# Patient Record
Sex: Male | Born: 1985 | Race: Black or African American | Hispanic: No | Marital: Married | Smoking: Never smoker
Health system: Southern US, Community
[De-identification: ages and names within clinical notes are randomized; demographics above are authoritative.]

## PROBLEM LIST (undated history)

## (undated) DIAGNOSIS — C801 Malignant (primary) neoplasm, unspecified: Secondary | ICD-10-CM

## (undated) DIAGNOSIS — M549 Dorsalgia, unspecified: Secondary | ICD-10-CM

## (undated) DIAGNOSIS — I10 Essential (primary) hypertension: Secondary | ICD-10-CM

## (undated) HISTORY — PX: KNEE SURGERY: SHX244

---
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
## 2005-01-14 ENCOUNTER — Emergency Department (HOSPITAL_COMMUNITY): Admission: EM | Admit: 2005-01-14 | Discharge: 2005-01-14 | Payer: Self-pay | Admitting: Emergency Medicine

## 2005-01-14 IMAGING — CR DG CHEST 2V
2 series · 2 of 2 positions shown · non-contrast
Comparison: none

CLINICAL DATA: Left flank pain on deep inspiration.  Low back pain.
 CHEST ? 2 VIEW:

[view not recorded (1 of 2)]
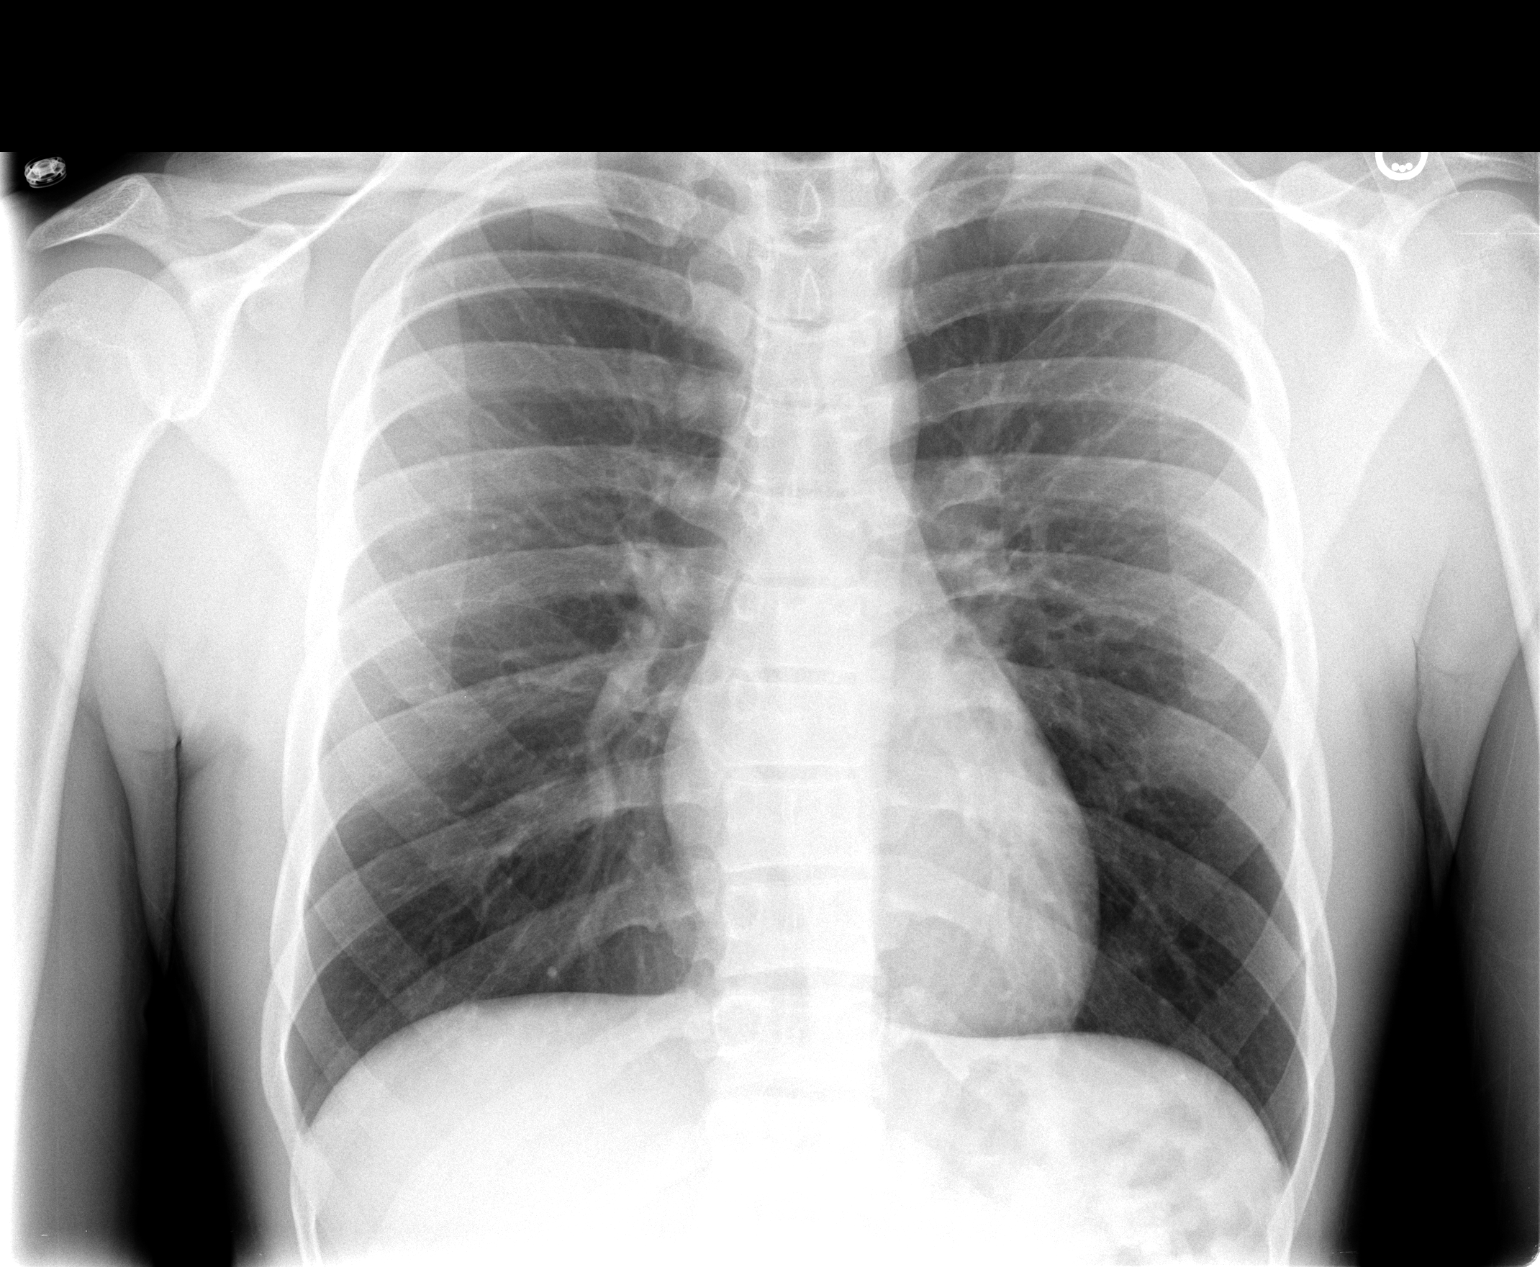

[view not recorded (2 of 2)]
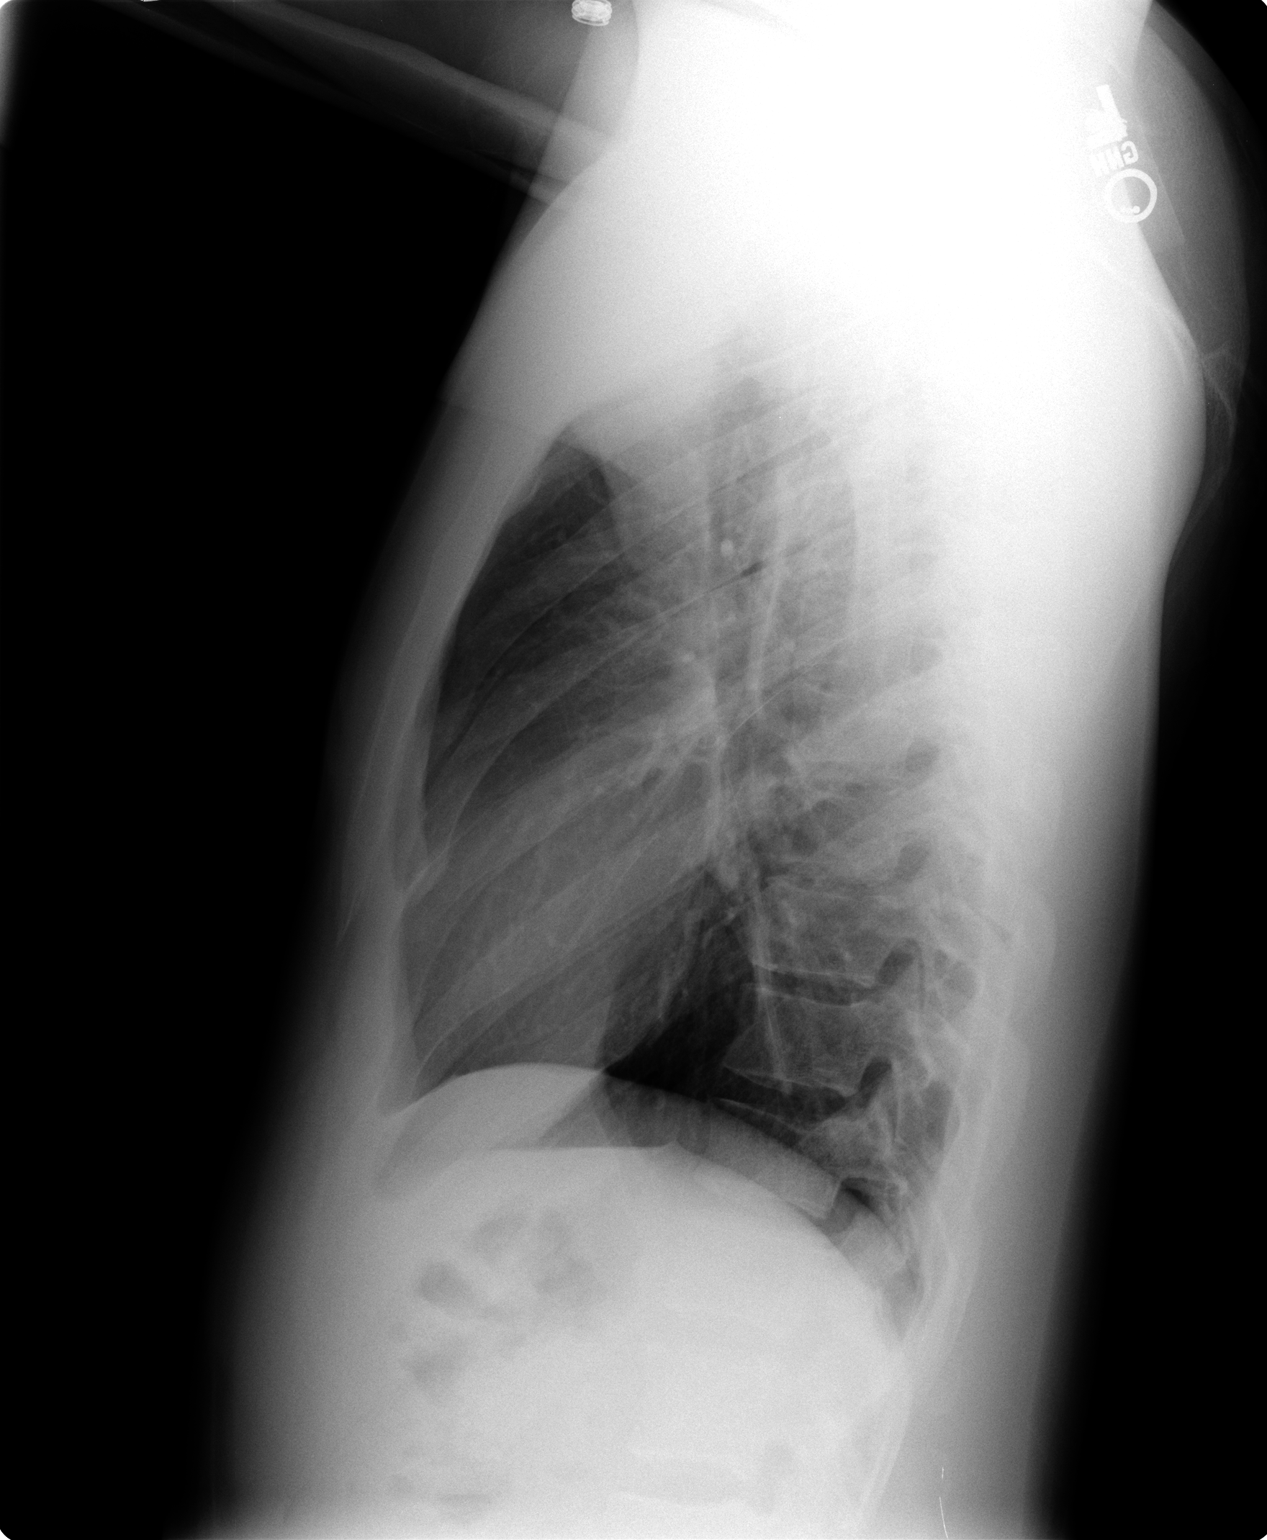

[2 of 2 positions shown; findings below may reference images not displayed]

FINDINGS: PA and lateral views of the chest are made without previous films for comparison and show no evidence of acute disease.  The heart, lungs, bony thorax, and soft tissues appear normal.
IMPRESSION: No evidence of active cardiopulmonary disease.

## 2009-03-14 ENCOUNTER — Emergency Department (HOSPITAL_COMMUNITY): Admission: EM | Admit: 2009-03-14 | Discharge: 2009-03-14 | Payer: Self-pay | Admitting: Emergency Medicine

## 2009-03-14 IMAGING — CR DG HAND COMPLETE 3+V*R*
3 series · 3 of 3 positions shown · non-contrast
Comparison: Right hand x-rays [DATE].

CLINICAL DATA: "Punched someone 1 day ago." Pain involving the
right index finger.

RIGHT HAND - COMPLETE 3+ VIEW [DATE]:

[x hand ap right]
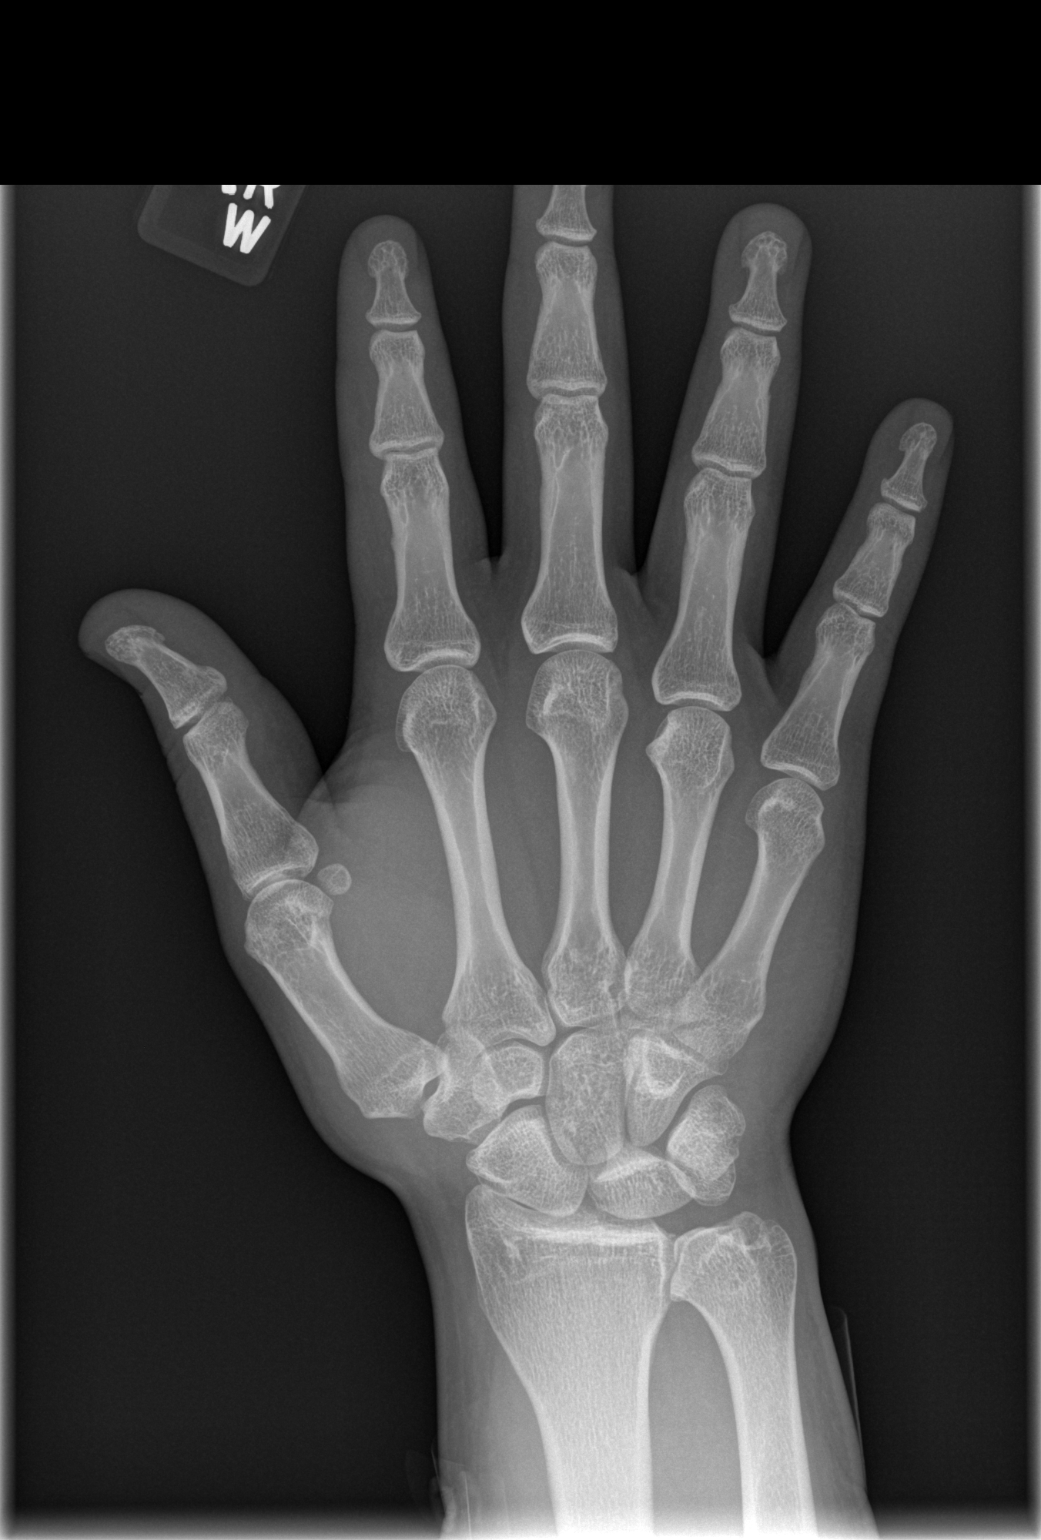

[x hand oblique right]
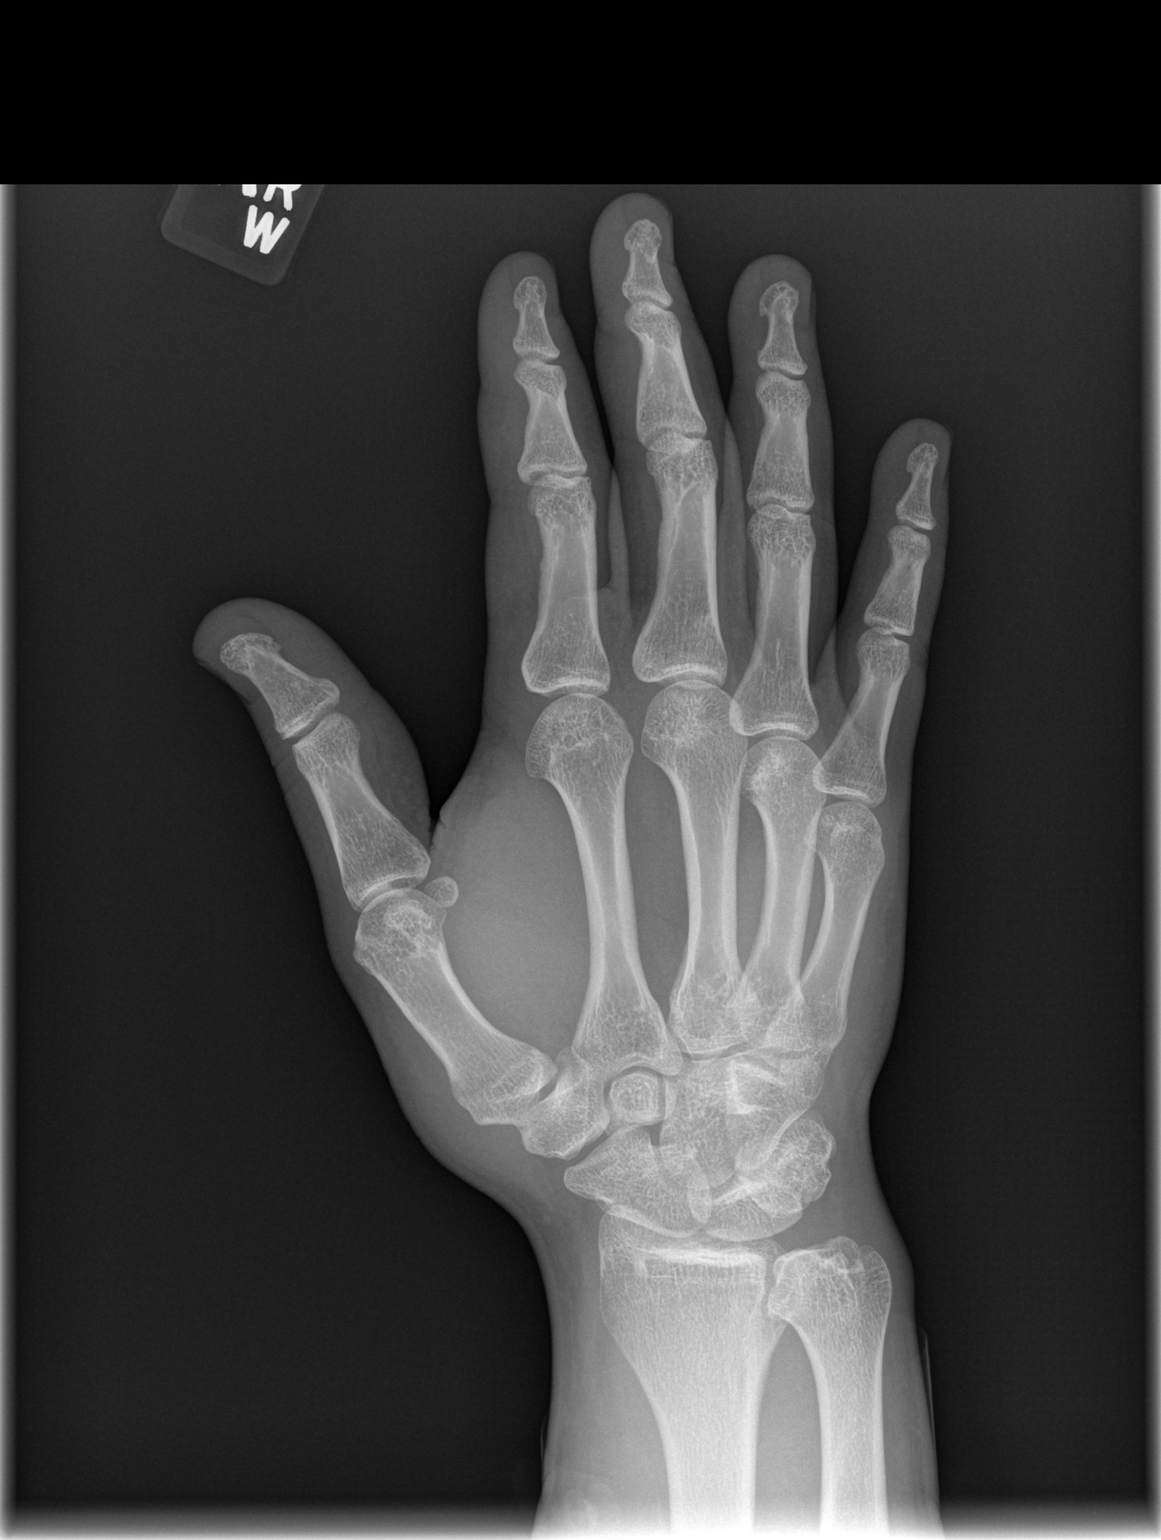

[x hand lat right]
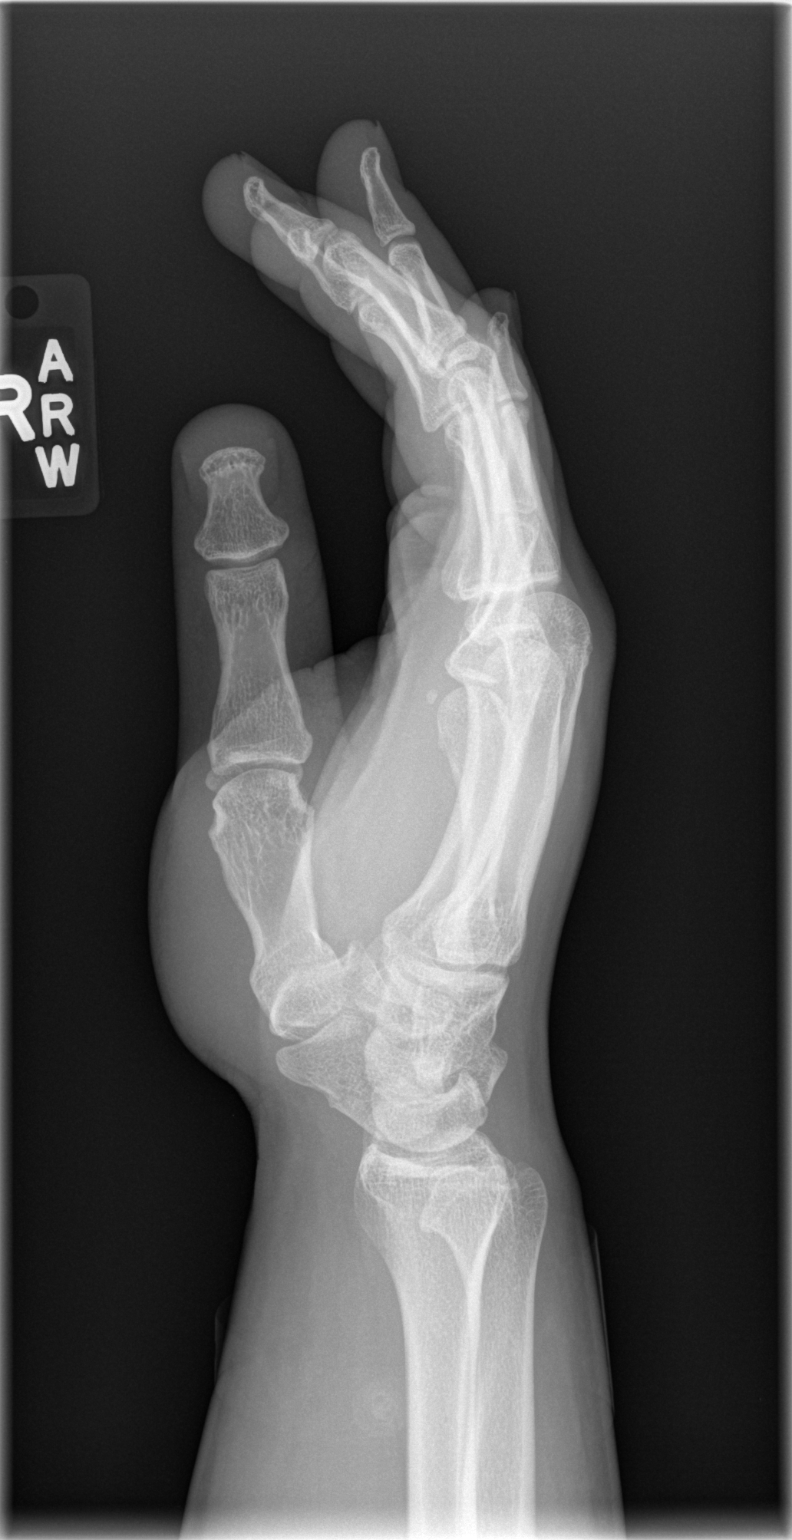

[3 of 3 positions shown; findings below may reference images not displayed]

FINDINGS: No evidence of acute or subacute fracture or dislocation.
Well-preserved joint spaces.  Well-preserved bone mineral density.
No intrinsic osseous abnormalities.
IMPRESSION: Normal examination.

## 2009-08-18 ENCOUNTER — Emergency Department (HOSPITAL_COMMUNITY): Admission: EM | Admit: 2009-08-18 | Discharge: 2009-08-19 | Payer: Self-pay | Admitting: Emergency Medicine

## 2009-08-19 IMAGING — CR DG WRIST COMPLETE 3+V*L*
4 series · 4 of 4 positions shown · non-contrast
Comparison: None

CLINICAL DATA: Pain and swelling.  Injured 4 months ago.

LEFT WRIST - COMPLETE 3+ VIEW

[x wrist pa left *]
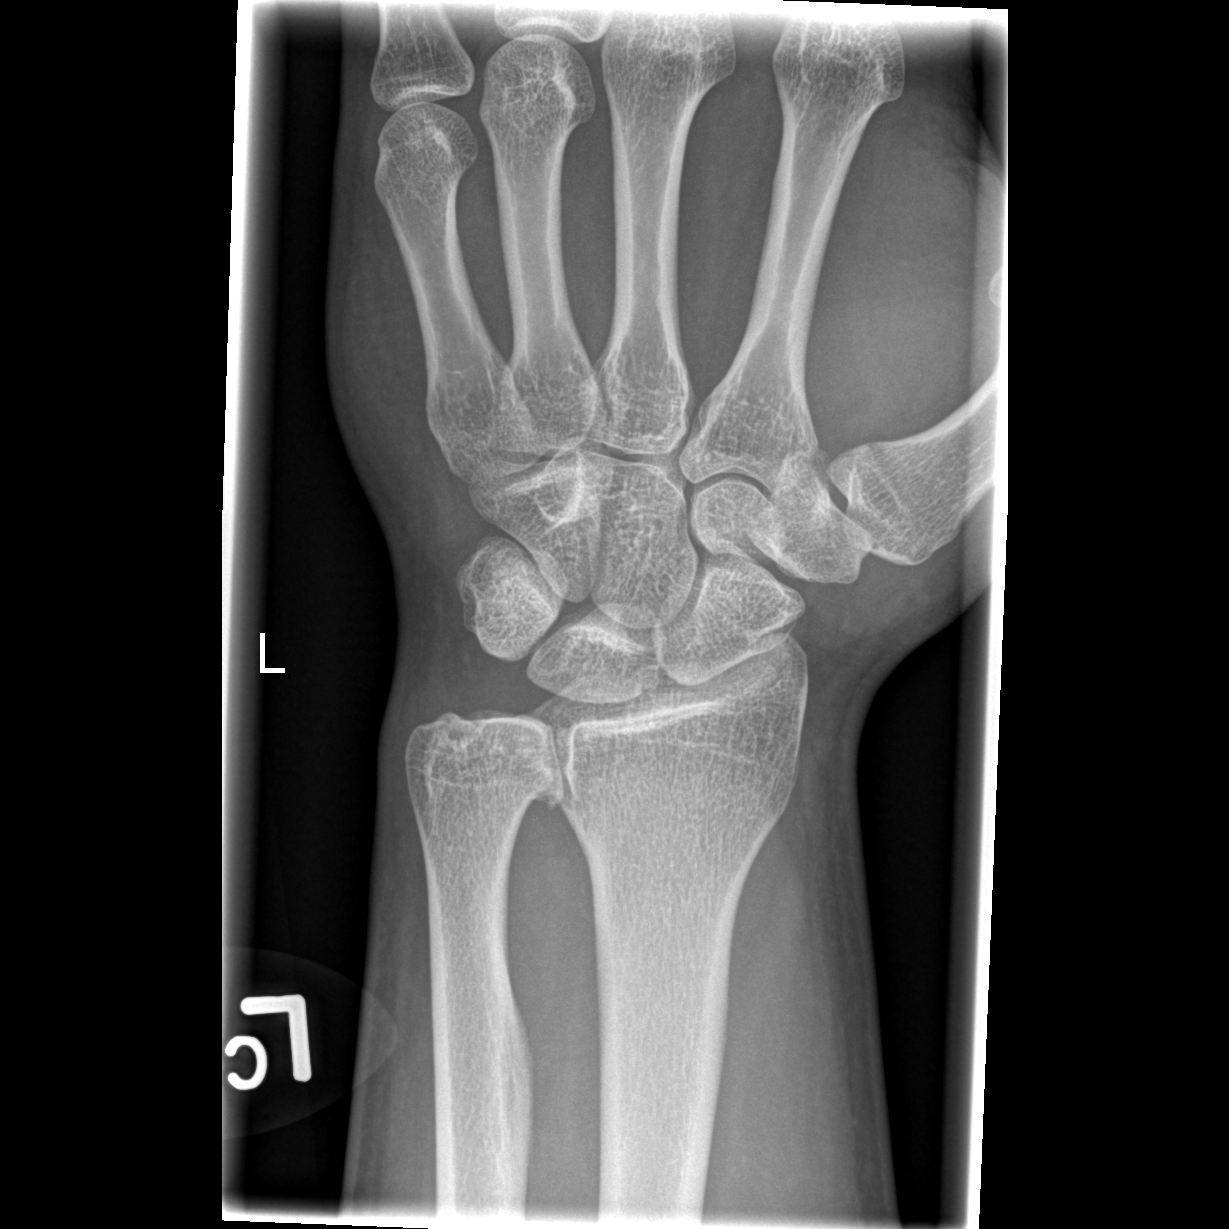

[x wrist obl left *]
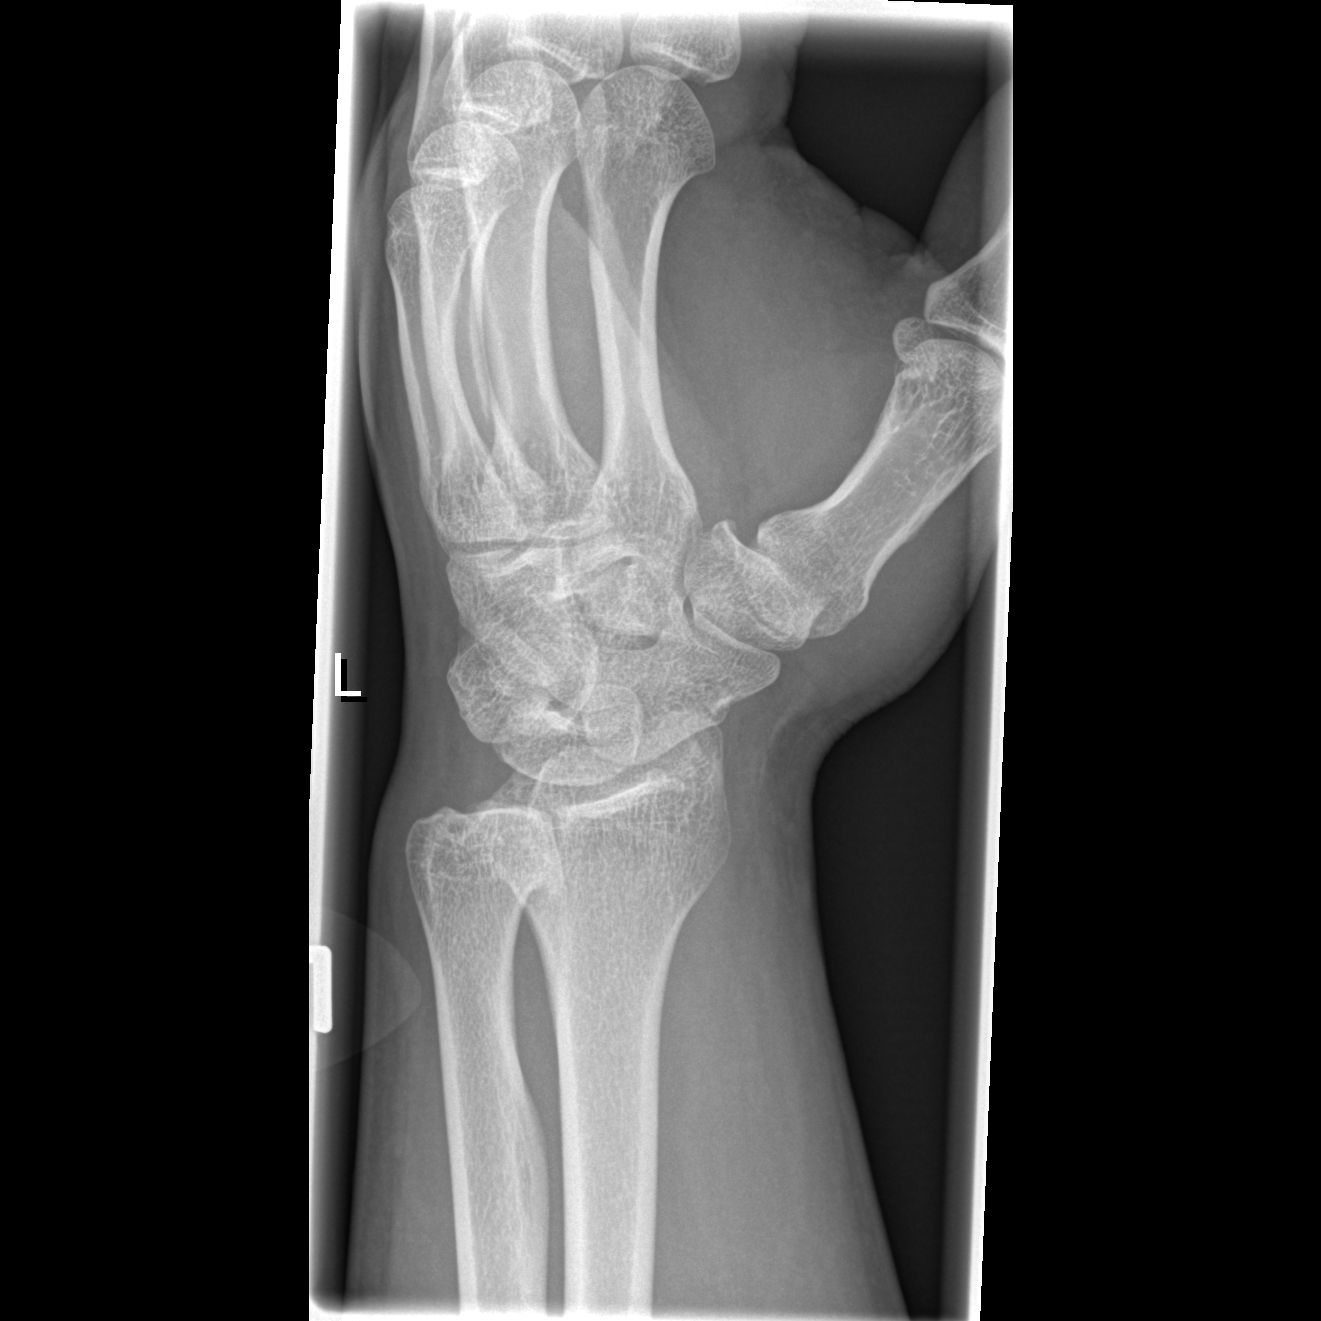

[x wrist lat left *]
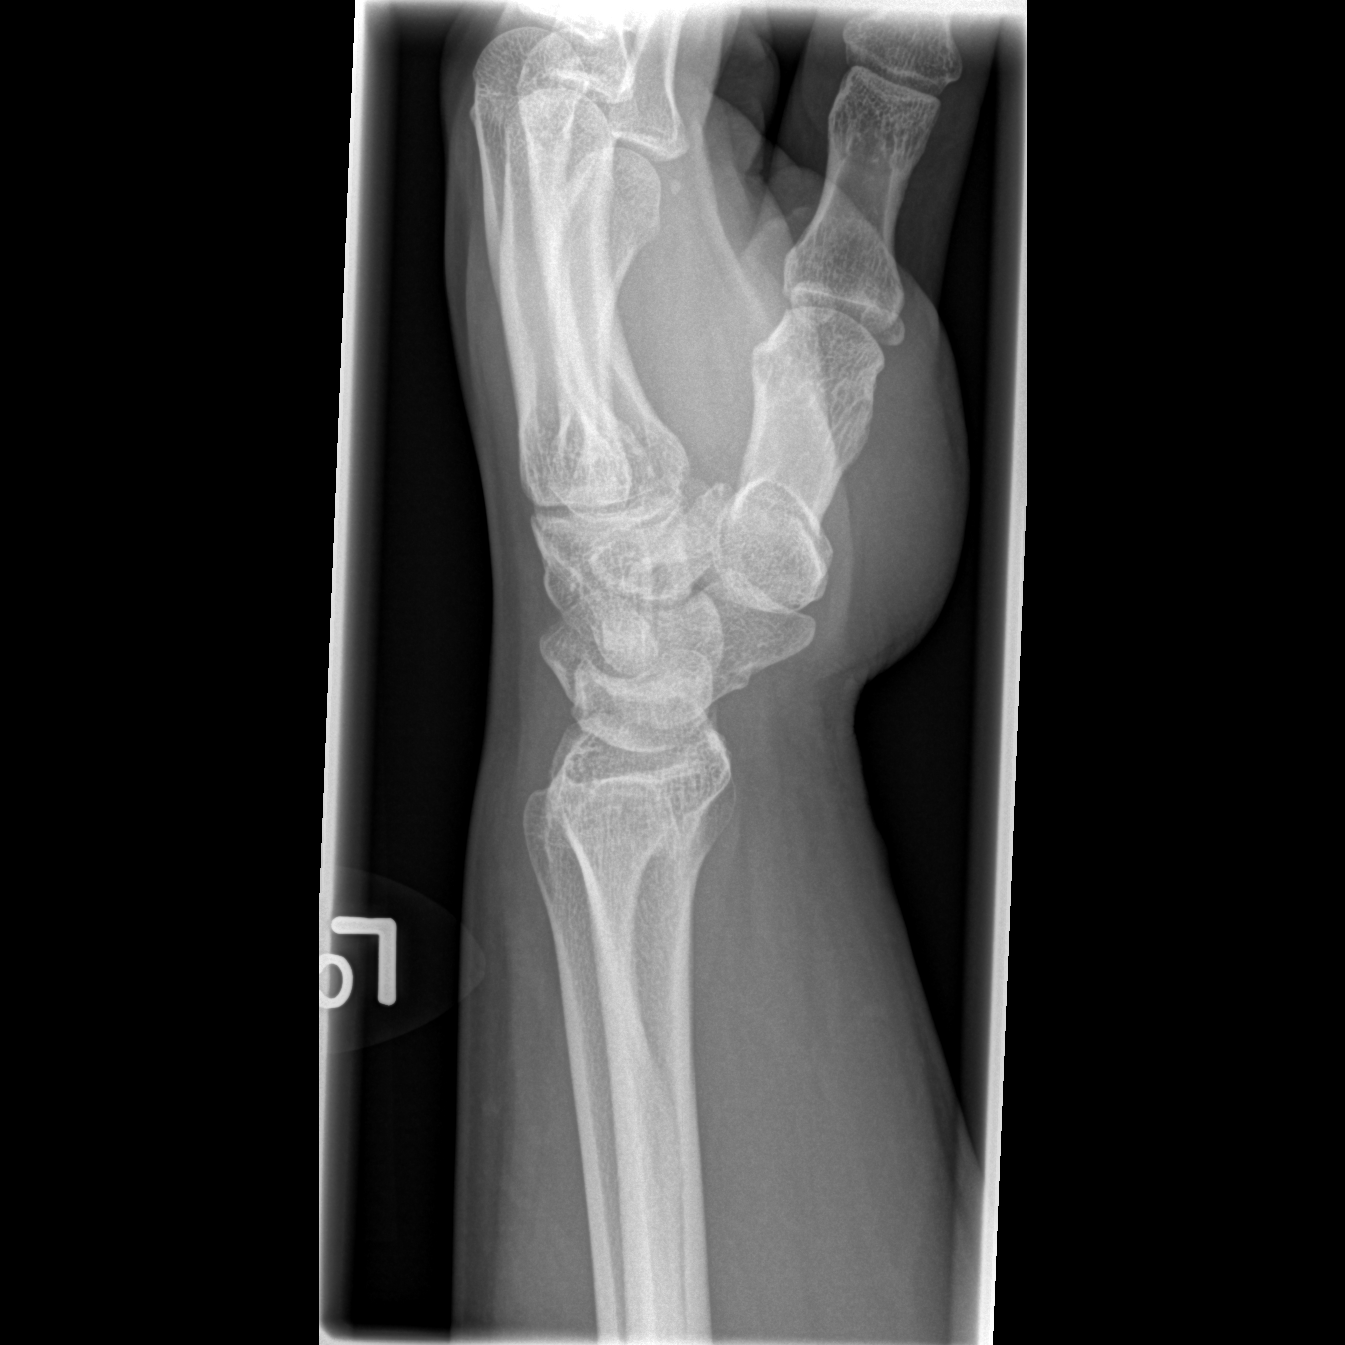

[x navicular *]
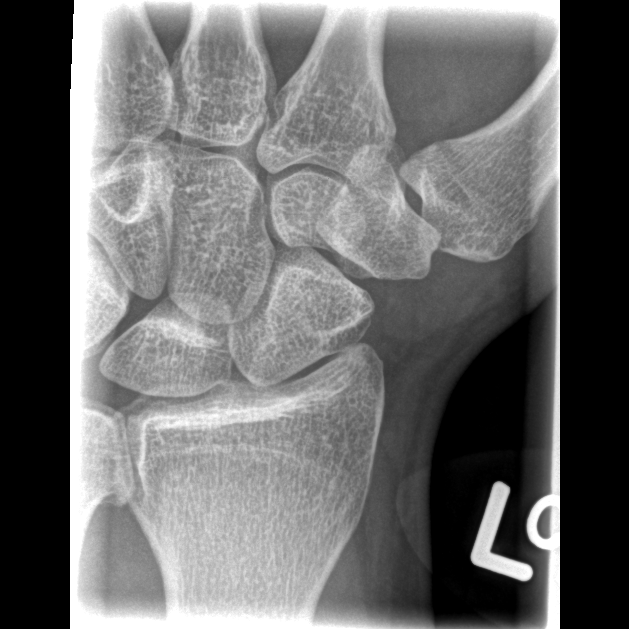

[4 of 4 positions shown; findings below may reference images not displayed]

FINDINGS: There is degenerative change at the distal radial ulnar
joint.  The ulnar styloid is hypoplastic.  There is no evidence of
radiocarpal degenerative change or carpal pathology.
IMPRESSION: Degenerative disease of the distal radial ulnar joint.  No acute
finding.

## 2010-03-28 ENCOUNTER — Ambulatory Visit (INDEPENDENT_AMBULATORY_CARE_PROVIDER_SITE_OTHER): Payer: Self-pay

## 2010-03-28 ENCOUNTER — Inpatient Hospital Stay (INDEPENDENT_AMBULATORY_CARE_PROVIDER_SITE_OTHER)
Admission: RE | Admit: 2010-03-28 | Discharge: 2010-03-28 | Disposition: A | Discharge status: Home / Self Care | Payer: Self-pay | Source: Ambulatory Visit | Attending: Family Medicine | Admitting: Family Medicine

## 2010-03-28 DIAGNOSIS — M549 Dorsalgia, unspecified: Secondary | ICD-10-CM

## 2010-03-28 DIAGNOSIS — R319 Hematuria, unspecified: Secondary | ICD-10-CM

## 2010-03-28 LAB — POCT URINALYSIS DIPSTICK
Bilirubin Urine: NEGATIVE
Glucose, UA: NEGATIVE mg/dL
Ketones, ur: NEGATIVE mg/dL
Protein, ur: NEGATIVE mg/dL
Specific Gravity, Urine: 1.02 (ref 1.005–1.030)

## 2010-03-28 IMAGING — CR DG ABDOMEN 1V
2 series · 2 of 2 positions shown · non-contrast
Comparison: None.

CLINICAL DATA: pain

ABDOMEN - 1 VIEW

[view not recorded (1 of 2)]
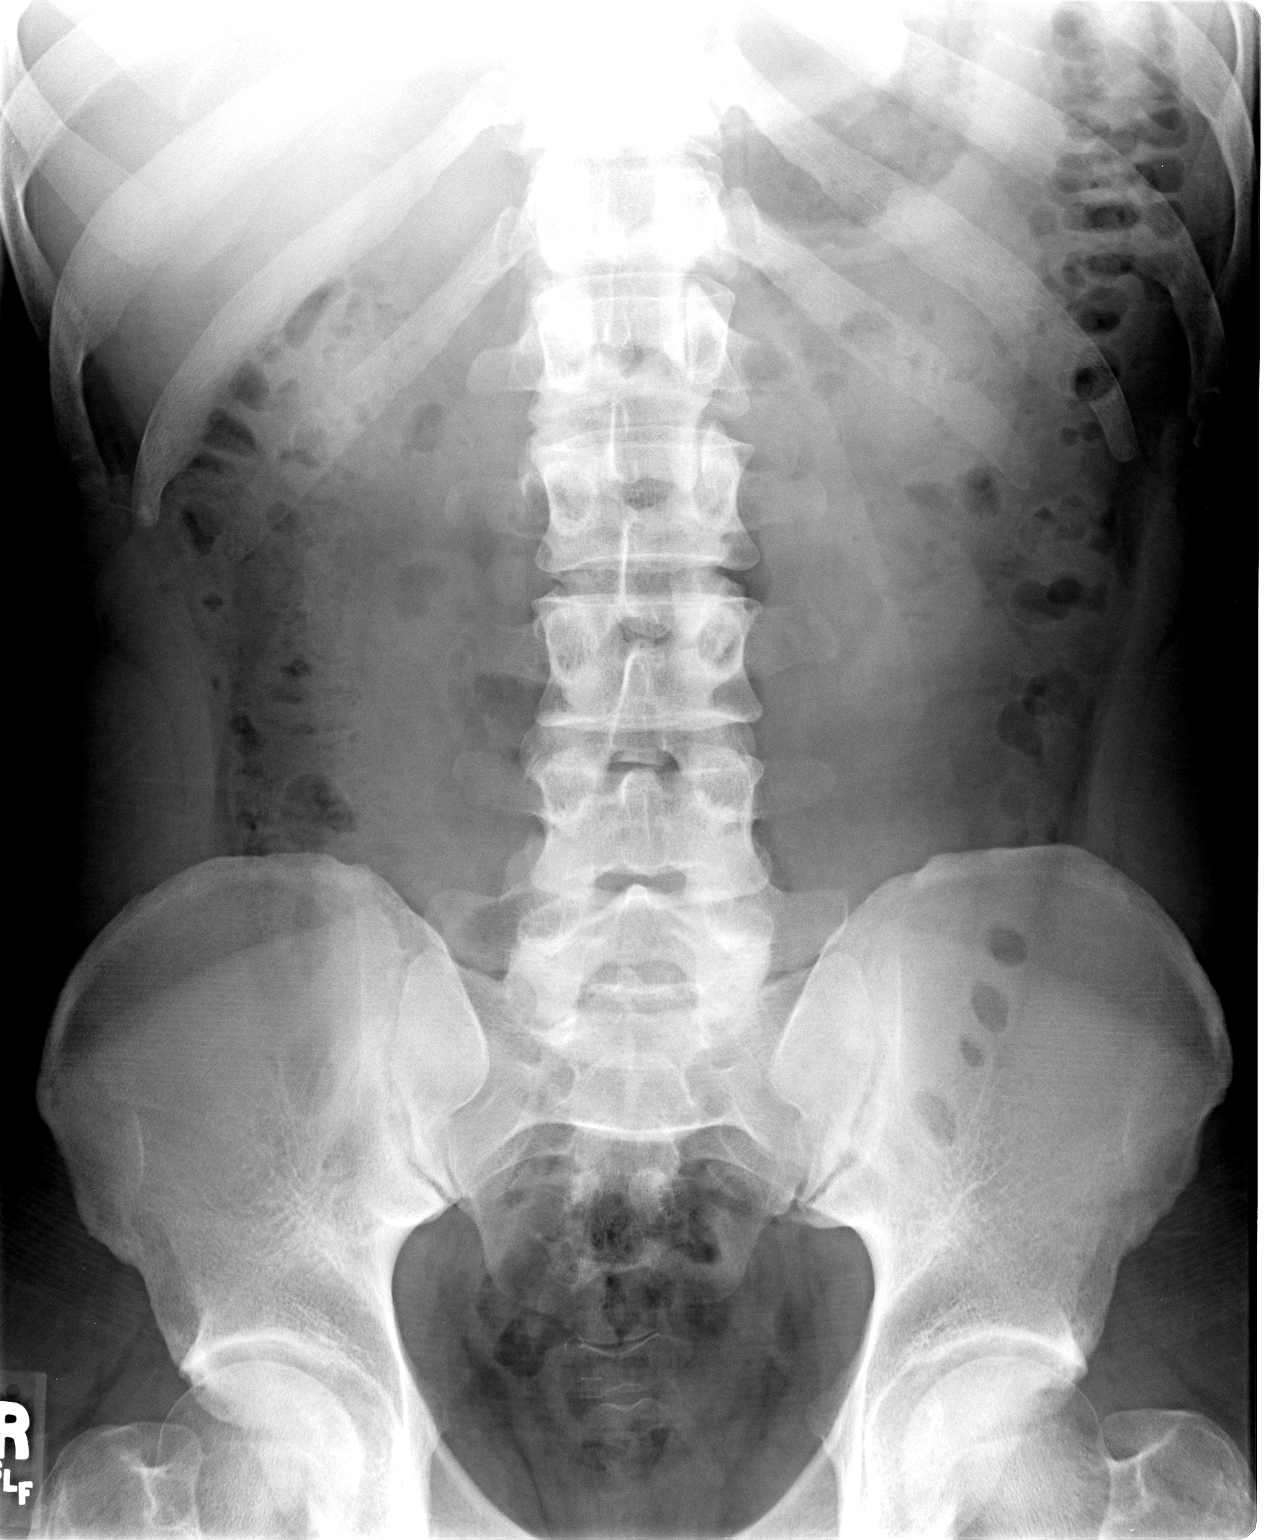

[view not recorded (2 of 2)]
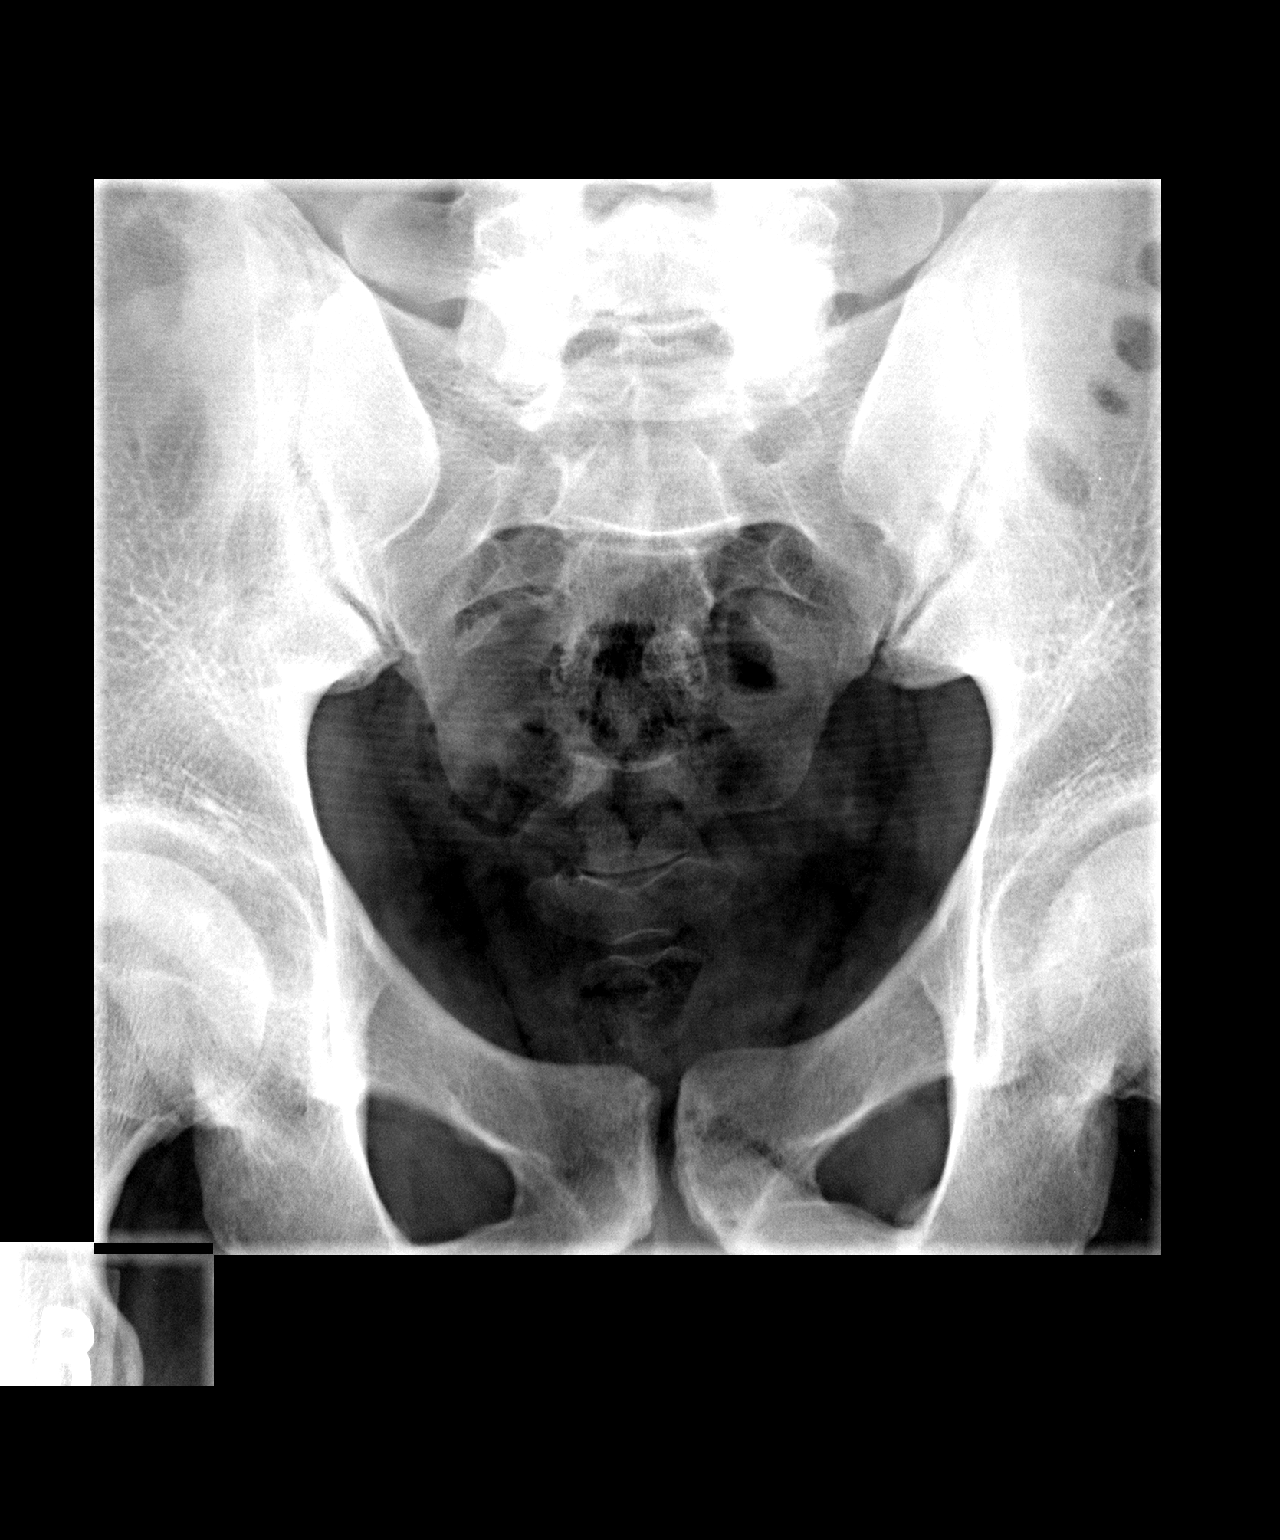

[2 of 2 positions shown; findings below may reference images not displayed]

FINDINGS: There is no evidence for gaseous bowel dilation to
suggest obstruction.  No unexpected abdominopelvic calcification.
Visualized bony structures are unremarkable.
IMPRESSION: Normal exam.

## 2010-06-13 NOTE — Op Note (Signed)
[facility]  Patient:    William Ali, William Ali Visit Number: [id number] MRN: [id number]          Service Type: Attending:  Vania Rea. Supple, M.D. Dictated by:   Vania Rea. Supple, M.D. Proc. Date: 03/17/01                             Operative Report  PREOPERATIVE DIAGNOSES: 1. 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 2. Symptomatic medial plica.  POSTOPERATIVE DIAGNOSES: 1. 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 2. symptomatic medial plica. 3. 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  PROCEDURE: 1. Right knee diagnostic arthroscopy. 2. Multiple small cartilaginous loose bodies which apparently originated from    the chondral defect on the medial aspect of the trochlear groove. 3. Partial synovectomy including division and removal of a thickened, enlarged    medial plica. 4. Debridement of osteochondritis dissecans lesion, medial femoral condyle    with erosion arthroplasty of the base of the defect. 5. Chondroplasty and abrasion arthroplasty of significant cartilaginous    defect, medial aspect of the femoral trochlear groove.  SURGEON:  Vania Rea. Supple, M.D.  ANESTHESIA:  LMA general.  TOURNIQUET TIME:  None was used.  ESTIMATED BLOOD LOSS:  Minimal.  DRAINS:  None.  HISTORY:  William Ali is a 25 year old male, who has had persistent difficulties with right knee pain, swelling, and mechanical symptoms with clinical examination showing an exquisitely tender medial plica as well as some tenderness over the medial joint line.  He has had recurring activity-related effusions.  Plain films show a lucent zone over the lateral aspect of the medial femoral condyle consistent with an osteochondritis dissecans lesion, and MRI scan confirms edema in the underlying bone.  Due to his persistent pain and functional limitations, he is brought to the  operating room at this time for planned right knee arthroscopy.  Preoperatively D.J. and his mother were was counseled on treatment options as well as risks versus benefits thereof.  Possible complications of bleeding, infection, neurovascular injury, DVT, PE, as well as persistence of pain and potential for anesthetic complications were all reviewed.  They understand and accept and agree with our planned procedure.  DESCRIPTION OF PROCEDURE:  After undergoing routine preoperative evaluation, the patient was placed supine on the operating room and underwent smooth induction of LMA general anesthesia.  Tourniquet was applied to the right thigh but no inflated.  The right leg was placed in the leg holder and sterilely prepped and draped in the standard fashion.  He did receive prophylactic antibiotics.  Standard portals were established, and diagnostic arthroscopy was performed.  On entrance into the suprapatellar pouch, there wee several small cartilaginous loose bodies floating through within the joint, and these were all evacuated through the scope cannula.  The lateral gutter was clear.  Medial gutters showed several chondral loose bodies which were removed.  He also had a very large medial plica which had been abrading across the osteoarticular junction of the medial femoral condyle and medial trochlear groove.  The cartilage was markedly friable and degenerative in this area, and there was also a longitudinal fissure over the central aspect of the trochlear groove medially with small area of full-thickness cartilage loss in the center of this.  We introduced a probe, and there were several large flaps and necrotic cartilage, and we proceeded with an aggressive  chondroplasty in the area which exposed subchondral bone in a region approximately .5 cm in width and 2 cm in length.  We introduced a 3.5 spherical bur and created several punctate debridements through the thickened subchondral  bone into bleeding bone beneath.  Final debridement with the shaver removed any residual unstable tissue in the region.  We then completed excision of the medial plica back to its capsular margins.  The intercondylar notch had an intact ligamentum mucosum which was divided and excised to improve visualization. The ACL was stable to probing with a negative intraoperative Lachman.  In the medial compartment, the meniscus was stable to probing.  There was evidence for some fissuring of the articular cartilage over the most posterior aspect of the medial femoral condyle, and this is the area that had been noted to have the OCD on both plain radiographs and the MRI scan.  A probe was introduced in this region, and we did find that there was actually a complete defect in the cartilage with synovial fluid dissecting beneath the cartilage and making this somewhat of a ballotable section of cartilage and underlying necrotic bone.  We went ahead and unroofed the OC defect which measured approximately 1.5 x 1.5 cm.  The cap of bone and cartilage was removed with a pituitary rongeur and shaver.  The underlying bone was then gently debrided with a bur to bleeding base.  Shiv was then used for final contouring and removal of residual necrotic tissue.  Our attention was then directed laterally where the meniscus was probed and noted to be stable.  The articular surfaces were in excellent condition laterally as well.  At this point, final inspection and irrigation of the joint was then performed.  Normal patellar tracking was found.  Fluid and instruments were then removed.  Then 20 cc of Marcaine with epinephrine and 4 mg of morphine was instilled into the joint, and an additional 10 cc of Marcaine with epinephrine was instilled about the portals.  The portals were closed with Steri-Strips.  A bulky dry dressing was then wrapped about the knee.  The leg was wrapped from foot to thigh with an ACE bandage.   The patient was then extubated and taken to the recovery room in stable condition. ictated by:   Vania Rea. Supple, M.D.  Attending:  Vania Rea. Supple, M.D. DD:  03/17/01 TD:  03/17/01 Job: [id number]

## 2010-07-14 IMAGING — CR DG LUMBAR SPINE COMPLETE 4+V
5 series · 5 of 5 positions shown · non-contrast
Comparison: None.

CLINICAL DATA: 2 weeks of low back pain.

LUMBAR SPINE - COMPLETE 4+ VIEW

[view not recorded (1 of 5)]
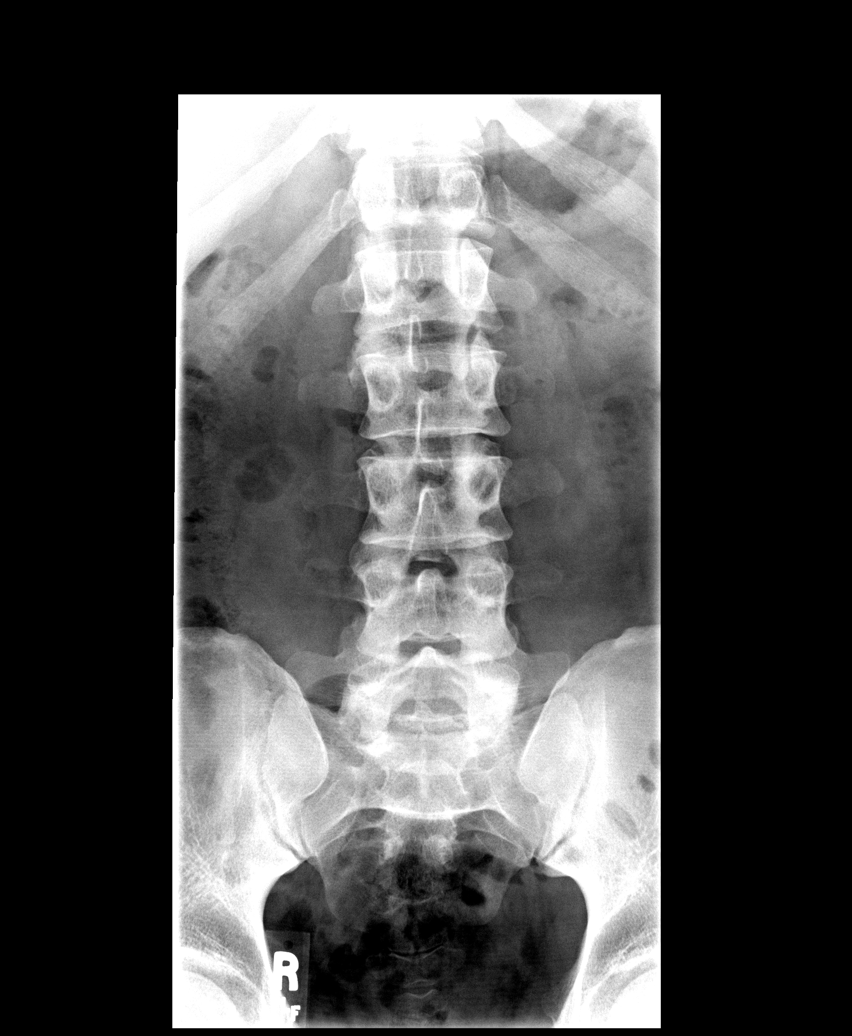

[view not recorded (2 of 5)]
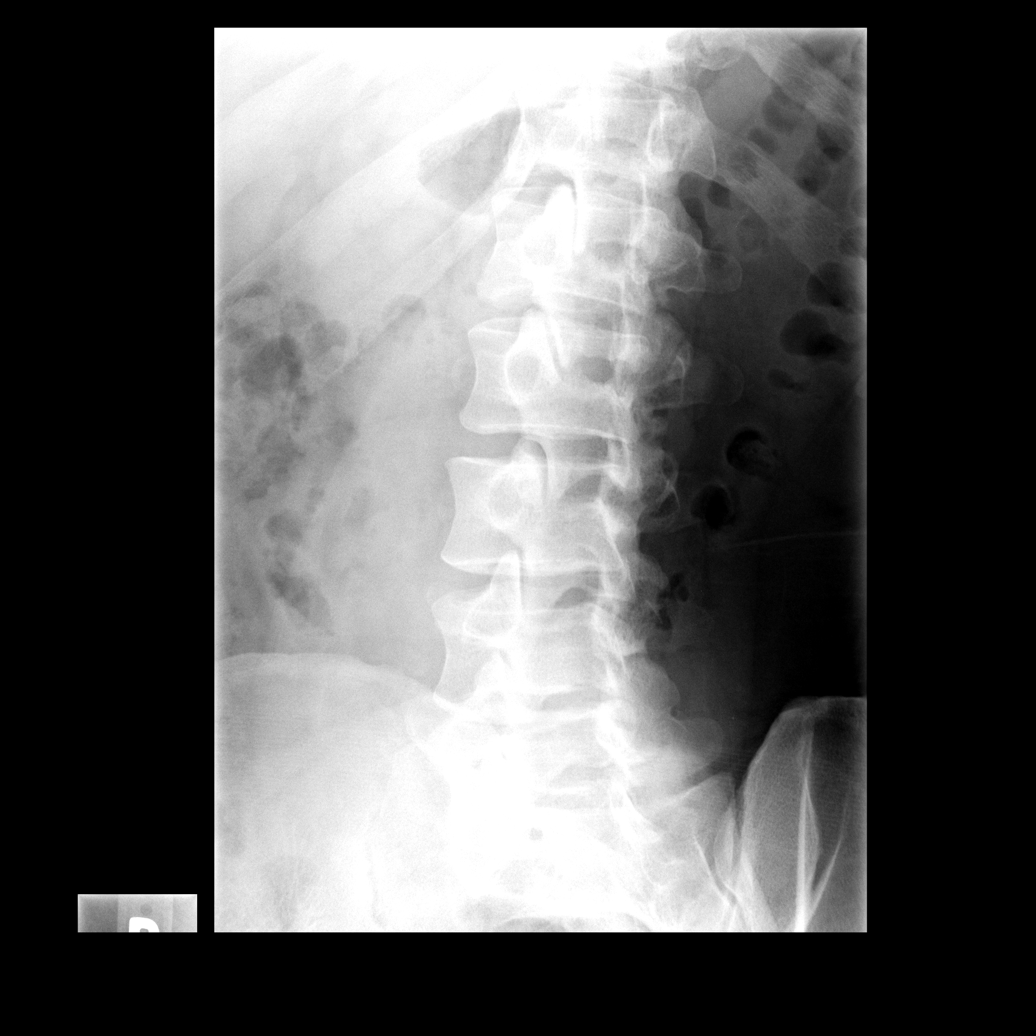

[view not recorded (3 of 5)]
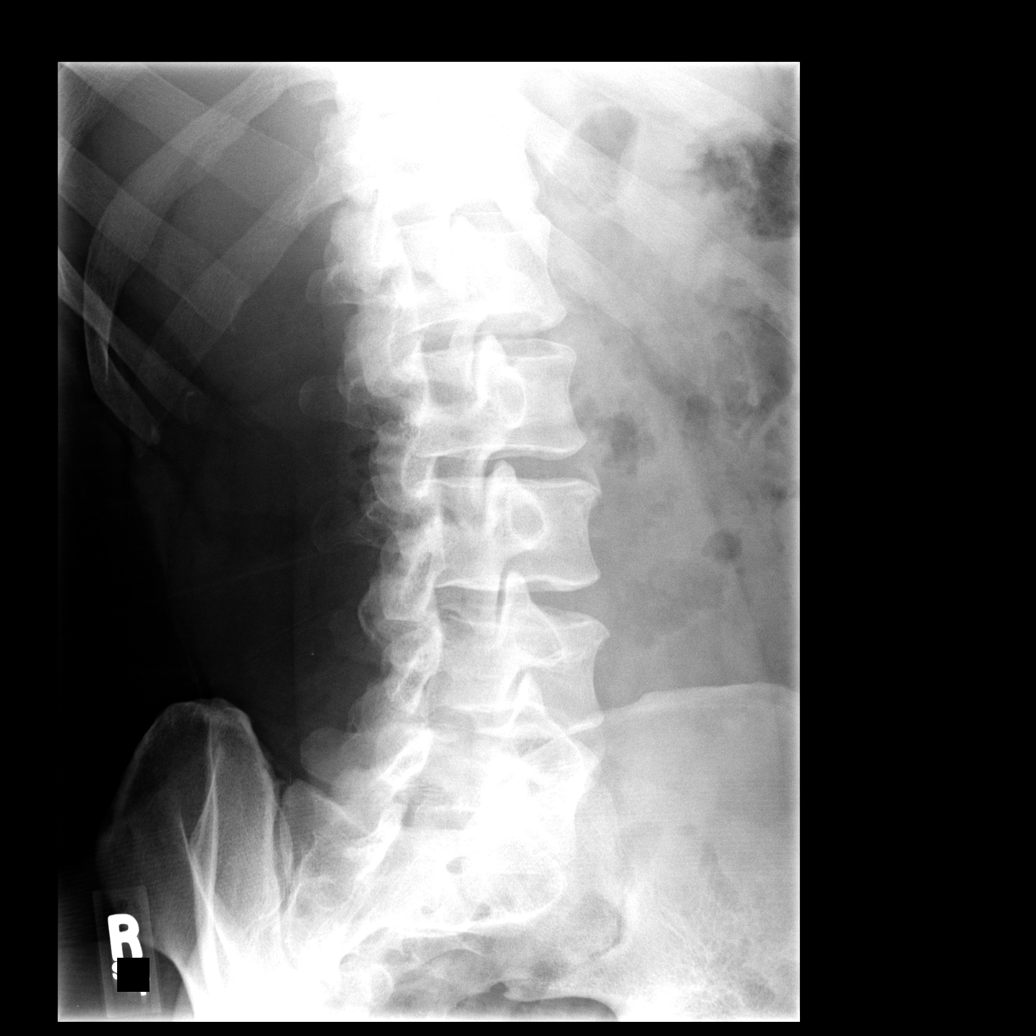

[view not recorded (4 of 5)]
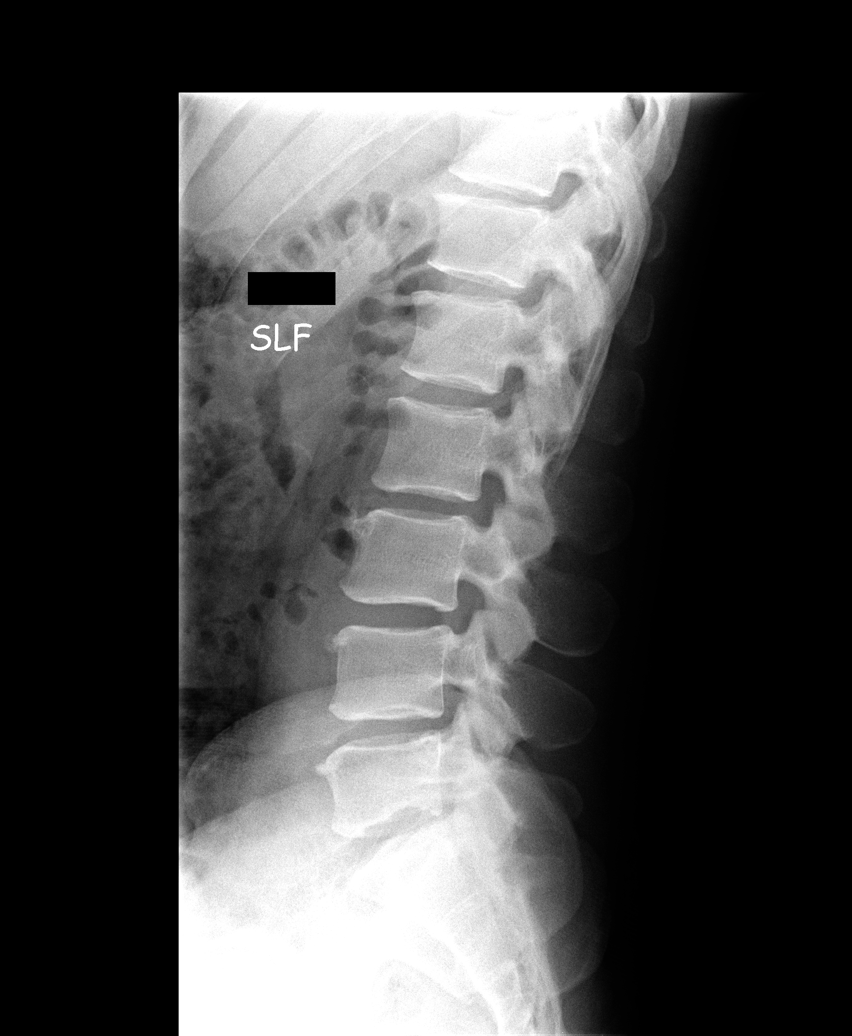

[view not recorded (5 of 5)]
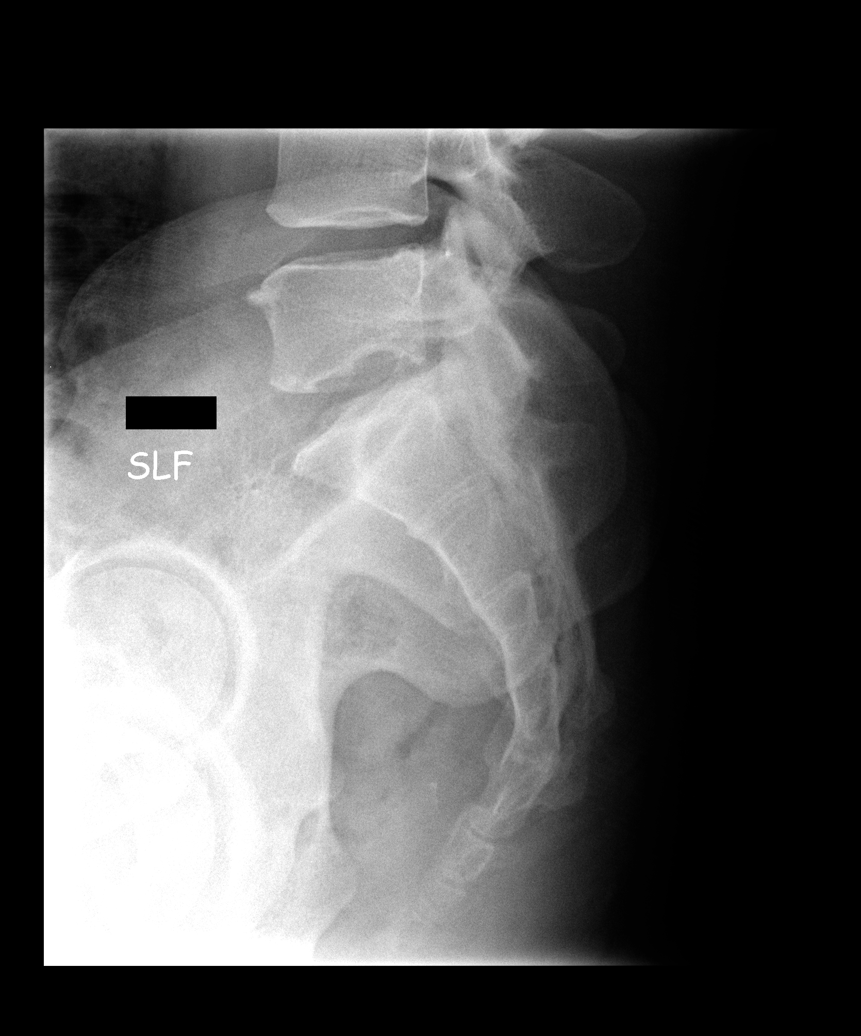

[5 of 5 positions shown; findings below may reference images not displayed]

FINDINGS: There is mild levoconvex curvature which is probably
positional.  Vertebral body height is normal.  Intervertebral disc
spaces are preserved.  Lumbosacral junction appears within normal
limits.  Small calcifications are present at L3, L4 and L5,
compatible with developing syndesmophytes.
IMPRESSION: No acute osseous abnormality.

## 2010-12-05 DIAGNOSIS — X500XXA Overexertion from strenuous movement or load, initial encounter: Secondary | ICD-10-CM | POA: Insufficient documentation

## 2010-12-05 DIAGNOSIS — M25579 Pain in unspecified ankle and joints of unspecified foot: Secondary | ICD-10-CM | POA: Insufficient documentation

## 2010-12-05 DIAGNOSIS — I10 Essential (primary) hypertension: Secondary | ICD-10-CM | POA: Insufficient documentation

## 2010-12-05 DIAGNOSIS — S93409A Sprain of unspecified ligament of unspecified ankle, initial encounter: Secondary | ICD-10-CM | POA: Insufficient documentation

## 2010-12-05 DIAGNOSIS — M25519 Pain in unspecified shoulder: Secondary | ICD-10-CM | POA: Insufficient documentation

## 2010-12-06 ENCOUNTER — Emergency Department (HOSPITAL_COMMUNITY): Payer: Self-pay

## 2010-12-06 ENCOUNTER — Encounter: Payer: Self-pay | Admitting: *Deleted

## 2010-12-06 ENCOUNTER — Emergency Department (HOSPITAL_COMMUNITY)
Admission: EM | Admit: 2010-12-06 | Discharge: 2010-12-06 | Disposition: A | Discharge status: Home / Self Care | Payer: Self-pay | Attending: Emergency Medicine | Admitting: Emergency Medicine

## 2010-12-06 DIAGNOSIS — S93402A Sprain of unspecified ligament of left ankle, initial encounter: Secondary | ICD-10-CM

## 2010-12-06 DIAGNOSIS — M75101 Unspecified rotator cuff tear or rupture of right shoulder, not specified as traumatic: Secondary | ICD-10-CM

## 2010-12-06 HISTORY — DX: Essential (primary) hypertension: I10

## 2010-12-06 IMAGING — CR DG SHOULDER 2+V*R*
3 series · 3 of 3 positions shown · non-contrast
Comparison: None.

CLINICAL DATA: Right shoulder pain, status post football injury.

RIGHT SHOULDER - 2+ VIEW

[w shoulder external right]
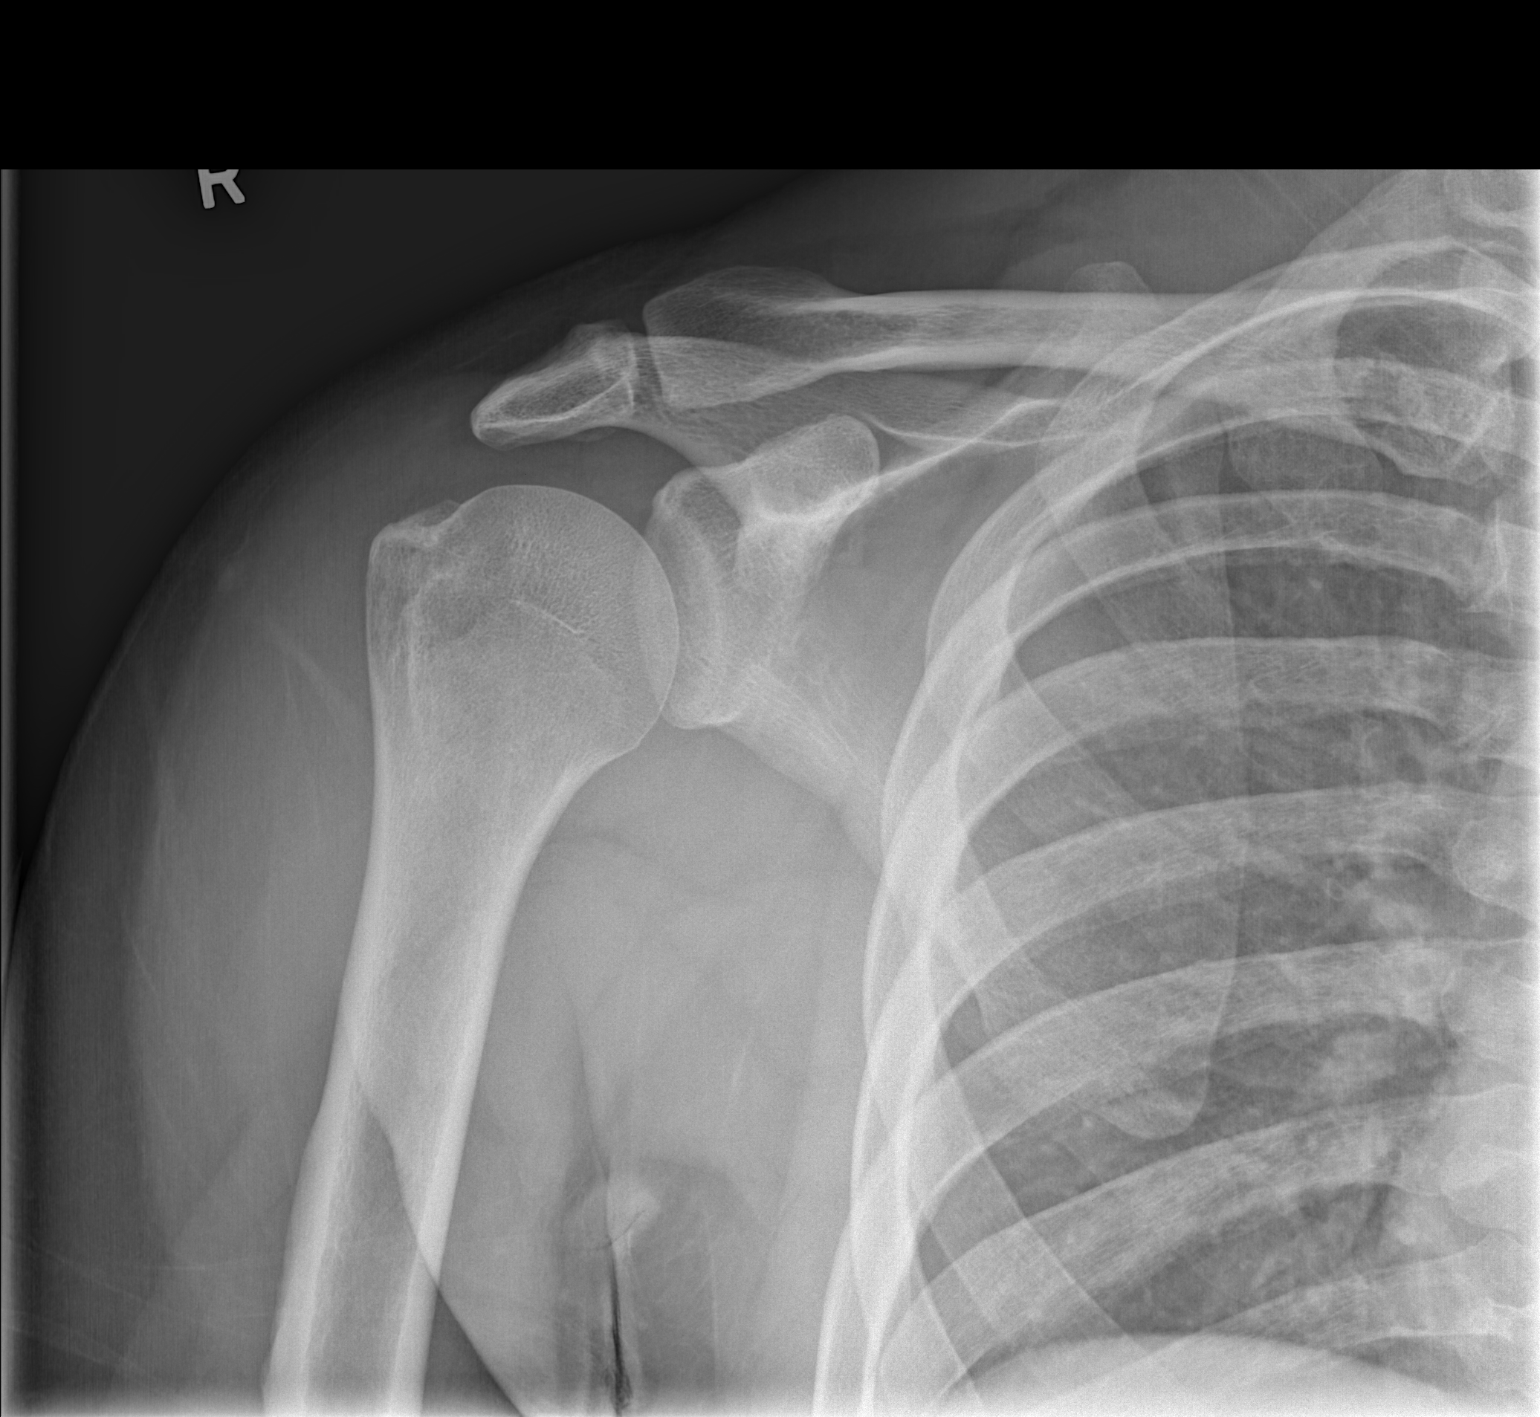

[w shoulder internal right]
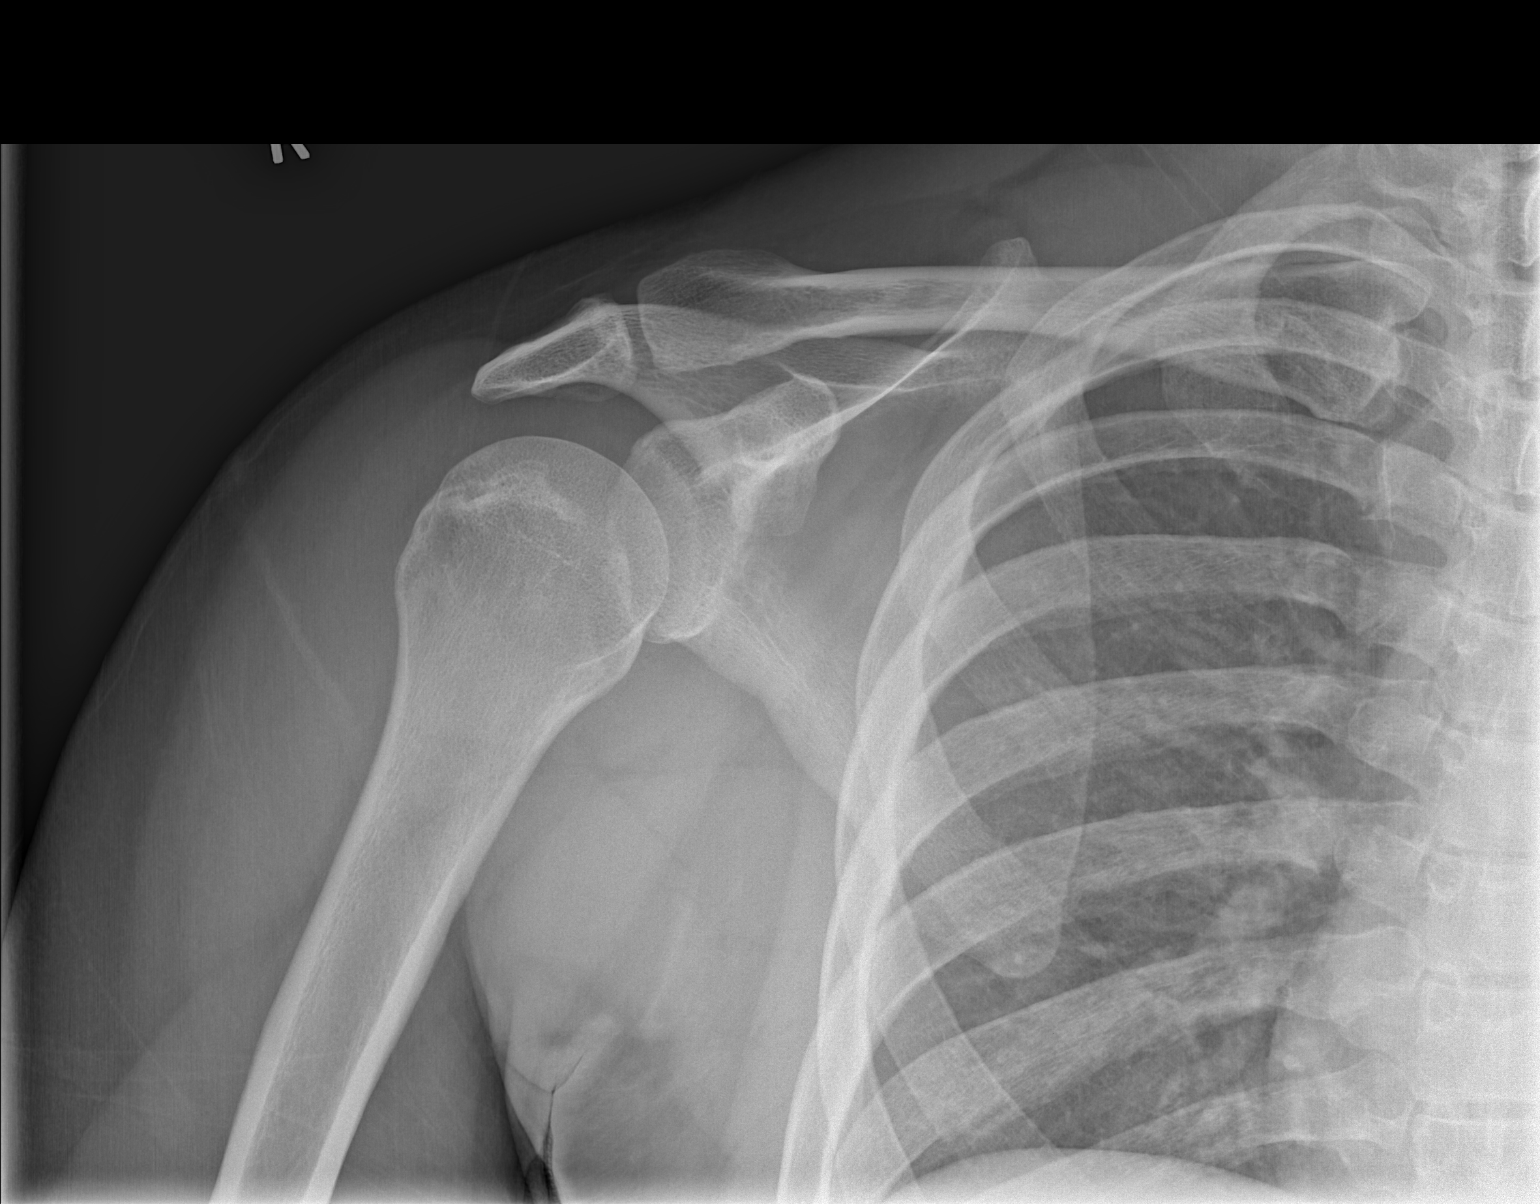

[w shoulder y-view right]
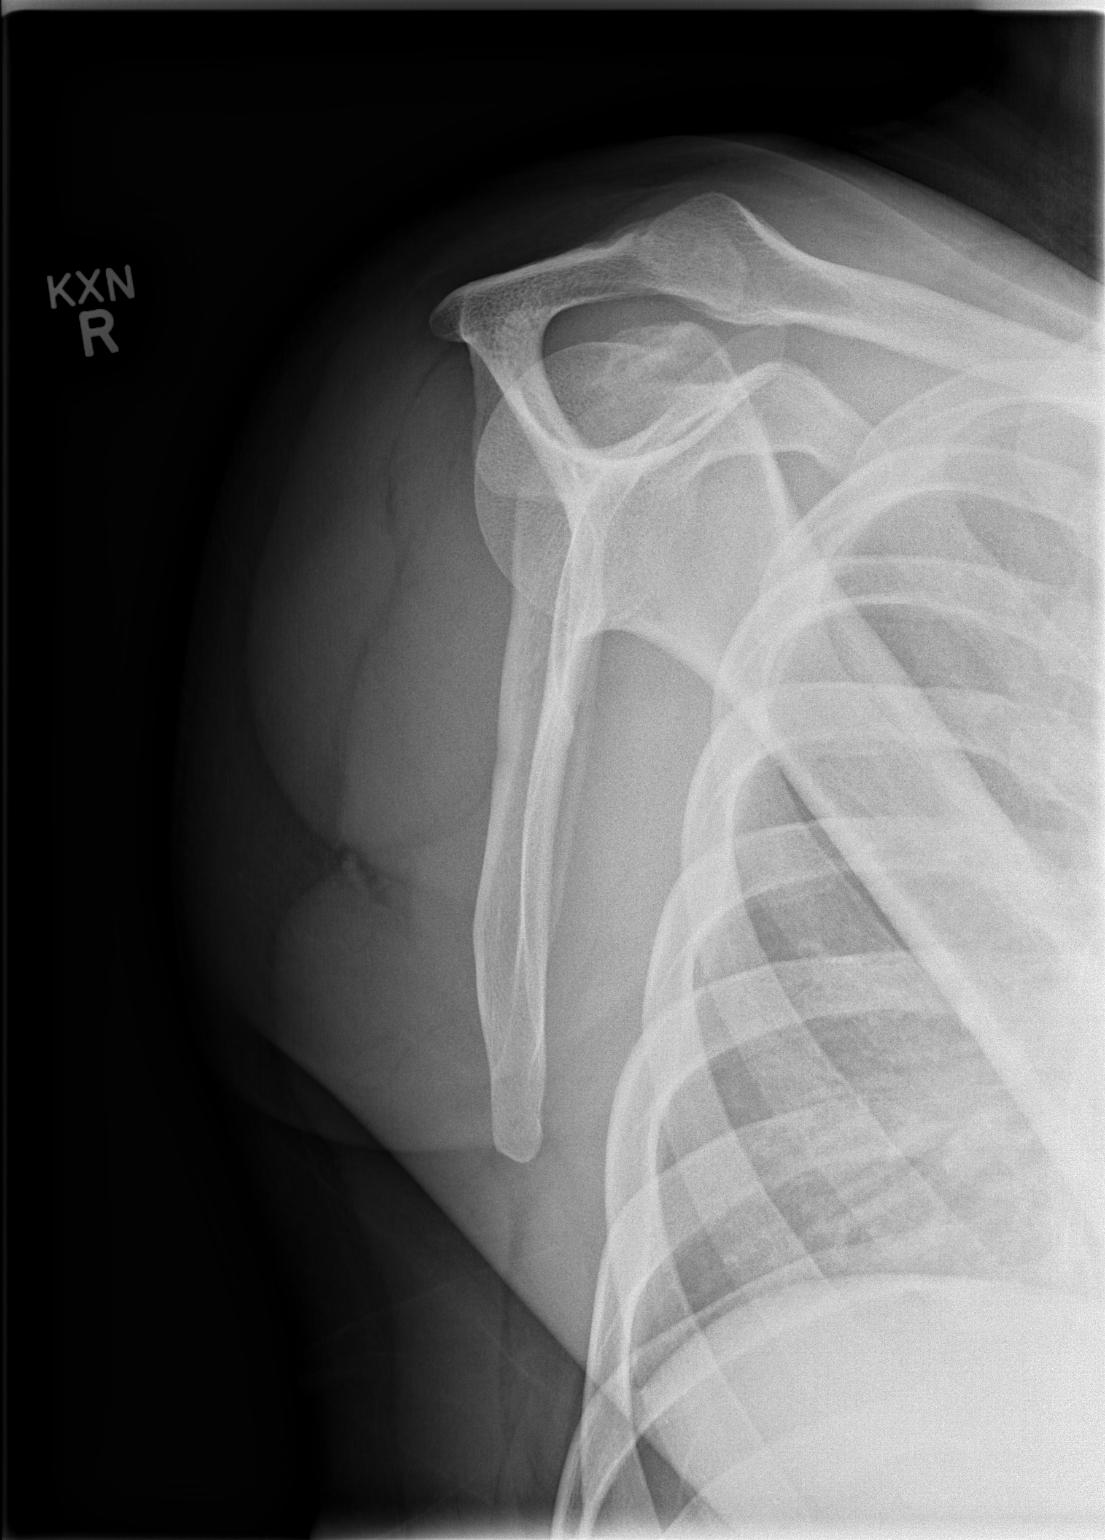

[3 of 3 positions shown; findings below may reference images not displayed]

FINDINGS: There is no evidence of fracture or dislocation.  The
right humeral head is seated within the glenoid fossa.  The
acromioclavicular joint is unremarkable in appearance.  No
significant soft tissue abnormalities are seen.  The visualized
portions of the right lung are clear.
IMPRESSION: No evidence of fracture or dislocation.  If shoulder pain persists,
MRI could be considered for further evaluation, to exclude
underlying internal derangement.

## 2010-12-06 IMAGING — CR DG ANKLE COMPLETE 3+V*L*
3 series · 3 of 3 positions shown · non-contrast
Comparison: None.

CLINICAL DATA: Twisted left ankle, with left ankle pain and
swelling.

LEFT ANKLE COMPLETE - 3+ VIEW

[x ankle ap left]
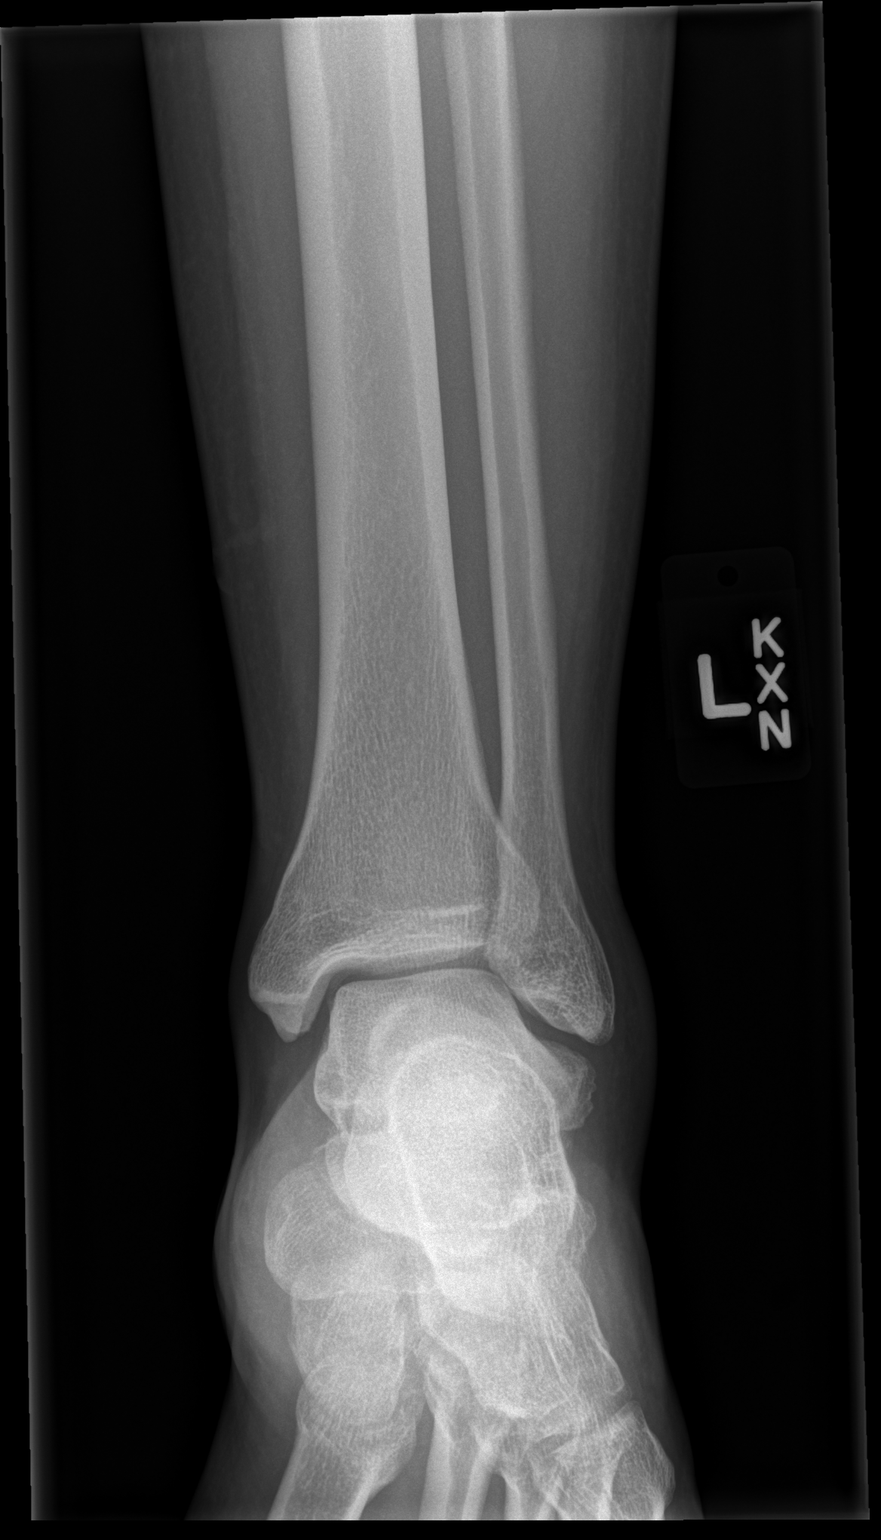

[x ankle obl left]
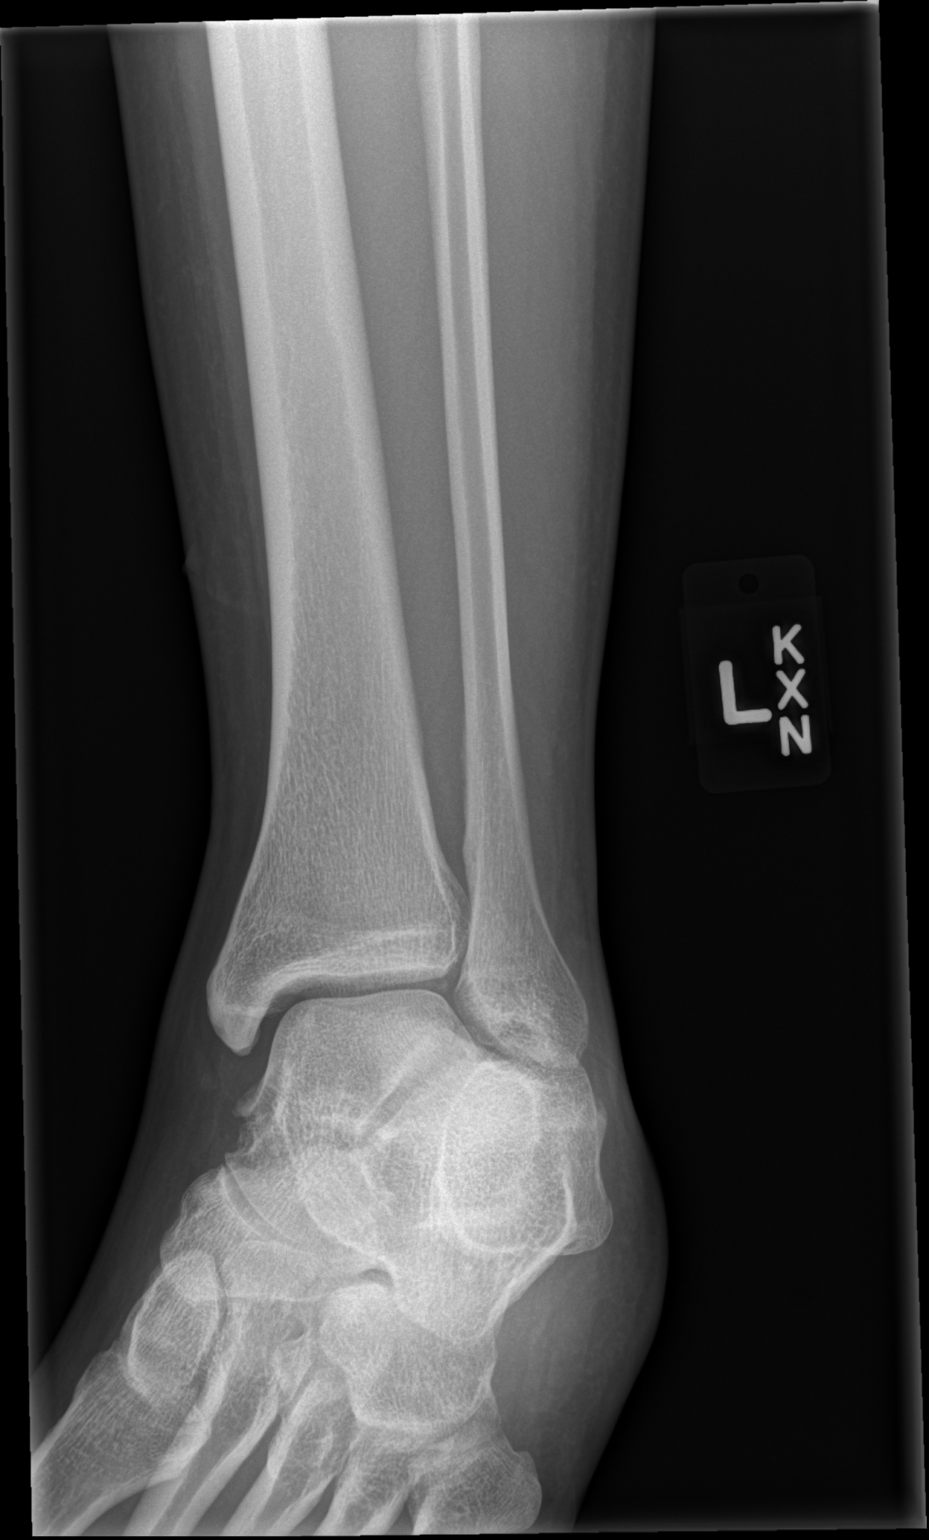

[x ankle lat left]
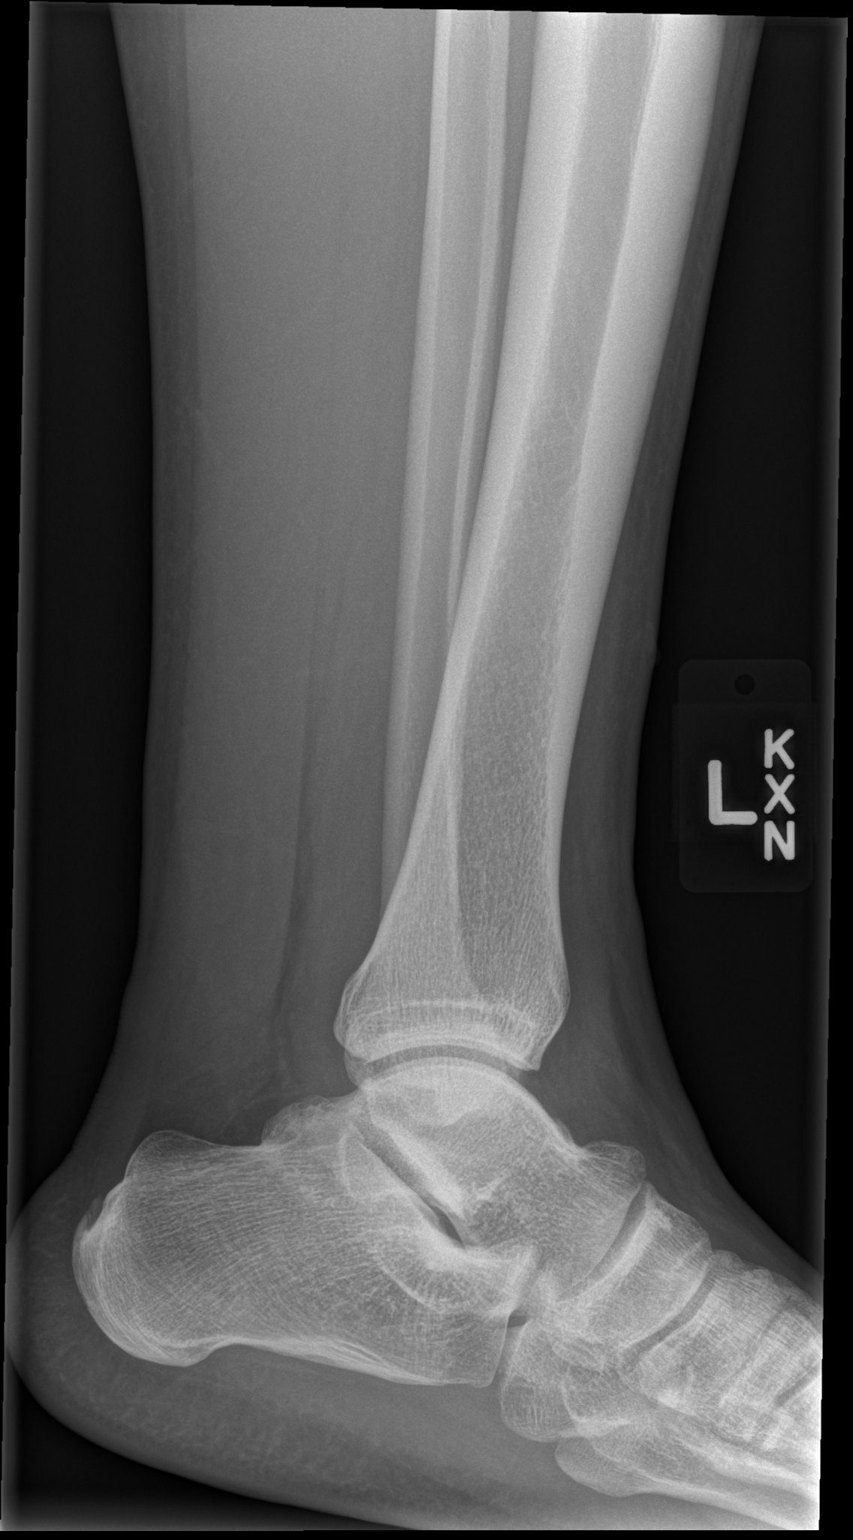

[3 of 3 positions shown; findings below may reference images not displayed]

FINDINGS: There is no evidence of fracture or dislocation.  The
ankle mortise is intact; the interosseous space is within normal
limits.  No talar tilt or subluxation is seen.

The joint spaces are preserved.  No significant soft tissue
abnormalities are seen.
IMPRESSION: No evidence of fracture or dislocation.

## 2010-12-06 MED ORDER — NAPROXEN SODIUM 550 MG PO TABS: 550.0000 mg | ORAL_TABLET | Freq: Two times a day (BID) | ORAL | Status: AC

## 2010-12-06 MED ORDER — IBUPROFEN 800 MG PO TABS
800.0000 mg | ORAL_TABLET | Freq: Once | ORAL | Status: AC
  Administered 2010-12-06: 800 mg via ORAL
  Filled 2010-12-06: qty 1

## 2010-12-06 NOTE — ED Notes (Signed)
Ortho at bedside for splinting.

## 2010-12-06 NOTE — ED Provider Notes (Signed)
History     CSN: [id number] Arrival date & time: 12/06/2010  1:19 AM   First MD Initiated Contact with Patient 12/06/10 [phone]      Chief Complaint  Patient presents with  . Ankle Pain    (Consider location/radiation/quality/duration/timing/severity/associated sxs/prior treatment) HPI He twisted his left ankle 6 days ago playing football. He placed a brace on it that he had from previous injury but states it continues to hurt. There is moderate pain in the left ankle worse with certain movements of the joint and to palpation. He has been ambulating on it. He has not sought medical care until this morning. He also complains of pain in his right shoulder, moderate to severe and worse with certain movements of the right shoulder. He is not aware of any injury to that shoulder and the pain came on gradually. He has been taking ibuprofen with some relief. He does have some blistering of the left lower leg where his leg was rubbed by the ankle brace.  Past Medical History  Diagnosis Date  . Hypertension     Past Surgical History  Procedure Date  . Knee surgery     Family History  Problem Relation Age of Onset  . Hypertension Mother   . Diabetes Father     History  Substance Use Topics  . Smoking status: Never Smoker   . Smokeless tobacco: Not on file  . Alcohol Use: No      Review of Systems  All other systems reviewed and are negative.    Allergies  Onion; Other; and Peanut-containing drug products  Home Medications   Current Outpatient Rx  Name Route Sig Dispense Refill  . IBUPROFEN 200 MG PO TABS Oral Take 400 mg by mouth every 6 (six) hours as needed. Pain       BP 138/72  Pulse 71  Temp(Src) 97.9 F (36.6 C) (Oral)  Resp 16  SpO2 98%  Physical Exam General: Well-developed, well-nourished male in no acute distress; appearance consistent with age of record HENT: normocephalic, atraumatic Eyes: normal para Neck: supple Heart: regular rate and  rhythm Lungs: normal respiratory effort and excursion Abdomen: soft; nondistended Extremities: No deformity; full range of motion but with pain on certain motions of the left ankle and right shoulder; mild edema of left lateral malleolus Neurologic: Awake, alert and oriented;motor function intact in all extremities and symmetric; no facial droop Skin: Warm and dry Psychiatric: Normal mood and affect    ED Course  Procedures (including critical care time)  Labs Reviewed - No data to display Dg Shoulder Right  12/06/2010  *RADIOLOGY REPORT*  Clinical Data: Right shoulder pain, status post football injury.  RIGHT SHOULDER - 2+ VIEW  Comparison: None.  Findings: There is no evidence of fracture or dislocation.  The right humeral head is seated within the glenoid fossa.  The acromioclavicular joint is unremarkable in appearance.  No significant soft tissue abnormalities are seen.  The visualized portions of the right lung are clear.  IMPRESSION: No evidence of fracture or dislocation.  If shoulder pain persists, MRI could be considered for further evaluation, to exclude underlying internal derangement.  Original Report Authenticated By: Tonia Ghent, M.D.   Dg Ankle Complete Left  12/06/2010  *RADIOLOGY REPORT*  Clinical Data: Twisted left ankle, with left ankle pain and swelling.  LEFT ANKLE COMPLETE - 3+ VIEW  Comparison: None.  Findings: There is no evidence of fracture or dislocation.  The ankle mortise is intact; the interosseous space is  within normal limits.  No talar tilt or subluxation is seen.  The joint spaces are preserved.  No significant soft tissue abnormalities are seen.  IMPRESSION: No evidence of fracture or dislocation.  Original Report Authenticated By: Tonia Ghent, M.D.     MDM   Symptoms and exam consistent with left ankle sprain for which we will apply an ASO. Shoulder pain is consistent with rotator cuff syndrome. Will refer to orthopedics for further evaluation and  treatment.      Hanley Seamen, MD 12/06/10 [phone]

## 2010-12-06 NOTE — Progress Notes (Signed)
Orthopedic Tech Progress Note Patient Details:  William Ali Jan 12, 1986 [id number] ASO      [name] 12/06/2010, 4:02 AM

## 2010-12-06 NOTE — ED Notes (Signed)
C/o L ankle pain, injured playing football, onset Sunday, also c/o R shoulder pain, onset monday morning. Some tingling & numbness in R arm/shoulder. Denies other sx. Had an ankle brace on, describes as twisted when tackled by the ankle, abrasion and blisters noted to high ankle.

## 2010-12-06 NOTE — ED Notes (Signed)
Pt denies any questions upon discharge. 

## 2011-01-27 DEATH — deceased

## 2011-04-28 ENCOUNTER — Emergency Department (INDEPENDENT_AMBULATORY_CARE_PROVIDER_SITE_OTHER)
Admission: EM | Admit: 2011-04-28 | Discharge: 2011-04-28 | Disposition: A | Discharge status: Home / Self Care | Payer: Self-pay | Source: Home / Self Care | Attending: Family Medicine | Admitting: Family Medicine

## 2011-04-28 ENCOUNTER — Encounter (HOSPITAL_COMMUNITY): Payer: Self-pay | Admitting: Emergency Medicine

## 2011-04-28 DIAGNOSIS — R319 Hematuria, unspecified: Secondary | ICD-10-CM

## 2011-04-28 HISTORY — DX: Dorsalgia, unspecified: M54.9

## 2011-04-28 LAB — POCT URINALYSIS DIP (DEVICE)
Bilirubin Urine: NEGATIVE
Glucose, UA: NEGATIVE mg/dL
Specific Gravity, Urine: 1.02 (ref 1.005–1.030)
pH: 6 (ref 5.0–8.0)

## 2011-04-28 NOTE — ED Provider Notes (Signed)
History     CSN: [id number]  Arrival date & time 04/28/11  1554   First MD Initiated Contact with Patient 04/28/11 1605      Chief Complaint  Patient presents with  . Back Pain    (Consider location/radiation/quality/duration/timing/severity/associated sxs/prior treatment) Patient is a 26 y.o. male presenting with back pain. The history is provided by the patient.  Back Pain  This is a new problem. The current episode started more than 2 days ago (onset sat getting out of bed, across lower back, also with orange colored urine yest.). The problem has not changed since onset.The pain is associated with no known injury. The pain is present in the lumbar spine. The quality of the pain is described as shooting. The pain does not radiate. The pain is mild. Pertinent negatives include no numbness, no abdominal pain, no bladder incontinence, no dysuria, no leg pain, no paresis and no tingling.    Past Medical History  Diagnosis Date  . Hypertension   . Back pain     Past Surgical History  Procedure Date  . Knee surgery     Family History  Problem Relation Age of Onset  . Hypertension Mother   . Diabetes Father     History  Substance Use Topics  . Smoking status: Never Smoker   . Smokeless tobacco: Not on file  . Alcohol Use: No      Review of Systems  Constitutional: Negative.   Gastrointestinal: Negative for nausea, vomiting, abdominal pain and diarrhea.  Genitourinary: Positive for hematuria. Negative for bladder incontinence, dysuria, frequency and discharge.  Musculoskeletal: Positive for back pain.  Neurological: Negative for tingling and numbness.    Allergies  Onion; Other; and Peanut-containing drug products  Home Medications   Current Outpatient Rx  Name Route Sig Dispense Refill  . IBUPROFEN 200 MG PO TABS Oral Take 400 mg by mouth every 6 (six) hours as needed. Pain     . NAPROXEN SODIUM 550 MG PO TABS Oral Take 1 tablet (550 mg total) by mouth 2 (two)  times daily with a meal. 30 tablet 0    BP 135/80  Pulse 62  Temp(Src) 96.9 F (36.1 C) (Oral)  Resp 16  SpO2 100%  Physical Exam  Nursing note and vitals reviewed. Constitutional: He is oriented to person, place, and time. He appears well-developed and well-nourished.  Neck: Normal range of motion. Neck supple.  Abdominal: Soft. Bowel sounds are normal. There is no tenderness.  Musculoskeletal: He exhibits tenderness.       Back:  Neurological: He is alert and oriented to person, place, and time.  Skin: Skin is warm and dry.    ED Course  Procedures (including critical care time)  Labs Reviewed  POCT URINALYSIS DIP (DEVICE) - Abnormal; Notable for the following:    Hgb urine dipstick MODERATE (*)    All other components within normal limits  LAB REPORT - SCANNED   No results found.   1. Hematuria, undiagnosed cause       MDM  U/a pos for hgb--mod.      Linna Hoff, MD 04/29/11 2056

## 2011-04-28 NOTE — Discharge Instructions (Signed)
Drink plenty of fluids, call to see urologist for further eval.

## 2011-04-28 NOTE — ED Notes (Signed)
Chronic back pain.  Saturday morning had sudden onset of worsening pain in low back.  Patient also reports changes in urinary routine.  No abdominal pain. Reports that Monday his urine looked dark with a orange tone to urine color

## 2011-12-31 ENCOUNTER — Emergency Department (HOSPITAL_COMMUNITY): Payer: Self-pay

## 2011-12-31 ENCOUNTER — Encounter (HOSPITAL_COMMUNITY): Payer: Self-pay | Admitting: *Deleted

## 2011-12-31 ENCOUNTER — Emergency Department (HOSPITAL_COMMUNITY)
Admission: EM | Admit: 2011-12-31 | Discharge: 2011-12-31 | Disposition: A | Discharge status: Home / Self Care | Payer: Self-pay | Attending: Emergency Medicine | Admitting: Emergency Medicine

## 2011-12-31 DIAGNOSIS — R071 Chest pain on breathing: Secondary | ICD-10-CM | POA: Insufficient documentation

## 2011-12-31 DIAGNOSIS — R0789 Other chest pain: Secondary | ICD-10-CM

## 2011-12-31 DIAGNOSIS — I10 Essential (primary) hypertension: Secondary | ICD-10-CM | POA: Insufficient documentation

## 2011-12-31 LAB — CBC
HCT: 41.3 % (ref 39.0–52.0)
Hemoglobin: 13.6 g/dL (ref 13.0–17.0)
MCH: 27.5 pg (ref 26.0–34.0)
MCHC: 32.9 g/dL (ref 30.0–36.0)
MCV: 83.4 fL (ref 78.0–100.0)
Platelets: 235 10*3/uL (ref 150–400)
RBC: 4.95 MIL/uL (ref 4.22–5.81)
RDW: 13.8 % (ref 11.5–15.5)
WBC: 6.2 10*3/uL (ref 4.0–10.5)

## 2011-12-31 LAB — POCT I-STAT TROPONIN I
Troponin i, poc: 0.01 ng/mL (ref 0.00–0.08)
Troponin i, poc: 0 ng/mL (ref 0.00–0.08)

## 2011-12-31 LAB — BASIC METABOLIC PANEL WITH GFR
BUN: 11 mg/dL (ref 6–23)
CO2: 20 meq/L (ref 19–32)
Calcium: 9.3 mg/dL (ref 8.4–10.5)
Chloride: 102 meq/L (ref 96–112)
Creatinine, Ser: 1.18 mg/dL (ref 0.50–1.35)
GFR calc Af Amer: >90 mL/min
GFR calc non Af Amer: 83 mL/min — ABNORMAL LOW
Glucose, Bld: 85 mg/dL (ref 70–99)
Potassium: 4 meq/L (ref 3.5–5.1)
Sodium: 136 meq/L (ref 135–145)

## 2011-12-31 LAB — RAPID URINE DRUG SCREEN, HOSP PERFORMED
Amphetamines: NOT DETECTED
Barbiturates: NOT DETECTED
Benzodiazepines: NOT DETECTED
Cocaine: NOT DETECTED
Opiates: NOT DETECTED
Tetrahydrocannabinol: NOT DETECTED

## 2011-12-31 IMAGING — CR DG CHEST 2V
2 series · 2 of 2 positions shown · non-contrast
Comparison: [DATE]

CLINICAL DATA: Chest pain

CHEST - 2 VIEW

[w chest pa]
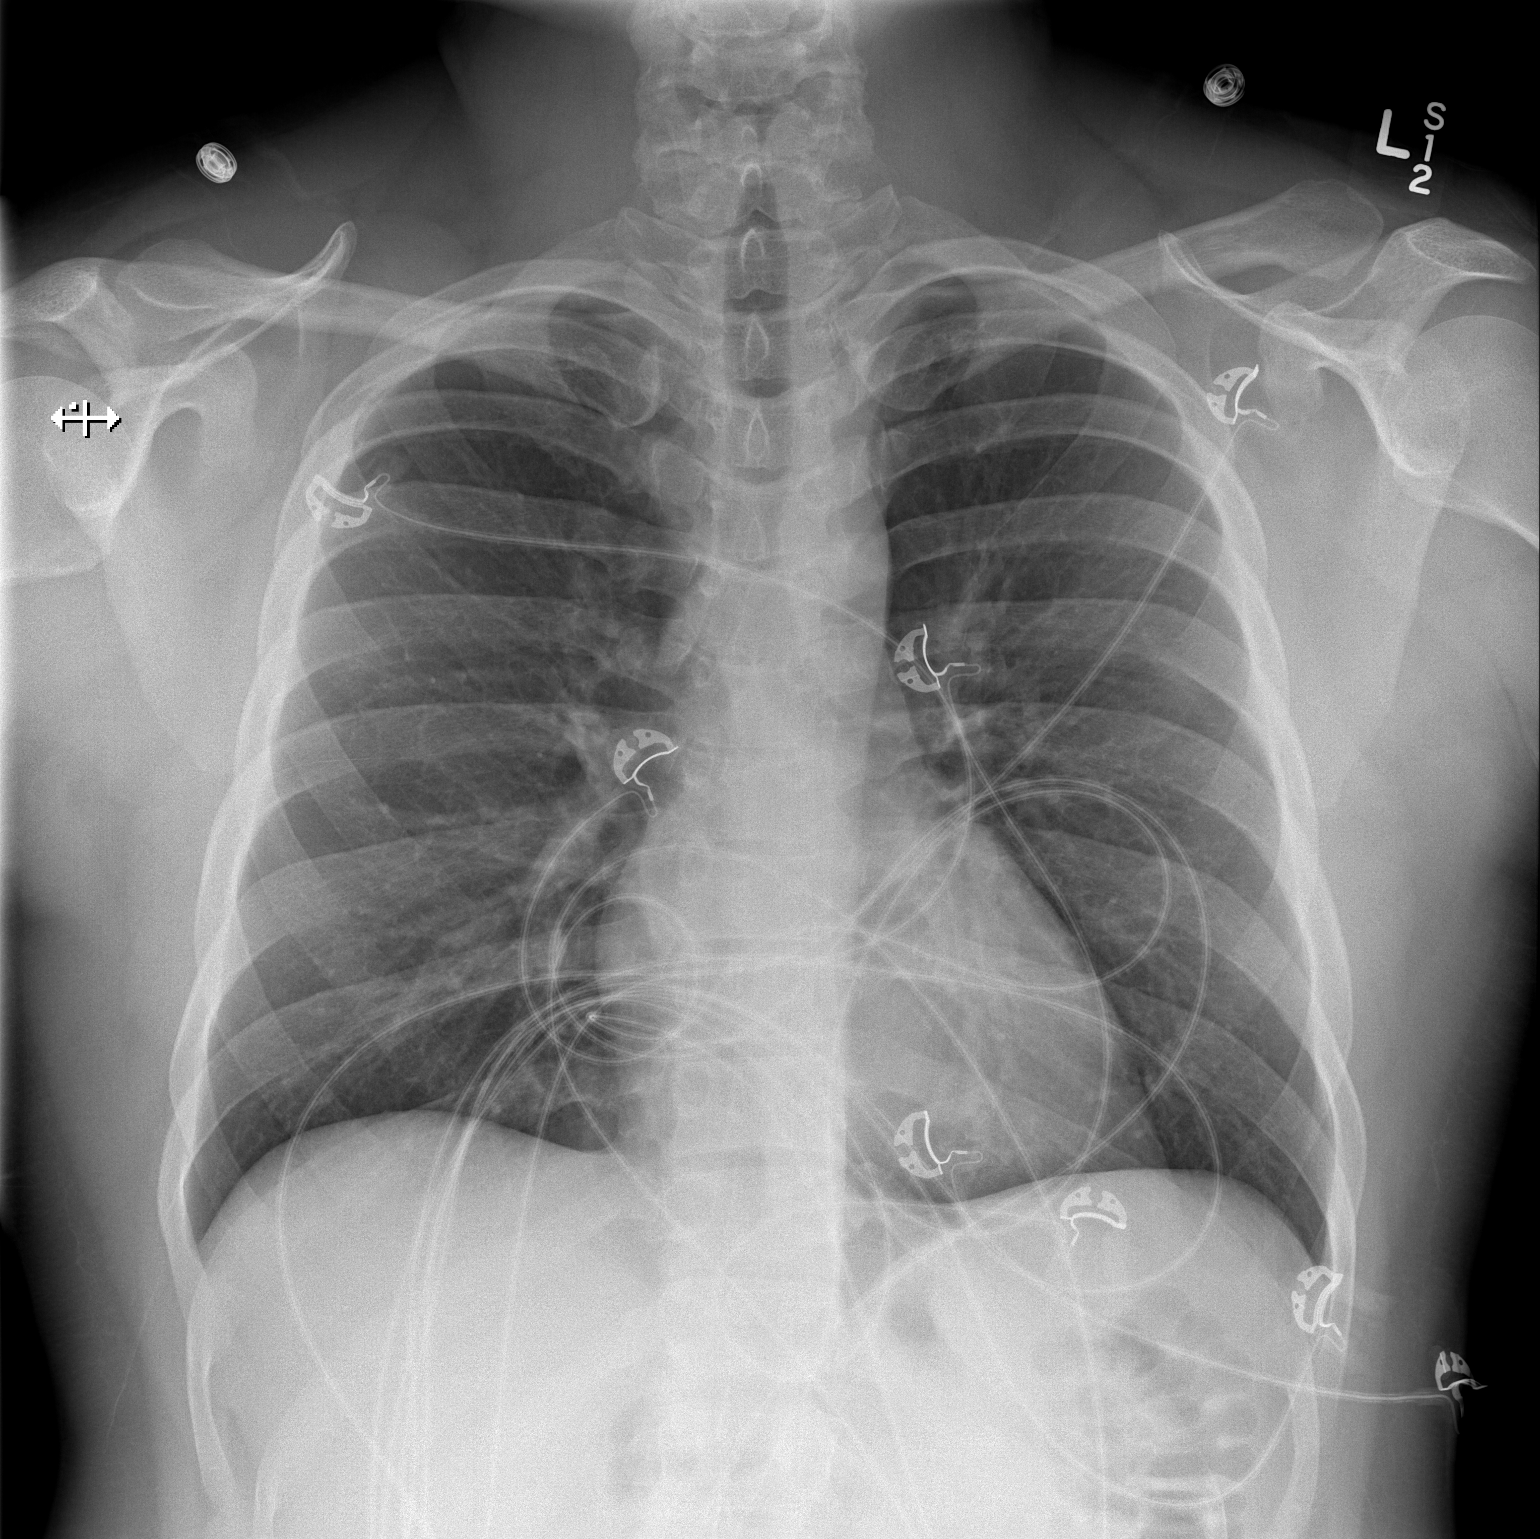

[w chest lat]
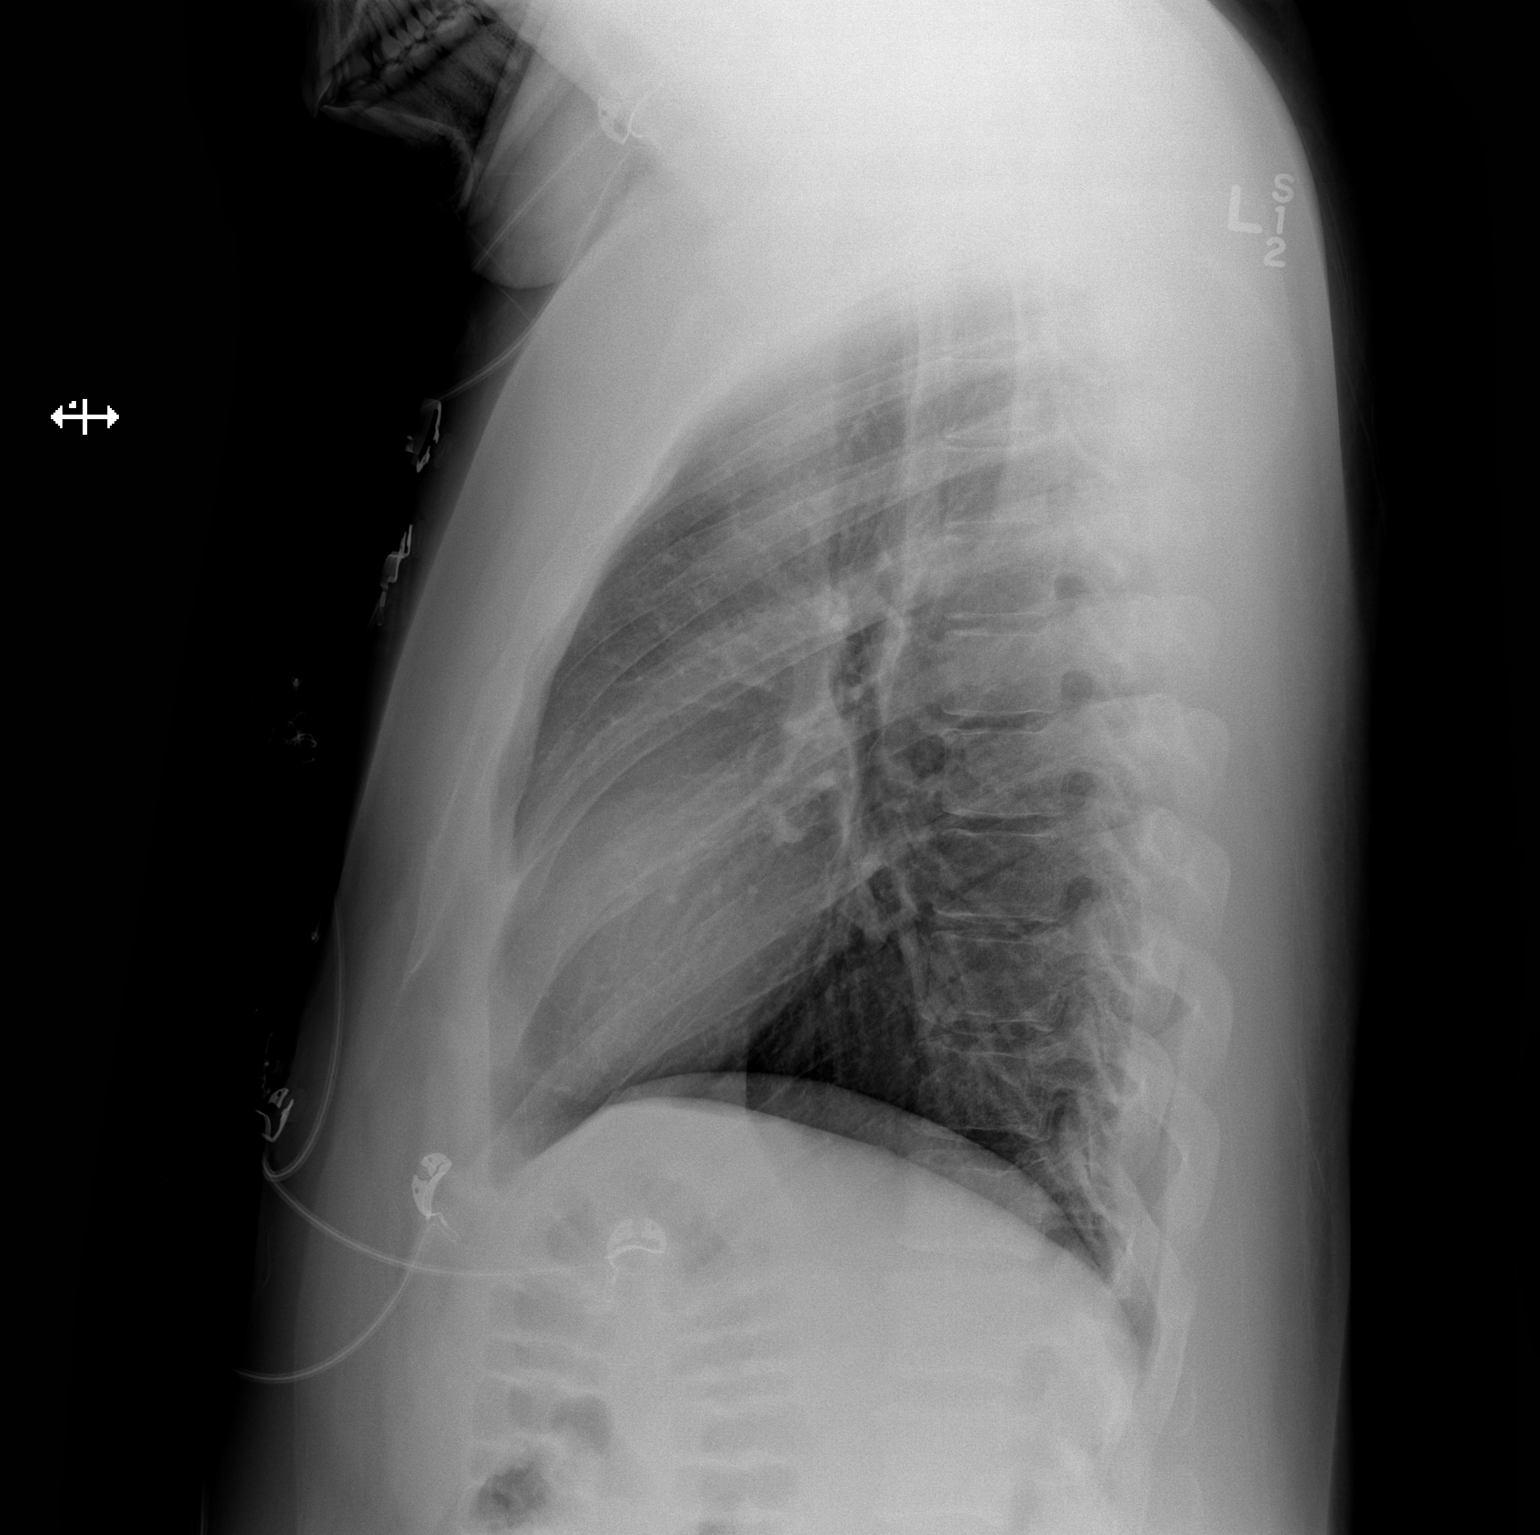

[2 of 2 positions shown; findings below may reference images not displayed]

FINDINGS: Lungs clear.  Heart size and pulmonary vascularity are
normal.  No adenopathy.  No pneumothorax.  No bone lesions.
IMPRESSION: No edema or consolidation.

## 2011-12-31 MED ORDER — KETOROLAC TROMETHAMINE 30 MG/ML IJ SOLN
30.0000 mg | Freq: Once | INTRAMUSCULAR | Status: AC
  Administered 2011-12-31: 30 mg via INTRAVENOUS
  Filled 2011-12-31: qty 1

## 2011-12-31 MED ORDER — IBUPROFEN 800 MG PO TABS: 800.0000 mg | ORAL_TABLET | Freq: Three times a day (TID) | ORAL | Status: DC | PRN

## 2011-12-31 NOTE — ED Notes (Signed)
Pt c/o pressure type left chest pain that started this am. States that pain is worse in certain positions. Pt denies n/v. Reports dizziness at times and left arm pain.

## 2012-01-04 NOTE — ED Provider Notes (Signed)
History     CSN: [id number]  Arrival date & time 12/31/11  1247   First MD Initiated Contact with Patient 12/31/11 1309      Chief Complaint  Patient presents with  . Chest Pain    (Consider location/radiation/quality/duration/timing/severity/associated sxs/prior treatment) HPI The patient presents to the ER with chest pain. The patient states that this started this morning after is daughter jumped on his chest. The patient states that he has had episodes of pain in the past. The patient denies shortness of breath, nausea, vomiting. Weakness, fever, cough, back pain, syncope, dizziness, hemoptysis, calf pain, abdominal pain, back pain, diarrhea, or visual changes. The patient denies taking anything prior to arrival for his discomfort. The pain has been constant. The patient states that certain positions and palpation make the pain worse. Past Medical History  Diagnosis Date  . Hypertension   . Back pain     Past Surgical History  Procedure Date  . Knee surgery     Family History  Problem Relation Age of Onset  . Hypertension Mother   . Diabetes Father     History  Substance Use Topics  . Smoking status: Never Smoker   . Smokeless tobacco: Not on file  . Alcohol Use: No      Review of Systems All other systems negative except as documented in the HPI. All pertinent positives and negatives as reviewed in the HPI.  Allergies  Onion; Other; and Peanut-containing drug products  Home Medications   Current Outpatient Rx  Name  Route  Sig  Dispense  Refill  . IBUPROFEN 200 MG PO TABS   Oral   Take 400 mg by mouth every 6 (six) hours as needed. Pain          . IBUPROFEN 800 MG PO TABS   Oral   Take 1 tablet (800 mg total) by mouth every 8 (eight) hours as needed for pain.   21 tablet   0     BP 120/73  Pulse 66  Temp 97.8 F (36.6 C) (Oral)  Resp 19  Ht 5\' 7"  (1.702 m)  Wt 207 lb (93.895 kg)  BMI 32.42 kg/m2  SpO2 100%  Physical Exam  Nursing note  and vitals reviewed. Constitutional: He is oriented to person, place, and time. He appears well-developed and well-nourished. No distress.  HENT:  Head: Normocephalic and atraumatic.  Mouth/Throat: Oropharynx is clear and moist.  Eyes: Pupils are equal, round, and reactive to light.  Neck: Normal range of motion. Neck supple.  Cardiovascular: Normal rate, regular rhythm and normal heart sounds.  Exam reveals no gallop and no friction rub.   No murmur heard. Pulmonary/Chest: Effort normal and breath sounds normal. No respiratory distress. He has no wheezes. He has no rales.  Neurological: He is alert and oriented to person, place, and time.  Skin: Skin is warm and dry. No rash noted. No erythema.    ED Course  Procedures (including critical care time)  Labs Reviewed  BASIC METABOLIC PANEL - Abnormal; Notable for the following:    GFR calc non Af Amer 83 (*)     All other components within normal limits  CBC  URINE RAPID DRUG SCREEN (HOSP PERFORMED)  POCT I-STAT TROPONIN I  POCT I-STAT TROPONIN I  LAB REPORT - SCANNED     1. Chest wall pain    The patient most likely has chest wall pain based on his HPI and PE findings. THe patient has no significant risk  factors noted. The patient is advised to return here as needed. I advised him that his testing was normal but does not exclude totally that this could be his heart.   MDM  MDM Reviewed: vitals and nursing note Interpretation: labs, ECG and x-ray    Date: 01/06/2012  Rate: 69  Rhythm: normal sinus rhythm  QRS Axis: normal  Intervals: normal  ST/T Wave abnormalities: ST elevations diffusely  Conduction Disutrbances:none  Narrative Interpretation:   Old EKG Reviewed: unchanged           Carlyle Dolly, PA-C 01/06/12 [phone]

## 2012-01-06 NOTE — ED Provider Notes (Signed)
Medical screening examination/treatment/procedure(s) were performed by non-physician practitioner and as supervising physician I was immediately available for consultation/collaboration.  Tobin Chad, MD 01/06/12 1504

## 2012-08-22 ENCOUNTER — Encounter (HOSPITAL_COMMUNITY): Payer: Self-pay | Admitting: Emergency Medicine

## 2012-08-22 DIAGNOSIS — K2970 Gastritis, unspecified, without bleeding: Secondary | ICD-10-CM | POA: Insufficient documentation

## 2012-08-22 DIAGNOSIS — I10 Essential (primary) hypertension: Secondary | ICD-10-CM | POA: Insufficient documentation

## 2012-08-22 DIAGNOSIS — R209 Unspecified disturbances of skin sensation: Secondary | ICD-10-CM | POA: Insufficient documentation

## 2012-08-22 DIAGNOSIS — R1013 Epigastric pain: Secondary | ICD-10-CM | POA: Insufficient documentation

## 2012-08-22 NOTE — ED Notes (Signed)
Pt also states, "I feel abd pressure when i take a deep breath, I feel like I have to cut my yawns and deep breaths short".

## 2012-08-22 NOTE — ED Notes (Signed)
PT. REPORTS MID CHEST PAIN AFTER VOMITTING YESTERDAY , ALSO REPORTS RIGHT LEG TINGLING ,AMBULATORY , NO SOB OR DIAPHORESIS.

## 2012-08-23 ENCOUNTER — Emergency Department (HOSPITAL_COMMUNITY)
Admission: EM | Admit: 2012-08-23 | Discharge: 2012-08-23 | Disposition: A | Discharge status: Home / Self Care | Payer: Self-pay | Attending: Emergency Medicine | Admitting: Emergency Medicine

## 2012-08-23 ENCOUNTER — Emergency Department (HOSPITAL_COMMUNITY)
Admit: 2012-08-23 | Discharge: 2012-08-23 | Disposition: A | Discharge status: Home / Self Care | Payer: Self-pay | Attending: Emergency Medicine | Admitting: Emergency Medicine

## 2012-08-23 DIAGNOSIS — K2970 Gastritis, unspecified, without bleeding: Secondary | ICD-10-CM

## 2012-08-23 LAB — COMPREHENSIVE METABOLIC PANEL
Albumin: 3.8 g/dL (ref 3.5–5.2)
BUN: 17 mg/dL (ref 6–23)
CO2: 25 mEq/L (ref 19–32)
Chloride: 102 mEq/L (ref 96–112)
Creatinine, Ser: 1.16 mg/dL (ref 0.50–1.35)
GFR calc Af Amer: >90 mL/min (ref >90)
GFR calc non Af Amer: 84 mL/min — ABNORMAL LOW (ref >90)
Glucose, Bld: 97 mg/dL (ref 70–99)
Total Bilirubin: 0.3 mg/dL (ref 0.3–1.2)

## 2012-08-23 LAB — CBC WITH DIFFERENTIAL/PLATELET
Basophils Relative: 1 % (ref 0–1)
HCT: 38.9 % — ABNORMAL LOW (ref 39.0–52.0)
Hemoglobin: 13.2 g/dL (ref 13.0–17.0)
Lymphs Abs: 2.4 10*3/uL (ref 0.7–4.0)
MCHC: 33.9 g/dL (ref 30.0–36.0)
Monocytes Absolute: 0.6 10*3/uL (ref 0.1–1.0)
Monocytes Relative: 7 % (ref 3–12)
Neutro Abs: 5.1 10*3/uL (ref 1.7–7.7)

## 2012-08-23 LAB — LIPASE, BLOOD: Lipase: 39 U/L (ref 11–59)

## 2012-08-23 IMAGING — CR DG CHEST 2V
2 series · 2 of 2 positions shown · non-contrast
Comparison: [DATE]

CLINICAL DATA: Chest pain and shortness of breath.

CHEST - 2 VIEW

[w chest pa]
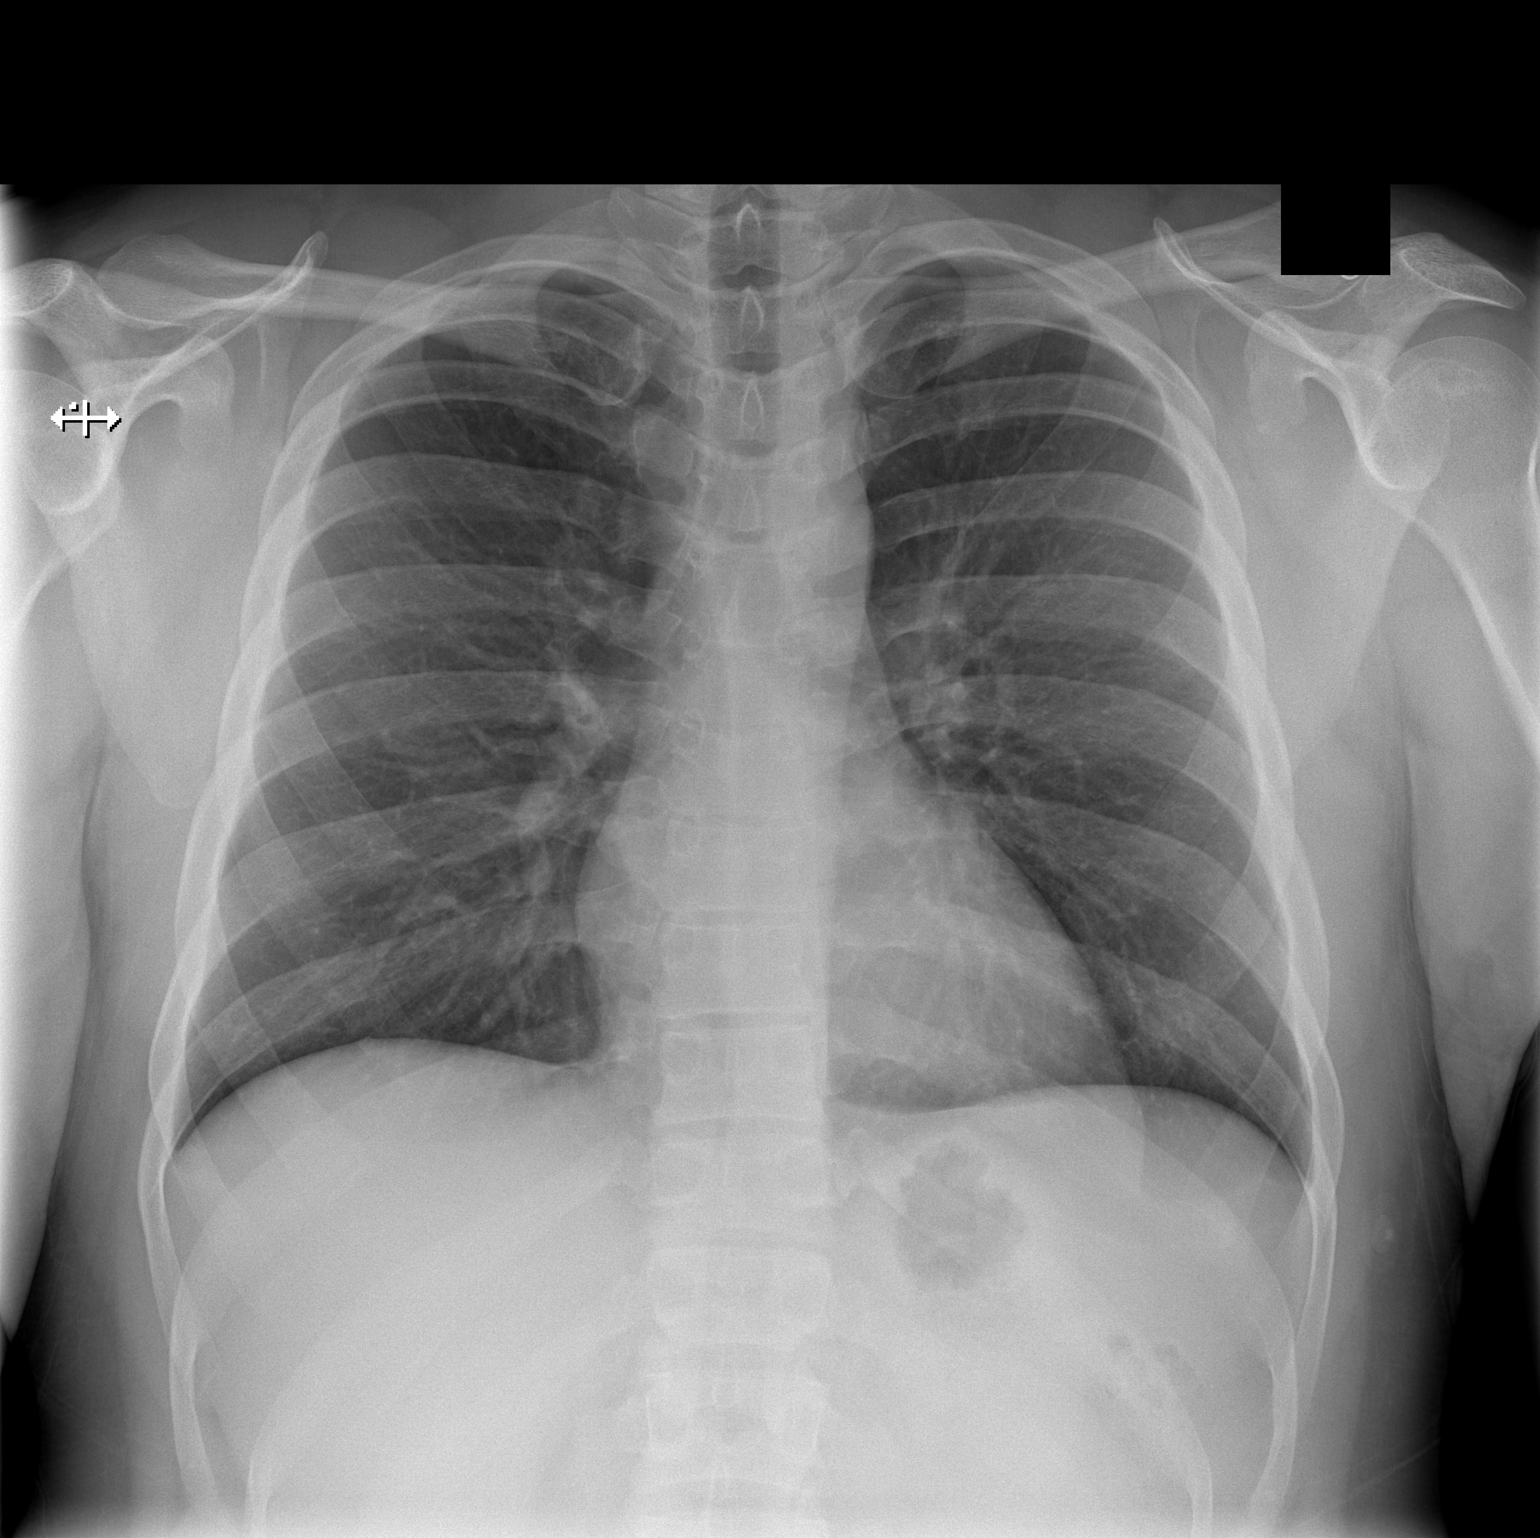

[w chest lat]
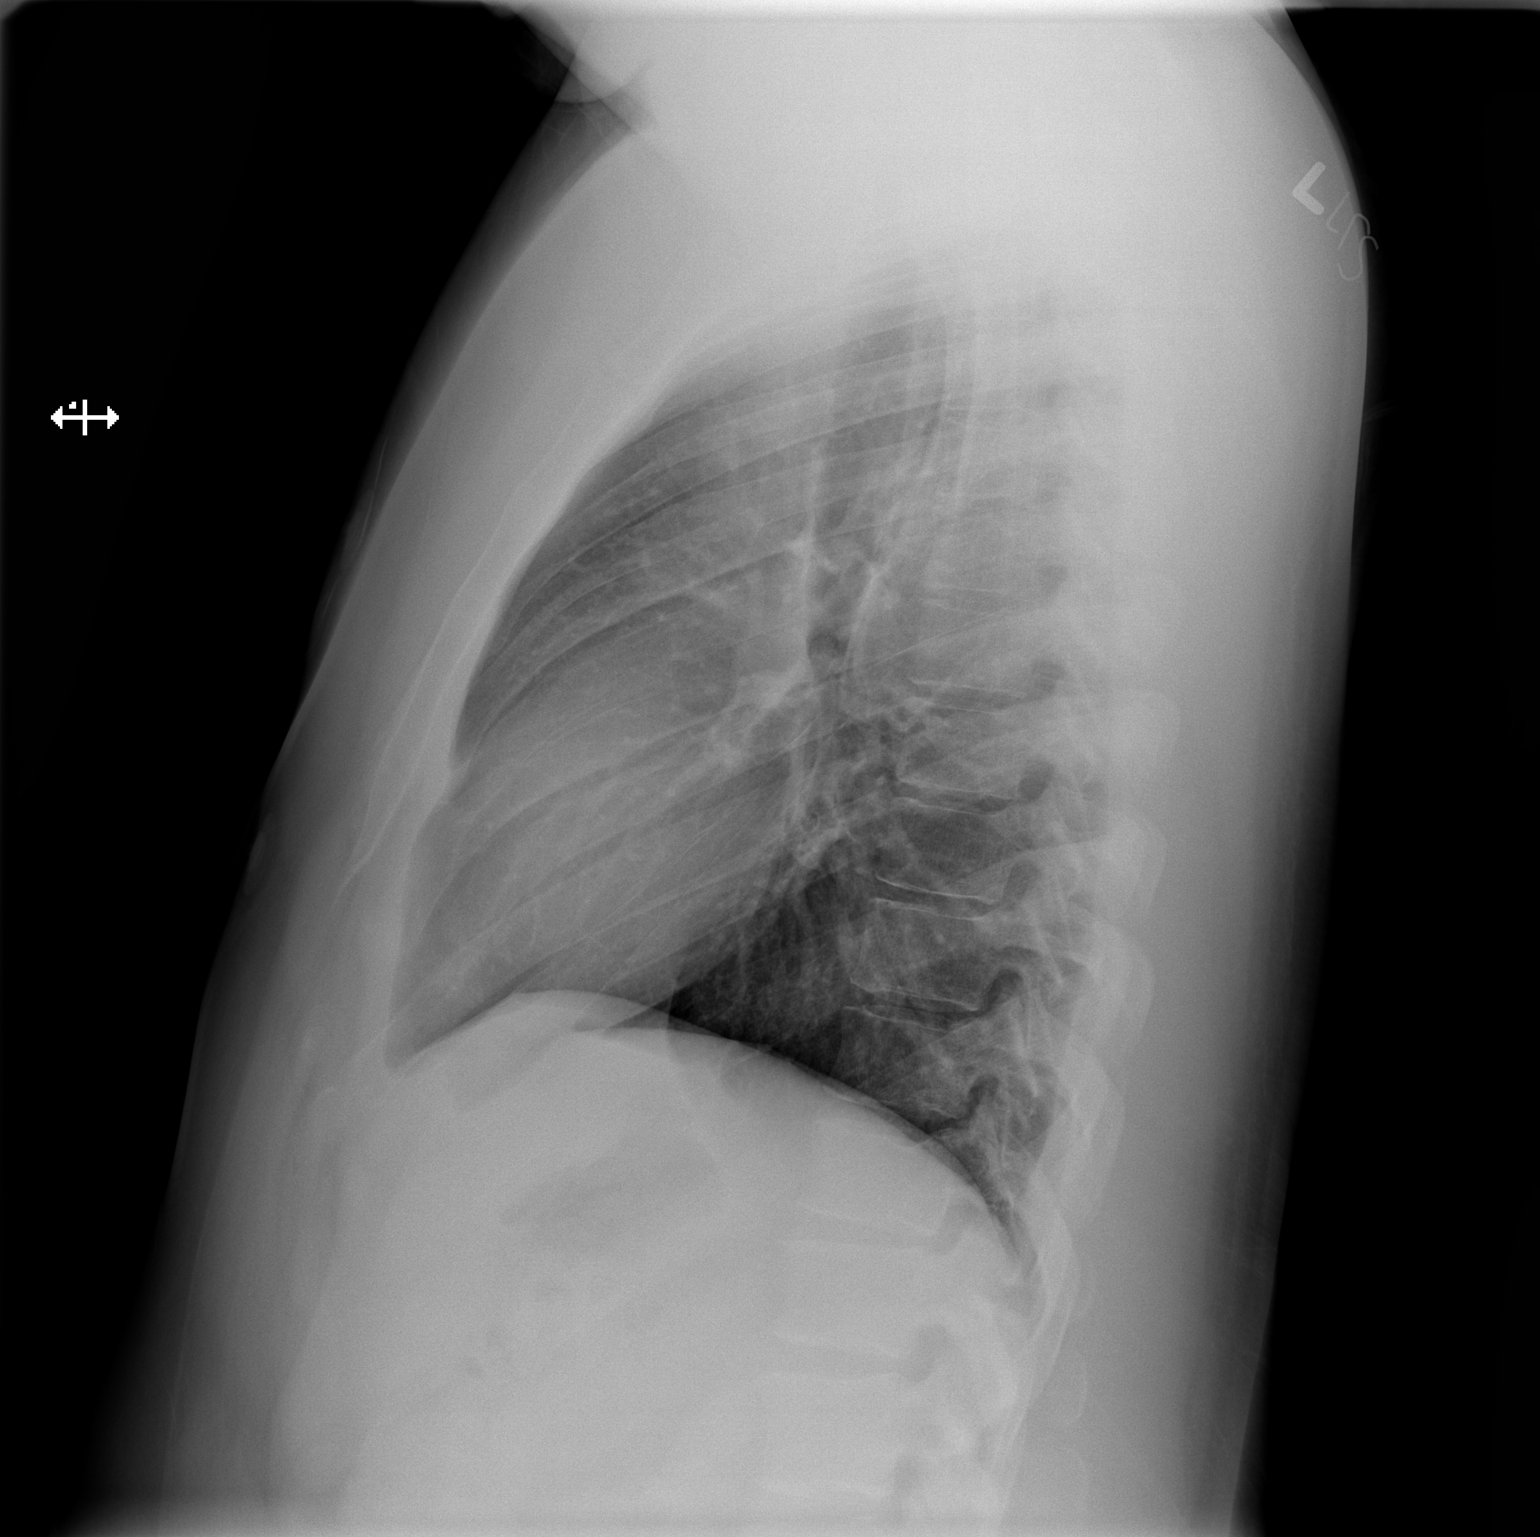

[2 of 2 positions shown; findings below may reference images not displayed]

FINDINGS: The heart size and mediastinal contours are within normal
limits.  Both lungs are clear.  The visualized skeletal structures
are unremarkable.
IMPRESSION: No active disease.

## 2012-08-23 MED ORDER — GI COCKTAIL ~~LOC~~
30.0000 mL | Freq: Once | ORAL | Status: AC
  Administered 2012-08-23: 30 mL via ORAL
  Filled 2012-08-23: qty 30

## 2012-08-23 NOTE — ED Provider Notes (Signed)
CSN: [id number]     Arrival date & time 08/22/12  2328 History     First MD Initiated Contact with Patient 08/23/12 0401     Chief Complaint  Patient presents with  . Chest Pain   (Consider location/radiation/quality/duration/timing/severity/associated sxs/prior Treatment) Patient is a 27 y.o. male presenting with chest pain. The history is provided by the patient.  Chest Pain He had an episode of vomiting and diarrhea 2 days ago of. There were slight streaks of blood in his emesis. He has had no nausea or vomiting and diarrhea since then. Following this, he noted waxing and waning numbness of his right leg. He did not notice anything that seemed to make his leg numb and anything that seemed to improve it. He states that when the numbness is there, he is uncomfortable with putting his full weight on it but he is able to put his full weight. He denies any weakness. Since the episode of vomiting, he has had a vague sense that he has unable to take in a full, deep breath. He feels like there is some pressure in his abdomen which keeps him from getting a full breath. He has not actually dyspneic. Nothing makes symptoms better nothing makes it worse. He denies any abdominal pain or back pain. He denies any chest pain.  Past Medical History  Diagnosis Date  . Hypertension   . Back pain    Past Surgical History  Procedure Laterality Date  . Knee surgery     Family History  Problem Relation Age of Onset  . Hypertension Mother   . Diabetes Father    History  Substance Use Topics  . Smoking status: Never Smoker   . Smokeless tobacco: Not on file  . Alcohol Use: No    Review of Systems  Cardiovascular: Positive for chest pain.  All other systems reviewed and are negative.    Allergies  Onion; Other; and Peanut-containing drug products  Home Medications  No current outpatient prescriptions on file. BP 128/67  Pulse 64  Temp(Src) 97.9 F (36.6 C) (Oral)  Resp 17  Ht 5\' 7"   (1.702 m)  Wt 221 lb (100.245 kg)  BMI 34.61 kg/m2  SpO2 100% Physical Exam  Nursing note and vitals reviewed.  27 year old male, resting comfortably and in no acute distress. Vital signs are normal. Oxygen saturation is 100%, which is normal. Head is normocephalic and atraumatic. PERRLA, EOMI. Oropharynx is clear. Neck is nontender and supple without adenopathy or JVD. Back is nontender and there is no CVA tenderness. Straight leg raise is normal. Lungs are clear without rales, wheezes, or rhonchi. Chest is nontender. Heart has regular rate and rhythm without murmur. Abdomen is soft, flat, with mild tenderness in the epigastric area. There is no rebound or guarding. There are no masses or hepatosplenomegaly and peristalsis is normoactive. Extremities have no cyanosis or edema, full range of motion is present. Skin is warm and dry without rash. Neurologic: Mental status is normal, cranial nerves are intact, there are no motor or sensory deficits. Careful motor and sensory exam of the legs is completely normal.  ED Course   Procedures (including critical care time)  Results for orders placed during the hospital encounter of 08/23/12  CBC WITH DIFFERENTIAL      Result Value Range   WBC 8.4  4.0 - 10.5 K/uL   RBC 4.66  4.22 - 5.81 MIL/uL   Hemoglobin 13.2  13.0 - 17.0 g/dL   HCT 40.9 (*)  39.0 - 52.0 %   MCV 83.5  78.0 - 100.0 fL   MCH 28.3  26.0 - 34.0 pg   MCHC 33.9  30.0 - 36.0 g/dL   RDW 16.1  09.6 - 04.5 %   Platelets 310  150 - 400 K/uL   Neutrophils Relative % 61  43 - 77 %   Neutro Abs 5.1  1.7 - 7.7 K/uL   Lymphocytes Relative 29  12 - 46 %   Lymphs Abs 2.4  0.7 - 4.0 K/uL   Monocytes Relative 7  3 - 12 %   Monocytes Absolute 0.6  0.1 - 1.0 K/uL   Eosinophils Relative 3  0 - 5 %   Eosinophils Absolute 0.2  0.0 - 0.7 K/uL   Basophils Relative 1  0 - 1 %   Basophils Absolute 0.1  0.0 - 0.1 K/uL  COMPREHENSIVE METABOLIC PANEL      Result Value Range   Sodium 138  135  - 145 mEq/L   Potassium 3.9  3.5 - 5.1 mEq/L   Chloride 102  96 - 112 mEq/L   CO2 25  19 - 32 mEq/L   Glucose, Bld 97  70 - 99 mg/dL   BUN 17  6 - 23 mg/dL   Creatinine, Ser 4.09  0.50 - 1.35 mg/dL   Calcium 9.4  8.4 - 81.1 mg/dL   Total Protein 7.7  6.0 - 8.3 g/dL   Albumin 3.8  3.5 - 5.2 g/dL   AST 24  0 - 37 U/L   ALT 17  0 - 53 U/L   Alkaline Phosphatase 60  39 - 117 U/L   Total Bilirubin 0.3  0.3 - 1.2 mg/dL   GFR calc non Af Amer 84 (*) >90 mL/min   GFR calc Af Amer >90  >90 mL/min  LIPASE, BLOOD      Result Value Range   Lipase 39  11 - 59 U/L   Dg Chest 2 View  08/23/2012   *RADIOLOGY REPORT*  Clinical Data:  Chest pain and shortness of breath.  CHEST - 2 VIEW  Comparison: 12/31/2011  Findings: The heart size and mediastinal contours are within normal limits.  Both lungs are clear.  The visualized skeletal structures are unremarkable.  IMPRESSION: No active disease.   Original Report Authenticated By: Irish Lack, M.D.     Date: 08/23/2012  Rate: 66  Rhythm: normal sinus rhythm  QRS Axis: normal  Intervals: normal  ST/T Wave abnormalities: early repolarization  Conduction Disutrbances:none  Narrative Interpretation:   Old EKG Reviewed: none available   1. Gastritis     MDM  Episode of vomiting and diarrhea which has resolved. Ongoing discomfort seems to be related to his epigastric tenderness and I suspect that he has a mild gastritis. I do not see any evidence of more serious pathology. Causes leg numbness is unclear but his neurologic exam is normal. He has not had any other symptoms to suggest a lumbar radiculopathy. Initial laboratory workup is unremarkable. He'll be given a therapeutic trial of a GI cocktail and reassessed.  He feels much better after GI cocktail. He is advised to use over-the-counter omeprazole or ranitidine. Return should symptoms worsen.  Dione Booze, MD 08/23/12 [phone]

## 2012-09-13 ENCOUNTER — Encounter (HOSPITAL_COMMUNITY): Payer: Self-pay | Admitting: Emergency Medicine

## 2012-09-13 ENCOUNTER — Emergency Department (HOSPITAL_COMMUNITY): Payer: Self-pay

## 2012-09-13 ENCOUNTER — Emergency Department (HOSPITAL_COMMUNITY)
Admission: EM | Admit: 2012-09-13 | Discharge: 2012-09-13 | Disposition: A | Discharge status: Home / Self Care | Payer: Self-pay | Attending: Emergency Medicine | Admitting: Emergency Medicine

## 2012-09-13 DIAGNOSIS — R51 Headache: Secondary | ICD-10-CM | POA: Insufficient documentation

## 2012-09-13 DIAGNOSIS — K089 Disorder of teeth and supporting structures, unspecified: Secondary | ICD-10-CM | POA: Insufficient documentation

## 2012-09-13 DIAGNOSIS — I10 Essential (primary) hypertension: Secondary | ICD-10-CM | POA: Insufficient documentation

## 2012-09-13 DIAGNOSIS — R519 Headache, unspecified: Secondary | ICD-10-CM

## 2012-09-13 DIAGNOSIS — R0789 Other chest pain: Secondary | ICD-10-CM | POA: Insufficient documentation

## 2012-09-13 LAB — URINALYSIS, ROUTINE W REFLEX MICROSCOPIC
Bilirubin Urine: NEGATIVE
Glucose, UA: NEGATIVE mg/dL
Ketones, ur: NEGATIVE mg/dL
Leukocytes, UA: NEGATIVE
Nitrite: NEGATIVE
Protein, ur: NEGATIVE mg/dL
Specific Gravity, Urine: 1.026 (ref 1.005–1.030)
Urobilinogen, UA: 1 mg/dL (ref 0.0–1.0)
pH: 5.5 (ref 5.0–8.0)

## 2012-09-13 LAB — COMPREHENSIVE METABOLIC PANEL
AST: 18 U/L (ref 0–37)
Albumin: 3.9 g/dL (ref 3.5–5.2)
Alkaline Phosphatase: 60 U/L (ref 39–117)
Chloride: 101 mEq/L (ref 96–112)
Potassium: 3.6 mEq/L (ref 3.5–5.1)
Total Bilirubin: 0.4 mg/dL (ref 0.3–1.2)
Total Protein: 7.8 g/dL (ref 6.0–8.3)

## 2012-09-13 LAB — CBC
Platelets: 309 10*3/uL (ref 150–400)
RDW: 14.2 % (ref 11.5–15.5)
WBC: 8.7 10*3/uL (ref 4.0–10.5)

## 2012-09-13 LAB — POCT I-STAT TROPONIN I: Troponin i, poc: 0 ng/mL (ref 0.00–0.08)

## 2012-09-13 LAB — URINE MICROSCOPIC-ADD ON

## 2012-09-13 IMAGING — CT CT HEAD W/O CM
2 series · 16 of 30 positions shown, 20 images · non-contrast
Comparison: None.

CLINICAL DATA: Recent headaches

CT HEAD WITHOUT CONTRAST
TECHNIQUE: Contiguous axial images were obtained from the base of
the skull through the vertex without contrast.

[Series 2: head w/o · axial · non-contrast · 0.48mm/px · z∈[+1409,+1529]mm · 13 of 30 slices shown, 17 images]
[im 3/30  brain]
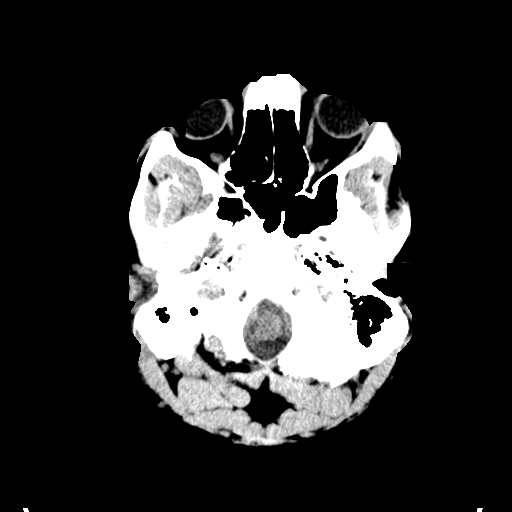
[im 3/30  bone]
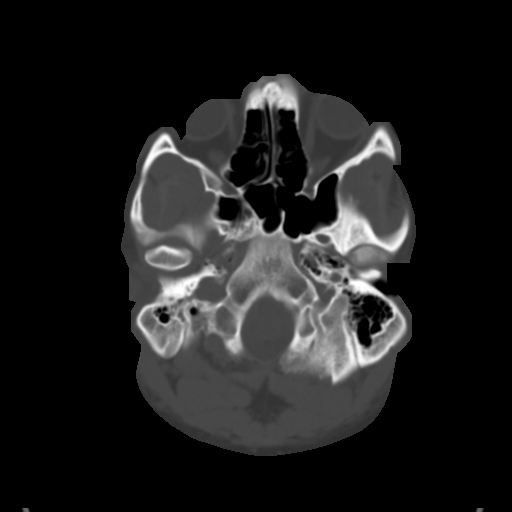
[im 5/30  brain]
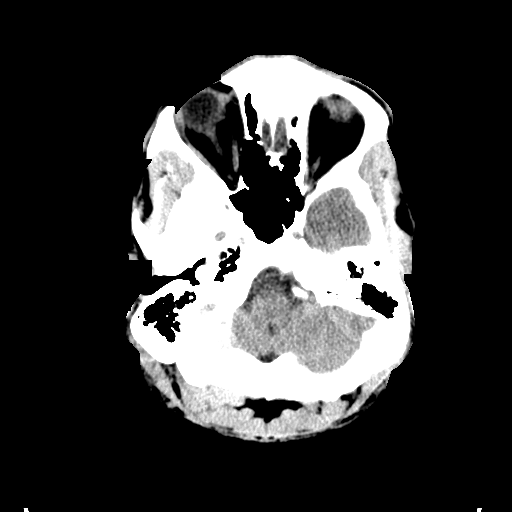
[im 7/30  brain]
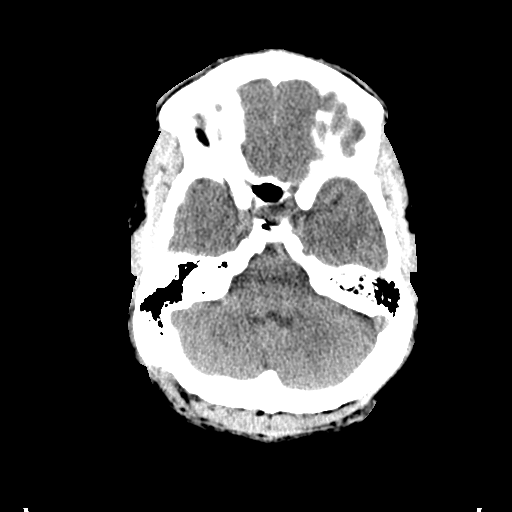
[im 9/30  brain]
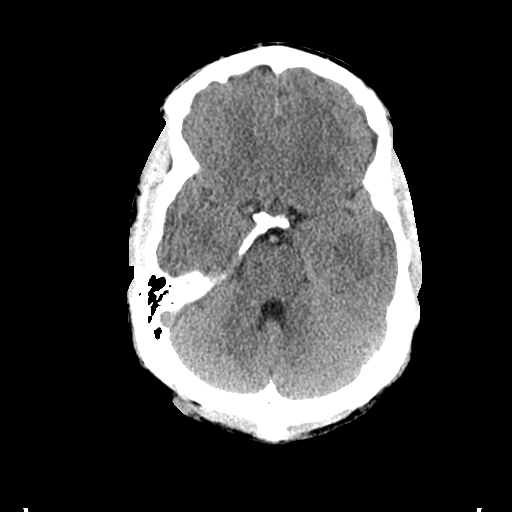
[im 11/30  brain]
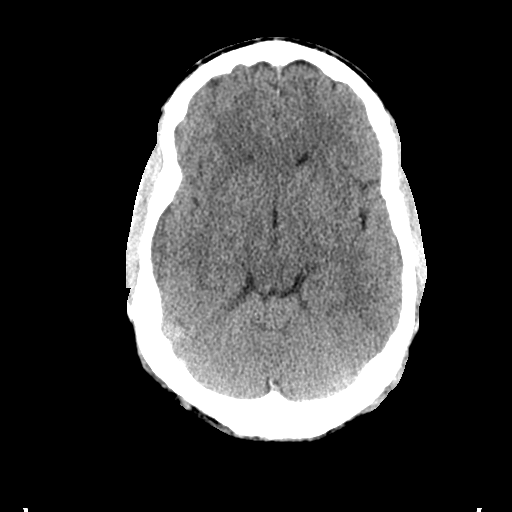
[im 11/30  bone]
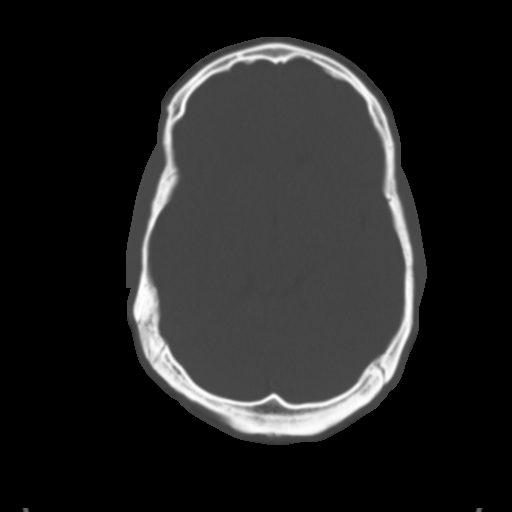
[im 13/30  brain]
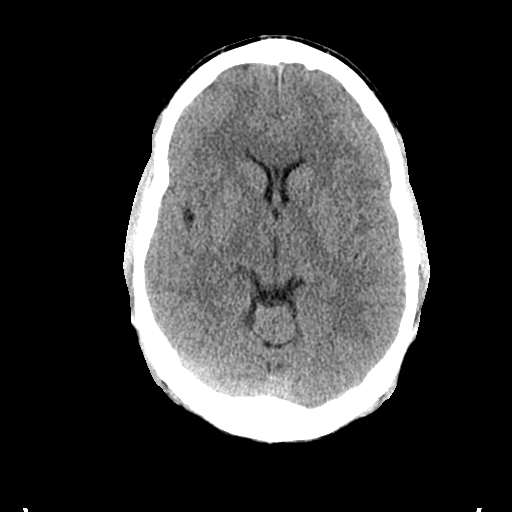
[im 15/30  brain]
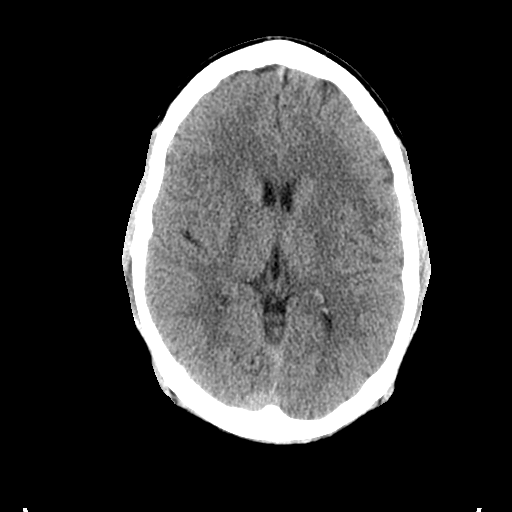
[im 17/30  brain]
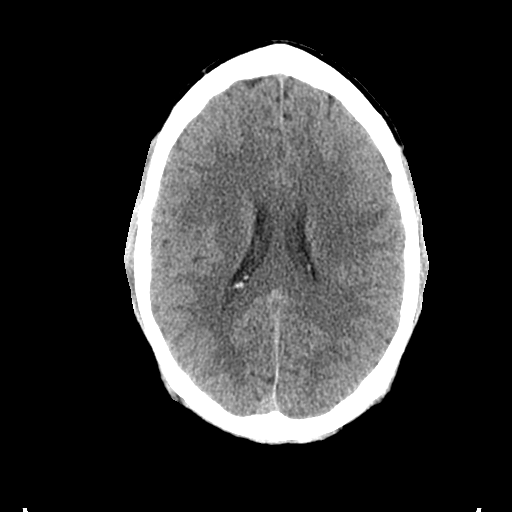
[im 19/30  brain]
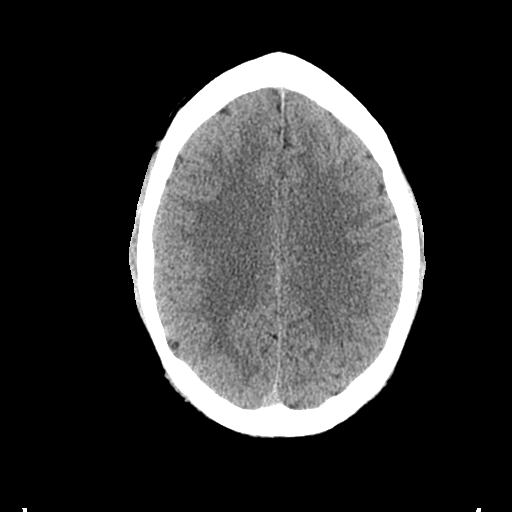
[im 19/30  bone]
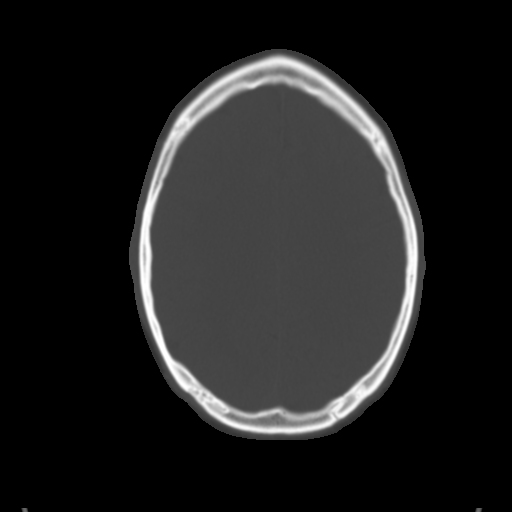
[im 21/30  brain]
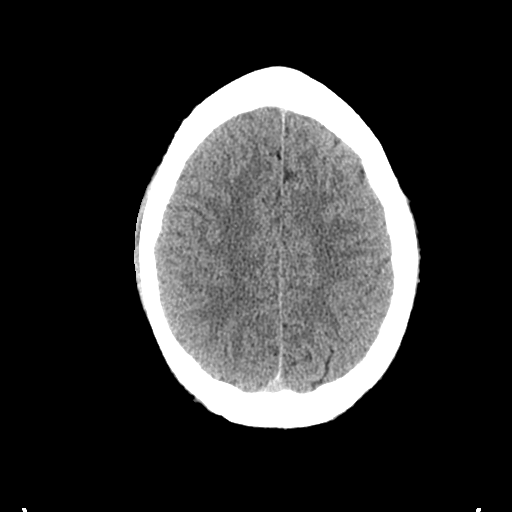
[im 23/30  brain]
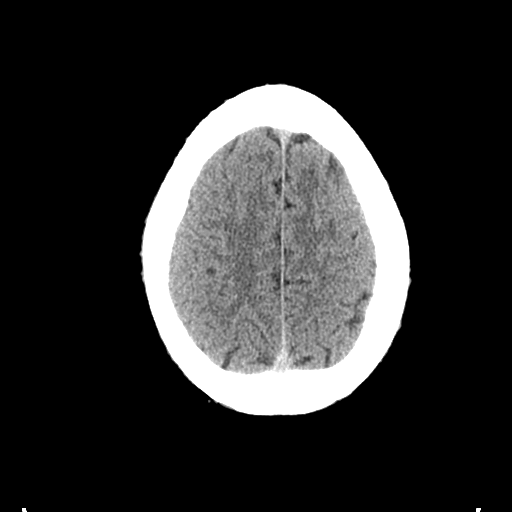
[im 25/30  brain]
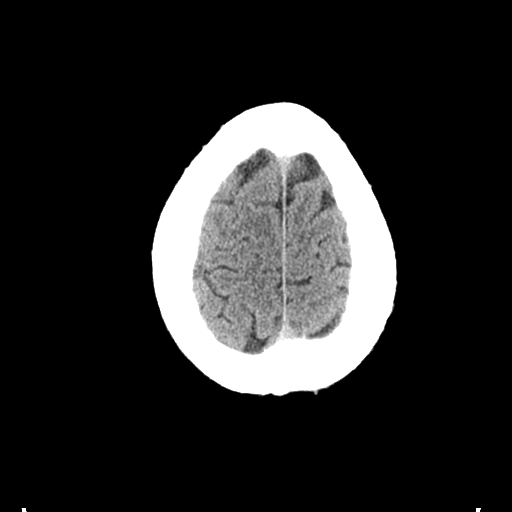
[im 27/30  brain]
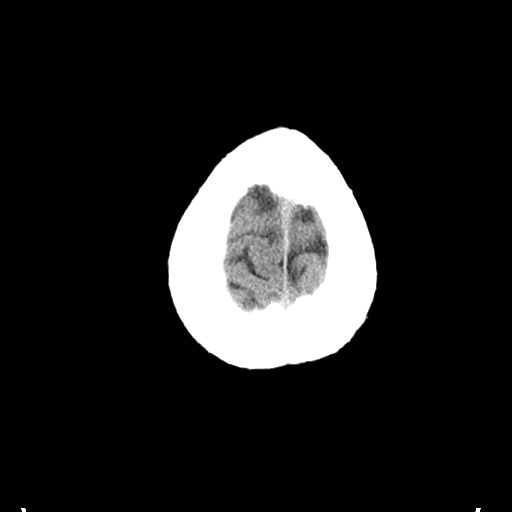
[im 27/30  bone]
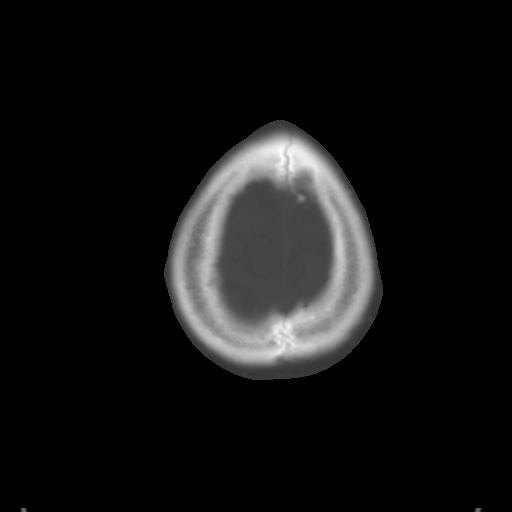

[Series 3: bone windows · axial · 0.48mm/px · z∈[+1409,+1449]mm · 3 of 30 slices shown]
[im 3/30  bone]
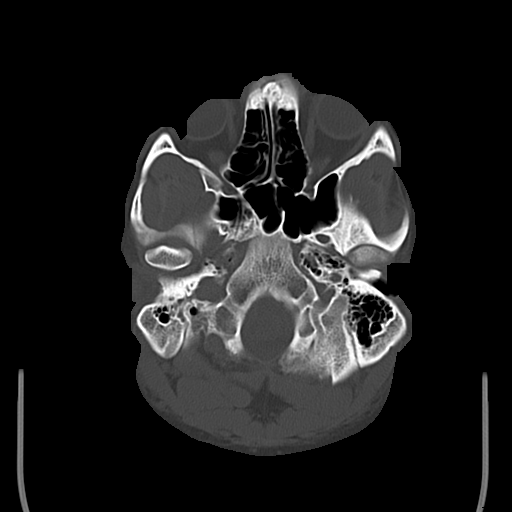
[im 7/30  bone]
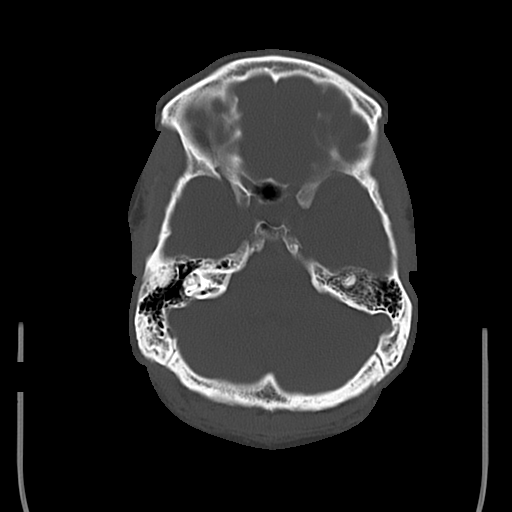
[im 11/30  bone]
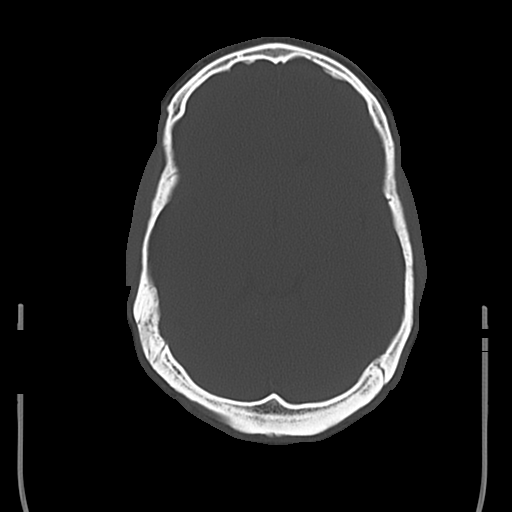

[16 of 30 positions shown; findings below may reference images not displayed]

FINDINGS: The bony calvarium is intact.  No gross soft tissue
abnormality is noted.  The ventricles are normal size
configuration.  No significant extra-axial fluid collection is
noted.  No findings to suggest acute hemorrhage, acute infarction
or space-occupying mass lesion are noted.
IMPRESSION: No acute abnormalities seen

## 2012-09-13 MED ORDER — IBUPROFEN 800 MG PO TABS: 800.0000 mg | ORAL_TABLET | Freq: Three times a day (TID) | ORAL | Status: DC | PRN

## 2012-09-13 MED ORDER — SODIUM CHLORIDE 0.9 % IV BOLUS (SEPSIS)
1000.0000 mL | Freq: Once | INTRAVENOUS | Status: AC
  Administered 2012-09-13: 1000 mL via INTRAVENOUS

## 2012-09-13 MED ORDER — ONDANSETRON HCL 4 MG/2ML IJ SOLN
4.0000 mg | Freq: Once | INTRAMUSCULAR | Status: DC
  Filled 2012-09-13: qty 2

## 2012-09-13 MED ORDER — KETOROLAC TROMETHAMINE 30 MG/ML IJ SOLN
30.0000 mg | Freq: Once | INTRAMUSCULAR | Status: AC
  Administered 2012-09-13: 30 mg via INTRAVENOUS
  Filled 2012-09-13: qty 1

## 2012-09-13 MED ORDER — PROCHLORPERAZINE EDISYLATE 5 MG/ML IJ SOLN
10.0000 mg | Freq: Once | INTRAMUSCULAR | Status: AC
  Administered 2012-09-13: 10 mg via INTRAVENOUS
  Filled 2012-09-13: qty 2

## 2012-09-13 MED ORDER — KETOROLAC TROMETHAMINE 60 MG/2ML IM SOLN: 60.0000 mg | Freq: Once | INTRAMUSCULAR | Status: DC

## 2012-09-13 NOTE — ED Provider Notes (Signed)
CSN: [id number]     Arrival date & time 09/13/12  1706 History     First MD Initiated Contact with Patient 09/13/12 1808     Chief Complaint  Patient presents with  . Chest Pain  . Headache  . Dental Pain   (Consider location/radiation/quality/duration/timing/severity/associated sxs/prior Treatment) HPI Patient presents to the emergency department with a two-week history of chest pain, and shortness of breath, with, headache and photophobia.  Patient, states, that he was recently seen in the emergency department for similar symptoms.  Patient, states, that over the last week.  His symptoms have progressed and gotten worse.  Patient, states he did not take anything prior to arrival for her symptoms.  Patient, states, that the light seems to make his headaches, worse patient denies nausea, vomiting, fever, cough, diarrhea blurred vision, weakness, numbness, dizziness, syncope, back pain, or neck pain.  Patient, states, that the testing, previously did not show any abnormalities Past Medical History  Diagnosis Date  . Hypertension   . Back pain    Past Surgical History  Procedure Laterality Date  . Knee surgery     Family History  Problem Relation Age of Onset  . Hypertension Mother   . Diabetes Father    History  Substance Use Topics  . Smoking status: Never Smoker   . Smokeless tobacco: Not on file  . Alcohol Use: No    Review of Systems All other systems negative except as documented in the HPI. All pertinent positives and negatives as reviewed in the HPI. Allergies  Onion; Other; and Peanut-containing drug products  Home Medications  No current outpatient prescriptions on file. BP 148/77  Pulse 90  Temp(Src) 98.6 F (37 C) (Oral)  SpO2 100% Physical Exam  Nursing note and vitals reviewed. Constitutional: He is oriented to person, place, and time. He appears well-developed and well-nourished. No distress.  HENT:  Head: Normocephalic and atraumatic.  Mouth/Throat:  Oropharynx is clear and moist.  Eyes: Pupils are equal, round, and reactive to light.  Neck: Normal range of motion. Neck supple. No thyromegaly present.  Cardiovascular: Normal rate, regular rhythm and normal heart sounds.  Exam reveals no gallop and no friction rub.   No murmur heard. Pulmonary/Chest: Effort normal and breath sounds normal. No respiratory distress.  Abdominal: Soft. Bowel sounds are normal. He exhibits no distension. There is no tenderness. There is no rebound.  Musculoskeletal: Normal range of motion.  Lymphadenopathy:    He has no cervical adenopathy.  Neurological: He is alert and oriented to person, place, and time. He exhibits normal muscle tone. Coordination normal.  Skin: Skin is warm and dry. No rash noted. No erythema.    ED Course   Procedures (including critical care time)  Labs Reviewed  COMPREHENSIVE METABOLIC PANEL - Abnormal; Notable for the following:    Glucose, Bld 100 (*)    GFR calc non Af Amer 75 (*)    GFR calc Af Amer 87 (*)    All other components within normal limits  URINALYSIS, ROUTINE W REFLEX MICROSCOPIC - Abnormal; Notable for the following:    Hgb urine dipstick MODERATE (*)    All other components within normal limits  CBC  URINE MICROSCOPIC-ADD ON  POCT I-STAT TROPONIN I  POCT I-STAT TROPONIN I   Ct Head Wo Contrast  09/13/2012   *RADIOLOGY REPORT*  Clinical Data: Recent headaches  CT HEAD WITHOUT CONTRAST  Technique:  Contiguous axial images were obtained from the base of the skull through the  vertex without contrast.  Comparison: None.  Findings: The bony calvarium is intact.  No gross soft tissue abnormality is noted.  The ventricles are normal size configuration.  No significant extra-axial fluid collection is noted.  No findings to suggest acute hemorrhage, acute infarction or space-occupying mass lesion are noted.  IMPRESSION: No acute abnormalities seen   Original Report Authenticated By: Alcide Clever, M.D.   Patient is  feeling improved after IV fluids and headache mdication which consisted of Toradol, Compazine, Benadryl.  The patient has atypical chest pain, symptoms that would be consistent with cardiac chest pain.  Patient is PERC negative.  Patient is advised to return here as needed.  Also asked him to increase his fluid intake. MDM  MDM Reviewed: vitals and nursing note Interpretation: labs, ECG and x-ray   Date: 09/13/2012  Rate: 90  Rhythm: normal sinus rhythm  QRS Axis: normal  Intervals: normal  ST/T Wave abnormalities: normal  Conduction Disutrbances:none  Narrative Interpretation:   Old EKG Reviewed: unchanged      Carlyle Dolly, PA-C 09/13/12 2129

## 2012-09-13 NOTE — ED Notes (Signed)
Pt states he has had dental pain, headache and chest pain x 1 week.

## 2012-09-14 NOTE — ED Provider Notes (Signed)
Medical screening examination/treatment/procedure(s) were performed by non-physician practitioner and as supervising physician I was immediately available for consultation/collaboration.  Larie Mathes N Santiago Stenzel, DO 09/14/12 0007 

## 2012-09-26 ENCOUNTER — Encounter (HOSPITAL_COMMUNITY): Payer: Self-pay | Admitting: Emergency Medicine

## 2012-09-26 ENCOUNTER — Emergency Department (INDEPENDENT_AMBULATORY_CARE_PROVIDER_SITE_OTHER)
Admission: EM | Admit: 2012-09-26 | Discharge: 2012-09-26 | Disposition: A | Discharge status: Home / Self Care | Payer: Self-pay | Source: Home / Self Care | Attending: Emergency Medicine | Admitting: Emergency Medicine

## 2012-09-26 DIAGNOSIS — H109 Unspecified conjunctivitis: Secondary | ICD-10-CM

## 2012-09-26 MED ORDER — TETRACAINE HCL 0.5 % OP SOLN
OPHTHALMIC | Status: AC
  Filled 2012-09-26: qty 2

## 2012-09-26 MED ORDER — TOBRAMYCIN 0.3 % OP SOLN: 1.0000 [drp] | OPHTHALMIC | Status: DC

## 2012-09-26 NOTE — ED Provider Notes (Signed)
Chief Complaint:   Chief Complaint  Patient presents with  . Conjunctivitis    bilateral eye redness. irritation. and drainage.     History of Present Illness:   William Ali is a 27 year old male who has had a two-day history of bilateral redness of both of his eyes. His eyelids are a little bit puffy. The eyes have been painful, burning, itching, and irritated feeling. He's had some whitish discharge, crusting of the lids, and sometimes the lids get stuck together. He's had some light sensitivity. No changes in his vision. He notes his left eye is always worse in his right. He's also had some upper respiratory symptoms with dry cough, headache, nasal congestion, and nausea. He denies any fever, chills, sore throat, adenopathy, vomiting, or diarrhea. He's had no known exposures.  Review of Systems:  Other than noted above, the patient denies any of the following symptoms: Systemic:  No fever, chills, sweats, fatigue, or weight loss. Eye:  No redness, eye pain, photophobia, discharge, blurred vision, or diplopia. ENT:  No nasal congestion, rhinorrhea, or sore throat. Lymphatic:  No adenopathy. Skin:  No rash or pruritis.  PMFSH:  Past medical history, family history, social history, meds, and allergies were reviewed.   Physical Exam:   Vital signs:  BP 132/81  Pulse 77  Temp(Src) 98.1 F (36.7 C) (Oral)  Resp 18  SpO2 100% General:  Alert and in no distress. Eye:  His lids were normal. Conjunctiva is were mildly injected. There was no discharge. Corneas were intact to fluorescein staining, anterior chamber was normal, fundi are benign, PERRLA, full EOMs. ENT:  TMs and canals clear.  Nasal mucosa normal.  No intra-oral lesions, mucous membranes moist, pharynx clear. Neck:  No adenopathy tenderness or mass. Skin:  Clear, warm and dry.  Assessment:  The encounter diagnosis was Conjunctivitis.  Viral versus bacterial.  Plan:   1.  The following meds were prescribed:   Discharge  Medication List as of 09/26/2012  7:14 PM    START taking these medications   Details  tobramycin (TOBREX) 0.3 % ophthalmic solution Place 1 drop into both eyes every 4 (four) hours., Starting 09/26/2012, Until Discontinued, Normal       2.  The patient was instructed in symptomatic care and handouts were given. 3.  The patient was told to return if becoming worse in any way, if no better in 3 or 4 days, and given some red flag symptoms such as worsening pain or changes in his vision that would indicate earlier return. 4.  Follow up here if necessary.     Reuben Likes, MD 09/26/12 2125

## 2012-09-26 NOTE — ED Notes (Signed)
C/o bilateral eye redness and irritation. Drainage. Eyes crusted in the a.m. Denies fever v/d. Some mild nausea.  Onset yesterday.  Pt has not tried any otc meds for symptoms.

## 2014-05-01 ENCOUNTER — Emergency Department (HOSPITAL_COMMUNITY)
Admission: EM | Admit: 2014-05-01 | Discharge: 2014-05-01 | Discharge status: Against Medical Advice | Payer: Self-pay | Source: Home / Self Care

## 2014-05-02 ENCOUNTER — Emergency Department (HOSPITAL_COMMUNITY)
Admission: EM | Admit: 2014-05-02 | Discharge: 2014-05-02 | Disposition: A | Discharge status: Home / Self Care | Payer: Self-pay | Attending: Emergency Medicine | Admitting: Emergency Medicine

## 2014-05-02 ENCOUNTER — Emergency Department (HOSPITAL_COMMUNITY): Payer: Self-pay

## 2014-05-02 ENCOUNTER — Encounter (HOSPITAL_COMMUNITY): Payer: Self-pay | Admitting: Emergency Medicine

## 2014-05-02 DIAGNOSIS — I10 Essential (primary) hypertension: Secondary | ICD-10-CM | POA: Insufficient documentation

## 2014-05-02 DIAGNOSIS — R319 Hematuria, unspecified: Secondary | ICD-10-CM | POA: Insufficient documentation

## 2014-05-02 DIAGNOSIS — M5489 Other dorsalgia: Secondary | ICD-10-CM

## 2014-05-02 DIAGNOSIS — M545 Low back pain: Secondary | ICD-10-CM | POA: Insufficient documentation

## 2014-05-02 LAB — URINALYSIS, ROUTINE W REFLEX MICROSCOPIC
Bilirubin Urine: NEGATIVE
Glucose, UA: NEGATIVE mg/dL
Ketones, ur: NEGATIVE mg/dL
Leukocytes, UA: NEGATIVE
NITRITE: NEGATIVE
Protein, ur: NEGATIVE mg/dL
Specific Gravity, Urine: 1.023 (ref 1.005–1.030)
UROBILINOGEN UA: 1 mg/dL (ref 0.0–1.0)
pH: 5.5 (ref 5.0–8.0)

## 2014-05-02 LAB — BASIC METABOLIC PANEL
Anion gap: 8 (ref 5–15)
BUN: 18 mg/dL (ref 6–23)
CO2: 25 mmol/L (ref 19–32)
CREATININE: 1.15 mg/dL (ref 0.50–1.35)
Calcium: 9.1 mg/dL (ref 8.4–10.5)
Chloride: 107 mmol/L (ref 96–112)
GFR, EST NON AFRICAN AMERICAN: 85 mL/min — AB (ref >90)
Glucose, Bld: 97 mg/dL (ref 70–99)
Potassium: 4.2 mmol/L (ref 3.5–5.1)
Sodium: 140 mmol/L (ref 135–145)

## 2014-05-02 LAB — URINE MICROSCOPIC-ADD ON

## 2014-05-02 IMAGING — CT CT ABD-PELV W/O CM
1 of 2 series · 15 of 32 positions shown, 19 images · non-contrast
Comparison: None.

CLINICAL DATA: Back pain, right-sided back pain.  No known injury.

EXAM:
CT ABDOMEN AND PELVIS WITHOUT CONTRAST
TECHNIQUE: Multidetector CT imaging of the abdomen and pelvis was performed
following the standard protocol without IV contrast.

[Series 2: abd/pel w/o · axial · non-contrast · 0.74mm/px · z∈[+1135,+1540]mm · 15 of 89 slices shown, 19 images]
[im 4/89  soft-tissue]
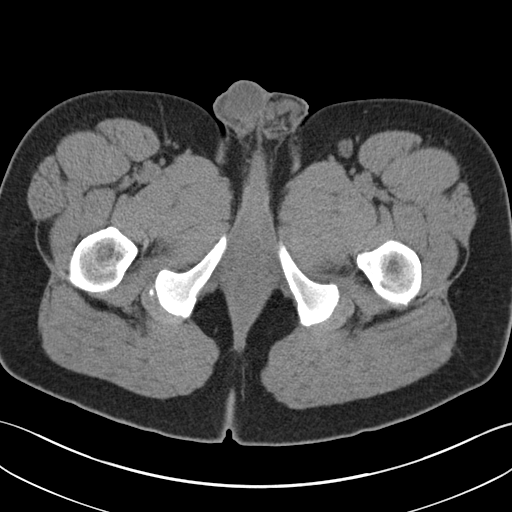
[im 4/89  bone]
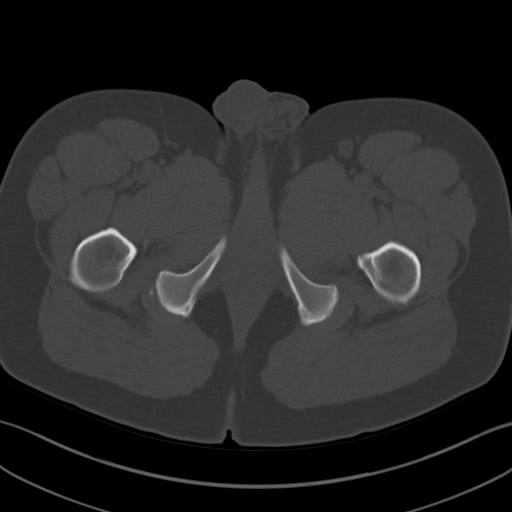
[im 11/89  soft-tissue]
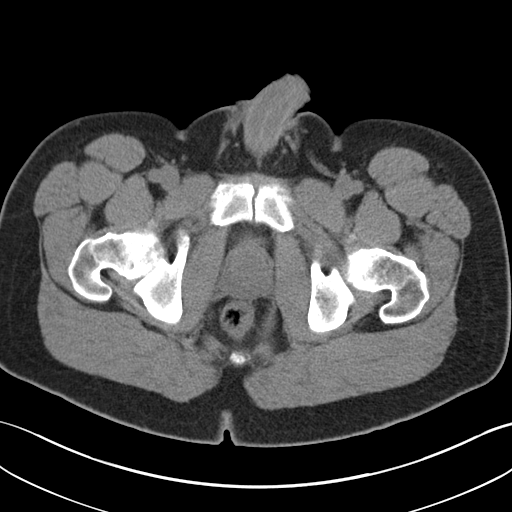
[im 18/89  soft-tissue]
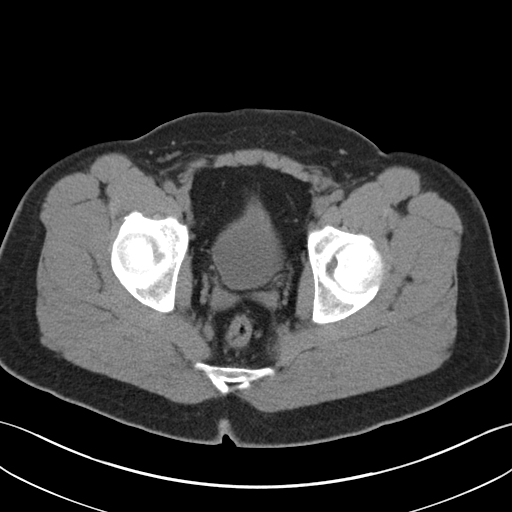
[im 25/89  soft-tissue]
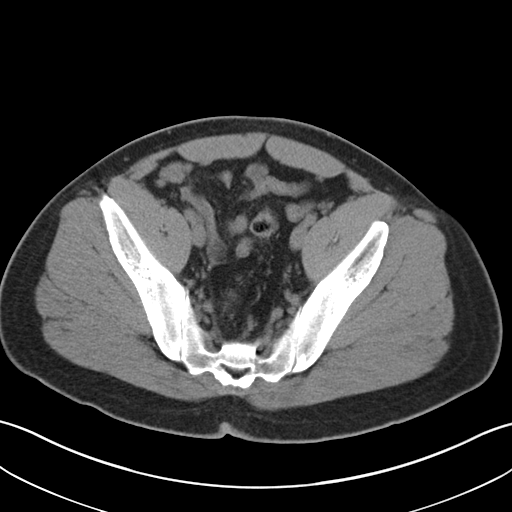
[im 32/89  soft-tissue]
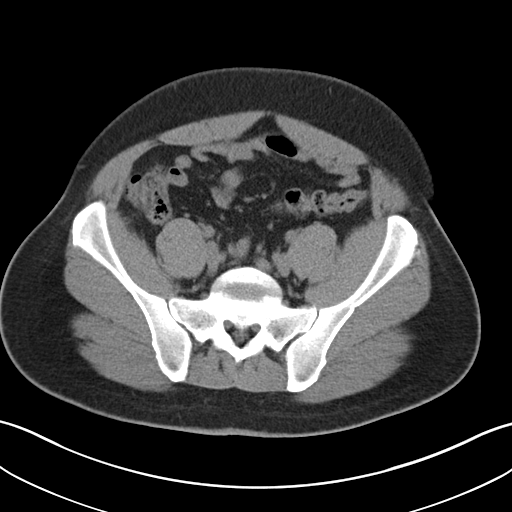
[im 39/89  soft-tissue]
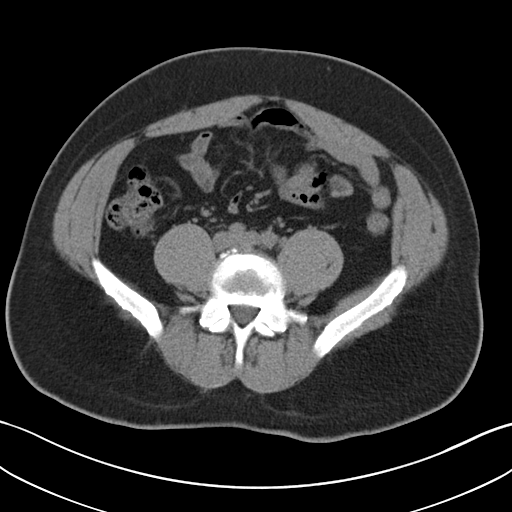
[im 46/89  soft-tissue]
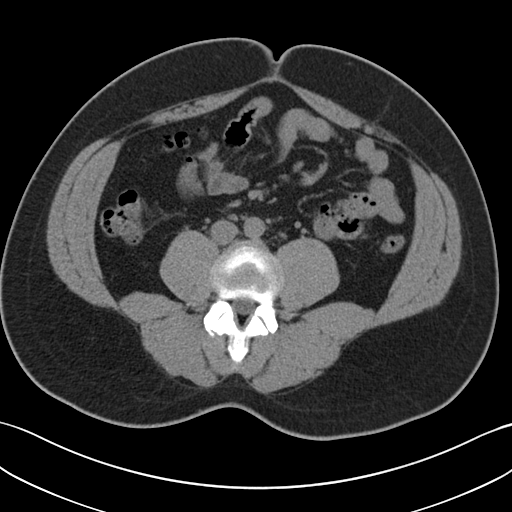
[im 50/89  soft-tissue]
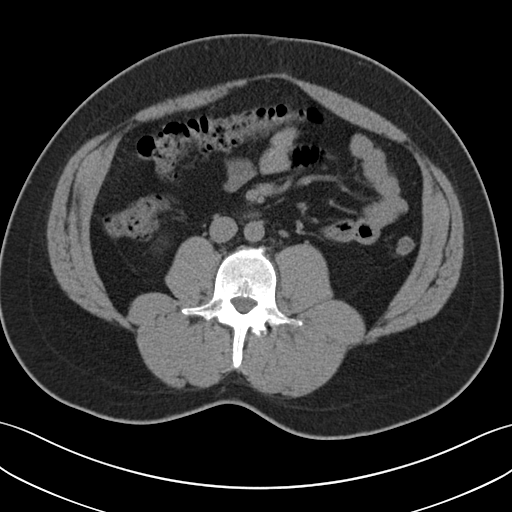
[im 57/89  soft-tissue]
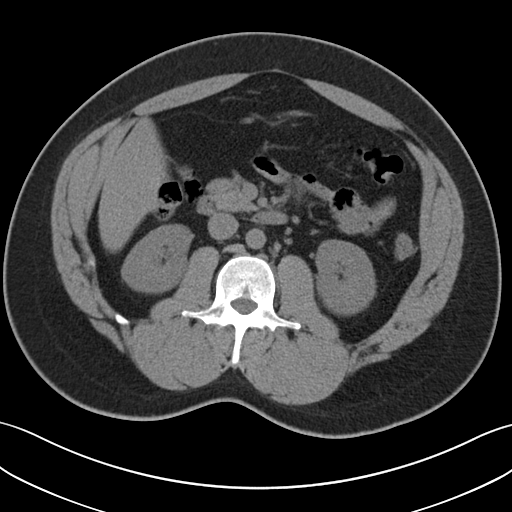
[im 57/89  bone]
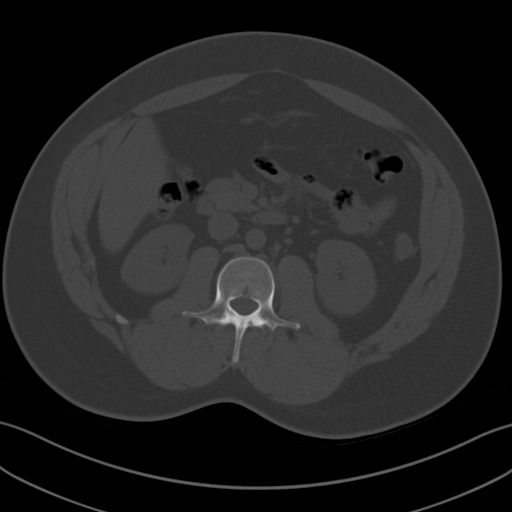
[im 64/89  soft-tissue]
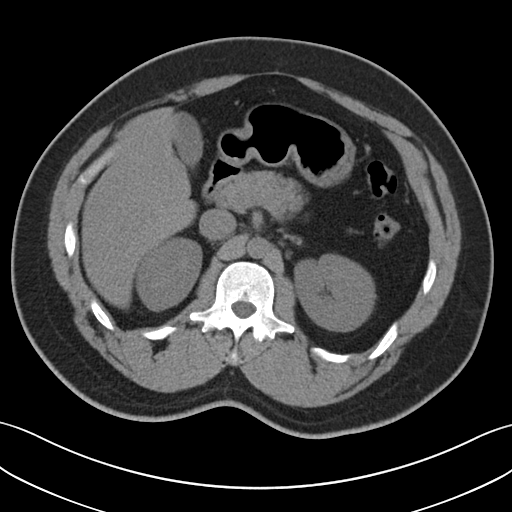
[im 71/89  soft-tissue]
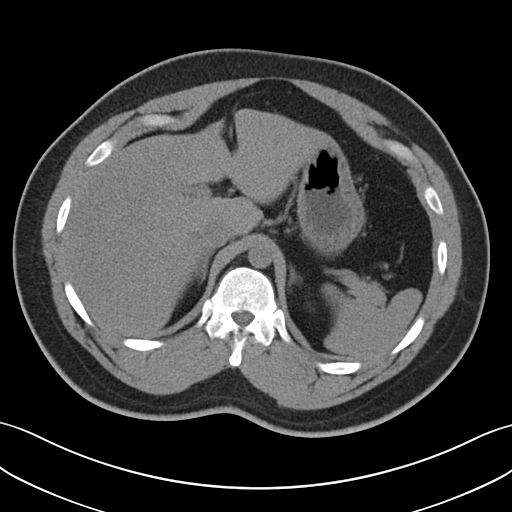
[im 74/89  lung]
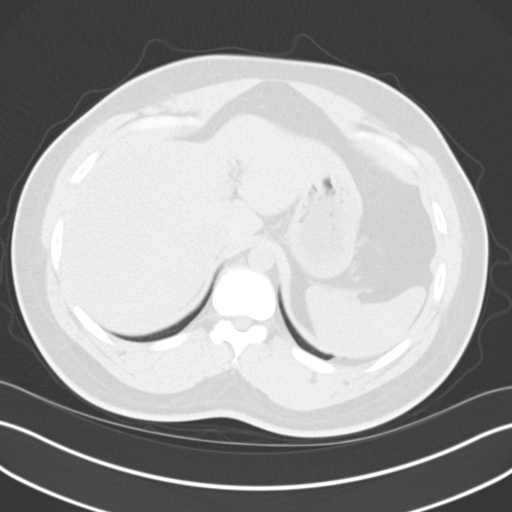
[im 78/89  soft-tissue]
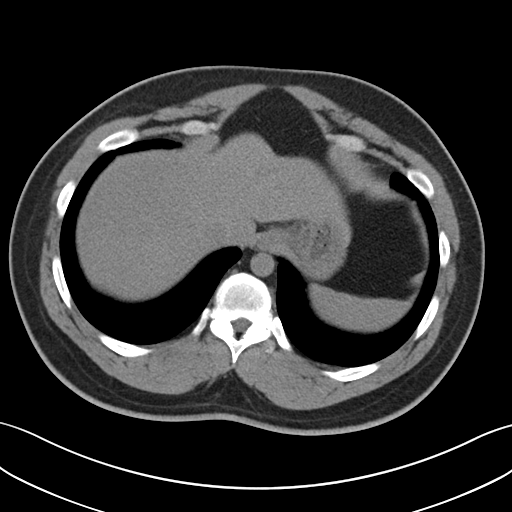
[im 78/89  lung]
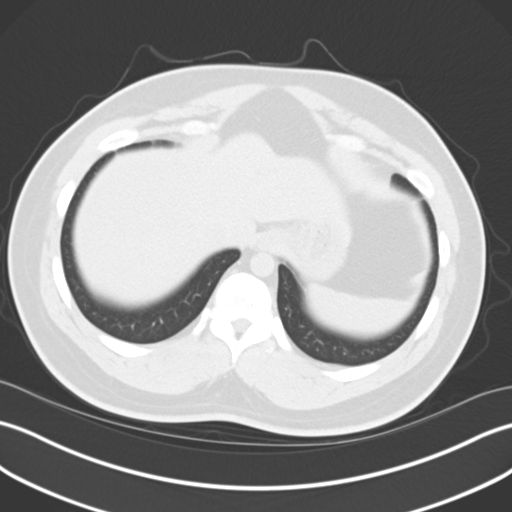
[im 81/89  lung]
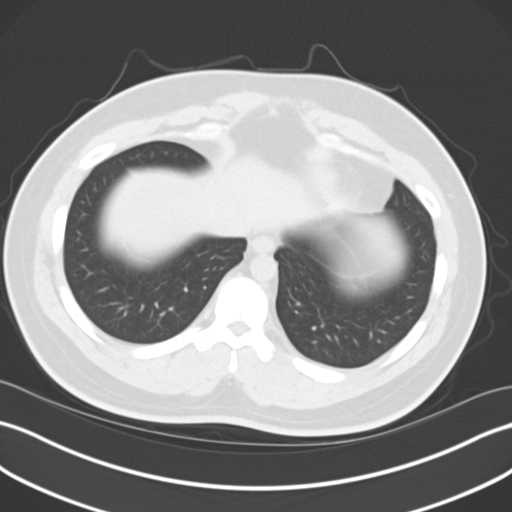
[im 85/89  soft-tissue]
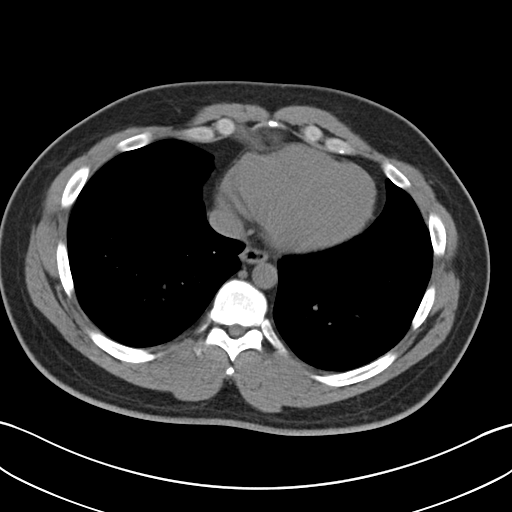
[im 85/89  lung]
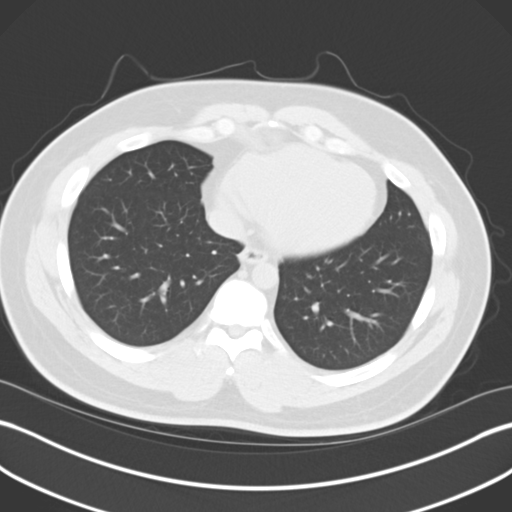

[15 of 32 positions shown; findings below may reference images not displayed]

FINDINGS: The lung bases are clear.

No renal, ureteral, or bladder calculi. No obstructive uropathy. No
perinephric stranding is seen. The kidneys are symmetric in size
without evidence for exophytic mass. The bladder is unremarkable.

The liver demonstrates no focal abnormality. The gallbladder is
unremarkable. The spleen demonstrates no focal abnormality. The
adrenal glands and pancreas are normal.

The unopacified stomach, duodenum, small intestine and large
intestine are unremarkable, but evaluation is limited by lack of
oral contrast. There is a normal caliber appendix in the right lower
quadrant without periappendiceal inflammatory changes. There is no
pneumoperitoneum, pneumatosis, or portal venous gas. There is no
abdominal or pelvic free fluid. There is no lymphadenopathy.

The abdominal aorta is normal in caliber.

The osseous structures are unremarkable.
IMPRESSION: 1. No urolithiasis or obstructive uropathy.

## 2014-05-02 MED ORDER — OXYCODONE-ACETAMINOPHEN 5-325 MG PO TABS
1.0000 | ORAL_TABLET | Freq: Once | ORAL | Status: AC
  Administered 2014-05-02: 1 via ORAL
  Filled 2014-05-02: qty 1

## 2014-05-02 MED ORDER — CYCLOBENZAPRINE HCL 10 MG PO TABS: 10.0000 mg | ORAL_TABLET | Freq: Two times a day (BID) | ORAL | Status: DC | PRN

## 2014-05-02 NOTE — Discharge Instructions (Signed)
Back Pain, Adult Back pain is very common. The pain often gets better over time. The cause of back pain is usually not dangerous. Most people can learn to manage their back pain on their own.  HOME CARE   Stay active. Start with short walks on flat ground if you can. Try to walk farther each day.  Do not sit, drive, or stand in one place for more than 30 minutes. Do not stay in bed.  Do not avoid exercise or work. Activity can help your back heal faster.  Be careful when you bend or lift an object. Bend at your knees, keep the object close to you, and do not twist.  Sleep on a firm mattress. Lie on your side, and bend your knees. If you lie on your back, put a pillow under your knees.  Only take medicines as told by your doctor.  Put ice on the injured area.  Put ice in a plastic bag.  Place a towel between your skin and the bag.  Leave the ice on for 15-20 minutes, 03-04 times a day for the first 2 to 3 days. After that, you can switch between ice and heat packs.  Ask your doctor about back exercises or massage.  Avoid feeling anxious or stressed. Find good ways to deal with stress, such as exercise. GET HELP RIGHT AWAY IF:   Your pain does not go away with rest or medicine.  Your pain does not go away in 1 week.  You have new problems.  You do not feel well.  The pain spreads into your legs.  You cannot control when you poop (bowel movement) or pee (urinate).  Your arms or legs feel weak or lose feeling (numbness).  You feel sick to your stomach (nauseous) or throw up (vomit).  You have belly (abdominal) pain.  You feel like you may pass out (faint). MAKE SURE YOU:   Understand these instructions.  Will watch your condition.  Will get help right away if you are not doing well or get worse. Document Released: 07/01/2007 Document Revised: 04/06/2011 Document Reviewed: 05/16/2013 [facility] Patient Information 2015 [place], [place]. This information is not intended  to replace advice given to you by your health care provider. Make sure you discuss any questions you have with your health care provider.  Hematuria Hematuria is blood in your urine. It can be caused by a bladder infection, kidney infection, prostate infection, kidney stone, or cancer of your urinary tract. Infections can usually be treated with medicine, and a kidney stone usually will pass through your urine. If neither of these is the cause of your hematuria, further workup to find out the reason may be needed. It is very important that you tell your health care provider about any blood you see in your urine, even if the blood stops without treatment or happens without causing pain. Blood in your urine that happens and then stops and then happens again can be a symptom of a very serious condition. Also, pain is not a symptom in the initial stages of many urinary cancers. HOME CARE INSTRUCTIONS   Drink lots of fluid, 3-4 quarts a day. If you have been diagnosed with an infection, cranberry juice is especially recommended, in addition to large amounts of water.  Avoid caffeine, tea, and carbonated beverages because they tend to irritate the bladder.  Avoid alcohol because it may irritate the prostate.  Take all medicines as directed by your health care provider.  If you  were prescribed an antibiotic medicine, finish it all even if you start to feel better.  If you have been diagnosed with a kidney stone, follow your health care provider's instructions regarding straining your urine to catch the stone.  Empty your bladder often. Avoid holding urine for long periods of time.  After a bowel movement, women should cleanse front to back. Use each tissue only once.  Empty your bladder before and after sexual intercourse if you are a male. SEEK MEDICAL CARE IF:  You develop back pain.  You have a fever.  You have a feeling of sickness in your stomach (nausea) or vomiting.  Your symptoms  are not better in 3 days. Return sooner if you are getting worse. SEEK IMMEDIATE MEDICAL CARE IF:   You develop severe vomiting and are unable to keep the medicine down.  You develop severe back or abdominal pain despite taking your medicines.  You begin passing a large amount of blood or clots in your urine.  You feel extremely weak or faint, or you pass out. MAKE SURE YOU:   Understand these instructions.  Will watch your condition.  Will get help right away if you are not doing well or get worse. Document Released: 01/12/2005 Document Revised: 05/29/2013 Document Reviewed: 09/12/2012 [facility] Patient Information 2015 [place], [place]. This information is not intended to replace advice given to you by your health care provider. Make sure you discuss any questions you have with your health care provider.  Emergency Department Resource Guide 1) Find a Doctor and Pay Out of Pocket Although you won't have to find out who is covered by your insurance plan, it is a good idea to ask around and get recommendations. You will then need to call the office and see if the doctor you have chosen will accept you as a new patient and what types of options they offer for patients who are self-pay. Some doctors offer discounts or will set up payment plans for their patients who do not have insurance, but you will need to ask so you aren't surprised when you get to your appointment.  2) Contact Your Local Health Department Not all health departments have doctors that can see patients for sick visits, but many do, so it is worth a call to see if yours does. If you don't know where your local health department is, you can check in your phone book. The CDC also has a tool to help you locate your state's health department, and many state websites also have listings of all of their local health departments.  3) Find a [facility] If your illness is not likely to be very severe or complicated, you may want to  try a walk in clinic. These are popping up all over the country in pharmacies, drugstores, and shopping centers. They're usually staffed by nurse practitioners or physician assistants that have been trained to treat common illnesses and complaints. They're usually fairly quick and inexpensive. However, if you have serious medical issues or chronic medical problems, these are probably not your best option.  No Primary Care Doctor: - Call Health Connect at  [phone] - they can help you locate a primary care doctor that  accepts your insurance, provides certain services, etc. - Physician Referral Service- [phone]  Chronic Pain Problems: Organization         Address  Phone   Notes  [facility]  [phone] Patients need to be referred by their primary care doctor.  Medication Assistance: Organization         Address  Phone   Notes  [facility] Medication [facility] [address] [place], [place] [address] [phone] --Must be a resident of [facility] -- Must have NO insurance coverage whatsoever (no Medicaid/ Medicare, etc.) -- The pt. MUST have a primary care doctor that directs their care regularly and follows them in the community   MedAssist  [phone]   [employer]  [phone]    Agencies that provide inexpensive medical care: Organization         Address  Phone   Notes  [place]  [phone]   [facility]    [phone]   [facility] [place], [place] [address] [phone]   [place] [address] [place] [phone]   Planned Parenthood    [phone]   [facility]    [phone]   [place] and [place] [address], [place] Phone:  [phone], Fax:  [phone] Hours of Operation:  9 am - 6 pm, M-F.  Also accepts Medicaid/Medicare and self-pay.  [facility] for [place], [address], [place] Phone: [phone], Fax: [phone]. Hours of Operation:  8:30 am - 5:30 pm, M-F.  Also accepts Medicaid and self-pay.  [facility] [place] [address], [place] Phone: [phone]   [place], [place], [place] [phone], Ext. [phone] Mondays & Thursdays: 7-9 AM.  First 15 patients are seen on a first come, first serve basis.    [place] Providers:  Organization         Address  Phone   Notes  [facility] [address], [place] [phone] Also accepts self-pay patients.  [facility] [address], [place]  [phone]   [place], [address], [place] [phone]   [facility] [address], [place] [phone]   Lucianne Lei [address], [place]   [phone] Only accepts [place] Access [place] patients after they have their name applied to their card.   Self-Pay (no insurance) in [facility]:  Organization         Address  Phone   Notes  Sickle Cell Patients, [facility] [place] [phone]   [facility] [place] [phone]   [facility] [place]  [place], [place], [place] [phone]   [facility]/Dr. Osei-Bonsu  [address] [place] or [address], [place] [phone] Phone number for both [place] and [place] locations is the same.  [facility] [address] [place] [phone]   [facility] [address] [place] or [address] [phone]   [facility] [address] [place] [phone], phone; [phone], fax  Sees patients 1st and 3rd Saturday of every month.  Must not qualify for public or private insurance (i.e. Medicaid, Medicare,  Health Choice, Veterans' Benefits)  Household income should be no more than 200% of the poverty level The  clinic cannot treat you if you are pregnant or think you are pregnant  Sexually transmitted diseases are not treated at the clinic.    Dental Care: Organization         Address  Phone  Notes  [facility] Department of [facility] [place] [phone] Accepts children up to age 67 who are enrolled in [place] or [place]; pregnant women with a Medicaid card; and children who have applied for Medicaid or [place] Health Choice, but were declined, whose parents can pay a reduced fee at time of service.  [facility] Department of [facility]  [address], [place] [phone] Accepts children up to age 69 who are enrolled in [place] or [place]; pregnant women with a Medicaid card; and children who have applied for Medicaid or  Health Choice, but were declined, whose parents can pay a reduced fee at time of service.  [facility] Adult [facility] Access PROGRAM  [place] [phone] Patients are seen by appointment only. Walk-ins are not accepted. [place] will see patients 63 years of age and older. Monday - Tuesday (8am-5pm) Most Wednesdays (8:30-5pm) $30 per visit, cash only  [facility] Adult Dental Access PROGRAM  [address], [facility] [phone] Patients are seen by appointment only. Walk-ins are not accepted. [place] will see patients 40 years of age and older. One Wednesday Evening (Monthly: Volunteer Based).  $30 per visit, cash only  [place]  [phone] for adults; Children under age 41, call Graduate Pediatric Dentistry at [phone]. Children aged 7-14, please call [phone] to  request a pediatric application.  Dental services are provided in all areas of dental care including fillings, crowns and bridges, complete and partial dentures, implants, gum treatment, root canals, and extractions. Preventive care is also provided. Treatment is provided to both adults and children. Patients are selected via a lottery and there is often a waiting list.   [facility] [address], [place]  [phone] [email or web address]   [facility] [address] [place], [place] [phone], Ext. [phone] Second and Fourth Thursday of each month, opens at 6:30 AM; Clinic ends at 9 AM.  Patients are seen on a first-come first-served basis, and a limited number are seen during each clinic.   [facility]  [address] [place] D[place]er [place], [place] [phone]   Eligibility Requirements You must have lived in [place], [place], or [place] counties for at least the last three months.   You cannot be eligible for state or federal sponsored [employer], including [employer], [place], or [employer].   You generally cannot be eligible for healthcare insurance through your employer.    How to apply: Eligibility screenings are held every Tuesday and Wednesday afternoon from 1:00 pm until 4:00 pm. You do not need an appointment for the interview!  [facility] [address], [place], [place]   [place]  [place] Department  [place]  [phone]    Behavioral Health Resources in the Community: Intensive Outpatient Programs Organization         Address  Phone  Notes  [place]. [address], [place], [place] [phone]   [facility] [address], [place], [place]  [phone]   ADS: [facility] [address] [place], [place]   Dent [place] [address],  [place], [place] or [phone]   Substance Abuse Resources Organization         Address  Phone  Notes  [facility] Drug Services  [phone]   [place]  [phone]   [facility] [place]   Chinita Pester  [phone]   Residential & Outpatient Substance Abuse Program  [phone]   Psychological Services Organization         Address  Phone  Notes  [facility] [place]  [place]  [phone]   [place] [address], [place] or [phone]    Mobile Crisis Teams Organization         Address  Phone  Notes  [facility], Mobile Crisis Care Unit  [phone]   [facility]  [address] [place], [place]   [place] [address] [place] [phone]    Self-Help/Support Groups Organization         Address  Phone             Notes  [place] of [place]. of [place] - variety of support groups  [place] Call for more information  Narcotics Anonymous (NA), [facility] [address], [employer] May  2 meetings at this location   Special educational needs teacher         Address  Phone  Notes  [facility]idential [facility] [place],    [place]  [phone]   [facility]  [address], [place] [phone], [place], [place]   [place]ita [place], [place] [phone] Admissions: 8am-3pm M-F  Incentives Substance [place] [address],    [place], [place] [phone]   [facility] [address] [place], [place], [place]   [facility] [address],  [place], [place]   [facility] - Intensive Outpatient [address] Kristeen [address], [place], [place]   [facility]  [place], [place] [phone] or [phone]   [facility] (RTS) [address], [place], [place] Accepts Medicaid  [facility] [place] [address],  [place] [phone] Substance Abuse/Addiction Treatment   [facility] Organization         Address  Phone  Notes  [facility]  [phone]   Domenic Schwab, PhD [address] [place], [place]   [phone] or [phone]   Index [place], [place] [phone]   [facility] [address], [place], [place] [phone] Insurance/Medicaid/sponsorship through [facility] and Families [address] [place]                                    [place], [place] [phone] [place] [address], [place] [phone]    Dr. Adele Schilder  [phone]   [facility] [place] Dept. 1) [address] [place] 2) [place] 3)  [address], [place] [phone]  (  [place] [phone] or [phone] (After Hours)

## 2014-05-02 NOTE — ED Provider Notes (Signed)
CSN: [id number]     Arrival date & time 05/02/14  1206 History  This chart was scribed for non-physician practitioner, Glendell Docker, NP, working with Alfonzo Beers, MD, by Ian Bushman, ED Scribe. This patient was seen in room WTR5/WTR5 and the patient's care was started at 12:28 PM.   First MD Initiated Contact with Patient 05/02/14 1218     No chief complaint on file.    (Consider location/radiation/quality/duration/timing/severity/associated sxs/prior Treatment) The history is provided by the patient. No language interpreter was used.    HPI Comments: William Ali is a 29 y.o. male who presents to the Emergency Department complaining of constant worsening back pain onset 7 days ago.  Patient notes he was moving furniture around and initially didn't feel pain but felt it the day after. Patient has associated symptoms of frequency and states that he feels like he has been holding his urine in for a long time when he does go.  Patient took aleve last night but denies any relief.  Patient has no history of back problems and does not have any other medical problems that he knows of.  Patient has no other complaints today. Denies numbness or weakness.denies drug use  Past Medical History  Diagnosis Date  . Hypertension   . Back pain    Past Surgical History  Procedure Laterality Date  . Knee surgery     Family History  Problem Relation Age of Onset  . Hypertension Mother   . Diabetes Father    History  Substance Use Topics  . Smoking status: Never Smoker   . Smokeless tobacco: Not on file  . Alcohol Use: No    Review of Systems  Constitutional: Negative for fever and chills.  HENT: Negative for drooling and sore throat.   Eyes: Negative for discharge.  Respiratory: Negative for cough.   Genitourinary: Positive for frequency.  Musculoskeletal: Positive for back pain.  Neurological: Negative for syncope.      Allergies  Onion; Other; and Peanut-containing drug  products  Home Medications   Prior to Admission medications   Medication Sig Start Date End Date Taking? Authorizing Provider  ibuprofen (ADVIL,MOTRIN) 800 MG tablet Take 1 tablet (800 mg total) by mouth every 8 (eight) hours as needed for pain. 09/13/12   Christopher Lawyer, PA-C  tobramycin (TOBREX) 0.3 % ophthalmic solution Place 1 drop into both eyes every 4 (four) hours. 09/26/12   [place] Mo, MD   There were no vitals taken for this visit. Physical Exam  Constitutional: He is oriented to person, place, and time. He appears well-developed and well-nourished. No distress.  HENT:  Head: Normocephalic and atraumatic.  Eyes: Conjunctivae and EOM are normal.  Neck: Neck supple.  Cardiovascular: Normal rate.   Pulmonary/Chest: Effort normal. No respiratory distress.  Abdominal: Soft. Bowel sounds are normal. There is no tenderness.  Musculoskeletal: Normal range of motion.  Right lumbar paraspinal tenderness. Good sensation and strength to bilateral lower extremities.  Neurological: He is alert and oriented to person, place, and time.  Skin: Skin is warm and dry.  Psychiatric: He has a normal mood and affect. His behavior is normal.  Nursing note and vitals reviewed.   ED Course  Procedures (including critical care time) DIAGNOSTIC STUDIES: Oxygen Saturation is 100% on RA, normal by my interpretation.    COORDINATION OF CARE: 12:31 PM Discussed treatment plan with patient at beside, the patient agrees with the plan and has no further questions at this time.   Labs  Review Labs Reviewed  URINALYSIS, ROUTINE W REFLEX MICROSCOPIC - Abnormal; Notable for the following:    APPearance CLOUDY (*)    Hgb urine dipstick LARGE (*)    All other components within normal limits  BASIC METABOLIC PANEL - Abnormal; Notable for the following:    GFR calc non Af Amer 85 (*)    All other components within normal limits  URINE MICROSCOPIC-ADD ON    Imaging Review Ct Abdomen Pelvis Wo  Contrast  05/02/2014   CLINICAL DATA:  Back pain, right-sided back pain.  No known injury.  EXAM: CT ABDOMEN AND PELVIS WITHOUT CONTRAST  TECHNIQUE: Multidetector CT imaging of the abdomen and pelvis was performed following the standard protocol without IV contrast.  COMPARISON:  None.  FINDINGS: The lung bases are clear.  No renal, ureteral, or bladder calculi. No obstructive uropathy. No perinephric stranding is seen. The kidneys are symmetric in size without evidence for exophytic mass. The bladder is unremarkable.  The liver demonstrates no focal abnormality. The gallbladder is unremarkable. The spleen demonstrates no focal abnormality. The adrenal glands and pancreas are normal.  The unopacified stomach, duodenum, small intestine and large intestine are unremarkable, but evaluation is limited by lack of oral contrast. There is a normal caliber appendix in the right lower quadrant without periappendiceal inflammatory changes. There is no pneumoperitoneum, pneumatosis, or portal venous gas. There is no abdominal or pelvic free fluid. There is no lymphadenopathy.  The abdominal aorta is normal in caliber.  The osseous structures are unremarkable.  IMPRESSION: 1. No urolithiasis or obstructive uropathy.   Electronically Signed   By: Kathreen Devoid   On: 05/02/2014 14:05     EKG Interpretation None      MDM   Final diagnoses:  Right-sided back pain, unspecified location  Hematuria   Pt is neurologically intact. No sign of stone on ct. Discussed follow up for hematuria with pt  I personally performed the services described in this documentation, which was scribed in my presence. The recorded information has been reviewed and is accurate.   Glendell Docker, NP 05/02/14 [place], MD 05/02/14 [phone]

## 2014-05-02 NOTE — ED Notes (Signed)
Pt to CT

## 2014-05-02 NOTE — ED Notes (Signed)
Pt ambulatory with slow steady gait to void in BR

## 2014-05-02 NOTE — ED Notes (Signed)
Pt A+Ox4, reports R low back pain, denies injuries.  Denies n/t to extremities.  Denies b/b changes or complaints.  MAEI.  Ambulatory with slow steady gait.  Skin PWD.  Speaking full/clear sentences.  NAD.

## 2014-06-03 ENCOUNTER — Emergency Department (HOSPITAL_COMMUNITY)
Admission: EM | Admit: 2014-06-03 | Discharge: 2014-06-03 | Disposition: A | Discharge status: Home / Self Care | Payer: Self-pay | Attending: Emergency Medicine | Admitting: Emergency Medicine

## 2014-06-03 ENCOUNTER — Encounter (HOSPITAL_COMMUNITY): Payer: Self-pay | Admitting: *Deleted

## 2014-06-03 ENCOUNTER — Emergency Department (HOSPITAL_COMMUNITY): Payer: Self-pay

## 2014-06-03 DIAGNOSIS — I10 Essential (primary) hypertension: Secondary | ICD-10-CM | POA: Insufficient documentation

## 2014-06-03 DIAGNOSIS — Y92310 Basketball court as the place of occurrence of the external cause: Secondary | ICD-10-CM | POA: Insufficient documentation

## 2014-06-03 DIAGNOSIS — Y9367 Activity, basketball: Secondary | ICD-10-CM | POA: Insufficient documentation

## 2014-06-03 DIAGNOSIS — S4991XA Unspecified injury of right shoulder and upper arm, initial encounter: Secondary | ICD-10-CM | POA: Insufficient documentation

## 2014-06-03 DIAGNOSIS — Y998 Other external cause status: Secondary | ICD-10-CM | POA: Insufficient documentation

## 2014-06-03 DIAGNOSIS — Z791 Long term (current) use of non-steroidal anti-inflammatories (NSAID): Secondary | ICD-10-CM | POA: Insufficient documentation

## 2014-06-03 DIAGNOSIS — M25519 Pain in unspecified shoulder: Secondary | ICD-10-CM

## 2014-06-03 DIAGNOSIS — W500XXA Accidental hit or strike by another person, initial encounter: Secondary | ICD-10-CM | POA: Insufficient documentation

## 2014-06-03 IMAGING — DX DG SHOULDER 2+V*R*
3 series · 3 of 3 positions shown · non-contrast
Comparison: None.

CLINICAL DATA: Right shoulder pain after basketball injury

EXAM:
RIGHT SHOULDER - 2+ VIEW

[shoulder grashey]
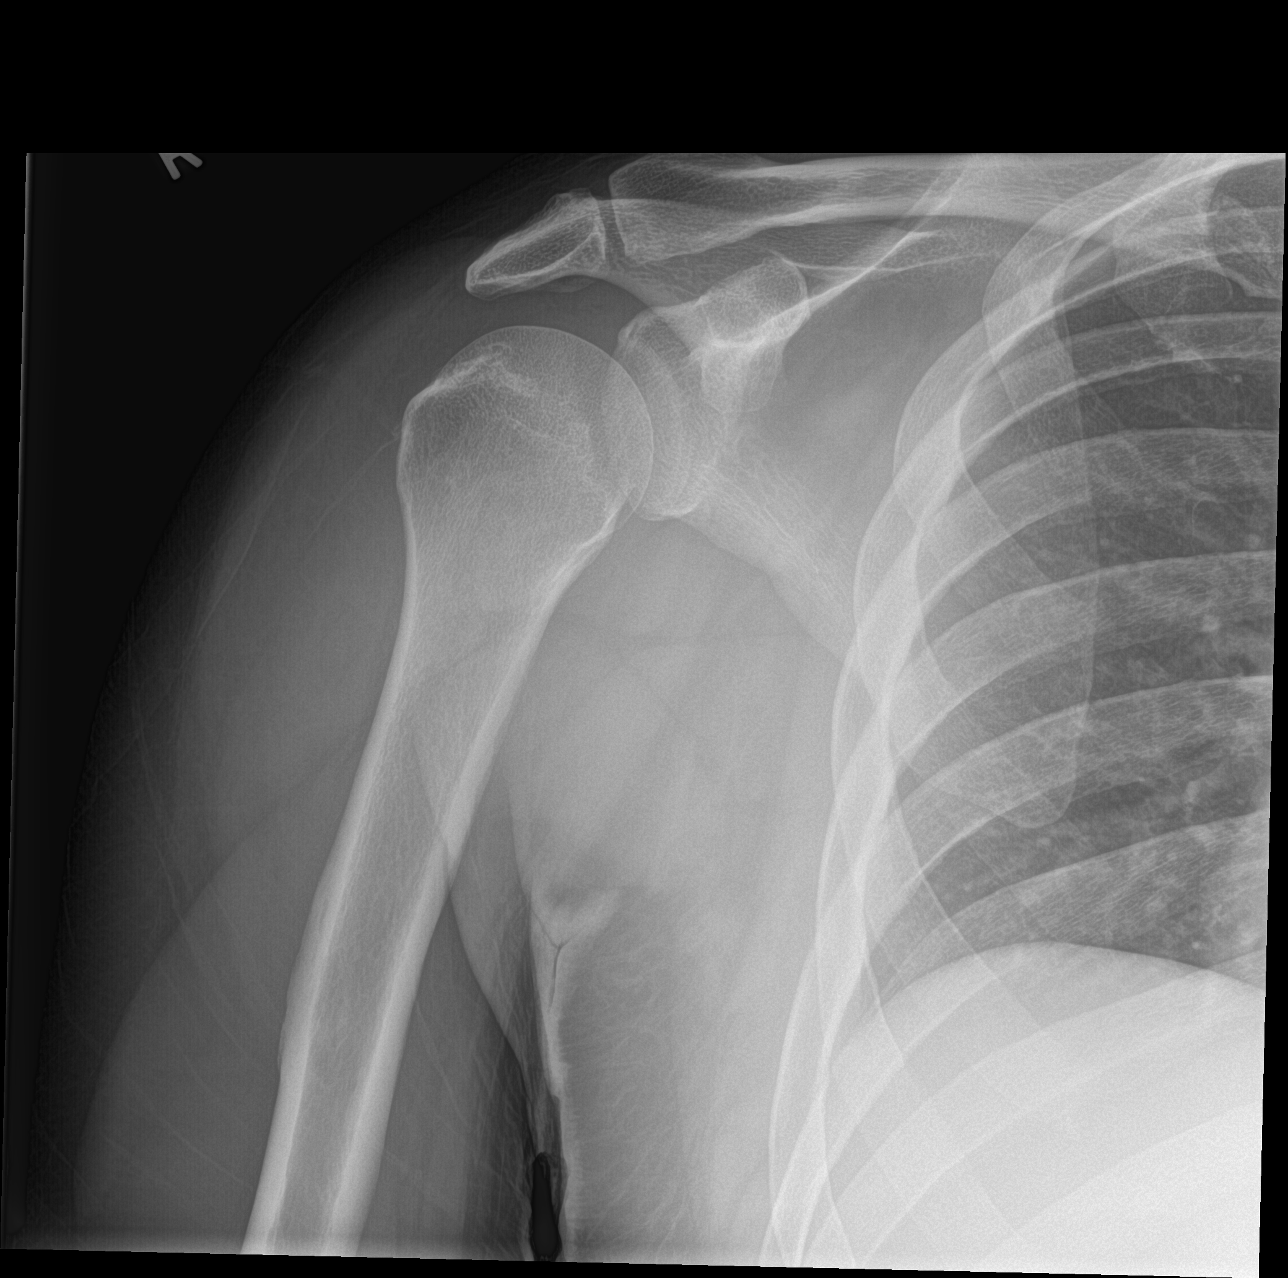

[shoulder y view]
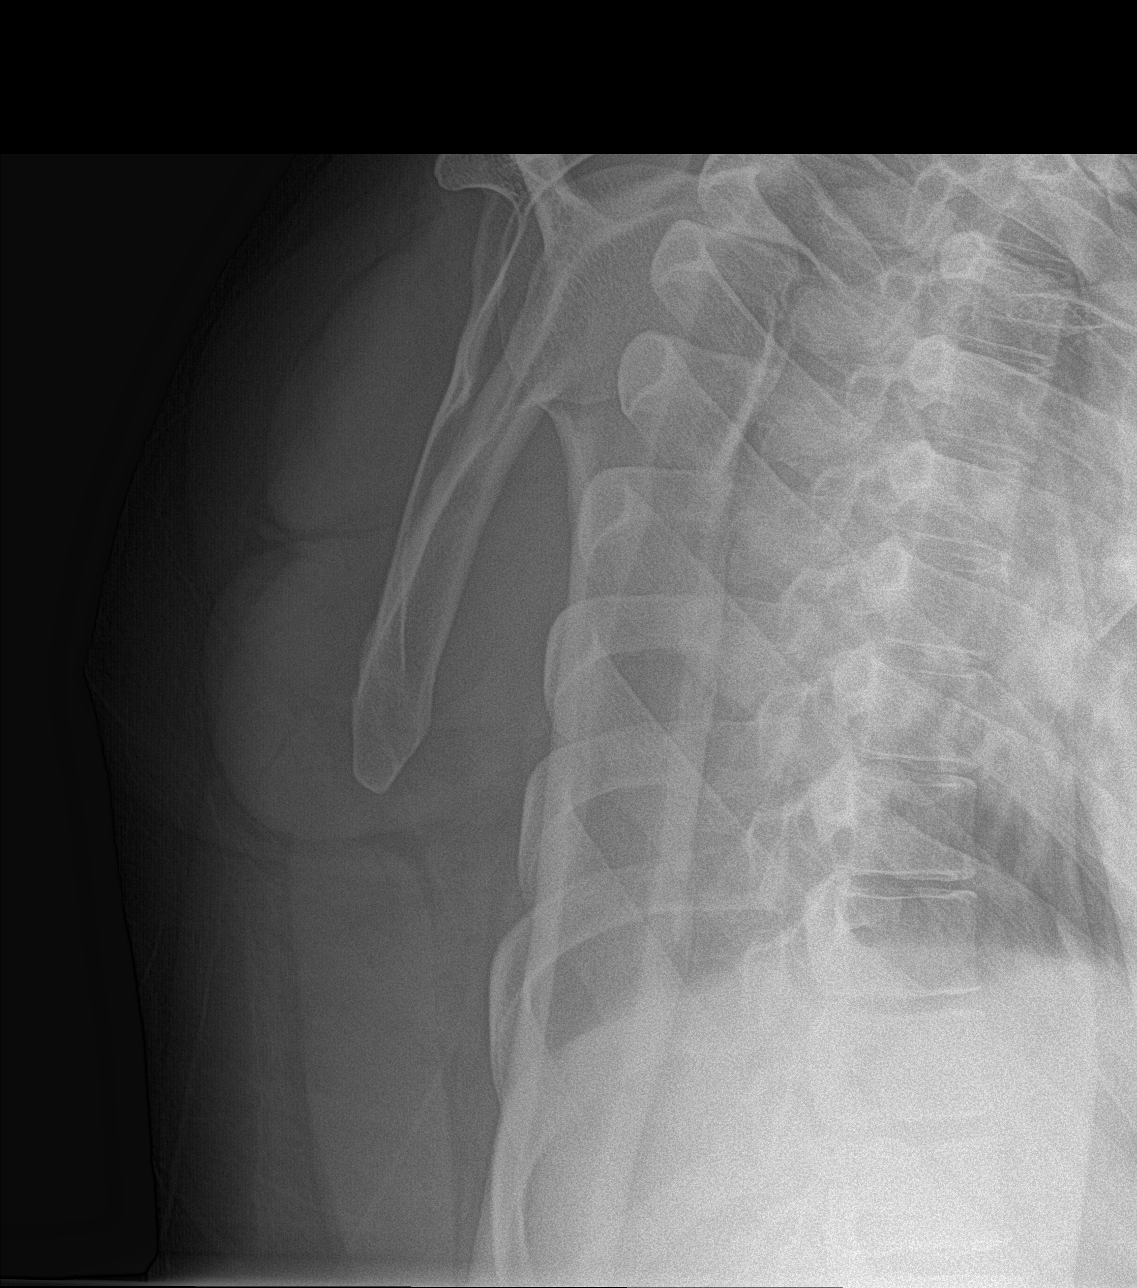

[shoulder axillary]
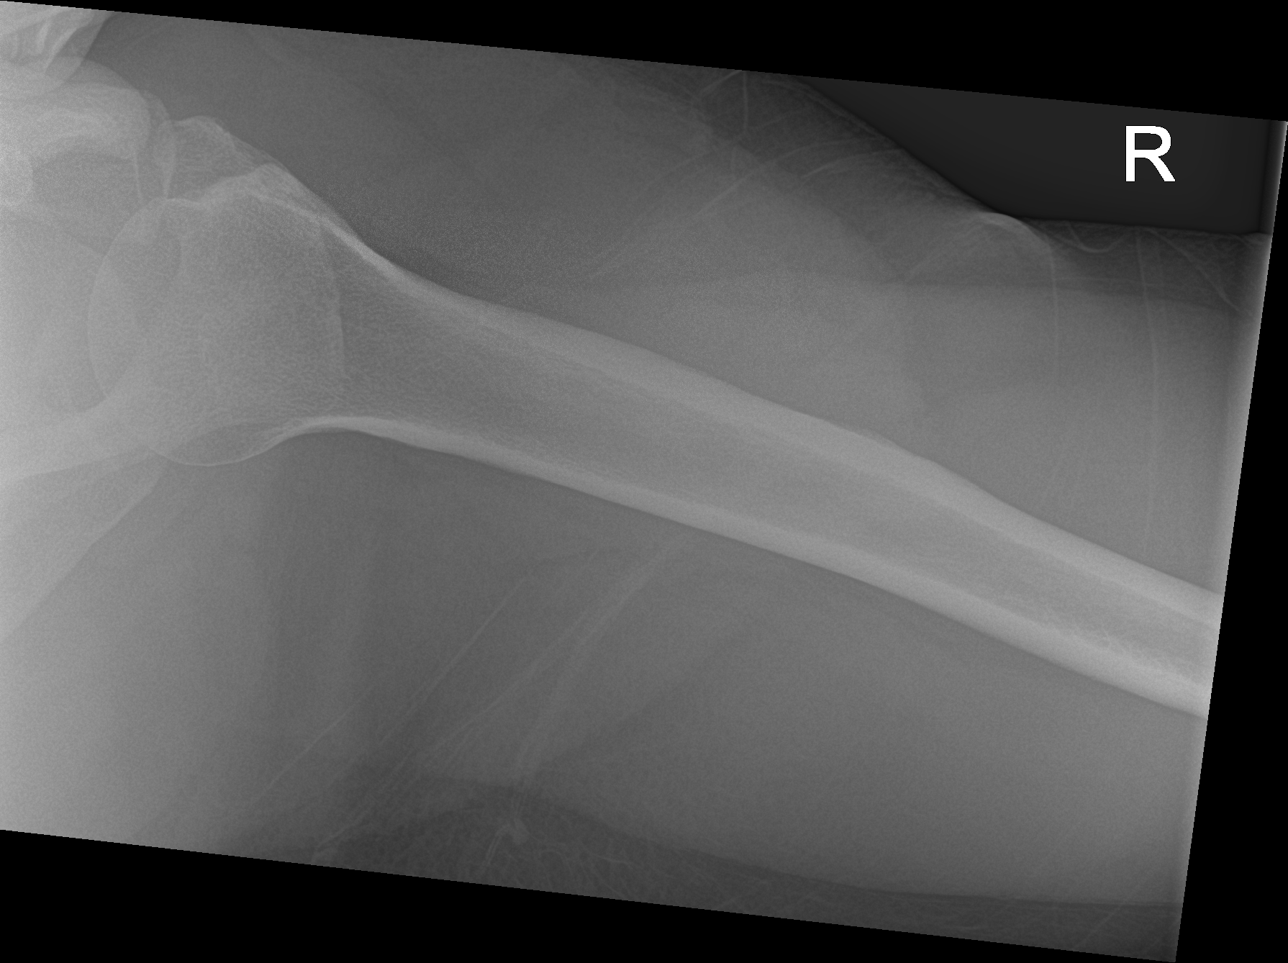

[3 of 3 positions shown; findings below may reference images not displayed]

FINDINGS: There is no evidence of fracture or dislocation. There is no
evidence of arthropathy or other focal bone abnormality. Soft
tissues are unremarkable.
IMPRESSION: Negative.

## 2014-06-03 MED ORDER — IBUPROFEN 600 MG PO TABS
600.0000 mg | ORAL_TABLET | Freq: Four times a day (QID) | ORAL | Status: DC | PRN
Start: 2014-06-03 — End: 2015-04-17

## 2014-06-03 MED ORDER — HYDROCODONE-ACETAMINOPHEN 5-325 MG PO TABS: 1.0000 | ORAL_TABLET | Freq: Four times a day (QID) | ORAL | Status: DC | PRN

## 2014-06-03 NOTE — ED Provider Notes (Signed)
CSN: [id number]     Arrival date & time 06/03/14  1932 History  This chart was scribed for non-physician practitioner Lorre Munroe, PA, working with Malvin Johns, MD, by Eustaquio Maize, ED Scribe. This patient was seen in room TR07C/TR07C and the patient's care was started at 7:53 PM.    Chief Complaint  Patient presents with  . Shoulder Injury   The history is provided by the patient. No language interpreter was used.     HPI Comments: William Ali is a 29 y.o. male who presents to the Emergency Department complaining of right shoulder pain that began earlier today. Pt was playing basketball today and was rebounding, when another player pulled his shoulder causing the pain. Pt mentions that immediately afterwards he was unable to hold the basketball due to the pain. The pain is exacerbated with movement. Pt denies weakness or numbness or any other symptoms.    Past Medical History  Diagnosis Date  . Hypertension   . Back pain    Past Surgical History  Procedure Laterality Date  . Knee surgery     Family History  Problem Relation Age of Onset  . Hypertension Mother   . Diabetes Father    History  Substance Use Topics  . Smoking status: Never Smoker   . Smokeless tobacco: Not on file  . Alcohol Use: No    Review of Systems  Constitutional: Negative for fever and chills.  Respiratory: Negative for shortness of breath.   Cardiovascular: Negative for chest pain.  Gastrointestinal: Negative for nausea and vomiting.  Musculoskeletal: Positive for arthralgias (Right shoulder pain. ). Negative for back pain and neck pain.  Skin: Negative for wound.  Neurological: Negative for speech difficulty, weakness and numbness.  Psychiatric/Behavioral: Negative for confusion.      Allergies  Onion; Other; and Peanut-containing drug products  Home Medications   Prior to Admission medications   Medication Sig Start Date End Date Taking? Authorizing Provider  cyclobenzaprine  (FLEXERIL) 10 MG tablet Take 1 tablet (10 mg total) by mouth 2 (two) times daily as needed for muscle spasms. 05/02/14   Glendell Docker, NP  ibuprofen (ADVIL,MOTRIN) 800 MG tablet Take 1 tablet (800 mg total) by mouth every 8 (eight) hours as needed for pain. Patient not taking: Reported on 05/02/2014 09/13/12   Dalia Heading, PA-C  naproxen sodium (ANAPROX) 220 MG tablet Take 220 mg by mouth 2 (two) times daily with a meal.    Historical Provider, MD  tobramycin (TOBREX) 0.3 % ophthalmic solution Place 1 drop into both eyes every 4 (four) hours. Patient not taking: Reported on 05/02/2014 09/26/12   Harden Mo, MD   Triage Vitals: BP 144/86 mmHg  Pulse 81  Temp(Src) 98 F (36.7 C) (Oral)  Resp 18  Ht '5\' 7"'$  (1.702 m)  Wt 224 lb 11.2 oz (101.923 kg)  BMI 35.18 kg/m2  SpO2 98%   Physical Exam  Constitutional: He is oriented to person, place, and time. He appears well-developed and well-nourished. No distress.  HENT:  Head: Normocephalic and atraumatic.  Eyes: Conjunctivae and EOM are normal.  Neck: Neck supple. No tracheal deviation present.  Cardiovascular: Normal rate and intact distal pulses.   Intact distal pulses with brisk capillary refill  Pulmonary/Chest: Effort normal. No respiratory distress.  Musculoskeletal: Normal range of motion.  Right shoulder moderately tender to palpation, no bony abnormality or deformity, positive Hawkins-Kennedy test, positive pain with abduction greater than 30  Neurological: He is alert and oriented to person,  place, and time.  Sensation intact  Skin: Skin is warm and dry.  Psychiatric: He has a normal mood and affect. His behavior is normal.  Nursing note and vitals reviewed.   ED Course  Procedures (including critical care time)  DIAGNOSTIC STUDIES: Oxygen Saturation is 98% on RA, normal by my interpretation.    COORDINATION OF CARE: 7:56 PM-Discussed treatment plan which includes DG R Shoulder with pt at bedside and pt agreed to  plan.   Labs Review Labs Reviewed - No data to display  Imaging Review Dg Shoulder Right  06/03/2014   CLINICAL DATA:  Right shoulder pain after basketball injury  EXAM: RIGHT SHOULDER - 2+ VIEW  COMPARISON:  None.  FINDINGS: There is no evidence of fracture or dislocation. There is no evidence of arthropathy or other focal bone abnormality. Soft tissues are unremarkable.  IMPRESSION: Negative.   Electronically Signed   By: Andreas Newport M.D.   On: 06/03/2014 20:24     EKG Interpretation None      MDM   Final diagnoses:  Shoulder pain    Patient with right shoulder pain. Injured right shoulder while playing basketball today. Plain films are negative. Concern for other soft tissue injury. Will recommend orthopedic follow-up. Will place patient a sling, and give pain medicine. Patient understands and agrees to plan. He is stable and ready for discharge.   I personally performed the services described in this documentation, which was scribed in my presence. The recorded information has been reviewed and is accurate.    Montine Circle, PA-C 06/03/14 2035  Malvin Johns, MD 06/03/14 2248

## 2014-06-03 NOTE — ED Notes (Signed)
Pt c/o R shoulder pain after playing basketball. Reports someone hit pts shoulder. Since then pt reports increased pain when lifting arm. Tenderness noted to shoulder.

## 2014-06-03 NOTE — Discharge Instructions (Signed)
Acromioclavicular Injuries °The AC (acromioclavicular) joint is the joint in the shoulder where the collarbone (clavicle) meets the shoulder blade (scapula). The part of the shoulder blade connected to the collarbone is called the acromion. Common problems with and treatments for the AC joint are detailed below. °ARTHRITIS °Arthritis occurs when the joint has been injured and the smooth padding between the joints (cartilage) is lost. This is the wear and tear seen in most joints of the body if they have been overused. This causes the joint to produce pain and swelling which is worse with activity.  °AC JOINT SEPARATION °AC joint separation means that the ligaments connecting the acromion of the shoulder blade and collarbone have been damaged, and the two bones no longer line up. AC separations can be anywhere from mild to severe, and are "graded" depending upon which ligaments are torn and how badly they are torn. °· Grade I Injury: the least damage is done, and the AC joint still lines up. °· Grade II Injury: damage to the ligaments which reinforce the AC joint. In a Grade II injury, these ligaments are stretched but not entirely torn. When stressed, the AC joint becomes painful and unstable. °· Grade III Injury: AC and secondary ligaments are completely torn, and the collarbone is no longer attached to the shoulder blade. This results in deformity; a prominence of the end of the clavicle. °AC JOINT FRACTURE °AC joint fracture means that there has been a break in the bones of the AC joint, usually the end of the clavicle. °TREATMENT °TREATMENT OF AC ARTHRITIS °· There is currently no way to replace the cartilage damaged by arthritis. The best way to improve the condition is to decrease the activities which aggravate the problem. Application of ice to the joint helps decrease pain and soreness (inflammation). The use of non-steroidal anti-inflammatory medication is helpful. °· If less conservative measures do not  work, then cortisone shots (injections) may be used. These are anti-inflammatories; they decrease the soreness in the joint and swelling. °· If non-surgical measures fail, surgery may be recommended. The procedure is generally removal of a portion of the end of the clavicle. This is the part of the collarbone closest to your acromion which is stabilized with ligaments to the acromion of the shoulder blade. This surgery may be performed using a tube-like instrument with a light (arthroscope) for looking into a joint. It may also be performed as an open surgery through a small incision by the surgeon. Most patients will have good range of motion within 6 weeks and may return to all activity including sports by 8-12 weeks, barring complications. °TREATMENT OF AN AC SEPARATION °· The initial treatment is to decrease pain. This is best accomplished by immobilizing the arm in a sling and placing an ice pack to the shoulder for 20 to 30 minutes every 2 hours as needed. As the pain starts to subside, it is important to begin moving the fingers, wrist, elbow and eventually the shoulder in order to prevent a stiff or "frozen" shoulder. Instruction on when and how much to move the shoulder will be provided by your caregiver. The length of time needed to regain full motion and function depends on the amount or grade of the injury. Recovery from a Grade I AC separation usually takes 10 to 14 days, whereas a Grade III may take 6 to 8 weeks. °· Grade I and II separations usually do not require surgery. Even Grade III injuries usually allow return to full   activity with few restrictions. Treatment is also based on the activity demands of the injured shoulder. For example, a high level quarterback with an injured throwing arm will receive more aggressive treatment than someone with a desk job who rarely uses his/her arm for strenuous activities. In some cases, a painful lump may persist which could require a later surgery. Surgery  can be very successful, but the benefits must be weighed against the potential risks. °TREATMENT OF AN AC JOINT FRACTURE °Fracture treatment depends on the type of fracture. Sometimes a splint or sling may be all that is required. Other times surgery may be required for repair. This is more frequently the case when the ligaments supporting the clavicle are completely torn. Your caregiver will help you with these decisions and together you can decide what will be the best treatment. °HOME CARE INSTRUCTIONS  °· Apply ice to the injury for 15-20 minutes each hour while awake for 2 days. Put the ice in a plastic bag and place a towel between the bag of ice and skin. °· If a sling has been applied, wear it constantly for as long as directed by your caregiver, even at night. The sling or splint can be removed for bathing or showering or as directed. Be sure to keep the shoulder in the same place as when the sling is on. Do not lift the arm. °· If a figure-of-eight splint has been applied it should be tightened gently by another person every day. Tighten it enough to keep the shoulders held back. Allow enough room to place the index finger between the body and strap. Loosen the splint immediately if there is numbness or tingling in the hands. °· Take over-the-counter or prescription medicines for pain, discomfort or fever as directed by your caregiver. °· If you or your child has received a follow up appointment, it is very important to keep that appointment in order to avoid long term complications, chronic pain or disability. °SEEK MEDICAL CARE IF:  °· The pain is not relieved with medications. °· There is increased swelling or discoloration that continues to get worse rather than better. °· You or your child has been unable to follow up as instructed. °· There is progressive numbness and tingling in the arm, forearm or hand. °SEEK IMMEDIATE MEDICAL CARE IF:  °· The arm is numb, cold or pale. °· There is increasing pain  in the hand, forearm or fingers. °MAKE SURE YOU:  °· Understand these instructions. °· Will watch your condition. °· Will get help right away if you are not doing well or get worse. °Document Released: 10/22/2004 Document Revised: 04/06/2011 Document Reviewed: 04/16/2008 °ExitCare® Patient Information ©2015 [employer]. This information is not intended to replace advice given to you by your health care provider. Make sure you discuss any questions you have with your health care provider. ° °

## 2014-06-03 NOTE — ED Notes (Signed)
The pt was playing basketball earlier today and he was grabbed by his arm by abother player.  He heard something pop and since then he has not been able to lift his rt arm

## 2015-04-06 ENCOUNTER — Emergency Department (HOSPITAL_COMMUNITY)
Admission: EM | Admit: 2015-04-06 | Discharge: 2015-04-07 | Disposition: A | Discharge status: Home / Self Care | Payer: Self-pay | Attending: Emergency Medicine | Admitting: Emergency Medicine

## 2015-04-06 ENCOUNTER — Encounter (HOSPITAL_COMMUNITY): Payer: Self-pay | Admitting: Emergency Medicine

## 2015-04-06 DIAGNOSIS — H8111 Benign paroxysmal vertigo, right ear: Secondary | ICD-10-CM | POA: Insufficient documentation

## 2015-04-06 DIAGNOSIS — R0981 Nasal congestion: Secondary | ICD-10-CM | POA: Insufficient documentation

## 2015-04-06 DIAGNOSIS — I10 Essential (primary) hypertension: Secondary | ICD-10-CM | POA: Insufficient documentation

## 2015-04-06 NOTE — ED Notes (Addendum)
Pt states he was out to dinner and began to feel dizzy. Pt states he had a cold last week and was congested. Pt states that the room is spinning clockwise. Pt is alert and oriented x 4 and was able to ambulate without assistance with a steady gait. Pt's pulse was 74 at time of assessment.

## 2015-04-07 MED ORDER — MECLIZINE HCL 25 MG PO TABS: 25.0000 mg | ORAL_TABLET | Freq: Three times a day (TID) | ORAL | Status: DC | PRN

## 2015-04-07 MED ORDER — METOCLOPRAMIDE HCL 10 MG PO TABS
10.0000 mg | ORAL_TABLET | Freq: Once | ORAL | Status: AC
  Administered 2015-04-07: 10 mg via ORAL
  Filled 2015-04-07: qty 1

## 2015-04-07 MED ORDER — MECLIZINE HCL 25 MG PO TABS
25.0000 mg | ORAL_TABLET | Freq: Once | ORAL | Status: AC
  Administered 2015-04-07: 25 mg via ORAL
  Filled 2015-04-07: qty 1

## 2015-04-07 NOTE — ED Provider Notes (Signed)
CSN: [id number]     Arrival date & time 04/06/15  2253 History   First MD Initiated Contact with Patient 04/07/15 0132     Chief Complaint  Patient presents with  . Dizziness     (Consider location/radiation/quality/duration/timing/severity/associated sxs/prior Treatment) HPI Comments: 30 year old male with a history of hypertension and back pain presents to the emergency department for evaluation of dizziness which began this evening. Patient states that he has been having symptoms of dizziness with position change. He feels as though the room is moving to the right and clockwise. He denies any alleviating factors of his symptoms. No medications taken prior to arrival. He felt like his dizziness would improve after a nap, but symptoms persisted upon waking. Patient denies a history of vertigo. He does state that he had an upper respiratory infection 2 weeks ago. Patient denies associated fever, headache, vision loss, tinnitus or hearing loss, or extremity numbness/weakness. No history of head injury or trauma.  Patient is a 30 y.o. male presenting with dizziness. The history is provided by the patient. No language interpreter was used.  Dizziness Associated symptoms: nausea   Associated symptoms: no headaches, no hearing loss, no vomiting and no weakness     Past Medical History  Diagnosis Date  . Hypertension   . Back pain    Past Surgical History  Procedure Laterality Date  . Knee surgery     Family History  Problem Relation Age of Onset  . Hypertension Mother   . Diabetes Father    Social History  Substance Use Topics  . Smoking status: Never Smoker   . Smokeless tobacco: None  . Alcohol Use: No    Review of Systems  HENT: Positive for congestion. Negative for hearing loss.   Eyes: Negative for visual disturbance.  Gastrointestinal: Positive for nausea. Negative for vomiting.  Neurological: Positive for dizziness. Negative for syncope, weakness, numbness and headaches.   All other systems reviewed and are negative.   Allergies  Onion; Other; and Peanut-containing drug products  Home Medications   Prior to Admission medications   Medication Sig Start Date End Date Taking? Authorizing Provider  Phenyleph-Doxylamine-DM-APAP (NYQUIL SEVERE COLD/FLU) 5-6.25-10-325 MG/15ML LIQD Take 30 mLs by mouth at bedtime as needed (cough).   Yes Historical Provider, MD  cyclobenzaprine (FLEXERIL) 10 MG tablet Take 1 tablet (10 mg total) by mouth 2 (two) times daily as needed for muscle spasms. Patient not taking: Reported on 04/07/2015 05/02/14   Glendell Docker, NP  HYDROcodone-acetaminophen (NORCO/VICODIN) 5-325 MG per tablet Take 1 tablet by mouth every 6 (six) hours as needed. Patient not taking: Reported on 04/07/2015 06/03/14   Montine Circle, PA-C  ibuprofen (ADVIL,MOTRIN) 600 MG tablet Take 1 tablet (600 mg total) by mouth every 6 (six) hours as needed. Patient not taking: Reported on 04/07/2015 06/03/14   Montine Circle, PA-C  meclizine (ANTIVERT) 25 MG tablet Take 1 tablet (25 mg total) by mouth 3 (three) times daily as needed for dizziness. 04/07/15   Antonietta Breach, PA-C  tobramycin (TOBREX) 0.3 % ophthalmic solution Place 1 drop into both eyes every 4 (four) hours. Patient not taking: Reported on 05/02/2014 09/26/12   Harden Mo, MD   BP 131/78 mmHg  Pulse 65  Temp(Src) 97.7 F (36.5 C) (Oral)  Resp 18  Ht '5\' 7"'$  (1.702 m)  Wt 101.606 kg  BMI 35.08 kg/m2  SpO2 100%   Physical Exam  Constitutional: He is oriented to person, place, and time. He appears well-developed and well-nourished. No  distress.  Nontoxic/nonseptic appearing  HENT:  Head: Normocephalic and atraumatic.  Right Ear: Tympanic membrane, external ear and ear canal normal.  Left Ear: Tympanic membrane, external ear and ear canal normal.  Mouth/Throat: Oropharynx is clear and moist. No oropharyngeal exudate.  Audible nasal congestion  Eyes: Conjunctivae and EOM are normal. Pupils are equal,  round, and reactive to light. No scleral icterus.  Neck: Normal range of motion.  No nuchal rigidity or meningismus  Cardiovascular: Normal rate, regular rhythm and intact distal pulses.   Pulmonary/Chest: Effort normal and breath sounds normal. No respiratory distress. He has no wheezes. He has no rales.  Respirations even and unlabored  Musculoskeletal: Normal range of motion.  Neurological: He is alert and oriented to person, place, and time. No cranial nerve deficit. He exhibits normal muscle tone. Coordination normal.  GCS 15. Speech is goal oriented. No cranial nerve deficits appreciated; speech eyebrow raise, no facial drooping, tongue midline. Patient has equal grip strength bilaterally with 5/5 strength against resistance in all major muscle groups bilaterally. Sensation to light touch intact and patient was extremity is without ataxia; normal finger-nose-finger. Patient ambulatory steady gait.  Skin: Skin is warm and dry. No rash noted. He is not diaphoretic. No erythema. No pallor.  Psychiatric: He has a normal mood and affect. His behavior is normal.  Nursing note and vitals reviewed.   ED Course  Procedures (including critical care time) Labs Review Labs Reviewed - No data to display  Imaging Review No results found.   I have personally reviewed and evaluated these images and lab results as part of my medical decision-making.   EKG Interpretation None      MDM   Final diagnoses:  BPPV (benign paroxysmal positional vertigo), right    29 year old male with no significant past medical history presents to the emergency department for evaluation of vertigo which began at 1800 this evening. Patient reports that symptoms were preceded by an upper respiratory infection and congestion. No history of head injury or trauma. Neurologic exam today is nonfocal. Symptoms improved after patient given Reglan and Antivert. Symptoms consistent with benign vertigo. No current concern for  central cause. Will continue management on an outpatient basis with Antivert. Return precautions discussed and provided. Patient discharged in good condition with no unaddressed concerns.   Filed Vitals:   04/06/15 2338 04/07/15 0115 04/07/15 0348  BP: 149/92 139/77 131/78  Pulse: 74 69 65  Temp: 97.5 F (36.4 C)  97.7 F (36.5 C)  TempSrc: Oral  Oral  Resp: '16 17 18  '$ Height: '5\' 7"'$  (1.702 m)    Weight: 101.606 kg    SpO2: 100% 100% 100%     Antonietta Breach, PA-C 04/07/15 [place], MD 04/07/15 [phone]

## 2015-04-07 NOTE — Discharge Instructions (Signed)
Benign Positional Vertigo Vertigo is the feeling that you or your surroundings are moving when they are not. Benign positional vertigo is the most common form of vertigo. The cause of this condition is not serious (is benign). This condition is triggered by certain movements and positions (is positional). This condition can be dangerous if it occurs while you are doing something that could endanger you or others, such as driving.  CAUSES In many cases, the cause of this condition is not known. It may be caused by a disturbance in an area of the inner ear that helps your brain to sense movement and balance. This disturbance can be caused by a viral infection (labyrinthitis), head injury, or repetitive motion. RISK FACTORS This condition is more likely to develop in:  Women.  People who are 31 years of age or older. SYMPTOMS Symptoms of this condition usually happen when you move your head or your eyes in different directions. Symptoms may start suddenly, and they usually last for less than a minute. Symptoms may include:  Loss of balance and falling.  Feeling like you are spinning or moving.  Feeling like your surroundings are spinning or moving.  Nausea and vomiting.  Blurred vision.  Dizziness.  Involuntary eye movement (nystagmus). Symptoms can be mild and cause only slight annoyance, or they can be severe and interfere with daily life. Episodes of benign positional vertigo may return (recur) over time, and they may be triggered by certain movements. Symptoms may improve over time. DIAGNOSIS This condition is usually diagnosed by medical history and a physical exam of the head, neck, and ears. You may be referred to a health care provider who specializes in ear, nose, and throat (ENT) problems (otolaryngologist) or a provider who specializes in disorders of the nervous system (neurologist). You may have additional testing, including:  MRI.  A CT scan.  Eye movement tests. Your  health care provider may ask you to change positions quickly while he or she watches you for symptoms of benign positional vertigo, such as nystagmus. Eye movement may be tested with an electronystagmogram (ENG), caloric stimulation, the Dix-Hallpike test, or the roll test.  An electroencephalogram (EEG). This records electrical activity in your brain.  Hearing tests. TREATMENT Usually, your health care provider will treat this by moving your head in specific positions to adjust your inner ear back to normal. Surgery may be needed in severe cases, but this is rare. In some cases, benign positional vertigo may resolve on its own in 2-4 weeks. HOME CARE INSTRUCTIONS Safety  Move slowly.Avoid sudden body or head movements.  Avoid driving.  Avoid operating heavy machinery.  Avoid doing any tasks that would be dangerous to you or others if a vertigo episode would occur.  If you have trouble walking or keeping your balance, try using a cane for stability. If you feel dizzy or unstable, sit down right away.  Return to your normal activities as told by your health care provider. Ask your health care provider what activities are safe for you. General Instructions  Take over-the-counter and prescription medicines only as told by your health care provider.  Avoid certain positions or movements as told by your health care provider.  Drink enough fluid to keep your urine clear or pale yellow.  Keep all follow-up visits as told by your health care provider. This is important. SEEK MEDICAL CARE IF:  You have a fever.  Your condition gets worse or you develop new symptoms.  Your family or friends   notice any behavioral changes.  Your nausea or vomiting gets worse.  You have numbness or a "pins and needles" sensation. SEEK IMMEDIATE MEDICAL CARE IF:  You have difficulty speaking or moving.  You are always dizzy.  You faint.  You develop severe headaches.  You have weakness in your  legs or arms.  You have changes in your hearing or vision.  You develop a stiff neck.  You develop sensitivity to light.   This information is not intended to replace advice given to you by your health care provider. Make sure you discuss any questions you have with your health care provider.   Document Released: 10/20/2005 Document Revised: 10/03/2014 Document Reviewed: 05/07/2014 Elsevier Interactive Patient Education 2016 [employer]  

## 2015-04-16 ENCOUNTER — Emergency Department (HOSPITAL_COMMUNITY): Admission: EM | Admit: 2015-04-16 | Discharge: 2015-04-16 | Discharge status: Absent without leave | Payer: Self-pay

## 2015-04-16 NOTE — ED Notes (Signed)
Pt called x 3 w/o response for triage.

## 2015-04-16 NOTE — ED Notes (Signed)
Pt called for triage and vs with no response.

## 2015-04-17 ENCOUNTER — Emergency Department (HOSPITAL_COMMUNITY)
Admission: EM | Admit: 2015-04-17 | Discharge: 2015-04-17 | Disposition: A | Discharge status: Home / Self Care | Payer: Self-pay | Attending: Emergency Medicine | Admitting: Emergency Medicine

## 2015-04-17 ENCOUNTER — Emergency Department (HOSPITAL_COMMUNITY): Payer: Self-pay

## 2015-04-17 ENCOUNTER — Encounter (HOSPITAL_COMMUNITY): Payer: Self-pay | Admitting: Emergency Medicine

## 2015-04-17 DIAGNOSIS — S4992XA Unspecified injury of left shoulder and upper arm, initial encounter: Secondary | ICD-10-CM | POA: Insufficient documentation

## 2015-04-17 DIAGNOSIS — I10 Essential (primary) hypertension: Secondary | ICD-10-CM | POA: Insufficient documentation

## 2015-04-17 DIAGNOSIS — Y9289 Other specified places as the place of occurrence of the external cause: Secondary | ICD-10-CM | POA: Insufficient documentation

## 2015-04-17 DIAGNOSIS — W500XXA Accidental hit or strike by another person, initial encounter: Secondary | ICD-10-CM | POA: Insufficient documentation

## 2015-04-17 DIAGNOSIS — Y998 Other external cause status: Secondary | ICD-10-CM | POA: Insufficient documentation

## 2015-04-17 DIAGNOSIS — Y9367 Activity, basketball: Secondary | ICD-10-CM | POA: Insufficient documentation

## 2015-04-17 IMAGING — CR DG SHOULDER 2+V*L*
3 series · 3 of 3 positions shown · non-contrast
Comparison: None.

CLINICAL DATA: Left shoulder injury playing basketball yesterday.
Left shoulder pain. Initial encounter.

EXAM:
LEFT SHOULDER - 2+ VIEW

[w shoulder external left]
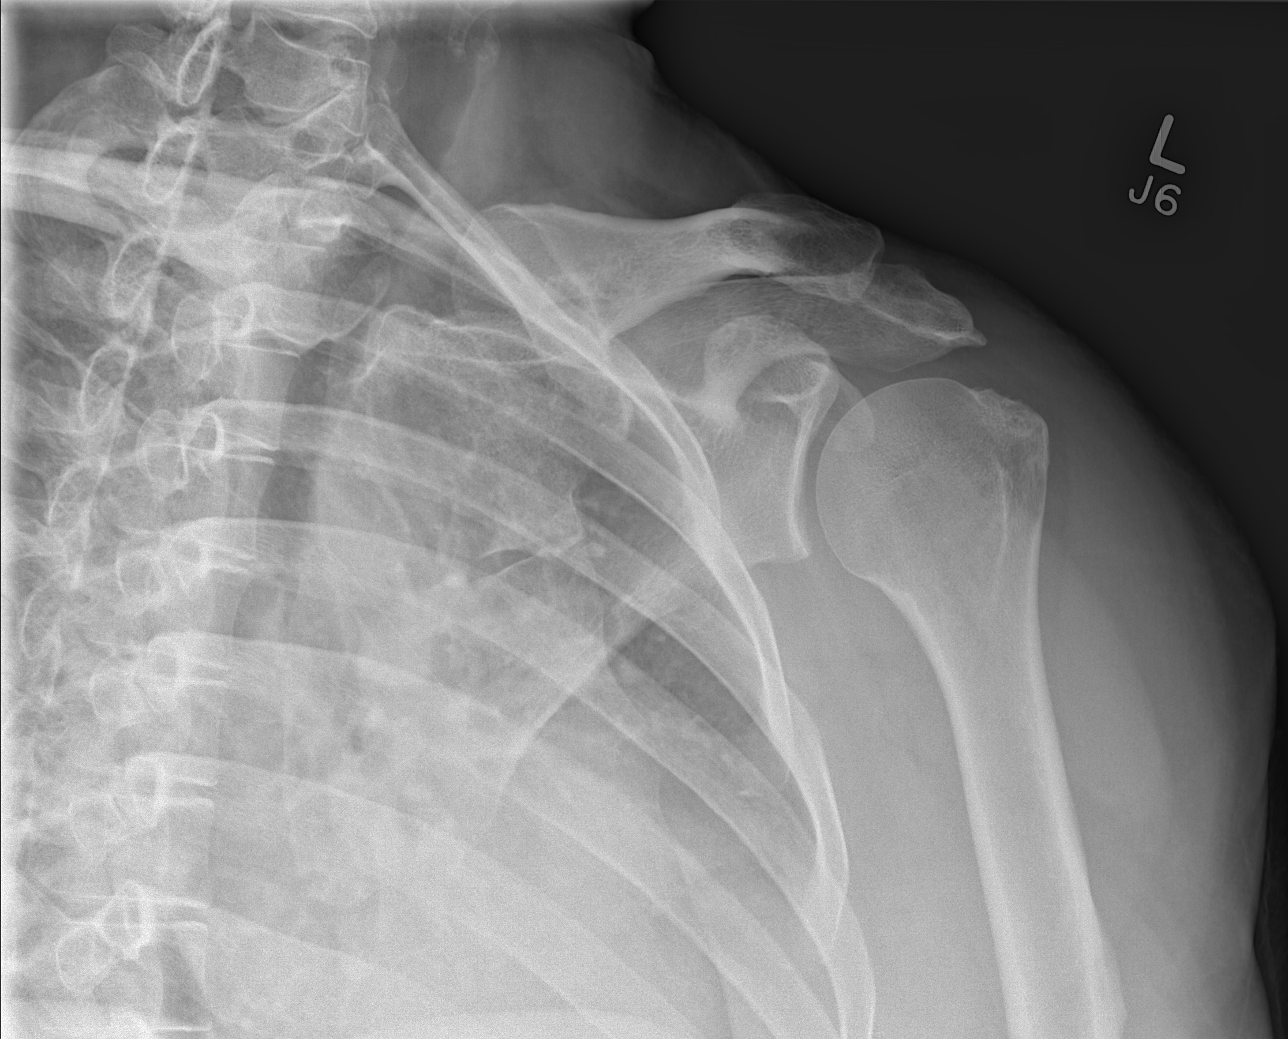

[w shoulder y-view left]
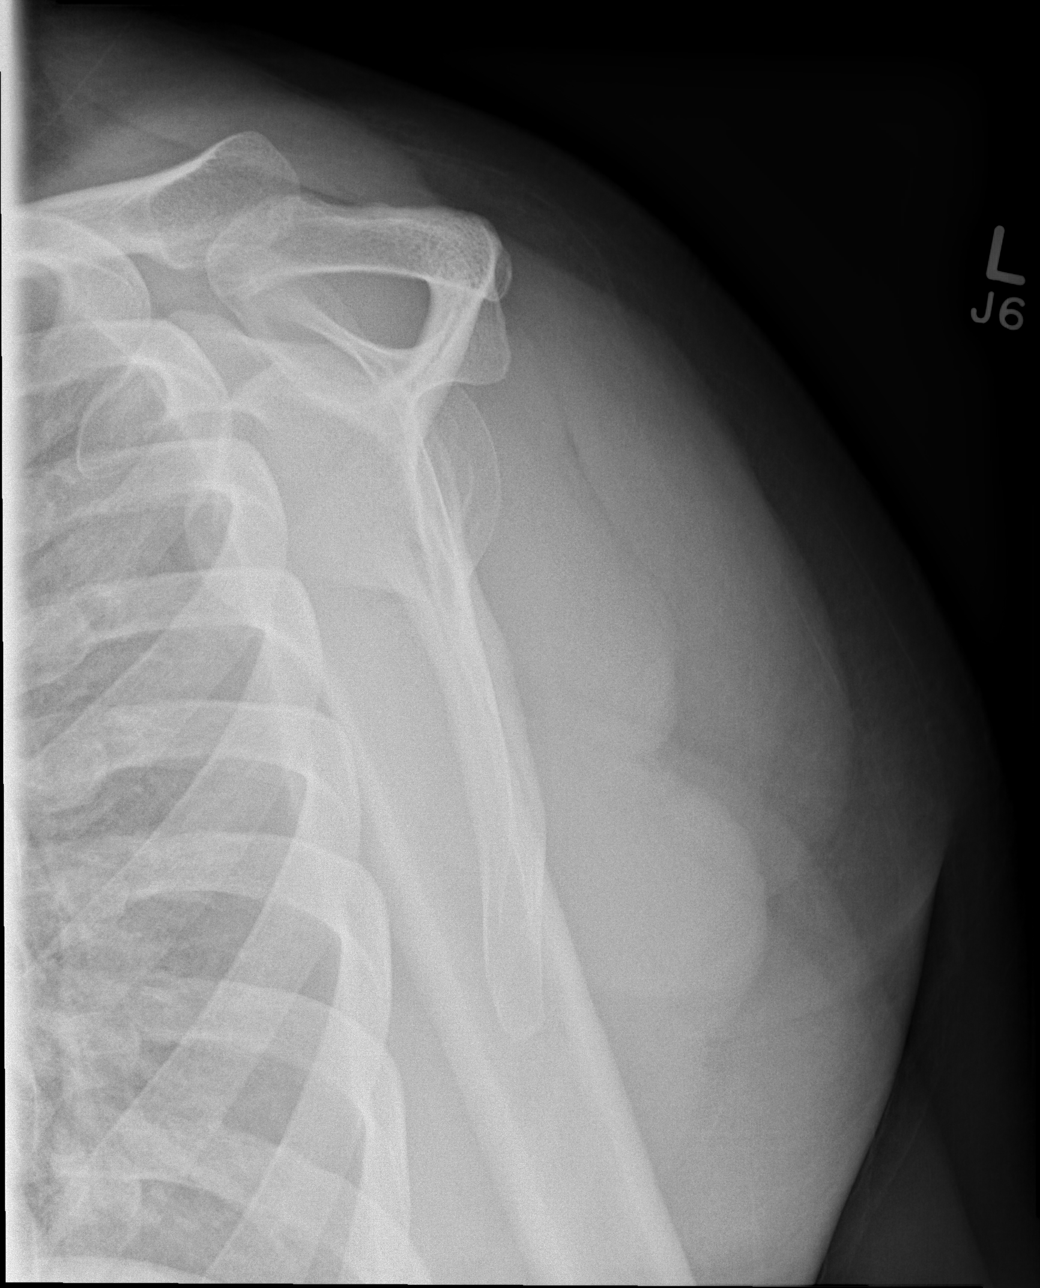

[x shoulder ap left]
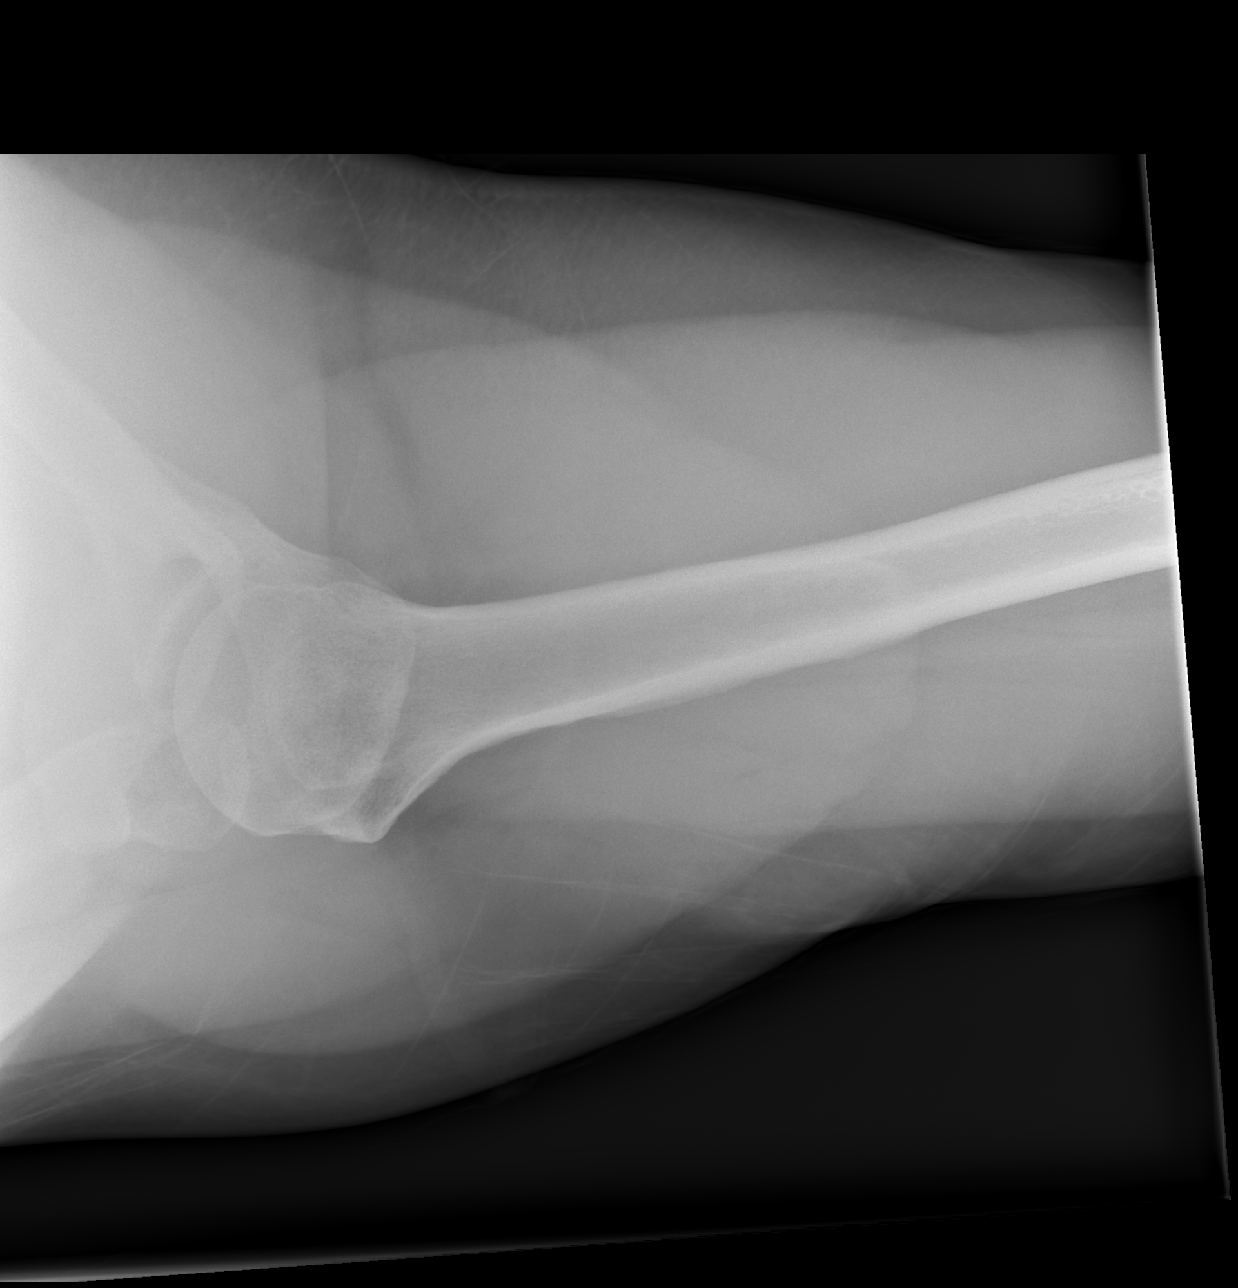

[3 of 3 positions shown; findings below may reference images not displayed]

FINDINGS: There is no evidence of fracture or dislocation. Mild degenerative
spurring is seen involving the distal acromion process.
IMPRESSION: No acute findings. Mild degenerative spurring of the distal
acromion.

## 2015-04-17 MED ORDER — CYCLOBENZAPRINE HCL 10 MG PO TABS: 10.0000 mg | ORAL_TABLET | Freq: Two times a day (BID) | ORAL | Status: DC | PRN

## 2015-04-17 MED ORDER — IBUPROFEN 600 MG PO TABS: 600.0000 mg | ORAL_TABLET | Freq: Four times a day (QID) | ORAL | Status: DC | PRN

## 2015-04-17 NOTE — ED Notes (Signed)
Per pt, states he was playing basket ball when he injured left shoulder pain-thinks he tore rotator cuff

## 2015-04-17 NOTE — Discharge Instructions (Signed)
RICE for Routine Care of Injuries Theroutine careofmanyinjuriesincludes rest, ice, compression, and elevation (RICE therapy). RICE therapy is often recommended for injuries to soft tissues, such as a muscle strain, ligament injuries, bruises, and overuse injuries. It can also be used for some bony injuries. Using RICE therapy can help to relieve pain, lessen swelling, and enable your body to heal. Rest Rest is required to allow your body to heal. This usually involves reducing your normal activities and avoiding use of the injured part of your body. Generally, you can return to your normal activities when you are comfortable and have been given permission by your health care provider. Ice Icing your injury helps to keep the swelling down, and it lessens pain. Do not apply ice directly to your skin.  Put ice in a plastic bag.  Place a towel between your skin and the bag.  Leave the ice on for 20 minutes, 2-3 times a day. Do this for as long as you are directed by your health care provider. Compression Compression means putting pressure on the injured area. Compression helps to keep swelling down, gives support, and helps with discomfort. Compression may be done with an elastic bandage. If an elastic bandage has been applied, follow these general tips:  Remove and reapply the bandage every 3-4 hours or as directed by your health care provider.  Make sure the bandage is not wrapped too tightly, because this can cut off circulation. If part of your body beyond the bandage becomes blue, numb, cold, swollen, or more painful, your bandage is most likely too tight. If this occurs, remove your bandage and reapply it more loosely.  See your health care provider if the bandage seems to be making your problems worse rather than better. Elevation Elevation means keeping the injured area raised. This helps to lessen swelling and decrease pain. If possible, your injured area should be elevated at or  above the level of your heart or the center of your chest. [place]? You should seek medical care if:  Your pain and swelling continue.  Your symptoms are getting worse rather than improving. These symptoms may indicate that further evaluation or further X-rays are needed. Sometimes, X-rays may not show a small broken bone (fracture) until a number of days later. Make a follow-up appointment with your health care provider. WHEN SHOULD I SEEK IMMEDIATE MEDICAL CARE? You should seek immediate medical care if:  You have sudden severe pain at or below the area of your injury.  You have redness or increased swelling around your injury.  You have tingling or numbness at or below the area of your injury that does not improve after you remove the elastic bandage.   This information is not intended to replace advice given to you by your health care provider. Make sure you discuss any questions you have with your health care provider.   Document Released: 04/26/2000 Document Revised: 10/03/2014 Document Reviewed: 12/20/2013 Elsevier Interactive Patient Education [employer].

## 2015-04-17 NOTE — ED Provider Notes (Signed)
CSN: [id number]     Arrival date & time 04/17/15  1116 History  By signing my name below, I, Rayna Sexton, attest that this documentation has been prepared under the direction and in the presence of Domenic Moras, PA-C. Electronically Signed: Rayna Sexton, ED Scribe. 04/17/2015. 2:22 PM.   Chief Complaint  Patient presents with  . Shoulder Pain   The history is provided by the patient. No language interpreter was used.    HPI Comments: William Ali is a 30 y.o. male who presents to the Emergency Department complaining of constant, moderate, left shoulder pain that worsens with movement onset 1 day ago. Pt was playing basketball and accidentally hyperextended his LUE on another player, heard a "crack" and felt a sudden onset of his pain which has not subsided. His pain is 10/10 with movement and 4/10 at rest. He has taken naproxen and muscle relaxers without significant relief of his pain. He denies numbness, tingling, chills, fevers or any other associated symptoms at this time.   Past Medical History  Diagnosis Date  . Hypertension   . Back pain    Past Surgical History  Procedure Laterality Date  . Knee surgery     Family History  Problem Relation Age of Onset  . Hypertension Mother   . Diabetes Father    Social History  Substance Use Topics  . Smoking status: Never Smoker   . Smokeless tobacco: None  . Alcohol Use: No    Review of Systems  Constitutional: Negative for fever and chills.  Musculoskeletal: Positive for arthralgias.  Skin: Negative for wound.  Neurological: Negative for numbness.   Allergies  Onion; Other; and Peanut-containing drug products  Home Medications   Prior to Admission medications   Medication Sig Start Date End Date Taking? Authorizing Provider  cyclobenzaprine (FLEXERIL) 10 MG tablet Take 1 tablet (10 mg total) by mouth 2 (two) times daily as needed for muscle spasms. Patient not taking: Reported on 04/07/2015 05/02/14   Glendell Docker,  NP  HYDROcodone-acetaminophen (NORCO/VICODIN) 5-325 MG per tablet Take 1 tablet by mouth every 6 (six) hours as needed. Patient not taking: Reported on 04/07/2015 06/03/14   Montine Circle, PA-C  ibuprofen (ADVIL,MOTRIN) 600 MG tablet Take 1 tablet (600 mg total) by mouth every 6 (six) hours as needed. Patient not taking: Reported on 04/07/2015 06/03/14   Montine Circle, PA-C  meclizine (ANTIVERT) 25 MG tablet Take 1 tablet (25 mg total) by mouth 3 (three) times daily as needed for dizziness. 04/07/15   Antonietta Breach, PA-C  Phenyleph-Doxylamine-DM-APAP (NYQUIL SEVERE COLD/FLU) 5-6.25-10-325 MG/15ML LIQD Take 30 mLs by mouth at bedtime as needed (cough).    Historical Provider, MD  tobramycin (TOBREX) 0.3 % ophthalmic solution Place 1 drop into both eyes every 4 (four) hours. Patient not taking: Reported on 05/02/2014 09/26/12   Harden Mo, MD   BP 159/93 mmHg  Pulse 81  Temp(Src) 97.3 F (36.3 C) (Oral)  Resp 16  SpO2 100% Physical Exam  Constitutional: He is oriented to person, place, and time. He appears well-developed and well-nourished.  HENT:  Head: Normocephalic and atraumatic.  Eyes: EOM are normal.  Neck: Normal range of motion. Neck supple.  Cardiovascular: Normal rate.   Pulses:      Radial pulses are 2+ on the right side, and 2+ on the left side.  Pulmonary/Chest: Effort normal. No respiratory distress.  Abdominal: Soft.  Musculoskeletal: He exhibits tenderness. He exhibits no edema.  Left extremity: FROM of left hand, wrist  and elbow; DROM of left shoulder secondary to pain; tenderness noted to the left glenohumeral region without swelling, bruising or deformities FROM of RUE  Neurological: He is alert and oriented to person, place, and time.  Skin: Skin is warm and dry.  Psychiatric: He has a normal mood and affect.  Nursing note and vitals reviewed.  ED Course  Procedures  DIAGNOSTIC STUDIES: Oxygen Saturation is 100% on RA, normal by my interpretation.    COORDINATION  OF CARE: 2:17 PM Pt presents today due to left shoulder pain. Discussed imaging results and treatment plan with pt at bedside including a left arm sling, rx for flexeril and ibuprofen and an orthopaedic referral. Return precautions noted. Pt agreed to plan.  Labs Review Labs Reviewed - No data to display  Imaging Review Dg Shoulder Left  04/17/2015  CLINICAL DATA:  Left shoulder injury playing basketball yesterday. Left shoulder pain. Initial encounter. EXAM: LEFT SHOULDER - 2+ VIEW COMPARISON:  None. FINDINGS: There is no evidence of fracture or dislocation. Mild degenerative spurring is seen involving the distal acromion process. IMPRESSION: No acute findings. Mild degenerative spurring of the distal acromion. Electronically Signed   By: Earle Gell M.D.   On: 04/17/2015 11:47   I have personally reviewed and evaluated these images as part of my medical decision-making.   EKG Interpretation None      MDM   Final diagnoses:  Shoulder injury, left, initial encounter    BP 159/93 mmHg  Pulse 81  Temp(Src) 97.3 F (36.3 C) (Oral)  Resp 16  SpO2 100%  I personally performed the services described in this documentation, which was scribed in my presence. The recorded information has been reviewed and is accurate.      Domenic Moras, PA-C 04/17/15 [place], MD 04/17/15 [phone]

## 2018-01-12 ENCOUNTER — Emergency Department (HOSPITAL_COMMUNITY): Payer: Self-pay

## 2018-01-12 ENCOUNTER — Emergency Department (HOSPITAL_COMMUNITY)
Admission: EM | Admit: 2018-01-12 | Discharge: 2018-01-12 | Disposition: A | Discharge status: Home / Self Care | Payer: Self-pay | Attending: Emergency Medicine | Admitting: Emergency Medicine

## 2018-01-12 ENCOUNTER — Encounter (HOSPITAL_COMMUNITY): Payer: Self-pay | Admitting: Emergency Medicine

## 2018-01-12 DIAGNOSIS — S8251XA Displaced fracture of medial malleolus of right tibia, initial encounter for closed fracture: Secondary | ICD-10-CM | POA: Insufficient documentation

## 2018-01-12 DIAGNOSIS — I10 Essential (primary) hypertension: Secondary | ICD-10-CM | POA: Insufficient documentation

## 2018-01-12 DIAGNOSIS — Y929 Unspecified place or not applicable: Secondary | ICD-10-CM | POA: Insufficient documentation

## 2018-01-12 DIAGNOSIS — Y999 Unspecified external cause status: Secondary | ICD-10-CM | POA: Insufficient documentation

## 2018-01-12 DIAGNOSIS — Y939 Activity, unspecified: Secondary | ICD-10-CM | POA: Insufficient documentation

## 2018-01-12 DIAGNOSIS — Y33XXXA Other specified events, undetermined intent, initial encounter: Secondary | ICD-10-CM | POA: Insufficient documentation

## 2018-01-12 IMAGING — CR DG ANKLE COMPLETE 3+V*R*
3 series · 3 of 3 positions shown · non-contrast
Comparison: None in PACs.

CLINICAL DATA: Awakened week ago with medial right ankle pain and
swelling. No known injury.

EXAM:
RIGHT ANKLE - COMPLETE 3+ VIEW

[x ankle ap right]
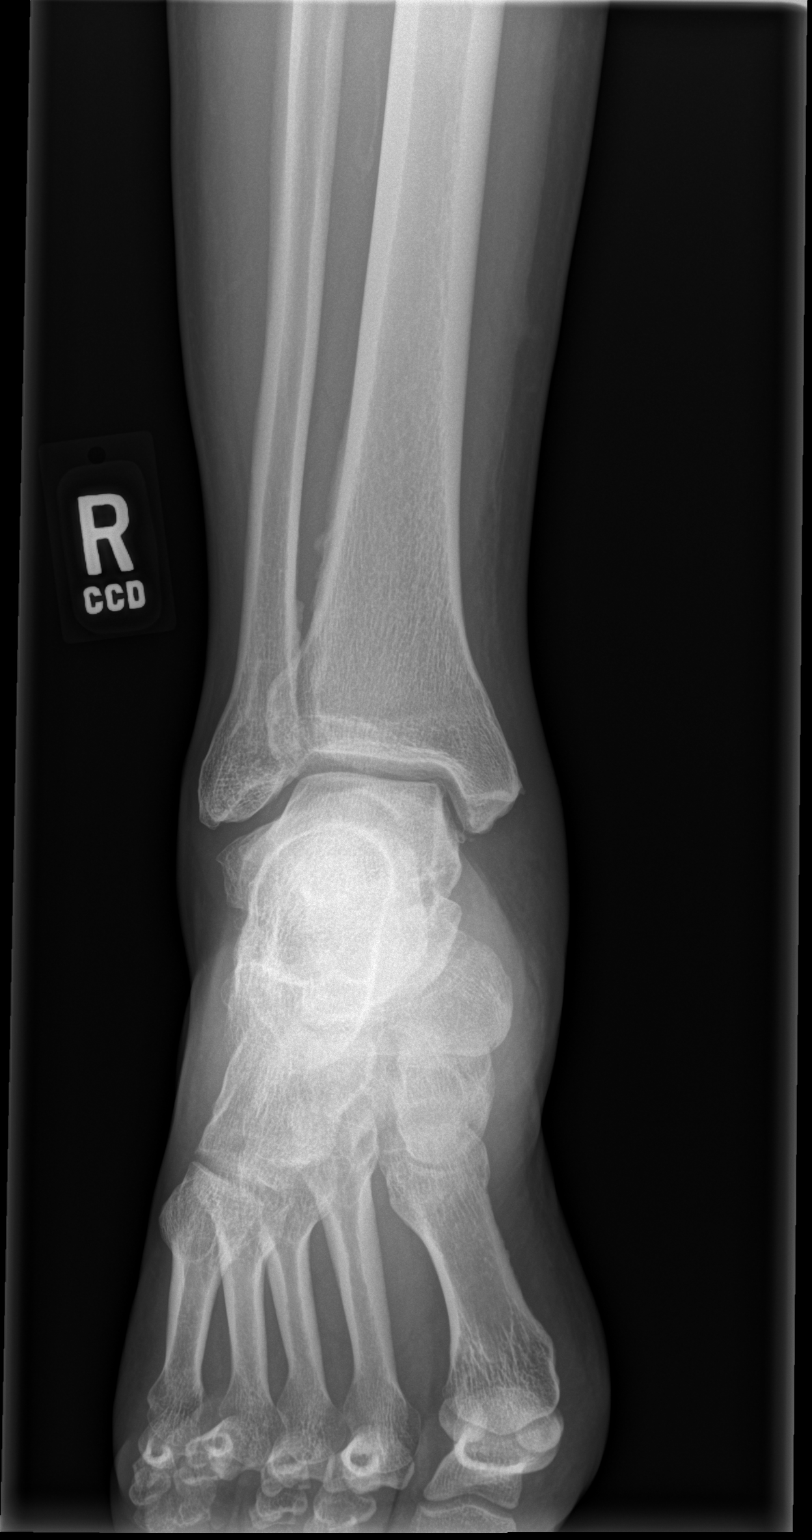

[x ankle obl right]
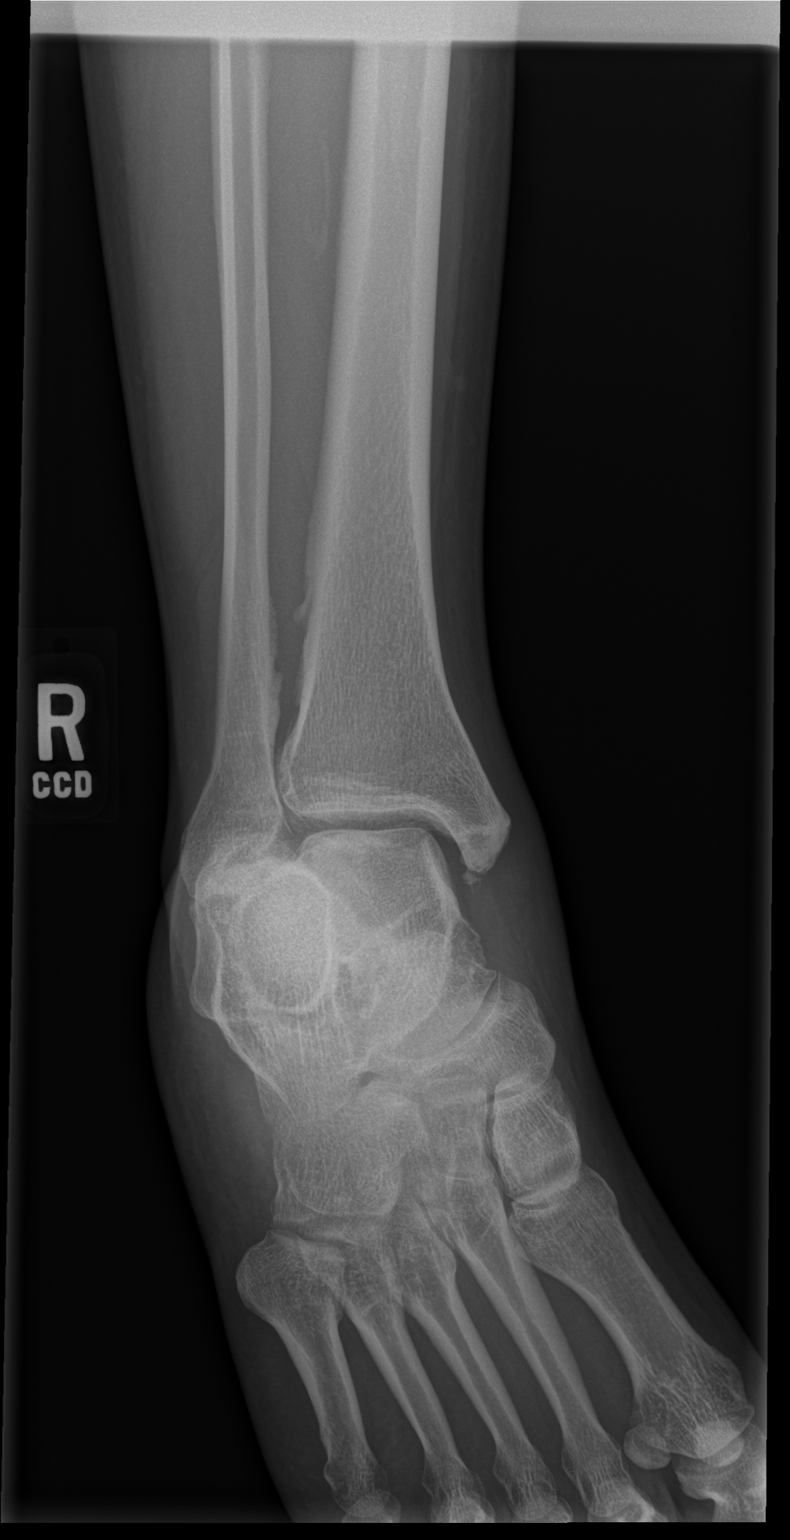

[x ankle lat right]
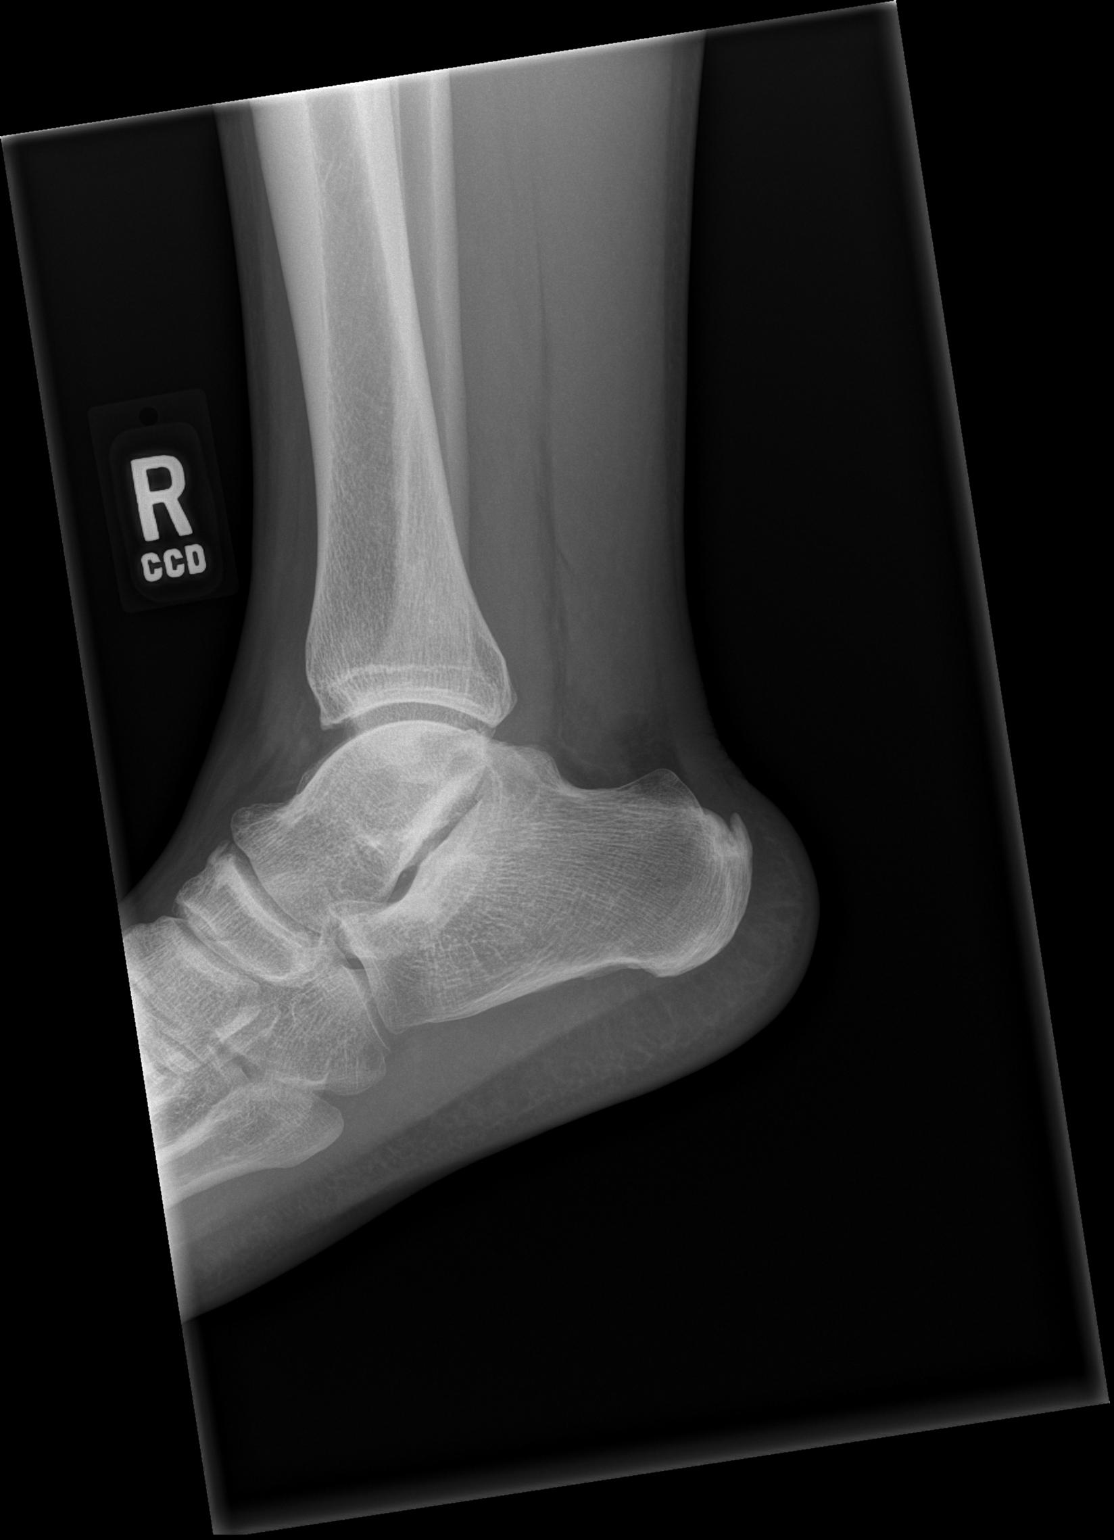

[3 of 3 positions shown; findings below may reference images not displayed]

FINDINGS: The bones are subjectively adequately mineralized. There is a tiny
bony density adjacent to the tip of the medial malleolus that may
reflect an avulsion fracture or less likely an accessory ossicle.
The ankle joint mortise is preserved. The talar dome is intact.
There is an Achilles region calcaneal spur. There is soft tissue
swelling anteriorly and medially.
IMPRESSION: Probable avulsion fracture from the inferior tip of the medial
malleolus. There is overlying soft tissue swelling. No disruption of
the ankle joint mortise.

## 2018-01-12 MED ORDER — OXYCODONE-ACETAMINOPHEN 5-325 MG PO TABS
1.0000 | ORAL_TABLET | Freq: Once | ORAL | Status: AC
  Administered 2018-01-12: 1 via ORAL
  Filled 2018-01-12: qty 1

## 2018-01-12 MED ORDER — OXYCODONE-ACETAMINOPHEN 5-325 MG PO TABS: 1.0000 | ORAL_TABLET | Freq: Three times a day (TID) | ORAL | 0 refills | Status: DC | PRN

## 2018-01-12 NOTE — ED Provider Notes (Signed)
[facility] DEPT Provider Note   CSN: [id number] Arrival date & time: 01/12/18  1037     History   Chief Complaint Chief Complaint  Patient presents with  . Foot Pain    HPI William Ali is a 32 y.o. male with a history of back pain and hypertension who presents to the emergency department who presents to the emergency department with a chief complaint of right ankle pain.  The patient endorses constant right ankle pain that began 8 days ago(12/10).  States he went to bed pain-free and had significant pain with the first step of the day.  He reports that the non-radiating pain was severe and characterized as stabbing.  States he was barely able to walk and was limping.  He reports some improvement in the pain on 11/12 after taking Motrin and elevating the pain at night and was able to start walking with his usual gait again.  He reports that the pain returned on 12/14 and significantly worsened on 12/16, which is when he developed swelling.  He is continue to elevate his leg and take Advil with no improvement.  States he can barely walk or put weight on the foot due to the pain.  He states that his foot is so painful that it hurts when even his sock touches the area.  He states that the pain in the joint feels "like it's on fire".  He reports associated numbness and paresthesias in the right heel and right toes.  He denies paresthesias or numbness in the left lower extremity. He denies fevers, chills, falls, pain or warmth to the right ankle, new exercises, injuries, pain in other joints, or left knee pain.  No history of previous monocular joint pain.  He denies alcohol use.  States that he eats quite a bit of red meat, pork, and chicken, but minimal cured meats.  No family history of gout.  No history of right ankle surgery or injury.  The history is provided by the patient. No language interpreter was used.    Past Medical History:  Diagnosis Date  .  Back pain   . Hypertension     There are no active problems to display for this patient.   Past Surgical History:  Procedure Laterality Date  . KNEE SURGERY          Home Medications    Prior to Admission medications   Medication Sig Start Date End Date Taking? Authorizing Provider  cyclobenzaprine (FLEXERIL) 10 MG tablet Take 1 tablet (10 mg total) by mouth 2 (two) times daily as needed for muscle spasms. 04/17/15   Domenic Moras, PA-C  ibuprofen (ADVIL,MOTRIN) 600 MG tablet Take 1 tablet (600 mg total) by mouth every 6 (six) hours as needed. 04/17/15   Domenic Moras, PA-C  meclizine (ANTIVERT) 25 MG tablet Take 1 tablet (25 mg total) by mouth 3 (three) times daily as needed for dizziness. 04/07/15   Antonietta Breach, PA-C  oxyCODONE-acetaminophen (PERCOCET/ROXICET) 5-325 MG tablet Take 1 tablet by mouth every 8 (eight) hours as needed for severe pain. 01/12/18   Natarsha Hurwitz A, PA-C  Phenyleph-Doxylamine-DM-APAP (NYQUIL SEVERE COLD/FLU) 5-6.25-10-325 MG/15ML LIQD Take 30 mLs by mouth at bedtime as needed (cough).    [provider]    Family History Family History  Problem Relation Age of Onset  . Hypertension Mother   . Diabetes Father     Social History Social History   Tobacco Use  . Smoking status: Never Smoker  .  Smokeless tobacco: Never Used  Substance Use Topics  . Alcohol use: No  . Drug use: No     Allergies   Onion; Other; and Peanut-containing drug products   Review of Systems Review of Systems  Constitutional: Negative for activity change, chills and fever.  Respiratory: Negative for shortness of breath.   Cardiovascular: Negative for chest pain.  Gastrointestinal: Negative for abdominal pain, nausea and vomiting.  Musculoskeletal: Positive for arthralgias, gait problem, joint swelling and myalgias. Negative for back pain.  Skin: Negative for color change, rash and wound.  Neurological: Positive for numbness. Negative for weakness.      Physical Exam Updated Vital Signs BP (!) 157/79 (BP Location: Right Arm)   Pulse 93   Temp 97.9 F (36.6 C) (Oral)   Resp 16   SpO2 100%   Physical Exam Vitals signs and nursing note reviewed.  Constitutional:      Appearance: He is well-developed.  HENT:     Head: Normocephalic.  Eyes:     Conjunctiva/sclera: Conjunctivae normal.  Neck:     Musculoskeletal: Neck supple.  Cardiovascular:     Rate and Rhythm: Normal rate and regular rhythm.     Heart sounds: No murmur.  Pulmonary:     Effort: Pulmonary effort is normal.  Abdominal:     General: There is no distension.     Palpations: Abdomen is soft.  Musculoskeletal:        General: Swelling and tenderness present. No deformity or signs of injury.     Comments: Exquisitely tender to palpation to the lateral malleolus of the right ankle with minimal palpation.  Range of motion of the right ankle is limited secondary to pain.  Lateral malleolus is edematous.  No swelling to the medial malleolus of the right ankle.  Good capillary refill of all digits of the right foot.  Sensation is intact and equal throughout.  5-5 strength against resistance with dorsiflexion plantarflexion of the toes.  Normal exam of the right knee.  Normal exam of the left lower extremity.  DP and PT pulses are 2+ and symmetric.  Skin:    General: Skin is warm and dry.  Neurological:     Mental Status: He is alert.  Psychiatric:        Behavior: Behavior normal.      ED Treatments / Results  Labs (all labs ordered are listed, but only abnormal results are displayed) Labs Reviewed - No data to display  EKG None  Radiology Dg Ankle Complete Right  Result Date: 01/12/2018 CLINICAL DATA:  Awakened week ago with medial right ankle pain and swelling. No known injury. EXAM: RIGHT ANKLE - COMPLETE 3+ VIEW COMPARISON:  None in PACs. FINDINGS: The bones are subjectively adequately mineralized. There is a tiny bony density adjacent to the tip of  the medial malleolus that may reflect an avulsion fracture or less likely an accessory ossicle. The ankle joint mortise is preserved. The talar dome is intact. There is an Achilles region calcaneal spur. There is soft tissue swelling anteriorly and medially. IMPRESSION: Probable avulsion fracture from the inferior tip of the medial malleolus. There is overlying soft tissue swelling. No disruption of the ankle joint mortise. Electronically Signed   By: David  Martinique M.D.   On: 01/12/2018 13:15    Procedures Procedures (including critical care time)  Medications Ordered in ED Medications  oxyCODONE-acetaminophen (PERCOCET/ROXICET) 5-325 MG per tablet 1 tablet (1 tablet Oral Given 01/12/18 1227)     Initial Impression /  Assessment and Plan / ED Course  I have reviewed the triage vital signs and the nursing notes.  Pertinent labs & imaging results that were available during my care of the patient were reviewed by me and considered in my medical decision making (see chart for details).     32 year old male with a history of back pain and hypertension presenting with atraumatic left ankle pain for 8 days.  No constitutional symptoms.  Low suspicion for gout or septic joint.  X-ray of the right ankle with probable avulsion fracture from the inferior tip of the medial malleolus with soft tissue swelling and without disruption of the ankle joint mortise.  This is consistent with the patient's pain.  Will place the patient in a cam walker with crutches.  Recommended RICE therapy and f/u with Orthopaedics. Pain controlled in the ED with Percocet. Will send home with similar. A 56-month prescription history query was performed using the [place] CSRS prior to discharge.  Strict return precautions given.  The patient is hemodynamically stable and in no acute distress.  He is safe for discharge to home with outpatient follow-up at this time.  Final Clinical Impressions(s) / ED Diagnoses   Final diagnoses:   Closed avulsion fracture of medial malleolus of right tibia, initial encounter    ED Discharge Orders         Ordered    oxyCODONE-acetaminophen (PERCOCET/ROXICET) 5-325 MG tablet  Every 8 hours PRN     01/12/18 1421           Landin Tallon A, PA-C 01/12/18 1427    Duffy Bruce, MD 01/13/18 [phone]

## 2018-01-12 NOTE — Discharge Instructions (Addendum)
Thank you for allowing me to care for you today in the Emergency Department.   Please call and schedule follow-up appointment with orthopedics in the next week.  You can put weight on your right foot as your pain allows.  Until then, use your crutches to keep weight off the foot.  When you are at home, elevate your right foot so that your toes are at or above the level of your nose.  Apply an ice pack for 15 to 20 minutes as frequently as needed to help with pain and swelling.  For mild to moderate pain, take 600 mg of ibuprofen with food or 650 mg of Tylenol.  For uncontrollable pain, take 1 tablet of Percocet every 8 hours as needed.  This prescription has been sent to the [facility] on [address].  Return to the emergency department if you have any fall or injury, if your toes turn blue, if you develop significantly worsening numbness or weakness that starts to go up the leg, if the right lower leg gets very swollen and painful, or if you develop other new, concerning symptoms.

## 2018-01-12 NOTE — ED Notes (Signed)
Bed: WTR5 Expected date:  Expected time:  Means of arrival:  Comments: 

## 2018-01-12 NOTE — ED Triage Notes (Signed)
Pt reports last Tuesday woke up and stepped and foot on right side was very painful.  Pt reports that was still sore couple days. Reports Sunday when woke up was swollen and very painful. Pt had several opinions from people and thinks might be gout.  Pain is worse with movement and weight bearing.

## 2018-01-12 NOTE — ED Notes (Signed)
Ortho tech called for crutches and cam walker

## 2018-01-12 NOTE — ED Notes (Signed)
Patient transported to X-ray 

## 2018-01-27 ENCOUNTER — Emergency Department (HOSPITAL_COMMUNITY): Payer: Self-pay

## 2018-01-27 ENCOUNTER — Emergency Department (HOSPITAL_COMMUNITY)
Admission: EM | Admit: 2018-01-27 | Discharge: 2018-01-27 | Disposition: A | Discharge status: Home / Self Care | Payer: Self-pay | Attending: Emergency Medicine | Admitting: Emergency Medicine

## 2018-01-27 DIAGNOSIS — S8251XK Displaced fracture of medial malleolus of right tibia, subsequent encounter for closed fracture with nonunion: Secondary | ICD-10-CM

## 2018-01-27 DIAGNOSIS — Y939 Activity, unspecified: Secondary | ICD-10-CM | POA: Insufficient documentation

## 2018-01-27 DIAGNOSIS — I10 Essential (primary) hypertension: Secondary | ICD-10-CM | POA: Insufficient documentation

## 2018-01-27 DIAGNOSIS — Y999 Unspecified external cause status: Secondary | ICD-10-CM | POA: Insufficient documentation

## 2018-01-27 DIAGNOSIS — Y929 Unspecified place or not applicable: Secondary | ICD-10-CM | POA: Insufficient documentation

## 2018-01-27 DIAGNOSIS — Z91010 Allergy to peanuts: Secondary | ICD-10-CM | POA: Insufficient documentation

## 2018-01-27 DIAGNOSIS — X509XXA Other and unspecified overexertion or strenuous movements or postures, initial encounter: Secondary | ICD-10-CM | POA: Insufficient documentation

## 2018-01-27 IMAGING — DX DG ANKLE COMPLETE 3+V*R*
3 series · 3 of 3 positions shown · non-contrast
Comparison: [DATE]

CLINICAL DATA: Pt states he was told about 2 weeks ago that he
broke his right ankle, but before that a different hospital told him
he had gout. Symptoms were getting better but they have gotten much
worse since yesterday. Medial aspect right ankle pain.

EXAM:
RIGHT ANKLE - COMPLETE 3+ VIEW

[ankle ap]
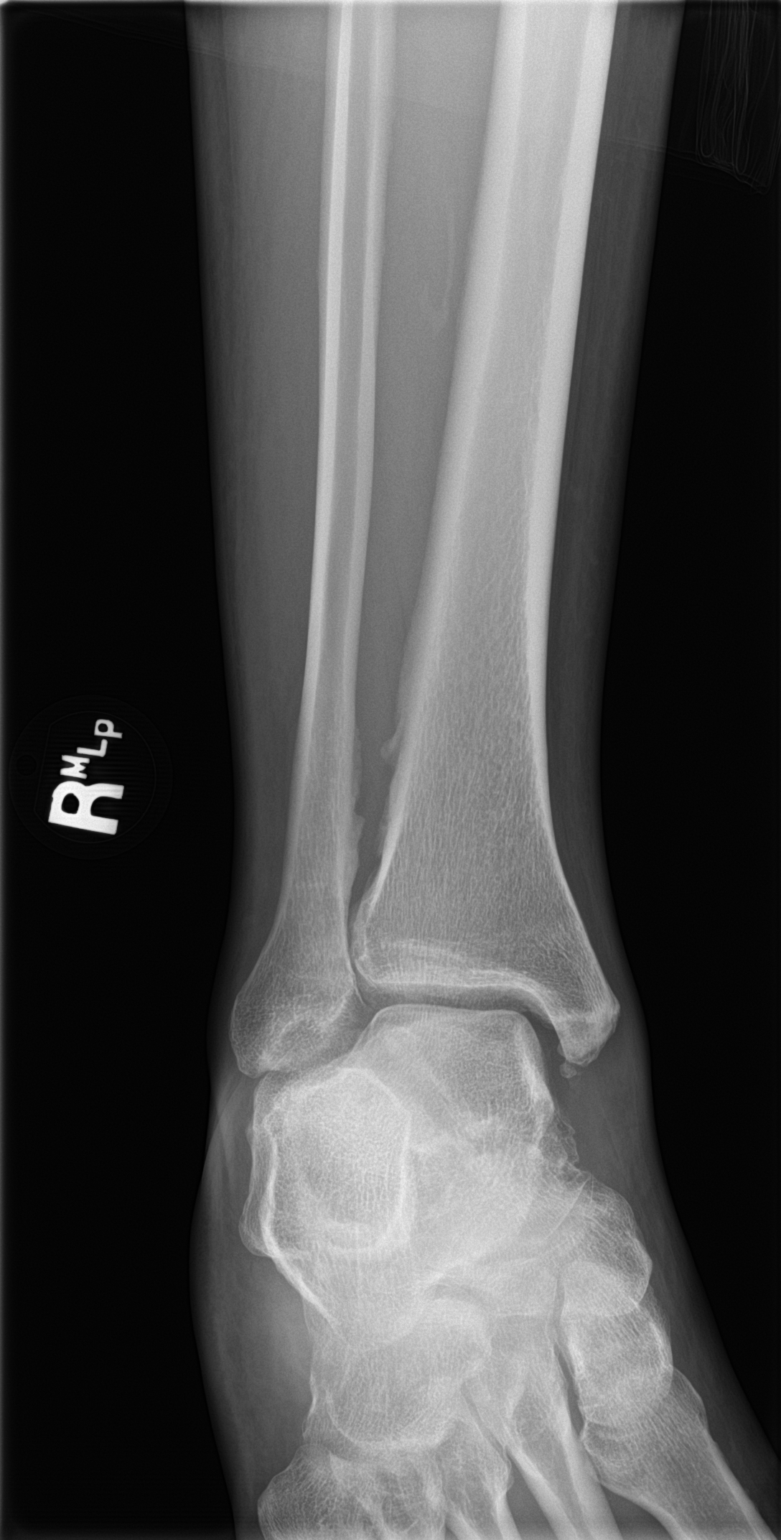

[ankle obl]
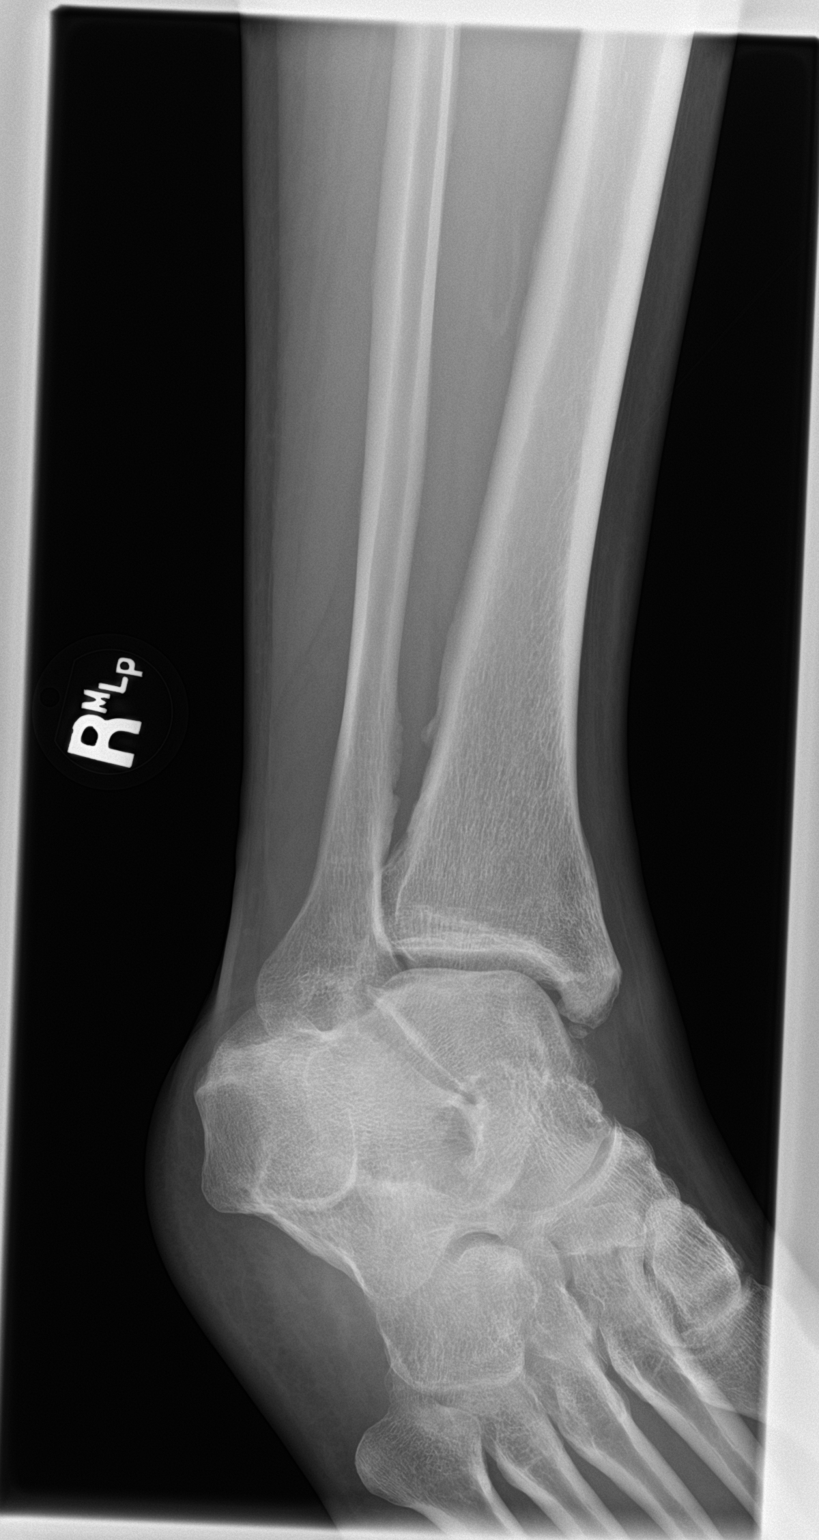

[ankle lat]
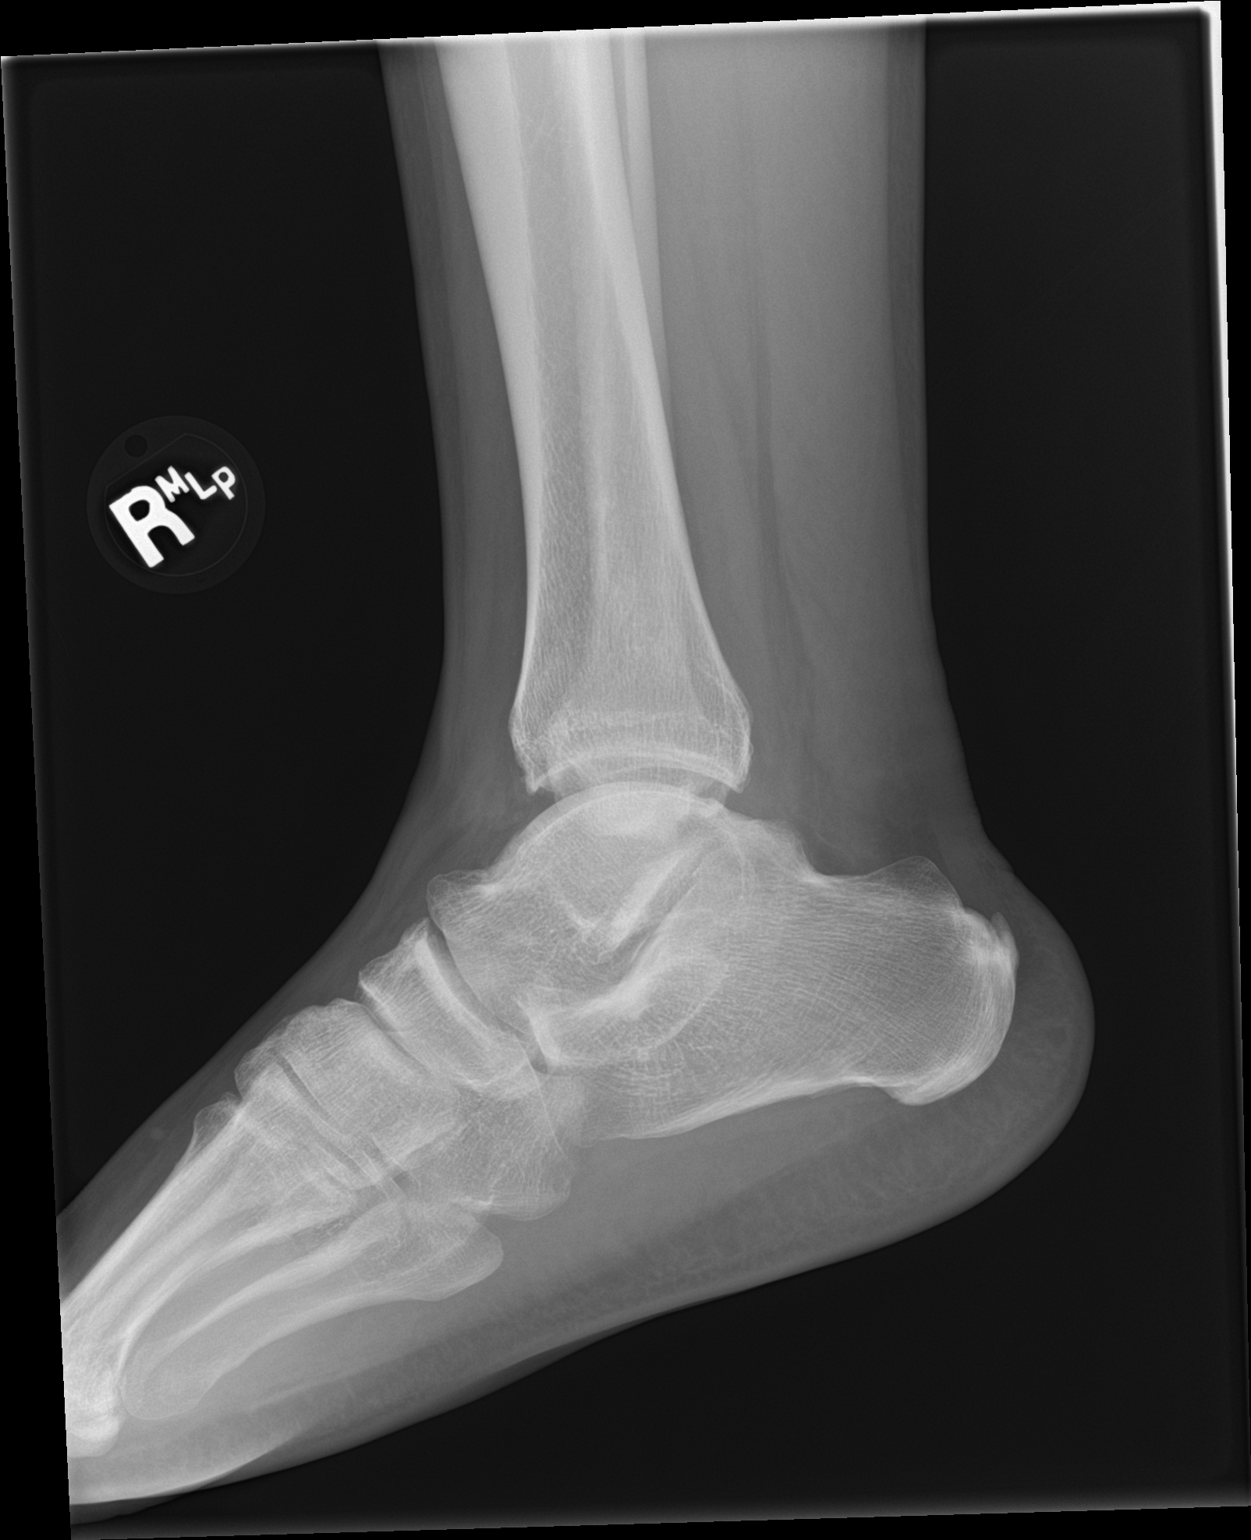

[3 of 3 positions shown; findings below may reference images not displayed]

FINDINGS: No acute fracture.  No bone lesion.

Well corticated ossified density lies along the inferior margin of
the medial malleolus consistent with an old avulsion fracture or an
accessory ossification center, unchanged from the prior study.

The ankle mortise is normally spaced and aligned.

Small dorsal calcaneal spur.

Soft tissues are unremarkable.
IMPRESSION: 1. No fracture or acute finding.
2. Previously described avulsion fracture from the medial malleolus
is most likely either a chronic fracture or an an accessory
ossification center.

## 2018-01-27 MED ORDER — OXYCODONE-ACETAMINOPHEN 5-325 MG PO TABS: 1.0000 | ORAL_TABLET | Freq: Three times a day (TID) | ORAL | 0 refills | Status: DC | PRN

## 2018-01-27 MED ORDER — OXYCODONE-ACETAMINOPHEN 5-325 MG PO TABS
1.0000 | ORAL_TABLET | Freq: Once | ORAL | Status: AC
  Administered 2018-01-27: 1 via ORAL
  Filled 2018-01-27: qty 1

## 2018-01-27 NOTE — ED Provider Notes (Signed)
[facility] DEPT Provider Note   CSN: [id number] Arrival date & time: 01/27/18  1251     History   Chief Complaint Chief Complaint  Patient presents with  . Ankle Pain    HPI William Ali is a 33 y.o. male.  HPI  Patient is a 33 year old male with a history of back pain hypertension who presents the emergency department today for evaluation of right medial ankle pain.  Patient recently seen 01/12/2018 for similar pain.  At that time he was diagnosed with an avulsion fracture of the medial malleolus of the right tibia.  He states that this was an atraumatic injury.  He had been having improvement of his symptoms however last night pain recurred.   He states that he has been walking with a cam walker.  He has been using crutches for most of the time however did not use crutches on New Year's Eve night. He denies any known injuries.  He states that the pressure from the cam walker or exacerbates his pain.  His pain is constant in nature and is severe.  He tried taking Percocet prior to arrival.  Denies fevers or chills.  He is able to range his ankle without difficulty.  No numbness or weakness. Past Medical History:  Diagnosis Date  . Back pain   . Hypertension     There are no active problems to display for this patient.   Past Surgical History:  Procedure Laterality Date  . KNEE SURGERY          Home Medications    Prior to Admission medications   Medication Sig Start Date End Date Taking? Authorizing Provider  ibuprofen (ADVIL,MOTRIN) 600 MG tablet Take 1 tablet (600 mg total) by mouth every 6 (six) hours as needed. 04/17/15  Yes Domenic Moras, PA-C  oxyCODONE-acetaminophen (PERCOCET/ROXICET) 5-325 MG tablet Take 1 tablet by mouth every 8 (eight) hours as needed for severe pain. 01/27/18   Sajid Ruppert S, PA-C    Family History Family History  Problem Relation Age of Onset  . Hypertension Mother   . Diabetes Father     Social  History Social History   Tobacco Use  . Smoking status: Never Smoker  . Smokeless tobacco: Never Used  Substance Use Topics  . Alcohol use: No  . Drug use: No     Allergies   Onion; Other; and Peanut-containing drug products   Review of Systems Review of Systems  Constitutional: Negative for chills and fever.  Musculoskeletal:       Right ankle pain  Skin: Negative for wound.  Neurological: Negative for weakness and numbness.     Physical Exam Updated Vital Signs BP (!) 159/106 (BP Location: Right Arm)   Pulse 68   Temp 98.1 F (36.7 C) (Oral)   Resp 19   SpO2 100%   Physical Exam Constitutional:      General: He is not in acute distress.    Appearance: He is well-developed.  Eyes:     Conjunctiva/sclera: Conjunctivae normal.  Cardiovascular:     Rate and Rhythm: Normal rate.  Pulmonary:     Effort: Pulmonary effort is normal.  Musculoskeletal:     Comments: TTP to the medial malleolus. No swelling, fluctuance or induration. Dorsiflexion and plantarflexion intact, however there is pain with plantarflexion. No TTP to the lateral malleolus, achilles, or throughout the foot. No sensory changes.   Skin:    General: Skin is warm and dry.  Neurological:  Mental Status: He is alert and oriented to person, place, and time.      ED Treatments / Results  Labs (all labs ordered are listed, but only abnormal results are displayed) Labs Reviewed - No data to display  EKG None  Radiology Dg Ankle Complete Right  Result Date: 01/27/2018 CLINICAL DATA:  Pt states he was told about 2 weeks ago that he broke his right ankle, but before that a different hospital told him he had gout. Symptoms were getting better but they have gotten much worse since yesterday. Medial aspect right ankle pain. EXAM: RIGHT ANKLE - COMPLETE 3+ VIEW COMPARISON:  01/12/2018 FINDINGS: No acute fracture.  No bone lesion. Well corticated ossified density lies along the inferior margin of the  medial malleolus consistent with an old avulsion fracture or an accessory ossification center, unchanged from the prior study. The ankle mortise is normally spaced and aligned. Small dorsal calcaneal spur. Soft tissues are unremarkable. IMPRESSION: 1. No fracture or acute finding. 2. Previously described avulsion fracture from the medial malleolus is most likely either a chronic fracture or an an accessory ossification center. Electronically Signed   By: Lajean Manes M.D.   On: 01/27/2018 16:06    Procedures Procedures (including critical care time)  Medications Ordered in ED Medications  oxyCODONE-acetaminophen (PERCOCET/ROXICET) 5-325 MG per tablet 1 tablet (1 tablet Oral Given 01/27/18 1459)     Initial Impression / Assessment and Plan / ED Course  I have reviewed the triage vital signs and the nursing notes.  Pertinent labs & imaging results that were available during my care of the patient were reviewed by me and considered in my medical decision making (see chart for details).   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  Has been using cam walker at home and has been using crutches most of the time however he does not have them with him today.  He does have some tenderness over the medial malleolus.  No significant swelling, warmth or erythema present.  No fevers or chills to suggest septic arthritis or other concerning pathology that would require further work-up at this time.  His x-ray today redemonstrates the avulsion fracture.  Fracture likely chronic or these changes represent accessory ossification center.  His symptoms do not appear consistent with gout  either.  Will give patient additional pain medications and reiterate the importance of following up with the orthopedic doctor.  He currently has cam walker and crutches at home.  Advised him to use these and to return to the ER for new or worsening symptoms in the meantime.  He voiced understanding the plan and reasons to return to the ED.  All questions answered.  ED Discharge Orders         Ordered    oxyCODONE-acetaminophen (PERCOCET/ROXICET) 5-325 MG tablet  Every 8 hours PRN     01/27/18 [address], Besan Ketchem S, PA-C 01/27/18 1628    Daleen Bo, MD 01/29/18 [phone]

## 2018-01-27 NOTE — Discharge Instructions (Addendum)
You may alternate taking Tylenol and Ibuprofen as needed for pain control. You may take 400-600 mg of ibuprofen every 6 hours and 500 mg of Tylenol every 6 hours. Do not exceed 4000 mg of Tylenol daily as this can lead to liver damage. Also, make sure to take Ibuprofen with meals as it can cause an upset stomach. Do not take other NSAIDs while taking Ibuprofen such as (Aleve, Naprosyn, Aspirin, Celebrex, etc) and do not take more than the prescribed dose as this can lead to ulcers and bleeding in your GI tract. You may use warm and cold compresses to help with your symptoms.   Prescription given for percocet. Take medication as directed and do not operate machinery, drive a car, or work while taking this medication as it can make you drowsy.    Please follow up with your primary doctor within the next 7-10 days for re-evaluation and further treatment of your symptoms. You need to follow up with the orthopedic doctor for evaluation of your symptoms.  Please return to the ER sooner if you have any new or worsening symptoms.

## 2018-01-27 NOTE — ED Triage Notes (Signed)
Was told approximately 2 weeks ago that he has a broken right ankle. Boot intact and in place today. Today pt reports unbearable, increasing pain. Pain 9/10 and patient reports he took Percocet 2 hours ago PTA with minimal relief.

## 2018-04-16 ENCOUNTER — Other Ambulatory Visit: Payer: Self-pay

## 2018-04-16 ENCOUNTER — Encounter (HOSPITAL_COMMUNITY): Payer: Self-pay | Admitting: Emergency Medicine

## 2018-04-16 ENCOUNTER — Emergency Department (HOSPITAL_COMMUNITY)
Admission: EM | Admit: 2018-04-16 | Discharge: 2018-04-16 | Disposition: A | Discharge status: Home / Self Care | Payer: Self-pay | Attending: Emergency Medicine | Admitting: Emergency Medicine

## 2018-04-16 DIAGNOSIS — R0789 Other chest pain: Secondary | ICD-10-CM | POA: Insufficient documentation

## 2018-04-16 DIAGNOSIS — R079 Chest pain, unspecified: Secondary | ICD-10-CM

## 2018-04-16 DIAGNOSIS — I10 Essential (primary) hypertension: Secondary | ICD-10-CM | POA: Insufficient documentation

## 2018-04-16 DIAGNOSIS — Z91010 Allergy to peanuts: Secondary | ICD-10-CM | POA: Insufficient documentation

## 2018-04-16 NOTE — ED Triage Notes (Signed)
Pt comes to ed via POV, c/o chest pain sharp on and off only on left side. Started Thursday. Denies any other symptoms. Started goggling and spooked himself.

## 2018-04-16 NOTE — Discharge Instructions (Signed)
It is important to follow-up with your doctor in a week or 2 to have your blood pressure check.  You can also check it on your own by buying a cough, or going to the drugstore and use 1 of the machines there.  Try to stay on a low-sodium diet to improve your blood pressure.  Light regular exercise such as walking a mile or 2 can be helpful.  If you develop worsening symptoms, return here for reevaluation.

## 2018-04-16 NOTE — ED Provider Notes (Signed)
[facility] DEPT Provider Note   CSN: [id number] Arrival date & time: 04/16/18  1932    History   Chief Complaint Chief Complaint  Patient presents with  . Chest Pain    left side     HPI William Ali is a 33 y.o. male.     HPI   He is here for evaluation of intermittent discomfort in his left anterior posterior chest, which comes and goes lasting between minutes, and 10 or so, since yesterday.  No associated shortness of breath, nausea, vomiting, weakness or dizziness.  No prior similar problems.  He has known about his high blood pressure in the past but never been treated for it.  He denies headache, neck pain or back pain.  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  He lives with his wife and children and denies stress currently.  There are no other known modifying factors.  Past Medical History:  Diagnosis Date  . Back pain   . Hypertension     There are no active problems to display for this patient.   Past Surgical History:  Procedure Laterality Date  . KNEE SURGERY          Home Medications    Prior to Admission medications   Medication Sig Start Date End Date Taking? Authorizing Provider  ibuprofen (ADVIL,MOTRIN) 600 MG tablet Take 1 tablet (600 mg total) by mouth every 6 (six) hours as needed. 04/17/15   Domenic Moras, PA-C  oxyCODONE-acetaminophen (PERCOCET/ROXICET) 5-325 MG tablet Take 1 tablet by mouth every 8 (eight) hours as needed for severe pain. 01/27/18   Couture, Cortni S, PA-C    Family History Family History  Problem Relation Age of Onset  . Hypertension Mother   . Diabetes Father     Social History Social History   Tobacco Use  . Smoking status: Never Smoker  . Smokeless tobacco: Never Used  Substance Use Topics  . Alcohol use: No  . Drug use: No     Allergies   Onion; Other; and Peanut-containing drug products   Review of Systems Review of Systems  All other systems reviewed and are negative.    Physical Exam Updated Vital Signs BP (!) 189/103 (BP Location: Left Arm)   Pulse 85   Temp 98.6 F (37 C) (Oral)   Resp 12   SpO2 99%   Physical Exam Vitals signs and nursing note reviewed.  Constitutional:      Appearance: He is well-developed.  HENT:     Head: Normocephalic and atraumatic.     Right Ear: External ear normal.     Left Ear: External ear normal.  Eyes:     Conjunctiva/sclera: Conjunctivae normal.     Pupils: Pupils are equal, round, and reactive to light.  Neck:     Musculoskeletal: Normal range of motion and neck supple.     Trachea: Phonation normal.  Cardiovascular:     Rate and Rhythm: Normal rate and regular rhythm.     Heart sounds: Normal heart sounds.  Pulmonary:     Effort: Pulmonary effort is normal.     Breath sounds: Normal breath sounds.  Chest:     Chest wall: No tenderness.  Musculoskeletal: Normal range of motion.  Skin:    General: Skin is warm and dry.  Neurological:     Mental Status: He is alert and oriented to person, place, and time.     Cranial Nerves: No cranial nerve deficit.  Sensory: No sensory deficit.     Motor: No abnormal muscle tone.     Coordination: Coordination normal.  Psychiatric:        Mood and Affect: Mood normal.        Behavior: Behavior normal.        Thought Content: Thought content normal.        Judgment: Judgment normal.      ED Treatments / Results  Labs (all labs ordered are listed, but only abnormal results are displayed) Labs Reviewed - No data to display  EKG EKG Interpretation  Date/Time:  Saturday April 16 2018 19:38:37 EDT Ventricular Rate:  83 PR Interval:    QRS Duration: 77 QT Interval:  347 QTC Calculation: 408 R Axis:   33 Text Interpretation:  Sinus rhythm Probable left atrial enlargement ST elev, probable normal early repol pattern since last tracing no significant change Confirmed by Daleen Bo [phone]) on 04/16/2018 7:58:14 PM   Radiology No results found.   Procedures Procedures (including critical care time)  Medications Ordered in ED Medications - No data to display   Initial Impression / Assessment and Plan / ED Course  I have reviewed the triage vital signs and the nursing notes.  Pertinent labs & imaging results that were available during my care of the patient were reviewed by me and considered in my medical decision making (see chart for details).         Patient Vitals for the past 24 hrs:  BP Temp Temp src Pulse Resp SpO2  04/16/18 1939 (!) 189/103 98.6 F (37 C) Oral 85 12 99 %    8:14 PM Reevaluation with update and discussion. After initial assessment and treatment, an updated evaluation reveals no change in clinical status.  Findings discussed with the patient and all questions were answered. Daleen Bo   Medical Decision Making: Nonspecific chest pain, with mild high blood pressure.  Doubt ACS, PE or pneumonia.  Doubt hypertensive urgency.  CRITICAL CARE-no Performed by: Daleen Bo  Nursing Notes Reviewed/ Care Coordinated Applicable Imaging Reviewed Interpretation of Laboratory Data incorporated into ED treatment  The patient appears reasonably screened and/or stabilized for discharge and I doubt any other medical condition or other [facility] requiring further screening, evaluation, or treatment in the ED at this time prior to discharge.  Plan: Home Medications-OTC analgesia of choice; Home Treatments-rest, fluids, light exercise; return here if the recommended treatment, does not improve the symptoms; Recommended follow up-PCP follow-up 1 week and as needed   Final Clinical Impressions(s) / ED Diagnoses   Final diagnoses:  Nonspecific chest pain  Hypertension, unspecified type    ED Discharge Orders    None       Daleen Bo, MD 04/16/18 2015

## 2018-04-21 ENCOUNTER — Ambulatory Visit (HOSPITAL_COMMUNITY)
Admission: EM | Admit: 2018-04-21 | Discharge: 2018-04-21 | Disposition: A | Discharge status: Home / Self Care | Payer: Self-pay | Attending: Family Medicine | Admitting: Family Medicine

## 2018-04-21 ENCOUNTER — Encounter (HOSPITAL_COMMUNITY): Payer: Self-pay | Admitting: Emergency Medicine

## 2018-04-21 ENCOUNTER — Other Ambulatory Visit: Payer: Self-pay

## 2018-04-21 DIAGNOSIS — K219 Gastro-esophageal reflux disease without esophagitis: Secondary | ICD-10-CM

## 2018-04-21 DIAGNOSIS — R0789 Other chest pain: Secondary | ICD-10-CM

## 2018-04-21 DIAGNOSIS — I10 Essential (primary) hypertension: Secondary | ICD-10-CM

## 2018-04-21 MED ORDER — AMLODIPINE BESYLATE 5 MG PO TABS: 5.0000 mg | ORAL_TABLET | Freq: Every day | ORAL | 5 refills | Status: DC

## 2018-04-21 MED ORDER — FAMOTIDINE 20 MG PO TABS: 20.0000 mg | ORAL_TABLET | Freq: Two times a day (BID) | ORAL | 6 refills | Status: DC

## 2018-04-21 NOTE — ED Triage Notes (Signed)
Seen at [facility] on Saturday for chest pain.  Did ekg and was told bp high.  Chest pain, sob, and get tired easily.    Symptoms intermittent over the past 2 days.    Health department told patient that his symptoms are not related to covid 19.  Told him symptoms related to blood pressure and to go to ucc now.    Center chest discomfort that is a 4-5/10.  Pain is sharp at times and then "dull, nagging " pain.   Slight nausea this morning.

## 2018-04-21 NOTE — ED Provider Notes (Signed)
[facility]    CSN: [id number] Arrival date & time: 04/21/18  1529     History   Chief Complaint No chief complaint on file.   HPI William Ali is a 33 y.o. male.   33 yo man here for reevaluation of chest pain 5 days after ED visit.  Over the years, this young man has had 71 different EKG's, all read as normal.  He's also had multiple visits over the past several years, during which his blood pressure was always elevated but never treated.  The chest pain remains constant and sternal.  No change with food or position or activity.  He had a slight dry cough last weekend which has resolved.  No fever.  He was sent home from [facility] because he mentioned the cough.  He has a h/o severe reflux which at one time interfered with swallowing.   He has never been offered treatment for blood pressure, reflux or the chest pain itself.  Note from 3/21 ED visit: He is here for evaluation of intermittent discomfort in his left anterior posterior chest, which comes and goes lasting between minutes, and 10 or so, since yesterday.  No associated shortness of breath, nausea, vomiting, weakness or dizziness.  No prior similar problems.  He has known about his high blood pressure in the past but never been treated for it.  He denies headache, neck pain or back pain.  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  He lives with his wife and children and denies stress currently.  There are no other known modifying factors.      Past Medical History:  Diagnosis Date  . Back pain   . Hypertension     There are no active problems to display for this patient.   Past Surgical History:  Procedure Laterality Date  . KNEE SURGERY         Home Medications    Prior to Admission medications   Medication Sig Start Date End Date Taking? Authorizing Provider  amLODipine (NORVASC) 5 MG tablet Take 1 tablet (5 mg total) by mouth daily. 04/21/18   Robyn Haber, MD  famotidine (PEPCID) 20 MG  tablet Take 1 tablet (20 mg total) by mouth 2 (two) times daily. 04/21/18   Robyn Haber, MD    Family History Family History  Problem Relation Age of Onset  . Hypertension Mother   . Diabetes Father     Social History Social History   Tobacco Use  . Smoking status: Never Smoker  . Smokeless tobacco: Never Used  Substance Use Topics  . Alcohol use: No  . Drug use: No     Allergies   Onion; Other; and Peanut-containing drug products   Review of Systems Review of Systems   Physical Exam Triage Vital Signs ED Triage Vitals  Enc Vitals Group     BP      Pulse      Resp      Temp      Temp src      SpO2      Weight      Height      Head Circumference      Peak Flow      Pain Score      Pain Loc      Pain Edu?      Excl. in [place]?    No data found.  Updated Vital Signs BP (!) 151/94 (BP Location: Right Arm) Comment (BP Location): large  cuff  Pulse 97   Temp 97.9 F (36.6 C) (Oral)   Resp 16   SpO2 98%    Physical Exam Vitals signs and nursing note reviewed.  Constitutional:      Appearance: Normal appearance.  HENT:     Head: Normocephalic.     Nose: Nose normal.     Mouth/Throat:     Pharynx: Oropharynx is clear.  Eyes:     Conjunctiva/sclera: Conjunctivae normal.  Neck:     Musculoskeletal: Normal range of motion and neck supple.  Cardiovascular:     Rate and Rhythm: Normal rate and regular rhythm.     Heart sounds: Normal heart sounds.  Pulmonary:     Effort: Pulmonary effort is normal.     Breath sounds: Normal breath sounds.  Abdominal:     General: Bowel sounds are normal.  Musculoskeletal: Normal range of motion.  Skin:    General: Skin is warm and dry.  Neurological:     General: No focal deficit present.     Mental Status: He is alert and oriented to person, place, and time.  Psychiatric:        Mood and Affect: Mood normal.        Behavior: Behavior normal.      UC Treatments / Results  Labs (all labs ordered are  listed, but only abnormal results are displayed) Labs Reviewed - No data to display  EKG None  Radiology No results found.  Procedures Procedures (including critical care time)  Medications Ordered in UC Medications - No data to display  Initial Impression / Assessment and Plan / UC Course  I have reviewed the triage vital signs and the nursing notes.  Pertinent labs & imaging results that were available during my care of the patient were reviewed by me and considered in my medical decision making (see chart for details).    Final Clinical Impressions(s) / UC Diagnoses   Final diagnoses:  Atypical chest pain  Gastroesophageal reflux disease, esophagitis presence not specified  Essential hypertension   Discharge Instructions   None    ED Prescriptions    Medication Sig Dispense Auth. Provider   famotidine (PEPCID) 20 MG tablet Take 1 tablet (20 mg total) by mouth 2 (two) times daily. 30 tablet Robyn Haber, MD   amLODipine (NORVASC) 5 MG tablet Take 1 tablet (5 mg total) by mouth daily. 30 tablet Robyn Haber, MD     Controlled Substance Prescriptions [place] Controlled Substance Registry consulted? Not Applicable   Robyn Haber, MD 04/21/18 [phone]

## 2018-04-22 ENCOUNTER — Encounter (HOSPITAL_COMMUNITY): Payer: Self-pay | Admitting: Emergency Medicine

## 2018-04-22 ENCOUNTER — Ambulatory Visit (HOSPITAL_COMMUNITY)
Admission: EM | Admit: 2018-04-22 | Discharge: 2018-04-22 | Disposition: A | Discharge status: Home / Self Care | Payer: Self-pay | Attending: Family Medicine | Admitting: Family Medicine

## 2018-04-22 ENCOUNTER — Ambulatory Visit (INDEPENDENT_AMBULATORY_CARE_PROVIDER_SITE_OTHER): Payer: Self-pay

## 2018-04-22 DIAGNOSIS — M2141 Flat foot [pes planus] (acquired), right foot: Secondary | ICD-10-CM

## 2018-04-22 DIAGNOSIS — M79671 Pain in right foot: Secondary | ICD-10-CM

## 2018-04-22 DIAGNOSIS — R2241 Localized swelling, mass and lump, right lower limb: Secondary | ICD-10-CM

## 2018-04-22 IMAGING — DX RIGHT FOOT COMPLETE - 3+ VIEW
3 series · 3 of 3 positions shown · non-contrast
Comparison: [DATE]

CLINICAL DATA: Acute onset of foot pain today with no evidence of
trauma. Previous fracture in [REDACTED].

EXAM:
RIGHT FOOT COMPLETE - 3+ VIEW

[foot ap]
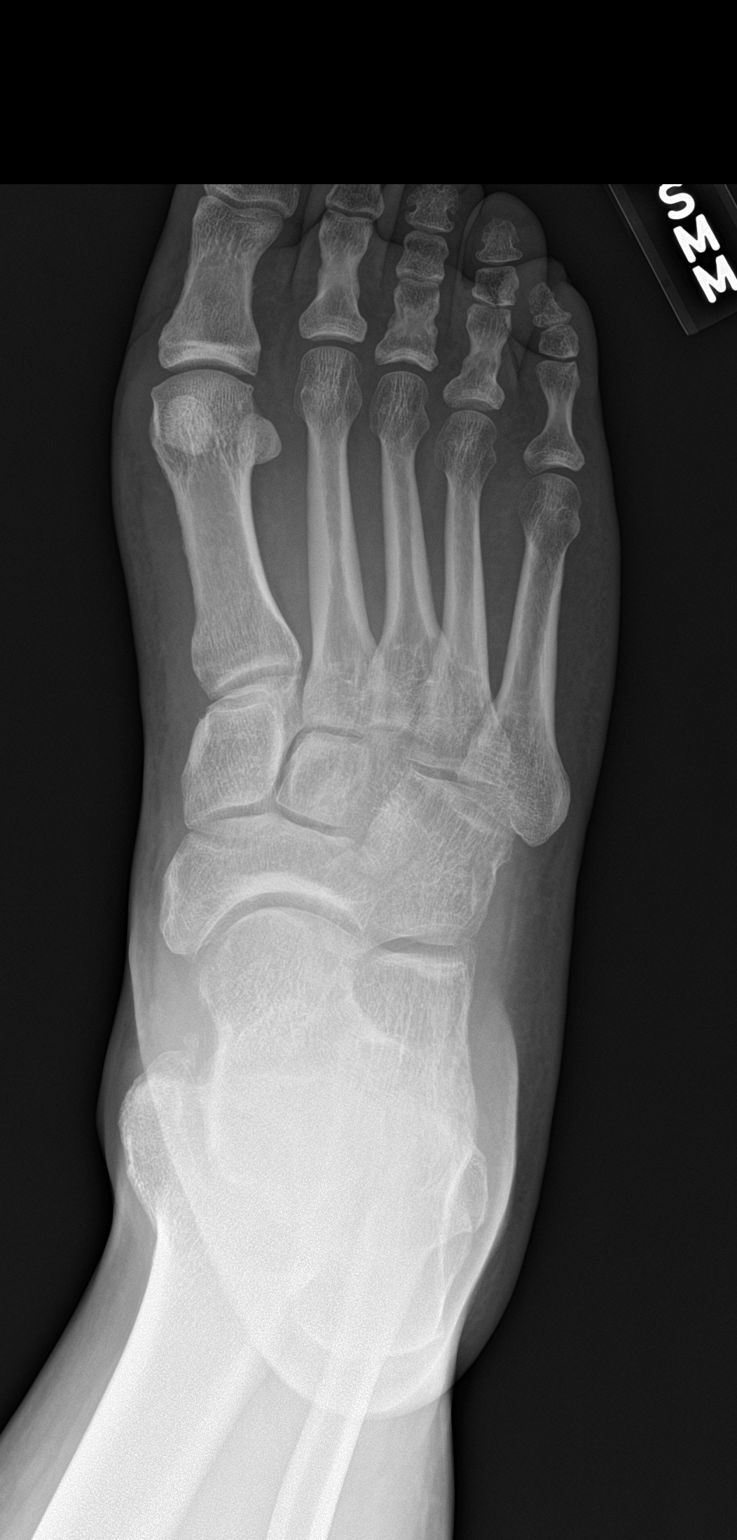

[foot obl]
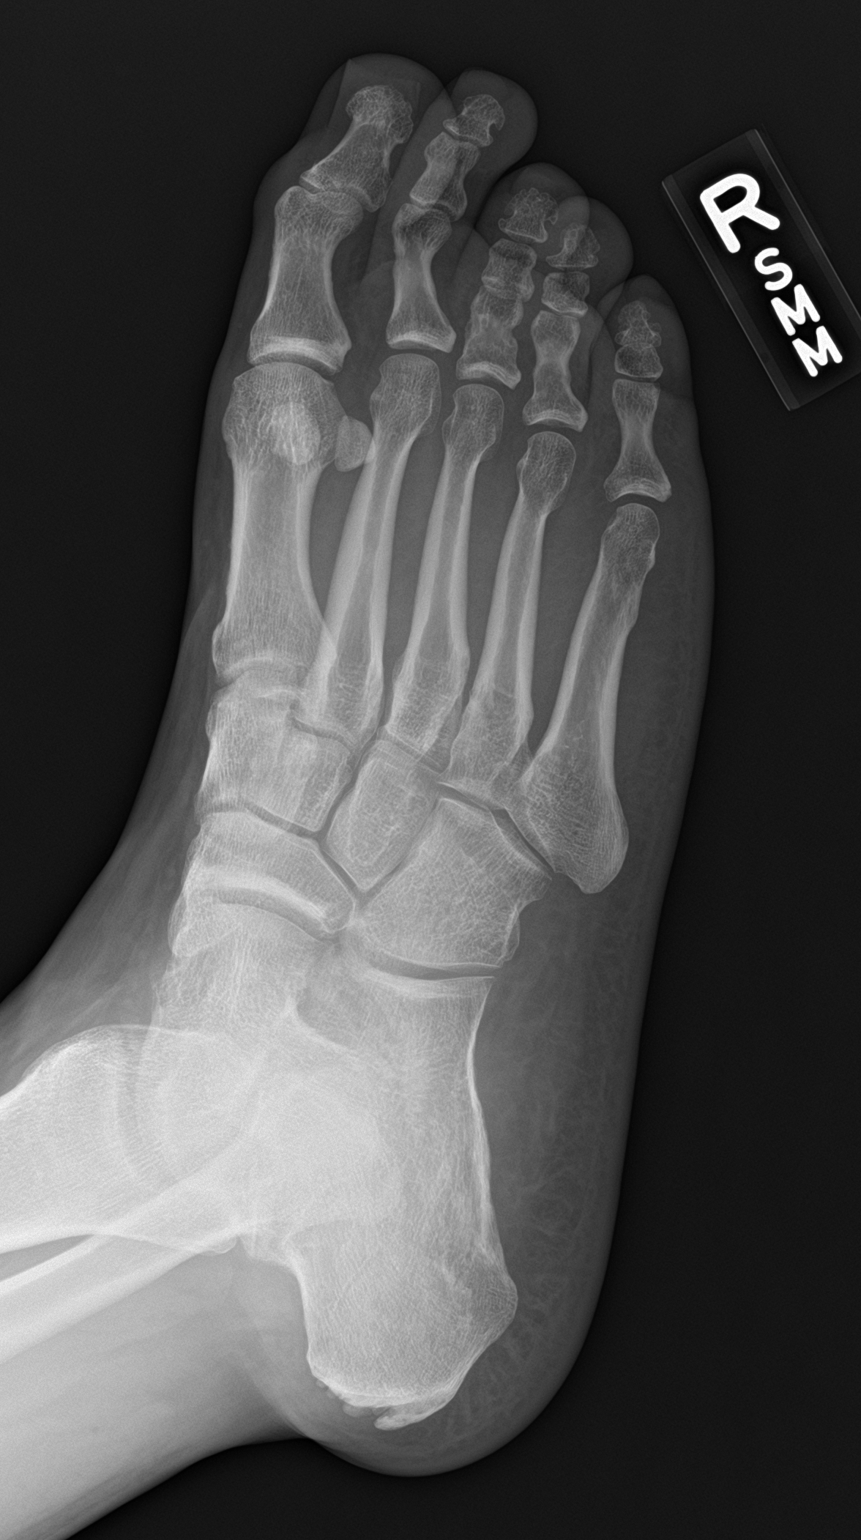

[foot lat]
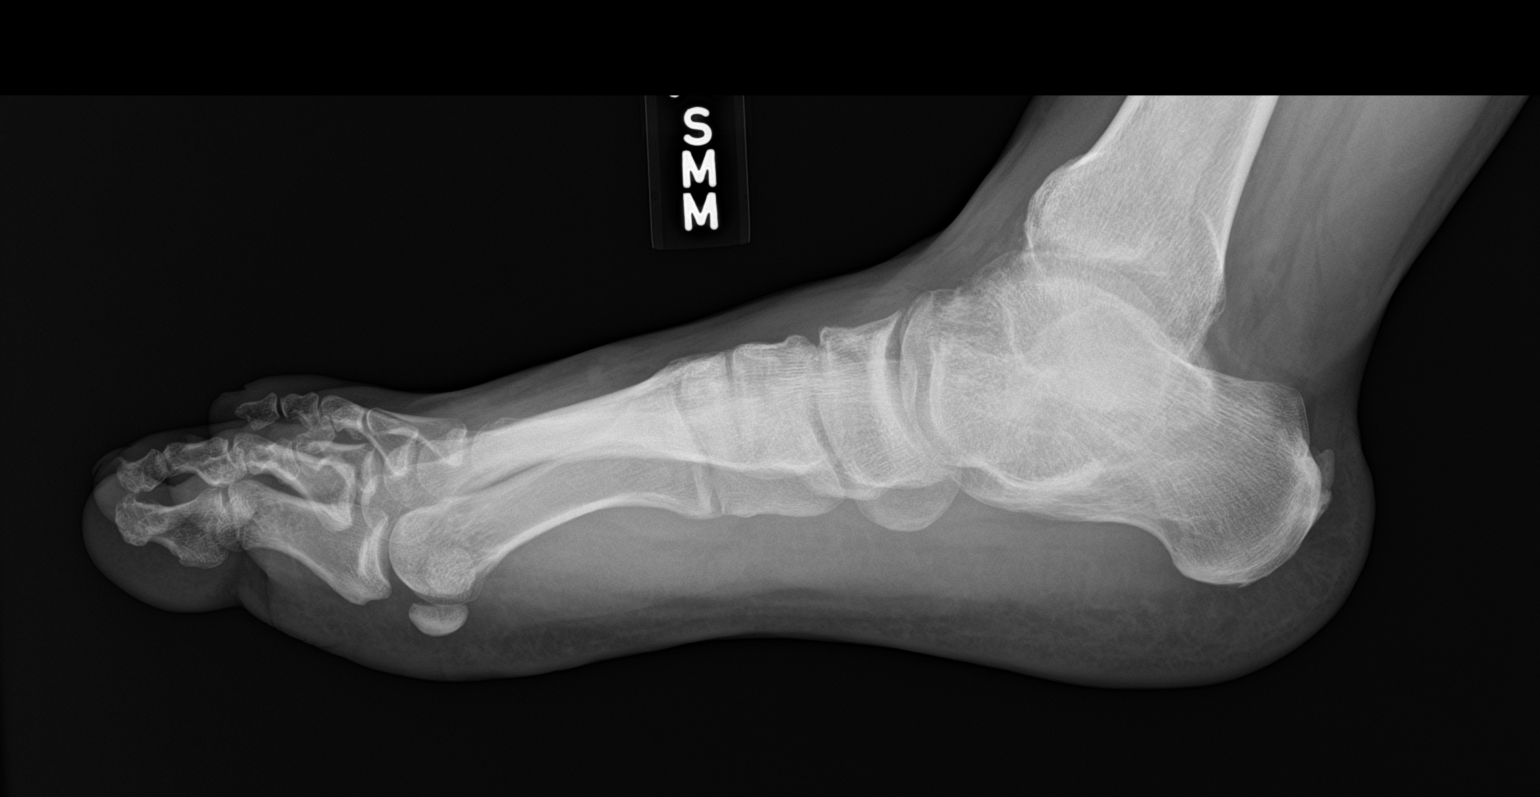

[3 of 3 positions shown; findings below may reference images not displayed]

FINDINGS: There is no evidence of fracture or dislocation. There is no
evidence of arthropathy or other focal bone abnormality. Soft
tissues are unremarkable. The patient does have pes planus.
IMPRESSION: Pes planus.  No acute or traumatic finding.

## 2018-04-22 MED ORDER — TRAMADOL HCL 50 MG PO TABS: 50.0000 mg | ORAL_TABLET | Freq: Four times a day (QID) | ORAL | 0 refills | Status: DC | PRN

## 2018-04-22 MED ORDER — PREDNISONE 20 MG PO TABS: ORAL_TABLET | ORAL | 0 refills | Status: DC

## 2018-04-22 NOTE — ED Triage Notes (Signed)
Pt states this morning he woke up and his R foot was painful, denies injury, states it hurt to try and put his shoe on. Pt using home crutches.

## 2018-04-22 NOTE — Discharge Instructions (Signed)
Your symptoms are suspicious for gout. I am treating your symptoms as an acute gout attack. I recommend further work-up with blood work if these symptoms develop in the future.

## 2018-04-22 NOTE — ED Provider Notes (Signed)
[facility]    CSN: [id number] Arrival date & time: 04/22/18  1718     History   Chief Complaint Chief Complaint  Patient presents with  . Foot Pain    HPI William Ali is a 33 y.o. male.   HPI Patient presents today with a complaint of right foot pain and swelling. No injury. He is s/p closed avulsion fraction of right medial malleolus and was in a cast until end of January. He reports stepping out of bed and experienced significant at the distal 5th metatarsal of lateral side of right foot. As the day progressed, pain worsened and was accompanied by localized swelling at the talus region of the right foot. He denies any known injury or twisting of right foot. No new shoes.  He has not history of gout, although he reports concern for gout given symptoms. He doesn't drink alcohol. He is allergic to seafood. Endorses heavy intake of red meat. Past Surgical History:  Procedure Laterality Date  . KNEE SURGERY         Home Medications    Prior to Admission medications   Medication Sig Start Date End Date Taking? Authorizing Provider  amLODipine (NORVASC) 5 MG tablet Take 1 tablet (5 mg total) by mouth daily. 04/21/18   Robyn Haber, MD  famotidine (PEPCID) 20 MG tablet Take 1 tablet (20 mg total) by mouth 2 (two) times daily. 04/21/18   Robyn Haber, MD    Family History Family History  Problem Relation Age of Onset  . Hypertension Mother   . Diabetes Father     Social History Social History   Tobacco Use  . Smoking status: Never Smoker  . Smokeless tobacco: Never Used  Substance Use Topics  . Alcohol use: No  . Drug use: No     Allergies   Onion; Other; and Peanut-containing drug products   Review of Systems Review of Systems Pertinent negatives listed in HPI Physical Exam Triage Vital Signs ED Triage Vitals  Enc Vitals Group     BP 04/22/18 1735 (!) 146/95     Pulse Rate 04/22/18 1735 (!) 112     Resp 04/22/18 1735 16     Temp  04/22/18 1735 98.2 F (36.8 C)     Temp src --      SpO2 04/22/18 1735 99 %     Weight --      Height --      Head Circumference --      Peak Flow --      Pain Score 04/22/18 1736 8     Pain Loc --      Pain Edu? --      Excl. in [place]? --    No data found.  Updated Vital Signs BP (!) 146/95   Pulse (!) 112   Temp 98.2 F (36.8 C)   Resp 16   SpO2 99%   Visual Acuity Right Eye Distance:   Left Eye Distance:   Bilateral Distance:    Right Eye Near:   Left Eye Near:    Bilateral Near:     Physical Exam General appearance: alert, well developed, well nourished, cooperative and in no distress Head: Normocephalic, without obvious abnormality, atraumatic Respiratory: Respirations even and unlabored, normal respiratory rate Heart: rate and rhythm normal. No gallop or murmurs noted on exam  Abdomen: BS +, no distention, no rebound tenderness, or no mass Extremities: Right Foot: tenderness with palpation of right distal metatarsal, mild edema talus  region of foot. No other gross deformities Skin: Skin color, texture, turgor normal. No rashes seen  Psych: Appropriate mood and affect. Neurologic: Mental status: Alert, oriented to person, place, and time, thought content appropriate.  UC Treatments / Results  Labs (all labs ordered are listed, but only abnormal results are displayed) Labs Reviewed - No data to display  EKG None  Radiology No results found.  Procedures Procedures (including critical care time)  Medications Ordered in UC Medications - No data to display  Initial Impression / Assessment and Plan / UC Course  I have reviewed the triage vital signs and the nursing notes.  Pertinent labs & imaging results that were available during my care of the patient were reviewed by me and considered in my medical decision making (see chart for details).    Patient presents with acute onset right foot pain with swelling, Imaging of the right foot is unremarkable.  Given symptoms and physical exam findings, I suspect possible gout and will treat as follows: Prednisone taper x 9 days and short course of Tramadol. Patient is advised to follow-up with a PCP for management of hypertension and further evaluation of possible gout. Patient verbalized understanding and agreement with plan. Final Clinical Impressions(s) / UC Diagnoses   Final diagnoses:  Acute foot pain, right  Localized swelling of right foot     Discharge Instructions     Your symptoms are suspicious for gout. I am treating your symptoms as an acute gout attack. I recommend further work-up with blood work if these symptoms develop in the future.    ED Prescriptions    Medication Sig Dispense Auth. Provider   predniSONE (DELTASONE) 20 MG tablet Take 3 PO QAM x3days, 2 PO QAM x3days, 1 PO QAM x3days 18 tablet Scot Jun, FNP   traMADol (ULTRAM) 50 MG tablet Take 1 tablet (50 mg total) by mouth every 6 (six) hours as needed. 15 tablet Scot Jun, FNP     Controlled Substance Prescriptions [name] Controlled Substance Registry consulted? Not Applicable   Scot Jun, FNP 04/23/18 1200

## 2018-04-24 ENCOUNTER — Emergency Department (HOSPITAL_COMMUNITY)
Admission: EM | Admit: 2018-04-24 | Discharge: 2018-04-24 | Disposition: A | Discharge status: Home / Self Care | Payer: Self-pay | Attending: Emergency Medicine | Admitting: Emergency Medicine

## 2018-04-24 ENCOUNTER — Emergency Department (HOSPITAL_COMMUNITY): Payer: Self-pay

## 2018-04-24 ENCOUNTER — Other Ambulatory Visit: Payer: Self-pay

## 2018-04-24 DIAGNOSIS — N289 Disorder of kidney and ureter, unspecified: Secondary | ICD-10-CM

## 2018-04-24 DIAGNOSIS — R911 Solitary pulmonary nodule: Secondary | ICD-10-CM

## 2018-04-24 DIAGNOSIS — R079 Chest pain, unspecified: Secondary | ICD-10-CM | POA: Insufficient documentation

## 2018-04-24 DIAGNOSIS — Z91010 Allergy to peanuts: Secondary | ICD-10-CM | POA: Insufficient documentation

## 2018-04-24 DIAGNOSIS — I10 Essential (primary) hypertension: Secondary | ICD-10-CM | POA: Insufficient documentation

## 2018-04-24 DIAGNOSIS — Z79899 Other long term (current) drug therapy: Secondary | ICD-10-CM | POA: Insufficient documentation

## 2018-04-24 LAB — CBC WITH DIFFERENTIAL/PLATELET
Abs Immature Granulocytes: 0.04 10*3/uL (ref 0.00–0.07)
Basophils Absolute: 0.1 10*3/uL (ref 0.0–0.1)
Basophils Relative: 1 %
Eosinophils Absolute: 0.1 10*3/uL (ref 0.0–0.5)
Eosinophils Relative: 1 %
HCT: 44 % (ref 39.0–52.0)
HEMOGLOBIN: 13.6 g/dL (ref 13.0–17.0)
Immature Granulocytes: 0 %
Lymphocytes Relative: 18 %
Lymphs Abs: 2 10*3/uL (ref 0.7–4.0)
MCH: 26.9 pg (ref 26.0–34.0)
MCHC: 30.9 g/dL (ref 30.0–36.0)
MCV: 87.1 fL (ref 80.0–100.0)
Monocytes Absolute: 0.8 10*3/uL (ref 0.1–1.0)
Monocytes Relative: 7 %
Neutro Abs: 8.3 10*3/uL — ABNORMAL HIGH (ref 1.7–7.7)
Neutrophils Relative %: 73 %
Platelets: 345 10*3/uL (ref 150–400)
RBC: 5.05 MIL/uL (ref 4.22–5.81)
RDW: 14.6 % (ref 11.5–15.5)
WBC: 11.3 10*3/uL — ABNORMAL HIGH (ref 4.0–10.5)
nRBC: 0 % (ref 0.0–0.2)

## 2018-04-24 LAB — BASIC METABOLIC PANEL
Anion gap: 11 (ref 5–15)
BUN: 22 mg/dL — ABNORMAL HIGH (ref 6–20)
CHLORIDE: 105 mmol/L (ref 98–111)
CO2: 21 mmol/L — ABNORMAL LOW (ref 22–32)
Calcium: 9.2 mg/dL (ref 8.9–10.3)
Creatinine, Ser: 1.44 mg/dL — ABNORMAL HIGH (ref 0.61–1.24)
GFR calc Af Amer: >60 mL/min (ref >60)
GFR calc non Af Amer: >60 mL/min (ref >60)
Glucose, Bld: 86 mg/dL (ref 70–99)
Potassium: 4.9 mmol/L (ref 3.5–5.1)
Sodium: 137 mmol/L (ref 135–145)

## 2018-04-24 LAB — I-STAT TROPONIN, ED: Troponin i, poc: 0 ng/mL (ref 0.00–0.08)

## 2018-04-24 LAB — D-DIMER, QUANTITATIVE: D-Dimer, Quant: <0.27 ug/mL-FEU (ref 0.00–0.50)

## 2018-04-24 IMAGING — CR CHEST - 2 VIEW
2 series · 2 of 2 positions shown · non-contrast
Comparison: Chest radiograph dated [DATE]

CLINICAL DATA: 32-year-old male with central chest pain.

EXAM:
CHEST - 2 VIEW

[w chest pa]
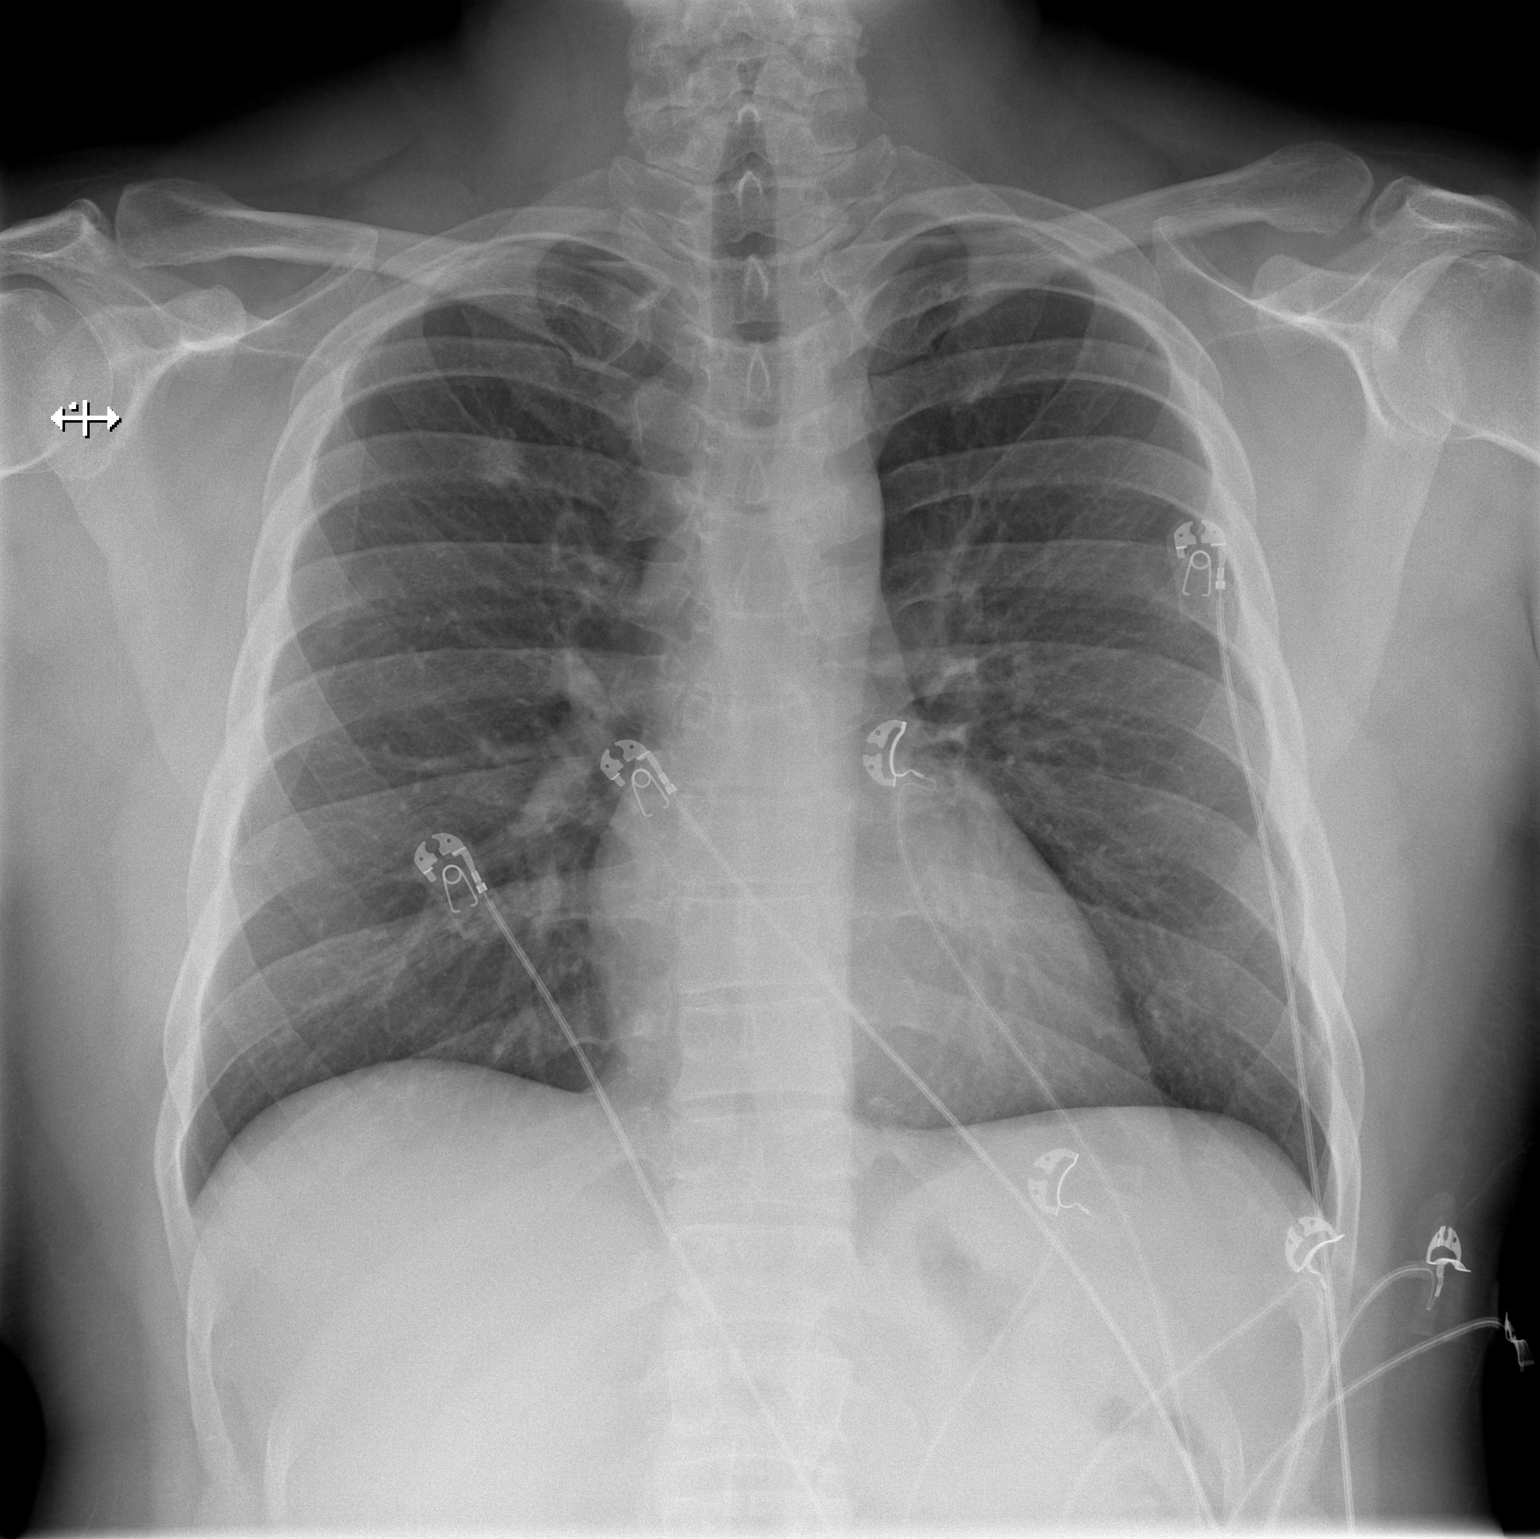

[w chest lat]
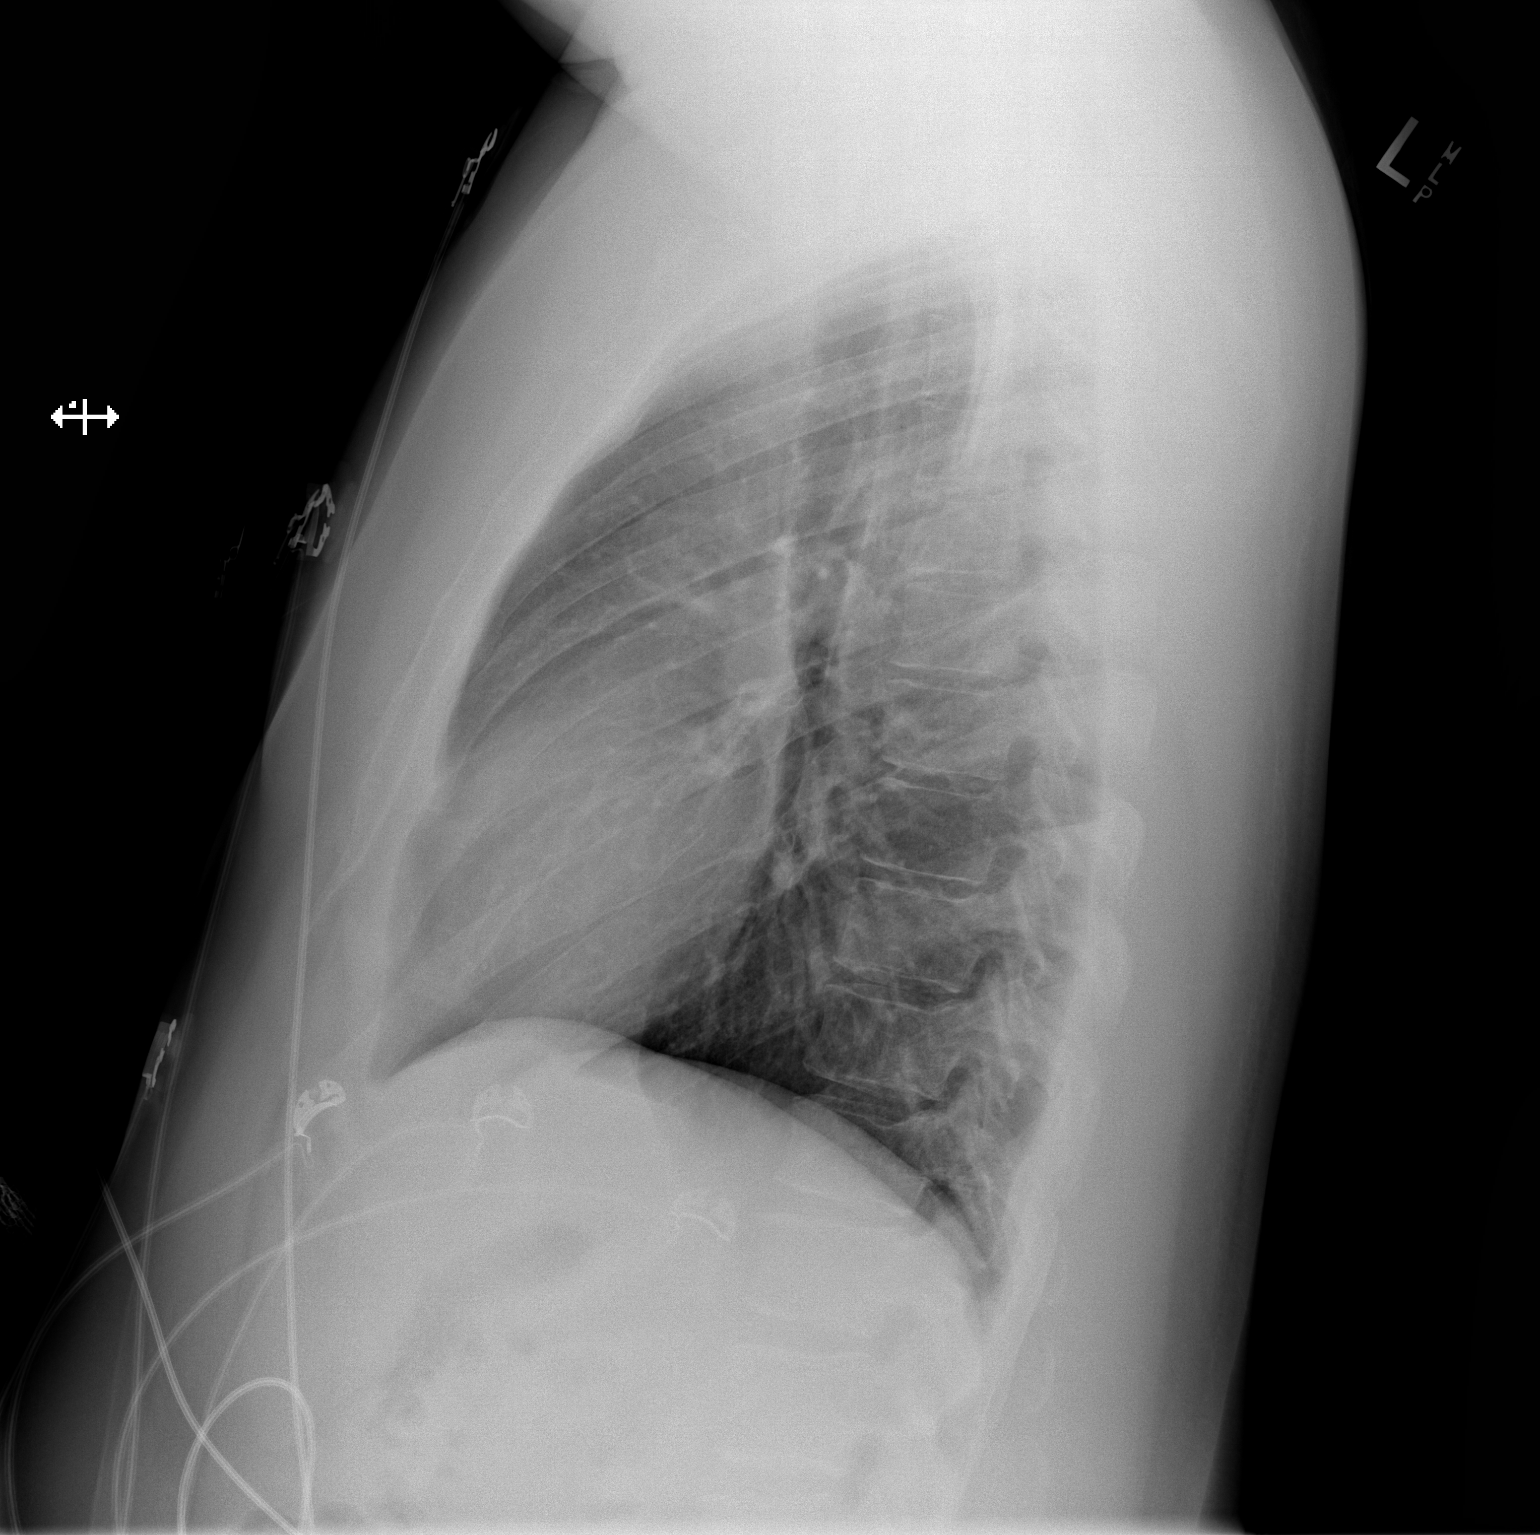

[2 of 2 positions shown; findings below may reference images not displayed]

FINDINGS: There is a 1.7 cm nodular density in the right upper lobe, new since
the prior chest radiograph. Short-term follow-up with radiograph in
1-3 months or further evaluation with chest CT on a nonemergent
basis is recommended. No consolidative changes. There is no pleural
effusion pneumothorax. The cardiac silhouette is within normal
limits. No acute osseous pathology.
IMPRESSION: A 1.7 cm right upper lobe nodule. Follow-up with radiograph in 1-3
months or further evaluation with chest CT on a nonemergent basis
recommended.

## 2018-04-24 MED ORDER — SODIUM CHLORIDE 0.9 % IV SOLN: INTRAVENOUS | Status: DC

## 2018-04-24 NOTE — ED Notes (Signed)
Patient complaining of chest feeling tight "when I breathe"-patient in no distress and speaking full sentences and talking on cell phone. O2 sat 100% RA-MP SR with rate 80's and no ectopy.

## 2018-04-24 NOTE — ED Notes (Signed)
Patient transported to X-ray 

## 2018-04-24 NOTE — Discharge Instructions (Addendum)
You have a pulmonary nodule in the right upper lobe of your lung.  This will require evaluation with a CT within the next 1 to 3 months to exclude serious diseases such as cancer.  Follow-up with your primary care doctor for this.  You also have an elevated creatinine of 1.44.  This could indicate kidney problems due to your high blood pressure.  Make sure you take your blood pressure medication and follow-up with your doctor next week

## 2018-04-24 NOTE — ED Triage Notes (Signed)
Per PT, Pt experiencing gradually increasing pain and SOB in his chest. Pt seen last Saturday for similar pt. Reports nausea no emesis. No cough or fever.

## 2018-04-24 NOTE — ED Provider Notes (Signed)
[facility] DEPT Provider Note   CSN: [id number] Arrival date & time: 04/24/18  1857    History   Chief Complaint Chief Complaint  Patient presents with  . Chest Pain  . Shortness of Breath    HPI William Ali is a 33 y.o. male.     33 year old male presents with several days of left-sided chest discomfort which has occasionally been associated with dyspnea and diaphoresis.  Patient states that the pain is sharp and worse with moving his arms in certain directions.  Has had a mild cough but no fever or chills.  Denies any myalgias.  No leg pain or swelling.  No exertional component to this.  Notes increased stress and anxiety due to being worried about the Covid virus.  He does have a history of hypertension but denies any history coronary disease or premature cardiovascular disease in his family.  Symptoms do seem to improve when he relaxes.  No treatment use prior to arrival     Past Medical History:  Diagnosis Date  . Back pain   . Hypertension     There are no active problems to display for this patient.   Past Surgical History:  Procedure Laterality Date  . KNEE SURGERY          Home Medications    Prior to Admission medications   Medication Sig Start Date End Date Taking? Authorizing Provider  amLODipine (NORVASC) 5 MG tablet Take 1 tablet (5 mg total) by mouth daily. 04/21/18   Robyn Haber, MD  famotidine (PEPCID) 20 MG tablet Take 1 tablet (20 mg total) by mouth 2 (two) times daily. 04/21/18   Robyn Haber, MD  predniSONE (DELTASONE) 20 MG tablet Take 3 PO QAM x3days, 2 PO QAM x3days, 1 PO QAM x3days 04/22/18   Scot Jun, FNP  traMADol (ULTRAM) 50 MG tablet Take 1 tablet (50 mg total) by mouth every 6 (six) hours as needed. 04/22/18   Scot Jun, FNP    Family History Family History  Problem Relation Age of Onset  . Hypertension Mother   . Diabetes Father     Social History Social History    Tobacco Use  . Smoking status: Never Smoker  . Smokeless tobacco: Never Used  Substance Use Topics  . Alcohol use: No  . Drug use: No     Allergies   Onion; Other; and Peanut-containing drug products   Review of Systems Review of Systems  All other systems reviewed and are negative.    Physical Exam Updated Vital Signs BP (!) 165/98   Pulse (!) 102   Temp 98.3 F (36.8 C)   Resp 16   SpO2 100%   Physical Exam Vitals signs and nursing note reviewed.  Constitutional:      General: He is not in acute distress.    Appearance: Normal appearance. He is well-developed. He is not toxic-appearing.  HENT:     Head: Normocephalic and atraumatic.  Eyes:     General: Lids are normal.     Conjunctiva/sclera: Conjunctivae normal.     Pupils: Pupils are equal, round, and reactive to light.  Neck:     Musculoskeletal: Normal range of motion and neck supple.     Thyroid: No thyroid mass.     Trachea: No tracheal deviation.  Cardiovascular:     Rate and Rhythm: Normal rate and regular rhythm.     Heart sounds: Normal heart sounds. No murmur. No gallop.  Pulmonary:     Effort: Pulmonary effort is normal. No respiratory distress.     Breath sounds: Normal breath sounds. No stridor. No decreased breath sounds, wheezing, rhonchi or rales.  Abdominal:     General: Bowel sounds are normal. There is no distension.     Palpations: Abdomen is soft.     Tenderness: There is no abdominal tenderness. There is no rebound.  Musculoskeletal: Normal range of motion.        General: No tenderness.  Skin:    General: Skin is warm and dry.     Findings: No abrasion or rash.  Neurological:     Mental Status: He is alert and oriented to person, place, and time.     GCS: GCS eye subscore is 4. GCS verbal subscore is 5. GCS motor subscore is 6.     Cranial Nerves: No cranial nerve deficit.     Sensory: No sensory deficit.  Psychiatric:        Speech: Speech normal.        Behavior: Behavior  normal.      ED Treatments / Results  Labs (all labs ordered are listed, but only abnormal results are displayed) Labs Reviewed  CBC WITH DIFFERENTIAL/PLATELET  BASIC METABOLIC PANEL  D-DIMER, QUANTITATIVE (NOT AT [facility])  I-STAT TROPONIN, ED    EKG EKG Interpretation  Date/Time:  Sunday April 24 2018 19:05:36 EDT Ventricular Rate:  100 PR Interval:    QRS Duration: 78 QT Interval:  323 QTC Calculation: 417 R Axis:   18 Text Interpretation:  Sinus tachycardia Borderline T wave abnormalities Confirmed by Lacretia Leigh ([id number]) on 04/24/2018 7:15:56 PM   Radiology No results found.  Procedures Procedures (including critical care time)  Medications Ordered in ED Medications  0.9 %  sodium chloride infusion (has no administration in time range)     Initial Impression / Assessment and Plan / ED Course  I have reviewed the triage vital signs and the nursing notes.  Pertinent labs & imaging results that were available during my care of the patient were reviewed by me and considered in my medical decision making (see chart for details).        [name] chest x-ray shows 1.7 cm nodule in the right upper lobe.  Patient instructed to follow-up for this.  EKG without ischemic changes.  D-dimer negative.  Troponin negative as well 2.  Patient admits to increased stress anxiety and states that he had return of his symptoms while here but they were made better when he was able to relax.  Blood pressure and kidney function noted and patient was instructed to follow with his doctor for this.  He is on blood pressure medications at this time.  Final Clinical Impressions(s) / ED Diagnoses   Final diagnoses:  Chest pain    ED Discharge Orders    None       Lacretia Leigh, MD 04/24/18 2056

## 2018-05-01 ENCOUNTER — Emergency Department (HOSPITAL_COMMUNITY)
Admission: EM | Admit: 2018-05-01 | Discharge: 2018-05-01 | Disposition: A | Discharge status: Home / Self Care | Payer: Self-pay | Attending: Emergency Medicine | Admitting: Emergency Medicine

## 2018-05-01 ENCOUNTER — Encounter (HOSPITAL_COMMUNITY): Payer: Self-pay | Admitting: *Deleted

## 2018-05-01 ENCOUNTER — Other Ambulatory Visit: Payer: Self-pay

## 2018-05-01 ENCOUNTER — Emergency Department (HOSPITAL_COMMUNITY): Payer: Self-pay

## 2018-05-01 DIAGNOSIS — Z79899 Other long term (current) drug therapy: Secondary | ICD-10-CM | POA: Insufficient documentation

## 2018-05-01 DIAGNOSIS — R079 Chest pain, unspecified: Secondary | ICD-10-CM

## 2018-05-01 DIAGNOSIS — I10 Essential (primary) hypertension: Secondary | ICD-10-CM | POA: Insufficient documentation

## 2018-05-01 DIAGNOSIS — R7989 Other specified abnormal findings of blood chemistry: Secondary | ICD-10-CM | POA: Insufficient documentation

## 2018-05-01 DIAGNOSIS — R911 Solitary pulmonary nodule: Secondary | ICD-10-CM | POA: Insufficient documentation

## 2018-05-01 DIAGNOSIS — F419 Anxiety disorder, unspecified: Secondary | ICD-10-CM | POA: Insufficient documentation

## 2018-05-01 DIAGNOSIS — R0789 Other chest pain: Secondary | ICD-10-CM | POA: Insufficient documentation

## 2018-05-01 LAB — CBC
HCT: 44.8 % (ref 39.0–52.0)
Hemoglobin: 13.9 g/dL (ref 13.0–17.0)
MCH: 27 pg (ref 26.0–34.0)
MCHC: 31 g/dL (ref 30.0–36.0)
MCV: 87.2 fL (ref 80.0–100.0)
Platelets: 410 10*3/uL — ABNORMAL HIGH (ref 150–400)
RBC: 5.14 MIL/uL (ref 4.22–5.81)
RDW: 14.2 % (ref 11.5–15.5)
WBC: 11.2 10*3/uL — ABNORMAL HIGH (ref 4.0–10.5)
nRBC: 0 % (ref 0.0–0.2)

## 2018-05-01 LAB — BASIC METABOLIC PANEL
Anion gap: 8 (ref 5–15)
BUN: 16 mg/dL (ref 6–20)
CO2: 27 mmol/L (ref 22–32)
Calcium: 9.7 mg/dL (ref 8.9–10.3)
Chloride: 104 mmol/L (ref 98–111)
Creatinine, Ser: 1.42 mg/dL — ABNORMAL HIGH (ref 0.61–1.24)
GFR calc Af Amer: >60 mL/min (ref >60)
GFR calc non Af Amer: >60 mL/min (ref >60)
Glucose, Bld: 94 mg/dL (ref 70–99)
Potassium: 3.5 mmol/L (ref 3.5–5.1)
Sodium: 139 mmol/L (ref 135–145)

## 2018-05-01 LAB — TROPONIN I: Troponin I: <0.03 ng/mL (ref <0.03)

## 2018-05-01 IMAGING — CR CHEST - 2 VIEW
2 series · 2 of 2 positions shown · non-contrast
Comparison: [DATE]

CLINICAL DATA: 32-year-old male with a history of chest tightness

EXAM:
CHEST - 2 VIEW

[w chest pa]
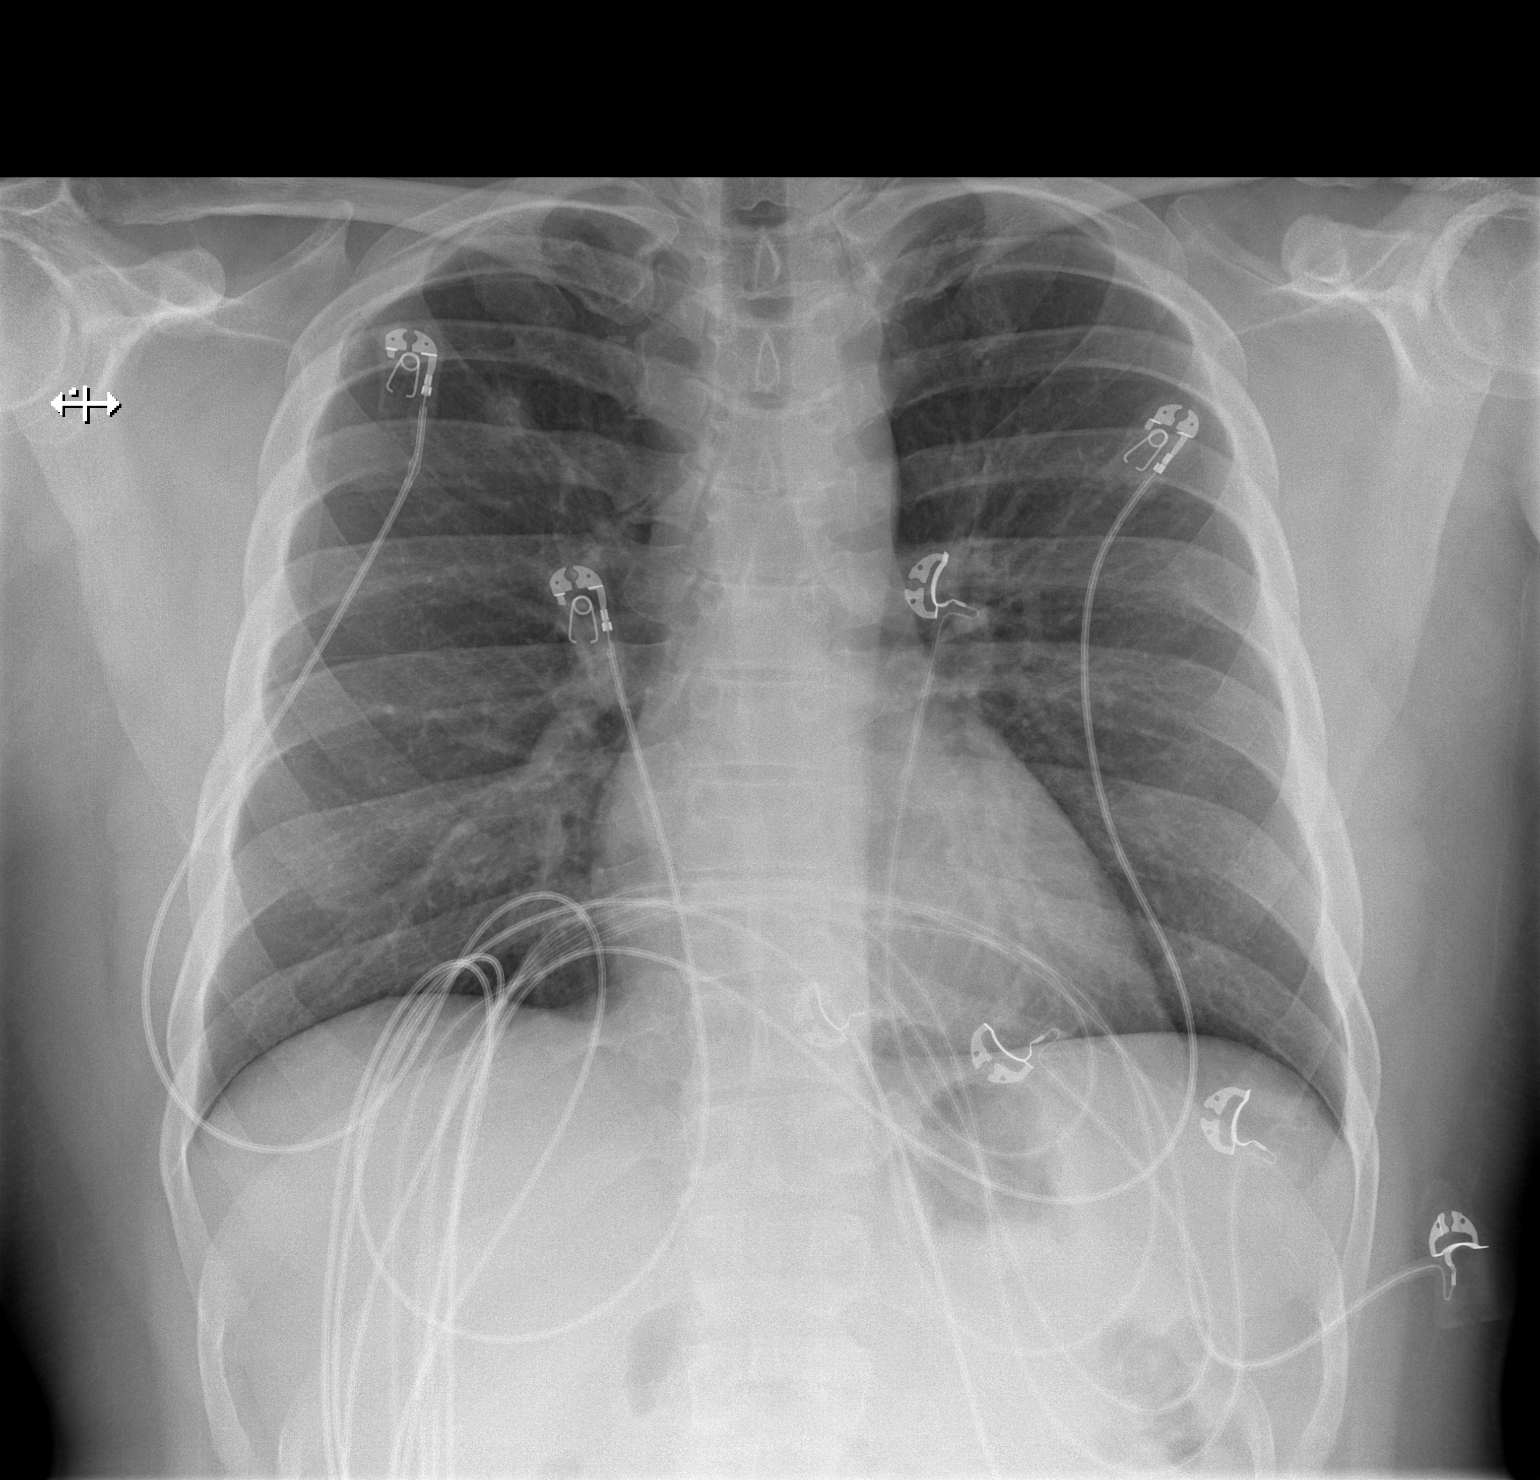

[w chest lat]
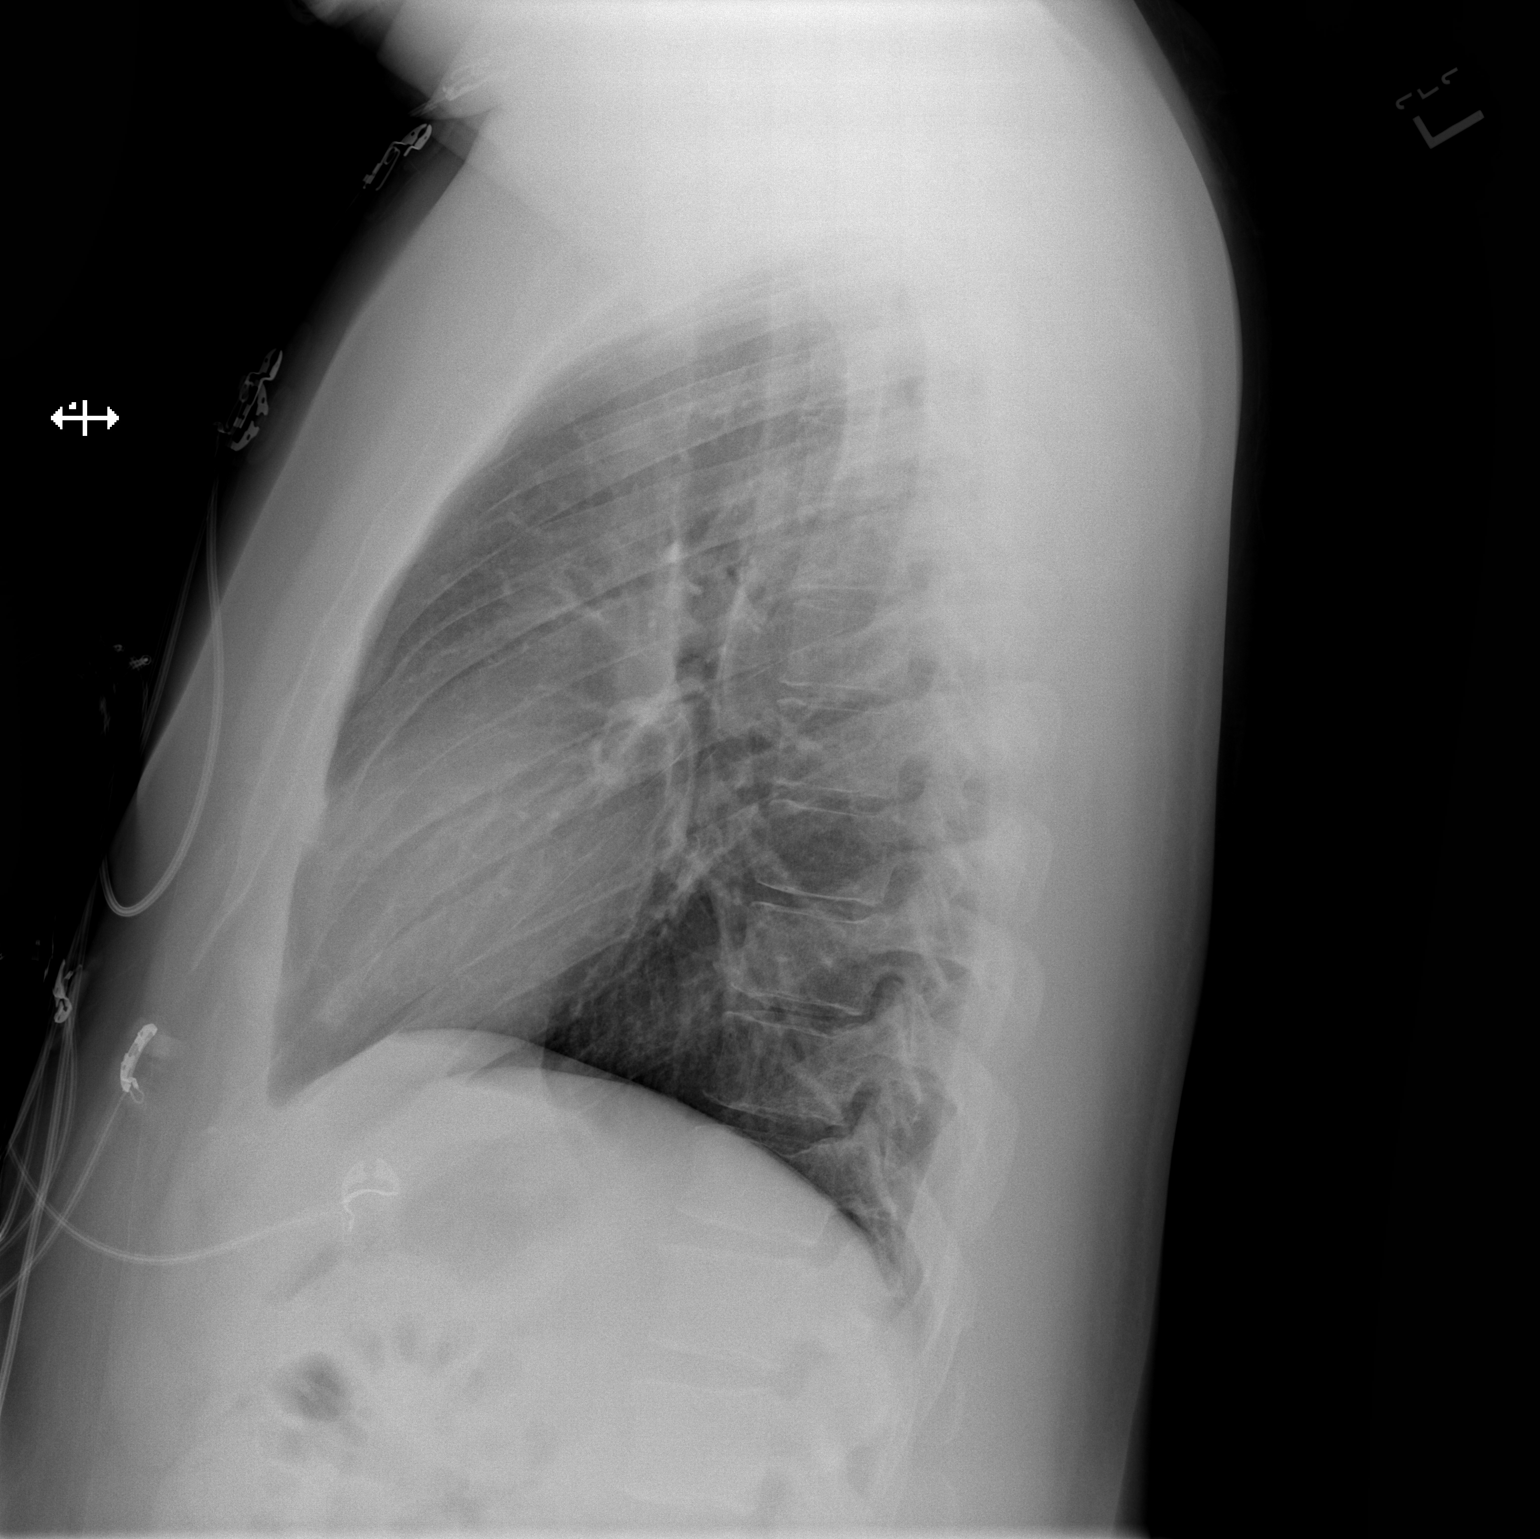

[2 of 2 positions shown; findings below may reference images not displayed]

FINDINGS: Cardiomediastinal silhouette unchanged in size and contour. No
evidence of central vascular congestion. No pneumothorax or pleural
effusion. No confluent airspace disease.

Nodule of the right upper lung again noted, relatively unchanged.

No displaced fracture.
IMPRESSION: Negative for acute cardiopulmonary disease.

Redemonstration nodule right upper lobe. As previously noted, a
follow-up chest x-ray in 1-2 months may be considered, or
alternatively chest CT.

## 2018-05-01 MED ORDER — SODIUM CHLORIDE 0.9% FLUSH: 3.0000 mL | Freq: Once | INTRAVENOUS | Status: DC

## 2018-05-01 NOTE — ED Triage Notes (Signed)
Pt states after receiving bad news today he had sharp pain mid chest and left arm tingling. At present pain not as strong tingling more in shoulder than arm.

## 2018-05-01 NOTE — Discharge Instructions (Addendum)
Your work up today has been reassuring, your kidney function was a little bit up so make sure you're staying well hydrated. It's likely that your stress and anxiety are playing a part into why you're having recurrent chest pain. Stay well hydrated, get plenty of rest. Try to stay calm, practice deep breathing exercises when you feel anxious/nervous. Alternate between tylenol and motrin as needed for pain. You have a lung nodule on your xray which just needs to be followed up on when you establish medical care with a primary care doctor.   Your blood pressure was elevated today. Eat a low salt/low sodium diet, and take all of your home blood pressure medications as directed. Keep a log of your blood pressure readings from every morning and evening (making sure to give yourself at least 15 minutes of rest prior to checking it) and take it to your doctor's office at your next appointment for ongoing management of your blood pressure. Stay well hydrated and get plenty of rest. Avoid caffeine and other over the counter products that would make your blood pressure go up (such as decongestants, excedrin, etc).     Follow up with the [facility] in 1 week for recheck of symptoms and to establish medical care.

## 2018-05-01 NOTE — ED Provider Notes (Signed)
[facility] DEPT Provider Note   CSN: [id number] Arrival date & time: 05/01/18  [place]    History   Chief Complaint Chief Complaint  Patient presents with   Chest Pain    HPI    William Ali is a 33 y.o. male with a PMHx of back pain and HTN, who presents to the ED with complaints of transient CP that occurred at 4:54pm when he got some bad news via text.  Patient states that when he received the bad news, he had a sharp pain in the left side of his chest that lasted a few minutes before self resolving.  He describes his pain as a 7/10 sharp pain that radiated to the left shoulder/trapezius area before resolving, worsened with "thinking about negative things", unchanged with exertion or inspiration, and relieved with "talking to his wife", no other treatments tried prior to arrival.  He states that currently the pain has gone away.  He reports that he had some left arm tingling that lasted about 10 to 15 minutes but then resolved as well.  He endorses having a "nervous feeling" in his stomach and admits that he has been under a lot of stress recently.  He is not currently have a PCP.  He is a non-smoker.  No known family history of cardiac disease.  He denies any lightheadedness, diaphoresis, fevers, chills, new or worsening cough, shortness of breath, leg swelling, recent travel/surgery/immobilization, personal or family history of DVT/PE, abdominal pain, nausea, vomiting, diarrhea, constipation, dysuria, hematuria, arthralgias, numbness, focal weakness, claudication, orthopnea, or any other complaints at this time.  Of note, pt has been seen 3 times for CP in the last 2 weeks, all with reassuring evaluations/work ups (CXR on 04/24/18 showed RUL nodule and BMP showed mildly elevated BUN/Cr (22/1.44 respectively) but otherwise his work up was fairly reassuring including negative D-dimer). He states he's been having some chest pain for "a few weeks", which is why he's  been seen 3 times for it recently. At his visit to [facility] on 04/21/18 they started him on Norvasc which he has been compliant with.   The history is provided by the patient and medical records. No language interpreter was used.  Chest Pain  Associated symptoms: no abdominal pain, no cough, no diaphoresis, no fever, no nausea, no numbness, no shortness of breath, no vomiting and no weakness     Past Medical History:  Diagnosis Date   Back pain    Hypertension     There are no active problems to display for this patient.   Past Surgical History:  Procedure Laterality Date   KNEE SURGERY          Home Medications    Prior to Admission medications   Medication Sig Start Date End Date Taking? Authorizing Provider  amLODipine (NORVASC) 5 MG tablet Take 1 tablet (5 mg total) by mouth daily. 04/21/18   Robyn Haber, MD  famotidine (PEPCID) 20 MG tablet Take 1 tablet (20 mg total) by mouth 2 (two) times daily. 04/21/18   Robyn Haber, MD  predniSONE (DELTASONE) 20 MG tablet Take 3 PO QAM x3days, 2 PO QAM x3days, 1 PO QAM x3days 04/22/18   Scot Jun, FNP  traMADol (ULTRAM) 50 MG tablet Take 1 tablet (50 mg total) by mouth every 6 (six) hours as needed. 04/22/18   Scot Jun, FNP    Family History Family History  Problem Relation Age of Onset   Hypertension Mother  Diabetes Father     Social History Social History   Tobacco Use   Smoking status: Never Smoker   Smokeless tobacco: Never Used  Substance Use Topics   Alcohol use: No   Drug use: No     Allergies   Onion; Other; and Peanut-containing drug products   Review of Systems Review of Systems  Constitutional: Negative for chills, diaphoresis and fever.  Respiratory: Negative for cough and shortness of breath.   Cardiovascular: Positive for chest pain. Negative for leg swelling.  Gastrointestinal: Negative for abdominal pain, constipation, diarrhea, nausea and vomiting.  Genitourinary:  Negative for dysuria and hematuria.  Musculoskeletal: Positive for myalgias. Negative for arthralgias.  Skin: Negative for color change.  Allergic/Immunologic: Negative for immunocompromised state.  Neurological: Negative for weakness, light-headedness and numbness.  Psychiatric/Behavioral: Negative for confusion.   All other systems reviewed and are negative for acute change except as noted in the HPI.    Physical Exam Updated Vital Signs BP (!) 157/94 (BP Location: Right Arm)    Pulse 90    Temp 98.3 F (36.8 C) (Oral)    Resp 16    Ht 5\' 7"  (1.702 m)    Wt 106.6 kg    SpO2 100%    BMI 36.81 kg/m   Physical Exam Vitals signs and nursing note reviewed.  Constitutional:      General: He is not in acute distress.    Appearance: Normal appearance. He is well-developed. He is not toxic-appearing.     Comments: Afebrile, nontoxic, NAD, HTN noted similar to prior visits  HENT:     Head: Normocephalic and atraumatic.  Eyes:     General:        Right eye: No discharge.        Left eye: No discharge.     Conjunctiva/sclera: Conjunctivae normal.  Neck:     Musculoskeletal: Normal range of motion and neck supple.  Cardiovascular:     Rate and Rhythm: Normal rate and regular rhythm.     Pulses: Normal pulses.     Heart sounds: Normal heart sounds, S1 normal and S2 normal. No murmur. No friction rub. No gallop.      Comments: RRR, nl s1/s2, no m/r/g, distal pulses intact, no pedal edema  Pulmonary:     Effort: Pulmonary effort is normal. No respiratory distress.     Breath sounds: Normal breath sounds. No decreased breath sounds, wheezing, rhonchi or rales.     Comments: CTAB in all lung fields, no w/r/r, no hypoxia or increased WOB, speaking in full sentences, SpO2 100% on RA  Chest:     Chest wall: No deformity, tenderness or crepitus.     Comments: Chest wall nonTTP without crepitus, deformities, or retractions  Abdominal:     General: Bowel sounds are normal. There is no  distension.     Palpations: Abdomen is soft. Abdomen is not rigid.     Tenderness: There is no abdominal tenderness. There is no right CVA tenderness, left CVA tenderness, guarding or rebound. Negative signs include Murphy's sign and McBurney's sign.  Musculoskeletal: Normal range of motion.  Skin:    General: Skin is warm and dry.     Findings: No rash.  Neurological:     Mental Status: He is alert and oriented to person, place, and time.     Sensory: Sensation is intact. No sensory deficit.     Motor: Motor function is intact.  Psychiatric:        Mood  and Affect: Affect normal. Mood is anxious.        Behavior: Behavior normal.     Comments: Mildly anxious appearing      ED Treatments / Results  Labs (all labs ordered are listed, but only abnormal results are displayed) Labs Reviewed  BASIC METABOLIC PANEL - Abnormal; Notable for the following components:      Result Value   Creatinine, Ser 1.42 (*)    All other components within normal limits  CBC - Abnormal; Notable for the following components:   WBC 11.2 (*)    Platelets 410 (*)    All other components within normal limits  TROPONIN I    EKG EKG Interpretation  Date/Time:  Sunday May 01 2018 18:48:17 EDT Ventricular Rate:  94 PR Interval:    QRS Duration: 81 QT Interval:  315 QTC Calculation: 394 R Axis:   23 Text Interpretation:  Sinus rhythm Probable left atrial enlargement Borderline T wave abnormalities Minimal ST elevation, anterior leads since last tracing no significant change Confirmed by Belfi, Melanie ([id number]) on 05/01/2018 6:52:11 PM   Radiology Dg Chest 2 View  Result Date: 05/01/2018 CLINICAL DATA:  32 year old male with a history of chest tightness EXAM: CHEST - 2 VIEW COMPARISON:  04/24/2018 FINDINGS: Cardiomediastinal silhouette unchanged in size and contour. No evidence of central vascular congestion. No pneumothorax or pleural effusion. No confluent airspace disease. Nodule of the right upper  lung again noted, relatively unchanged. No displaced fracture. IMPRESSION: Negative for acute cardiopulmonary disease. Redemonstration nodule right upper lobe. As previously noted, a follow-up chest x-ray in 1-2 months may be considered, or alternatively chest CT. Electronically Signed   By: Jaime  Wagner D.O.   On: 05/01/2018 19:22    Procedures Procedures (including critical care time)  Medications Ordered in ED Medications - No data to display   Initial Impression / Assessment and Plan / ED Course  I have reviewed the triage vital signs and the nursing notes.  Pertinent labs & imaging results that were available during my care of the patient were reviewed by me and considered in my medical decision making (see chart for details).        32  y.o. male here with chest pain that began after he got some bad news, lasted a few minutes then resolved.  Currently chest pain-free.  He had some left arm tingling for a few minutes but that resolved.  He states he has a nervous feeling in his stomach.  He has been seen 4 times in the last 2 weeks for chest pain complaints, has had reassuring work-ups multiple times (chest x-ray at his last visit on 04/24/18 showed RUL nodule).  He admits that he has been under a lot of stress recently.  On exam, appears mildly anxious, no chest wall tenderness, heart and lung sounds clear, no tachycardia or hypoxia, no pedal edema, VSS. EKG without acute ischemic changes. CXR without acute findings, again demonstrates RUL nodule which needs to be followed up. Doubt need for emergent chest CT at this time. Doubt PE, dissection, etc. Doubt ACS. Likely anxiety related, given that he got bad news and then developed chest pain. Will get labs and reassess. Pt declines wanting anything for symptoms currently since his symptoms have resolved. Doubt need for ASA or NTG at this time. Will reassess after labs return. Discussed case with my attending Dr. Ashok Cordia who agrees with plan.    8:13 PM CBC with marginally elevated WBC 11.2 and plt 410 but otherwise  WNL. BMP with Cr 1.42 similar to recent visit (BUN better), otherwise WNL. Trop neg. Pt continues to be pain/symptom free.  Suspect large component of anxiety as to why he's having recurrent chest pain episodes. Doubt ACS or other emergent pathology, doubt need for further emergent work up or need for second troponin. Advised deep breathing exercises and calming techniques to help with anxiety. Other OTC remedies for symptomatic relief advised. F/up with [facility] in 1 week for recheck and to establish medical care and talk about perhaps starting something for anxiety. Also to f/up on lung nodule that is seen on CXR today. For his BP, DASH diet advised, discussed use of home BP meds, keep a log of BPs at home, and avoid things that would make BP go up, and f/up with regular doctor for management of BPs. I explained the diagnosis and have given explicit precautions to return to the ER including for any other new or worsening symptoms. The patient understands and accepts the medical plan as it's been dictated and I have answered their questions. Discharge instructions concerning home care and prescriptions have been given. The patient is STABLE and is discharged to home in good condition.    Final Clinical Impressions(s) / ED Diagnoses   Final diagnoses:  Left-sided chest pain  Anxiety  Essential hypertension  Lung nodule  Elevated serum creatinine    ED Discharge Orders    [address] [place], [place] 05/01/18 2014    Lajean Saver, MD 05/01/18 2344

## 2018-05-28 ENCOUNTER — Other Ambulatory Visit: Payer: Self-pay

## 2018-05-28 ENCOUNTER — Encounter (HOSPITAL_COMMUNITY): Payer: Self-pay | Admitting: Emergency Medicine

## 2018-05-28 ENCOUNTER — Ambulatory Visit (HOSPITAL_COMMUNITY)
Admission: EM | Admit: 2018-05-28 | Discharge: 2018-05-28 | Disposition: A | Discharge status: Home / Self Care | Payer: Self-pay | Attending: Family Medicine | Admitting: Family Medicine

## 2018-05-28 DIAGNOSIS — G4452 New daily persistent headache (NDPH): Secondary | ICD-10-CM

## 2018-05-28 DIAGNOSIS — I10 Essential (primary) hypertension: Secondary | ICD-10-CM

## 2018-05-28 MED ORDER — BUTALBITAL-APAP-CAFFEINE 50-325-40 MG PO TABS: 1.0000 | ORAL_TABLET | Freq: Four times a day (QID) | ORAL | 0 refills | Status: DC | PRN

## 2018-05-28 NOTE — Discharge Instructions (Signed)
Schedule an eye exam as soon as possible. [facility] are open and seeing patients. Recommend obtaining a Large blood pressure cuff in order to obtain more accurate blood pressure readings. I have sent medication to pharmacy for your headaches to take as needed.

## 2018-05-28 NOTE — ED Triage Notes (Addendum)
Headaches for 1 1/2 weeks.   No runny nose, no cough.  No fever.   Patient has had intermittent epigastric pain for 2 weeks.  Acid reflux medication seems to help  175/101 was blood pressure reading at home

## 2018-05-28 NOTE — ED Provider Notes (Addendum)
[facility]    CSN: [id number] Arrival date & time: 05/28/18  1414     History   Chief Complaint Chief Complaint  Patient presents with  . Headache    HPI William Ali is a 33 y.o. male.   HPI Presents with a complaint of a headache for 1.5 weeks. Patient has a history of uncontrolled blood pressure and has not followed up with a PCP for further evaluation. On arrival, blood pressure is stable, however he reports a reading today of 175/101. Headache is primarily located in back of his head when present. He also endorses eye fatigue and pressure. He had an eye exam at some point and glasses were recommended. He endorses poor visual acuity in left eye. Today he felt the headache move to the temporal region and grew concern when BP was elevated. He is concern that his home BP cuff is inaccurate. The cuff affixed to his home BP machine is signifciantly smaller than cuff here at urgent care. He was unaware that improperly fitting  cuff can produce incorrect lab values. He denies dizziness, chest pain, or weakness. He occasionally endorses epigastric pain is currently taking medication for acid reflux symptoms. He has not followed up with PCP as he is having problems with his new insurance carrier, although intends to follow-up. Past Medical History:  Diagnosis Date  . Back pain   . Hypertension     There are no active problems to display for this patient.   Past Surgical History:  Procedure Laterality Date  . KNEE SURGERY         Home Medications    Prior to Admission medications   Medication Sig Start Date End Date Taking? Authorizing Provider  amLODipine (NORVASC) 5 MG tablet Take 1 tablet (5 mg total) by mouth daily. 04/21/18  Yes Robyn Haber, MD  acetaminophen (TYLENOL) 500 MG tablet Take 500 mg by mouth every 6 (six) hours as needed for moderate pain.    [provider]  famotidine (PEPCID) 20 MG tablet Take 1 tablet (20 mg total) by mouth 2 (two)  times daily. Patient taking differently: Take 20 mg by mouth 2 (two) times daily as needed for heartburn.  04/21/18   Robyn Haber, MD  multivitamin (ONE-A-DAY MEN'S) TABS tablet Take 1 tablet by mouth daily.    [provider]  predniSONE (DELTASONE) 20 MG tablet Take 3 PO QAM x3days, 2 PO QAM x3days, 1 PO QAM x3days Patient not taking: Reported on 05/01/2018 04/22/18   Scot Jun, FNP  traMADol (ULTRAM) 50 MG tablet Take 1 tablet (50 mg total) by mouth every 6 (six) hours as needed. Patient taking differently: Take 50 mg by mouth every 6 (six) hours as needed for moderate pain.  04/22/18   Scot Jun, FNP    Family History Family History  Problem Relation Age of Onset  . Hypertension Mother   . Diabetes Father     Social History Social History   Tobacco Use  . Smoking status: Never Smoker  . Smokeless tobacco: Never Used  Substance Use Topics  . Alcohol use: No  . Drug use: No     Allergies   Onion; Other; and Peanut-containing drug products   Review of Systems Review of Systems Pertinent negatives listed in HPI Physical Exam Triage Vital Signs ED Triage Vitals  Enc Vitals Group     BP 05/28/18 1445 (!) 148/85     Pulse Rate 05/28/18 1445 95  Resp 05/28/18 1445 16     Temp 05/28/18 1445 98.2 F (36.8 C)     Temp Source 05/28/18 1445 Oral     SpO2 05/28/18 1445 100 %     Weight --      Height --      Head Circumference --      Peak Flow --      Pain Score 05/28/18 1443 4     Pain Loc --      Pain Edu? --      Excl. in [place]? --    No data found.  Updated Vital Signs BP (!) 148/85 (BP Location: Left Arm)   Pulse 95   Temp 98.2 F (36.8 C) (Oral)   Resp 16   SpO2 100%   Visual Acuity Right Eye Distance:   Left Eye Distance:   Bilateral Distance:    Right Eye Near:   Left Eye Near:    Bilateral Near:     Physical Exam General appearance: alert, well developed, well nourished, cooperative and in no distress Head:  Normocephalic, without obvious abnormality, atraumatic Respiratory: Respirations even and unlabored, normal respiratory rate Heart: rate and rhythm normal. No gallop or murmurs noted on exam  Abdomen: BS +, no distention, no rebound tenderness, or no mass Extremities: No gross deformities Skin: Skin color, texture, turgor normal. No rashes seen  Psych: Appropriate mood and affect. Neurologic: Mental status: Alert, oriented to person, place, and time, thought content appropriate. UC Treatments / Results  Labs (all labs ordered are listed, but only abnormal results are displayed) Labs Reviewed - No data to display  EKG None  Radiology No results found.  Procedures Procedures (including critical care time)  Medications Ordered in UC Medications - No data to display  Initial Impression / Assessment and Plan / UC Course  I have reviewed the triage vital signs and the nursing notes.  Pertinent labs & imaging results that were available during my care of the patient were reviewed by me and considered in my medical decision making (see chart for details).   Advised to establish with a PCP in order to receive appropriate management and monitoring of blood pressure. Headache, I suspect is related to poor vision, as corrective lenses were recommended previously. Fiorect given PRN for headache management. Continue Pepcid for acid reflux symptoms. Recommended obtaining a large adult cuff to more accurately monitor BP at home.  Final Clinical Impressions(s) / UC Diagnoses   Final diagnoses:  New daily persistent headache  Essential hypertension     Discharge Instructions     Schedule an eye exam as soon as possible. [facility] open and seeing patients. Recommend obtaining a Large blood pressure cuff in order to obtain more accurate blood pressure readings. I have sent medication to pharmacy for your headaches to take as needed.    ED Prescriptions    Medication  Sig Dispense Auth. Provider   butalbital-acetaminophen-caffeine (FIORICET) 50-325-40 MG tablet Take 1 tablet by mouth every 6 (six) hours as needed for headache. 20 tablet Scot Jun, FNP     Controlled Substance Prescriptions [place] Controlled Substance Registry consulted? Not Applicable   Scot Jun, FNP 05/30/18 2007    Scot Jun, [place] 05/30/18 2013

## 2018-07-21 ENCOUNTER — Ambulatory Visit (HOSPITAL_COMMUNITY)
Admission: EM | Admit: 2018-07-21 | Discharge: 2018-07-21 | Disposition: A | Discharge status: Home / Self Care | Payer: BLUE CROSS/BLUE SHIELD | Attending: Internal Medicine | Admitting: Internal Medicine

## 2018-07-21 ENCOUNTER — Encounter (HOSPITAL_COMMUNITY): Payer: Self-pay

## 2018-07-21 ENCOUNTER — Other Ambulatory Visit: Payer: Self-pay

## 2018-07-21 DIAGNOSIS — I10 Essential (primary) hypertension: Secondary | ICD-10-CM

## 2018-07-21 DIAGNOSIS — R51 Headache: Secondary | ICD-10-CM

## 2018-07-21 DIAGNOSIS — R519 Headache, unspecified: Secondary | ICD-10-CM

## 2018-07-21 MED ORDER — HYDROCHLOROTHIAZIDE 25 MG PO TABS: 25.0000 mg | ORAL_TABLET | Freq: Every day | ORAL | 1 refills | Status: DC

## 2018-07-21 NOTE — ED Triage Notes (Signed)
Patient presents to Urgent Care with complaints of neck pain and anterior head pain since 2-3 weeks ago, intermittently depending on position. Patient reports the pain first started when he woke up from a nap, pt takes amlodipine and did not take it for the past 2 days and his headaches have improved, pt is wondering if he should d/c or continue the medication.

## 2018-07-21 NOTE — ED Provider Notes (Signed)
[facility]    CSN: [id number] Arrival date & time: 07/21/18  1309      History   Chief Complaint Chief Complaint  Patient presents with  . Neck Pain    HPI William Ali is a 33 y.o. male history of hypertension comes to urgent care with complaints of pain in the back of the neck and also frontal pain started about 2 weeks ago.  Patient associates the pain with the antihypertensive medication, amlodipine, that was prescribed to him about a month or so ago.  Patient denies any dizziness, oral, nausea or vomiting.  No numbness or tingling.  He gets a sensation of pounding of his heart.  But denies any precordial chest pain.  Patient feels that he is having a side effect of amlodipine.  No fever or chills, runny nose, sore throat.   Past Medical History:  Diagnosis Date  . Back pain   . Hypertension     There are no active problems to display for this patient.   Past Surgical History:  Procedure Laterality Date  . KNEE SURGERY         Home Medications    Prior to Admission medications   Medication Sig Start Date End Date Taking? Authorizing Provider  amLODipine (NORVASC) 5 MG tablet Take 1 tablet (5 mg total) by mouth daily. 04/21/18  Yes Robyn Haber, MD  acetaminophen (TYLENOL) 500 MG tablet Take 500 mg by mouth every 6 (six) hours as needed for moderate pain.    [provider]  butalbital-acetaminophen-caffeine (FIORICET) [phone] MG tablet Take 1 tablet by mouth every 6 (six) hours as needed for headache. 05/28/18 05/28/19  Scot Jun, FNP  famotidine (PEPCID) 20 MG tablet Take 1 tablet (20 mg total) by mouth 2 (two) times daily. Patient taking differently: Take 20 mg by mouth 2 (two) times daily as needed for heartburn.  04/21/18   Robyn Haber, MD  multivitamin (ONE-A-DAY MEN'S) TABS tablet Take 1 tablet by mouth daily.    [provider]  predniSONE (DELTASONE) 20 MG tablet Take 3 PO QAM x3days, 2 PO QAM x3days, 1 PO QAM  x3days Patient not taking: Reported on 05/01/2018 04/22/18   Scot Jun, FNP  traMADol (ULTRAM) 50 MG tablet Take 1 tablet (50 mg total) by mouth every 6 (six) hours as needed. Patient taking differently: Take 50 mg by mouth every 6 (six) hours as needed for moderate pain.  04/22/18   Scot Jun, FNP    Family History Family History  Problem Relation Age of Onset  . Hypertension Mother   . Diabetes Father     Social History Social History   Tobacco Use  . Smoking status: Never Smoker  . Smokeless tobacco: Never Used  Substance Use Topics  . Alcohol use: No  . Drug use: No     Allergies   Onion, Other, and Peanut-containing drug products   Review of Systems Review of Systems  Constitutional: Negative for activity change, appetite change, diaphoresis, fatigue and fever.  Respiratory: Negative.  Negative for chest tightness and shortness of breath.   Cardiovascular: Negative.   Gastrointestinal: Negative.  Negative for abdominal pain, diarrhea, nausea and vomiting.  Musculoskeletal: Negative.      Physical Exam Triage Vital Signs ED Triage Vitals  Enc Vitals Group     BP 07/21/18 1328 (!) 153/87     Pulse Rate 07/21/18 1328 85     Resp 07/21/18 1328 18     Temp  07/21/18 1328 98.3 F (36.8 C)     Temp Source 07/21/18 1328 Oral     SpO2 07/21/18 1328 100 %     Weight --      Height --      Head Circumference --      Peak Flow --      Pain Score 07/21/18 1326 4     Pain Loc --      Pain Edu? --      Excl. in [place]? --    No data found.  Updated Vital Signs BP (!) 153/87 (BP Location: Left Arm)   Pulse 85   Temp 98.3 F (36.8 C) (Oral)   Resp 18   SpO2 100%   Visual Acuity Right Eye Distance:   Left Eye Distance:   Bilateral Distance:    Right Eye Near:   Left Eye Near:    Bilateral Near:     Physical Exam Constitutional:      Appearance: Normal appearance. He is not ill-appearing or toxic-appearing.  HENT:     Mouth/Throat:      Mouth: Mucous membranes are moist.     Pharynx: No posterior oropharyngeal erythema.  Neck:     Musculoskeletal: Normal range of motion.  Cardiovascular:     Rate and Rhythm: Normal rate and regular rhythm.     Pulses: Normal pulses.     Heart sounds: Normal heart sounds.  Pulmonary:     Effort: Pulmonary effort is normal.     Breath sounds: Normal breath sounds.  Abdominal:     General: Bowel sounds are normal. There is no distension.     Palpations: Abdomen is soft.     Tenderness: There is no abdominal tenderness. There is no guarding.  Musculoskeletal: Normal range of motion.        General: No swelling, tenderness or deformity.  Skin:    General: Skin is warm.     Capillary Refill: Capillary refill takes less than 2 seconds.     Coloration: Skin is not jaundiced.     Findings: No bruising.  Neurological:     General: No focal deficit present.     Mental Status: He is alert and oriented to person, place, and time.     Cranial Nerves: No cranial nerve deficit.     Motor: No weakness.      UC Treatments / Results  Labs (all labs ordered are listed, but only abnormal results are displayed) Labs Reviewed - No data to display  EKG None  Radiology No results found.  Procedures Procedures (including critical care time)  Medications Ordered in UC Medications - No data to display  Initial Impression / Assessment and Plan / UC Course  I have reviewed the triage vital signs and the nursing notes.  Pertinent labs & imaging results that were available during my care of the patient were reviewed by me and considered in my medical decision making (see chart for details).     1.  Headache and neck pain, possibly side effect of amlodipine: Discontinue amlodipine since patient associates amlodipine with his symptoms We will start hydrochlorothiazide for blood pressure control If patient continues to have headache or develops any localizing neurologic symptoms, he needs to  come back to the emergency department or urgent care to be reevaluated  2.  Hypertension: Continue low-salt diet Increase exercise Hydrochlorothiazide. Final Clinical Impressions(s) / UC Diagnoses   Final diagnoses:  None   Discharge Instructions   None  ED Prescriptions    None     Controlled Substance Prescriptions [place] Controlled Substance Registry consulted? No   Chase Picket, MD 07/21/18 [phone]

## 2018-08-17 DIAGNOSIS — K219 Gastro-esophageal reflux disease without esophagitis: Secondary | ICD-10-CM | POA: Insufficient documentation

## 2018-08-17 DIAGNOSIS — I10 Essential (primary) hypertension: Secondary | ICD-10-CM | POA: Insufficient documentation

## 2018-11-23 ENCOUNTER — Other Ambulatory Visit: Payer: Self-pay

## 2018-11-23 ENCOUNTER — Encounter (HOSPITAL_COMMUNITY): Payer: Self-pay

## 2018-11-23 ENCOUNTER — Ambulatory Visit (HOSPITAL_COMMUNITY)
Admission: EM | Admit: 2018-11-23 | Discharge: 2018-11-23 | Disposition: A | Discharge status: Home / Self Care | Payer: BLUE CROSS/BLUE SHIELD | Attending: Family Medicine | Admitting: Family Medicine

## 2018-11-23 DIAGNOSIS — H9201 Otalgia, right ear: Secondary | ICD-10-CM

## 2018-11-23 MED ORDER — FLUTICASONE PROPIONATE 50 MCG/ACT NA SUSP: 1.0000 | Freq: Every day | NASAL | 0 refills | Status: DC

## 2018-11-23 MED ORDER — CETIRIZINE HCL 10 MG PO TABS: 10.0000 mg | ORAL_TABLET | Freq: Every day | ORAL | 0 refills | Status: DC

## 2018-11-23 NOTE — ED Triage Notes (Signed)
Patient presents to Urgent Care with complaints of ear fullness and some pain since this morning. Patient reports it has sounded like it "has water in it" for a few days but the pain began today.

## 2018-11-23 NOTE — Discharge Instructions (Signed)
I do not see any obvious infection to your hear, I think your symptoms are stemming from sinus congestion.  Use of nasal spray daily to help with symptoms as well as daily zyrtec.  Tylenol and/or ibuprofen as needed for pain.  Push fluids to ensure adequate hydration and keep secretions thin.  If symptoms worsen or do not improve in the next week to return to be seen or to follow up with your PCP.

## 2018-11-23 NOTE — ED Provider Notes (Signed)
[facility]    CSN: [id number] Arrival date & time: 11/23/18  1100      History   Chief Complaint Chief Complaint  Patient presents with  . Ear Pain    HPI William Ali is a 33 y.o. male.   William Ali presents with complaints of right ear throbbing and soreness which started two days ago. States like feels like water within the ear. No fevers. Has had nasal drainage. Had a negative covid test two days ago. Clears throat but no coughing. History of gerd with a scheduled endoscopy upcoming. No ear drainage. No change in hearing. No headache. No sore throat. Hasn't taken any medications for symptoms. History  Of htn.     ROS per HPI, negative if not otherwise mentioned.      Past Medical History:  Diagnosis Date  . Back pain   . Hypertension     There are no active problems to display for this patient.   Past Surgical History:  Procedure Laterality Date  . KNEE SURGERY         Home Medications    Prior to Admission medications   Medication Sig Start Date End Date Taking? Authorizing Provider  hydrochlorothiazide (HYDRODIURIL) 25 MG tablet Take 1 tablet (25 mg total) by mouth daily. 07/21/18  Yes Lamptey, Myrene Galas, MD  acetaminophen (TYLENOL) 500 MG tablet Take 500 mg by mouth every 6 (six) hours as needed for moderate pain.    [provider]  butalbital-acetaminophen-caffeine (FIORICET) [phone] MG tablet Take 1 tablet by mouth every 6 (six) hours as needed for headache. 05/28/18 05/28/19  Scot Jun, FNP  cetirizine (ZYRTEC) 10 MG tablet Take 1 tablet (10 mg total) by mouth daily. 11/23/18   Zigmund Gottron, NP  fluticasone (FLONASE) 50 MCG/ACT nasal spray Place 1 spray into both nostrils daily. 11/23/18   Zigmund Gottron, NP  multivitamin (ONE-A-DAY MEN'S) TABS tablet Take 1 tablet by mouth daily.    [provider]  amLODipine (NORVASC) 5 MG tablet Take 1 tablet (5 mg total) by mouth daily. 04/21/18 07/21/18   Robyn Haber, MD  famotidine (PEPCID) 20 MG tablet Take 1 tablet (20 mg total) by mouth 2 (two) times daily. Patient taking differently: Take 20 mg by mouth 2 (two) times daily as needed for heartburn.  04/21/18 07/21/18  Robyn Haber, MD    Family History Family History  Problem Relation Age of Onset  . Hypertension Mother   . Diabetes Father     Social History Social History   Tobacco Use  . Smoking status: Never Smoker  . Smokeless tobacco: Never Used  Substance Use Topics  . Alcohol use: No  . Drug use: No     Allergies   Onion, Other, and Peanut-containing drug products   Review of Systems Review of Systems   Physical Exam Triage Vital Signs ED Triage Vitals  Enc Vitals Group     BP 11/23/18 1135 (!) 142/83     Pulse Rate 11/23/18 1135 72     Resp 11/23/18 1135 16     Temp 11/23/18 1135 98.4 F (36.9 C)     Temp Source 11/23/18 1135 Oral     SpO2 11/23/18 1135 100 %     Weight --      Height --      Head Circumference --      Peak Flow --      Pain Score 11/23/18 1133 4  Pain Loc --      Pain Edu? --      Excl. in [place]? --    No data found.  Updated Vital Signs BP (!) 142/83 (BP Location: Right Arm)   Pulse 72   Temp 98.4 F (36.9 C) (Oral)   Resp 16   SpO2 100%    Physical Exam Constitutional:      Appearance: He is well-developed.  HENT:     Head: Normocephalic and atraumatic.     Right Ear: Tympanic membrane, ear canal and external ear normal.     Left Ear: Tympanic membrane, ear canal and external ear normal.  Cardiovascular:     Rate and Rhythm: Normal rate.  Pulmonary:     Effort: Pulmonary effort is normal.  Skin:    General: Skin is warm and dry.  Neurological:     Mental Status: He is alert and oriented to person, place, and time.      UC Treatments / Results  Labs (all labs ordered are listed, but only abnormal results are displayed) Labs Reviewed - No data to display  EKG   Radiology No results found.   Procedures Procedures (including critical care time)  Medications Ordered in UC Medications - No data to display  Initial Impression / Assessment and Plan / UC Course  I have reviewed the triage vital signs and the nursing notes.  Pertinent labs & imaging results that were available during my care of the patient were reviewed by me and considered in my medical decision making (see chart for details).     Non toxic. Benign physical exam. Suspect sensation related congestion, no AOM on exam. Will try zyrtec and flonase to see if symptoms improve. Return precautions provided. Patient verbalized understanding and agreeable to plan.    Final Clinical Impressions(s) / UC Diagnoses   Final diagnoses:  Otalgia of right ear     Discharge Instructions     I do not see any obvious infection to your hear, I think your symptoms are stemming from sinus congestion.  Use of nasal spray daily to help with symptoms as well as daily zyrtec.  Tylenol and/or ibuprofen as needed for pain.  Push fluids to ensure adequate hydration and keep secretions thin.  If symptoms worsen or do not improve in the next week to return to be seen or to follow up with your PCP.     ED Prescriptions    Medication Sig Dispense Auth. Provider   fluticasone (FLONASE) 50 MCG/ACT nasal spray Place 1 spray into both nostrils daily. 16 g Augusto Gamble B, NP   cetirizine (ZYRTEC) 10 MG tablet Take 1 tablet (10 mg total) by mouth daily. 30 tablet Zigmund Gottron, NP     PDMP not reviewed this encounter.   Zigmund Gottron, NP 11/23/18 1239

## 2019-02-07 ENCOUNTER — Ambulatory Visit (HOSPITAL_COMMUNITY)
Admission: EM | Admit: 2019-02-07 | Discharge: 2019-02-07 | Disposition: A | Discharge status: Home / Self Care | Payer: Medicaid Other | Attending: Family Medicine | Admitting: Family Medicine

## 2019-02-07 ENCOUNTER — Other Ambulatory Visit: Payer: Self-pay

## 2019-02-07 ENCOUNTER — Emergency Department (HOSPITAL_COMMUNITY)
Admission: EM | Admit: 2019-02-07 | Discharge: 2019-02-07 | Disposition: A | Discharge status: Home / Self Care | Payer: Self-pay | Attending: Emergency Medicine | Admitting: Emergency Medicine

## 2019-02-07 ENCOUNTER — Encounter (HOSPITAL_COMMUNITY): Payer: Self-pay

## 2019-02-07 DIAGNOSIS — Z5321 Procedure and treatment not carried out due to patient leaving prior to being seen by health care provider: Secondary | ICD-10-CM | POA: Insufficient documentation

## 2019-02-07 DIAGNOSIS — I10 Essential (primary) hypertension: Secondary | ICD-10-CM

## 2019-02-07 DIAGNOSIS — R519 Headache, unspecified: Secondary | ICD-10-CM | POA: Insufficient documentation

## 2019-02-07 DIAGNOSIS — M542 Cervicalgia: Secondary | ICD-10-CM

## 2019-02-07 MED ORDER — CYCLOBENZAPRINE HCL 10 MG PO TABS: ORAL_TABLET | ORAL | 0 refills | Status: DC

## 2019-02-07 MED ORDER — IBUPROFEN 800 MG PO TABS: 800.0000 mg | ORAL_TABLET | Freq: Three times a day (TID) | ORAL | 0 refills | Status: DC

## 2019-02-07 NOTE — ED Triage Notes (Signed)
Pt states for 1.5-2 weeks he has been having neck and head pain. Pt denies N/V/D. Pt states pain is more on the left side of his neck radiating up. Pt states he took ibuprofen, which allows him to sleep. Last meds at midnight.

## 2019-02-07 NOTE — ED Triage Notes (Signed)
Pt states he has left shoulder pain that radiates to his neck and he has started to have headaches.

## 2019-02-08 NOTE — ED Provider Notes (Signed)
[facility]   [id number] 02/07/19 Arrival Time: 3151  ASSESSMENT & PLAN:  1. Neck pain on left side   2. Elevated blood pressure reading with diagnosis of hypertension     No indication for neck imaging. No trauma/injury. Likely muscle spasm producing tension-like headache. Discussed.  Begin trial of: Meds ordered this encounter  Medications  . cyclobenzaprine (FLEXERIL) 10 MG tablet    Sig: Take 1 tablet by mouth 3 times daily as needed for muscle spasm. Warning: May cause drowsiness.    Dispense:  21 tablet    Refill:  0  . ibuprofen (ADVIL) 800 MG tablet    Sig: Take 1 tablet (800 mg total) by mouth 3 (three) times daily with meals.    Dispense:  21 tablet    Refill:  0    Recommend: Follow-up Information    [facility].   Why: If worsening or failing to improve as anticipated. Contact information: [place] [phone]          Encouraged follow up with PCP or establishment of care with a PCP. May return here to recheck BP at any time. .   Reviewed expectations re: course of current medical issues. Questions answered. Outlined signs and symptoms indicating need for more acute intervention. Patient verbalized understanding. After Visit Summary given.  SUBJECTIVE: History from: patient. William Ali is a 34 y.o. male who reports intermittent moderate pain of his left posterior neck that extends to occipital scalp. Reports associated dull headache at times. Not the worst headache of his life. No injury/trauma reported. Ambulatory without difficulty. No visual changes/n/v reported.  Symptoms have waxed and waned since beginning. Aggravating factors: certain movements. Alleviating factors: have not been identified. Associated symptoms: none reported. Extremity sensation changes or weakness: none. Self treatment: ibuprofen with temporary relief.  History of similar:  no.  Past Surgical History:  Procedure Laterality Date  . KNEE SURGERY      Increased blood pressure noted today. Reports that he has not been treated for hypertension in the past.  He reports no chest pain on exertion, no dyspnea on exertion, no swelling of ankles, no orthostatic dizziness or lightheadedness, no orthopnea or paroxysmal nocturnal dyspnea, no palpitations and no intermittent claudication symptoms.  ROS: As per HPI. All other systems negative.    OBJECTIVE:  Vitals:   02/07/19 1431 02/07/19 1434  BP: (!) 160/87   Pulse: 73   Resp: 18   Temp: 98.2 F (36.8 C)   TempSrc: Oral   SpO2: 99%   Weight:  104.3 kg    General appearance: alert; no distress HEENT: [place]; AT Neck: supple with FROM; is TTP over L trapezius extending to post L neck Resp: unlabored respirations Extremities: moves all extremities normally Skin: warm and dry; no visible rashes Neurologic: CN 2-12 grossly intact; gait normal; normal reflexes of bilateral UE; normal sensation of bilateral UE; normal strength of bilateral UE Psychological: alert and cooperative; normal mood and affect     Allergies  Allergen Reactions  . Onion Anaphylaxis  . Other     All sea food causes swelling and a rash.    . Peanut-Containing Drug Products Itching    Past Medical History:  Diagnosis Date  . Back pain   . Hypertension    Social History   Socioeconomic History  . Marital status: Married    Spouse name: Not on file  . Number of children: Not  on file  . Years of education: Not on file  . Highest education level: Not on file  Occupational History  . Not on file  Tobacco Use  . Smoking status: Never Smoker  . Smokeless tobacco: Never Used  Substance and Sexual Activity  . Alcohol use: No  . Drug use: No  . Sexual activity: Yes  Other Topics Concern  . Not on file  Social History Narrative  . Not on file   Social Determinants of Health   Financial Resource Strain:   . Difficulty of  Paying Living Expenses: Not on file  Food Insecurity:   . Worried About Charity fundraiser in the Last Year: Not on file  . Ran Out of Food in the Last Year: Not on file  Transportation Needs:   . Lack of Transportation (Medical): Not on file  . Lack of Transportation (Non-Medical): Not on file  Physical Activity:   . Days of Exercise per Week: Not on file  . Minutes of Exercise per Session: Not on file  Stress:   . Feeling of Stress : Not on file  Social Connections:   . Frequency of Communication with Friends and Family: Not on file  . Frequency of Social Gatherings with Friends and Family: Not on file  . Attends Religious Services: Not on file  . Active Member of Clubs or Organizations: Not on file  . Attends Archivist Meetings: Not on file  . Marital Status: Not on file   Family History  Problem Relation Age of Onset  . Hypertension Mother   . Diabetes Father    Past Surgical History:  Procedure Laterality Date  . KNEE SURGERY        Vanessa Kick, MD 02/08/19 [phone]

## 2019-02-17 ENCOUNTER — Emergency Department (HOSPITAL_COMMUNITY): Payer: Self-pay

## 2019-02-17 ENCOUNTER — Encounter (HOSPITAL_COMMUNITY): Payer: Self-pay | Admitting: *Deleted

## 2019-02-17 ENCOUNTER — Emergency Department (HOSPITAL_COMMUNITY)
Admission: EM | Admit: 2019-02-17 | Discharge: 2019-02-17 | Disposition: A | Discharge status: Home / Self Care | Payer: Self-pay | Attending: Emergency Medicine | Admitting: Emergency Medicine

## 2019-02-17 ENCOUNTER — Other Ambulatory Visit: Payer: Self-pay

## 2019-02-17 DIAGNOSIS — Z79899 Other long term (current) drug therapy: Secondary | ICD-10-CM | POA: Insufficient documentation

## 2019-02-17 DIAGNOSIS — M542 Cervicalgia: Secondary | ICD-10-CM | POA: Insufficient documentation

## 2019-02-17 DIAGNOSIS — I10 Essential (primary) hypertension: Secondary | ICD-10-CM | POA: Insufficient documentation

## 2019-02-17 DIAGNOSIS — R519 Headache, unspecified: Secondary | ICD-10-CM | POA: Insufficient documentation

## 2019-02-17 DIAGNOSIS — Z91010 Allergy to peanuts: Secondary | ICD-10-CM | POA: Insufficient documentation

## 2019-02-17 IMAGING — CT CT CERVICAL SPINE W/O CM
3 of 4 series · 12 of 33 positions shown, 14 images · non-contrast
Comparison: None.

CLINICAL DATA: Neck pain for 3 weeks. Headaches.

EXAM:
CT CERVICAL SPINE WITHOUT CONTRAST
TECHNIQUE: Multidetector CT imaging of the cervical spine was performed without
intravenous contrast. Multiplanar CT image reconstructions were also
generated.

[Series 7: orthogonal bone · axial · 0.23mm/px · z∈[-216,-99]mm · 4 of 93 slices shown, 5 images]
[im 16/93  soft-tissue]
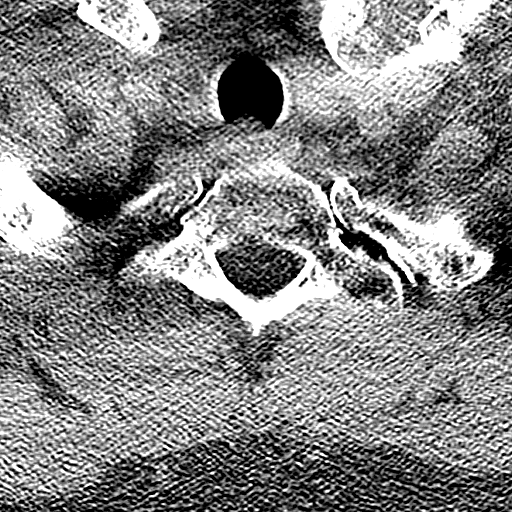
[im 16/93  bone]
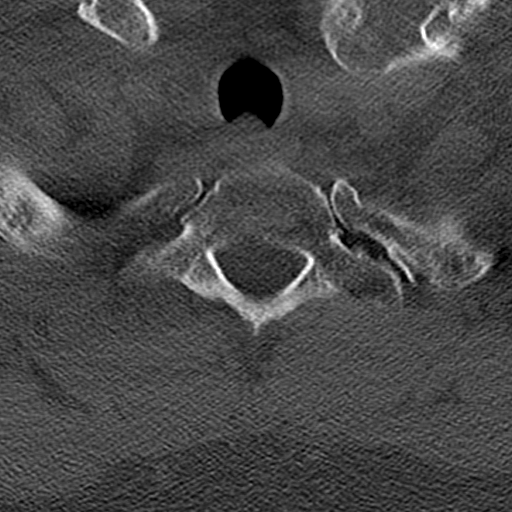
[im 31/93  bone]
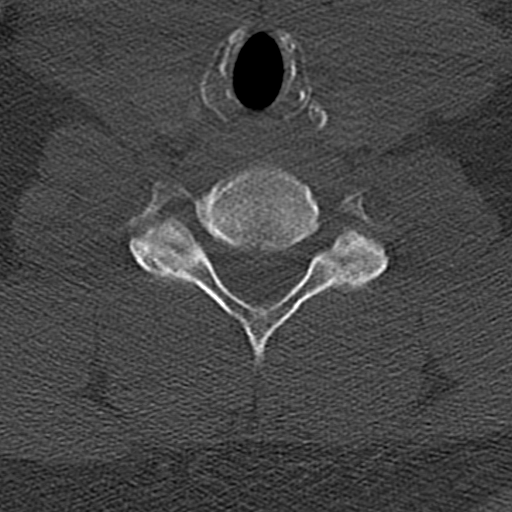
[im 62/93  bone]
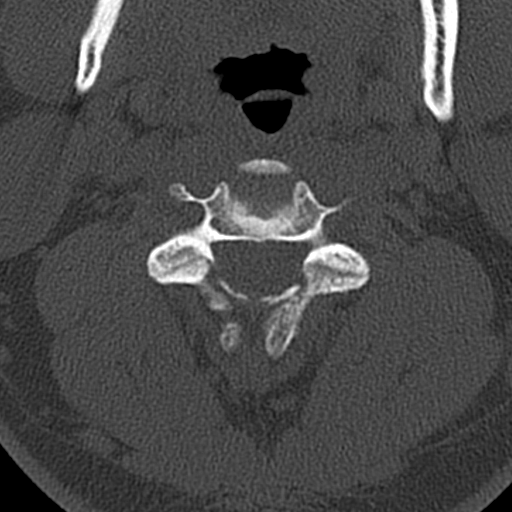
[im 77/93  bone]
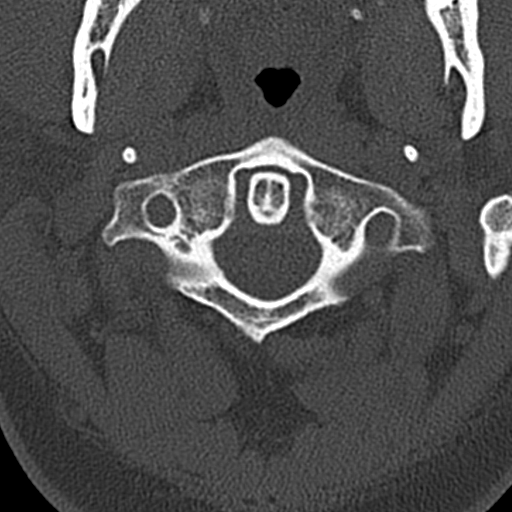

[Series 8: coronal bone · coronal · 0.25mm/px · 3 of 61 slices shown]
[im 13/61  bone]
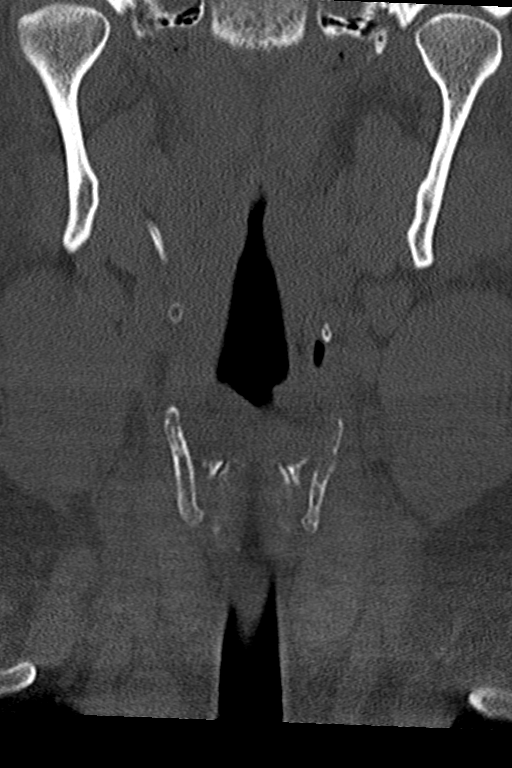
[im 25/61  bone]
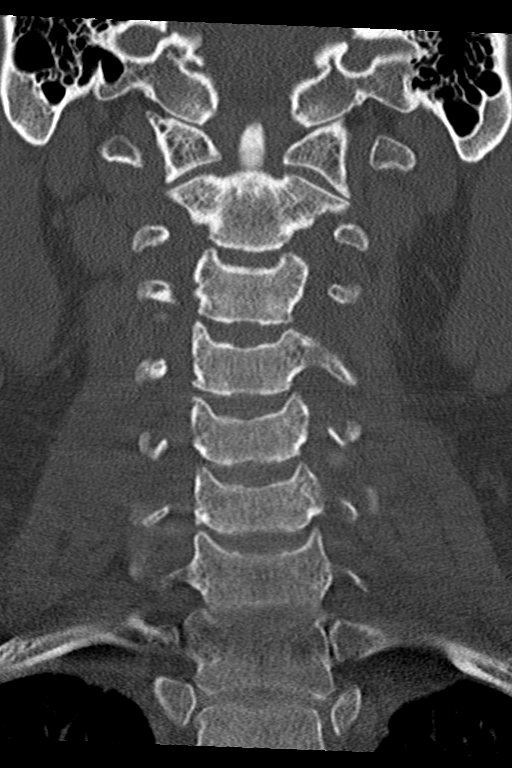
[im 37/61  bone]
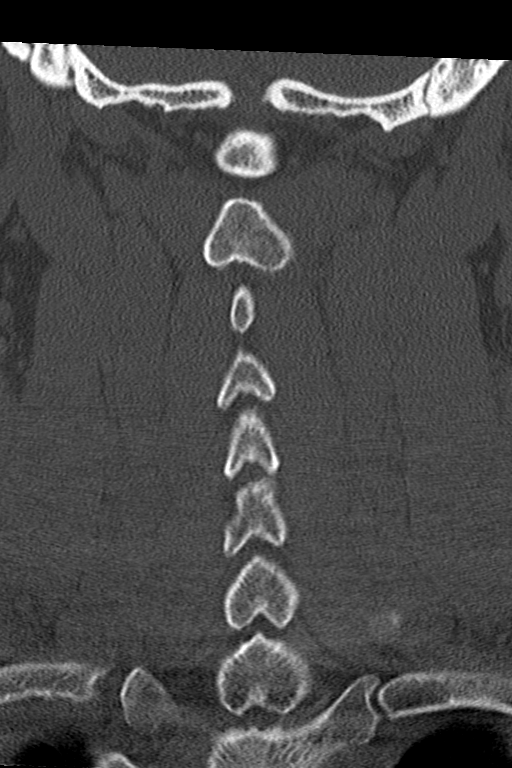

[Series 9: sagittal bone · sagittal · 0.25mm/px · 5 of 45 slices shown, 6 images]
[im 15/45  bone]
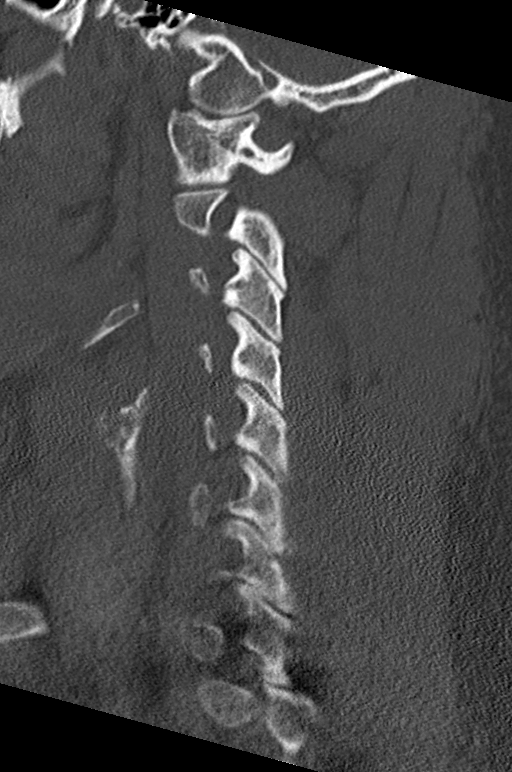
[im 19/45  bone]
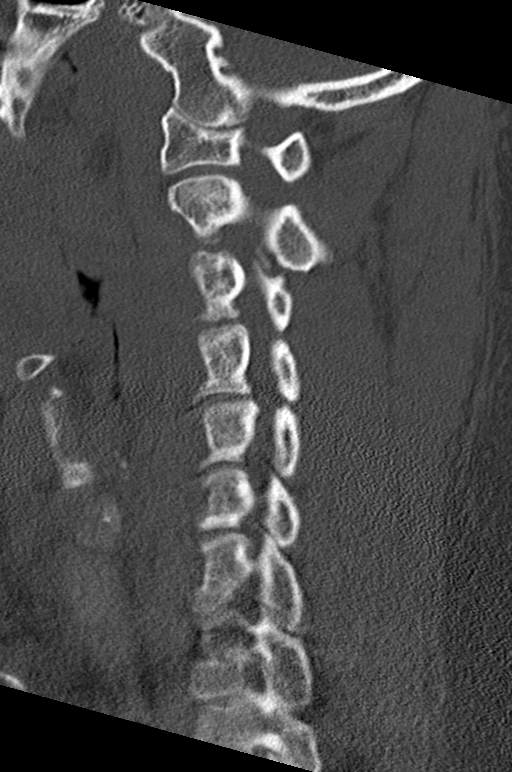
[im 23/45  soft-tissue]
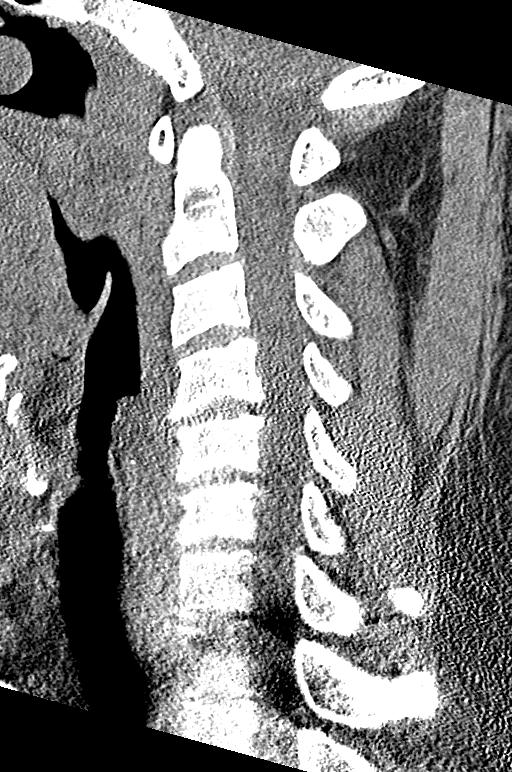
[im 23/45  bone]
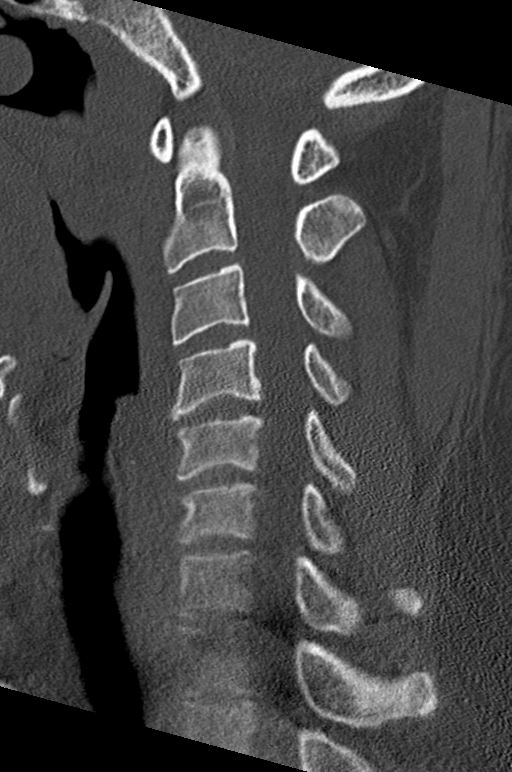
[im 26/45  bone]
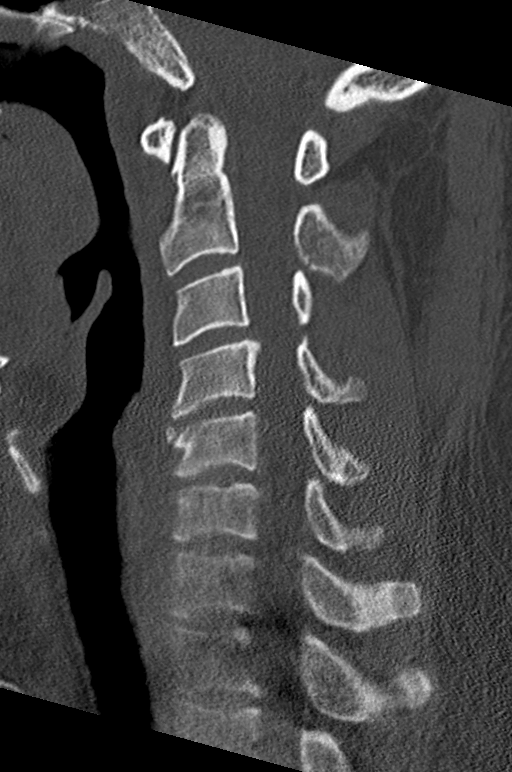
[im 30/45  bone]
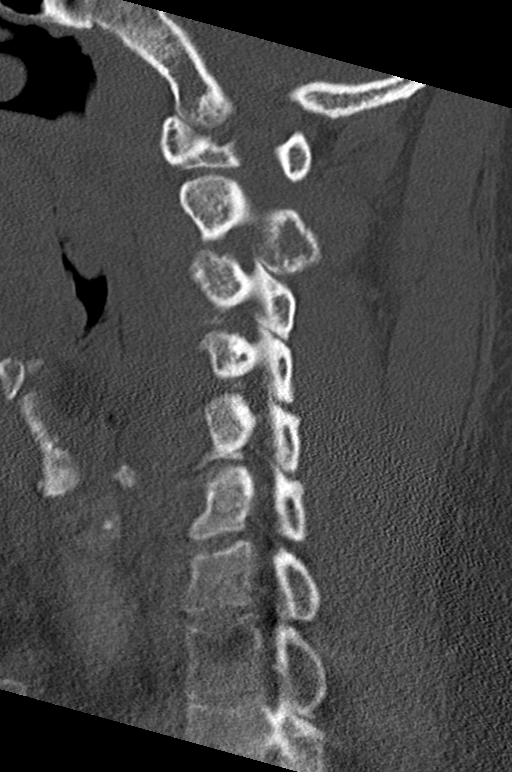

[12 of 33 positions shown; findings below may reference images not displayed]

FINDINGS: Alignment: Normal with straightening of lordosis noted.

Skull base and vertebrae: No acute fracture. No primary bone lesion
or focal pathologic process.

Soft tissues and spinal canal: No prevertebral fluid or swelling. No
visible canal hematoma.

Disc levels: Intervertebral disc space height is maintained. Mild
endplate spurring is seen at C3-4 and C4-5. Scattered facet
degenerative disease is most notable on the left at C5-6 and C6-7.

Upper chest: Negative.

Other: None.
IMPRESSION: 1. No acute abnormality or finding to explain the patient's
symptoms.
2. Cervical degenerative change as described above.

## 2019-02-17 MED ORDER — MELOXICAM 15 MG PO TABS: 15.0000 mg | ORAL_TABLET | Freq: Every day | ORAL | 0 refills | Status: DC

## 2019-02-17 NOTE — Discharge Instructions (Addendum)
Take Meloxicam once daily for pain. Take with food. Do not take Ibuprofen or Aleve with this medicine but you can take Tylenol if needed. Please follow up with sports medicine

## 2019-02-17 NOTE — ED Triage Notes (Signed)
  States for about 3 weeks neck pain, with movement he has bad headaches, happening more frequent, no noted injury to neck

## 2019-02-17 NOTE — ED Provider Notes (Signed)
[facility] DEPT Provider Note   CSN: [id number] Arrival date & time: 02/17/19  1047     History Chief Complaint  Patient presents with   Neck Pain   Headache    William Ali is a 34 y.o. male who presents with neck pain and headache. He states this problem has been bothering him for 2.5 weeks. The pain is over the back of his neck and radiates to his head and down his back at times. The pain is intermittent and worse with movement. It will cause a headache with neck extension and will radiate the bilateral temporal area. With neck flexion the pain radiates down the back and feels like a pulling. He went to [facility] and was prescribed Flexeril and Ibuprofen. He states the flexeril makes him drowsy and didn't help and the Ibuprofen helps but he has been taking it too regularly. He denies fever, chills, persistent dizziness or vision changes, arm or leg weakness, paresthesias. He denies any recent trauma. He is not working and denies heavy lifting. He got tested for COVID last week and it was negative.   HPI   Past Medical History:  Diagnosis Date   Back pain    Hypertension     There are no problems to display for this patient.   Past Surgical History:  Procedure Laterality Date   KNEE SURGERY         Family History  Problem Relation Age of Onset   Hypertension Mother    Diabetes Father     Social History   Tobacco Use   Smoking status: Never Smoker   Smokeless tobacco: Never Used  Substance Use Topics   Alcohol use: No   Drug use: No    Home Medications Prior to Admission medications   Medication Sig Start Date End Date Taking? Authorizing Provider  acetaminophen (TYLENOL) 500 MG tablet Take 500 mg by mouth every 6 (six) hours as needed for moderate pain.    [provider]  butalbital-acetaminophen-caffeine (FIORICET) [phone] MG tablet Take 1 tablet by mouth every 6 (six) hours as needed for headache. 05/28/18  05/28/19  Scot Jun, FNP  cetirizine (ZYRTEC) 10 MG tablet Take 1 tablet (10 mg total) by mouth daily. 11/23/18   Zigmund Gottron, NP  cyclobenzaprine (FLEXERIL) 10 MG tablet Take 1 tablet by mouth 3 times daily as needed for muscle spasm. Warning: May cause drowsiness. 02/07/19   Vanessa Kick, MD  fluticasone (FLONASE) 50 MCG/ACT nasal spray Place 1 spray into both nostrils daily. 11/23/18   Zigmund Gottron, NP  hydrochlorothiazide (HYDRODIURIL) 25 MG tablet Take 1 tablet (25 mg total) by mouth daily. 07/21/18   Chase Picket, MD  ibuprofen (ADVIL) 800 MG tablet Take 1 tablet (800 mg total) by mouth 3 (three) times daily with meals. 02/07/19   Vanessa Kick, MD  multivitamin (ONE-A-DAY MEN'S) TABS tablet Take 1 tablet by mouth daily.    [provider]  amLODipine (NORVASC) 5 MG tablet Take 1 tablet (5 mg total) by mouth daily. 04/21/18 07/21/18  Robyn Haber, MD  famotidine (PEPCID) 20 MG tablet Take 1 tablet (20 mg total) by mouth 2 (two) times daily. Patient taking differently: Take 20 mg by mouth 2 (two) times daily as needed for heartburn.  04/21/18 07/21/18  Robyn Haber, MD    Allergies    Onion, Other, and Peanut-containing drug products  Review of Systems   Review of Systems  Constitutional: Negative for chills and fever.  Eyes: Negative for visual disturbance.  Musculoskeletal: Positive for neck pain. Negative for back pain.  Neurological: Positive for headaches. Negative for dizziness, syncope, weakness and numbness.  All other systems reviewed and are negative.   Physical Exam Updated Vital Signs BP (!) 153/84 (BP Location: Left Arm)    Pulse 71    Temp 97.7 F (36.5 C) (Oral)    Resp 18    Wt 104.4 kg    SpO2 100%    BMI 36.04 kg/m   Physical Exam Vitals and nursing note reviewed.  Constitutional:      General: He is not in acute distress.    Appearance: He is well-developed. He is not ill-appearing.     Comments: Well appearing male in NAD   HENT:     Head: Normocephalic and atraumatic.  Eyes:     General: No scleral icterus.       Right eye: No discharge.        Left eye: No discharge.     Conjunctiva/sclera: Conjunctivae normal.     Pupils: Pupils are equal, round, and reactive to light.  Neck:     Comments: No significant tenderness of the neck of back of head. No neck stiffness or rigidity Cardiovascular:     Rate and Rhythm: Normal rate.  Pulmonary:     Effort: Pulmonary effort is normal. No respiratory distress.  Abdominal:     General: There is no distension.  Musculoskeletal:     Cervical back: Normal range of motion.  Skin:    General: Skin is warm and dry.  Neurological:     Mental Status: He is alert and oriented to person, place, and time.     Comments: Lying on stretcher in NAD. GCS 15. Speaks in a clear voice. Cranial nerves II through XII grossly intact. 5/5 strength in all extremities. Sensation fully intact.  Bilateral finger-to-nose intact. Ambulatory    Psychiatric:        Behavior: Behavior normal.     ED Results / Procedures / Treatments   Labs (all labs ordered are listed, but only abnormal results are displayed) Labs Reviewed - No data to display  EKG None  Radiology CT Cervical Spine Wo Contrast  Result Date: 02/17/2019 CLINICAL DATA:  Neck pain for 3 weeks. Headaches. EXAM: CT CERVICAL SPINE WITHOUT CONTRAST TECHNIQUE: Multidetector CT imaging of the cervical spine was performed without intravenous contrast. Multiplanar CT image reconstructions were also generated. COMPARISON:  None. FINDINGS: Alignment: Normal with straightening of lordosis noted. Skull base and vertebrae: No acute fracture. No primary bone lesion or focal pathologic process. Soft tissues and spinal canal: No prevertebral fluid or swelling. No visible canal hematoma. Disc levels: Intervertebral disc space height is maintained. Mild endplate spurring is seen at C3-4 and C4-5. Scattered facet degenerative disease is most  notable on the left at C5-6 and C6-7. Upper chest: Negative. Other: None. IMPRESSION: 1. No acute abnormality or finding to explain the patient's symptoms. 2. Cervical degenerative change as described above. Electronically Signed   By: Inge Rise M.D.   On: 02/17/2019 11:41    Procedures Procedures (including critical care time)  Medications Ordered in ED Medications - No data to display  ED Course  I have reviewed the triage vital signs and the nursing notes.  Pertinent labs & imaging results that were available during my care of the patient were reviewed by me and considered in my medical decision making (see chart for details).  34 year old male  presents with 2.5 weeks of neck pain which radiates to his head and causes headaches. He is mildly hypertensive but otherwise vitals are normal. His neurologic exam is grossly normal. He is asking for a CT scan of his neck to make sure everything is okay but sounds most likely it is MSK etiology. He has not had any infectious symptoms. He does not have meningismus. Low suspicion for meningitis. He had a negative COVID test recently as well. Will obtain CT C-spine  CT shows mild degenerative changes of C3-C4 and C4-C5. Advised to continue NSAIDs as needed. Will change meds to Meloxicam daily. He was referred to sports medicine by UC. Encouraged him to f/u with them.   MDM Rules/Calculators/A&P  Final Clinical Impression(s) / ED Diagnoses Final diagnoses:  Neck pain  Acute nonintractable headache, unspecified headache type    Rx / DC Orders ED Discharge Orders    None       Recardo Evangelist, PA-C 02/17/19 [place], DO 02/17/19 1535

## 2019-03-17 ENCOUNTER — Other Ambulatory Visit: Payer: Self-pay

## 2019-03-17 ENCOUNTER — Encounter (HOSPITAL_COMMUNITY): Payer: Self-pay | Admitting: Emergency Medicine

## 2019-03-17 ENCOUNTER — Emergency Department (HOSPITAL_COMMUNITY)
Admission: EM | Admit: 2019-03-17 | Discharge: 2019-03-17 | Disposition: A | Discharge status: Home / Self Care | Payer: Medicaid Other | Attending: Emergency Medicine | Admitting: Emergency Medicine

## 2019-03-17 DIAGNOSIS — I10 Essential (primary) hypertension: Secondary | ICD-10-CM | POA: Insufficient documentation

## 2019-03-17 DIAGNOSIS — R519 Headache, unspecified: Secondary | ICD-10-CM | POA: Insufficient documentation

## 2019-03-17 DIAGNOSIS — K0889 Other specified disorders of teeth and supporting structures: Secondary | ICD-10-CM

## 2019-03-17 DIAGNOSIS — Z79899 Other long term (current) drug therapy: Secondary | ICD-10-CM | POA: Insufficient documentation

## 2019-03-17 NOTE — ED Triage Notes (Addendum)
Patient reports headaches with sharp neck pain daily x1 month. Also c/o left lower dental pain x3 days.

## 2019-03-17 NOTE — ED Provider Notes (Signed)
[facility] DEPT Provider Note   CSN: [id number] Arrival date & time: 03/17/19  1347     History Chief Complaint  Patient presents with  . Headache  . Dental Pain    William Ali is a 34 y.o. male.  34 year old male presents with left lower third molar pain.  Denies any fever or chills.  No facial swelling.  No drainage noted from inside his mouth.  Pain worse with chewing.  No treatment use prior to arrival.  Patient also notes he has been having headaches and neck pain which he is currently being treated by chiropractor and that his main reason for coming today is for his tooth ache        Past Medical History:  Diagnosis Date  . Back pain   . Hypertension     There are no problems to display for this patient.   Past Surgical History:  Procedure Laterality Date  . KNEE SURGERY         Family History  Problem Relation Age of Onset  . Hypertension Mother   . Diabetes Father     Social History   Tobacco Use  . Smoking status: Never Smoker  . Smokeless tobacco: Never Used  Substance Use Topics  . Alcohol use: No  . Drug use: No    Home Medications Prior to Admission medications   Medication Sig Start Date End Date Taking? Authorizing Provider  acetaminophen (TYLENOL) 500 MG tablet Take 500 mg by mouth every 6 (six) hours as needed for moderate pain.    [provider]  butalbital-acetaminophen-caffeine (FIORICET) [phone] MG tablet Take 1 tablet by mouth every 6 (six) hours as needed for headache. 05/28/18 05/28/19  Scot Jun, FNP  cetirizine (ZYRTEC) 10 MG tablet Take 1 tablet (10 mg total) by mouth daily. 11/23/18   Zigmund Gottron, NP  cyclobenzaprine (FLEXERIL) 10 MG tablet Take 1 tablet by mouth 3 times daily as needed for muscle spasm. Warning: May cause drowsiness. 02/07/19   Vanessa Kick, MD  fluticasone (FLONASE) 50 MCG/ACT nasal spray Place 1 spray into both nostrils daily. 11/23/18   Zigmund Gottron, NP  hydrochlorothiazide (HYDRODIURIL) 25 MG tablet Take 1 tablet (25 mg total) by mouth daily. 07/21/18   Chase Picket, MD  ibuprofen (ADVIL) 800 MG tablet Take 1 tablet (800 mg total) by mouth 3 (three) times daily with meals. 02/07/19   Vanessa Kick, MD  meloxicam (MOBIC) 15 MG tablet Take 1 tablet (15 mg total) by mouth daily. 02/17/19   Recardo Evangelist, PA-C  multivitamin (ONE-A-DAY MEN'S) TABS tablet Take 1 tablet by mouth daily.    [provider]  amLODipine (NORVASC) 5 MG tablet Take 1 tablet (5 mg total) by mouth daily. 04/21/18 07/21/18  Robyn Haber, MD  famotidine (PEPCID) 20 MG tablet Take 1 tablet (20 mg total) by mouth 2 (two) times daily. Patient taking differently: Take 20 mg by mouth 2 (two) times daily as needed for heartburn.  04/21/18 07/21/18  Robyn Haber, MD    Allergies    Onion, Other, and Peanut-containing drug products  Review of Systems   Review of Systems  All other systems reviewed and are negative.   Physical Exam Updated Vital Signs BP (!) 162/97 (BP Location: Right Arm)   Pulse 87   Temp 98.2 F (36.8 C) (Oral)   Resp 18   SpO2 97%   Physical Exam Vitals and nursing note reviewed.  Constitutional:  General: He is not in acute distress.    Appearance: Normal appearance. He is well-developed. He is not toxic-appearing.  HENT:     Head: Normocephalic and atraumatic.     Mouth/Throat:   Eyes:     General: Lids are normal.     Conjunctiva/sclera: Conjunctivae normal.     Pupils: Pupils are equal, round, and reactive to light.  Neck:     Thyroid: No thyroid mass.     Trachea: No tracheal deviation.  Cardiovascular:     Rate and Rhythm: Normal rate and regular rhythm.     Heart sounds: Normal heart sounds. No murmur. No gallop.   Pulmonary:     Effort: Pulmonary effort is normal. No respiratory distress.     Breath sounds: Normal breath sounds. No stridor. No decreased breath sounds, wheezing, rhonchi or  rales.  Abdominal:     General: Bowel sounds are normal. There is no distension.     Palpations: Abdomen is soft.     Tenderness: There is no abdominal tenderness. There is no rebound.  Musculoskeletal:        General: No tenderness. Normal range of motion.     Cervical back: Normal range of motion and neck supple.  Skin:    General: Skin is warm and dry.     Findings: No abrasion or rash.  Neurological:     Mental Status: He is alert and oriented to person, place, and time.     GCS: GCS eye subscore is 4. GCS verbal subscore is 5. GCS motor subscore is 6.     Cranial Nerves: No cranial nerve deficit.     Sensory: No sensory deficit.  Psychiatric:        Speech: Speech normal.        Behavior: Behavior normal.     ED Results / Procedures / Treatments   Labs (all labs ordered are listed, but only abnormal results are displayed) Labs Reviewed - No data to display  EKG None  Radiology No results found.  Procedures Procedures (including critical care time)  Medications Ordered in ED Medications - No data to display  ED Course  I have reviewed the triage vital signs and the nursing notes.  Pertinent labs & imaging results that were available during my care of the patient were reviewed by me and considered in my medical decision making (see chart for details).    MDM Rules/Calculators/A&P                      [name] neurological exam stable here.  Has an impacted third molar and will follow up with his doctor as needed Final Clinical Impression(s) / ED Diagnoses Final diagnoses:  None    Rx / DC Orders ED Discharge Orders    None       Lacretia Leigh, MD 03/17/19 1420

## 2019-03-22 ENCOUNTER — Other Ambulatory Visit: Payer: Self-pay

## 2019-03-22 ENCOUNTER — Emergency Department (HOSPITAL_COMMUNITY)
Admission: EM | Admit: 2019-03-22 | Discharge: 2019-03-22 | Disposition: A | Discharge status: Home / Self Care | Payer: Self-pay | Attending: Emergency Medicine | Admitting: Emergency Medicine

## 2019-03-22 ENCOUNTER — Emergency Department (HOSPITAL_COMMUNITY): Payer: Self-pay

## 2019-03-22 ENCOUNTER — Encounter (HOSPITAL_COMMUNITY): Payer: Self-pay | Admitting: Emergency Medicine

## 2019-03-22 DIAGNOSIS — Y929 Unspecified place or not applicable: Secondary | ICD-10-CM | POA: Insufficient documentation

## 2019-03-22 DIAGNOSIS — Z91010 Allergy to peanuts: Secondary | ICD-10-CM | POA: Insufficient documentation

## 2019-03-22 DIAGNOSIS — Y999 Unspecified external cause status: Secondary | ICD-10-CM | POA: Insufficient documentation

## 2019-03-22 DIAGNOSIS — I10 Essential (primary) hypertension: Secondary | ICD-10-CM | POA: Insufficient documentation

## 2019-03-22 DIAGNOSIS — Y939 Activity, unspecified: Secondary | ICD-10-CM | POA: Insufficient documentation

## 2019-03-22 DIAGNOSIS — X58XXXA Exposure to other specified factors, initial encounter: Secondary | ICD-10-CM | POA: Insufficient documentation

## 2019-03-22 DIAGNOSIS — Z79899 Other long term (current) drug therapy: Secondary | ICD-10-CM | POA: Insufficient documentation

## 2019-03-22 DIAGNOSIS — S82892A Other fracture of left lower leg, initial encounter for closed fracture: Secondary | ICD-10-CM | POA: Insufficient documentation

## 2019-03-22 IMAGING — CR DG ANKLE COMPLETE 3+V*L*
3 series · 3 of 3 positions shown · non-contrast
Comparison: [DATE]

CLINICAL DATA: Pain

EXAM:
LEFT ANKLE COMPLETE - 3+ VIEW

[x ankle ap left]
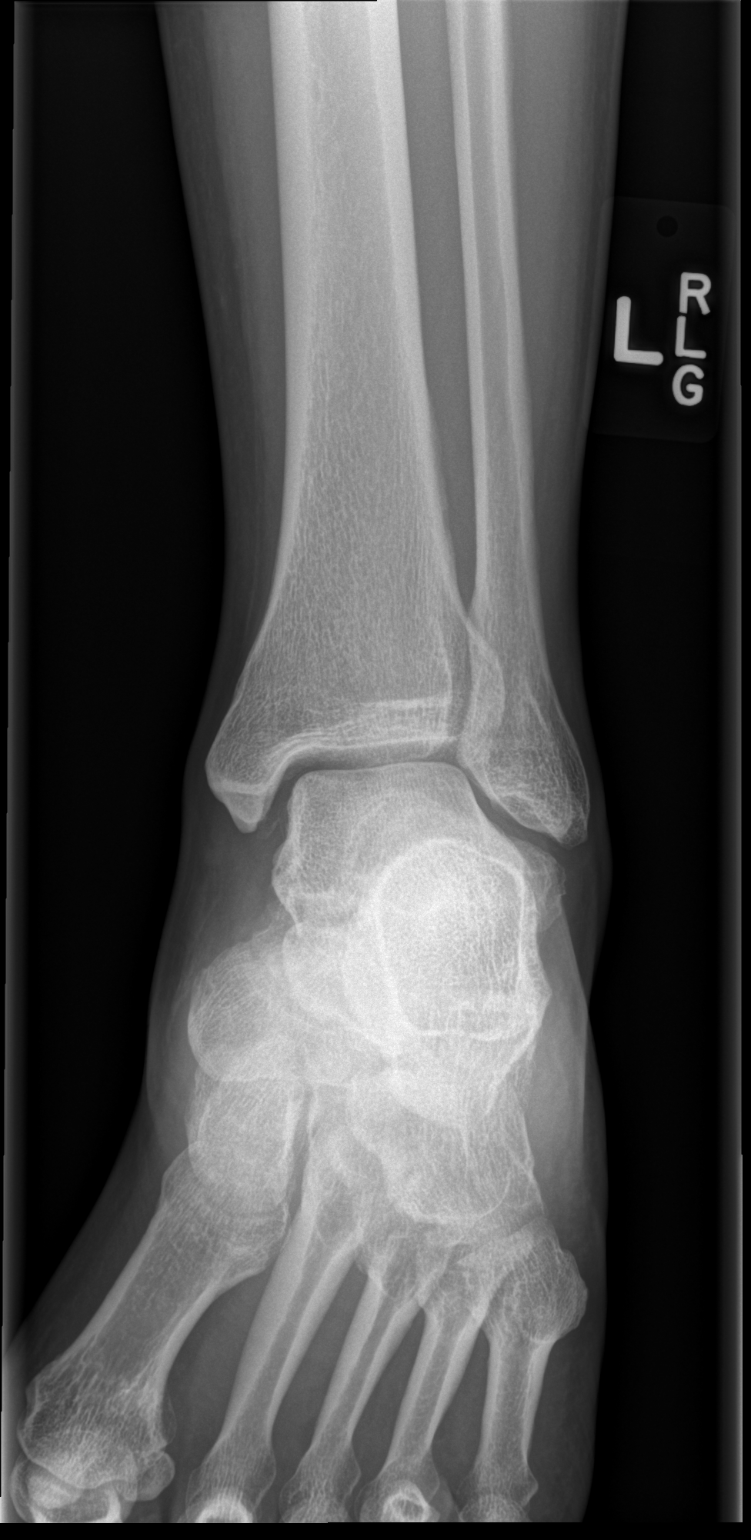

[x ankle obl left]
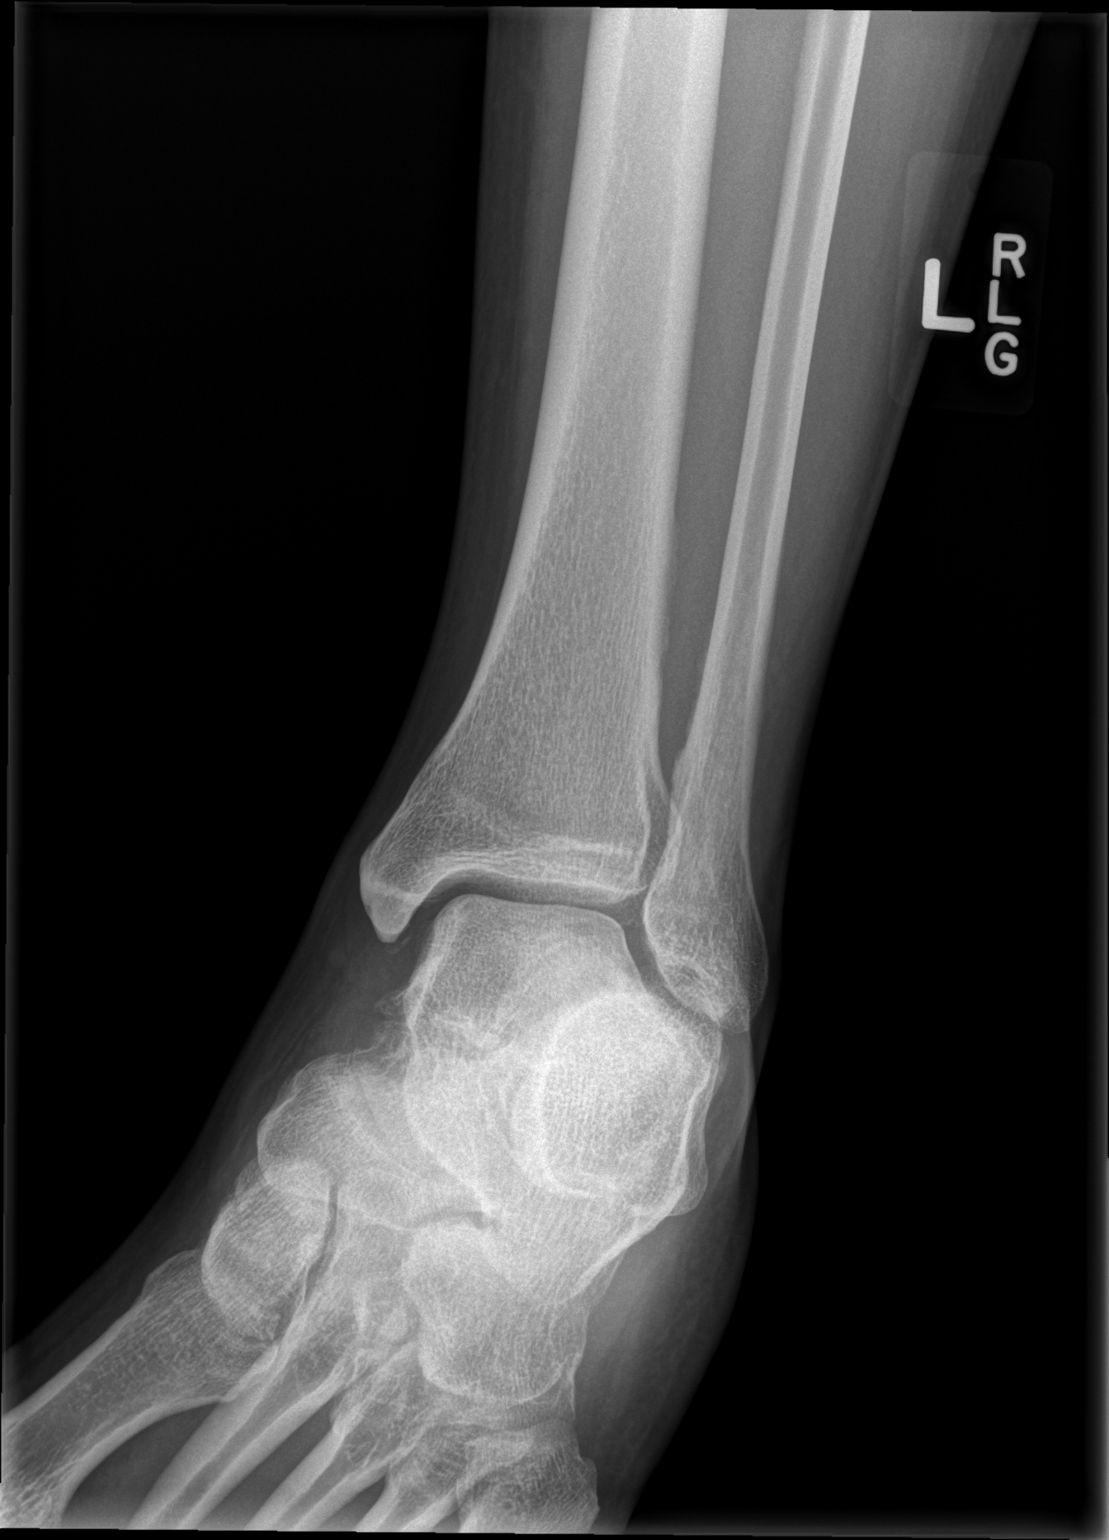

[x ankle lat left]
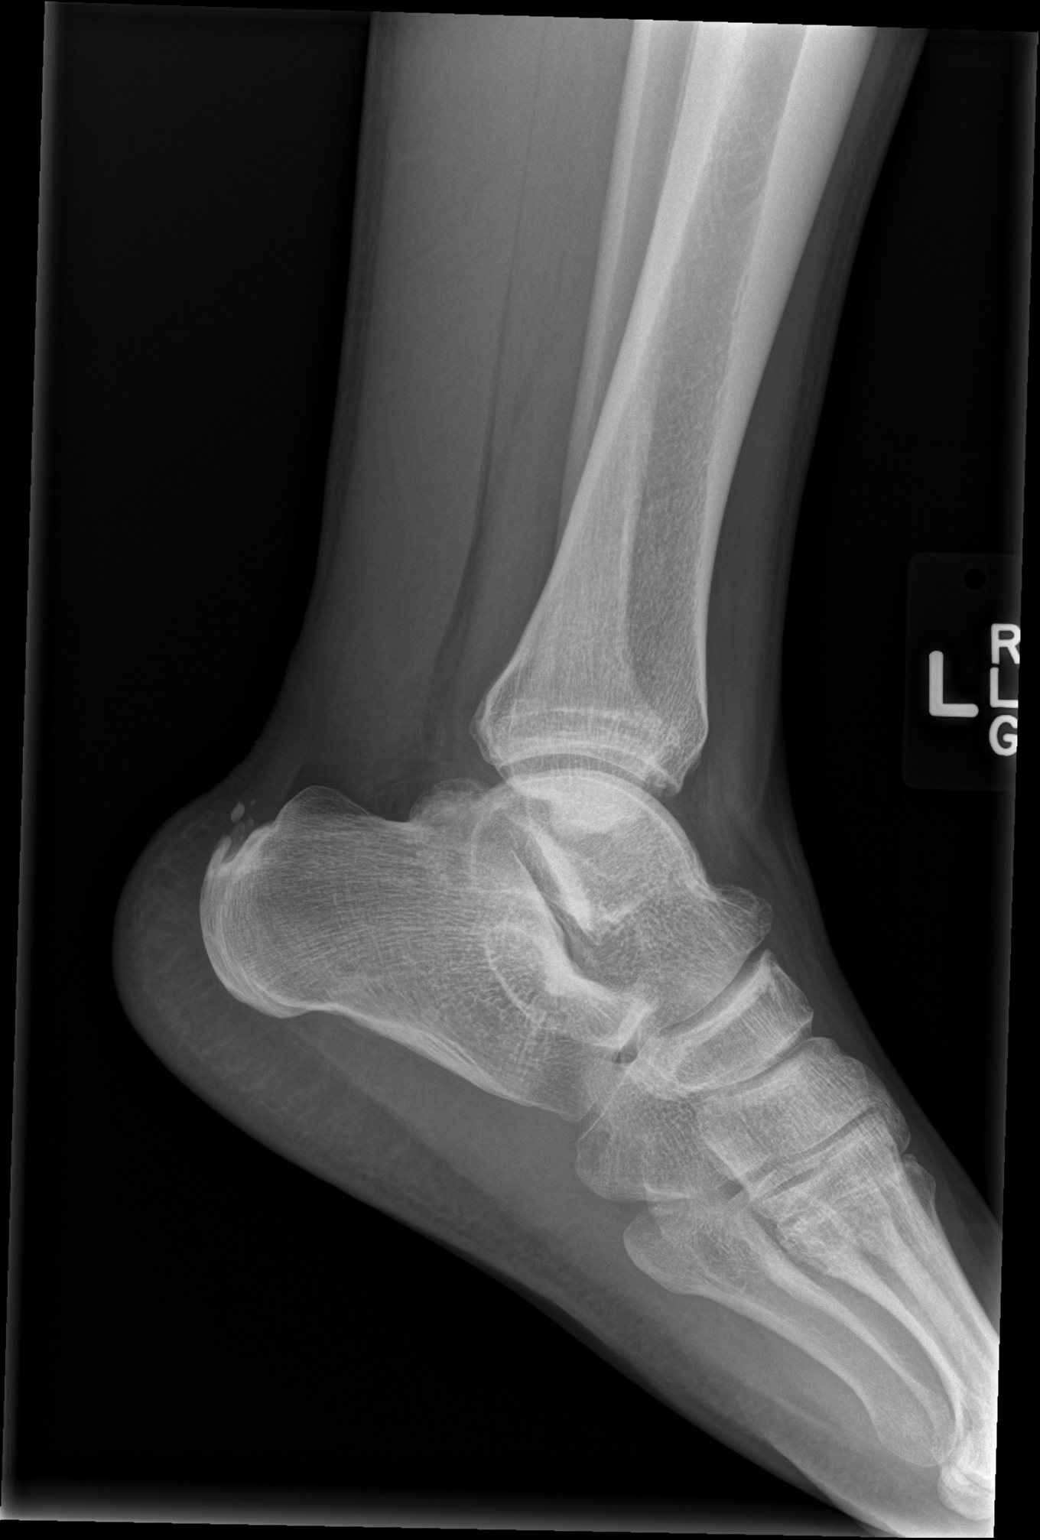

[3 of 3 positions shown; findings below may reference images not displayed]

FINDINGS: Frontal, oblique, and lateral views obtained. There is an apparent
avulsion arising from the lateral aspect of the medial malleolus
with a small calcification between the medial malleolus and talus.
This finding was not present previously. No other findings elsewhere
suggesting fracture. No joint effusion. There is no appreciable
joint space narrowing or erosion. The ankle mortise appears intact.
There is a posterior calcaneal spur.
IMPRESSION: Apparent avulsion at the level of the medial malleolus, located
between the medial malleolus and talus. No other evident fracture.
Ankle mortise appears intact. No appreciable joint space narrowing.
There is a posterior calcaneal spur.

## 2019-03-22 NOTE — ED Notes (Signed)
Ladell Pier PA at bedside

## 2019-03-22 NOTE — ED Notes (Signed)
Pt transported to Xray. 

## 2019-03-22 NOTE — ED Provider Notes (Signed)
[facility] DEPT Provider Note   CSN: [id number] Arrival date & time: 03/22/19  1052     History Chief Complaint  Patient presents with  . Foot Pain  . Ankle Pain    William Ali is a 34 y.o. male.  34yo male presents with complaint of left ankle pain onset last night without injury, worse with any light touch/bearing weight. Pain located anterior/medial right ankle.  Denies any injury, reports history of gout in the right ankle, also states he was once told he had gout in his right ankle and actually had a non traumatic fracture. No other complaints or concerns.         Past Medical History:  Diagnosis Date  . Back pain   . Hypertension     There are no problems to display for this patient.   Past Surgical History:  Procedure Laterality Date  . KNEE SURGERY         Family History  Problem Relation Age of Onset  . Hypertension Mother   . Diabetes Father     Social History   Tobacco Use  . Smoking status: Never Smoker  . Smokeless tobacco: Never Used  Substance Use Topics  . Alcohol use: No  . Drug use: No    Home Medications Prior to Admission medications   Medication Sig Start Date End Date Taking? Authorizing Provider  acetaminophen (TYLENOL) 500 MG tablet Take 500 mg by mouth every 6 (six) hours as needed for moderate pain.    [provider]  butalbital-acetaminophen-caffeine (FIORICET) [phone] MG tablet Take 1 tablet by mouth every 6 (six) hours as needed for headache. 05/28/18 05/28/19  Scot Jun, FNP  cetirizine (ZYRTEC) 10 MG tablet Take 1 tablet (10 mg total) by mouth daily. 11/23/18   Zigmund Gottron, NP  cyclobenzaprine (FLEXERIL) 10 MG tablet Take 1 tablet by mouth 3 times daily as needed for muscle spasm. Warning: May cause drowsiness. 02/07/19   Vanessa Kick, MD  fluticasone (FLONASE) 50 MCG/ACT nasal spray Place 1 spray into both nostrils daily. 11/23/18   Zigmund Gottron, NP   hydrochlorothiazide (HYDRODIURIL) 25 MG tablet Take 1 tablet (25 mg total) by mouth daily. 07/21/18   Chase Picket, MD  ibuprofen (ADVIL) 800 MG tablet Take 1 tablet (800 mg total) by mouth 3 (three) times daily with meals. 02/07/19   Vanessa Kick, MD  meloxicam (MOBIC) 15 MG tablet Take 1 tablet (15 mg total) by mouth daily. 02/17/19   Recardo Evangelist, PA-C  multivitamin (ONE-A-DAY MEN'S) TABS tablet Take 1 tablet by mouth daily.    [provider]  amLODipine (NORVASC) 5 MG tablet Take 1 tablet (5 mg total) by mouth daily. 04/21/18 07/21/18  Robyn Haber, MD  famotidine (PEPCID) 20 MG tablet Take 1 tablet (20 mg total) by mouth 2 (two) times daily. Patient taking differently: Take 20 mg by mouth 2 (two) times daily as needed for heartburn.  04/21/18 07/21/18  Robyn Haber, MD    Allergies    Onion, Other, and Peanut-containing drug products  Review of Systems   Review of Systems  Constitutional: Negative for fever.  Musculoskeletal: Positive for arthralgias and gait problem. Negative for joint swelling and myalgias.  Skin: Negative for color change, rash and wound.  Allergic/Immunologic: Negative for immunocompromised state.  Neurological: Negative for weakness and numbness.  Hematological: Does not bruise/bleed easily.  Psychiatric/Behavioral: Negative for self-injury.  All other systems reviewed and are negative.   Physical  Exam Updated Vital Signs BP (!) 169/100 (BP Location: Left Arm)   Pulse 92   Temp 98.3 F (36.8 C) (Oral)   Resp 18   SpO2 100%   Physical Exam Vitals and nursing note reviewed.  Constitutional:      General: He is not in acute distress.    Appearance: He is well-developed. He is not diaphoretic.  HENT:     Head: Normocephalic and atraumatic.  Cardiovascular:     Pulses: Normal pulses.  Pulmonary:     Effort: Pulmonary effort is normal.  Musculoskeletal:        General: Tenderness present. No swelling, deformity or signs of  injury.     Right lower leg: No edema.     Left lower leg: No edema.     Left ankle: Tenderness present. Decreased range of motion.     Comments: TTP just anterior to the medial malleolus without swelling, redness, warmth. Pain with dorsi and plantar flexion.   Skin:    General: Skin is warm and dry.     Findings: No bruising, erythema, lesion or rash.  Neurological:     Mental Status: He is alert and oriented to person, place, and time.     Sensory: No sensory deficit.  Psychiatric:        Behavior: Behavior normal.     ED Results / Procedures / Treatments   Labs (all labs ordered are listed, but only abnormal results are displayed) Labs Reviewed - No data to display  EKG None  Radiology DG Ankle Complete Left  Result Date: 03/22/2019 CLINICAL DATA:  Pain EXAM: LEFT ANKLE COMPLETE - 3+ VIEW COMPARISON:  December 06, 2010 FINDINGS: Frontal, oblique, and lateral views obtained. There is an apparent avulsion arising from the lateral aspect of the medial malleolus with a small calcification between the medial malleolus and talus. This finding was not present previously. No other findings elsewhere suggesting fracture. No joint effusion. There is no appreciable joint space narrowing or erosion. The ankle mortise appears intact. There is a posterior calcaneal spur. IMPRESSION: Apparent avulsion at the level of the medial malleolus, located between the medial malleolus and talus. No other evident fracture. Ankle mortise appears intact. No appreciable joint space narrowing. There is a posterior calcaneal spur. Electronically Signed   By: Lowella Grip III M.D.   On: 03/22/2019 12:22    Procedures Procedures (including critical care time)  Medications Ordered in ED Medications - No data to display  ED Course  I have reviewed the triage vital signs and the nursing notes.  Pertinent labs & imaging results that were available during my care of the patient were reviewed by me and  considered in my medical decision making (see chart for details).  Clinical Course as of Mar 22 1235  Wed Mar 22, 2019  1235 33yo male with left ankle pain without injury. On exam, TTP just anterior to the medial malleolus, no skin changes.  Patient reports similar pain in his right ankle in the past and was found to have a fracture without associated trauma.  X-ray of the left ankle today shows a small avulsion fracture not present on prior study.  Avulsion fracture is present in the area of tenderness.  We will treat with a cam walker and referred to orthopedics, recommend Motrin Tylenol, ice and elevate.   [LM]    Clinical Course User Index [LM] Roque Lias   MDM Rules/Calculators/A&P  Final Clinical Impression(s) / ED Diagnoses Final diagnoses:  Avulsion fracture of ankle, left, closed, initial encounter    Rx / DC Orders ED Discharge Orders    None       Roque Lias 03/22/19 1237    Little, Wenda Overland, MD 03/23/19 2017

## 2019-03-22 NOTE — ED Triage Notes (Signed)
Pt reports noticed left foot and ankle pains yesterday. Pain worse with touching or weight bearing. Unsure what caused the pain.

## 2019-03-22 NOTE — ED Notes (Addendum)
Pt verbalizes understanding of DC instructions. Pt belongings returned and is ambulatory out of ED.   Pt did not sign signature pad

## 2019-03-22 NOTE — Discharge Instructions (Addendum)
Wear boot and weight bear as tolerated. Take Motrin and Tylenol as needed as directed for pain. Apply ice for 30 minutes and elevate ankle three times daily. Follow up with orthopedics for recheck.

## 2019-04-23 ENCOUNTER — Emergency Department (HOSPITAL_COMMUNITY)
Admission: EM | Admit: 2019-04-23 | Discharge: 2019-04-23 | Disposition: A | Discharge status: Home / Self Care | Payer: Medicaid Other | Attending: Emergency Medicine | Admitting: Emergency Medicine

## 2019-04-23 ENCOUNTER — Encounter (HOSPITAL_COMMUNITY): Payer: Self-pay

## 2019-04-23 ENCOUNTER — Other Ambulatory Visit: Payer: Self-pay

## 2019-04-23 DIAGNOSIS — Z91010 Allergy to peanuts: Secondary | ICD-10-CM | POA: Insufficient documentation

## 2019-04-23 DIAGNOSIS — I10 Essential (primary) hypertension: Secondary | ICD-10-CM | POA: Insufficient documentation

## 2019-04-23 DIAGNOSIS — J209 Acute bronchitis, unspecified: Secondary | ICD-10-CM

## 2019-04-23 DIAGNOSIS — Z79899 Other long term (current) drug therapy: Secondary | ICD-10-CM | POA: Insufficient documentation

## 2019-04-23 MED ORDER — AZITHROMYCIN 250 MG PO TABS: ORAL_TABLET | ORAL | 0 refills | Status: DC

## 2019-04-23 NOTE — ED Triage Notes (Signed)
Pt reports cough and headache x3 weeks. States that he recently started seeing a chiropractor and heard that chiropractic adjustments can cause flu like symptoms, but he wants to make sure that this isn't something worse. States that he had a negative COVID last week. He states that the cough is worse at night.

## 2019-04-23 NOTE — ED Provider Notes (Signed)
William Ali   CSN: [id number] Arrival date & time: 04/23/19  0434     History Chief Complaint  Patient presents with   Cough   Headache    Lum William Ali is a 34 y.o. male.  Patient is a 34 year old male with past medical history of hypertension.  He presents today with complaints of a 3-week history of cough.  He has been taking over-the-counter medications with little relief.  His cough is productive of a white sputum.  He denies any fevers or chills.  He denies shortness of breath.  He did have a Covid test performed last week which was negative.  He denies ill contacts.  The history is provided by the patient.  Cough Cough characteristics:  Productive Sputum characteristics:  White Severity:  Moderate Onset quality:  Gradual Duration:  3 weeks Timing:  Constant Progression:  Worsening Chronicity:  New Smoker: no   Context: upper respiratory infection   Relieved by:  Nothing Worsened by:  Nothing Ineffective treatments:  Decongestant and cough suppressants      Past Medical History:  Diagnosis Date   Back pain    Hypertension     There are no problems to display for this patient.   Past Surgical History:  Procedure Laterality Date   KNEE SURGERY         Family History  Problem Relation Age of Onset   Hypertension Mother    Diabetes Father     Social History   Tobacco Use   Smoking status: Never Smoker   Smokeless tobacco: Never Used  Substance Use Topics   Alcohol use: No   Drug use: No    Home Medications Prior to Admission medications   Medication Sig Start Date End Date Taking? Authorizing Provider  acetaminophen (TYLENOL) 500 MG tablet Take 500 mg by mouth every 6 (six) hours as needed for moderate pain.    [provider]  butalbital-acetaminophen-caffeine (FIORICET) [phone] MG tablet Take 1 tablet by mouth every 6 (six) hours as needed for headache. 05/28/18 05/28/19   Scot Jun, FNP  cetirizine (ZYRTEC) 10 MG tablet Take 1 tablet (10 mg total) by mouth daily. 11/23/18   Zigmund Gottron, NP  cyclobenzaprine (FLEXERIL) 10 MG tablet Take 1 tablet by mouth 3 times daily as needed for muscle spasm. Warning: May cause drowsiness. 02/07/19   Vanessa Kick, MD  fluticasone (FLONASE) 50 MCG/ACT nasal spray Place 1 spray into both nostrils daily. 11/23/18   Zigmund Gottron, NP  hydrochlorothiazide (HYDRODIURIL) 25 MG tablet Take 1 tablet (25 mg total) by mouth daily. 07/21/18   Chase Picket, MD  ibuprofen (ADVIL) 800 MG tablet Take 1 tablet (800 mg total) by mouth 3 (three) times daily with meals. 02/07/19   Vanessa Kick, MD  meloxicam (MOBIC) 15 MG tablet Take 1 tablet (15 mg total) by mouth daily. 02/17/19   Recardo Evangelist, PA-C  multivitamin (ONE-A-DAY MEN'S) TABS tablet Take 1 tablet by mouth daily.    [provider]  amLODipine (NORVASC) 5 MG tablet Take 1 tablet (5 mg total) by mouth daily. 04/21/18 07/21/18  Robyn Haber, MD  famotidine (PEPCID) 20 MG tablet Take 1 tablet (20 mg total) by mouth 2 (two) times daily. Patient taking differently: Take 20 mg by mouth 2 (two) times daily as needed for heartburn.  04/21/18 07/21/18  Robyn Haber, MD    Allergies    Onion, Other, and Peanut-containing drug products  Review of  Systems   Review of Systems  All other systems reviewed and are negative.   Physical Exam Updated Vital Signs BP (!) 165/106 (BP Location: Left Arm)    Pulse 85    Temp 98.4 F (36.9 C) (Oral)    Resp 18    Ht 5\' 7"  (1.702 m)    Wt 105.2 kg    SpO2 95%    BMI 36.34 kg/m   Physical Exam Vitals and nursing Ali reviewed.  Constitutional:      General: He is not in acute distress.    Appearance: He is well-developed. He is not diaphoretic.  HENT:     Head: Normocephalic and atraumatic.  Cardiovascular:     Rate and Rhythm: Normal rate and regular rhythm.     Heart sounds: No murmur. No friction rub.   Pulmonary:     Effort: Pulmonary effort is normal. No respiratory distress.     Breath sounds: Normal breath sounds. No wheezing or rales.  Abdominal:     General: Bowel sounds are normal. There is no distension.     Palpations: Abdomen is soft.     Tenderness: There is no abdominal tenderness.  Musculoskeletal:        General: Normal range of motion.     Cervical back: Normal range of motion and neck supple.  Skin:    General: Skin is warm and dry.  Neurological:     Mental Status: He is alert and oriented to person, place, and time.     Coordination: Coordination normal.     ED Results / Procedures / Treatments   Labs (all labs ordered are listed, but only abnormal results are displayed) Labs Reviewed - No data to display  EKG None  Radiology No results found.  Procedures Procedures (including critical care time)  Medications Ordered in ED Medications - No data to display  ED Course  I have reviewed the triage vital signs and the nursing notes.  Pertinent labs & imaging results that were available during my care of the patient were reviewed by me and considered in my medical decision making (see chart for details).    MDM Rules/Calculators/A&P  Patient presenting with a 3-week history of upper respiratory infection symptoms unrelieved with over-the-counter medications.  Patient appears clinically well and vitals are stable.  He has no rales on exam and there is no hypoxia.  He had a negative Covid test last week and this does not sound like Covid.  Patient to be treated for acute bronchitis with Zithromax and continued use of over-the-counter medications.  Final Clinical Impression(s) / ED Diagnoses Final diagnoses:  None    Rx / DC Orders ED Discharge Orders    None       Veryl Speak, MD 04/23/19 3244

## 2019-04-23 NOTE — Discharge Instructions (Addendum)
Begin taking Zithromax as prescribed.  Continue use of over-the-counter medications as needed for symptom relief.  Return to the emergency department if your symptoms significantly worsen or change.

## 2019-05-05 ENCOUNTER — Encounter (HOSPITAL_COMMUNITY): Payer: Self-pay

## 2019-05-05 ENCOUNTER — Other Ambulatory Visit: Payer: Self-pay

## 2019-05-05 ENCOUNTER — Ambulatory Visit (HOSPITAL_COMMUNITY)
Admission: EM | Admit: 2019-05-05 | Discharge: 2019-05-05 | Disposition: A | Discharge status: Home / Self Care | Payer: Medicaid Other

## 2019-05-05 DIAGNOSIS — R05 Cough: Secondary | ICD-10-CM

## 2019-05-05 DIAGNOSIS — R059 Cough, unspecified: Secondary | ICD-10-CM

## 2019-05-05 DIAGNOSIS — J0100 Acute maxillary sinusitis, unspecified: Secondary | ICD-10-CM

## 2019-05-05 MED ORDER — AMOXICILLIN-POT CLAVULANATE 875-125 MG PO TABS: 1.0000 | ORAL_TABLET | Freq: Two times a day (BID) | ORAL | 0 refills | Status: DC

## 2019-05-05 MED ORDER — BENZONATATE 100 MG PO CAPS: 100.0000 mg | ORAL_CAPSULE | Freq: Three times a day (TID) | ORAL | 0 refills | Status: DC

## 2019-05-05 MED ORDER — SALINE SPRAY 0.65 % NA SOLN: 1.0000 | NASAL | 0 refills | Status: DC | PRN

## 2019-05-05 MED ORDER — AMOXICILLIN-POT CLAVULANATE 875-125 MG PO TABS: 1.0000 | ORAL_TABLET | Freq: Two times a day (BID) | ORAL | 0 refills | Status: AC

## 2019-05-05 NOTE — ED Provider Notes (Signed)
[facility]    CSN: [id number] Arrival date & time: 05/05/19  1757      History   Chief Complaint Chief Complaint  Patient presents with  . Nasal Congestion  . Headache  . Cough    HPI William Ali is a 34 y.o. male.   Patient presents for evaluation of 6-week duration cough with recent 3-week worsening nasal congestion with facial pain.  He reports he had a cough that originally started 6 weeks ago, after initial 3 weeks of illness he was evaluated emergency department and diagnosed with acute bronchitis and treated with azithromycin.  He has continued to have a cough that is worse at night and when laying down.  He does have some phlegm that he coughs up.  He does feel nasal drainage running down the back of his throat.  He has had some chest pain with cough and some intermittent shortness of breath.  He reports that since that ED visit 3 weeks ago he had worsening nasal congestion that, such that he wakes up with a large amount of drainage and has had worsening facial pain.  Drainage is yellow and green at times.  He has had no fever or chills.  He has had headaches intermittently.  His nausea, vomiting, diarrhea change or loss of smell.  He reports he was Covid tested yesterday at an outside facility for which he has received his results and he had a negative Covid test.  Denies ear pain, sore throat, nausea, vomiting, diarrhea.  Denies itchy eyes or sneezing.     Past Medical History:  Diagnosis Date  . Back pain   . Hypertension     There are no problems to display for this patient.   Past Surgical History:  Procedure Laterality Date  . KNEE SURGERY         Home Medications    Prior to Admission medications   Medication Sig Start Date End Date Taking? Authorizing Provider  acetaminophen (TYLENOL) 500 MG tablet Take 500 mg by mouth every 6 (six) hours as needed for moderate pain.    [provider]  amoxicillin-clavulanate (AUGMENTIN)  875-125 MG tablet Take 1 tablet by mouth 2 (two) times daily for 10 days. 05/05/19 05/15/19  Jakin Pavao, Marguerita Beards, PA-C  azithromycin (ZITHROMAX Z-PAK) 250 MG tablet 2 po day one, then 1 daily x 4 days 04/23/19   Veryl Speak, MD  benzonatate (TESSALON) 100 MG capsule Take 1 capsule (100 mg total) by mouth every 8 (eight) hours. 05/05/19   Birdena Kingma, Marguerita Beards, PA-C  butalbital-acetaminophen-caffeine (FIORICET) [phone] MG tablet Take 1 tablet by mouth every 6 (six) hours as needed for headache. 05/28/18 05/28/19  Scot Jun, FNP  cetirizine (ZYRTEC) 10 MG tablet Take 1 tablet (10 mg total) by mouth daily. 11/23/18   Zigmund Gottron, NP  cyclobenzaprine (FLEXERIL) 10 MG tablet Take 1 tablet by mouth 3 times daily as needed for muscle spasm. Warning: May cause drowsiness. 02/07/19   Vanessa Kick, MD  fluticasone (FLONASE) 50 MCG/ACT nasal spray Place 1 spray into both nostrils daily. 11/23/18   Zigmund Gottron, NP  hydrochlorothiazide (HYDRODIURIL) 25 MG tablet Take 1 tablet (25 mg total) by mouth daily. 07/21/18   Chase Picket, MD  ibuprofen (ADVIL) 800 MG tablet Take 1 tablet (800 mg total) by mouth 3 (three) times daily with meals. 02/07/19   Vanessa Kick, MD  meloxicam (MOBIC) 15 MG tablet Take 1 tablet (15 mg total) by mouth daily.  02/17/19   Recardo Evangelist, PA-C  multivitamin (ONE-A-DAY MEN'S) TABS tablet Take 1 tablet by mouth daily.    [provider]  pantoprazole (PROTONIX) 40 MG tablet Take 40 mg by mouth daily. 04/11/19   [provider]  sodium chloride (OCEAN) 0.65 % SOLN nasal spray Place 1 spray into both nostrils as needed for congestion. 05/05/19   Terez Freimark, Marguerita Beards, PA-C  amLODipine (NORVASC) 5 MG tablet Take 1 tablet (5 mg total) by mouth daily. 04/21/18 07/21/18  Robyn Haber, MD  famotidine (PEPCID) 20 MG tablet Take 1 tablet (20 mg total) by mouth 2 (two) times daily. Patient taking differently: Take 20 mg by mouth 2 (two) times daily as needed for heartburn.  04/21/18  07/21/18  Robyn Haber, MD    Family History Family History  Problem Relation Age of Onset  . Hypertension Mother   . Diabetes Father     Social History Social History   Tobacco Use  . Smoking status: Never Smoker  . Smokeless tobacco: Never Used  Substance Use Topics  . Alcohol use: No  . Drug use: No     Allergies   Onion, Other, Peanut oil, and Peanut-containing drug products   Review of Systems Review of Systems  See HPI Physical Exam Triage Vital Signs ED Triage Vitals  Enc Vitals Group     BP 05/05/19 1937 (!) 157/99     Pulse Rate 05/05/19 1937 89     Resp 05/05/19 1937 (!) 21     Temp 05/05/19 1937 98.5 F (36.9 C)     Temp Source 05/05/19 1937 Oral     SpO2 05/05/19 1937 97 %     Weight --      Height --      Head Circumference --      Peak Flow --      Pain Score 05/05/19 1934 8     Pain Loc --      Pain Edu? --      Excl. in [employer]? --    No data found.  Updated Vital Signs BP (!) 157/99 (BP Location: Right Arm)   Pulse 89   Temp 98.5 F (36.9 C) (Oral)   Resp (!) 21   SpO2 97%   Visual Acuity Right Eye Distance:   Left Eye Distance:   Bilateral Distance:    Right Eye Near:   Left Eye Near:    Bilateral Near:     Physical Exam Vitals and nursing note reviewed.  Constitutional:      General: He is not in acute distress.    Appearance: He is well-developed. He is not ill-appearing.  HENT:     Head: Normocephalic and atraumatic.     Nose:     Right Sinus: Maxillary sinus tenderness and frontal sinus tenderness present.     Left Sinus: Maxillary sinus tenderness and frontal sinus tenderness present.     Comments: Erythematous and swollen turbinates with purulent discharge bilaterally    Mouth/Throat:     Mouth: Mucous membranes are moist.     Comments: Postnasal drip in oropharynx Eyes:     Extraocular Movements: Extraocular movements intact.     Conjunctiva/sclera: Conjunctivae normal.     Pupils: Pupils are equal, round,  and reactive to light.  Cardiovascular:     Rate and Rhythm: Normal rate and regular rhythm.     Heart sounds: No murmur.  Pulmonary:     Effort: Pulmonary effort is normal. No respiratory distress.  Breath sounds: Normal breath sounds. No wheezing, rhonchi or rales.  Abdominal:     Palpations: Abdomen is soft.     Tenderness: There is no abdominal tenderness.  Musculoskeletal:     Cervical back: Neck supple.  Lymphadenopathy:     Cervical: No cervical adenopathy.  Skin:    General: Skin is warm and dry.  Neurological:     Mental Status: He is alert.      UC Treatments / Results  Labs (all labs ordered are listed, but only abnormal results are displayed) Labs Reviewed - No data to display  EKG   Radiology No results found.  Procedures Procedures (including critical care time)  Medications Ordered in UC Medications - No data to display  Initial Impression / Assessment and Plan / UC Course  I have reviewed the triage vital signs and the nursing notes.  Pertinent labs & imaging results that were available during my care of the patient were reviewed by me and considered in my medical decision making (see chart for details).     #Sinusitis #Cough Patient 34 year old male presenting with symptoms consistent with acute bacterial sinusitis with cough secondary to postnasal drip and recovering from bronchitis.  Patient is afebrile and saturating well.  He has benign lung exam.  Given duration of sinus congestion symptoms that appear to be worsening with facial pain and presence of worsening cough will treat with Augmentin.  Recommended nasal saline and prescribed cough medicine.  Also recommend patient start Zyrtec for allergy prevention.  Patient given follow-up emergency department cautions.  Patient verbalized understanding. Final Clinical Impressions(s) / UC Diagnoses   Final diagnoses:  Acute non-recurrent maxillary sinusitis  Cough     Discharge Instructions      Start the Augmentin the night and then it is twice a day for 10 days Use the cough medicine every 8 hours Use nasal saline sprays when she like throughout the day Begin taking Zyrtec  If you are not improving over the next 3 to 5 days please return for reevaluation.  If you have worsening shortness of breath or chest pain please return for reevaluation.      ED Prescriptions    Medication Sig Dispense Auth. Provider   amoxicillin-clavulanate (AUGMENTIN) 875-125 MG tablet  (Status: Discontinued) Take 1 tablet by mouth 2 (two) times daily for 10 days. 20 tablet Omaira Mellen, Marguerita Beards, PA-C   benzonatate (TESSALON) 100 MG capsule  (Status: Discontinued) Take 1 capsule (100 mg total) by mouth every 8 (eight) hours. 21 capsule Brunette Lavalle, Marguerita Beards, PA-C   sodium chloride (OCEAN) 0.65 % SOLN nasal spray  (Status: Discontinued) Place 1 spray into both nostrils as needed for congestion. 44 mL Alissandra Geoffroy, Marguerita Beards, PA-C   amoxicillin-clavulanate (AUGMENTIN) 875-125 MG tablet Take 1 tablet by mouth 2 (two) times daily for 10 days. 20 tablet Gertha Lichtenberg, Marguerita Beards, PA-C   benzonatate (TESSALON) 100 MG capsule Take 1 capsule (100 mg total) by mouth every 8 (eight) hours. 21 capsule Najmah Carradine, Marguerita Beards, PA-C   sodium chloride (OCEAN) 0.65 % SOLN nasal spray Place 1 spray into both nostrils as needed for congestion. 44 mL Donnie Panik, Marguerita Beards, PA-C     PDMP not reviewed this encounter.   Purnell Shoemaker, PA-C 05/06/19 [phone]

## 2019-05-05 NOTE — ED Triage Notes (Addendum)
Pt presents to  UC with cough, nasal congestion and headaches x 3 weeks. Pt states he was treated with Azithromycin for Bronchitis 2 weeks ago without relief. Pt reports the cough is worst at night, he is not able to sleep. Pt had tested negative for COVID last night.

## 2019-05-05 NOTE — Discharge Instructions (Addendum)
Start the Augmentin the night and then it is twice a day for 10 days Use the cough medicine every 8 hours Use nasal saline sprays when she like throughout the day Begin taking Zyrtec  If you are not improving over the next 3 to 5 days please return for reevaluation.  If you have worsening shortness of breath or chest pain please return for reevaluation.

## 2019-05-06 ENCOUNTER — Emergency Department (HOSPITAL_COMMUNITY): Payer: Medicaid Other

## 2019-05-06 ENCOUNTER — Encounter (HOSPITAL_COMMUNITY): Payer: Self-pay | Admitting: *Deleted

## 2019-05-06 ENCOUNTER — Emergency Department (HOSPITAL_COMMUNITY)
Admission: EM | Admit: 2019-05-06 | Discharge: 2019-05-06 | Disposition: A | Discharge status: Home / Self Care | Payer: Medicaid Other | Attending: Emergency Medicine | Admitting: Emergency Medicine

## 2019-05-06 ENCOUNTER — Other Ambulatory Visit: Payer: Self-pay

## 2019-05-06 DIAGNOSIS — I10 Essential (primary) hypertension: Secondary | ICD-10-CM | POA: Insufficient documentation

## 2019-05-06 DIAGNOSIS — Z79899 Other long term (current) drug therapy: Secondary | ICD-10-CM | POA: Insufficient documentation

## 2019-05-06 DIAGNOSIS — M545 Low back pain: Secondary | ICD-10-CM | POA: Insufficient documentation

## 2019-05-06 DIAGNOSIS — R0789 Other chest pain: Secondary | ICD-10-CM | POA: Insufficient documentation

## 2019-05-06 IMAGING — CR DG CHEST 2V
2 series · 2 of 2 positions shown · non-contrast
Comparison: [DATE]

CLINICAL DATA: Chest pain

EXAM:
CHEST - 2 VIEW

[w chest pa]
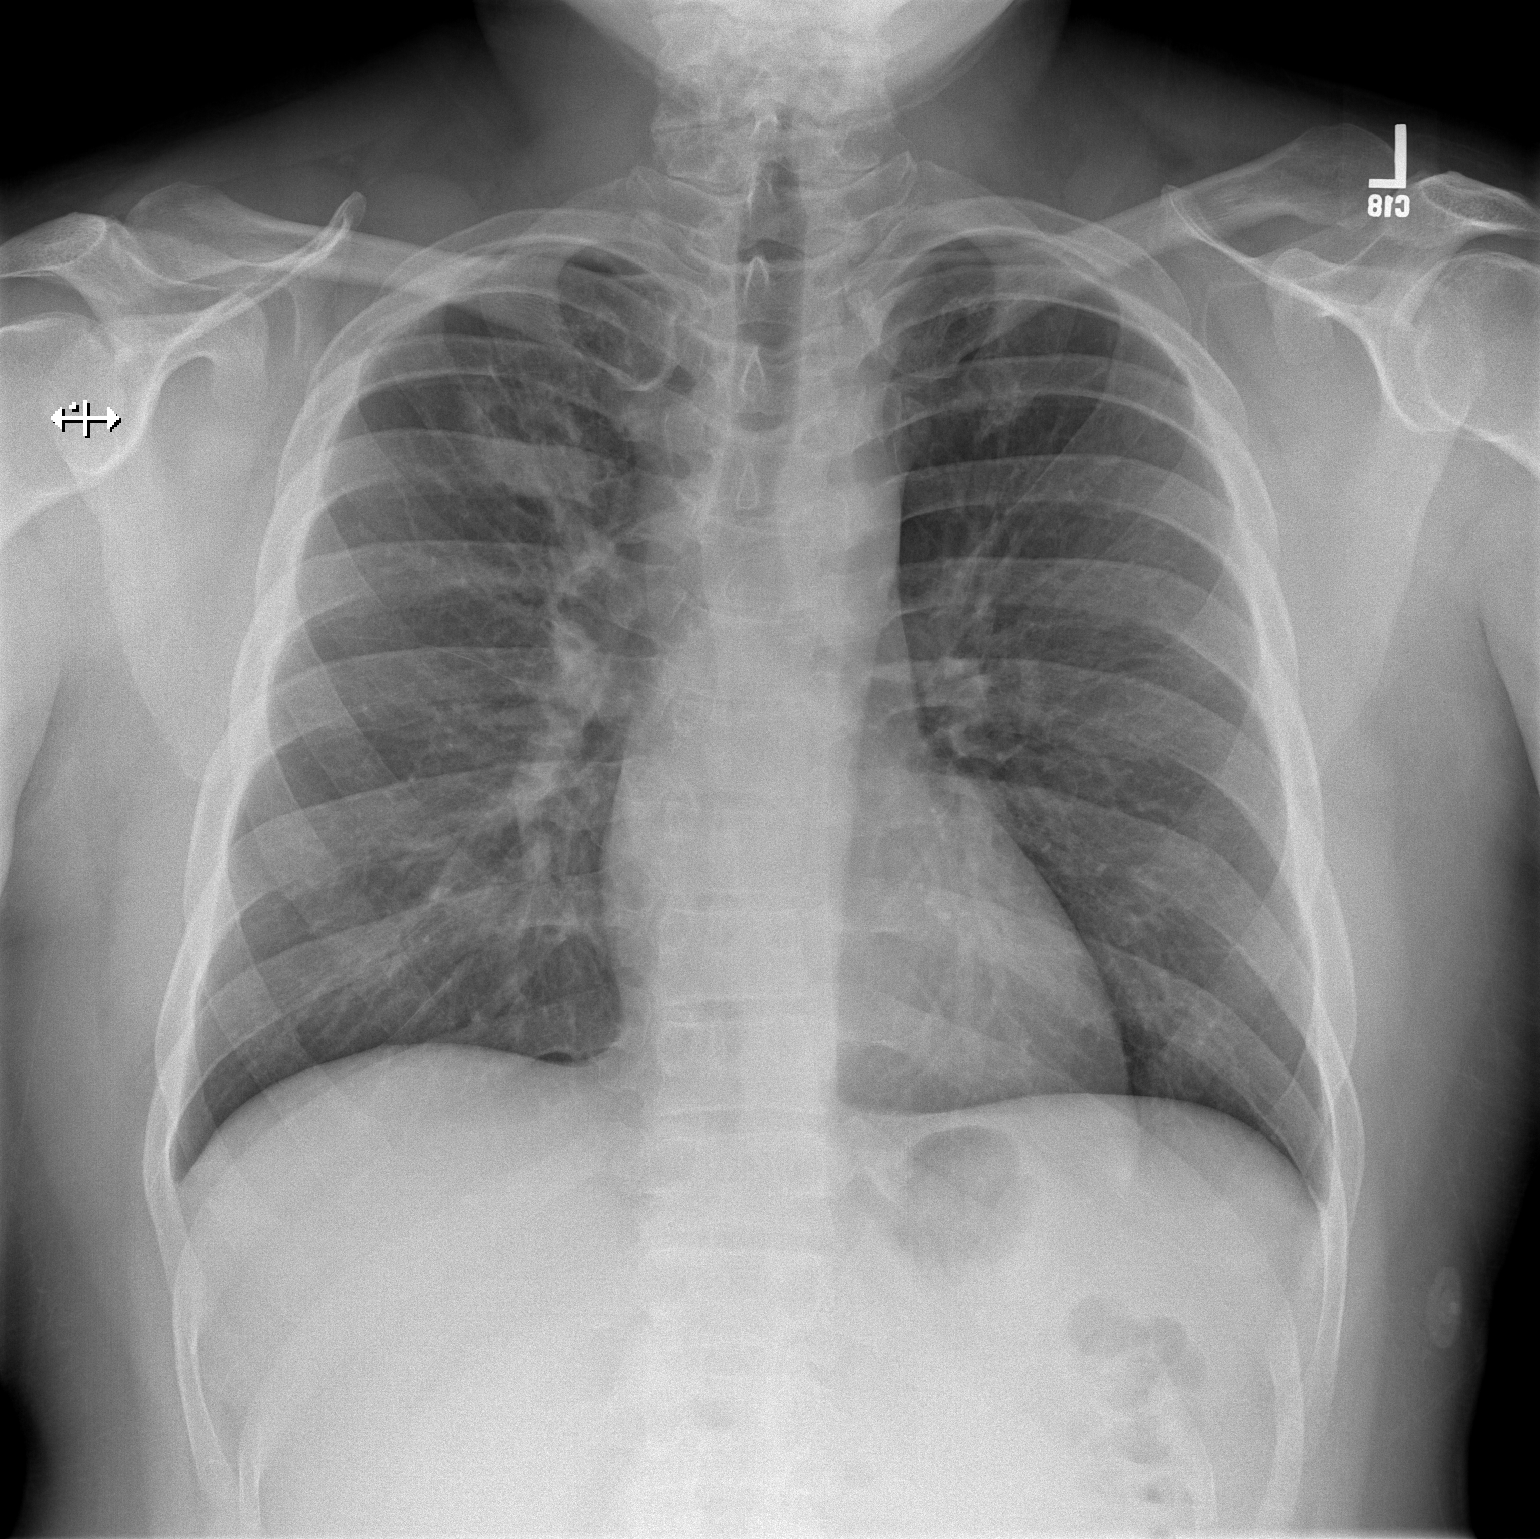

[w chest lat]
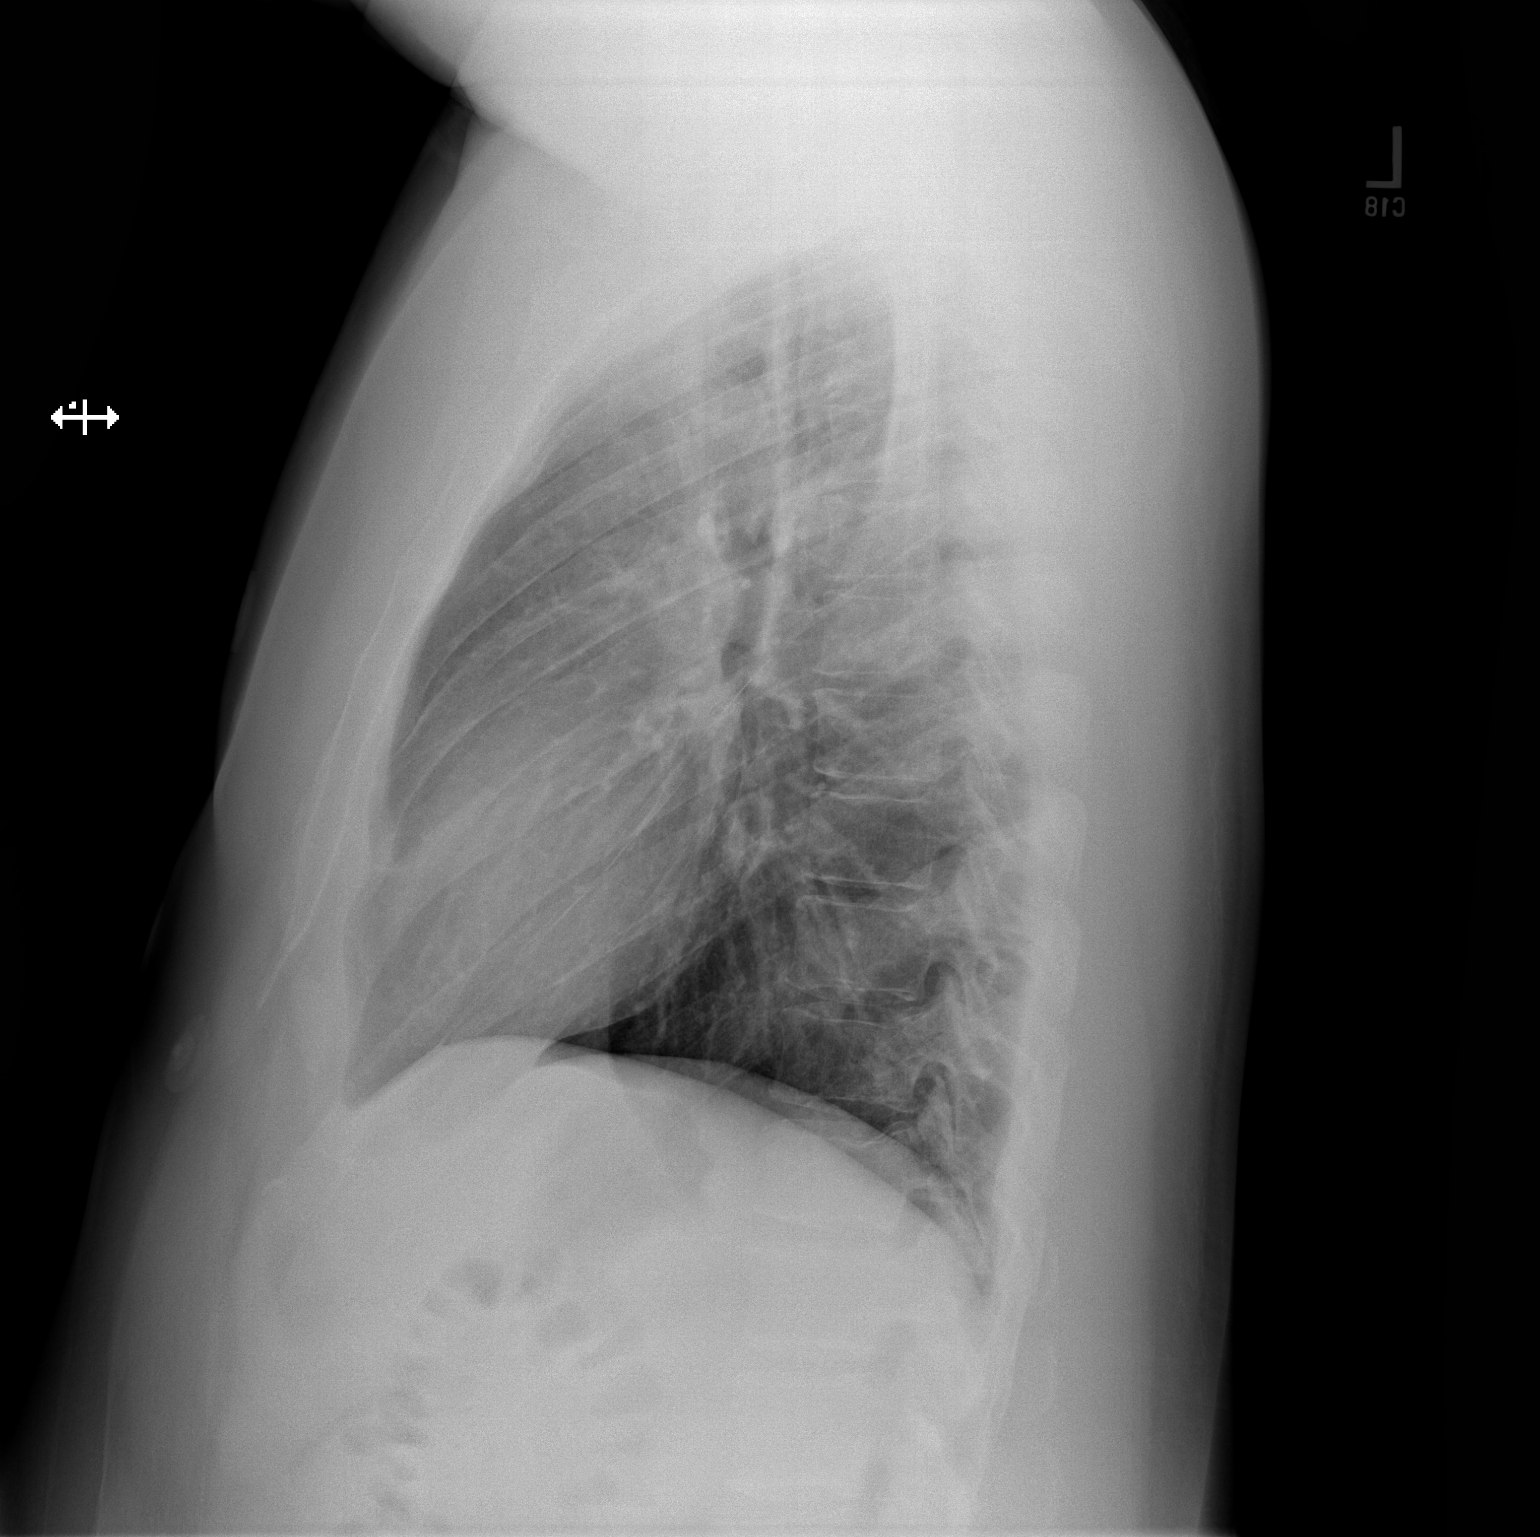

[2 of 2 positions shown; findings below may reference images not displayed]

FINDINGS: Small opacity of the right upper lobe. Lungs are otherwise clear. No
pleural effusion or pneumothorax. Cardiomediastinal contours are
within normal limits. There is no acute osseous abnormality.
IMPRESSION: Small right upper lobe opacity suspicious for pneumonia. Recommend
repeat radiograph post treatment.

## 2019-05-06 IMAGING — CR DG LUMBAR SPINE COMPLETE 4+V
5 series · 5 of 5 positions shown · non-contrast
Comparison: [6X]

CLINICAL DATA: Low back pain radiating into left extremity

EXAM:
LUMBAR SPINE - COMPLETE 4+ VIEW

[t lumbar spine ap]
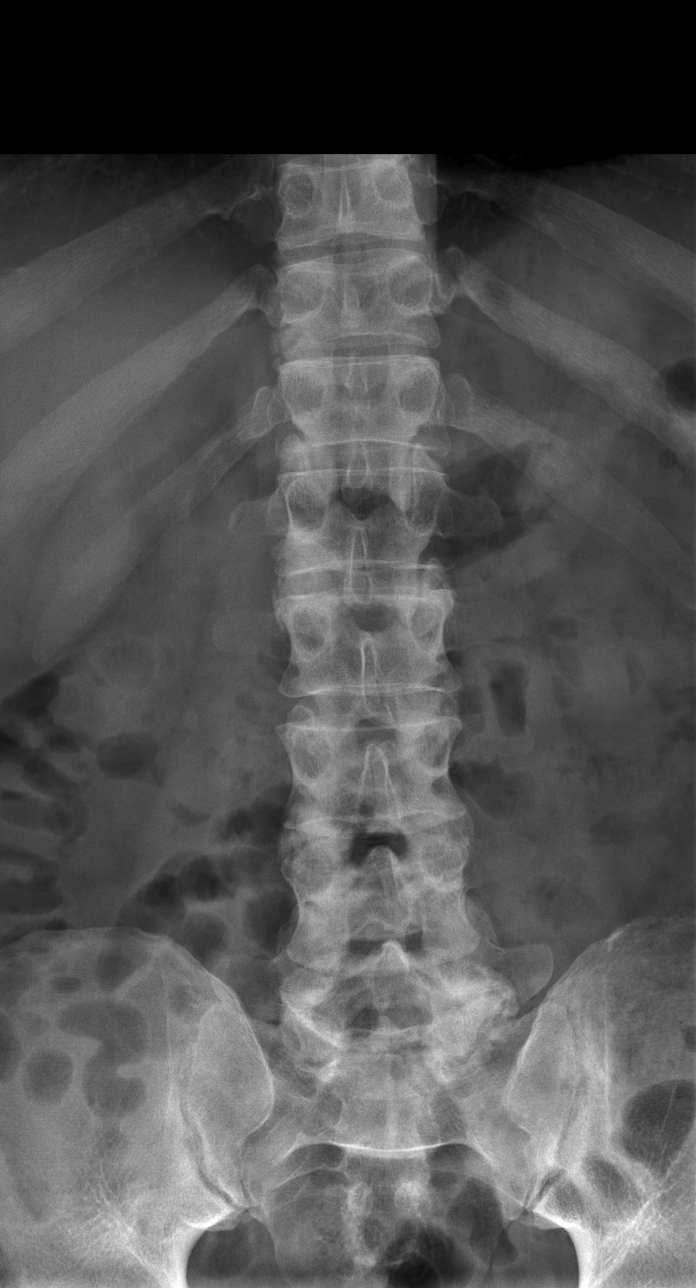

[t lumbar spine obl (1 of 2)]
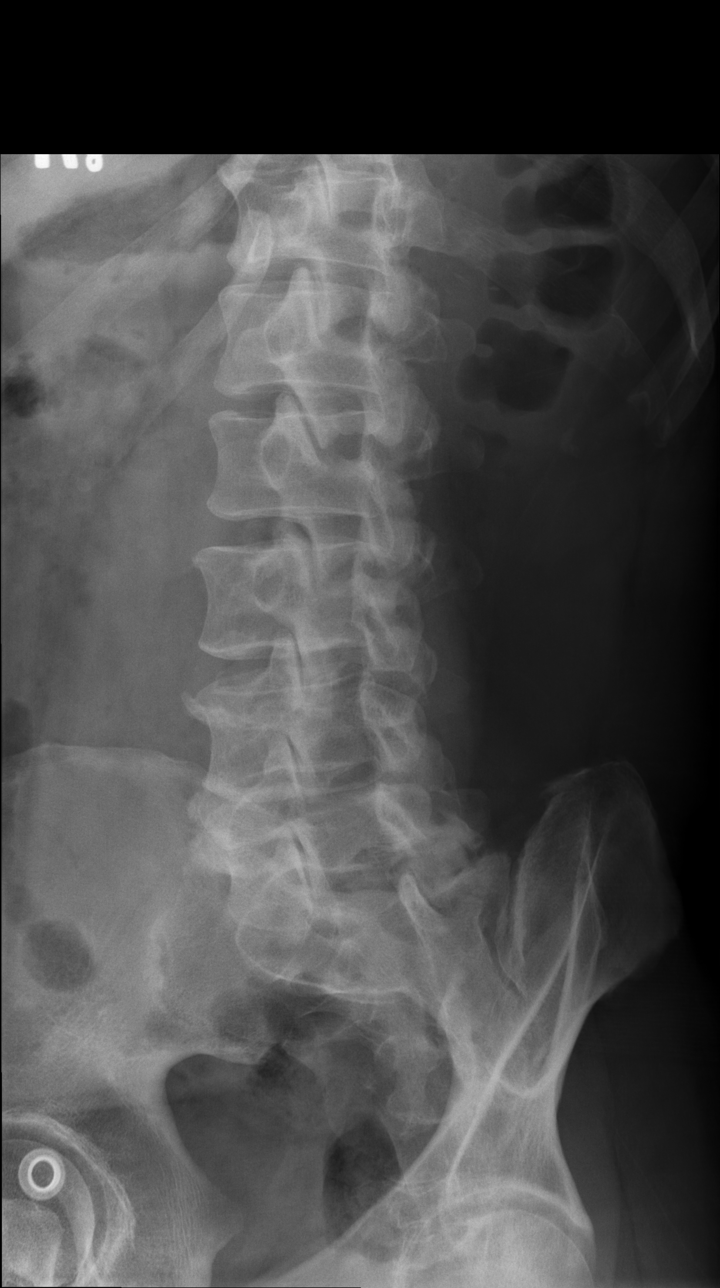

[t lumbar spine obl (2 of 2)]
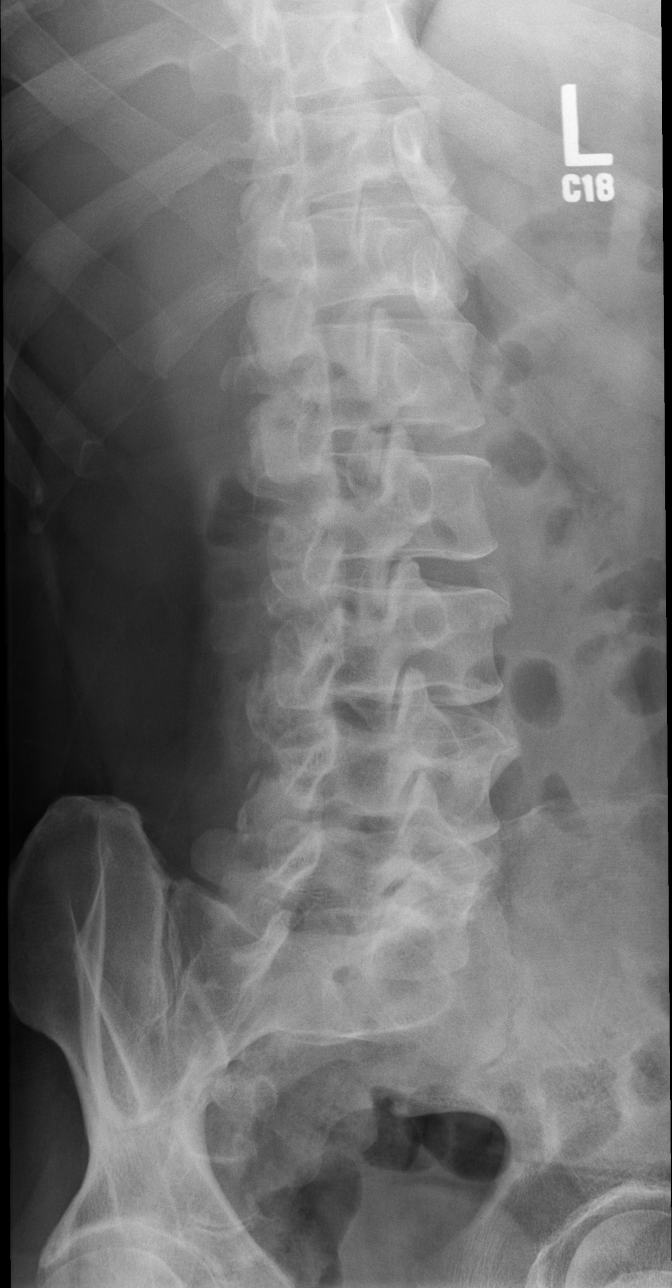

[t lumbar spine lat]
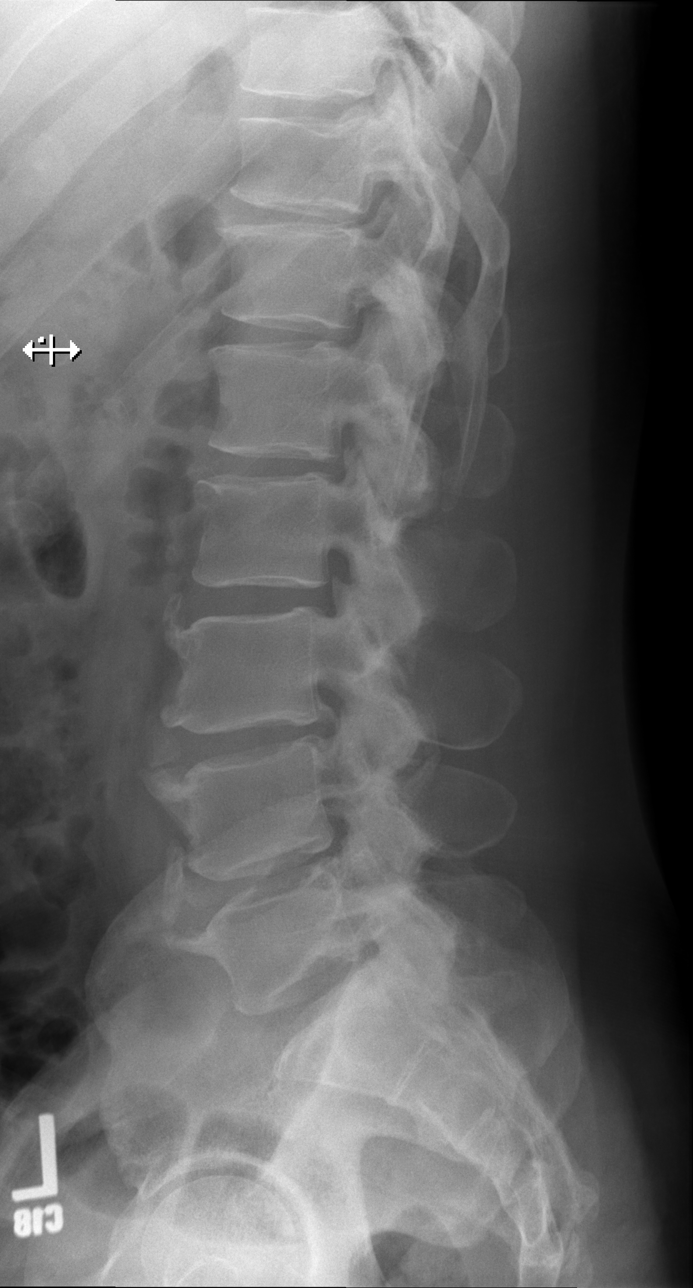

[t lumbar l-5 s-1 spot]
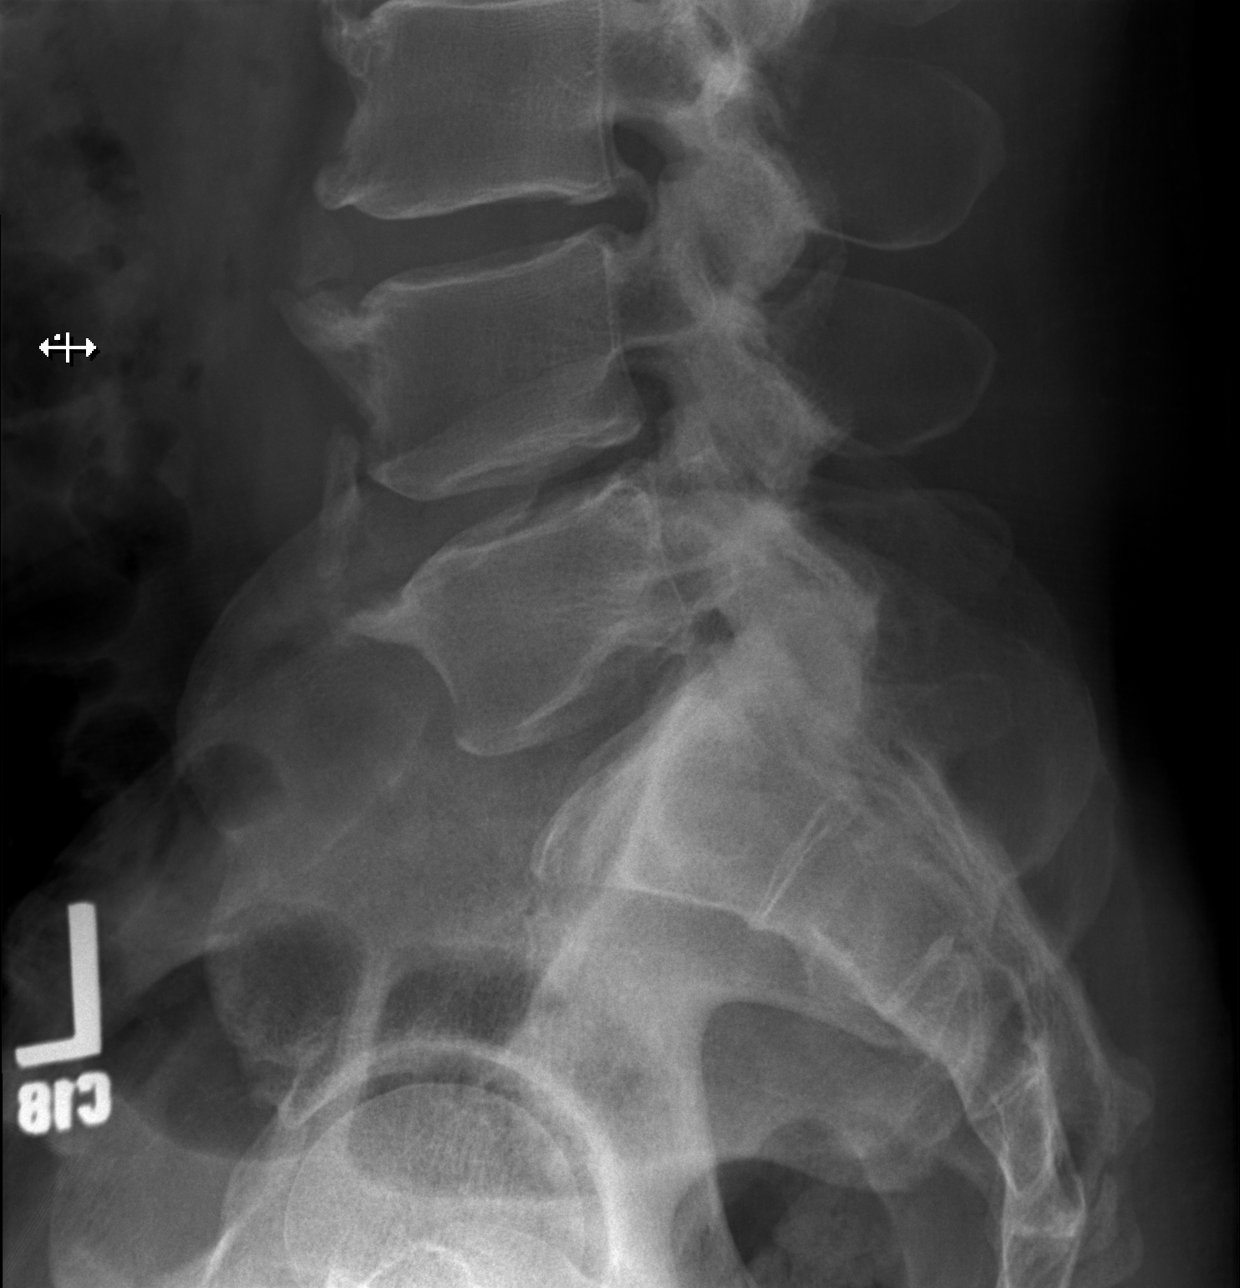

[5 of 5 positions shown; findings below may reference images not displayed]

FINDINGS: Stable vertebral body heights. Stable to slightly increased
retrolisthesis at L2-L3, L3-L4, and L4-L5. Multilevel facet
hypertrophy has progressed. Mild disc space narrowing is stable to
mildly progressed. Congenital narrowing of the spinal canal. No
evidence of spondylolysis.
IMPRESSION: Progression of degenerative changes since [6X] superimposed on
congenital spinal canal narrowing.

## 2019-05-06 MED ORDER — TRAMADOL HCL 50 MG PO TABS: 50.0000 mg | ORAL_TABLET | Freq: Four times a day (QID) | ORAL | 0 refills | Status: DC | PRN

## 2019-05-06 NOTE — Discharge Instructions (Addendum)
Follow-up with your family doctor in the next month for recheck

## 2019-05-06 NOTE — ED Provider Notes (Signed)
[facility] DEPT Provider Note   CSN: [id number] Arrival date & time: 05/06/19  1004     History Chief Complaint  Patient presents with  . Chest Pain  . Back Pain    William Ali is a 34 y.o. male.  Patient states he was just started on amoxicillin for possible respiratory infection when he had a bad cough and now he started having some chest discomfort with movement and also pain in his lower back radiating down the left leg the pain is moderate  The history is provided by the patient and medical records. No language interpreter was used.  Chest Pain Pain location:  L chest Pain quality: aching   Pain radiates to:  Does not radiate Pain severity:  Mild Onset quality:  Sudden Timing:  Intermittent Progression:  Waxing and waning Chronicity:  New Context: not breathing   Relieved by:  Nothing Worsened by:  Nothing Ineffective treatments:  None tried Associated symptoms: back pain   Associated symptoms: no abdominal pain, no cough, no fatigue and no headache   Back Pain Associated symptoms: chest pain   Associated symptoms: no abdominal pain and no headaches        Past Medical History:  Diagnosis Date  . Back pain   . Hypertension     There are no problems to display for this patient.   Past Surgical History:  Procedure Laterality Date  . KNEE SURGERY         Family History  Problem Relation Age of Onset  . Hypertension Mother   . Diabetes Father     Social History   Tobacco Use  . Smoking status: Never Smoker  . Smokeless tobacco: Never Used  Substance Use Topics  . Alcohol use: No  . Drug use: No    Home Medications Prior to Admission medications   Medication Sig Start Date End Date Taking? Authorizing Provider  amoxicillin-clavulanate (AUGMENTIN) 875-125 MG tablet Take 1 tablet by mouth 2 (two) times daily for 10 days. 05/05/19 05/15/19 Yes Darr, Marguerita Beards, PA-C  benzonatate (TESSALON) 100 MG capsule Take 1  capsule (100 mg total) by mouth every 8 (eight) hours. 05/05/19  Yes Darr, Marguerita Beards, PA-C  hydrochlorothiazide (HYDRODIURIL) 25 MG tablet Take 1 tablet (25 mg total) by mouth daily. 07/21/18  Yes Lamptey, Myrene Galas, MD  pantoprazole (PROTONIX) 40 MG tablet Take 40 mg by mouth daily. 04/11/19  Yes [provider]  sodium chloride (OCEAN) 0.65 % SOLN nasal spray Place 1 spray into both nostrils as needed for congestion. 05/05/19  Yes Darr, Marguerita Beards, PA-C  azithromycin (ZITHROMAX Z-PAK) 250 MG tablet 2 po day one, then 1 daily x 4 days Patient not taking: Reported on 05/06/2019 04/23/19   Veryl Speak, MD  butalbital-acetaminophen-caffeine (FIORICET) [phone] MG tablet Take 1 tablet by mouth every 6 (six) hours as needed for headache. Patient not taking: Reported on 05/06/2019 05/28/18 05/28/19  Scot Jun, FNP  cetirizine (ZYRTEC) 10 MG tablet Take 1 tablet (10 mg total) by mouth daily. Patient not taking: Reported on 05/06/2019 11/23/18   Augusto Gamble B, NP  cyclobenzaprine (FLEXERIL) 10 MG tablet Take 1 tablet by mouth 3 times daily as needed for muscle spasm. Warning: May cause drowsiness. Patient not taking: Reported on 05/06/2019 02/07/19   Vanessa Kick, MD  fluticasone [facility]) 50 MCG/ACT nasal spray Place 1 spray into both nostrils daily. Patient not taking: Reported on 05/06/2019 11/23/18   Zigmund Gottron, NP  ibuprofen (  ADVIL) 800 MG tablet Take 1 tablet (800 mg total) by mouth 3 (three) times daily with meals. Patient not taking: Reported on 05/06/2019 02/07/19   Vanessa Kick, MD  meloxicam (MOBIC) 15 MG tablet Take 1 tablet (15 mg total) by mouth daily. Patient not taking: Reported on 05/06/2019 02/17/19   Recardo Evangelist, PA-C  traMADol (ULTRAM) 50 MG tablet Take 1 tablet (50 mg total) by mouth every 6 (six) hours as needed. 05/06/19   Milton Ferguson, MD  amLODipine (NORVASC) 5 MG tablet Take 1 tablet (5 mg total) by mouth daily. 04/21/18 07/21/18  Robyn Haber, MD  famotidine  (PEPCID) 20 MG tablet Take 1 tablet (20 mg total) by mouth 2 (two) times daily. Patient taking differently: Take 20 mg by mouth 2 (two) times daily as needed for heartburn.  04/21/18 07/21/18  Robyn Haber, MD    Allergies    Onion, Other, Peanut oil, and Peanut-containing drug products  Review of Systems   Review of Systems  Constitutional: Negative for appetite change and fatigue.  HENT: Negative for congestion, ear discharge and sinus pressure.   Eyes: Negative for discharge.  Respiratory: Negative for cough.   Cardiovascular: Positive for chest pain.  Gastrointestinal: Negative for abdominal pain and diarrhea.  Genitourinary: Negative for frequency and hematuria.  Musculoskeletal: Positive for back pain.  Skin: Negative for rash.  Neurological: Negative for seizures and headaches.  Psychiatric/Behavioral: Negative for hallucinations.    Physical Exam Updated Vital Signs BP (!) 144/80   Pulse 78   Temp 97.9 F (36.6 C) (Oral)   Resp (!) 23   Ht 5\' 7"  (1.702 m)   Wt 104.3 kg   SpO2 97%   BMI 36.02 kg/m   Physical Exam Vitals and nursing note reviewed.  Constitutional:      Appearance: He is well-developed.  HENT:     Head: Normocephalic.     Nose: Nose normal.  Eyes:     General: No scleral icterus.    Conjunctiva/sclera: Conjunctivae normal.  Neck:     Thyroid: No thyromegaly.  Cardiovascular:     Rate and Rhythm: Normal rate and regular rhythm.     Heart sounds: No murmur. No friction rub. No gallop.   Pulmonary:     Breath sounds: No stridor. No wheezing or rales.  Chest:     Chest wall: Tenderness present.  Abdominal:     General: There is no distension.     Tenderness: There is no abdominal tenderness. There is no rebound.  Musculoskeletal:        General: Normal range of motion.     Cervical back: Neck supple.     Comments: Lumbar spine tenderness with straight leg raise on the left  Lymphadenopathy:     Cervical: No cervical adenopathy.   Skin:    Findings: No erythema or rash.  Neurological:     Mental Status: He is alert and oriented to person, place, and time.     Motor: No abnormal muscle tone.     Coordination: Coordination normal.  Psychiatric:        Behavior: Behavior normal.     ED Results / Procedures / Treatments   Labs (all labs ordered are listed, but only abnormal results are displayed) Labs Reviewed - No data to display  EKG None  Radiology DG Chest 2 View  Result Date: 05/06/2019 CLINICAL DATA:  Chest pain EXAM: CHEST - 2 VIEW COMPARISON:  05/01/2018 FINDINGS: Small opacity of the right upper lobe.  Lungs are otherwise clear. No pleural effusion or pneumothorax. Cardiomediastinal contours are within normal limits. There is no acute osseous abnormality. IMPRESSION: Small right upper lobe opacity suspicious for pneumonia. Recommend repeat radiograph post treatment. Electronically Signed   By: Macy Mis M.D.   On: 05/06/2019 11:56   DG Lumbar Spine Complete  Result Date: 05/06/2019 CLINICAL DATA:  Low back pain radiating into left extremity EXAM: LUMBAR SPINE - COMPLETE 4+ VIEW COMPARISON:  2012 FINDINGS: Stable vertebral body heights. Stable to slightly increased retrolisthesis at L2-L3, L3-L4, and L4-L5. Multilevel facet hypertrophy has progressed. Mild disc space narrowing is stable to mildly progressed. Congenital narrowing of the spinal canal. No evidence of spondylolysis. 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 Electronically Signed   By: Macy Mis M.D.   On: 05/06/2019 12:43    Procedures Procedures (including critical care time)  Medications Ordered in ED Medications - No data to display  ED Course  I have reviewed the triage vital signs and the nursing notes.  Pertinent labs & imaging results that were available during my care of the patient were reviewed by me and considered in my medical decision making (see  chart for details).    MDM Rules/Calculators/A&P                     Patient with musculoskeletal discomfort in his chest and back pain with radiculopathy.  Chest x-ray shows pneumonia and he will continue his antibiotic.  Lumbar spine does show some narrowing.  Patient given pain medicine will follow-up with PCP     This patient presents to the ED for concern of back pain and chest pain, this involves an extensive number of treatment options, and is a complaint that carries with it a high risk of complications and morbidity.  The differential diagnosis includes tumors muscle skeletal   Lab Tests:   Medicines ordered:     Imaging Studies ordered:   I ordered imaging studies which included chest x-ray lumbar spine and  I independently visualized and interpreted imaging which showed possible pneumonia on chest x-ray and narrowing of his lumbar spine.  Additional history obtained:   Additional history obtained from chart  Previous records obtained and reviewed   Consultations Obtained:   Reevaluation:   Critical Interventions:  .   Final Clinical Impression(s) / ED Diagnoses Final diagnoses:  Chest wall pain    Rx / DC Orders ED Discharge Orders         Ordered    traMADol (ULTRAM) 50 MG tablet  Every 6 hours PRN     05/06/19 1319           Milton Ferguson, MD 05/06/19 1323

## 2019-05-06 NOTE — ED Triage Notes (Signed)
Pt complains of cough, chest pain, back pain, headaches x 3 weeks. Pt was treated for bronchitis, states he did not improve. Pt went to urgent care yesterday, got cough medicine and amoxicillin. He threw up last night.

## 2019-05-10 ENCOUNTER — Emergency Department (HOSPITAL_COMMUNITY): Payer: Self-pay

## 2019-05-10 ENCOUNTER — Emergency Department (HOSPITAL_COMMUNITY)
Admission: EM | Admit: 2019-05-10 | Discharge: 2019-05-10 | Disposition: A | Discharge status: Home / Self Care | Payer: Self-pay | Attending: Emergency Medicine | Admitting: Emergency Medicine

## 2019-05-10 ENCOUNTER — Other Ambulatory Visit: Payer: Self-pay

## 2019-05-10 ENCOUNTER — Other Ambulatory Visit (HOSPITAL_COMMUNITY): Payer: Self-pay

## 2019-05-10 ENCOUNTER — Encounter (HOSPITAL_COMMUNITY): Payer: Self-pay

## 2019-05-10 DIAGNOSIS — H538 Other visual disturbances: Secondary | ICD-10-CM

## 2019-05-10 DIAGNOSIS — R102 Pelvic and perineal pain unspecified side: Secondary | ICD-10-CM

## 2019-05-10 DIAGNOSIS — M542 Cervicalgia: Secondary | ICD-10-CM | POA: Insufficient documentation

## 2019-05-10 DIAGNOSIS — M549 Dorsalgia, unspecified: Secondary | ICD-10-CM | POA: Insufficient documentation

## 2019-05-10 DIAGNOSIS — I10 Essential (primary) hypertension: Secondary | ICD-10-CM | POA: Insufficient documentation

## 2019-05-10 DIAGNOSIS — Z79899 Other long term (current) drug therapy: Secondary | ICD-10-CM | POA: Insufficient documentation

## 2019-05-10 DIAGNOSIS — G4459 Other complicated headache syndrome: Secondary | ICD-10-CM

## 2019-05-10 DIAGNOSIS — R531 Weakness: Secondary | ICD-10-CM | POA: Insufficient documentation

## 2019-05-10 DIAGNOSIS — R52 Pain, unspecified: Secondary | ICD-10-CM

## 2019-05-10 LAB — COMPREHENSIVE METABOLIC PANEL
ALT: 66 U/L — ABNORMAL HIGH (ref 0–44)
AST: 51 U/L — ABNORMAL HIGH (ref 15–41)
Albumin: 4.3 g/dL (ref 3.5–5.0)
Alkaline Phosphatase: 261 U/L — ABNORMAL HIGH (ref 38–126)
Anion gap: 12 (ref 5–15)
BUN: 15 mg/dL (ref 6–20)
CO2: 27 mmol/L (ref 22–32)
Calcium: 9.8 mg/dL (ref 8.9–10.3)
Chloride: 100 mmol/L (ref 98–111)
Creatinine, Ser: 1.37 mg/dL — ABNORMAL HIGH (ref 0.61–1.24)
GFR calc Af Amer: >60 mL/min (ref >60)
GFR calc non Af Amer: >60 mL/min (ref >60)
Glucose, Bld: 99 mg/dL (ref 70–99)
Potassium: 4.5 mmol/L (ref 3.5–5.1)
Sodium: 139 mmol/L (ref 135–145)
Total Bilirubin: 1 mg/dL (ref 0.3–1.2)
Total Protein: 8.5 g/dL — ABNORMAL HIGH (ref 6.5–8.1)

## 2019-05-10 LAB — CBC WITH DIFFERENTIAL/PLATELET
Abs Immature Granulocytes: 0.03 10*3/uL (ref 0.00–0.07)
Basophils Absolute: 0.1 10*3/uL (ref 0.0–0.1)
Basophils Relative: 1 %
Eosinophils Absolute: 0.1 10*3/uL (ref 0.0–0.5)
Eosinophils Relative: 1 %
HCT: 44.5 % (ref 39.0–52.0)
Hemoglobin: 13.1 g/dL (ref 13.0–17.0)
Immature Granulocytes: 0 %
Lymphocytes Relative: 11 %
Lymphs Abs: 1.3 10*3/uL (ref 0.7–4.0)
MCH: 26.7 pg (ref 26.0–34.0)
MCHC: 29.4 g/dL — ABNORMAL LOW (ref 30.0–36.0)
MCV: 90.6 fL (ref 80.0–100.0)
Monocytes Absolute: 0.7 10*3/uL (ref 0.1–1.0)
Monocytes Relative: 6 %
Neutro Abs: 9.7 10*3/uL — ABNORMAL HIGH (ref 1.7–7.7)
Neutrophils Relative %: 81 %
Platelets: 325 10*3/uL (ref 150–400)
RBC: 4.91 MIL/uL (ref 4.22–5.81)
RDW: 14.7 % (ref 11.5–15.5)
WBC: 11.9 10*3/uL — ABNORMAL HIGH (ref 4.0–10.5)
nRBC: 0 % (ref 0.0–0.2)

## 2019-05-10 IMAGING — MR MR HEAD W/ CM
13 of 14 series · 41 of 48 positions shown · IV contrast (gadavist)
Comparison: None.

CLINICAL DATA: Left retinal lesion, headaches

EXAM:
MRI HEAD AND ORBITS WITHOUT AND WITH CONTRAST
TECHNIQUE: Multiplanar, multiecho pulse sequences of the brain and surrounding
structures were obtained without and with intravenous contrast.
Multiplanar, multiecho pulse sequences of the orbits and surrounding
structures were obtained including fat saturation techniques, before
and after intravenous contrast administration.
CONTRAST:  10mL GADAVIST GADOBUTROL 1 MMOL/ML IV SOLN

[Series 5: T2 · sagittal · 5.0mm · 0.47mm/px · 3 of 24 slices shown (1 of 3)]
[im 1/24]
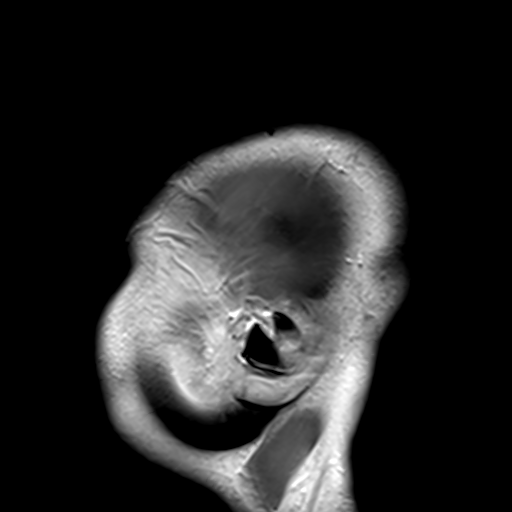
[im 12/24]
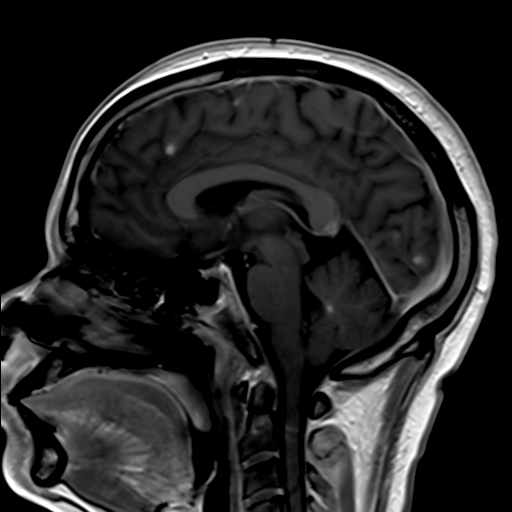
[im 24/24]
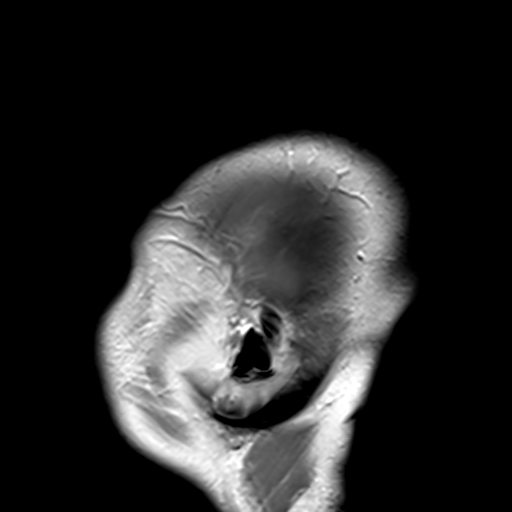

[Series 6: T2 · axial · 5.0mm · 0.45mm/px · z∈[-54,+95]mm · 2 of 24 slices shown (2 of 3)]
[im 1/24]
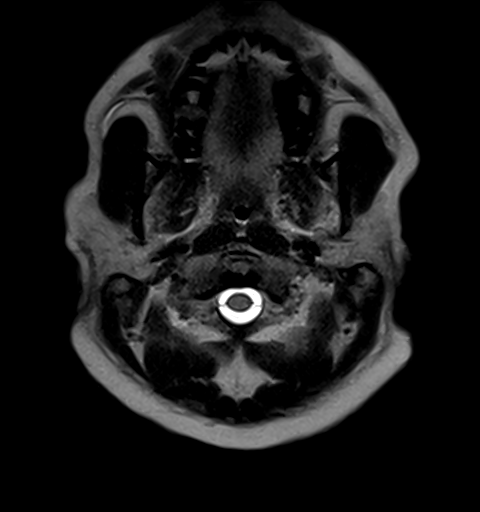
[im 24/24]
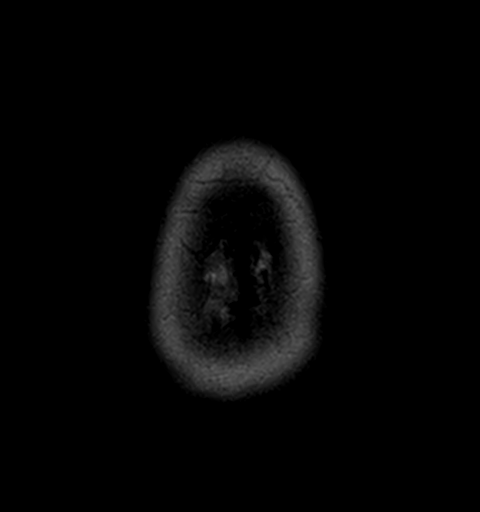

[Series 7: dwi_tracew · axial · 3.0mm · 1.08mm/px · z∈[-57,+57]mm · 6 of 101 slices shown]
[im 1/101]
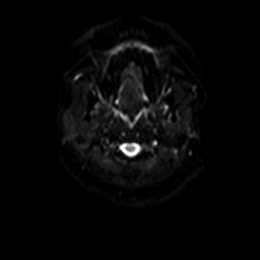
[im 13/101]
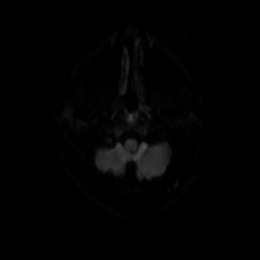
[im 26/101]
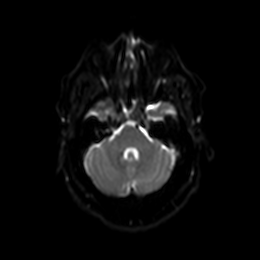
[im 38/101]
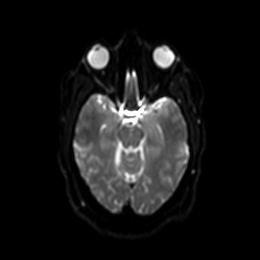
[im 63/101]
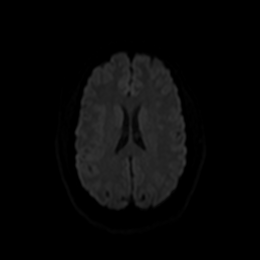
[im 76/101]
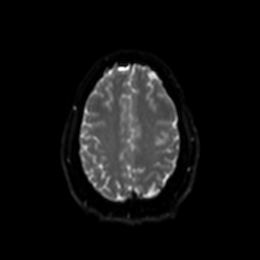

[Series 9: GRE · axial · 3.0mm · 0.45mm/px · z∈[-53,+97]mm · 4 of 51 slices shown]
[im 1/51]
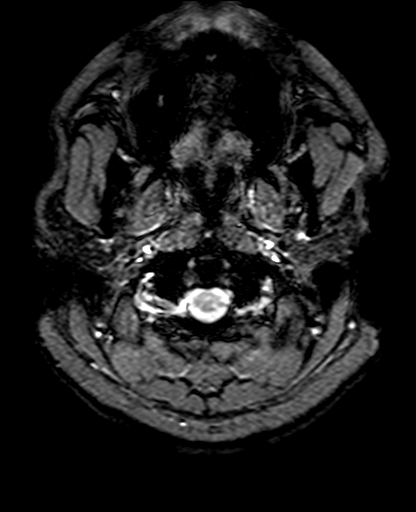
[im 17/51]
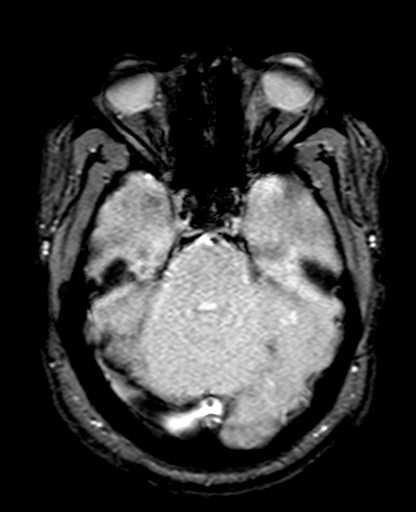
[im 34/51]
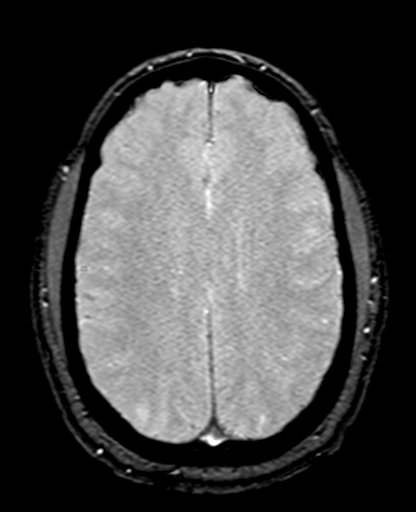
[im 51/51]
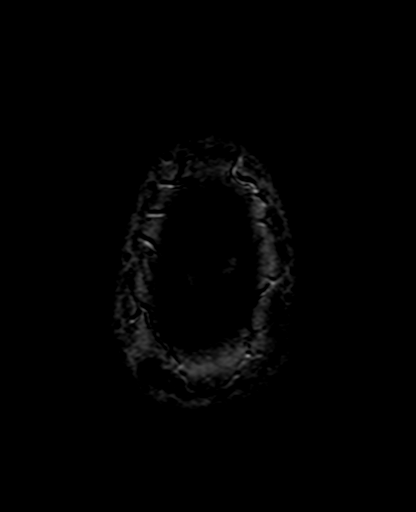

[Series 10: FLAIR · axial · 3.0mm · 0.86mm/px · z∈[-53,+97]mm · 4 of 51 slices shown]
[im 1/51]
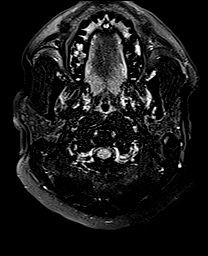
[im 17/51]
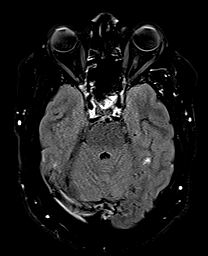
[im 34/51]
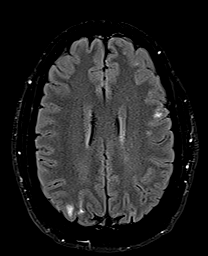
[im 51/51]
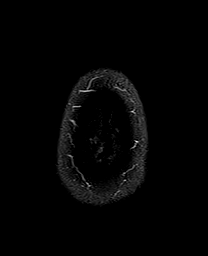

[Series 11: T2 fat-sat · axial · 3.0mm · 0.47mm/px · 1 of 17 slices shown (1 of 2)]
[im 1/17]
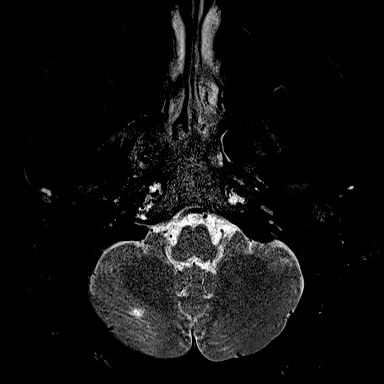

[Series 12: T1 · axial · 3.0mm · 0.70mm/px · 1 of 17 slices shown (1 of 3)]
[im 1/17]
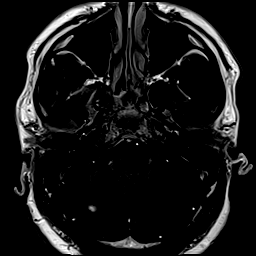

[Series 13: T1 · axial · 3.0mm · 0.45mm/px · z∈[-53,+97]mm · 4 of 51 slices shown (2 of 3)]
[im 1/51]
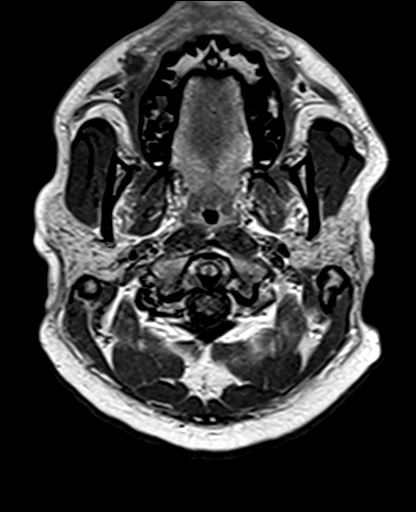
[im 17/51]
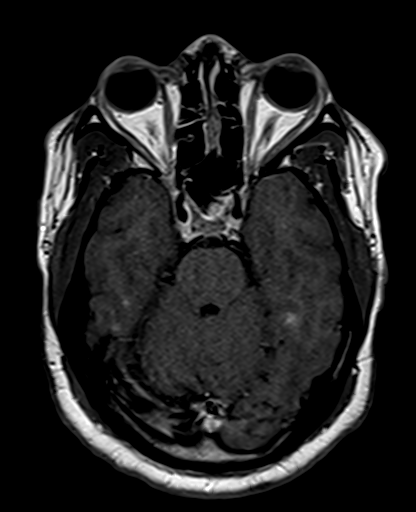
[im 34/51]
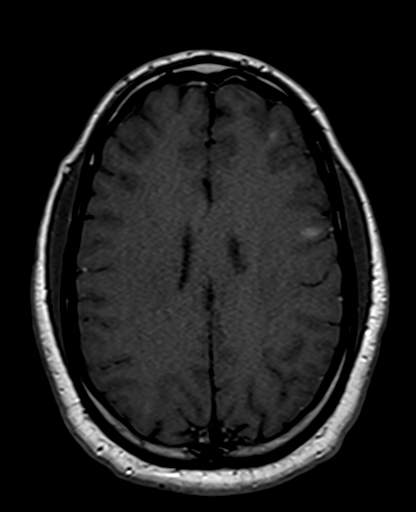
[im 51/51]
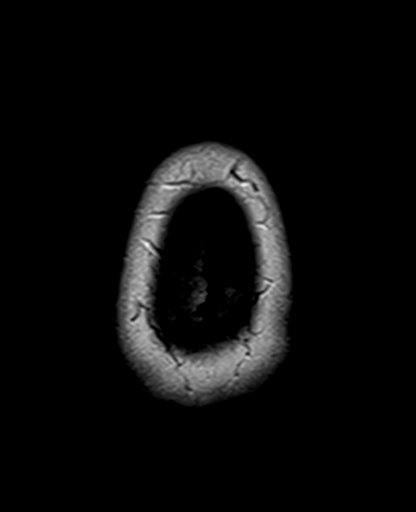

[Series 14: T1 · coronal · 3.0mm · 0.70mm/px · 3 of 32 slices shown (3 of 3)]
[im 1/32]
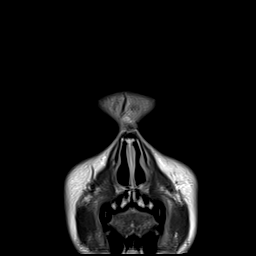
[im 16/32]
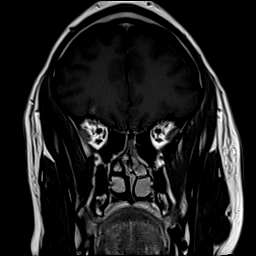
[im 32/32]
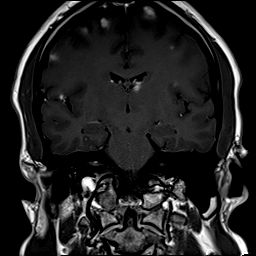

[Series 15: T2 fat-sat · coronal · 3.0mm · 0.70mm/px · 3 of 32 slices shown (2 of 2)]
[im 1/32]
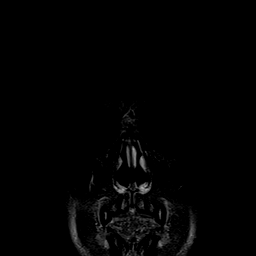
[im 16/32]
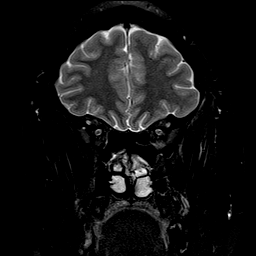
[im 32/32]
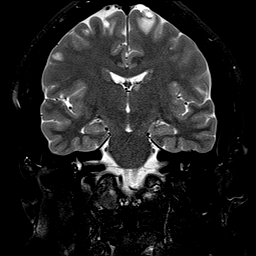

[Series 16: DWI · coronal · 5.0mm · 1.31mm/px · 5 of 60 slices shown (1 of 2)]
[im 1/60]
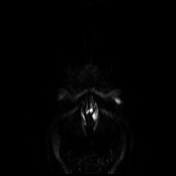
[im 15/60]
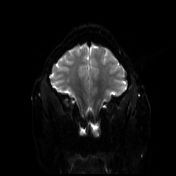
[im 30/60]
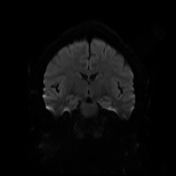
[im 45/60]
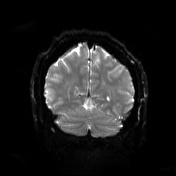
[im 60/60]
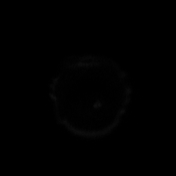

[Series 17: DWI · coronal · 5.0mm · 1.31mm/px · 3 of 30 slices shown (2 of 2)]
[im 1/30]
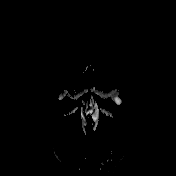
[im 15/30]
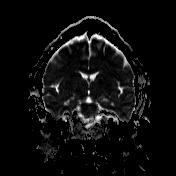
[im 30/30]
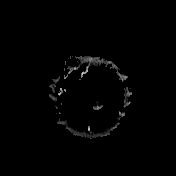

[Series 18: T2 · coronal · 5.0mm · 0.86mm/px · 2 of 28 slices shown (3 of 3)]
[im 1/28]
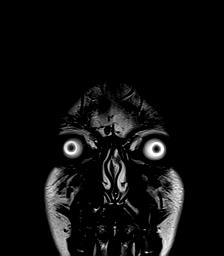
[im 28/28]
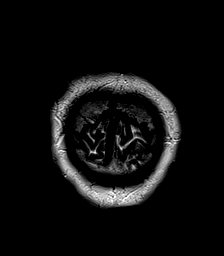

[41 of 48 positions shown; findings below may reference images not displayed]

FINDINGS: Intravenous contrast was injected for MRI of the lumbar spine
shortly before this study. Therefore, contrast remains present and
all images are effectively postcontrast

MRI HEAD FINDINGS

Brain: There are multiple (greater than 30) subcentimeter
parenchymal enhancing lesions including involvement of the
cerebellum and right midbrain tectum. There is mild edema associated
with some of the lesions. A minority also demonstrate corresponding
susceptibility, which may reflect intralesional hemorrhage or
mineralization.

There is no acute infarction. No hydrocephalus. No significant mass
effect.

Vascular: Major vessel flow voids at the skull base are preserved.

Skull and upper cervical spine: Probable areas of abnormal marrow
enhancement at the skull base and C2 posterior elements

Other: Mastoid air cells are clear.

MRI ORBITS FINDINGS

Orbits: No proptosis. Suspected thin enhancing tissue at the
posterior aspect of the left globe. Extraocular muscles are
symmetric and unremarkable. There is no abnormal enhancement of the
optic nerve sheath complexes.

Visualized sinuses: Minor mucosal thickening.

Soft tissues: Unremarkable.
IMPRESSION: Numerous enhancing parenchymal intracranial lesions likely
reflecting metastases. Mild associated edema. No mass effect.

Probable areas of abnormal marrow enhancement reflecting osseous
metastases at the skull base and upper cervical spine.

Suspected abnormal thin enhancing tissue at the posterior aspect of
the left lobe.

## 2019-05-10 IMAGING — MR MR LUMBAR SPINE WO/W CM
5 of 8 series · 28 of 48 positions shown · IV contrast (gadavist)
Comparison: None.
COMPARISON: None.

Addendum:
CLINICAL DATA: Low back pain, left radiculopathy

EXAM:
MRI LUMBAR SPINE WITHOUT AND WITH CONTRAST
TECHNIQUE: Multiplanar and multiecho pulse sequences of the lumbar spine were
obtained without and with intravenous contrast.
CONTRAST:  10mL GADAVIST GADOBUTROL 1 MMOL/ML IV SOLN

[Series 9: T1 · sagittal · 4.0mm · 0.81mm/px · 5 of 17 slices shown (1 of 2)]
[im 1/17]
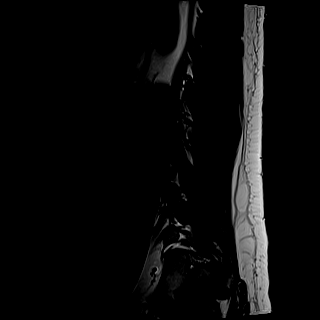
[im 5/17]
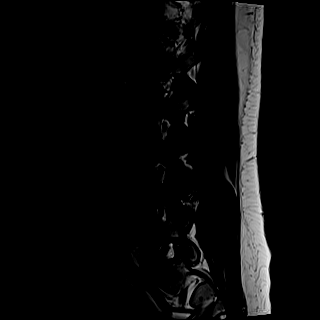
[im 9/17]
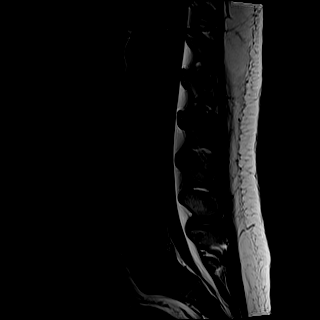
[im 13/17]
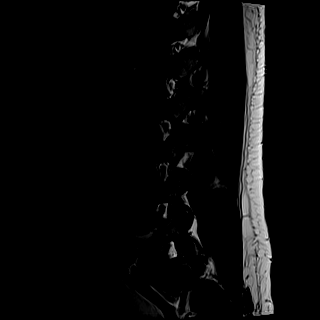
[im 17/17]
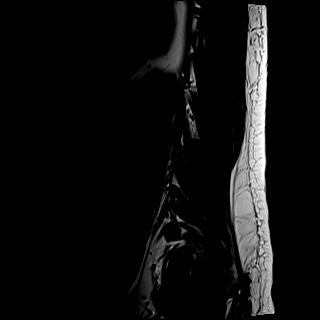

[Series 11: T2 · sagittal · 4.0mm · 0.81mm/px · 4 of 17 slices shown (1 of 2)]
[im 1/17]
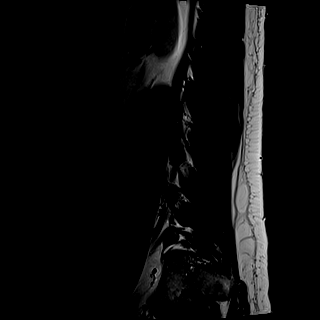
[im 6/17]
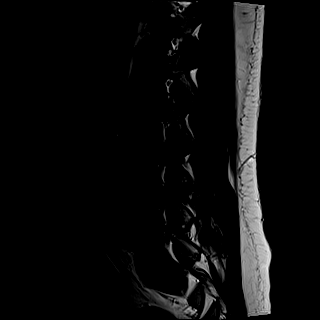
[im 11/17]
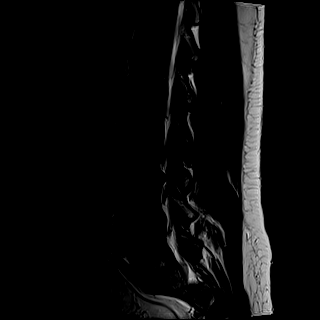
[im 17/17]
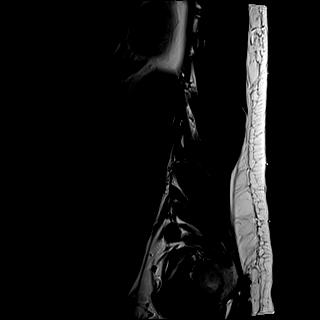

[Series 12: T2 · axial · 4.0mm · 0.62mm/px · z∈[+49,+259]mm · 9 of 40 slices shown (2 of 2)]
[im 1/40]
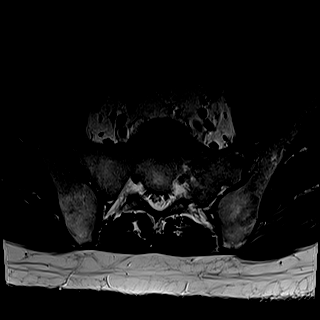
[im 5/40]
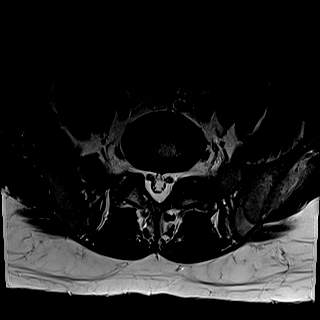
[im 10/40]
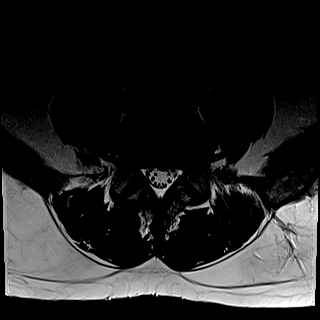
[im 15/40]
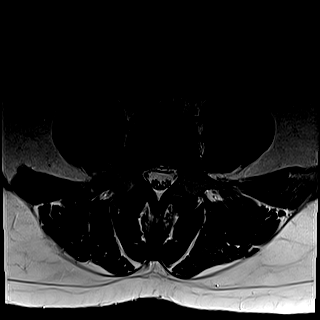
[im 20/40]
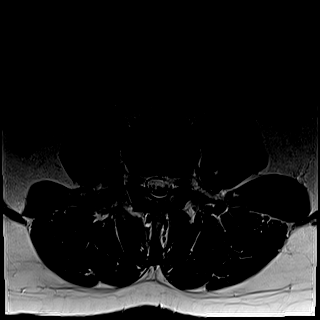
[im 25/40]
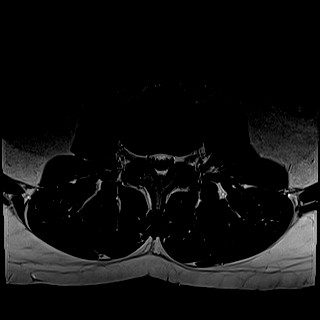
[im 30/40]
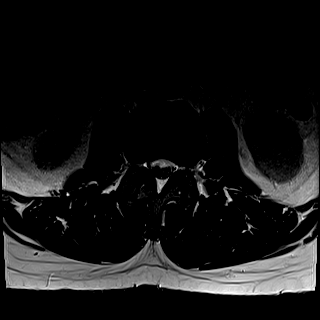
[im 35/40]
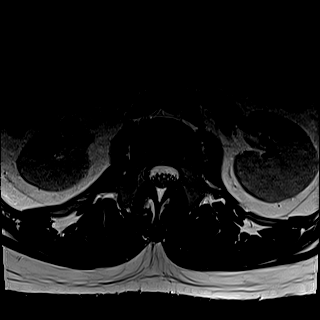
[im 40/40]
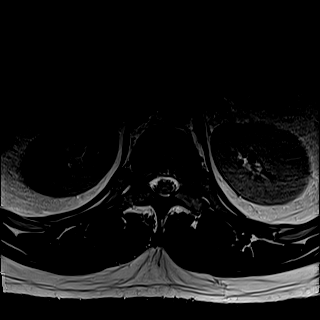

[Series 13: T1 · axial · 4.0mm · 0.43mm/px · z∈[+47,+258]mm · 9 of 40 slices shown (2 of 2)]
[im 1/40]
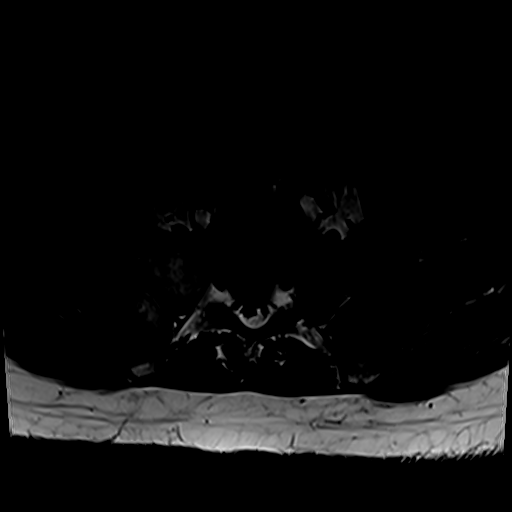
[im 5/40]
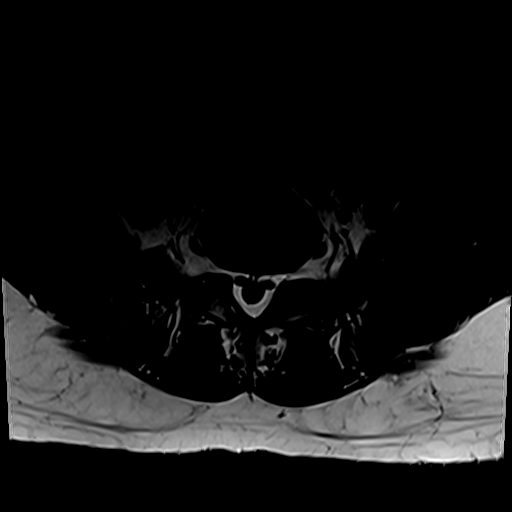
[im 10/40]
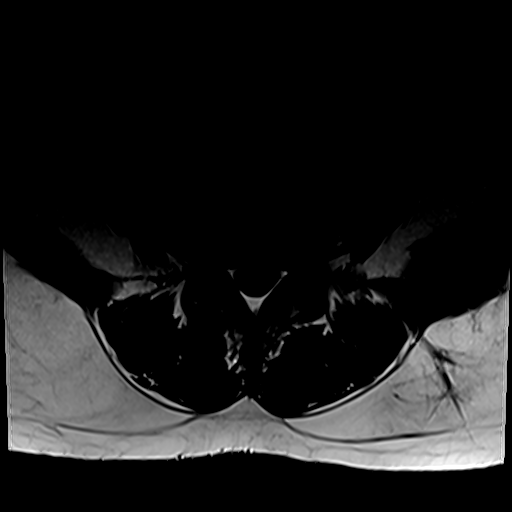
[im 15/40]
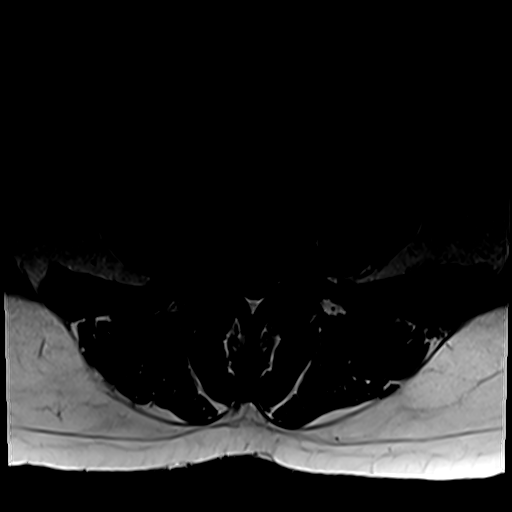
[im 20/40]
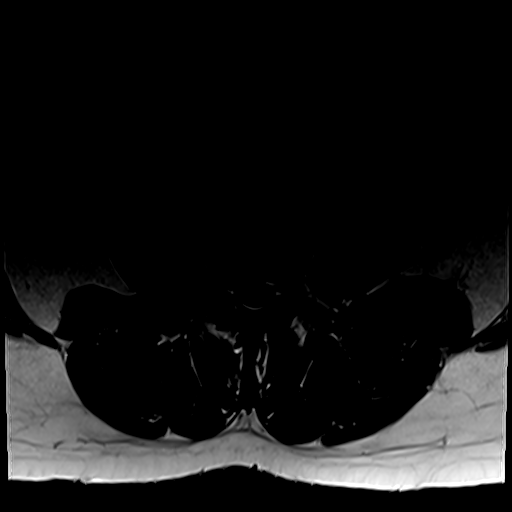
[im 25/40]
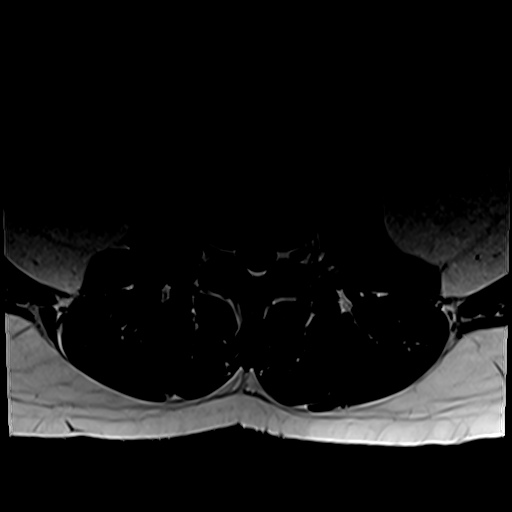
[im 30/40]
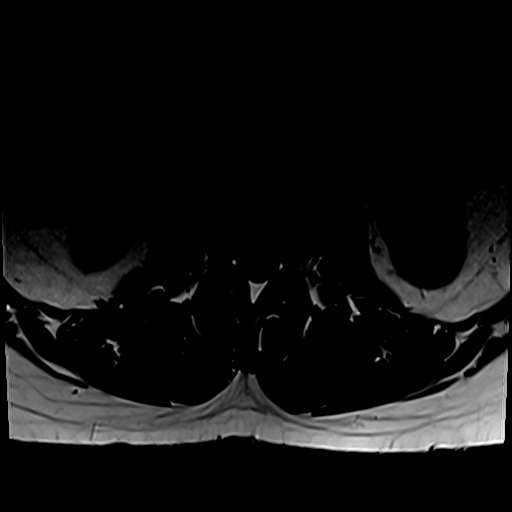
[im 35/40]
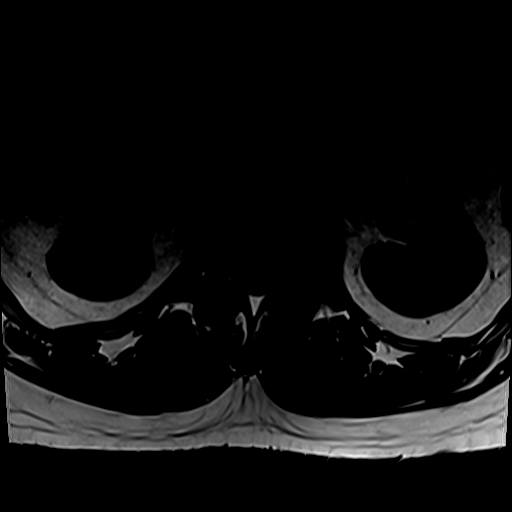
[im 40/40]
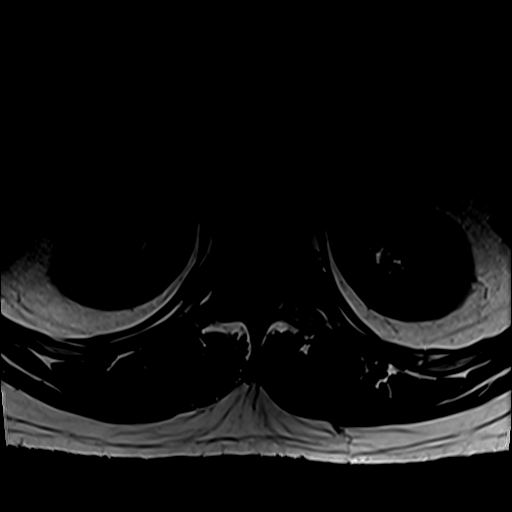

[Series 14: T1 fat-sat post-contrast · sagittal · 4.0mm · 0.81mm/px · 1 of 17 slices shown]
[im 1/17]
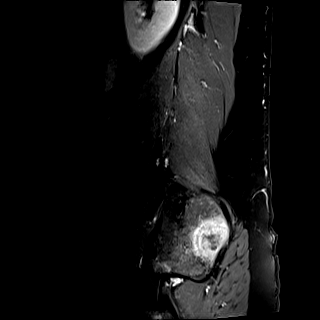

[28 of 48 positions shown; findings below may reference images not displayed]

FINDINGS: Segmentation:  Standard.

Alignment:  Trace retrolisthesis at L3-L4

Vertebrae: Small Schmorl's nodes are present. No compression
deformity. There are STIR hyperintense lesions involving all levels
as well as the bony pelvis. No evidence of significant epidural
disease extension.

Conus medullaris and cauda equina: Conus extends to the L1 level.
Conus and cauda equina appear normal.

Paraspinal and other soft tissues: Unremarkable.

Disc levels: Imaged in the sagittal plane only, there is a small
central disc protrusion and annular fissure at T11-T12.

L1-L2: Mild facet hypertrophy and prominence of dorsal epidural fat.
No significant canal or foraminal stenosis.

L2-L3: Mild facet hypertrophy and prominence of dorsal epidural fat.
No significant canal or foraminal stenosis.

L3-L4: Left subarticular/foraminal disc protrusion. Mild facet
arthropathy with ligamentum flavum infolding and mild prominence of
dorsal epidural fat. No significant canal stenosis. Effacement of
the left lateral recess with compression of the traversing L4 nerve
root. No right foraminal stenosis. Mild left foraminal stenosis.

L4-L5: Mild disc bulge. Mild facet arthropathy with ligamentum
flavum infolding and mild prominence of dorsal epidural fat. No
significant canal or foraminal stenosis.

L5-S1: Mild facet arthropathy and prominence of epidural fat. No
significant canal or foraminal stenosis.
IMPRESSION: Multilevel, multifocal osseous lesions suspicious for metastatic
disease. No compression deformity or epidural disease.

Disc protrusion at L3-L4 compressing the left L4 nerve root.

ADDENDUM:
Postcontrast images were not reviewed initially. There is
enhancement corresponding to STIR hyperintense lesions.
Additionally, there is extraosseous disease encroaching on the right
S1 foramen.

*** End of Addendum ***
FINDINGS: Segmentation:  Standard.

Alignment:  Trace retrolisthesis at L3-L4

Vertebrae: Small Schmorl's nodes are present. No compression
deformity. There are STIR hyperintense lesions involving all levels
as well as the bony pelvis. No evidence of significant epidural
disease extension.

Conus medullaris and cauda equina: Conus extends to the L1 level.
Conus and cauda equina appear normal.

Paraspinal and other soft tissues: Unremarkable.

Disc levels: Imaged in the sagittal plane only, there is a small
central disc protrusion and annular fissure at T11-T12.

L1-L2: Mild facet hypertrophy and prominence of dorsal epidural fat.
No significant canal or foraminal stenosis.

L2-L3: Mild facet hypertrophy and prominence of dorsal epidural fat.
No significant canal or foraminal stenosis.

L3-L4: Left subarticular/foraminal disc protrusion. Mild facet
arthropathy with ligamentum flavum infolding and mild prominence of
dorsal epidural fat. No significant canal stenosis. Effacement of
the left lateral recess with compression of the traversing L4 nerve
root. No right foraminal stenosis. Mild left foraminal stenosis.

L4-L5: Mild disc bulge. Mild facet arthropathy with ligamentum
flavum infolding and mild prominence of dorsal epidural fat. No
significant canal or foraminal stenosis.

L5-S1: Mild facet arthropathy and prominence of epidural fat. No
significant canal or foraminal stenosis.
IMPRESSION: Multilevel, multifocal osseous lesions suspicious for metastatic
disease. No compression deformity or epidural disease.

Disc protrusion at L3-L4 compressing the left L4 nerve root.

## 2019-05-10 IMAGING — CT CT ABD-PELV W/ CM
2 of 4 series · 11 of 36 positions shown, 13 images · IV contrast (omnipaque)
Comparison: Numerous comparison radiographs of the chest, most
recently [DATE]

CLINICAL DATA: Cancer of unknown primary, worsening headaches, neck
and back pain with numerous intracranial lesions worrisome for
metastatic disease

EXAM:
CT CHEST, ABDOMEN, AND PELVIS WITH CONTRAST
TECHNIQUE: Multidetector CT imaging of the chest, abdomen and pelvis was
performed following the standard protocol during bolus
administration of intravenous contrast.
CONTRAST:  100mL OMNIPAQUE IOHEXOL 300 MG/ML  SOLN

[Series 2: cap with · axial · 0.87mm/px · z∈[-667,-127]mm · 8 of 128 slices shown, 10 images]
[im 10/128  mediastinal]
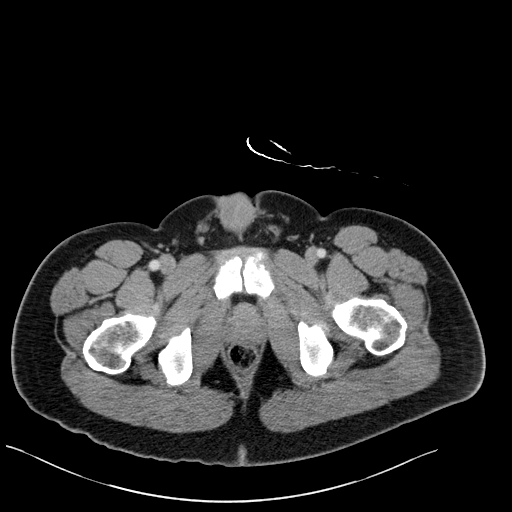
[im 10/128  lung]
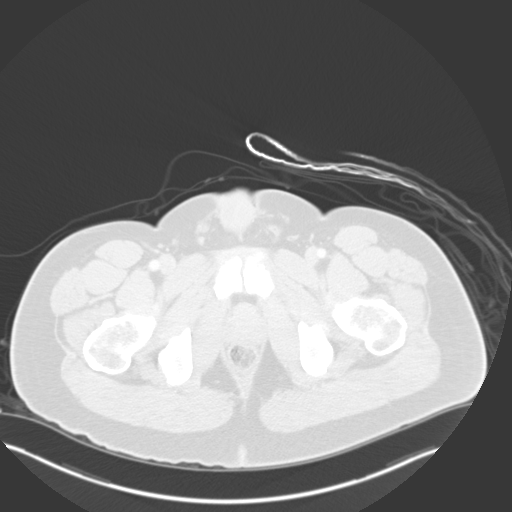
[im 30/128  lung]
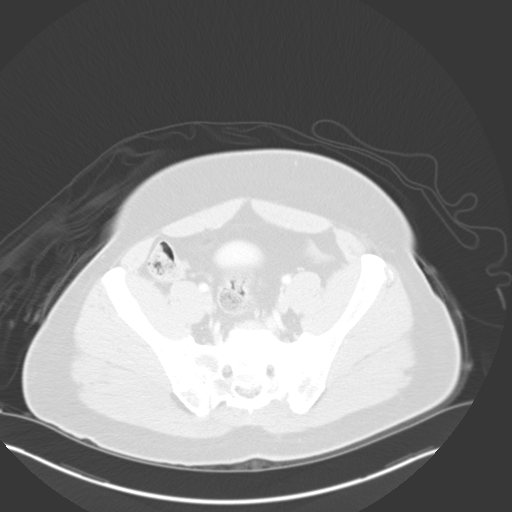
[im 40/128  lung]
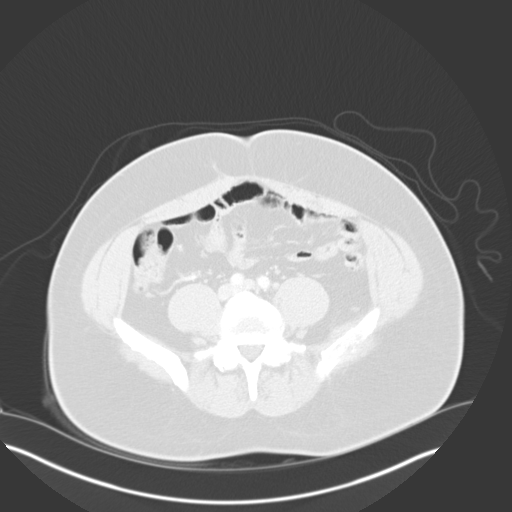
[im 59/128  lung]
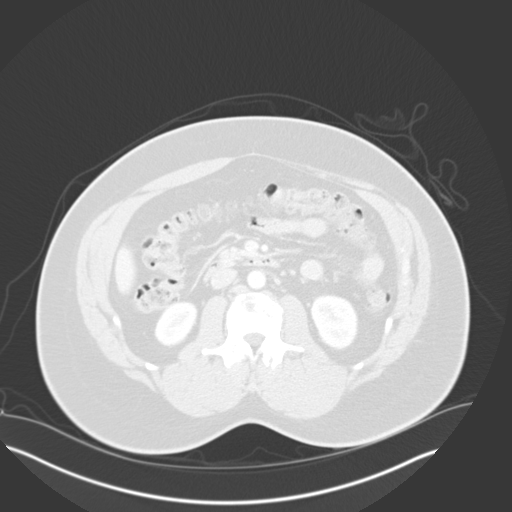
[im 69/128  mediastinal]
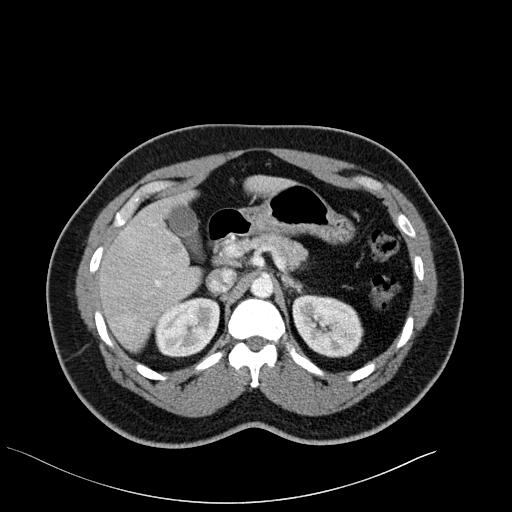
[im 69/128  lung]
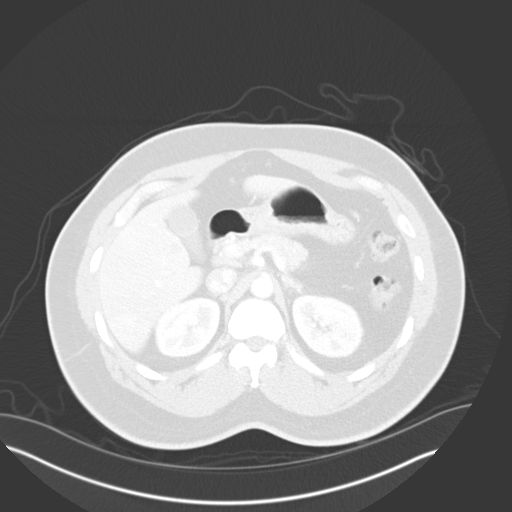
[im 88/128  lung]
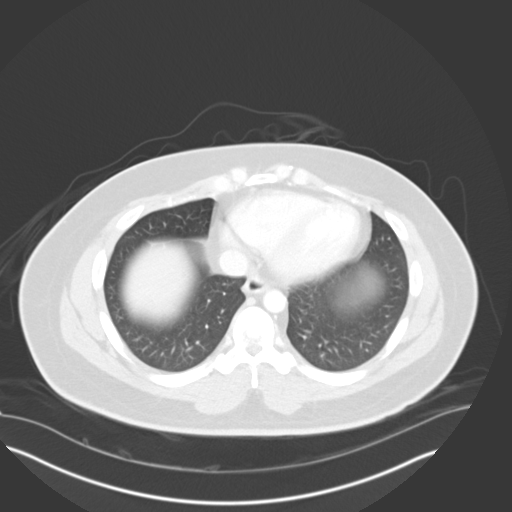
[im 98/128  lung]
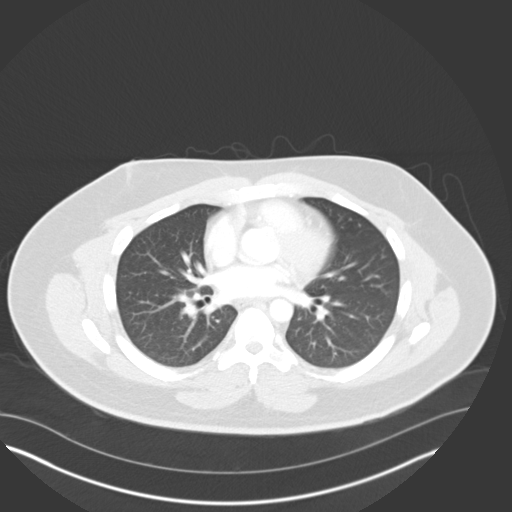
[im 118/128  lung]
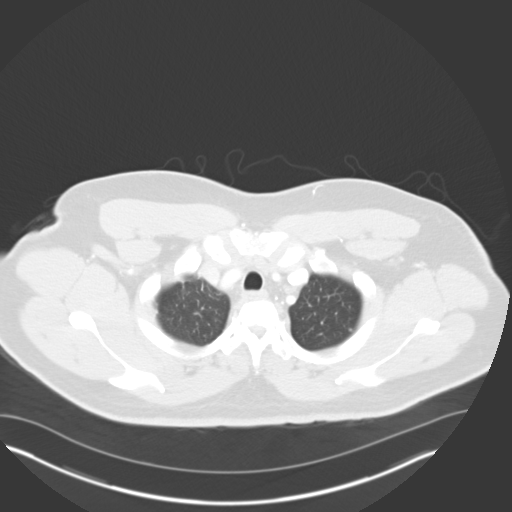

[Series 3: coronals · coronal · 0.79mm/px · 3 of 150 slices shown]
[im 30/150  lung]
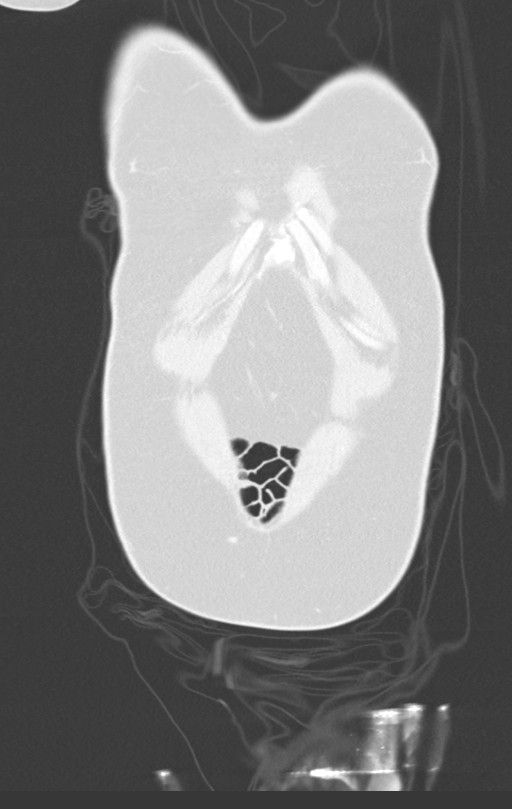
[im 60/150  lung]
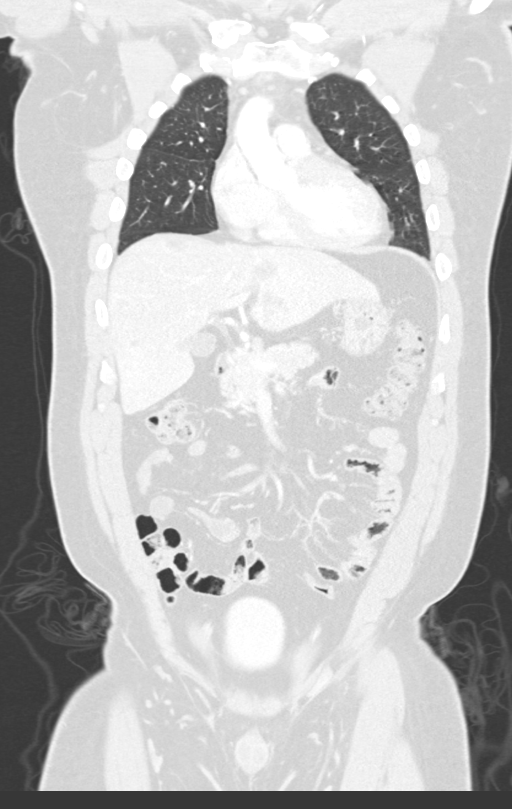
[im 90/150  lung]
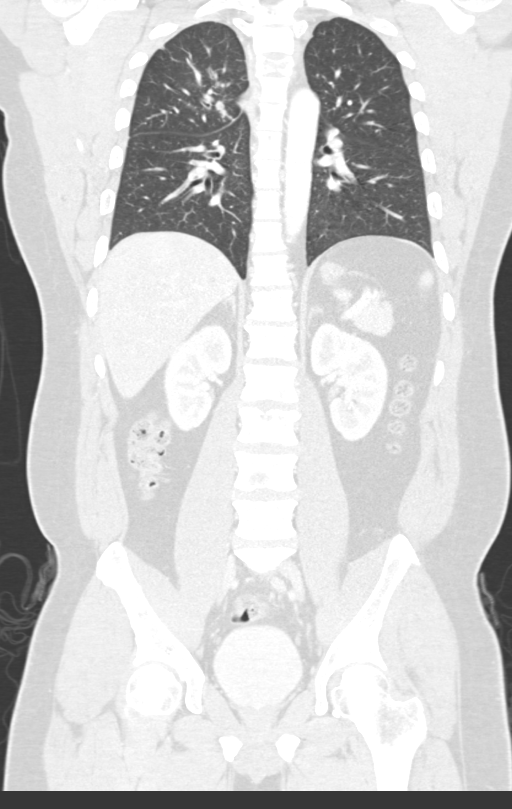

[11 of 36 positions shown; findings below may reference images not displayed]

FINDINGS: CT CHEST FINDINGS

Cardiovascular: The aortic root is suboptimally assessed given
cardiac pulsation artifact. The aorta is normal caliber without
acute luminal or periaortic abnormality. Normal 3 vessel branching
of the aortic arch. Proximal great vessels opacified normally.
Central pulmonary arteries are normal caliber. No large central
filling defects are identified on this non tailored examination of
the pulmonary arteries. Normal heart size. No pericardial effusion.

Mediastinum/Nodes: Multiple right paratracheal and hilar lymph nodes
are present with some low central attenuation which may suggest
necrosis, representative nodes include a 12 mm low right
paratracheal node (4R, 3/74), 10 mm low right precarinal node (4R,
[DATE]), and a a 19 mm right hilar lymph node (10R, 2/44).

Lungs/Pleura: In the right lung apex is a 3.0 x 3.0 x 2.8 cm lesion
with spiculated margins extending to both the pleural surface and
superior right hilum. Additional subsided sub 5 mm nodules are seen
in the left lung apex (6/40, 6/56), superior segment left lower lobe
(6/59). No consolidation. No convincing features of edema. No
pneumothorax or effusion.

Musculoskeletal: There are numerous lytic appearing lesions
throughout the thoracic spine involving both the vertebral bodies
and posterior elements of multiple levels. Notably, a
hypoattenuating lesion in the L3 vertebral body demonstrates a
superimposed pathologic compression deformity with approximately 30%
height loss. No worrisome chest wall lesion.

CT ABDOMEN PELVIS FINDINGS

Hepatobiliary: There are innumerable peripherally enhancing
centrally hypoattenuating lesions seen throughout the liver. The
largest lesion is seen within the caudate lobe measuring up to
cm in size (2/47). An additional 2.4 cm lesion in the left lobe
liver (2/52). Additional geographic region of hyperattenuation in
the periphery of the right upper lobe, may reflect a transient
hepatic attenuation difference though certainly an underlying lesion
in this location is not excluded. Gallbladder is unremarkable. No
calcified gallstones. No biliary ductal dilatation.

Pancreas: Unremarkable. No pancreatic ductal dilatation or
surrounding inflammatory changes.

Spleen: Normal in size without focal abnormality.

Adrenals/Urinary Tract: Adrenal glands are unremarkable. Kidneys are
normal, without renal calculi, focal lesion, or hydronephrosis.
Bladder is unremarkable. High attenuation material within the
urinary bladder likely related to recent contrast enhanced brain
MRI.

Stomach/Bowel: Distal esophagus, stomach and duodenal sweep are
unremarkable. No small bowel wall thickening or dilatation. A normal
appendix is visualized. No colonic dilatation or wall thickening. No
evidence of obstruction.

Vascular/Lymphatic: No significant vascular findings are present. No
enlarged abdominal or pelvic lymph nodes.

Reproductive: The prostate and seminal vesicles are unremarkable.

Other: No abdominopelvic free fluid or air. Small fat containing
inguinal hernias. No bowel containing hernia.

Musculoskeletal: There are numerous lucent lesions within the
vertebral bodies and transverse processes of the lumbar spine
compatible with osseous metastatic disease. Additional lucent
lesions present within both [place] including a lesion in the left
iliac wing which demonstrates some soft tissue extension (2/90).
IMPRESSION: 1. Thick-walled cavitary lesion in the left lung apex with
spiculated margins extending to both the pleural surface and
superior right hilum, concerning for primary lung malignancy.
Appearance could suggest a squamous cell carcinoma the lung given
the location and cavitation though is by no means a diagnostic
finding.
2. Multiple additional contralateral sub 5 mm lymph nodes, could be
infectious or inflammatory though should be certainly viewed with
suspicion and for metastatic disease given other findings throughout
the body.
3. Numerous necrotic appearing is lateral mediastinal and right
hilar lymph nodes, likely metastatic.
4. Innumerable rim enhancing hepatic lesions throughout both lobes
of the liver compatible with solid organ metastasis. Additional
geographic region of hyperattenuation in the periphery of the right
upper lobe, may reflect a transient hepatic attenuation difference
though certainly an underlying lesion in this location is not
excluded. Attention on follow-up recommended.
5. There are multiple lytic appearing lesions throughout the
thoracic and lumbar spine as well as portions of the bony pelvis.
6. Soft tissue extension of an osseous lesion involving the left
iliac wing.
7. Pathologic split type compression fracture of the T3 vertebral
body with approximately 30% height loss.

These results were called by telephone at the time of interpretation
acknowledged these results.

## 2019-05-10 IMAGING — CT CT CHEST W/ CM
2 of 4 series · 11 of 36 positions shown, 13 images · IV contrast (omnipaque)
Comparison: Numerous comparison radiographs of the chest, most
recently [DATE]

CLINICAL DATA: Cancer of unknown primary, worsening headaches, neck
and back pain with numerous intracranial lesions worrisome for
metastatic disease

EXAM:
CT CHEST, ABDOMEN, AND PELVIS WITH CONTRAST
TECHNIQUE: Multidetector CT imaging of the chest, abdomen and pelvis was
performed following the standard protocol during bolus
administration of intravenous contrast.
CONTRAST:  100mL OMNIPAQUE IOHEXOL 300 MG/ML  SOLN

[Series 2: cap with · axial · 0.87mm/px · z∈[-667,-127]mm · 8 of 128 slices shown, 10 images]
[im 10/128  mediastinal]
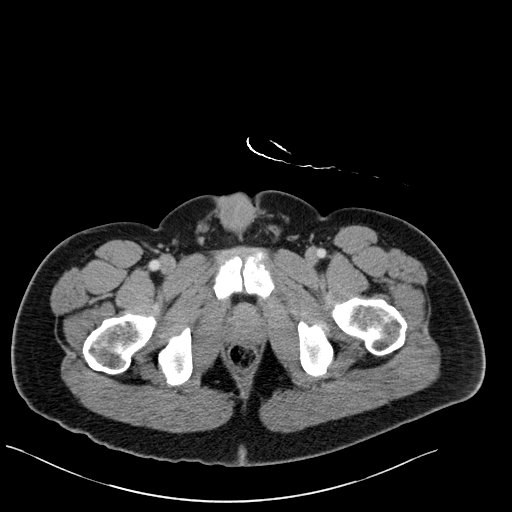
[im 10/128  lung]
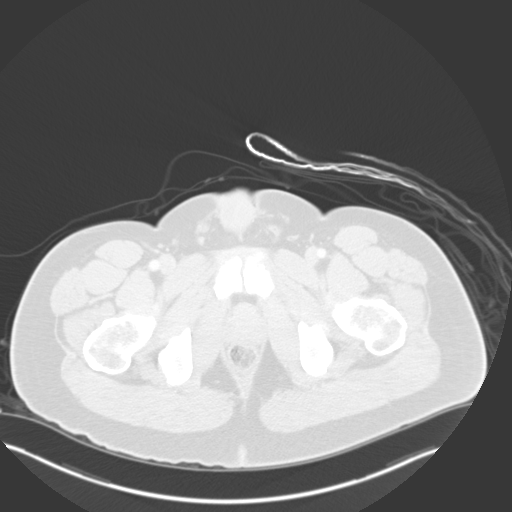
[im 30/128  lung]
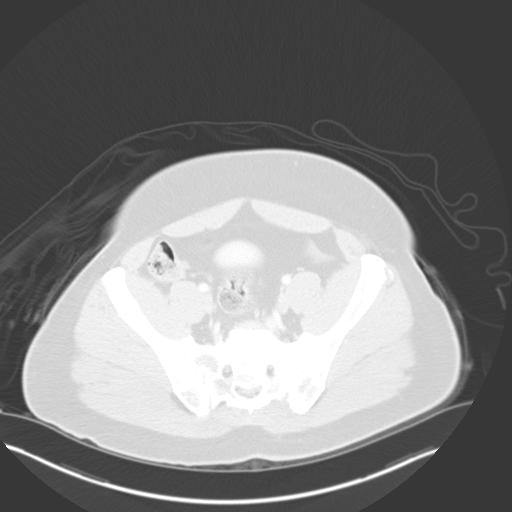
[im 40/128  lung]
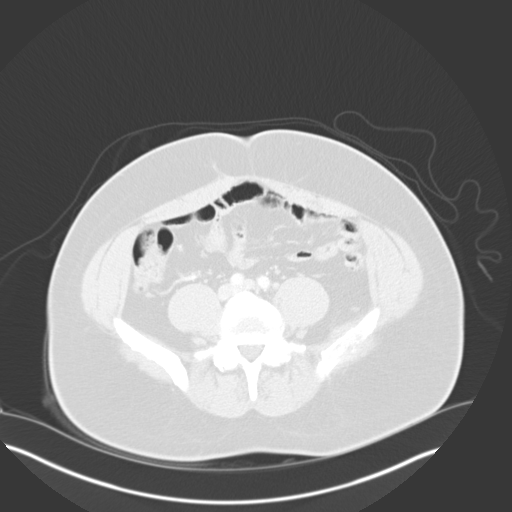
[im 59/128  lung]
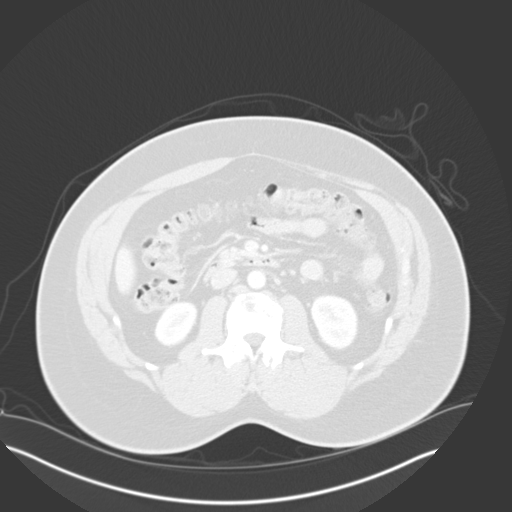
[im 69/128  mediastinal]
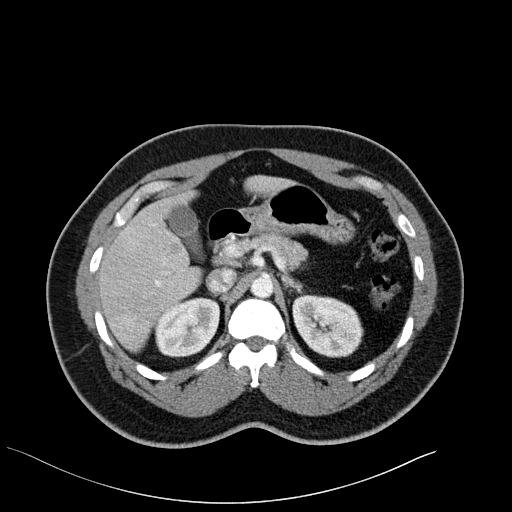
[im 69/128  lung]
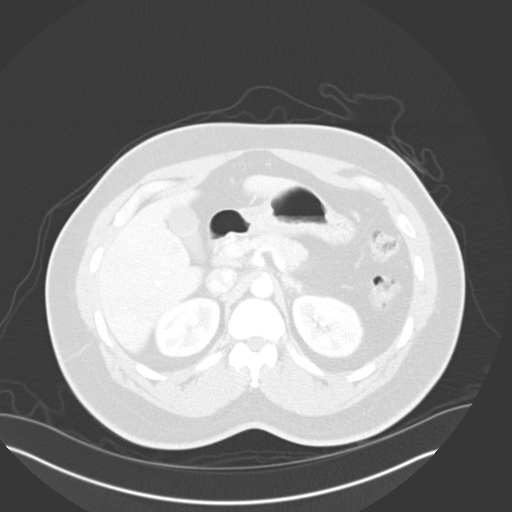
[im 88/128  lung]
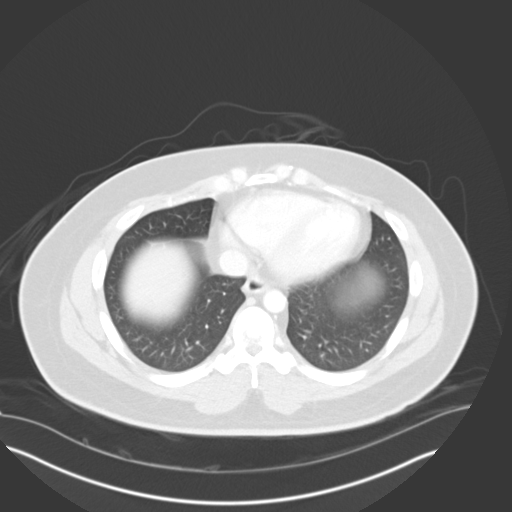
[im 98/128  lung]
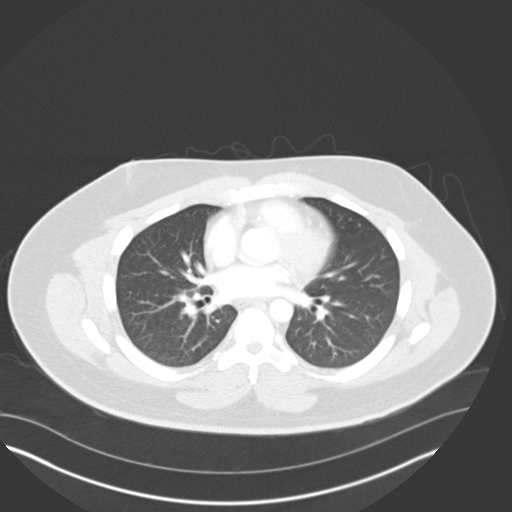
[im 118/128  lung]
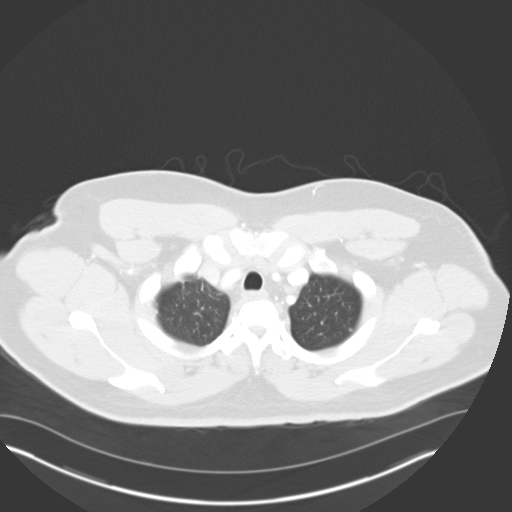

[Series 3: coronals · coronal · 0.79mm/px · 3 of 150 slices shown]
[im 30/150  lung]
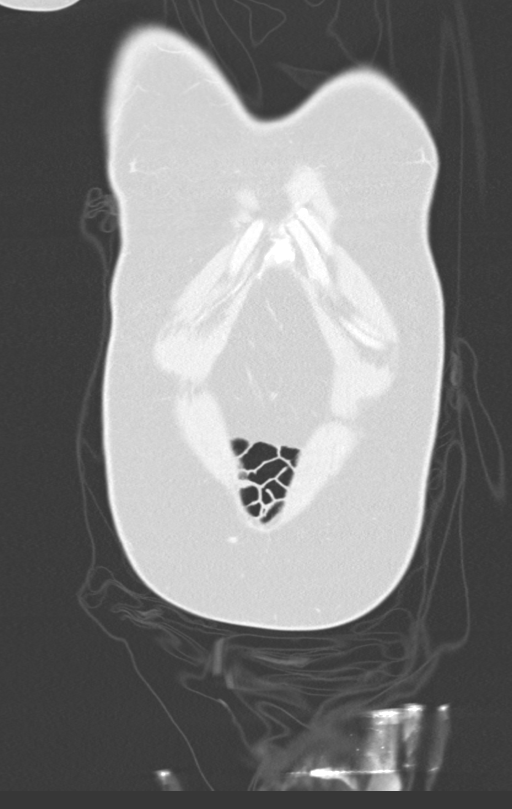
[im 60/150  lung]
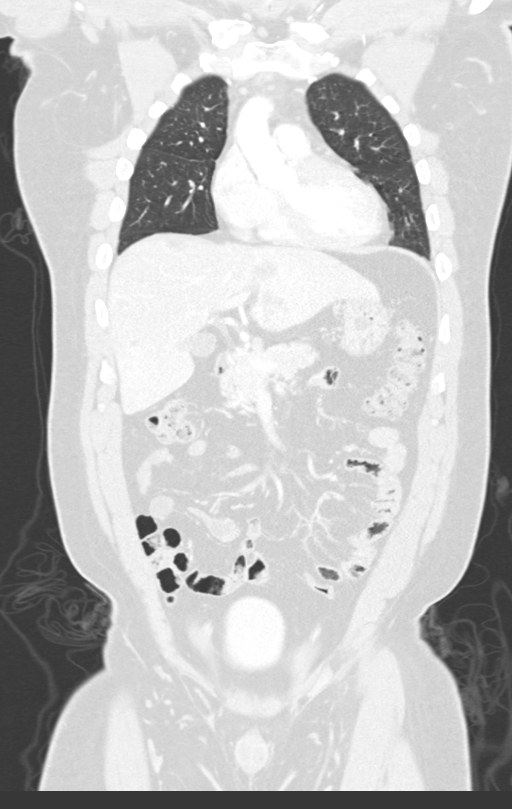
[im 90/150  lung]
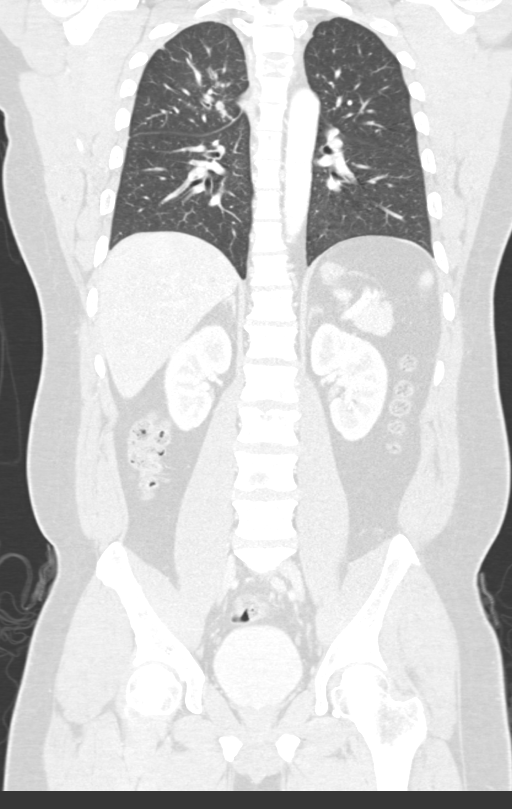

[11 of 36 positions shown; findings below may reference images not displayed]

FINDINGS: CT CHEST FINDINGS

Cardiovascular: The aortic root is suboptimally assessed given
cardiac pulsation artifact. The aorta is normal caliber without
acute luminal or periaortic abnormality. Normal 3 vessel branching
of the aortic arch. Proximal great vessels opacified normally.
Central pulmonary arteries are normal caliber. No large central
filling defects are identified on this non tailored examination of
the pulmonary arteries. Normal heart size. No pericardial effusion.

Mediastinum/Nodes: Multiple right paratracheal and hilar lymph nodes
are present with some low central attenuation which may suggest
necrosis, representative nodes include a 12 mm low right
paratracheal node (4R, 3/74), 10 mm low right precarinal node (4R,
[DATE]), and a a 19 mm right hilar lymph node (10R, 2/44).

Lungs/Pleura: In the right lung apex is a 3.0 x 3.0 x 2.8 cm lesion
with spiculated margins extending to both the pleural surface and
superior right hilum. Additional subsided sub 5 mm nodules are seen
in the left lung apex (6/40, 6/56), superior segment left lower lobe
(6/59). No consolidation. No convincing features of edema. No
pneumothorax or effusion.

Musculoskeletal: There are numerous lytic appearing lesions
throughout the thoracic spine involving both the vertebral bodies
and posterior elements of multiple levels. Notably, a
hypoattenuating lesion in the L3 vertebral body demonstrates a
superimposed pathologic compression deformity with approximately 30%
height loss. No worrisome chest wall lesion.

CT ABDOMEN PELVIS FINDINGS

Hepatobiliary: There are innumerable peripherally enhancing
centrally hypoattenuating lesions seen throughout the liver. The
largest lesion is seen within the caudate lobe measuring up to
cm in size (2/47). An additional 2.4 cm lesion in the left lobe
liver (2/52). Additional geographic region of hyperattenuation in
the periphery of the right upper lobe, may reflect a transient
hepatic attenuation difference though certainly an underlying lesion
in this location is not excluded. Gallbladder is unremarkable. No
calcified gallstones. No biliary ductal dilatation.

Pancreas: Unremarkable. No pancreatic ductal dilatation or
surrounding inflammatory changes.

Spleen: Normal in size without focal abnormality.

Adrenals/Urinary Tract: Adrenal glands are unremarkable. Kidneys are
normal, without renal calculi, focal lesion, or hydronephrosis.
Bladder is unremarkable. High attenuation material within the
urinary bladder likely related to recent contrast enhanced brain
MRI.

Stomach/Bowel: Distal esophagus, stomach and duodenal sweep are
unremarkable. No small bowel wall thickening or dilatation. A normal
appendix is visualized. No colonic dilatation or wall thickening. No
evidence of obstruction.

Vascular/Lymphatic: No significant vascular findings are present. No
enlarged abdominal or pelvic lymph nodes.

Reproductive: The prostate and seminal vesicles are unremarkable.

Other: No abdominopelvic free fluid or air. Small fat containing
inguinal hernias. No bowel containing hernia.

Musculoskeletal: There are numerous lucent lesions within the
vertebral bodies and transverse processes of the lumbar spine
compatible with osseous metastatic disease. Additional lucent
lesions present within both [place] including a lesion in the left
iliac wing which demonstrates some soft tissue extension (2/90).
IMPRESSION: 1. Thick-walled cavitary lesion in the left lung apex with
spiculated margins extending to both the pleural surface and
superior right hilum, concerning for primary lung malignancy.
Appearance could suggest a squamous cell carcinoma the lung given
the location and cavitation though is by no means a diagnostic
finding.
2. Multiple additional contralateral sub 5 mm lymph nodes, could be
infectious or inflammatory though should be certainly viewed with
suspicion and for metastatic disease given other findings throughout
the body.
3. Numerous necrotic appearing is lateral mediastinal and right
hilar lymph nodes, likely metastatic.
4. Innumerable rim enhancing hepatic lesions throughout both lobes
of the liver compatible with solid organ metastasis. Additional
geographic region of hyperattenuation in the periphery of the right
upper lobe, may reflect a transient hepatic attenuation difference
though certainly an underlying lesion in this location is not
excluded. Attention on follow-up recommended.
5. There are multiple lytic appearing lesions throughout the
thoracic and lumbar spine as well as portions of the bony pelvis.
6. Soft tissue extension of an osseous lesion involving the left
iliac wing.
7. Pathologic split type compression fracture of the T3 vertebral
body with approximately 30% height loss.

These results were called by telephone at the time of interpretation
acknowledged these results.

## 2019-05-10 IMAGING — MR MR ORBITS W/ CM
13 of 14 series · 41 of 48 positions shown · IV contrast (gadavist)
Comparison: None.

CLINICAL DATA: Left retinal lesion, headaches

EXAM:
MRI HEAD AND ORBITS WITHOUT AND WITH CONTRAST
TECHNIQUE: Multiplanar, multiecho pulse sequences of the brain and surrounding
structures were obtained without and with intravenous contrast.
Multiplanar, multiecho pulse sequences of the orbits and surrounding
structures were obtained including fat saturation techniques, before
and after intravenous contrast administration.
CONTRAST:  10mL GADAVIST GADOBUTROL 1 MMOL/ML IV SOLN

[Series 5: T2 · sagittal · 5.0mm · 0.47mm/px · 3 of 24 slices shown (1 of 3)]
[im 1/24]
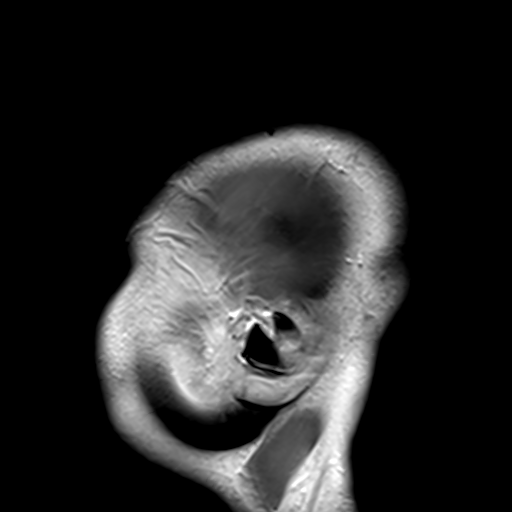
[im 12/24]
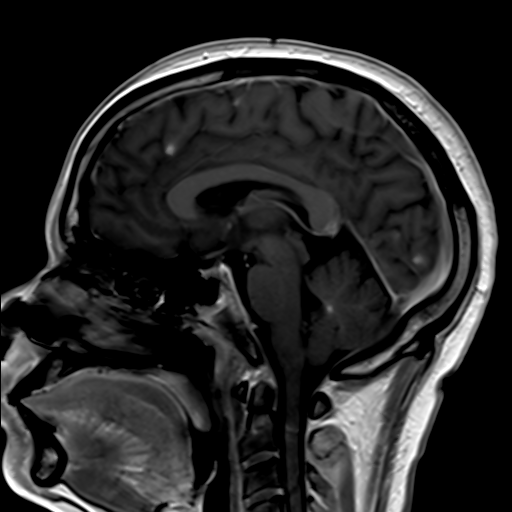
[im 24/24]
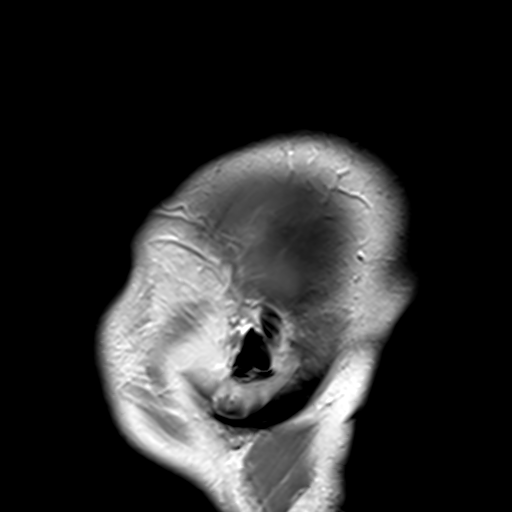

[Series 6: T2 · axial · 5.0mm · 0.45mm/px · z∈[-54,+95]mm · 2 of 24 slices shown (2 of 3)]
[im 1/24]
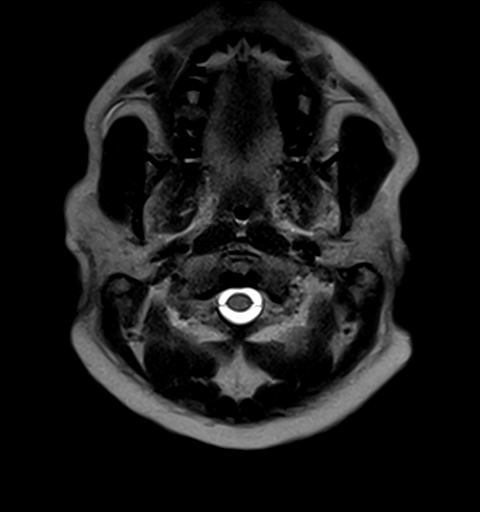
[im 24/24]
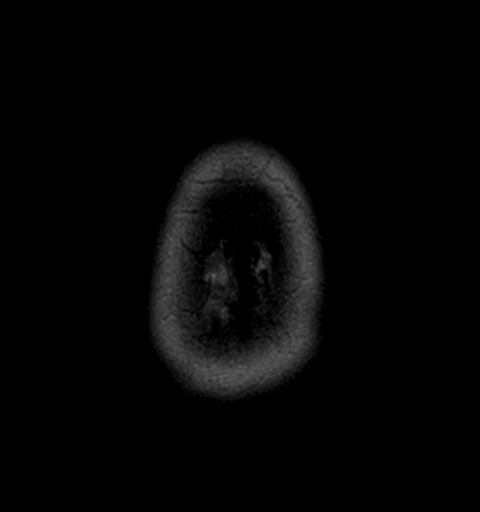

[Series 7: dwi_tracew · axial · 3.0mm · 1.08mm/px · z∈[-57,+57]mm · 6 of 101 slices shown]
[im 1/101]
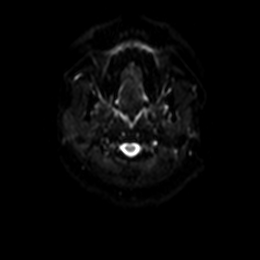
[im 13/101]
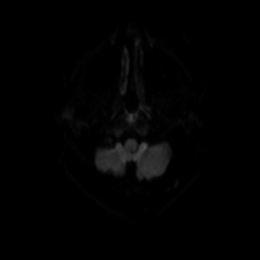
[im 26/101]
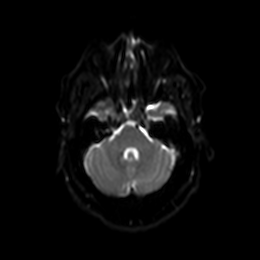
[im 38/101]
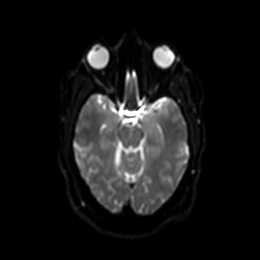
[im 63/101]
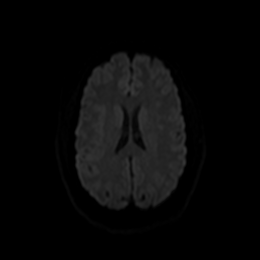
[im 76/101]
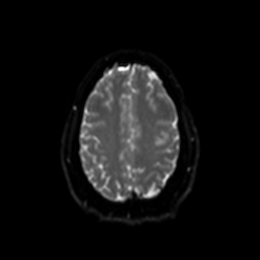

[Series 9: GRE · axial · 3.0mm · 0.45mm/px · z∈[-53,+97]mm · 4 of 51 slices shown]
[im 1/51]
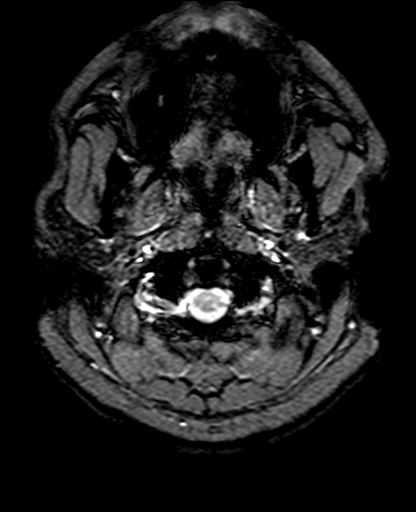
[im 17/51]
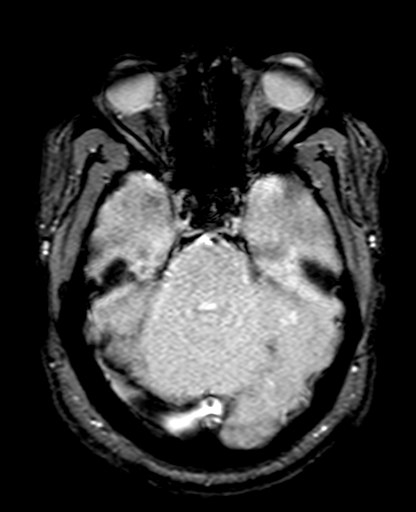
[im 34/51]
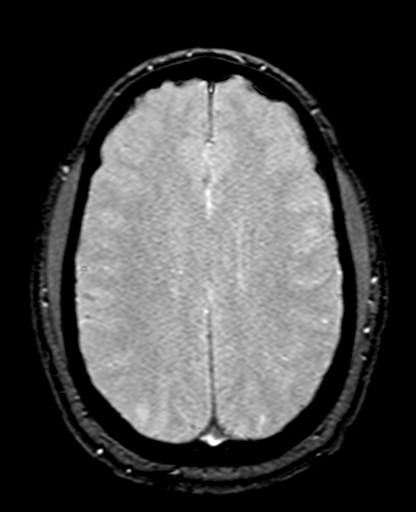
[im 51/51]
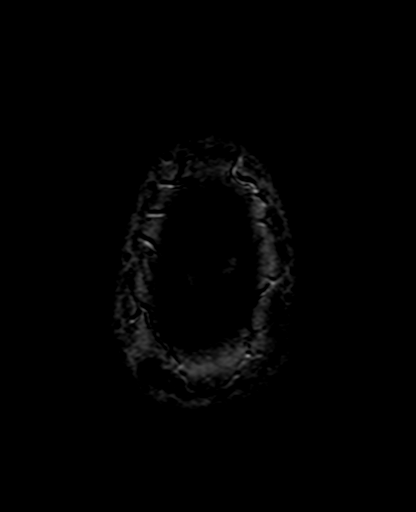

[Series 10: FLAIR · axial · 3.0mm · 0.86mm/px · z∈[-53,+97]mm · 4 of 51 slices shown]
[im 1/51]
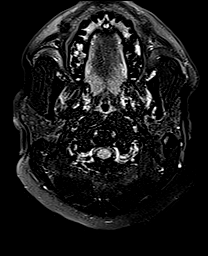
[im 17/51]
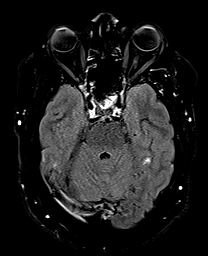
[im 34/51]
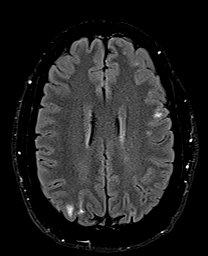
[im 51/51]
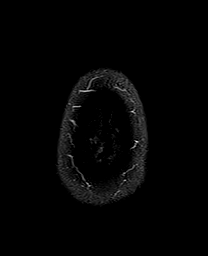

[Series 11: T2 fat-sat · axial · 3.0mm · 0.47mm/px · 1 of 17 slices shown (1 of 2)]
[im 1/17]
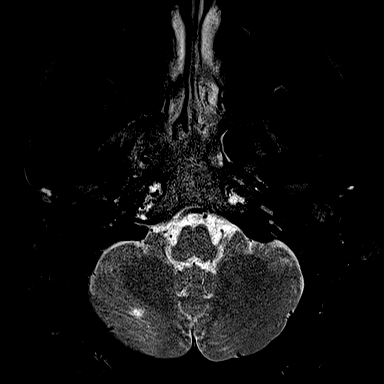

[Series 12: T1 · axial · 3.0mm · 0.70mm/px · 1 of 17 slices shown (1 of 3)]
[im 1/17]
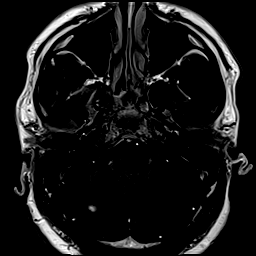

[Series 13: T1 · axial · 3.0mm · 0.45mm/px · z∈[-53,+97]mm · 4 of 51 slices shown (2 of 3)]
[im 1/51]
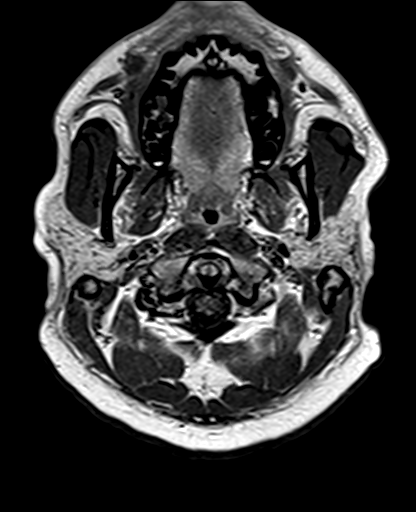
[im 17/51]
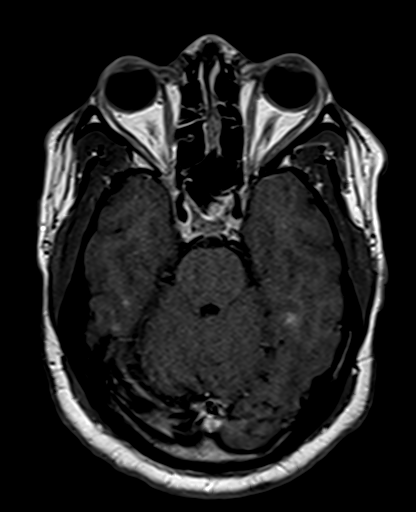
[im 34/51]
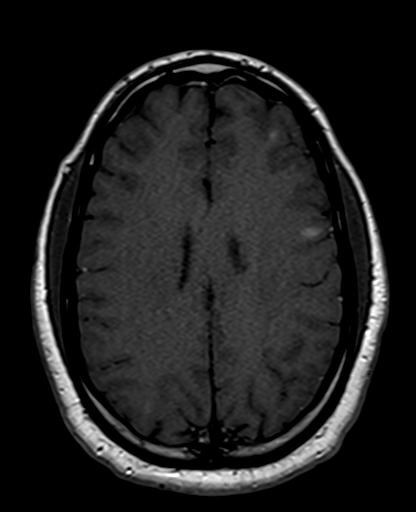
[im 51/51]
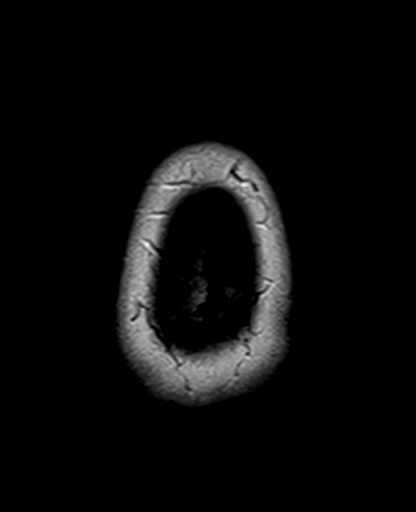

[Series 14: T1 · coronal · 3.0mm · 0.70mm/px · 3 of 32 slices shown (3 of 3)]
[im 1/32]
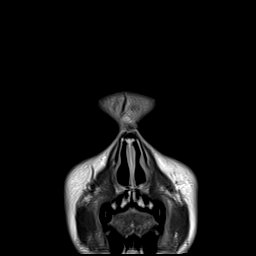
[im 16/32]
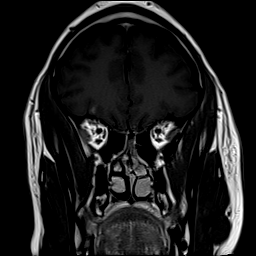
[im 32/32]
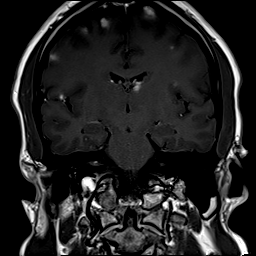

[Series 15: T2 fat-sat · coronal · 3.0mm · 0.70mm/px · 3 of 32 slices shown (2 of 2)]
[im 1/32]
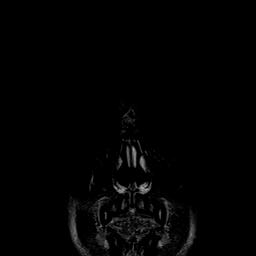
[im 16/32]
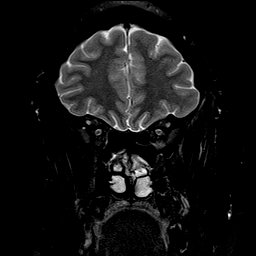
[im 32/32]
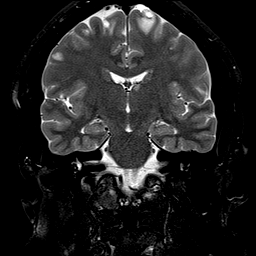

[Series 16: DWI · coronal · 5.0mm · 1.31mm/px · 5 of 60 slices shown (1 of 2)]
[im 1/60]
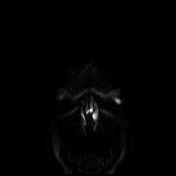
[im 15/60]
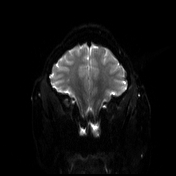
[im 30/60]
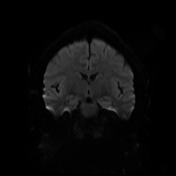
[im 45/60]
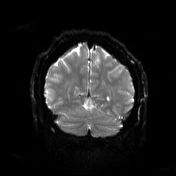
[im 60/60]
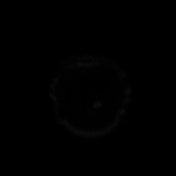

[Series 17: DWI · coronal · 5.0mm · 1.31mm/px · 3 of 30 slices shown (2 of 2)]
[im 1/30]
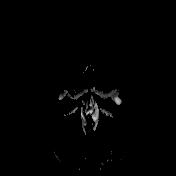
[im 15/30]
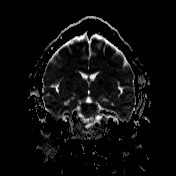
[im 30/30]
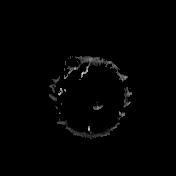

[Series 18: T2 · coronal · 5.0mm · 0.86mm/px · 2 of 28 slices shown (3 of 3)]
[im 1/28]
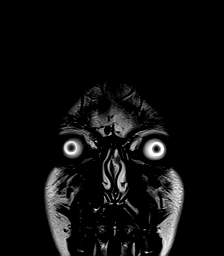
[im 28/28]
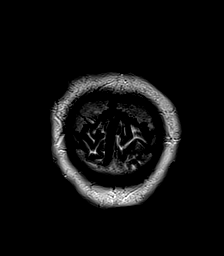

[41 of 48 positions shown; findings below may reference images not displayed]

FINDINGS: Intravenous contrast was injected for MRI of the lumbar spine
shortly before this study. Therefore, contrast remains present and
all images are effectively postcontrast

MRI HEAD FINDINGS

Brain: There are multiple (greater than 30) subcentimeter
parenchymal enhancing lesions including involvement of the
cerebellum and right midbrain tectum. There is mild edema associated
with some of the lesions. A minority also demonstrate corresponding
susceptibility, which may reflect intralesional hemorrhage or
mineralization.

There is no acute infarction. No hydrocephalus. No significant mass
effect.

Vascular: Major vessel flow voids at the skull base are preserved.

Skull and upper cervical spine: Probable areas of abnormal marrow
enhancement at the skull base and C2 posterior elements

Other: Mastoid air cells are clear.

MRI ORBITS FINDINGS

Orbits: No proptosis. Suspected thin enhancing tissue at the
posterior aspect of the left globe. Extraocular muscles are
symmetric and unremarkable. There is no abnormal enhancement of the
optic nerve sheath complexes.

Visualized sinuses: Minor mucosal thickening.

Soft tissues: Unremarkable.
IMPRESSION: Numerous enhancing parenchymal intracranial lesions likely
reflecting metastases. Mild associated edema. No mass effect.

Probable areas of abnormal marrow enhancement reflecting osseous
metastases at the skull base and upper cervical spine.

Suspected abnormal thin enhancing tissue at the posterior aspect of
the left lobe.

## 2019-05-10 MED ORDER — HYDROCODONE-ACETAMINOPHEN 5-325 MG PO TABS
1.0000 | ORAL_TABLET | Freq: Once | ORAL | Status: AC
  Administered 2019-05-10: 1 via ORAL
  Filled 2019-05-10: qty 1

## 2019-05-10 MED ORDER — LORAZEPAM 2 MG/ML IJ SOLN
1.0000 mg | Freq: Once | INTRAMUSCULAR | Status: AC
  Administered 2019-05-10: 1 mg via INTRAVENOUS
  Filled 2019-05-10: qty 1

## 2019-05-10 MED ORDER — LORAZEPAM 2 MG/ML IJ SOLN
2.0000 mg | Freq: Once | INTRAMUSCULAR | Status: AC
  Administered 2019-05-10: 2 mg via INTRAVENOUS
  Filled 2019-05-10: qty 1

## 2019-05-10 MED ORDER — GADOBUTROL 1 MMOL/ML IV SOLN
10.0000 mL | Freq: Once | INTRAVENOUS | Status: AC | PRN
  Administered 2019-05-10: 10 mL via INTRAVENOUS

## 2019-05-10 MED ORDER — HYDROCODONE-ACETAMINOPHEN 5-325 MG PO TABS: 1.0000 | ORAL_TABLET | ORAL | 0 refills | Status: DC | PRN

## 2019-05-10 MED ORDER — FENTANYL CITRATE (PF) 100 MCG/2ML IJ SOLN
50.0000 ug | Freq: Once | INTRAMUSCULAR | Status: AC
  Administered 2019-05-10: 50 ug via INTRAVENOUS
  Filled 2019-05-10: qty 2

## 2019-05-10 MED ORDER — IOHEXOL 300 MG/ML  SOLN
100.0000 mL | Freq: Once | INTRAMUSCULAR | Status: AC | PRN
  Administered 2019-05-10: 100 mL via INTRAVENOUS

## 2019-05-10 MED ORDER — DEXAMETHASONE SODIUM PHOSPHATE 10 MG/ML IJ SOLN
10.0000 mg | Freq: Once | INTRAMUSCULAR | Status: AC
  Administered 2019-05-10: 10 mg via INTRAVENOUS
  Filled 2019-05-10: qty 1

## 2019-05-10 MED ORDER — SODIUM CHLORIDE (PF) 0.9 % IJ SOLN
INTRAMUSCULAR | Status: AC
  Filled 2019-05-10: qty 50

## 2019-05-10 NOTE — ED Provider Notes (Signed)
Received patient as a handoff at shift change from [facility], PA-C.    HPI as obtained by handoff provider: [name] is a 34 y.o. male with pertinent past medical history of hypertension presents to the emergency department today for worsening headache and left pelvis pain.  He states that he has had 1 year of new headaches that he describes at the back of his head that moves towards the front of his head bilaterally.  For the past month they have been worsening and he has been taking about 8 ibuprofen and 1000 mg of Tylenol daily for them.  He states that this does help him get through the night but wakes up with a headache. They are constant and he has not had a headache free day this month.  He also reports that his head is tender to touch and feels like pins-and-needles every time he touches his head.  He also admits to pain when he chews.  He states that the pain in his head radiates down to his left side of his neck. No history of migraines.  He also states that for the past week and a half he has had some left eye central vision loss which has never occurred before.  He went to his neurologist in [place] who referred him to [facility] ophthalmology.  He was seen there yesterday and had a multitude of lab tests and an orbital ultrasound which showed a retinal lesion with surrounding subretinal fluid.  He is unable to answer any questions about that visit and is unsure about his follow-up. Chart review is not available for that visit. He reports that he has not been taking his blood pressure medication for the past 2 weeks.  He denies any chest pain, shortness of breath, dizziness, speech difficulty, syncope, eye pain, diplopia, color vision loss, gait abnormalities. Wife is present in the room and does think that pt has been at baseline, cognitively.   In regards to his left hip pain, he states that this has been occurring for the past 2 weeks.  He denies any trauma or falls.  He admits to the  pain starting in his hip and radiating down into his leg,which makes it difficult to walk.  He also admits to some weakness in his calf muscle that he has noticed over this time.  He denies any back pain, fevers, chills. He does admit to two weeks of night sweats. He denies any saddle paresthesias, incontinence, dysuria.  Physical Exam  BP (!) 161/106   Pulse (!) 108   Temp 98.8 F (37.1 C) (Oral)   Resp (!) 39   SpO2 100%   Physical Exam Vitals and nursing note reviewed. Exam conducted with a chaperone present.  HENT:     Head: Normocephalic and atraumatic.  Eyes:     General: No scleral icterus.    Extraocular Movements: Extraocular movements intact.     Conjunctiva/sclera: Conjunctivae normal.     Pupils: Pupils are equal, round, and reactive to light.  Cardiovascular:     Rate and Rhythm: Normal rate and regular rhythm.     Pulses: Normal pulses.     Heart sounds: Normal heart sounds.  Pulmonary:     Effort: Pulmonary effort is normal. No respiratory distress.     Breath sounds: Normal breath sounds. No wheezing or rales.  Abdominal:     General: Abdomen is flat. There is no distension.     Palpations: Abdomen is soft.  Tenderness: There is no abdominal tenderness.  Musculoskeletal:     Cervical back: Normal range of motion and neck supple. No rigidity.     Comments: Left leg: 2 out of 5 strength when compared to right leg.  Skin:    General: Skin is dry.     Capillary Refill: Capillary refill takes less than 2 seconds.  Neurological:     Mental Status: He is alert and oriented to person, place, and time.     GCS: GCS eye subscore is 4. GCS verbal subscore is 5. GCS motor subscore is 6.  Psychiatric:        Mood and Affect: Mood normal.        Behavior: Behavior normal.        Thought Content: Thought content normal.     ED Course/Procedures     Procedures Results for orders placed or performed during the hospital encounter of 05/10/19  Comprehensive  metabolic panel  Result Value Ref Range   Sodium 139 135 - 145 mmol/L   Potassium 4.5 3.5 - 5.1 mmol/L   Chloride 100 98 - 111 mmol/L   CO2 27 22 - 32 mmol/L   Glucose, Bld 99 70 - 99 mg/dL   BUN 15 6 - 20 mg/dL   Creatinine, Ser 1.37 (H) 0.61 - 1.24 mg/dL   Calcium 9.8 8.9 - 10.3 mg/dL   Total Protein 8.5 (H) 6.5 - 8.1 g/dL   Albumin 4.3 3.5 - 5.0 g/dL   AST 51 (H) 15 - 41 U/L   ALT 66 (H) 0 - 44 U/L   Alkaline Phosphatase 261 (H) 38 - 126 U/L   Total Bilirubin 1.0 0.3 - 1.2 mg/dL   GFR calc non Af Amer >60 >60 mL/min   GFR calc Af Amer >60 >60 mL/min   Anion gap 12 5 - 15  CBC with Differential  Result Value Ref Range   WBC 11.9 (H) 4.0 - 10.5 K/uL   RBC 4.91 4.22 - 5.81 MIL/uL   Hemoglobin 13.1 13.0 - 17.0 g/dL   HCT 44.5 39.0 - 52.0 %   MCV 90.6 80.0 - 100.0 fL   MCH 26.7 26.0 - 34.0 pg   MCHC 29.4 (L) 30.0 - 36.0 g/dL   RDW 14.7 11.5 - 15.5 %   Platelets 325 150 - 400 K/uL   nRBC 0.0 0.0 - 0.2 %   Neutrophils Relative % 81 %   Neutro Abs 9.7 (H) 1.7 - 7.7 K/uL   Lymphocytes Relative 11 %   Lymphs Abs 1.3 0.7 - 4.0 K/uL   Monocytes Relative 6 %   Monocytes Absolute 0.7 0.1 - 1.0 K/uL   Eosinophils Relative 1 %   Eosinophils Absolute 0.1 0.0 - 0.5 K/uL   Basophils Relative 1 %   Basophils Absolute 0.1 0.0 - 0.1 K/uL   Immature Granulocytes 0 %   Abs Immature Granulocytes 0.03 0.00 - 0.07 K/uL   DG Chest 2 View  Result Date: 05/06/2019 CLINICAL DATA:  Chest pain EXAM: CHEST - 2 VIEW COMPARISON:  05/01/2018 FINDINGS: Small opacity of the right upper lobe. Lungs are otherwise clear. No pleural effusion or pneumothorax. Cardiomediastinal contours are within normal limits. There is no acute osseous abnormality. IMPRESSION: Small right upper lobe opacity suspicious for pneumonia. Recommend repeat radiograph post treatment. Electronically Signed   By: Macy Mis M.D.   On: 05/06/2019 11:56   DG Lumbar Spine Complete  Result Date: 05/06/2019 CLINICAL DATA:  Low back  pain radiating  into left extremity EXAM: LUMBAR SPINE - COMPLETE 4+ VIEW COMPARISON:  2012 FINDINGS: Stable vertebral body heights. Stable to slightly increased retrolisthesis at L2-L3, L3-L4, and L4-L5. Multilevel facet hypertrophy has progressed. Mild disc space narrowing is stable to mildly progressed. Congenital narrowing of the spinal canal. No evidence of spondylolysis. 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 Electronically Signed   By: Macy Mis M.D.   On: 05/06/2019 12:43   DG Pelvis 1-2 Views  Result Date: 05/10/2019 CLINICAL DATA:  Acute left hip pain without known injury. EXAM: PELVIS - 1-2 VIEW COMPARISON:  None. FINDINGS: There is no evidence of pelvic fracture or diastasis. No pelvic bone lesions are seen. IMPRESSION: Negative. Electronically Signed   By: Marijo Conception M.D.   On: 05/10/2019 14:43   CT Chest W Contrast  Result Date: 05/10/2019 CLINICAL DATA:  Cancer of unknown primary, worsening headaches, neck and back pain with numerous intracranial lesions worrisome for metastatic disease EXAM: CT CHEST, ABDOMEN, AND PELVIS WITH CONTRAST TECHNIQUE: Multidetector CT imaging of the chest, abdomen and pelvis was performed following the standard protocol during bolus administration of intravenous contrast. CONTRAST:  140mL OMNIPAQUE IOHEXOL 300 MG/ML  SOLN COMPARISON:  Numerous comparison radiographs of the chest, most recently 05/06/2019 FINDINGS: CT CHEST FINDINGS Cardiovascular: The aortic root is suboptimally assessed given cardiac pulsation artifact. The aorta is normal caliber without acute luminal or periaortic abnormality. Normal 3 vessel branching of the aortic arch. Proximal great vessels opacified normally. Central pulmonary arteries are normal caliber. No large central filling defects are identified on this non tailored examination of the pulmonary arteries. Normal heart size. No pericardial effusion.  Mediastinum/Nodes: Multiple right paratracheal and hilar lymph nodes are present with some low central attenuation which may suggest necrosis, representative nodes include a 12 mm low right paratracheal node (4R, 3/74), 10 mm low right precarinal node (4R, 2/21), and a a 19 mm right hilar lymph node (10R, 2/44). Lungs/Pleura: In the right lung apex is a 3.0 x 3.0 x 2.8 cm lesion with spiculated margins extending to both the pleural surface and superior right hilum. Additional subsided sub 5 mm nodules are seen in the left lung apex (6/40, 6/56), superior segment left lower lobe (6/59). No consolidation. No convincing features of edema. No pneumothorax or effusion. Musculoskeletal: There are numerous lytic appearing lesions throughout the thoracic spine involving both the vertebral bodies and posterior elements of multiple levels. Notably, a hypoattenuating lesion in the L3 vertebral body demonstrates a superimposed pathologic compression deformity with approximately 30% height loss. No worrisome chest wall lesion. CT ABDOMEN PELVIS FINDINGS Hepatobiliary: There are innumerable peripherally enhancing centrally hypoattenuating lesions seen throughout the liver. The largest lesion is seen within the caudate lobe measuring up to 2.8 cm in size (2/47). An additional 2.4 cm lesion in the left lobe liver (2/52). Additional geographic region of hyperattenuation in the periphery of the right upper lobe, may reflect a transient hepatic attenuation difference though certainly an underlying lesion in this location is not excluded. Gallbladder is unremarkable. No calcified gallstones. No biliary ductal dilatation. Pancreas: Unremarkable. No pancreatic ductal dilatation or surrounding inflammatory changes. Spleen: Normal in size without focal abnormality. Adrenals/Urinary Tract: Adrenal glands are unremarkable. Kidneys are normal, without renal calculi, focal lesion, or hydronephrosis. Bladder is unremarkable. High attenuation  material within the urinary bladder likely related to recent contrast enhanced brain MRI. Stomach/Bowel: Distal esophagus, stomach and duodenal sweep are unremarkable. No small bowel wall thickening or dilatation.  A normal appendix is visualized. No colonic dilatation or wall thickening. No evidence of obstruction. Vascular/Lymphatic: No significant vascular findings are present. No enlarged abdominal or pelvic lymph nodes. Reproductive: The prostate and seminal vesicles are unremarkable. Other: No abdominopelvic free fluid or air. Small fat containing inguinal hernias. No bowel containing hernia. Musculoskeletal: There are numerous lucent lesions within the vertebral bodies and transverse processes of the lumbar spine compatible with osseous metastatic disease. Additional lucent lesions present within both ilia including a lesion in the left iliac wing which demonstrates some soft tissue extension (2/90). IMPRESSION: 1. Thick-walled cavitary lesion in the left lung apex with spiculated margins extending to both the pleural surface and superior right hilum, concerning for primary lung malignancy. Appearance could suggest a squamous cell carcinoma the lung given the location and cavitation though is by no means a diagnostic finding. 2. Multiple additional contralateral sub 5 mm lymph nodes, could be infectious or inflammatory though should be certainly viewed with suspicion and for metastatic disease given other findings throughout the body. 3. Numerous necrotic appearing is lateral mediastinal and right hilar lymph nodes, likely metastatic. 4. Innumerable rim enhancing hepatic lesions throughout both lobes of the liver compatible with solid organ metastasis. Additional geographic region of hyperattenuation in the periphery of the right upper lobe, may reflect a transient hepatic attenuation difference though certainly an underlying lesion in this location is not excluded. Attention on follow-up recommended. 5. There  are multiple lytic appearing lesions throughout the thoracic and lumbar spine as well as portions of the bony pelvis. 6. Soft tissue extension of an osseous lesion involving the left iliac wing. 7. Pathologic split type compression fracture of the T3 vertebral body with approximately 30% height loss. These results were called by telephone at the time of interpretation on 05/10/2019 at 10:05 pm to provider Kindall Swaby , who verbally acknowledged these results. Electronically Signed   By: Lovena Le M.D.   On: 05/10/2019 22:05   MR BRAIN W CONTRAST  Result Date: 05/10/2019 CLINICAL DATA:  Left retinal lesion, headaches EXAM: MRI HEAD AND ORBITS WITHOUT AND WITH CONTRAST TECHNIQUE: Multiplanar, multiecho pulse sequences of the brain and surrounding structures were obtained without and with intravenous contrast. Multiplanar, multiecho pulse sequences of the orbits and surrounding structures were obtained including fat saturation techniques, before and after intravenous contrast administration. CONTRAST:  20mL GADAVIST GADOBUTROL 1 MMOL/ML IV SOLN COMPARISON:  None. FINDINGS: Intravenous contrast was injected for MRI of the lumbar spine shortly before this study. Therefore, contrast remains present and all images are effectively postcontrast MRI HEAD FINDINGS Brain: There are multiple (greater than 30) subcentimeter parenchymal enhancing lesions including involvement of the cerebellum and right midbrain tectum. There is mild edema associated with some of the lesions. A minority also demonstrate corresponding susceptibility, which may reflect intralesional hemorrhage or mineralization. There is no acute infarction. No hydrocephalus. No significant mass effect. Vascular: Major vessel flow voids at the skull base are preserved. Skull and upper cervical spine: Probable areas of abnormal marrow enhancement at the skull base and C2 posterior elements Other: Mastoid air cells are clear. MRI ORBITS FINDINGS Orbits: No  proptosis. Suspected thin enhancing tissue at the posterior aspect of the left globe. Extraocular muscles are symmetric and unremarkable. There is no abnormal enhancement of the optic nerve sheath complexes. Visualized sinuses: Minor mucosal thickening. Soft tissues: Unremarkable. IMPRESSION: Numerous enhancing parenchymal intracranial lesions likely reflecting metastases. Mild associated edema. No mass effect. Probable areas of abnormal marrow enhancement reflecting osseous metastases at  the skull base and upper cervical spine. Suspected abnormal thin enhancing tissue at the posterior aspect of the left lobe. Electronically Signed   By: Macy Mis M.D.   On: 05/10/2019 19:29   MR Lumbar Spine W Wo Contrast  Addendum Date: 05/10/2019   ADDENDUM REPORT: 05/10/2019 17:37 ADDENDUM: Postcontrast images were not reviewed initially. There is enhancement corresponding to STIR hyperintense lesions. Additionally, there is extraosseous disease encroaching on the right S1 foramen. Electronically Signed   By: Macy Mis M.D.   On: 05/10/2019 17:37   Result Date: 05/10/2019 CLINICAL DATA:  Low back pain, left radiculopathy EXAM: MRI LUMBAR SPINE WITHOUT AND WITH CONTRAST TECHNIQUE: Multiplanar and multiecho pulse sequences of the lumbar spine were obtained without and with intravenous contrast. CONTRAST:  39mL GADAVIST GADOBUTROL 1 MMOL/ML IV SOLN COMPARISON:  None. FINDINGS: Segmentation:  Standard. Alignment:  Trace retrolisthesis at L3-L4 Vertebrae: Small Schmorl's nodes are present. No compression deformity. There are STIR hyperintense lesions involving all levels as well as the bony pelvis. No evidence of significant epidural disease extension. Conus medullaris and cauda equina: Conus extends to the L1 level. Conus and cauda equina appear normal. Paraspinal and other soft tissues: Unremarkable. Disc levels: Imaged in the sagittal plane only, there is a small central disc protrusion and annular fissure at  T11-T12. L1-L2: Mild facet hypertrophy and prominence of dorsal epidural fat. No significant canal or foraminal stenosis. L2-L3: Mild facet hypertrophy and prominence of dorsal epidural fat. No significant canal or foraminal stenosis. L3-L4: Left subarticular/foraminal disc protrusion. Mild facet arthropathy with ligamentum flavum infolding and mild prominence of dorsal epidural fat. No significant canal stenosis. Effacement of the left lateral recess with compression of the traversing L4 nerve root. No right foraminal stenosis. Mild left foraminal stenosis. L4-L5: Mild disc bulge. Mild facet arthropathy with ligamentum flavum infolding and mild prominence of dorsal epidural fat. No significant canal or foraminal stenosis. L5-S1: Mild facet arthropathy and prominence of epidural fat. No significant canal or foraminal stenosis. IMPRESSION: Multilevel, multifocal osseous lesions suspicious for metastatic disease. No compression deformity or epidural disease. Disc protrusion at L3-L4 compressing the left L4 nerve root. Electronically Signed: By: Macy Mis M.D. On: 05/10/2019 16:38   CT ABDOMEN PELVIS W CONTRAST  Result Date: 05/10/2019 CLINICAL DATA:  Cancer of unknown primary, worsening headaches, neck and back pain with numerous intracranial lesions worrisome for metastatic disease EXAM: CT CHEST, ABDOMEN, AND PELVIS WITH CONTRAST TECHNIQUE: Multidetector CT imaging of the chest, abdomen and pelvis was performed following the standard protocol during bolus administration of intravenous contrast. CONTRAST:  146mL OMNIPAQUE IOHEXOL 300 MG/ML  SOLN COMPARISON:  Numerous comparison radiographs of the chest, most recently 05/06/2019 FINDINGS: CT CHEST FINDINGS Cardiovascular: The aortic root is suboptimally assessed given cardiac pulsation artifact. The aorta is normal caliber without acute luminal or periaortic abnormality. Normal 3 vessel branching of the aortic arch. Proximal great vessels opacified normally.  Central pulmonary arteries are normal caliber. No large central filling defects are identified on this non tailored examination of the pulmonary arteries. Normal heart size. No pericardial effusion. Mediastinum/Nodes: Multiple right paratracheal and hilar lymph nodes are present with some low central attenuation which may suggest necrosis, representative nodes include a 12 mm low right paratracheal node (4R, 3/74), 10 mm low right precarinal node (4R, 2/21), and a a 19 mm right hilar lymph node (10R, 2/44). Lungs/Pleura: In the right lung apex is a 3.0 x 3.0 x 2.8 cm lesion with spiculated margins extending to both the pleural  surface and superior right hilum. Additional subsided sub 5 mm nodules are seen in the left lung apex (6/40, 6/56), superior segment left lower lobe (6/59). No consolidation. No convincing features of edema. No pneumothorax or effusion. Musculoskeletal: There are numerous lytic appearing lesions throughout the thoracic spine involving both the vertebral bodies and posterior elements of multiple levels. Notably, a hypoattenuating lesion in the L3 vertebral body demonstrates a superimposed pathologic compression deformity with approximately 30% height loss. No worrisome chest wall lesion. CT ABDOMEN PELVIS FINDINGS Hepatobiliary: There are innumerable peripherally enhancing centrally hypoattenuating lesions seen throughout the liver. The largest lesion is seen within the caudate lobe measuring up to 2.8 cm in size (2/47). An additional 2.4 cm lesion in the left lobe liver (2/52). Additional geographic region of hyperattenuation in the periphery of the right upper lobe, may reflect a transient hepatic attenuation difference though certainly an underlying lesion in this location is not excluded. Gallbladder is unremarkable. No calcified gallstones. No biliary ductal dilatation. Pancreas: Unremarkable. No pancreatic ductal dilatation or surrounding inflammatory changes. Spleen: Normal in size  without focal abnormality. Adrenals/Urinary Tract: Adrenal glands are unremarkable. Kidneys are normal, without renal calculi, focal lesion, or hydronephrosis. Bladder is unremarkable. High attenuation material within the urinary bladder likely related to recent contrast enhanced brain MRI. Stomach/Bowel: Distal esophagus, stomach and duodenal sweep are unremarkable. No small bowel wall thickening or dilatation. A normal appendix is visualized. No colonic dilatation or wall thickening. No evidence of obstruction. Vascular/Lymphatic: No significant vascular findings are present. No enlarged abdominal or pelvic lymph nodes. Reproductive: The prostate and seminal vesicles are unremarkable. Other: No abdominopelvic free fluid or air. Small fat containing inguinal hernias. No bowel containing hernia. Musculoskeletal: There are numerous lucent lesions within the vertebral bodies and transverse processes of the lumbar spine compatible with osseous metastatic disease. Additional lucent lesions present within both ilia including a lesion in the left iliac wing which demonstrates some soft tissue extension (2/90). IMPRESSION: 1. Thick-walled cavitary lesion in the left lung apex with spiculated margins extending to both the pleural surface and superior right hilum, concerning for primary lung malignancy. Appearance could suggest a squamous cell carcinoma the lung given the location and cavitation though is by no means a diagnostic finding. 2. Multiple additional contralateral sub 5 mm lymph nodes, could be infectious or inflammatory though should be certainly viewed with suspicion and for metastatic disease given other findings throughout the body. 3. Numerous necrotic appearing is lateral mediastinal and right hilar lymph nodes, likely metastatic. 4. Innumerable rim enhancing hepatic lesions throughout both lobes of the liver compatible with solid organ metastasis. Additional geographic region of hyperattenuation in the  periphery of the right upper lobe, may reflect a transient hepatic attenuation difference though certainly an underlying lesion in this location is not excluded. Attention on follow-up recommended. 5. There are multiple lytic appearing lesions throughout the thoracic and lumbar spine as well as portions of the bony pelvis. 6. Soft tissue extension of an osseous lesion involving the left iliac wing. 7. Pathologic split type compression fracture of the T3 vertebral body with approximately 30% height loss. These results were called by telephone at the time of interpretation on 05/10/2019 at 10:05 pm to provider Nattie Lazenby , who verbally acknowledged these results. Electronically Signed   By: Lovena Le M.D.   On: 05/10/2019 22:05   MR ORBITS W CONTRAST  Result Date: 05/10/2019 CLINICAL DATA:  Left retinal lesion, headaches EXAM: MRI HEAD AND ORBITS WITHOUT AND WITH CONTRAST TECHNIQUE:  Multiplanar, multiecho pulse sequences of the brain and surrounding structures were obtained without and with intravenous contrast. Multiplanar, multiecho pulse sequences of the orbits and surrounding structures were obtained including fat saturation techniques, before and after intravenous contrast administration. CONTRAST:  74mL GADAVIST GADOBUTROL 1 MMOL/ML IV SOLN COMPARISON:  None. FINDINGS: Intravenous contrast was injected for MRI of the lumbar spine shortly before this study. Therefore, contrast remains present and all images are effectively postcontrast MRI HEAD FINDINGS Brain: There are multiple (greater than 30) subcentimeter parenchymal enhancing lesions including involvement of the cerebellum and right midbrain tectum. There is mild edema associated with some of the lesions. A minority also demonstrate corresponding susceptibility, which may reflect intralesional hemorrhage or mineralization. There is no acute infarction. No hydrocephalus. No significant mass effect. Vascular: Major vessel flow voids at the skull base  are preserved. Skull and upper cervical spine: Probable areas of abnormal marrow enhancement at the skull base and C2 posterior elements Other: Mastoid air cells are clear. MRI ORBITS FINDINGS Orbits: No proptosis. Suspected thin enhancing tissue at the posterior aspect of the left globe. Extraocular muscles are symmetric and unremarkable. There is no abnormal enhancement of the optic nerve sheath complexes. Visualized sinuses: Minor mucosal thickening. Soft tissues: Unremarkable. IMPRESSION: Numerous enhancing parenchymal intracranial lesions likely reflecting metastases. Mild associated edema. No mass effect. Probable areas of abnormal marrow enhancement reflecting osseous metastases at the skull base and upper cervical spine. Suspected abnormal thin enhancing tissue at the posterior aspect of the left lobe. Electronically Signed   By: Macy Mis M.D.   On: 05/10/2019 19:29     MDM   MDM per handoff provider: Daishawn KRYSTLE Ali is a 34 y.o. male with pertinent past medical history of hypertension presents to the emergency department today for worsening headache and left pelvis pain. Due to his HA progression and focal neuro findings will order a MRI of brain, orbits, neck, and thoracic spine. Will also order plain films of his pelvis and basic blood work. He had labs done and [place] orbits done yesterday which showed retinal lesion. Since pt is unable to provide further detail I am concerned about MS, brain mass, GCA, or other intracranial or spinal cord abnormality.  Awaiting MRI. If MRI comes back positive for lesion, etc pt will be admitted. If negative will discharge with neuro follow up and follow up with [facility] opthalmology. If negative, will treat symptoms. I discussed importance of taking BP medications. At shift change care was transferred toGarrett Boy Delamater, PA-Cwho will follow pending studies, re-evaluate, and determine disposition.   On my examination, patient states that his headache has been  progressively worsening over the course of the past 2 weeks and that he subsequently developed blurred vision in his left eye.  In that same period, he is also endorsing weakening in his left thigh as well as decreased sensation in his penis and groin.  He denies any urinary retention or incontinence symptoms.  Patient does describe some low back discomfort that he has noticed in the past week.  He is accompanied by his wife reports that his symptoms of back discomfort have lasted longer.  Lab work is significant for unexplained transaminitis and elevated alkaline phosphatase.  Patient also has a mildly elevated creatinine 1.37, however consistent with baseline.  He also has a mild leukocytosis to 11.9.  MRI lumbar spine demonstrates multifocal, multilevel osseous STIR hyperintense lesions concerning for metastatic disease.  No obvious epidural disease.  L4 nerve root compression.  Spoke with Maryruth Hancock who will review scan and get back to me.  She states that it does look like degenerative changes on the left side with nerve root compression which could explain his symptoms.  She recommended Decadron 10 mg to see if that improves his symptoms.  MRI orbits and brain is personally reviewed and demonstrates numerous enhancing parenchymal intracranial lesions likely reflective of metastases.  No orbital pathology.  Will consult with oncology to determine whether or not further imaging such as CT abdomen and pelvis as well as CT chest to evaluate for primary malignancy is warranted here in the ED or if this would be better managed outpatient.  Spoke with Dr. Narda Rutherford who recommended CT of chest, abdomen, and pelvis with contrast to assess for primary malignancy.  Patient can be discharged with close outpatient oncologic follow-up.  CT obtained of chest abdomen and pelvis demonstrates cavitary lesion in right upper lung that may represent squamous cell carcinoma.  There is also inflamed lymph nodes  concerning for metastatic disease as well as innumerable lesions in liver concerning for solid organ metastases.  Spoke with radiology and evidently the pulmonary nodule in his right upper lung that is likely the primary lesion had been noticed multiple times on plain films during ED encounters.  I reviewed those encounters on 04/24/2018 and 05/01/2018 where there were explicit instructions to have lung nodule followed up outpatient including but not limited to CT to evaluate for primary malignancy.   Spoke with Dr. Narda Rutherford again regarding his findings.  Will likely plan for outpatient liver biopsy as it is more easily accessible than the lung malignancy.  Will provide patient with a Vicodin here in the ED prior to discharge and sent home with a short course of analgesics.  Instructed him to continue taking his other medications, as prescribed.  Strict ED return precautions discussed.  All of the evaluation and work-up results were discussed with the patient and any family at bedside. They were provided opportunity to ask any additional questions and have none at this time. They have expressed understanding of verbal discharge instructions as well as return precautions and are agreeable to the plan.         Corena Herter, PA-C 05/10/19 2327    Lucrezia Starch, MD 05/10/19 [phone]

## 2019-05-10 NOTE — ED Provider Notes (Signed)
[facility] DEPT Provider Note   CSN: [id number] Arrival date & time: 05/10/19  1050     History Chief Complaint  Patient presents with  . Headache  . Leg Pain    William Ali is a 34 y.o. male with pertinent past medical history of hypertension presents to the emergency department today for worsening headache and left pelvis pain.  He states that he has had 1 year of new headaches that he describes at the back of his head that moves towards the front of his head bilaterally.  For the past month they have been worsening and he has been taking about 8 ibuprofen and 1000 mg of Tylenol daily for them.  He states that this does help him get through the night but wakes up with a headache. They are constant and he has not had a headache free day this month.  He also reports that his head is tender to touch and feels like pins-and-needles every time he touches his head.  He also admits to pain when he chews.  He states that the pain in his head radiates down to his left side of his neck. No history of migraines.  He also states that for the past week and a half he has had some left eye central vision loss which has never occurred before.  He went to his neurologist in [place] who referred him to [facility] ophthalmology.  He was seen there yesterday and had a multitude of lab tests and an orbital ultrasound which showed a retinal lesion with surrounding subretinal fluid.  He is unable to answer any questions about that visit and is unsure about his follow-up. Chart review is not available for that visit. He reports that he has not been taking his blood pressure medication for the past 2 weeks.  He denies any chest pain, shortness of breath, dizziness, speech difficulty, syncope, eye pain, diplopia, color vision loss, gait abnormalities. Wife is present in the room and does think that pt has been at baseline, cognitively.   In regards to his left hip pain, he states that this  has been occurring for the past 2 weeks.  He denies any trauma or falls.  He admits to the pain starting in his hip and radiating down into his leg,which makes it difficult to walk.  He also admits to some weakness in his calf muscle that he has noticed over this time.  He denies any back pain, fevers, chills. He does admit to two weeks of night sweats. He denies any saddle paresthesias, incontinence, dysuria.   HPI     Past Medical History:  Diagnosis Date  . Back pain   . Hypertension     There are no problems to display for this patient.   Past Surgical History:  Procedure Laterality Date  . KNEE SURGERY         Family History  Problem Relation Age of Onset  . Hypertension Mother   . Diabetes Father     Social History   Tobacco Use  . Smoking status: Never Smoker  . Smokeless tobacco: Never Used  Substance Use Topics  . Alcohol use: No  . Drug use: No    Home Medications Prior to Admission medications   Medication Sig Start Date End Date Taking? Authorizing Provider  amoxicillin-clavulanate (AUGMENTIN) 875-125 MG tablet Take 1 tablet by mouth 2 (two) times daily for 10 days. 05/05/19 05/15/19  Darr, Marguerita Beards, PA-C  azithromycin (ZITHROMAX Z-PAK)  250 MG tablet 2 po day one, then 1 daily x 4 days Patient not taking: Reported on 05/06/2019 04/23/19   Veryl Speak, MD  benzonatate (TESSALON) 100 MG capsule Take 1 capsule (100 mg total) by mouth every 8 (eight) hours. 05/05/19   Darr, Marguerita Beards, PA-C  butalbital-acetaminophen-caffeine (FIORICET) [phone] MG tablet Take 1 tablet by mouth every 6 (six) hours as needed for headache. Patient not taking: Reported on 05/06/2019 05/28/18 05/28/19  Scot Jun, FNP  cetirizine (ZYRTEC) 10 MG tablet Take 1 tablet (10 mg total) by mouth daily. Patient not taking: Reported on 05/06/2019 11/23/18   Augusto Gamble B, NP  cyclobenzaprine (FLEXERIL) 10 MG tablet Take 1 tablet by mouth 3 times daily as needed for muscle spasm. Warning: May  cause drowsiness. Patient not taking: Reported on 05/06/2019 02/07/19   Vanessa Kick, MD  fluticasone [facility]) 50 MCG/ACT nasal spray Place 1 spray into both nostrils daily. Patient not taking: Reported on 05/06/2019 11/23/18   Augusto Gamble B, NP  hydrochlorothiazide (HYDRODIURIL) 25 MG tablet Take 1 tablet (25 mg total) by mouth daily. 07/21/18   Chase Picket, MD  ibuprofen (ADVIL) 800 MG tablet Take 1 tablet (800 mg total) by mouth 3 (three) times daily with meals. Patient not taking: Reported on 05/06/2019 02/07/19   Vanessa Kick, MD  meloxicam (MOBIC) 15 MG tablet Take 1 tablet (15 mg total) by mouth daily. Patient not taking: Reported on 05/06/2019 02/17/19   Recardo Evangelist, PA-C  pantoprazole (PROTONIX) 40 MG tablet Take 40 mg by mouth daily. 04/11/19   [provider]  sodium chloride (OCEAN) 0.65 % SOLN nasal spray Place 1 spray into both nostrils as needed for congestion. 05/05/19   Darr, Marguerita Beards, PA-C  traMADol (ULTRAM) 50 MG tablet Take 1 tablet (50 mg total) by mouth every 6 (six) hours as needed. 05/06/19   Milton Ferguson, MD  amLODipine (NORVASC) 5 MG tablet Take 1 tablet (5 mg total) by mouth daily. 04/21/18 07/21/18  Robyn Haber, MD  famotidine (PEPCID) 20 MG tablet Take 1 tablet (20 mg total) by mouth 2 (two) times daily. Patient taking differently: Take 20 mg by mouth 2 (two) times daily as needed for heartburn.  04/21/18 07/21/18  Robyn Haber, MD    Allergies    Onion, Other, Peanut oil, and Peanut-containing drug products  Review of Systems   Review of Systems  Eyes: Positive for visual disturbance.  Neurological: Positive for weakness and headaches.    10 systems are reviewed and are negative for acute changes, except as noted in the HPI.  Physical Exam Updated Vital Signs BP (!) 159/101 (BP Location: Left Arm)   Pulse 93   Temp 97.6 F (36.4 C) (Oral)   Resp 17   SpO2 100%   Physical Exam  PE: Constitutional: well-developed, well-nourished,  no apparent distress Head: tender to touch entire head down to left neck. TMJ tender. Forehead tender to touch. Rest of face nontender to touch.  Eyes: Left eye with central vision loss, normal on right side. Conjuctiva clear, PERRLA, EOMS intact. (Fundoscopic exam done yesterday at [facility]) Cardiovascular: normal rate and rhythm, distal pulses intact Pulmonary/Chest: effort normal; breath sounds clear and equal bilaterally; no wheezes or rales Abdominal: soft and nontender Musculoskeletal: posterior iliac crest tenderness on left side, able to move left hip and knee and ankle in all directions. Strength 3/5 on left hip and left knee. Strength 5/5 of left ankle. Left leg sensation intact. Distal pulses intact. Right  side normal. Lymphadenopathy: no cervical adenopathy Neurological: Alert. Clear speech. No facial droop. CNIII-XII grossly intact. Bilateral upper and lower extremities' sensation grossly intact. 5/5 symmetric upper extremity strength. Patellar DTRs are 2+ on right side, 1+ on left side, not symmetric . Gait unacessed. Skin: warm and dry, no rash, no diaphoresis Psychiatric: normal mood and affect, normal behavior     ED Results / Procedures / Treatments   Labs (all labs ordered are listed, but only abnormal results are displayed) Labs Reviewed - No data to display  EKG None  Radiology No results found.  Procedures Procedures (including critical care time)  Medications Ordered in ED Medications - No data to display  ED Course  I have reviewed the triage vital signs and the nursing notes.  Pertinent labs & imaging results that were available during my care of the patient were reviewed by me and considered in my medical decision making (see chart for details).    MDM Rules/Calculators/A&P                      William Ali is a 35 y.o. male with pertinent past medical history of hypertension presents to the emergency department today for worsening headache and left  pelvis pain. Due to his HA progression and focal neuro findings will order a MRI of brain, orbits, neck, and thoracic spine. Will also order plain films of his pelvis and basic blood work. He had labs done and [place] orbits done yesterday which showed retinal lesion. Since pt is unable to provide further detail I am concerned about MS, brain mass, GCA, or other intracranial or spinal cord abnormality.  Awaiting MRI. If MRI comes back positive for lesion, ect pt will be admitted. If negative will discharge with neuro follow up and follow up with [facility] opthalmology. If negative, will treat symptoms. I discussed importance of taking BP medications. At shift change care was transferred to Krista Blue, PA-C who will follow pending studies, re-evaluate, and determine disposition.      Final Clinical Impression(s) / ED Diagnoses Final diagnoses:  None    Rx / DC Orders ED Discharge Orders    None       Alfredia Client, PA-C 05/10/19 1537    [name], MD 05/10/19 1549

## 2019-05-10 NOTE — ED Provider Notes (Signed)
1:27 PM Patient seen in conjunction with William Ali.   Patient with 1 year of chronic, progressive headaches and neck pain -- presents to the emergency department with worsening of symptoms.  Patient has developed left central vision loss over the past week or so.  He went to an eye doctor and then was referred to ophthalmology at [facility] and had an exam yesterday.  See results below.  Patient presents today with complaint of worsening headaches, neck pain, lower back pain.  He has weakness in his left leg and pain in the posterior and lateral portion of the pelvis which is causing him to "yell out in pain".   Fundus Exam Fundus Exam   Right eye Left eye  Disc Normal Rim Normal Rim  C/D Ratio 0.2 0.2  Macula Normal SRF  Vessels Normal 3x3 pigmented lesion, no drusen, ++SRF  Periphery Normal Normal    Patient is unable to give details about the findings from his exam yesterday.  Care everywhere demonstrates that he has a pigmented lesion in the left eye with subretinal fluid.   Given patient's objective findings of vision loss, headaches, leg weakness, neck pain --feel that he needs further evaluation today to rule out central etiology of symptoms, mass lesions, demyelinating disease, or spinal cord compression.  Patient had a battery of lab tests performed yesterday.  Alk phos noted to be elevated into the 200s.  Discussed with Dr. Vanita Panda.  Imaging ordered.  Will treat symptoms in the interim.  BP (!) 148/78   Pulse 88   Temp 97.6 F (36.4 C) (Oral)   Resp 18   SpO2 100%   3:36 PM Green PA-C and Dr. Roslynn Amble aware at shift change.       [name], PA-C 05/10/19 1536    [name], MD 05/10/19 1547

## 2019-05-10 NOTE — ED Triage Notes (Addendum)
Pt presents with c/o headaches for approx one month and pain in his left hip down to his left leg as well present for approx one week. Pt denies any recent injuries. Pt denies any hx of migraines. Pt is hypertensive in triage. Also reports that he went to see the eye doctor yesterday and was told he has something behind his eye, unknown whether it is fluid.

## 2019-05-10 NOTE — ED Notes (Addendum)
MRI is otw to get patient to try MRI X2. Ativan given per PA.

## 2019-05-10 NOTE — ED Notes (Addendum)
Patient transported to x-ray. ?

## 2019-05-10 NOTE — ED Notes (Addendum)
ED Provider at bedside. 

## 2019-05-10 NOTE — ED Notes (Signed)
Patient transported to MRI 

## 2019-05-10 NOTE — Progress Notes (Signed)
Attempted pt MRI on 05-10-19. Pt got in the scanner and started to feel like he could not breathe. RN was called and came to administer ativan. Medicine did not help pt. The techs were able to convince pt to do lspine due to the fact he wouldn't go all the way in and a tech would stay in the room with the pt. Pt was able to get through lspine but was not able to do brain, orbits, and cspine. Pt was attempted multiple times.

## 2019-05-10 NOTE — Discharge Instructions (Signed)
You should receive a phone call from the office of Dr. Lorenso Courier, hematology oncology, tomorrow morning.  If you do not, please call the number to schedule appointment for ongoing evaluation and management.  Please take the medication, as prescribed.  Only take for breakthrough pain that is controlled with Tylenol and ibuprofen.  Return to the ED or seek immediate medical attention for any new or worsening symptoms.

## 2019-05-11 ENCOUNTER — Other Ambulatory Visit: Payer: Self-pay | Admitting: Hematology and Oncology

## 2019-05-11 DIAGNOSIS — C771 Secondary and unspecified malignant neoplasm of intrathoracic lymph nodes: Secondary | ICD-10-CM

## 2019-05-12 ENCOUNTER — Telehealth: Payer: Self-pay | Admitting: Hematology and Oncology

## 2019-05-12 ENCOUNTER — Encounter (HOSPITAL_COMMUNITY): Payer: Self-pay | Admitting: Radiology

## 2019-05-12 MED ORDER — DEXAMETHASONE 2 MG PO TABS: 4.0000 mg | ORAL_TABLET | Freq: Every day | ORAL | 1 refills | Status: DC

## 2019-05-12 NOTE — Telephone Encounter (Signed)
A new patient appointment has been scheduled for Mr. William Ali to see Dr. Lorenso Courier on 4/23 at 11am w/labs at 12:15pm. Mr. William Ali has been cld and agreed to the appt date and time.

## 2019-05-12 NOTE — Progress Notes (Signed)
William Ali Male, 34 y.o., 1985/06/03 MRN:  [id number] Phone:  [phone] Jerilynn Mages) PCP:  Patient, No Pcp Per Coverage:  Medicaid [place]/Medicaid [place] Next Appt With Radiology ([facility]-US 2) 05/16/2019 at 1:00 PM  RE: CT Biopsy Received: Today Message Contents  Corrie Mckusick, DO  Garth Bigness D  OK for attempt at US guided liver mass biopsy.   Signed,   Dulcy Fanny. 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 Requesting liver met biopsy   History: MR, CT in computer   Provider: Ledell Peoples IV   Provider Contact: [phone]

## 2019-05-12 NOTE — Telephone Encounter (Signed)
Called Mr. Hain to introduce myself and discuss the plan moving forward. I told him I have reviewed his scans and want to move forward with a biopsy of one of the metastatic lesions. The easiest one appears to be in the liver. Order placed for CT guided biopsy (though US guided may be feasible).  Additionally I will call in decadron 4mg  daily to try and help with his headaches. We will plan to see him on 05/19/2019 in clinic, hopefully with some preliminary results from the biopsy.   Ledell Peoples, MD Department of Hematology/Oncology [place] [facility] Phone: [phone] Pager: [phone] Email: [email or web address]

## 2019-05-15 ENCOUNTER — Telehealth: Payer: Self-pay | Admitting: *Deleted

## 2019-05-15 ENCOUNTER — Other Ambulatory Visit: Payer: Self-pay | Admitting: Radiology

## 2019-05-15 NOTE — Telephone Encounter (Signed)
Received call from patient.  He is experiencing some symptoms and wanted to know if he should come here sooner than Friday.  He is having a liver biopsy tomorrow.  He states his main symptoms continue to be pain in his back, hip and headaches.  He also says he feels SOB at times, esp after a coughing spell. He also has occasional wheezing. He states he tried his daughter's albuterol inhaler and felt a little better after that.   He is currently taking Dexamethasone 4mg  daily, Vicodin 5/325 only at night, Tessalon Pearles for his cough, pantoprazole and HCTZ for his HTN.  He will complete his Augmentin tomorrow.  He is not taking anything for pain during the day.He does have Tramadol ordered for pain but has not used it. He doesn't want to take the vicodin during the day because it makes him feel "foggy-headed"  Advised patient (wife on phone as well) to try taking the tramadol during the day to see if it will help with both hip and back as well as headache. Advised to continue the Tessalon Pearles for cough. Advised that if SOB gets worse that he should go to ED for eval.  Encouraged increased fluid intake to stay hydrated-he stated he was not drinking much fluid lately. He understands about the liver biopsy tomorrow. Advised to give this writer a call on Wednesday morning to let me know how the pain is doing with the tramdaol.   He is scheduled to see Dr. Lorenso Courier on Friday to review results of liver biopsy. Advised that his wife can come with him on Friday as it would be important for her to hear results of biopsy as well.  They both voiced understanding to the above instructions.

## 2019-05-16 ENCOUNTER — Other Ambulatory Visit: Payer: Self-pay | Admitting: Anatomic Pathology & Clinical Pathology

## 2019-05-16 ENCOUNTER — Other Ambulatory Visit: Payer: Self-pay

## 2019-05-16 ENCOUNTER — Ambulatory Visit (HOSPITAL_COMMUNITY)
Admission: RE | Admit: 2019-05-16 | Discharge: 2019-05-16 | Disposition: A | Discharge status: Home / Self Care | Payer: Self-pay | Source: Ambulatory Visit | Attending: Hematology and Oncology | Admitting: Hematology and Oncology

## 2019-05-16 DIAGNOSIS — Z7901 Long term (current) use of anticoagulants: Secondary | ICD-10-CM | POA: Insufficient documentation

## 2019-05-16 DIAGNOSIS — Z79899 Other long term (current) drug therapy: Secondary | ICD-10-CM | POA: Insufficient documentation

## 2019-05-16 DIAGNOSIS — C771 Secondary and unspecified malignant neoplasm of intrathoracic lymph nodes: Secondary | ICD-10-CM | POA: Insufficient documentation

## 2019-05-16 DIAGNOSIS — K769 Liver disease, unspecified: Secondary | ICD-10-CM | POA: Insufficient documentation

## 2019-05-16 DIAGNOSIS — Z7902 Long term (current) use of antithrombotics/antiplatelets: Secondary | ICD-10-CM | POA: Insufficient documentation

## 2019-05-16 LAB — CBC
HCT: 44.2 % (ref 39.0–52.0)
Hemoglobin: 14.2 g/dL (ref 13.0–17.0)
MCH: 26.5 pg (ref 26.0–34.0)
MCHC: 32.1 g/dL (ref 30.0–36.0)
MCV: 82.6 fL (ref 80.0–100.0)
Platelets: 393 10*3/uL (ref 150–400)
RBC: 5.35 MIL/uL (ref 4.22–5.81)
RDW: 14.7 % (ref 11.5–15.5)
WBC: 13.9 10*3/uL — ABNORMAL HIGH (ref 4.0–10.5)
nRBC: 0 % (ref 0.0–0.2)

## 2019-05-16 LAB — PROTIME-INR
INR: 1.1 (ref 0.8–1.2)
Prothrombin Time: 14.3 seconds (ref 11.4–15.2)

## 2019-05-16 IMAGING — US US BIOPSY CORE LIVER
1 series · 13 of 15 positions shown · non-contrast
Comparison: none

CLINICAL DATA: Multiple liver lesions worrisome for metastatic
disease

EXAM:
ULTRASOUND-GUIDED CORE LIVER LESION BIOPSY
TECHNIQUE: An ultrasound guided liver biopsy was thoroughly discussed with the
patient and questions were answered. The benefits, risks,
alternatives, and complications were also discussed. The patient
understands and wishes to proceed with the procedure. A verbal as
well as written consent was obtained.

[Series 1: us biopsy (liver) · 13 of 15 slices shown]
[im 1/15]
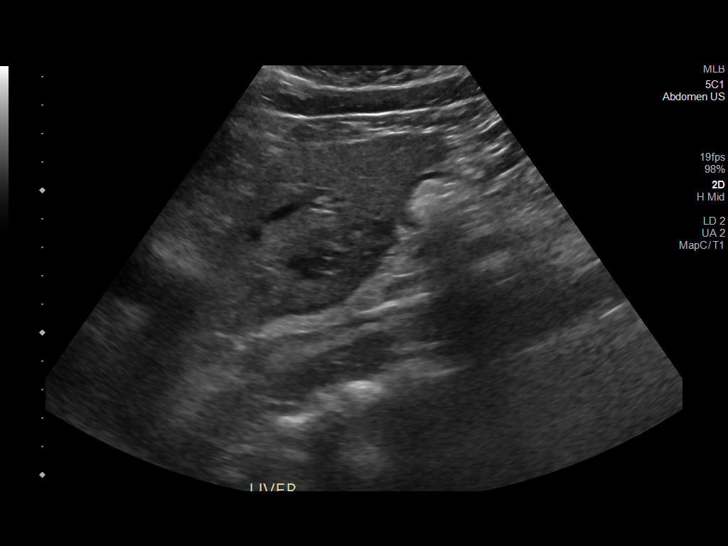
[im 2/15]
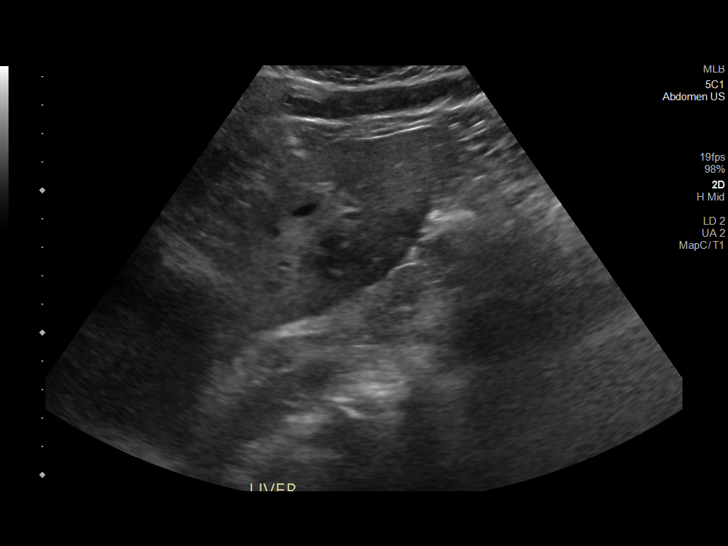
[im 3/15]
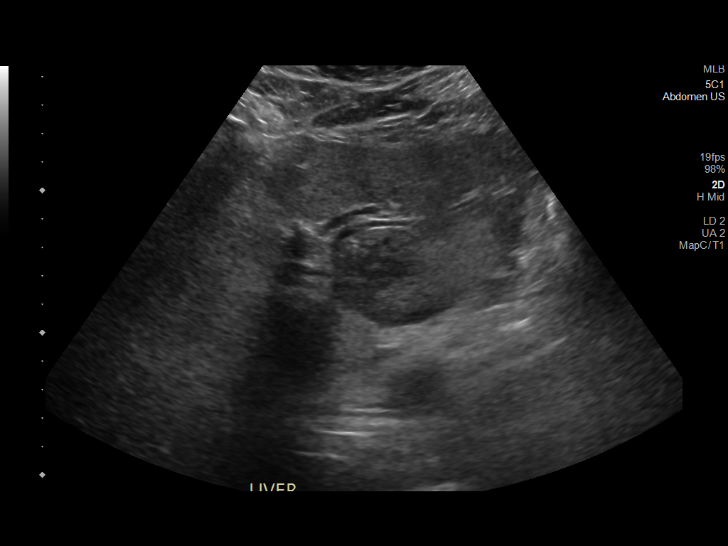
[im 5/15]
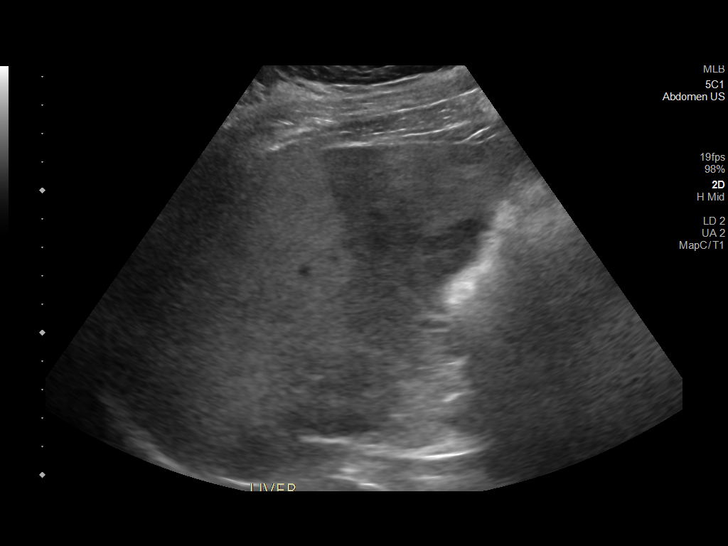
[im 6/15]
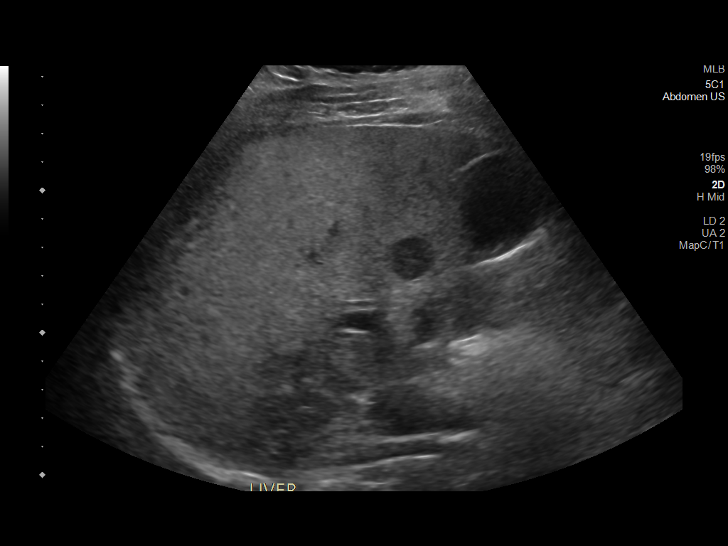
[im 7/15]
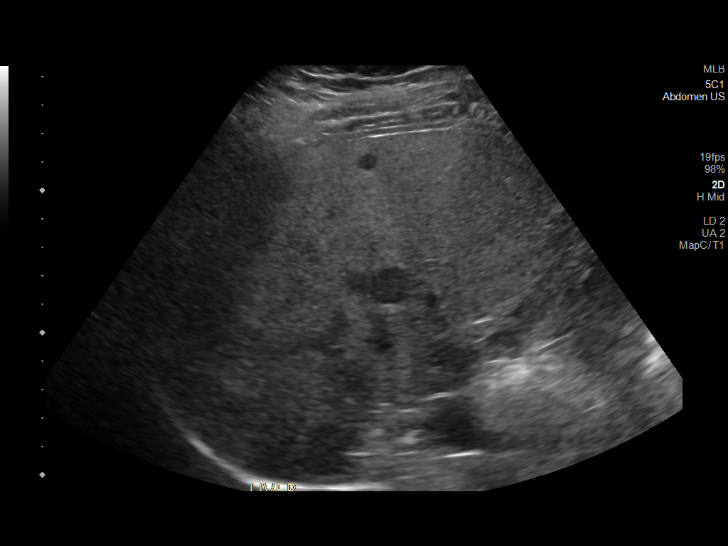
[im 8/15]
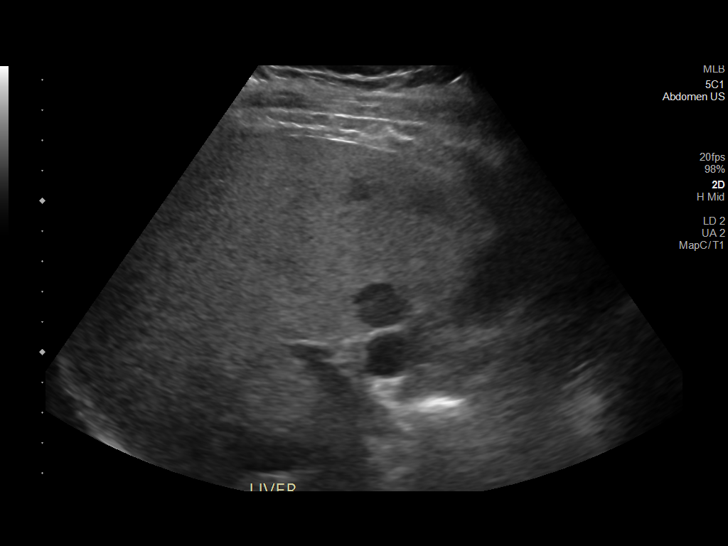
[im 9/15]
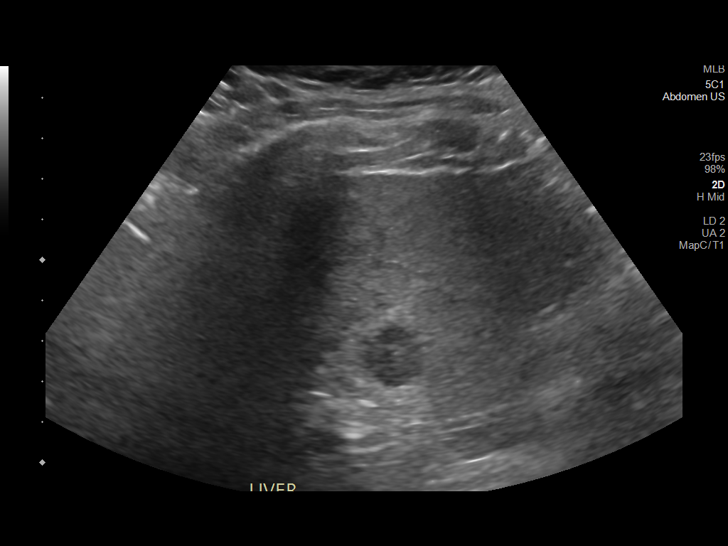
[im 10/15]
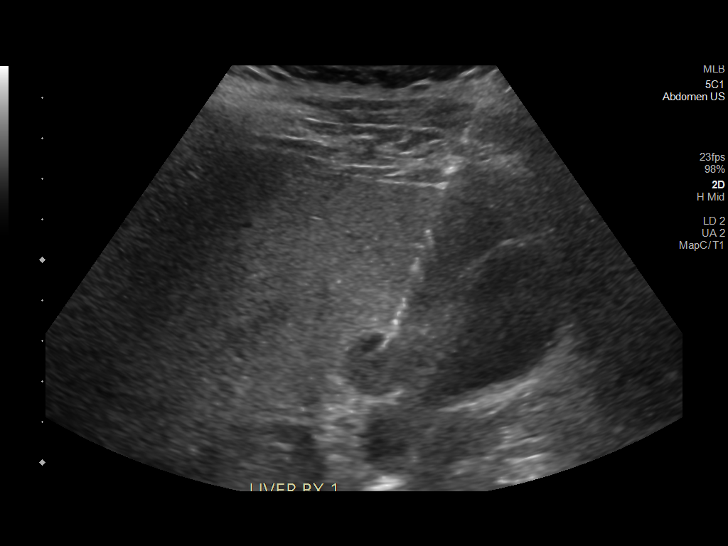
[im 11/15]
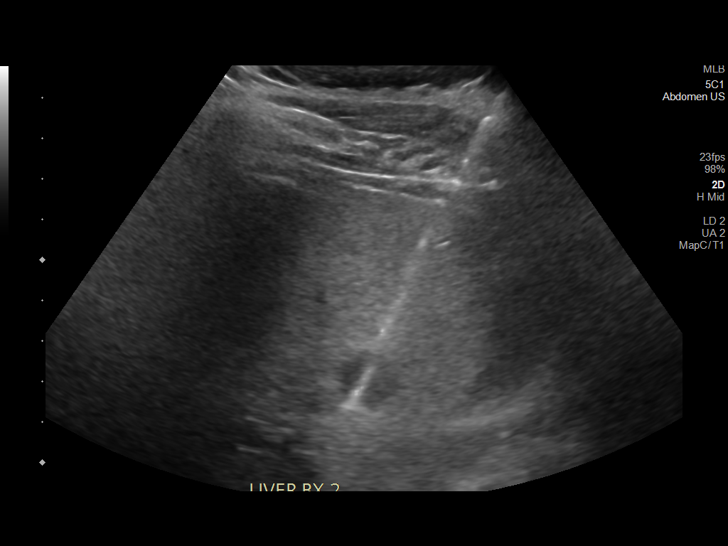
[im 13/15]
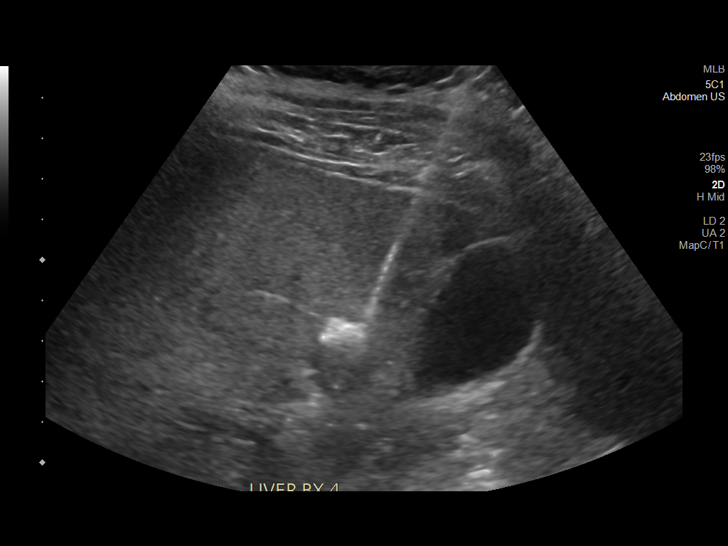
[im 14/15]
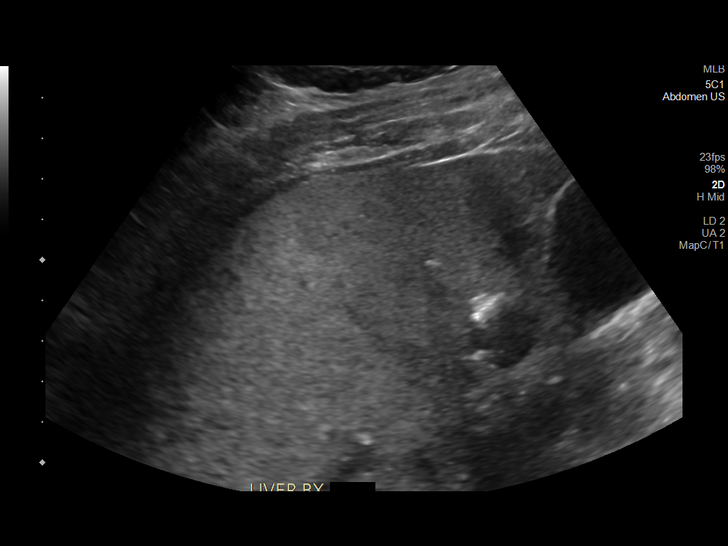
[im 15/15]
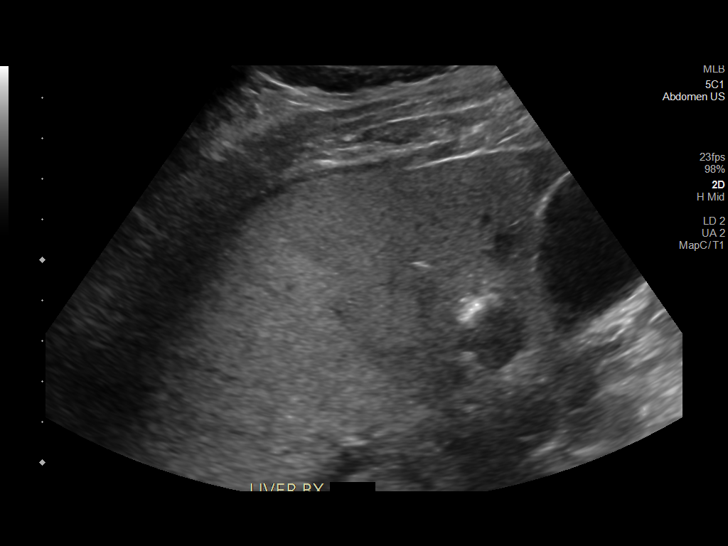

[13 of 15 positions shown; findings below may reference images not displayed]

Survey ultrasound of the liver was performed, a representative
lesion identified, and an appropriate skin entry site was
determined. Skin site was marked, prepped with chlorhexidine, and
draped in usual sterile fashion, and infiltrated locally with 1%
lidocaine.

Intravenous Fentanyl [RJ] and Versed 2mg were administered as
conscious sedation during continuous monitoring of the patient's
level of consciousness and physiological / cardiorespiratory status
by the radiology RN, with a total moderate sedation time of 12
minutes.

a 17 gauge trocar needle was advanced under ultrasound guidance into
the liver to the margin of the lesion. 3 coaxial [RJ] core
samples were then obtained through the guide needle. The guide
needle was removed. Post procedure scans demonstrate no apparent
complication.

COMPLICATIONS:
COMPLICATIONS
None immediate
FINDINGS: Multiple hypoechoic lesions were identified corresponding to CT
findings. Representative core biopsy samples obtained as above.
IMPRESSION: 1. Technically successful ultrasound guided core liver lesion
biopsy.

## 2019-05-16 MED ORDER — FENTANYL CITRATE (PF) 100 MCG/2ML IJ SOLN
INTRAMUSCULAR | Status: AC
  Filled 2019-05-16: qty 2

## 2019-05-16 MED ORDER — SODIUM CHLORIDE 0.9 % IV SOLN: INTRAVENOUS | Status: DC

## 2019-05-16 MED ORDER — MIDAZOLAM HCL 2 MG/2ML IJ SOLN
INTRAMUSCULAR | Status: AC | PRN
  Administered 2019-05-16 (×2): 1 mg via INTRAVENOUS

## 2019-05-16 MED ORDER — MIDAZOLAM HCL 2 MG/2ML IJ SOLN
INTRAMUSCULAR | Status: AC
  Filled 2019-05-16: qty 2

## 2019-05-16 MED ORDER — HYDROCODONE-ACETAMINOPHEN 5-325 MG PO TABS
ORAL_TABLET | ORAL | Status: AC
  Administered 2019-05-16: 1
  Filled 2019-05-16: qty 1

## 2019-05-16 MED ORDER — FENTANYL CITRATE (PF) 100 MCG/2ML IJ SOLN
INTRAMUSCULAR | Status: AC | PRN
  Administered 2019-05-16 (×2): 50 ug via INTRAVENOUS

## 2019-05-16 MED ORDER — LIDOCAINE HCL (PF) 1 % IJ SOLN
INTRAMUSCULAR | Status: AC
  Filled 2019-05-16: qty 30

## 2019-05-16 MED ORDER — HYDROCODONE-ACETAMINOPHEN 5-325 MG PO TABS: 1.0000 | ORAL_TABLET | ORAL | Status: DC | PRN

## 2019-05-16 MED ORDER — SODIUM CHLORIDE 0.9 % IV SOLN
INTRAVENOUS | Status: AC | PRN
  Administered 2019-05-16: 10 mL/h via INTRAVENOUS

## 2019-05-16 MED ORDER — GELATIN ABSORBABLE 12-7 MM EX MISC
CUTANEOUS | Status: AC
  Filled 2019-05-16: qty 1

## 2019-05-16 NOTE — Discharge Instructions (Signed)

## 2019-05-16 NOTE — H&P (Addendum)
Chief Complaint: Patient was seen in consultation today for liver lesion biopsy at the request of William Ali  Referring Physician(s): Dorsey,William Ali  Supervising Physician: William Ali  Patient Status: William Ali  History of Present Illness: William Ali is a 34 y.o. 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 He is referred for biopsy of liver lesions. PMHx, meds, labs, imaging, allergies reviewed. Has been on abx for sinusitis and cough when he was seen in the ER a few weeks ago, though the pt terms his cough as 'pneumonia', I cannot find any notes where a provider diagnosed him with PNA. His initial CXR on 4/10 found an opacity on the lung and was read as suspicious for PNA, but his workup since that time finds this to be consistent with neoplastic process. He otherwise has no fevers, chills. Has been NPO today as directed.   Past Medical History:  Diagnosis Date  . Back pain   . Hypertension     Past Surgical History:  Procedure Laterality Date  . KNEE SURGERY      Allergies: Onion, Other, Peanut oil, and Peanut-containing drug products  Medications: Prior to Admission medications   Medication Sig Start Date End Date Taking? Authorizing Provider  azithromycin (ZITHROMAX Z-PAK) 250 MG tablet 2 po day one, then 1 daily x 4 days Patient not taking: Reported on 05/06/2019 04/23/19   Veryl Speak, MD  benzonatate (TESSALON) 100 MG capsule Take 1 capsule (100 mg total) by mouth every 8 (eight) hours. 05/05/19   Darr, Marguerita Beards, PA-C  butalbital-acetaminophen-caffeine (FIORICET) [phone] MG tablet Take 1 tablet by mouth every 6 (six) hours as needed for headache. Patient not taking: Reported on 05/06/2019 05/28/18 05/28/19  Scot Jun, FNP  cetirizine (ZYRTEC) 10 MG tablet Take 1 tablet (10 mg total) by mouth daily. Patient not taking: Reported on 05/06/2019 11/23/18   Augusto Gamble B, NP  cyclobenzaprine (FLEXERIL) 10 MG tablet Take 1  tablet by mouth 3 times daily as needed for muscle spasm. Warning: May cause drowsiness. Patient not taking: Reported on 05/06/2019 02/07/19   Vanessa Kick, MD  dexamethasone (DECADRON) 2 MG tablet Take 2 tablets (4 mg total) by mouth daily. 05/12/19   Orson Slick, MD  fluticasone (FLONASE) 50 MCG/ACT nasal spray Place 1 spray into both nostrils daily. Patient not taking: Reported on 05/06/2019 11/23/18   Augusto Gamble B, NP  hydrochlorothiazide (HYDRODIURIL) 25 MG tablet Take 1 tablet (25 mg total) by mouth daily. 07/21/18   Lamptey, Myrene Galas, MD  HYDROcodone-acetaminophen (NORCO/VICODIN) 5-325 MG tablet Take 1 tablet by mouth every 4 (four) hours as needed for up to 10 doses. 05/10/19   Corena Herter, PA-C  ibuprofen (ADVIL) 800 MG tablet Take 1 tablet (800 mg total) by mouth 3 (three) times daily with meals. Patient not taking: Reported on 05/06/2019 02/07/19   Vanessa Kick, MD  meloxicam (MOBIC) 15 MG tablet Take 1 tablet (15 mg total) by mouth daily. Patient not taking: Reported on 05/06/2019 02/17/19   Recardo Evangelist, PA-C  pantoprazole (PROTONIX) 40 MG tablet Take 40 mg by mouth daily. 04/11/19   [provider]  sodium chloride (OCEAN) 0.65 % SOLN nasal spray Place 1 spray into both nostrils as needed for congestion. 05/05/19   Darr, Marguerita Beards, PA-C  traMADol (ULTRAM) 50 MG tablet Take 1 tablet (50 mg total) by mouth every 6 (six) hours as needed. 05/06/19   Milton Ferguson, MD  amLODipine (  NORVASC) 5 MG tablet Take 1 tablet (5 mg total) by mouth daily. 04/21/18 07/21/18  Robyn Haber, MD  famotidine (PEPCID) 20 MG tablet Take 1 tablet (20 mg total) by mouth 2 (two) times daily. Patient taking differently: Take 20 mg by mouth 2 (two) times daily as needed for heartburn.  04/21/18 07/21/18  Robyn Haber, MD     Family History  Problem Relation Age of Onset  . Hypertension Mother   . Diabetes Father     Social History   Socioeconomic History  . Marital status: Married     Spouse name: Not on file  . Number of children: Not on file  . Years of education: Not on file  . Highest education level: Not on file  Occupational History  . Not on file  Tobacco Use  . Smoking status: Never Smoker  . Smokeless tobacco: Never Used  Substance and Sexual Activity  . Alcohol use: No  . Drug use: No  . Sexual activity: Yes  Other Topics Concern  . Not on file  Social History Narrative  . Not on file   Social Determinants of Health   Financial Resource Strain:   . Difficulty of Paying Living Expenses:   Food Insecurity:   . Worried About Charity fundraiser in the Last Year:   . Arboriculturist in the Last Year:   Transportation Needs:   . Film/video editor (Medical):   Marland Kitchen Lack of Transportation (Non-Medical):   Physical Activity:   . Days of Exercise per Week:   . Minutes of Exercise per Session:   Stress:   . Feeling of Stress :   Social Connections:   . Frequency of Communication with Friends and Family:   . Frequency of Social Gatherings with Friends and Family:   . Attends Religious Services:   . Active Member of Clubs or Organizations:   . Attends Archivist Meetings:   Marland Kitchen Marital Status:      Review of Systems: A 12 point ROS discussed and pertinent positives are indicated in the HPI above.  All other systems are negative.  Review of Systems  Vital Signs: BP (!) 148/97   Pulse (!) 109   Temp (!) 97.5 F (36.4 C) (Skin)   Resp 18   Ht 5\' 7"  (1.702 m)   Wt 104.3 kg   SpO2 100%   BMI 36.01 kg/m   Physical Exam Constitutional:      Appearance: Normal appearance.  HENT:     Mouth/Throat:     Mouth: Mucous membranes are moist.     Pharynx: Oropharynx is clear.  Cardiovascular:     Rate and Rhythm: Normal rate and regular rhythm.     Heart sounds: Normal heart sounds.  Pulmonary:     Effort: Pulmonary effort is normal. No respiratory distress.     Breath sounds: Normal breath sounds.  Abdominal:     General: Abdomen  is flat. There is no distension.     Palpations: Abdomen is soft. There is no mass.     Tenderness: There is no abdominal tenderness.  Skin:    General: Skin is warm and dry.  Neurological:     General: No focal deficit present.     Mental Status: He is alert and oriented to person, place, and time.  Psychiatric:        Mood and Affect: Mood normal.        Thought Content: Thought content normal.  Judgment: Judgment normal.       Imaging: DG Chest 2 View  Result Date: 05/06/2019 CLINICAL DATA:  Chest pain EXAM: CHEST - 2 VIEW COMPARISON:  05/01/2018 FINDINGS: Small opacity of the right upper lobe. Lungs are otherwise clear. No pleural effusion or pneumothorax. Cardiomediastinal contours are within normal limits. There is no acute osseous abnormality. IMPRESSION: Small right upper lobe opacity suspicious for pneumonia. Recommend repeat radiograph post treatment. Electronically Signed   By: Macy Mis M.D.   On: 05/06/2019 11:56   DG Lumbar Spine Complete  Result Date: 05/06/2019 CLINICAL DATA:  Low back pain radiating into left extremity EXAM: LUMBAR SPINE - COMPLETE 4+ VIEW COMPARISON:  2012 FINDINGS: Stable vertebral body heights. Stable to slightly increased retrolisthesis at L2-L3, L3-L4, and L4-L5. Multilevel facet hypertrophy has progressed. Mild disc space narrowing is stable to mildly progressed. Congenital narrowing of the spinal canal. No evidence of spondylolysis. 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 Electronically Signed   By: Macy Mis M.D.   On: 05/06/2019 12:43   DG Pelvis 1-2 Views  Result Date: 05/10/2019 CLINICAL DATA:  Acute left hip pain without known injury. EXAM: PELVIS - 1-2 VIEW COMPARISON:  None. FINDINGS: There is no evidence of pelvic fracture or diastasis. No pelvic bone lesions are seen. IMPRESSION: Negative. Electronically Signed   By: Marijo Conception M.D.   On: 05/10/2019  14:43   CT Chest W Contrast  Result Date: 05/10/2019 CLINICAL DATA:  Cancer of unknown primary, worsening headaches, neck and back pain with numerous intracranial lesions worrisome for metastatic disease EXAM: CT CHEST, ABDOMEN, AND PELVIS WITH CONTRAST TECHNIQUE: Multidetector CT imaging of the chest, abdomen and pelvis was performed following the standard protocol during bolus administration of intravenous contrast. CONTRAST:  138mL OMNIPAQUE IOHEXOL 300 MG/ML  SOLN COMPARISON:  Numerous comparison radiographs of the chest, most recently 05/06/2019 FINDINGS: CT CHEST FINDINGS Cardiovascular: The aortic root is suboptimally assessed given cardiac pulsation artifact. The aorta is normal caliber without acute luminal or periaortic abnormality. Normal 3 vessel branching of the aortic arch. Proximal great vessels opacified normally. Central pulmonary arteries are normal caliber. No large central filling defects are identified on this non tailored examination of the pulmonary arteries. Normal heart size. No pericardial effusion. Mediastinum/Nodes: Multiple right paratracheal and hilar lymph nodes are present with some low central attenuation which may suggest necrosis, representative nodes include a 12 mm low right paratracheal node (4R, 3/74), 10 mm low right precarinal node (4R, 2/21), and a a 19 mm right hilar lymph node (10R, 2/44). Lungs/Pleura: In the right lung apex is a 3.0 x 3.0 x 2.8 cm lesion with spiculated margins extending to both the pleural surface and superior right hilum. Additional subsided sub 5 mm nodules are seen in the left lung apex (6/40, 6/56), superior segment left lower lobe (6/59). No consolidation. No convincing features of edema. No pneumothorax or effusion. Musculoskeletal: There are numerous lytic appearing lesions throughout the thoracic spine involving both the vertebral bodies and posterior elements of multiple levels. Notably, a hypoattenuating lesion in the L3 vertebral body  demonstrates a superimposed pathologic compression deformity with approximately 30% height loss. No worrisome chest wall lesion. CT ABDOMEN PELVIS FINDINGS Hepatobiliary: There are innumerable peripherally enhancing centrally hypoattenuating lesions seen throughout the liver. The largest lesion is seen within the caudate lobe measuring up to 2.8 cm in size (2/47). An additional 2.4 cm lesion in the left lobe liver (2/52). Additional geographic region  of hyperattenuation in the periphery of the right upper lobe, may reflect a transient hepatic attenuation difference though certainly an underlying lesion in this location is not excluded. Gallbladder is unremarkable. No calcified gallstones. No biliary ductal dilatation. Pancreas: Unremarkable. No pancreatic ductal dilatation or surrounding inflammatory changes. Spleen: Normal in size without focal abnormality. Adrenals/Urinary Tract: Adrenal glands are unremarkable. Kidneys are normal, without renal calculi, focal lesion, or hydronephrosis. Bladder is unremarkable. High attenuation material within the urinary bladder likely related to recent contrast enhanced brain MRI. Stomach/Bowel: Distal esophagus, stomach and duodenal sweep are unremarkable. No small bowel wall thickening or dilatation. A normal appendix is visualized. No colonic dilatation or wall thickening. No evidence of obstruction. Vascular/Lymphatic: No significant vascular findings are present. No enlarged abdominal or pelvic lymph nodes. Reproductive: The prostate and seminal vesicles are unremarkable. Other: No abdominopelvic free fluid or air. Small fat containing inguinal hernias. No bowel containing hernia. Musculoskeletal: There are numerous lucent lesions within the vertebral bodies and transverse processes of the lumbar spine compatible with osseous metastatic disease. Additional lucent lesions present within both ilia including a lesion in the left iliac wing which demonstrates some soft tissue  extension (2/90). IMPRESSION: 1. Thick-walled cavitary lesion in the left lung apex with spiculated margins extending to both the pleural surface and superior right hilum, concerning for primary lung malignancy. Appearance could suggest a squamous cell carcinoma the lung given the location and cavitation though is by no means a diagnostic finding. 2. Multiple additional contralateral sub 5 mm lymph nodes, could be infectious or inflammatory though should be certainly viewed with suspicion and for metastatic disease given other findings throughout the body. 3. Numerous necrotic appearing is lateral mediastinal and right hilar lymph nodes, likely metastatic. 4. Innumerable rim enhancing hepatic lesions throughout both lobes of the liver compatible with solid organ metastasis. Additional geographic region of hyperattenuation in the periphery of the right upper lobe, may reflect a transient hepatic attenuation difference though certainly an underlying lesion in this location is not excluded. Attention on follow-up recommended. 5. There are multiple lytic appearing lesions throughout the thoracic and lumbar spine as well as portions of the bony pelvis. 6. Soft tissue extension of an osseous lesion involving the left iliac wing. 7. Pathologic split type compression fracture of the T3 vertebral body with approximately 30% height loss. These results were called by telephone at the time of interpretation on 05/10/2019 at 10:05 pm to provider GARRETT GREEN , who verbally acknowledged these results. Electronically Signed   By: Lovena Le M.D.   On: 05/10/2019 22:05   MR BRAIN W CONTRAST  Result Date: 05/10/2019 CLINICAL DATA:  Left retinal lesion, headaches EXAM: MRI HEAD AND ORBITS WITHOUT AND WITH CONTRAST TECHNIQUE: Multiplanar, multiecho pulse sequences of the brain and surrounding structures were obtained without and with intravenous contrast. Multiplanar, multiecho pulse sequences of the orbits and surrounding  structures were obtained including fat saturation techniques, before and after intravenous contrast administration. CONTRAST:  25mL GADAVIST GADOBUTROL 1 MMOL/ML Ali SOLN COMPARISON:  None. FINDINGS: Intravenous contrast was injected for MRI of the lumbar spine shortly before this study. Therefore, contrast remains present and all images are effectively postcontrast MRI HEAD FINDINGS Brain: There are multiple (greater than 30) subcentimeter parenchymal enhancing lesions including involvement of the cerebellum and right midbrain tectum. There is mild edema associated with some of the lesions. A minority also demonstrate corresponding susceptibility, which may reflect intralesional hemorrhage or mineralization. There is no acute infarction. No hydrocephalus. No significant mass  effect. Vascular: Major vessel flow voids at the skull base are preserved. Skull and upper cervical spine: Probable areas of abnormal marrow enhancement at the skull base and C2 posterior elements Other: Mastoid air cells are clear. MRI ORBITS FINDINGS Orbits: No proptosis. Suspected thin enhancing tissue at the posterior aspect of the left globe. Extraocular muscles are symmetric and unremarkable. There is no abnormal enhancement of the optic nerve sheath complexes. Visualized sinuses: Minor mucosal thickening. Soft tissues: Unremarkable. IMPRESSION: Numerous enhancing parenchymal intracranial lesions likely reflecting metastases. Mild associated edema. No mass effect. Probable areas of abnormal marrow enhancement reflecting osseous metastases at the skull base and upper cervical spine. Suspected abnormal thin enhancing tissue at the posterior aspect of the left lobe. Electronically Signed   By: Macy Mis M.D.   On: 05/10/2019 19:29   MR Lumbar Spine W Wo Contrast  Addendum Date: 05/10/2019   ADDENDUM REPORT: 05/10/2019 17:37 ADDENDUM: Postcontrast images were not reviewed initially. There is enhancement corresponding to STIR  hyperintense lesions. Additionally, there is extraosseous disease encroaching on the right S1 foramen. Electronically Signed   By: Macy Mis M.D.   On: 05/10/2019 17:37   Result Date: 05/10/2019 CLINICAL DATA:  Low back pain, left radiculopathy EXAM: MRI LUMBAR SPINE WITHOUT AND WITH CONTRAST TECHNIQUE: Multiplanar and multiecho pulse sequences of the lumbar spine were obtained without and with intravenous contrast. CONTRAST:  7mL GADAVIST GADOBUTROL 1 MMOL/ML Ali SOLN COMPARISON:  None. FINDINGS: Segmentation:  Standard. Alignment:  Trace retrolisthesis at L3-L4 Vertebrae: Small Schmorl's nodes are present. No compression deformity. There are STIR hyperintense lesions involving all levels as well as the bony pelvis. No evidence of significant epidural disease extension. Conus medullaris and cauda equina: Conus extends to the L1 level. Conus and cauda equina appear normal. Paraspinal and other soft tissues: Unremarkable. Disc levels: Imaged in the sagittal plane only, there is a small central disc protrusion and annular fissure at T11-T12. L1-L2: Mild facet hypertrophy and prominence of dorsal epidural fat. No significant canal or foraminal stenosis. L2-L3: Mild facet hypertrophy and prominence of dorsal epidural fat. No significant canal or foraminal stenosis. L3-L4: Left subarticular/foraminal disc protrusion. Mild facet arthropathy with ligamentum flavum infolding and mild prominence of dorsal epidural fat. No significant canal stenosis. Effacement of the left lateral recess with compression of the traversing L4 nerve root. No right foraminal stenosis. Mild left foraminal stenosis. L4-L5: Mild disc bulge. Mild facet arthropathy with ligamentum flavum infolding and mild prominence of dorsal epidural fat. No significant canal or foraminal stenosis. L5-S1: Mild facet arthropathy and prominence of epidural fat. No significant canal or foraminal stenosis. IMPRESSION: Multilevel, multifocal osseous lesions  suspicious for metastatic disease. No compression deformity or epidural disease. Disc protrusion at L3-L4 compressing the left L4 nerve root. Electronically Signed: By: Macy Mis M.D. On: 05/10/2019 16:38   CT ABDOMEN PELVIS W CONTRAST  Result Date: 05/10/2019 CLINICAL DATA:  Cancer of unknown primary, worsening headaches, neck and back pain with numerous intracranial lesions worrisome for metastatic disease EXAM: CT CHEST, ABDOMEN, AND PELVIS WITH CONTRAST TECHNIQUE: Multidetector CT imaging of the chest, abdomen and pelvis was performed following the standard protocol during bolus administration of intravenous contrast. CONTRAST:  135mL OMNIPAQUE IOHEXOL 300 MG/ML  SOLN COMPARISON:  Numerous comparison radiographs of the chest, most recently 05/06/2019 FINDINGS: CT CHEST FINDINGS Cardiovascular: The aortic root is suboptimally assessed given cardiac pulsation artifact. The aorta is normal caliber without acute luminal or periaortic abnormality. Normal 3 vessel branching of the aortic arch. Proximal  great vessels opacified normally. Central pulmonary arteries are normal caliber. No large central filling defects are identified on this non tailored examination of the pulmonary arteries. Normal heart size. No pericardial effusion. Mediastinum/Nodes: Multiple right paratracheal and hilar lymph nodes are present with some low central attenuation which may suggest necrosis, representative nodes include a 12 mm low right paratracheal node (4R, 3/74), 10 mm low right precarinal node (4R, 2/21), and a a 19 mm right hilar lymph node (10R, 2/44). Lungs/Pleura: In the right lung apex is a 3.0 x 3.0 x 2.8 cm lesion with spiculated margins extending to both the pleural surface and superior right hilum. Additional subsided sub 5 mm nodules are seen in the left lung apex (6/40, 6/56), superior segment left lower lobe (6/59). No consolidation. No convincing features of edema. No pneumothorax or effusion. Musculoskeletal:  There are numerous lytic appearing lesions throughout the thoracic spine involving both the vertebral bodies and posterior elements of multiple levels. Notably, a hypoattenuating lesion in the L3 vertebral body demonstrates a superimposed pathologic compression deformity with approximately 30% height loss. No worrisome chest wall lesion. CT ABDOMEN PELVIS FINDINGS Hepatobiliary: There are innumerable peripherally enhancing centrally hypoattenuating lesions seen throughout the liver. The largest lesion is seen within the caudate lobe measuring up to 2.8 cm in size (2/47). An additional 2.4 cm lesion in the left lobe liver (2/52). Additional geographic region of hyperattenuation in the periphery of the right upper lobe, may reflect a transient hepatic attenuation difference though certainly an underlying lesion in this location is not excluded. Gallbladder is unremarkable. No calcified gallstones. No biliary ductal dilatation. Pancreas: Unremarkable. No pancreatic ductal dilatation or surrounding inflammatory changes. Spleen: Normal in size without focal abnormality. Adrenals/Urinary Tract: Adrenal glands are unremarkable. Kidneys are normal, without renal calculi, focal lesion, or hydronephrosis. Bladder is unremarkable. High attenuation material within the urinary bladder likely related to recent contrast enhanced brain MRI. Stomach/Bowel: Distal esophagus, stomach and duodenal sweep are unremarkable. No small bowel wall thickening or dilatation. A normal appendix is visualized. No colonic dilatation or wall thickening. No evidence of obstruction. Vascular/Lymphatic: No significant vascular findings are present. No enlarged abdominal or pelvic lymph nodes. Reproductive: The prostate and seminal vesicles are unremarkable. Other: No abdominopelvic free fluid or air. Small fat containing inguinal hernias. No bowel containing hernia. Musculoskeletal: There are numerous lucent lesions within the vertebral bodies and  transverse processes of the lumbar spine compatible with osseous metastatic disease. Additional lucent lesions present within both ilia including a lesion in the left iliac wing which demonstrates some soft tissue extension (2/90). IMPRESSION: 1. Thick-walled cavitary lesion in the left lung apex with spiculated margins extending to both the pleural surface and superior right hilum, concerning for primary lung malignancy. Appearance could suggest a squamous cell carcinoma the lung given the location and cavitation though is by no means a diagnostic finding. 2. Multiple additional contralateral sub 5 mm lymph nodes, could be infectious or inflammatory though should be certainly viewed with suspicion and for metastatic disease given other findings throughout the body. 3. Numerous necrotic appearing is lateral mediastinal and right hilar lymph nodes, likely metastatic. 4. Innumerable rim enhancing hepatic lesions throughout both lobes of the liver compatible with solid organ metastasis. Additional geographic region of hyperattenuation in the periphery of the right upper lobe, may reflect a transient hepatic attenuation difference though certainly an underlying lesion in this location is not excluded. Attention on follow-up recommended. 5. There are multiple lytic appearing lesions throughout the thoracic and lumbar  spine as well as portions of the bony pelvis. 6. Soft tissue extension of an osseous lesion involving the left iliac wing. 7. Pathologic split type compression fracture of the T3 vertebral body with approximately 30% height loss. These results were called by telephone at the time of interpretation on 05/10/2019 at 10:05 pm to provider GARRETT GREEN , who verbally acknowledged these results. Electronically Signed   By: Lovena Le M.D.   On: 05/10/2019 22:05   MR ORBITS W CONTRAST  Result Date: 05/10/2019 CLINICAL DATA:  Left retinal lesion, headaches EXAM: MRI HEAD AND ORBITS WITHOUT AND WITH CONTRAST  TECHNIQUE: Multiplanar, multiecho pulse sequences of the brain and surrounding structures were obtained without and with intravenous contrast. Multiplanar, multiecho pulse sequences of the orbits and surrounding structures were obtained including fat saturation techniques, before and after intravenous contrast administration. CONTRAST:  75mL GADAVIST GADOBUTROL 1 MMOL/ML Ali SOLN COMPARISON:  None. FINDINGS: Intravenous contrast was injected for MRI of the lumbar spine shortly before this study. Therefore, contrast remains present and all images are effectively postcontrast MRI HEAD FINDINGS Brain: There are multiple (greater than 30) subcentimeter parenchymal enhancing lesions including involvement of the cerebellum and right midbrain tectum. There is mild edema associated with some of the lesions. A minority also demonstrate corresponding susceptibility, which may reflect intralesional hemorrhage or mineralization. There is no acute infarction. No hydrocephalus. No significant mass effect. Vascular: Major vessel flow voids at the skull base are preserved. Skull and upper cervical spine: Probable areas of abnormal marrow enhancement at the skull base and C2 posterior elements Other: Mastoid air cells are clear. MRI ORBITS FINDINGS Orbits: No proptosis. Suspected thin enhancing tissue at the posterior aspect of the left globe. Extraocular muscles are symmetric and unremarkable. There is no abnormal enhancement of the optic nerve sheath complexes. Visualized sinuses: Minor mucosal thickening. Soft tissues: Unremarkable. IMPRESSION: Numerous enhancing parenchymal intracranial lesions likely reflecting metastases. Mild associated edema. No mass effect. Probable areas of abnormal marrow enhancement reflecting osseous metastases at the skull base and upper cervical spine. Suspected abnormal thin enhancing tissue at the posterior aspect of the left lobe. Electronically Signed   By: Macy Mis M.D.   On: 05/10/2019  19:29    Labs:  CBC: Recent Labs    05/10/19 1400  WBC 11.9*  HGB 13.1  HCT 44.5  PLT 325    COAGS: No results for input(s): INR, APTT in the last 8760 hours.  BMP: Recent Labs    05/10/19 1400  NA 139  K 4.5  CL 100  CO2 27  GLUCOSE 99  BUN 15  CALCIUM 9.8  CREATININE 1.37*  GFRNONAA >60  GFRAA >60    LIVER FUNCTION TESTS: Recent Labs    05/10/19 1400  BILITOT 1.0  AST 51*  ALT 66*  ALKPHOS 261*  PROT 8.5*  ALBUMIN 4.3    TUMOR MARKERS: No results for input(s): AFPTM, CEA, CA199, CHROMGRNA in the last 8760 hours.  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 Labs pending Risks and benefits of liver biopsy was discussed with the patient and/or patient's family including, but not limited to bleeding, infection, damage to adjacent structures or low yield requiring additional tests.  All of the questions were answered and there is agreement to proceed.  Consent signed and in chart.    Thank you for this interesting consult.  I greatly enjoyed meeting Kortez Isidoro Donning and look forward to participating  in their care.  A copy of this report was sent to the requesting provider on this date.  Electronically Signed: Ascencion Dike, PA-C 05/16/2019, 11:59 AM   I spent a total of 20 minutes in face to face in clinical consultation, greater than 50% of which was counseling/coordinating care for liver biopsy

## 2019-05-16 NOTE — Sedation Documentation (Signed)
Waiting in [place] for discharge orders in order to transfer patient to Short Stay. Dr Jarvis Newcomer aware

## 2019-05-18 LAB — SURGICAL PATHOLOGY

## 2019-05-19 ENCOUNTER — Inpatient Hospital Stay: Payer: Self-pay

## 2019-05-19 ENCOUNTER — Telehealth: Payer: Self-pay | Admitting: Internal Medicine

## 2019-05-19 ENCOUNTER — Other Ambulatory Visit: Payer: Self-pay | Admitting: Radiation Therapy

## 2019-05-19 ENCOUNTER — Inpatient Hospital Stay: Payer: Self-pay | Attending: Hematology and Oncology | Admitting: Hematology and Oncology

## 2019-05-19 ENCOUNTER — Other Ambulatory Visit: Payer: Self-pay

## 2019-05-19 VITALS — BP 145/95 | HR 110 | Temp 98.2°F | Ht 67.0 in | Wt 208.6 lb

## 2019-05-19 DIAGNOSIS — C3491 Malignant neoplasm of unspecified part of right bronchus or lung: Secondary | ICD-10-CM | POA: Insufficient documentation

## 2019-05-19 DIAGNOSIS — G893 Neoplasm related pain (acute) (chronic): Secondary | ICD-10-CM | POA: Insufficient documentation

## 2019-05-19 DIAGNOSIS — C7931 Secondary malignant neoplasm of brain: Secondary | ICD-10-CM | POA: Insufficient documentation

## 2019-05-19 DIAGNOSIS — Z79899 Other long term (current) drug therapy: Secondary | ICD-10-CM | POA: Insufficient documentation

## 2019-05-19 DIAGNOSIS — C7951 Secondary malignant neoplasm of bone: Secondary | ICD-10-CM | POA: Insufficient documentation

## 2019-05-19 DIAGNOSIS — Z7189 Other specified counseling: Secondary | ICD-10-CM

## 2019-05-19 DIAGNOSIS — C787 Secondary malignant neoplasm of liver and intrahepatic bile duct: Secondary | ICD-10-CM | POA: Insufficient documentation

## 2019-05-19 DIAGNOSIS — R634 Abnormal weight loss: Secondary | ICD-10-CM | POA: Insufficient documentation

## 2019-05-19 MED ORDER — DEXAMETHASONE 4 MG PO TABS: 8.0000 mg | ORAL_TABLET | Freq: Every day | ORAL | 2 refills | Status: DC

## 2019-05-19 MED ORDER — ALBUTEROL SULFATE HFA 108 (90 BASE) MCG/ACT IN AERS: 2.0000 | INHALATION_SPRAY | Freq: Four times a day (QID) | RESPIRATORY_TRACT | 1 refills | Status: DC | PRN

## 2019-05-19 MED ORDER — OXYCODONE HCL 5 MG PO TABS: 5.0000 mg | ORAL_TABLET | Freq: Four times a day (QID) | ORAL | 0 refills | Status: DC | PRN

## 2019-05-19 NOTE — Progress Notes (Signed)
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 Short term f/u of this lesion was recommended. CXR on 05/01/2018 performed with recommended f/u imaging. 2) 05/10/2019: presented to the ED with headache and left pelvis pain. Patient underwent CT C/A/P and MRI brain. Brain MRI showed numerous enhancing parenchymal intracranial lesions likely reflecting metastases. CT scan showed cavitary lesion in the right lung apex with spiculated margins. Additional sites of disease include lymph nodes of the chest and numerous liver mets. In the bones there were multiple lytic appearing lesions throughout the thoracic and lumbar spine as well as portions of the bony pelvis. Referred to Oncology. 3) 05/16/2019: US biopsy of the liver performed. Findings consistent with a poorly differentiated adenocarcinoma.  4) 05/19/2019: establish care with Dr. Lorenso Courier   CHIEF COMPLAINTS/PURPOSE OF CONSULTATION:  "Metastatic Lung Cancer with Brain and Liver Metastasis "  HISTORY OF PRESENTING ILLNESS:  William Ali 34 y.o. male with medical history significant for hypertension who presents to establish care for a new diagnosis of metastatic lung cancer.   On review of the previous records William Ali initially presented to the emergency department on 05/10/2019 with complaints of headache and left pelvis pain.  Of note he had previously presented to the ED numerous times over the last year including 05/05/2019, 05/06/2019, 04/03/2019, and 03/22/2019.  On 05/10/2019 emergency department underwent extensive work-up with the patient including CT chest abdomen pelvis as well as MRI brain.  Unfortunately the MRI brain showed numerous enhancing  lesions consistent with metastatic disease.  CT scan showed a cavitary lesion in the right lung apex.  Additional sites of disease included lymph nodes in the chest, numerous liver mets, and extensive osseous mets including the hips as well as a T3 vertebral body fracture.  Due to concern for metastatic disease the patient was referred to oncology for further evaluation management.  Upon hearing of this consult a ultrasound-guided biopsy of the liver was recommended and performed on 05/16/2019.  Initial findings were consistent with a poorly differentiated adenocarcinoma.  On exam today William Ali appears quite unwell.  He has been having issues with fatigue, cough, and headache nearly every day for the last 3 months.  He is consistently covering his left eye as he notes he has a large blurry spot within his vision.  He attempted wearing an eye patch, but notes that this put too much pressure on his eye.  He is currently following with ophthalmology at [facility] who noted there was "fluid buildup "in the back of the eye.  Imaging on MRI is concerning for thickening in the posterior portion of the globe as well as metastatic disease to the occipital lobe.  The patient reports that his headaches has been predominantly in a bandlike fashion across the front of the head with some pain in the back of the head as well.  He was started on steroid therapy is 4 mg p.o. daily, but he notes that this has not been helping much with his headaches.  He has been taking Vicodin as well which has provided some more relief.  The patient reports that his cough is nonproductive and that he has not been suffering from any issues with shortness of breath.  He has  had considerable weight loss and reports 30 pounds decrease within the last several weeks.  He notes that his appetite is fine and that he has been eating well.  He also endorses osseous pain in his back as well as his hips.  He has been having some episodes of  chills, but denies having any nausea, vomiting, or diarrhea.  A full 10 point ROS is listed below.  MEDICAL HISTORY:  Past Medical History:  Diagnosis Date  . Back pain   . Hypertension     SURGICAL HISTORY: Past Surgical History:  Procedure Laterality Date  . KNEE SURGERY      SOCIAL HISTORY: Social History   Socioeconomic History  . Marital status: Married    Spouse name: Not on file  . Number of children: Not on file  . Years of education: Not on file  . Highest education level: Not on file  Occupational History  . Not on file  Tobacco Use  . Smoking status: Never Smoker  . Smokeless tobacco: Never Used  Substance and Sexual Activity  . Alcohol use: No  . Drug use: No  . Sexual activity: Yes  Other Topics Concern  . Not on file  Social History Narrative  . Not on file   Social Determinants of Health   Financial Resource Strain:   . Difficulty of Paying Living Expenses:   Food Insecurity:   . Worried About Charity fundraiser in the Last Year:   . Arboriculturist in the Last Year:   Transportation Needs:   . Film/video editor (Medical):   Marland Kitchen Lack of Transportation (Non-Medical):   Physical Activity:   . Days of Exercise per Week:   . Minutes of Exercise per Session:   Stress:   . Feeling of Stress :   Social Connections:   . Frequency of Communication with Friends and Family:   . Frequency of Social Gatherings with Friends and Family:   . Attends Religious Services:   . Active Member of Clubs or Organizations:   . Attends Archivist Meetings:   Marland Kitchen Marital Status:   Intimate Partner Violence:   . Fear of Current or Ex-Partner:   . Emotionally Abused:   Marland Kitchen Physically Abused:   . Sexually Abused:     FAMILY HISTORY: Family History  Problem Relation Age of Onset  . Hypertension Mother   . Diabetes Father     ALLERGIES:  is allergic to onion; other; peanut oil; and peanut-containing drug products.  MEDICATIONS:  Current Outpatient  Medications  Medication Sig Dispense Refill  . benzonatate (TESSALON) 100 MG capsule Take 1 capsule (100 mg total) by mouth every 8 (eight) hours. 21 capsule 0  . cetirizine (ZYRTEC) 10 MG tablet Take 1 tablet (10 mg total) by mouth daily. 30 tablet 0  . dexamethasone (DECADRON) 2 MG tablet Take 2 tablets (4 mg total) by mouth daily. 30 tablet 1  . hydrochlorothiazide (HYDRODIURIL) 25 MG tablet Take 1 tablet (25 mg total) by mouth daily. 30 tablet 1  . HYDROcodone-acetaminophen (NORCO/VICODIN) 5-325 MG tablet Take 1 tablet by mouth every 4 (four) hours as needed for up to 10 doses. 10 tablet 0  . pantoprazole (PROTONIX) 40 MG tablet Take 40 mg by mouth daily.    . sodium chloride (OCEAN) 0.65 % SOLN nasal spray Place 1 spray into both nostrils as needed for congestion. 44 mL 0  . traMADol (ULTRAM) 50 MG tablet Take 1 tablet (50 mg total)  by mouth every 6 (six) hours as needed. 15 tablet 0   No current facility-administered medications for this visit.    REVIEW OF SYSTEMS:   Constitutional: ( - ) fevers, ( + )  chills , ( - ) night sweats (+) weight loss Eyes: ( + ) blurriness of vision, ( - ) double vision, ( - ) watery eyes Ears, nose, mouth, throat, and face: ( - ) mucositis, ( - ) sore throat Respiratory: ( + ) cough, ( + ) dyspnea, ( - ) wheezes Cardiovascular: ( - ) palpitation, ( - ) chest discomfort, ( - ) lower extremity swelling Gastrointestinal:  ( - ) nausea, ( - ) heartburn, ( - ) change in bowel habits Skin: ( - ) abnormal skin rashes Lymphatics: ( - ) new lymphadenopathy, ( - ) easy bruising Neurological: ( - ) numbness, ( - ) tingling, ( + ) new weaknesses Behavioral/Psych: ( - ) mood change, ( - ) new changes  All other systems were reviewed with the patient and are negative.  PHYSICAL EXAMINATION: ECOG PERFORMANCE STATUS: 1 - Symptomatic but completely ambulatory  Vitals:   05/19/19 1120  BP: (!) 145/95  Pulse: (!) 110  Temp: 98.2 F (36.8 C)  SpO2: 100%   Filed  Weights   05/19/19 1120  Weight: 208 lb 9.6 oz (94.6 kg)    GENERAL: well appearing young African American male in NAD  SKIN: skin color, texture, turgor are normal, no rashes or significant lesions EYES: conjunctiva are pink and non-injected, sclera clear LUNGS: clear to auscultation and percussion with normal breathing effort. Some coarse crackles in right lung.  HEART: regular rate & rhythm and no murmurs and no lower extremity edema ABDOMEN: soft, non-tender, non-distended, normal bowel sounds. No HSM.  Musculoskeletal: no cyanosis of digits and no clubbing  PSYCH: alert & oriented x 3, fluent speech NEURO: no clear focal motor/sensory deficits, though patient reports left leg weakness. Visual fields not assessed   LABORATORY DATA:  I have reviewed the data as listed CBC Latest Ref Rng & Units 05/16/2019 05/10/2019 05/01/2018  WBC 4.0 - 10.5 K/uL 13.9(H) 11.9(H) 11.2(H)  Hemoglobin 13.0 - 17.0 g/dL 14.2 13.1 13.9  Hematocrit 39.0 - 52.0 % 44.2 44.5 44.8  Platelets 150 - 400 K/uL 393 325 410(H)    CMP Latest Ref Rng & Units 05/10/2019 05/01/2018 04/24/2018  Glucose 70 - 99 mg/dL 99 94 86  BUN 6 - 20 mg/dL 15 16 22(H)  Creatinine 0.61 - 1.24 mg/dL 1.37(H) 1.42(H) 1.44(H)  Sodium 135 - 145 mmol/L 139 139 137  Potassium 3.5 - 5.1 mmol/L 4.5 3.5 4.9  Chloride 98 - 111 mmol/L 100 104 105  CO2 22 - 32 mmol/L 27 27 21(L)  Calcium 8.9 - 10.3 mg/dL 9.8 9.7 9.2  Total Protein 6.5 - 8.1 g/dL 8.5(H) - -  Total Bilirubin 0.3 - 1.2 mg/dL 1.0 - -  Alkaline Phos 38 - 126 U/L 261(H) - -  AST 15 - 41 U/L 51(H) - -  ALT 0 - 44 U/L 66(H) - -     PATHOLOGY: SURGICAL PATHOLOGY  CASE: [id number]  PATIENT: Pamella Pert  Surgical Pathology Report      Clinical History: malignant neoplasm metastatic to intrathoracic lymph  node (HCC) (cm)      FINAL MICROSCOPIC DIAGNOSIS:   A. LIVER, NEEDLE CORE BIOPSY:  - Poorly differentiated adenocarcinoma.  - See comment.   COMMENT:   The  malignant cells are positive for cytokeratin 7.  They are negative  for CDX2, cytokeratin 20, hepar 1, arginase, glypican, and CD34. A  reticulin stain reveals a reticulin framework. 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 Dr. Vic Ripper has reviewed  the case and concurs with this interpretation. There is sufficient  tumor present for additional studies, if requested.    GROSS DESCRIPTION:   Received in formalin are 3 cores of tan-pink soft tissue ranging from  1.5 to 1.8 cm, submitted in 2 cassettes. (AK 05/16/2019)     Final Diagnosis performed by Enid Cutter, MD.  Electronically signed  05/18/2019  Technical and / or Professional components performed at [facility]. [facility], [place] [address] [place], [place] [address].  Immunohistochemistry Technical component (if applicable) was performed  at [facility]. [address], [place],  [place], [place] [address].  IMMUNOHISTOCHEMISTRY DISCLAIMER (if applicable):  Some of these immunohistochemical stains may have been developed and the  performance characteristics determine by [facility]. Some  may not have been cleared or approved by the U.S. Food and Drug  Administration. The FDA has determined that such clearance or approval  is not necessary. This test is used for clinical purposes. It should not  be regarded as investigational or for research. This laboratory is  certified under the [place]  (CLIA-88) as qualified to perform high complexity clinical laboratory  testing. The controls stained appropriately.       Marland Kitchen  RADIOGRAPHIC STUDIES: I have personally reviewed the radiological images as listed and agreed with the findings in the report: widely metastatic disease. Numerous lesions in the brain on MRI. Numerous lesions in the liver and osseus structures.  Large right apex lung mass, cavitary.   DG Chest 2 View  Result Date: 05/06/2019 CLINICAL DATA:  Chest pain EXAM: CHEST - 2 VIEW COMPARISON:  05/01/2018 FINDINGS: Small opacity of the right upper lobe. Lungs are otherwise clear. No pleural effusion or pneumothorax. Cardiomediastinal contours are within normal limits. There is no acute osseous abnormality. IMPRESSION: Small right upper lobe opacity suspicious for pneumonia. Recommend repeat radiograph post treatment. Electronically Signed   By: Macy Mis M.D.   On: 05/06/2019 11:56   DG Lumbar Spine Complete  Result Date: 05/06/2019 CLINICAL DATA:  Low back pain radiating into left extremity EXAM: LUMBAR SPINE - COMPLETE 4+ VIEW COMPARISON:  2012 FINDINGS: Stable vertebral body heights. Stable to slightly increased retrolisthesis at L2-L3, L3-L4, and L4-L5. Multilevel facet hypertrophy has progressed. Mild disc space narrowing is stable to mildly progressed. Congenital narrowing of the spinal canal. No evidence of spondylolysis. 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 Electronically Signed   By: Macy Mis M.D.   On: 05/06/2019 12:43   DG Pelvis 1-2 Views  Result Date: 05/10/2019 CLINICAL DATA:  Acute left hip pain without known injury. EXAM: PELVIS - 1-2 VIEW COMPARISON:  None. FINDINGS: There is no evidence of pelvic fracture or diastasis. No pelvic bone lesions are seen. IMPRESSION: Negative. Electronically Signed   By: Marijo Conception M.D.   On: 05/10/2019 14:43   CT Chest W Contrast  Result Date: 05/10/2019 CLINICAL DATA:  Cancer of unknown primary, worsening headaches, neck and back pain with numerous intracranial lesions worrisome for metastatic disease EXAM: CT CHEST, ABDOMEN, AND PELVIS WITH CONTRAST TECHNIQUE: Multidetector CT imaging of the chest, abdomen and pelvis was performed following the standard protocol during bolus  administration of intravenous contrast.  CONTRAST:  15m OMNIPAQUE IOHEXOL 300 MG/ML  SOLN COMPARISON:  Numerous comparison radiographs of the chest, most recently 05/06/2019 FINDINGS: CT CHEST FINDINGS Cardiovascular: The aortic root is suboptimally assessed given cardiac pulsation artifact. The aorta is normal caliber without acute luminal or periaortic abnormality. Normal 3 vessel branching of the aortic arch. Proximal great vessels opacified normally. Central pulmonary arteries are normal caliber. No large central filling defects are identified on this non tailored examination of the pulmonary arteries. Normal heart size. No pericardial effusion. Mediastinum/Nodes: Multiple right paratracheal and hilar lymph nodes are present with some low central attenuation which may suggest necrosis, representative nodes include a 12 mm low right paratracheal node (4R, 3/74), 10 mm low right precarinal node (4R, 2/21), and a a 19 mm right hilar lymph node (10R, 2/44). Lungs/Pleura: In the right lung apex is a 3.0 x 3.0 x 2.8 cm lesion with spiculated margins extending to both the pleural surface and superior right hilum. Additional subsided sub 5 mm nodules are seen in the left lung apex (6/40, 6/56), superior segment left lower lobe (6/59). No consolidation. No convincing features of edema. No pneumothorax or effusion. Musculoskeletal: There are numerous lytic appearing lesions throughout the thoracic spine involving both the vertebral bodies and posterior elements of multiple levels. Notably, a hypoattenuating lesion in the L3 vertebral body demonstrates a superimposed pathologic compression deformity with approximately 30% height loss. No worrisome chest wall lesion. CT ABDOMEN PELVIS FINDINGS Hepatobiliary: There are innumerable peripherally enhancing centrally hypoattenuating lesions seen throughout the liver. The largest lesion is seen within the caudate lobe measuring up to 2.8 cm in size (2/47). An additional 2.4 cm lesion in the left lobe liver (2/52).  Additional geographic region of hyperattenuation in the periphery of the right upper lobe, may reflect a transient hepatic attenuation difference though certainly an underlying lesion in this location is not excluded. Gallbladder is unremarkable. No calcified gallstones. No biliary ductal dilatation. Pancreas: Unremarkable. No pancreatic ductal dilatation or surrounding inflammatory changes. Spleen: Normal in size without focal abnormality. Adrenals/Urinary Tract: Adrenal glands are unremarkable. Kidneys are normal, without renal calculi, focal lesion, or hydronephrosis. Bladder is unremarkable. High attenuation material within the urinary bladder likely related to recent contrast enhanced brain MRI. Stomach/Bowel: Distal esophagus, stomach and duodenal sweep are unremarkable. No small bowel wall thickening or dilatation. A normal appendix is visualized. No colonic dilatation or wall thickening. No evidence of obstruction. Vascular/Lymphatic: No significant vascular findings are present. No enlarged abdominal or pelvic lymph nodes. Reproductive: The prostate and seminal vesicles are unremarkable. Other: No abdominopelvic free fluid or air. Small fat containing inguinal hernias. No bowel containing hernia. Musculoskeletal: There are numerous lucent lesions within the vertebral bodies and transverse processes of the lumbar spine compatible with osseous metastatic disease. Additional lucent lesions present within both ilia including a lesion in the left iliac wing which demonstrates some soft tissue extension (2/90). IMPRESSION: 1. Thick-walled cavitary lesion in the left lung apex with spiculated margins extending to both the pleural surface and superior right hilum, concerning for primary lung malignancy. Appearance could suggest a squamous cell carcinoma the lung given the location and cavitation though is by no means a diagnostic finding. 2. Multiple additional contralateral sub 5 mm lymph nodes, could be  infectious or inflammatory though should be certainly viewed with suspicion and for metastatic disease given other findings throughout the body. 3. Numerous necrotic appearing is lateral mediastinal and right hilar lymph nodes, likely metastatic. 4. Innumerable rim enhancing  hepatic lesions throughout both lobes of the liver compatible with solid organ metastasis. Additional geographic region of hyperattenuation in the periphery of the right upper lobe, may reflect a transient hepatic attenuation difference though certainly an underlying lesion in this location is not excluded. Attention on follow-up recommended. 5. There are multiple lytic appearing lesions throughout the thoracic and lumbar spine as well as portions of the bony pelvis. 6. Soft tissue extension of an osseous lesion involving the left iliac wing. 7. Pathologic split type compression fracture of the T3 vertebral body with approximately 30% height loss. These results were called by telephone at the time of interpretation on 05/10/2019 at 10:05 pm to provider GARRETT GREEN , who verbally acknowledged these results. Electronically Signed   By: Lovena Le M.D.   On: 05/10/2019 22:05   MR BRAIN W CONTRAST  Result Date: 05/10/2019 CLINICAL DATA:  Left retinal lesion, headaches EXAM: MRI HEAD AND ORBITS WITHOUT AND WITH CONTRAST TECHNIQUE: Multiplanar, multiecho pulse sequences of the brain and surrounding structures were obtained without and with intravenous contrast. Multiplanar, multiecho pulse sequences of the orbits and surrounding structures were obtained including fat saturation techniques, before and after intravenous contrast administration. CONTRAST:  33m GADAVIST GADOBUTROL 1 MMOL/ML IV SOLN COMPARISON:  None. FINDINGS: Intravenous contrast was injected for MRI of the lumbar spine shortly before this study. Therefore, contrast remains present and all images are effectively postcontrast MRI HEAD FINDINGS Brain: There are multiple (greater  than 30) subcentimeter parenchymal enhancing lesions including involvement of the cerebellum and right midbrain tectum. There is mild edema associated with some of the lesions. A minority also demonstrate corresponding susceptibility, which may reflect intralesional hemorrhage or mineralization. There is no acute infarction. No hydrocephalus. No significant mass effect. Vascular: Major vessel flow voids at the skull base are preserved. Skull and upper cervical spine: Probable areas of abnormal marrow enhancement at the skull base and C2 posterior elements Other: Mastoid air cells are clear. MRI ORBITS FINDINGS Orbits: No proptosis. Suspected thin enhancing tissue at the posterior aspect of the left globe. Extraocular muscles are symmetric and unremarkable. There is no abnormal enhancement of the optic nerve sheath complexes. Visualized sinuses: Minor mucosal thickening. Soft tissues: Unremarkable. IMPRESSION: Numerous enhancing parenchymal intracranial lesions likely reflecting metastases. Mild associated edema. No mass effect. Probable areas of abnormal marrow enhancement reflecting osseous metastases at the skull base and upper cervical spine. Suspected abnormal thin enhancing tissue at the posterior aspect of the left lobe. Electronically Signed   By: PMacy MisM.D.   On: 05/10/2019 19:29   MR Lumbar Spine W Wo Contrast  Addendum Date: 05/10/2019   ADDENDUM REPORT: 05/10/2019 17:37 ADDENDUM: Postcontrast images were not reviewed initially. There is enhancement corresponding to STIR hyperintense lesions. Additionally, there is extraosseous disease encroaching on the right S1 foramen. Electronically Signed   By: PMacy MisM.D.   On: 05/10/2019 17:37   Result Date: 05/10/2019 CLINICAL DATA:  Low back pain, left radiculopathy EXAM: MRI LUMBAR SPINE WITHOUT AND WITH CONTRAST TECHNIQUE: Multiplanar and multiecho pulse sequences of the lumbar spine were obtained without and with intravenous contrast.  CONTRAST:  126mGADAVIST GADOBUTROL 1 MMOL/ML IV SOLN COMPARISON:  None. FINDINGS: Segmentation:  Standard. Alignment:  Trace retrolisthesis at L3-L4 Vertebrae: Small Schmorl's nodes are present. No compression deformity. There are STIR hyperintense lesions involving all levels as well as the bony pelvis. No evidence of significant epidural disease extension. Conus medullaris and cauda equina: Conus extends to the L1 level. Conus and cauda  equina appear normal. Paraspinal and other soft tissues: Unremarkable. Disc levels: Imaged in the sagittal plane only, there is a small central disc protrusion and annular fissure at T11-T12. L1-L2: Mild facet hypertrophy and prominence of dorsal epidural fat. No significant canal or foraminal stenosis. L2-L3: Mild facet hypertrophy and prominence of dorsal epidural fat. No significant canal or foraminal stenosis. L3-L4: Left subarticular/foraminal disc protrusion. Mild facet arthropathy with ligamentum flavum infolding and mild prominence of dorsal epidural fat. No significant canal stenosis. Effacement of the left lateral recess with compression of the traversing L4 nerve root. No right foraminal stenosis. Mild left foraminal stenosis. L4-L5: Mild disc bulge. Mild facet arthropathy with ligamentum flavum infolding and mild prominence of dorsal epidural fat. No significant canal or foraminal stenosis. L5-S1: Mild facet arthropathy and prominence of epidural fat. No significant canal or foraminal stenosis. IMPRESSION: Multilevel, multifocal osseous lesions suspicious for metastatic disease. No compression deformity or epidural disease. Disc protrusion at L3-L4 compressing the left L4 nerve root. Electronically Signed: By: Macy Mis M.D. On: 05/10/2019 16:38   CT ABDOMEN PELVIS W CONTRAST  Result Date: 05/10/2019 CLINICAL DATA:  Cancer of unknown primary, worsening headaches, neck and back pain with numerous intracranial lesions worrisome for metastatic disease EXAM: CT  CHEST, ABDOMEN, AND PELVIS WITH CONTRAST TECHNIQUE: Multidetector CT imaging of the chest, abdomen and pelvis was performed following the standard protocol during bolus administration of intravenous contrast. CONTRAST:  132m OMNIPAQUE IOHEXOL 300 MG/ML  SOLN COMPARISON:  Numerous comparison radiographs of the chest, most recently 05/06/2019 FINDINGS: CT CHEST FINDINGS Cardiovascular: The aortic root is suboptimally assessed given cardiac pulsation artifact. The aorta is normal caliber without acute luminal or periaortic abnormality. Normal 3 vessel branching of the aortic arch. Proximal great vessels opacified normally. Central pulmonary arteries are normal caliber. No large central filling defects are identified on this non tailored examination of the pulmonary arteries. Normal heart size. No pericardial effusion. Mediastinum/Nodes: Multiple right paratracheal and hilar lymph nodes are present with some low central attenuation which may suggest necrosis, representative nodes include a 12 mm low right paratracheal node (4R, 3/74), 10 mm low right precarinal node (4R, 2/21), and a a 19 mm right hilar lymph node (10R, 2/44). Lungs/Pleura: In the right lung apex is a 3.0 x 3.0 x 2.8 cm lesion with spiculated margins extending to both the pleural surface and superior right hilum. Additional subsided sub 5 mm nodules are seen in the left lung apex (6/40, 6/56), superior segment left lower lobe (6/59). No consolidation. No convincing features of edema. No pneumothorax or effusion. Musculoskeletal: There are numerous lytic appearing lesions throughout the thoracic spine involving both the vertebral bodies and posterior elements of multiple levels. Notably, a hypoattenuating lesion in the L3 vertebral body demonstrates a superimposed pathologic compression deformity with approximately 30% height loss. No worrisome chest wall lesion. CT ABDOMEN PELVIS FINDINGS Hepatobiliary: There are innumerable peripherally enhancing  centrally hypoattenuating lesions seen throughout the liver. The largest lesion is seen within the caudate lobe measuring up to 2.8 cm in size (2/47). An additional 2.4 cm lesion in the left lobe liver (2/52). Additional geographic region of hyperattenuation in the periphery of the right upper lobe, may reflect a transient hepatic attenuation difference though certainly an underlying lesion in this location is not excluded. Gallbladder is unremarkable. No calcified gallstones. No biliary ductal dilatation. Pancreas: Unremarkable. No pancreatic ductal dilatation or surrounding inflammatory changes. Spleen: Normal in size without focal abnormality. Adrenals/Urinary Tract: Adrenal glands are unremarkable. Kidneys are normal,  without renal calculi, focal lesion, or hydronephrosis. Bladder is unremarkable. High attenuation material within the urinary bladder likely related to recent contrast enhanced brain MRI. Stomach/Bowel: Distal esophagus, stomach and duodenal sweep are unremarkable. No small bowel wall thickening or dilatation. A normal appendix is visualized. No colonic dilatation or wall thickening. No evidence of obstruction. Vascular/Lymphatic: No significant vascular findings are present. No enlarged abdominal or pelvic lymph nodes. Reproductive: The prostate and seminal vesicles are unremarkable. Other: No abdominopelvic free fluid or air. Small fat containing inguinal hernias. No bowel containing hernia. Musculoskeletal: There are numerous lucent lesions within the vertebral bodies and transverse processes of the lumbar spine compatible with osseous metastatic disease. Additional lucent lesions present within both ilia including a lesion in the left iliac wing which demonstrates some soft tissue extension (2/90). IMPRESSION: 1. Thick-walled cavitary lesion in the left lung apex with spiculated margins extending to both the pleural surface and superior right hilum, concerning for primary lung malignancy.  Appearance could suggest a squamous cell carcinoma the lung given the location and cavitation though is by no means a diagnostic finding. 2. Multiple additional contralateral sub 5 mm lymph nodes, could be infectious or inflammatory though should be certainly viewed with suspicion and for metastatic disease given other findings throughout the body. 3. Numerous necrotic appearing is lateral mediastinal and right hilar lymph nodes, likely metastatic. 4. Innumerable rim enhancing hepatic lesions throughout both lobes of the liver compatible with solid organ metastasis. Additional geographic region of hyperattenuation in the periphery of the right upper lobe, may reflect a transient hepatic attenuation difference though certainly an underlying lesion in this location is not excluded. Attention on follow-up recommended. 5. There are multiple lytic appearing lesions throughout the thoracic and lumbar spine as well as portions of the bony pelvis. 6. Soft tissue extension of an osseous lesion involving the left iliac wing. 7. Pathologic split type compression fracture of the T3 vertebral body with approximately 30% height loss. These results were called by telephone at the time of interpretation on 05/10/2019 at 10:05 pm to provider GARRETT GREEN , who verbally acknowledged these results. Electronically Signed   By: Lovena Le M.D.   On: 05/10/2019 22:05   US BIOPSY (LIVER)  Result Date: 05/17/2019 CLINICAL DATA:  Multiple liver lesions worrisome for metastatic disease EXAM: ULTRASOUND-GUIDED CORE LIVER LESION BIOPSY TECHNIQUE: An ultrasound guided liver biopsy was thoroughly discussed with the patient and questions were answered. The benefits, risks, alternatives, and complications were also discussed. The patient understands and wishes to proceed with the procedure. A verbal as well as written consent was obtained. Survey ultrasound of the liver was performed, a representative lesion identified, and an appropriate  skin entry site was determined. Skin site was marked, prepped with chlorhexidine, and draped in usual sterile fashion, and infiltrated locally with 1% lidocaine. Intravenous Fentanyl 129mg and Versed 284mwere administered as conscious sedation during continuous monitoring of the patient's level of consciousness and physiological / cardiorespiratory status by the radiology RN, with a total moderate sedation time of 12 minutes. a 1734auge trocar needle was advanced under ultrasound guidance into the liver to the margin of the lesion. 3 coaxial 18gauge core samples were then obtained through the guide needle. The guide needle was removed. Post procedure scans demonstrate no apparent complication. COMPLICATIONS: COMPLICATIONS None immediate FINDINGS: Multiple hypoechoic lesions were identified corresponding to CT findings. Representative core biopsy samples obtained as above. IMPRESSION: 1. Technically successful ultrasound guided core liver lesion biopsy. Electronically Signed  By: Lucrezia Europe M.D.   On: 05/17/2019 07:14   MR ORBITS W CONTRAST  Result Date: 05/10/2019 CLINICAL DATA:  Left retinal lesion, headaches EXAM: MRI HEAD AND ORBITS WITHOUT AND WITH CONTRAST TECHNIQUE: Multiplanar, multiecho pulse sequences of the brain and surrounding structures were obtained without and with intravenous contrast. Multiplanar, multiecho pulse sequences of the orbits and surrounding structures were obtained including fat saturation techniques, before and after intravenous contrast administration. CONTRAST:  52m GADAVIST GADOBUTROL 1 MMOL/ML IV SOLN COMPARISON:  None. FINDINGS: Intravenous contrast was injected for MRI of the lumbar spine shortly before this study. Therefore, contrast remains present and all images are effectively postcontrast MRI HEAD FINDINGS Brain: There are multiple (greater than 30) subcentimeter parenchymal enhancing lesions including involvement of the cerebellum and right midbrain tectum. There is  mild edema associated with some of the lesions. A minority also demonstrate corresponding susceptibility, which may reflect intralesional hemorrhage or mineralization. There is no acute infarction. No hydrocephalus. No significant mass effect. Vascular: Major vessel flow voids at the skull base are preserved. Skull and upper cervical spine: Probable areas of abnormal marrow enhancement at the skull base and C2 posterior elements Other: Mastoid air cells are clear. MRI ORBITS FINDINGS Orbits: No proptosis. Suspected thin enhancing tissue at the posterior aspect of the left globe. Extraocular muscles are symmetric and unremarkable. There is no abnormal enhancement of the optic nerve sheath complexes. Visualized sinuses: Minor mucosal thickening. Soft tissues: Unremarkable. IMPRESSION: Numerous enhancing parenchymal intracranial lesions likely reflecting metastases. Mild associated edema. No mass effect. Probable areas of abnormal marrow enhancement reflecting osseous metastases at the skull base and upper cervical spine. Suspected abnormal thin enhancing tissue at the posterior aspect of the left lobe. Electronically Signed   By: PMacy MisM.D.   On: 05/10/2019 19:29    ASSESSMENT & PLAN Jaevon JIsidoro Donning89y.o. male with medical history significant for hypertension who presents to establish care for a new diagnosis of metastatic lung cancer.  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  Unfortunately the pathology review was unable to thin down the tissue of origin, but given prior chest imaging that showed nodule in that place as well as the apparent primary lesion in the right lung I would be confident calling this an adenocarcinoma of the lung.  We will perform foundation 1 as well as PD-L1 testing in order to help confirm this diagnosis.  Additionally the patient is a lifelong non-smoker and adenocarcinomas tend to be  the most common type of lung cancer in that patient population.  In regards to treatment at this time I would recommend evaluation by radiation oncology for consideration of treatment to his brain metastases as well as focal treatment to his bone lesions in order to help improve his symptoms.  In terms of systemic treatment I would recommend carboplatin, pembrolizumab, and pemetrexed as first-line therapy while we await the results of the foundation 1 studies.  I have reviewed in brief with patient what to expect with this chemotherapy regimen.  I have also noted that he will require port placement as well as chemotherapy education.  I anticipate that we will be able to start this within the next 2 weeks.  At this time no further imaging or biopsies are required from our standpoint.  In terms of symptom control we have the patient on 8 mg of Dex p.o. daily as well as a prescription for oxycodone  5 to 10 mg p.o. every 6 hours.  We are happy to titrate this up or combine ibuprofen and Tylenol with these medications in order to improve his pain control.  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  I was clear with the patient that his overall prognosis is poor and that eventually this disease will claim his life.  Was straightforward with him that all treatments will be focused on improving his symptoms, extending his lifespan, and shrinking the tumor, but was very clear that there is no treatment available that would remove the cancer entirely from his body.  He and his wife tearfully her understanding of this prognosis and willing to go forward with treatment.  The planned regimen consists of pembrolizumab 200 mg on day 1, pemetrexed 500 mg per metered squared IV on day 1, and carboplatin AUC of 5 on day 1.  This is performed every 21 days for a maximum of 4 cycles followed by  maintenance therapy with pembrolizumab every 3 weeks.  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 Pathology could not definitively say, but he had a prior nodule on CXR and mass in lung appears to be the primary lesion --tissue sent for Foundation One and PD-L1 immunohistochemistry --plan to start treatment as soon as we are able with Carbo/Pem/Pem (regimen detailed above) --plan for port placement and chemotherapy education to occur prior to treatment --pain control regimen as listed below  --appreciate the assistance of [name] and Radiation oncology.   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 and radiation oncology for assistance in management of brain mets.  --recommend dexamethasone 53m PO daily and oxycodone [id number] PRN for pain control   #Osseus Lesions/Hip Pain --oxycodone as above --recommend evaluation by radiation oncology   #Left Eye Vision Changes --patient to be [facility]-possible direct involvement of globe vs occipital lobe lesion --patient tried eye patch but found it irritating  --continue to monitor   #Rapid Weight Loss --encourage high calorie diet --possible increase appetite and weight from steroid therapy --continue to monitor  No orders of the defined types were placed in this encounter.   All questions were answered. The patient knows to call the clinic with any problems, questions or concerns.  A total of more than 60 minutes were spent on this encounter and over half of that time was spent on counseling and coordination of care as outlined above.   JoLedell PeoplesMD Department of Hematology/Oncology CoMattydalet [facility] LLChone: [phone]: [phone]: [email or web address]  05/19/2019 12:05 PM   Literature Support:   GaLorenza ChickRodrguez-Abreu D, GaDuffy RhodyFelip E, De Angelis F, Domine M, ClSunset AcresHochmair MJ, PoAshlandChRansonBischoff HG, PeLake CarrollGrossi F, JeFairacresReck M, HuTallapoosaGaBowling GreenBoHaydenRubio-Viqueira B, Novello S, Kurata T, Gray JE, Vida J, Wei Z, Yang J, Raftopoulos H, PiNew WoodvilleGaPowellsvilleCWeyerhaeuser [id number] Investigators. Pembrolizumab plus Chemotherapy in Metastatic Non-Small-Cell Lung Cancer. N Alta Corninged. 2018 May 31;378(22):2078-2092.   --in patients with previously untreated metastatic nonsquamous NSCLC without EGFR or ALK mutations, the  addition of pembrolizumab to standard chemotherapy of pemetrexed and a platinum-based drug resulted in significantly longer overall survival and progression-free survival than chemotherapy alone.  -- Median progression-free survival was 8.8 months (95% CI, 7.6 to 9.2) in the pembrolizumab-combination group and 4.9 months (95% CI, 4.7 to 5.5) in the placebo-combination group (hazard ratio for disease progression or death, 0.52; 95% CI, 0.43 to 0.64; P<0.001).

## 2019-05-19 NOTE — Telephone Encounter (Signed)
A new patient appt has been scheduled for [name] to see [name] on 4/27 at 11am. Pt has been cld and made aware to arrive 15 minutes early.

## 2019-05-21 ENCOUNTER — Encounter: Payer: Self-pay | Admitting: Hematology and Oncology

## 2019-05-21 DIAGNOSIS — Z7189 Other specified counseling: Secondary | ICD-10-CM | POA: Insufficient documentation

## 2019-05-21 DIAGNOSIS — C7931 Secondary malignant neoplasm of brain: Secondary | ICD-10-CM | POA: Insufficient documentation

## 2019-05-21 DIAGNOSIS — C3490 Malignant neoplasm of unspecified part of unspecified bronchus or lung: Secondary | ICD-10-CM | POA: Insufficient documentation

## 2019-05-22 ENCOUNTER — Encounter: Payer: Self-pay | Admitting: *Deleted

## 2019-05-22 NOTE — Progress Notes (Signed)
I was updated Friday of Ms. Barnhart's case.  I followed up with pathology, Dr. Lyndon Code and he states bx did not stain for lung cancer it looks like GI.  I updated CSW and GI navigator of Mr. Virani case.

## 2019-05-23 ENCOUNTER — Other Ambulatory Visit: Payer: Self-pay

## 2019-05-23 ENCOUNTER — Encounter: Payer: Self-pay | Admitting: Internal Medicine

## 2019-05-23 ENCOUNTER — Inpatient Hospital Stay (HOSPITAL_BASED_OUTPATIENT_CLINIC_OR_DEPARTMENT_OTHER): Payer: Self-pay | Admitting: Internal Medicine

## 2019-05-23 VITALS — BP 144/97 | HR 110 | Temp 98.3°F | Resp 18 | Ht 67.0 in | Wt 207.3 lb

## 2019-05-23 DIAGNOSIS — C7931 Secondary malignant neoplasm of brain: Secondary | ICD-10-CM

## 2019-05-23 NOTE — Progress Notes (Signed)
[facility] [place], [place] [address] [phone]   New Patient Evaluation  Date of Service: 05/23/19 Patient Name: William Ali Patient MRN: [id number] Patient DOB: 1985-12-21 Provider: [name], MD  Identifying Statement:  William Ali is a 34 y.o. male with Metastatic adenocarcinoma to brain [facility]) [C79.31] who presents for initial consultation and evaluation regarding cancer associated neurologic deficits.    Referring Provider: Corena Herter, PA-C [place],  [place] [address]  Primary Cancer: Lung vs GI primary   CNS Oncologic History:  History of Present Illness: The patient's records from the referring physician were obtained and reviewed and the patient interviewed to confirm this HPI.  William Ali presents to clinic today after lung cancer staging workup uncovered multiple brain metastases.  He complains of pain and tingling affecting his left leg, with symptoms moving down back of leg from lower back.  This may improve with change in position.  He also describes significant visual alteration affecting his left eye.  This is currently being evaluated by [facility] ophthalmologist, etiology is unclear.  He also describes headaches "in temples" which occur daily when exposed to a lot of light.  Symptoms resolve with covering of left eye or darkening of room.  Currently taking 8mg  daily decadron. Otherwise, he is walking and talking normally, not currently working, taking care of his two children at home (10 and 7).     Medications: Current Outpatient Medications on File Prior to Visit  Medication Sig Dispense Refill  . albuterol (VENTOLIN HFA) 108 (90 Base) MCG/ACT inhaler Inhale 2 puffs into the lungs every 6 (six) hours as needed for wheezing or shortness of breath. 8 g 1  . benzonatate (TESSALON) 100 MG capsule Take 1 capsule (100 mg total) by mouth every 8 (eight) hours. 21 capsule 0  . cetirizine  (ZYRTEC) 10 MG tablet Take 1 tablet (10 mg total) by mouth daily. 30 tablet 0  . dexamethasone (DECADRON) 4 MG tablet Take 2 tablets (8 mg total) by mouth daily. 15 tablet 2  . hydrochlorothiazide (HYDRODIURIL) 25 MG tablet Take 1 tablet (25 mg total) by mouth daily. 30 tablet 1  . oxyCODONE (OXY IR/ROXICODONE) 5 MG immediate release tablet Take 1-2 tablets (5-10 mg total) by mouth every 6 (six) hours as needed for moderate pain or severe pain. 30 tablet 0  . pantoprazole (PROTONIX) 40 MG tablet Take 40 mg by mouth daily.    . sodium chloride (OCEAN) 0.65 % SOLN nasal spray Place 1 spray into both nostrils as needed for congestion. 44 mL 0  . traMADol (ULTRAM) 50 MG tablet Take 1 tablet (50 mg total) by mouth every 6 (six) hours as needed. 15 tablet 0  . HYDROcodone-acetaminophen (NORCO/VICODIN) 5-325 MG tablet Take 1 tablet by mouth every 4 (four) hours as needed for up to 10 doses. (Patient not taking: Reported on 05/23/2019) 10 tablet 0  . [DISCONTINUED] amLODipine (NORVASC) 5 MG tablet Take 1 tablet (5 mg total) by mouth daily. 30 tablet 5  . [DISCONTINUED] famotidine (PEPCID) 20 MG tablet Take 1 tablet (20 mg total) by mouth 2 (two) times daily. (Patient taking differently: Take 20 mg by mouth 2 (two) times daily as needed for heartburn. ) 30 tablet 6   No current facility-administered medications on file prior to visit.    Allergies:  Allergies  Allergen Reactions  . Onion Anaphylaxis  . Other  All sea food causes swelling and a rash.    . Peanut Oil Itching  . Peanut-Containing Drug Products Itching   Past Medical History:  Past Medical History:  Diagnosis Date  . Back pain   . Hypertension    Past Surgical History:  Past Surgical History:  Procedure Laterality Date  . KNEE SURGERY     Social History:  Social History   Socioeconomic History  . Marital status: Married    Spouse name: Not on file  . Number of children: Not on file  . Years of education: Not on file  .  Highest education level: Not on file  Occupational History  . Not on file  Tobacco Use  . Smoking status: Never Smoker  . Smokeless tobacco: Never Used  Substance and Sexual Activity  . Alcohol use: No  . Drug use: No  . Sexual activity: Yes  Other Topics Concern  . Not on file  Social History Narrative  . Not on file   Social Determinants of Health   Financial Resource Strain:   . Difficulty of Paying Living Expenses:   Food Insecurity:   . Worried About Charity fundraiser in the Last Year:   . Arboriculturist in the Last Year:   Transportation Needs:   . Film/video editor (Medical):   Marland Kitchen Lack of Transportation (Non-Medical):   Physical Activity:   . Days of Exercise per Week:   . Minutes of Exercise per Session:   Stress:   . Feeling of Stress :   Social Connections:   . Frequency of Communication with Friends and Family:   . Frequency of Social Gatherings with Friends and Family:   . Attends Religious Services:   . Active Member of Clubs or Organizations:   . Attends Archivist Meetings:   Marland Kitchen Marital Status:   Intimate Partner Violence:   . Fear of Current or Ex-Partner:   . Emotionally Abused:   Marland Kitchen Physically Abused:   . Sexually Abused:    Family History:  Family History  Problem Relation Age of Onset  . Hypertension Mother   . Diabetes Father     Review of Systems: Constitutional: Doesn't report fevers, chills or abnormal weight loss Eyes: Doesn't report blurriness of vision Ears, nose, mouth, throat, and face: Doesn't report sore throat Respiratory: Doesn't report cough, dyspnea or wheezes Cardiovascular: Doesn't report palpitation, chest discomfort  Gastrointestinal:  Doesn't report nausea, constipation, diarrhea GU: Doesn't report incontinence Skin: Doesn't report skin rashes Neurological: Per HPI Musculoskeletal: Doesn't report joint pain Behavioral/Psych: Doesn't report anxiety  Physical Exam: Vitals:   05/23/19 1129 05/23/19  1130  BP: (!) 163/102 (!) 144/97  Pulse: (!) 110   Resp: 18   Temp: 98.3 F (36.8 C)   SpO2: 100%    KPS: 80. General: Alert, cooperative, pleasant, in no acute distress Head: Normal EENT: No conjunctival injection or scleral icterus.  Lungs: Resp effort normal Cardiac: Regular rate Abdomen: Non-distended abdomen Skin: No rashes cyanosis or petechiae. Extremities: No clubbing or edema  Neurologic Exam: Mental Status: Awake, alert, attentive to examiner. Oriented to self and environment. Language is fluent with intact comprehension.  Cranial Nerves: Visual acuity is impaired in left eye, but visual fields are full. Extra-ocular movements intact. No ptosis. Face is symmetric Motor: Tone and bulk are normal. Power is full in both arms and legs. Reflexes are symmetric, no pathologic reflexes present.  Sensory: Decreased upper left leg Gait: Normal.   Labs:  I have reviewed the data as listed    Component Value Date/Time   NA 139 05/10/2019 1400   K 4.5 05/10/2019 1400   CL 100 05/10/2019 1400   CO2 27 05/10/2019 1400   GLUCOSE 99 05/10/2019 1400   BUN 15 05/10/2019 1400   CREATININE 1.37 (H) 05/10/2019 1400   CALCIUM 9.8 05/10/2019 1400   PROT 8.5 (H) 05/10/2019 1400   ALBUMIN 4.3 05/10/2019 1400   AST 51 (H) 05/10/2019 1400   ALT 66 (H) 05/10/2019 1400   ALKPHOS 261 (H) 05/10/2019 1400   BILITOT 1.0 05/10/2019 1400   GFRNONAA >60 05/10/2019 1400   GFRAA >60 05/10/2019 1400   Lab Results  Component Value Date   WBC 13.9 (H) 05/16/2019   NEUTROABS 9.7 (H) 05/10/2019   HGB 14.2 05/16/2019   HCT 44.2 05/16/2019   MCV 82.6 05/16/2019   PLT 393 05/16/2019    Imaging:  DG Chest 2 View  Result Date: 05/06/2019 CLINICAL DATA:  Chest pain EXAM: CHEST - 2 VIEW COMPARISON:  05/01/2018 FINDINGS: Small opacity of the right upper lobe. Lungs are otherwise clear. No pleural effusion or pneumothorax. Cardiomediastinal contours are within normal limits. There is no acute  osseous abnormality. IMPRESSION: Small right upper lobe opacity suspicious for pneumonia. Recommend repeat radiograph post treatment. Electronically Signed   By: Macy Mis M.D.   On: 05/06/2019 11:56   DG Lumbar Spine Complete  Result Date: 05/06/2019 CLINICAL DATA:  Low back pain radiating into left extremity EXAM: LUMBAR SPINE - COMPLETE 4+ VIEW COMPARISON:  2012 FINDINGS: Stable vertebral body heights. Stable to slightly increased retrolisthesis at L2-L3, L3-L4, and L4-L5. Multilevel facet hypertrophy has progressed. Mild disc space narrowing is stable to mildly progressed. Congenital narrowing of the spinal canal. No evidence of spondylolysis. 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 Electronically Signed   By: Macy Mis M.D.   On: 05/06/2019 12:43   DG Pelvis 1-2 Views  Result Date: 05/10/2019 CLINICAL DATA:  Acute left hip pain without known injury. EXAM: PELVIS - 1-2 VIEW COMPARISON:  None. FINDINGS: There is no evidence of pelvic fracture or diastasis. No pelvic bone lesions are seen. IMPRESSION: Negative. Electronically Signed   By: Marijo Conception M.D.   On: 05/10/2019 14:43   CT Chest W Contrast  Result Date: 05/10/2019 CLINICAL DATA:  Cancer of unknown primary, worsening headaches, neck and back pain with numerous intracranial lesions worrisome for metastatic disease EXAM: CT CHEST, ABDOMEN, AND PELVIS WITH CONTRAST TECHNIQUE: Multidetector CT imaging of the chest, abdomen and pelvis was performed following the standard protocol during bolus administration of intravenous contrast. CONTRAST:  118mL OMNIPAQUE IOHEXOL 300 MG/ML  SOLN COMPARISON:  Numerous comparison radiographs of the chest, most recently 05/06/2019 FINDINGS: CT CHEST FINDINGS Cardiovascular: The aortic root is suboptimally assessed given cardiac pulsation artifact. The aorta is normal caliber without acute luminal or periaortic abnormality. Normal 3  vessel branching of the aortic arch. Proximal great vessels opacified normally. Central pulmonary arteries are normal caliber. No large central filling defects are identified on this non tailored examination of the pulmonary arteries. Normal heart size. No pericardial effusion. Mediastinum/Nodes: Multiple right paratracheal and hilar lymph nodes are present with some low central attenuation which may suggest necrosis, representative nodes include a 12 mm low right paratracheal node (4R, 3/74), 10 mm low right precarinal node (4R, 2/21), and a a 19 mm right hilar lymph node (10R, 2/44). Lungs/Pleura: In the right lung apex  is a 3.0 x 3.0 x 2.8 cm lesion with spiculated margins extending to both the pleural surface and superior right hilum. Additional subsided sub 5 mm nodules are seen in the left lung apex (6/40, 6/56), superior segment left lower lobe (6/59). No consolidation. No convincing features of edema. No pneumothorax or effusion. Musculoskeletal: There are numerous lytic appearing lesions throughout the thoracic spine involving both the vertebral bodies and posterior elements of multiple levels. Notably, a hypoattenuating lesion in the L3 vertebral body demonstrates a superimposed pathologic compression deformity with approximately 30% height loss. No worrisome chest wall lesion. CT ABDOMEN PELVIS FINDINGS Hepatobiliary: There are innumerable peripherally enhancing centrally hypoattenuating lesions seen throughout the liver. The largest lesion is seen within the caudate lobe measuring up to 2.8 cm in size (2/47). An additional 2.4 cm lesion in the left lobe liver (2/52). Additional geographic region of hyperattenuation in the periphery of the right upper lobe, may reflect a transient hepatic attenuation difference though certainly an underlying lesion in this location is not excluded. Gallbladder is unremarkable. No calcified gallstones. No biliary ductal dilatation. Pancreas: Unremarkable. No pancreatic  ductal dilatation or surrounding inflammatory changes. Spleen: Normal in size without focal abnormality. Adrenals/Urinary Tract: Adrenal glands are unremarkable. Kidneys are normal, without renal calculi, focal lesion, or hydronephrosis. Bladder is unremarkable. High attenuation material within the urinary bladder likely related to recent contrast enhanced brain MRI. Stomach/Bowel: Distal esophagus, stomach and duodenal sweep are unremarkable. No small bowel wall thickening or dilatation. A normal appendix is visualized. No colonic dilatation or wall thickening. No evidence of obstruction. Vascular/Lymphatic: No significant vascular findings are present. No enlarged abdominal or pelvic lymph nodes. Reproductive: The prostate and seminal vesicles are unremarkable. Other: No abdominopelvic free fluid or air. Small fat containing inguinal hernias. No bowel containing hernia. Musculoskeletal: There are numerous lucent lesions within the vertebral bodies and transverse processes of the lumbar spine compatible with osseous metastatic disease. Additional lucent lesions present within both ilia including a lesion in the left iliac wing which demonstrates some soft tissue extension (2/90). IMPRESSION: 1. Thick-walled cavitary lesion in the left lung apex with spiculated margins extending to both the pleural surface and superior right hilum, concerning for primary lung malignancy. Appearance could suggest a squamous cell carcinoma the lung given the location and cavitation though is by no means a diagnostic finding. 2. Multiple additional contralateral sub 5 mm lymph nodes, could be infectious or inflammatory though should be certainly viewed with suspicion and for metastatic disease given other findings throughout the body. 3. Numerous necrotic appearing is lateral mediastinal and right hilar lymph nodes, likely metastatic. 4. Innumerable rim enhancing hepatic lesions throughout both lobes of the liver compatible with solid  organ metastasis. Additional geographic region of hyperattenuation in the periphery of the right upper lobe, may reflect a transient hepatic attenuation difference though certainly an underlying lesion in this location is not excluded. Attention on follow-up recommended. 5. There are multiple lytic appearing lesions throughout the thoracic and lumbar spine as well as portions of the bony pelvis. 6. Soft tissue extension of an osseous lesion involving the left iliac wing. 7. Pathologic split type compression fracture of the T3 vertebral body with approximately 30% height loss. These results were called by telephone at the time of interpretation on 05/10/2019 at 10:05 pm to provider GARRETT GREEN , who verbally acknowledged these results. Electronically Signed   By: Lovena Le M.D.   On: 05/10/2019 22:05   MR BRAIN W CONTRAST  Result Date: 05/10/2019  CLINICAL DATA:  Left retinal lesion, headaches EXAM: MRI HEAD AND ORBITS WITHOUT AND WITH CONTRAST TECHNIQUE: Multiplanar, multiecho pulse sequences of the brain and surrounding structures were obtained without and with intravenous contrast. Multiplanar, multiecho pulse sequences of the orbits and surrounding structures were obtained including fat saturation techniques, before and after intravenous contrast administration. CONTRAST:  54mL GADAVIST GADOBUTROL 1 MMOL/ML IV SOLN COMPARISON:  None. FINDINGS: Intravenous contrast was injected for MRI of the lumbar spine shortly before this study. Therefore, contrast remains present and all images are effectively postcontrast MRI HEAD FINDINGS Brain: There are multiple (greater than 30) subcentimeter parenchymal enhancing lesions including involvement of the cerebellum and right midbrain tectum. There is mild edema associated with some of the lesions. A minority also demonstrate corresponding susceptibility, which may reflect intralesional hemorrhage or mineralization. There is no acute infarction. No hydrocephalus. No  significant mass effect. Vascular: Major vessel flow voids at the skull base are preserved. Skull and upper cervical spine: Probable areas of abnormal marrow enhancement at the skull base and C2 posterior elements Other: Mastoid air cells are clear. MRI ORBITS FINDINGS Orbits: No proptosis. Suspected thin enhancing tissue at the posterior aspect of the left globe. Extraocular muscles are symmetric and unremarkable. There is no abnormal enhancement of the optic nerve sheath complexes. Visualized sinuses: Minor mucosal thickening. Soft tissues: Unremarkable. IMPRESSION: Numerous enhancing parenchymal intracranial lesions likely reflecting metastases. Mild associated edema. No mass effect. Probable areas of abnormal marrow enhancement reflecting osseous metastases at the skull base and upper cervical spine. Suspected abnormal thin enhancing tissue at the posterior aspect of the left lobe. Electronically Signed   By: Macy Mis M.D.   On: 05/10/2019 19:29   MR Lumbar Spine W Wo Contrast  Addendum Date: 05/10/2019   ADDENDUM REPORT: 05/10/2019 17:37 ADDENDUM: Postcontrast images were not reviewed initially. There is enhancement corresponding to STIR hyperintense lesions. Additionally, there is extraosseous disease encroaching on the right S1 foramen. Electronically Signed   By: Macy Mis M.D.   On: 05/10/2019 17:37   Result Date: 05/10/2019 CLINICAL DATA:  Low back pain, left radiculopathy EXAM: MRI LUMBAR SPINE WITHOUT AND WITH CONTRAST TECHNIQUE: Multiplanar and multiecho pulse sequences of the lumbar spine were obtained without and with intravenous contrast. CONTRAST:  34mL GADAVIST GADOBUTROL 1 MMOL/ML IV SOLN COMPARISON:  None. FINDINGS: Segmentation:  Standard. Alignment:  Trace retrolisthesis at L3-L4 Vertebrae: Small Schmorl's nodes are present. No compression deformity. There are STIR hyperintense lesions involving all levels as well as the bony pelvis. No evidence of significant epidural disease  extension. Conus medullaris and cauda equina: Conus extends to the L1 level. Conus and cauda equina appear normal. Paraspinal and other soft tissues: Unremarkable. Disc levels: Imaged in the sagittal plane only, there is a small central disc protrusion and annular fissure at T11-T12. L1-L2: Mild facet hypertrophy and prominence of dorsal epidural fat. No significant canal or foraminal stenosis. L2-L3: Mild facet hypertrophy and prominence of dorsal epidural fat. No significant canal or foraminal stenosis. L3-L4: Left subarticular/foraminal disc protrusion. Mild facet arthropathy with ligamentum flavum infolding and mild prominence of dorsal epidural fat. No significant canal stenosis. Effacement of the left lateral recess with compression of the traversing L4 nerve root. No right foraminal stenosis. Mild left foraminal stenosis. L4-L5: Mild disc bulge. Mild facet arthropathy with ligamentum flavum infolding and mild prominence of dorsal epidural fat. No significant canal or foraminal stenosis. L5-S1: Mild facet arthropathy and prominence of epidural fat. No significant canal or foraminal stenosis. IMPRESSION: Multilevel, multifocal  osseous lesions suspicious for metastatic disease. No compression deformity or epidural disease. Disc protrusion at L3-L4 compressing the left L4 nerve root. Electronically Signed: By: Macy Mis M.D. On: 05/10/2019 16:38   CT ABDOMEN PELVIS W CONTRAST  Result Date: 05/10/2019 CLINICAL DATA:  Cancer of unknown primary, worsening headaches, neck and back pain with numerous intracranial lesions worrisome for metastatic disease EXAM: CT CHEST, ABDOMEN, AND PELVIS WITH CONTRAST TECHNIQUE: Multidetector CT imaging of the chest, abdomen and pelvis was performed following the standard protocol during bolus administration of intravenous contrast. CONTRAST:  181mL OMNIPAQUE IOHEXOL 300 MG/ML  SOLN COMPARISON:  Numerous comparison radiographs of the chest, most recently 05/06/2019 FINDINGS:  CT CHEST FINDINGS Cardiovascular: The aortic root is suboptimally assessed given cardiac pulsation artifact. The aorta is normal caliber without acute luminal or periaortic abnormality. Normal 3 vessel branching of the aortic arch. Proximal great vessels opacified normally. Central pulmonary arteries are normal caliber. No large central filling defects are identified on this non tailored examination of the pulmonary arteries. Normal heart size. No pericardial effusion. Mediastinum/Nodes: Multiple right paratracheal and hilar lymph nodes are present with some low central attenuation which may suggest necrosis, representative nodes include a 12 mm low right paratracheal node (4R, 3/74), 10 mm low right precarinal node (4R, 2/21), and a a 19 mm right hilar lymph node (10R, 2/44). Lungs/Pleura: In the right lung apex is a 3.0 x 3.0 x 2.8 cm lesion with spiculated margins extending to both the pleural surface and superior right hilum. Additional subsided sub 5 mm nodules are seen in the left lung apex (6/40, 6/56), superior segment left lower lobe (6/59). No consolidation. No convincing features of edema. No pneumothorax or effusion. Musculoskeletal: There are numerous lytic appearing lesions throughout the thoracic spine involving both the vertebral bodies and posterior elements of multiple levels. Notably, a hypoattenuating lesion in the L3 vertebral body demonstrates a superimposed pathologic compression deformity with approximately 30% height loss. No worrisome chest wall lesion. CT ABDOMEN PELVIS FINDINGS Hepatobiliary: There are innumerable peripherally enhancing centrally hypoattenuating lesions seen throughout the liver. The largest lesion is seen within the caudate lobe measuring up to 2.8 cm in size (2/47). An additional 2.4 cm lesion in the left lobe liver (2/52). Additional geographic region of hyperattenuation in the periphery of the right upper lobe, may reflect a transient hepatic attenuation difference  though certainly an underlying lesion in this location is not excluded. Gallbladder is unremarkable. No calcified gallstones. No biliary ductal dilatation. Pancreas: Unremarkable. No pancreatic ductal dilatation or surrounding inflammatory changes. Spleen: Normal in size without focal abnormality. Adrenals/Urinary Tract: Adrenal glands are unremarkable. Kidneys are normal, without renal calculi, focal lesion, or hydronephrosis. Bladder is unremarkable. High attenuation material within the urinary bladder likely related to recent contrast enhanced brain MRI. Stomach/Bowel: Distal esophagus, stomach and duodenal sweep are unremarkable. No small bowel wall thickening or dilatation. A normal appendix is visualized. No colonic dilatation or wall thickening. No evidence of obstruction. Vascular/Lymphatic: No significant vascular findings are present. No enlarged abdominal or pelvic lymph nodes. Reproductive: The prostate and seminal vesicles are unremarkable. Other: No abdominopelvic free fluid or air. Small fat containing inguinal hernias. No bowel containing hernia. Musculoskeletal: There are numerous lucent lesions within the vertebral bodies and transverse processes of the lumbar spine compatible with osseous metastatic disease. Additional lucent lesions present within both ilia including a lesion in the left iliac wing which demonstrates some soft tissue extension (2/90). IMPRESSION: 1. Thick-walled cavitary lesion in the left lung apex  with spiculated margins extending to both the pleural surface and superior right hilum, concerning for primary lung malignancy. Appearance could suggest a squamous cell carcinoma the lung given the location and cavitation though is by no means a diagnostic finding. 2. Multiple additional contralateral sub 5 mm lymph nodes, could be infectious or inflammatory though should be certainly viewed with suspicion and for metastatic disease given other findings throughout the body. 3.  Numerous necrotic appearing is lateral mediastinal and right hilar lymph nodes, likely metastatic. 4. Innumerable rim enhancing hepatic lesions throughout both lobes of the liver compatible with solid organ metastasis. Additional geographic region of hyperattenuation in the periphery of the right upper lobe, may reflect a transient hepatic attenuation difference though certainly an underlying lesion in this location is not excluded. Attention on follow-up recommended. 5. There are multiple lytic appearing lesions throughout the thoracic and lumbar spine as well as portions of the bony pelvis. 6. Soft tissue extension of an osseous lesion involving the left iliac wing. 7. Pathologic split type compression fracture of the T3 vertebral body with approximately 30% height loss. These results were called by telephone at the time of interpretation on 05/10/2019 at 10:05 pm to provider GARRETT GREEN , who verbally acknowledged these results. Electronically Signed   By: Lovena Le M.D.   On: 05/10/2019 22:05   US BIOPSY (LIVER)  Result Date: 05/17/2019 CLINICAL DATA:  Multiple liver lesions worrisome for metastatic disease EXAM: ULTRASOUND-GUIDED CORE LIVER LESION BIOPSY TECHNIQUE: An ultrasound guided liver biopsy was thoroughly discussed with the patient and questions were answered. The benefits, risks, alternatives, and complications were also discussed. The patient understands and wishes to proceed with the procedure. A verbal as well as written consent was obtained. Survey ultrasound of the liver was performed, a representative lesion identified, and an appropriate skin entry site was determined. Skin site was marked, prepped with chlorhexidine, and draped in usual sterile fashion, and infiltrated locally with 1% lidocaine. Intravenous Fentanyl 143mcg and Versed 2mg  were administered as conscious sedation during continuous monitoring of the patient's level of consciousness and physiological / cardiorespiratory  status by the radiology RN, with a total moderate sedation time of 12 minutes. a 16 gauge trocar needle was advanced under ultrasound guidance into the liver to the margin of the lesion. 3 coaxial 18gauge core samples were then obtained through the guide needle. The guide needle was removed. Post procedure scans demonstrate no apparent complication. COMPLICATIONS: COMPLICATIONS None immediate FINDINGS: Multiple hypoechoic lesions were identified corresponding to CT findings. Representative core biopsy samples obtained as above. IMPRESSION: 1. Technically successful ultrasound guided core liver lesion biopsy. Electronically Signed   By: Lucrezia Europe M.D.   On: 05/17/2019 07:14   MR ORBITS W CONTRAST  Result Date: 05/10/2019 CLINICAL DATA:  Left retinal lesion, headaches EXAM: MRI HEAD AND ORBITS WITHOUT AND WITH CONTRAST TECHNIQUE: Multiplanar, multiecho pulse sequences of the brain and surrounding structures were obtained without and with intravenous contrast. Multiplanar, multiecho pulse sequences of the orbits and surrounding structures were obtained including fat saturation techniques, before and after intravenous contrast administration. CONTRAST:  35mL GADAVIST GADOBUTROL 1 MMOL/ML IV SOLN COMPARISON:  None. FINDINGS: Intravenous contrast was injected for MRI of the lumbar spine shortly before this study. Therefore, contrast remains present and all images are effectively postcontrast MRI HEAD FINDINGS Brain: There are multiple (greater than 30) subcentimeter parenchymal enhancing lesions including involvement of the cerebellum and right midbrain tectum. There is mild edema associated with some of the lesions. A minority  also demonstrate corresponding susceptibility, which may reflect intralesional hemorrhage or mineralization. There is no acute infarction. No hydrocephalus. No significant mass effect. Vascular: Major vessel flow voids at the skull base are preserved. Skull and upper cervical spine: Probable  areas of abnormal marrow enhancement at the skull base and C2 posterior elements Other: Mastoid air cells are clear. MRI ORBITS FINDINGS Orbits: No proptosis. Suspected thin enhancing tissue at the posterior aspect of the left globe. Extraocular muscles are symmetric and unremarkable. There is no abnormal enhancement of the optic nerve sheath complexes. Visualized sinuses: Minor mucosal thickening. Soft tissues: Unremarkable. IMPRESSION: Numerous enhancing parenchymal intracranial lesions likely reflecting metastases. Mild associated edema. No mass effect. Probable areas of abnormal marrow enhancement reflecting osseous metastases at the skull base and upper cervical spine. Suspected abnormal thin enhancing tissue at the posterior aspect of the left lobe. Electronically Signed   By: Macy Mis M.D.   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  His left leg numbness, however is unrelated, and is secondary to disc herniation at L4.  The disc itself may be related to compression from vertebral body metastasis superiorly.  It is unclear what is causing the left eye issue, there is no visual field impairment despite presence of right occipital metastasis.     Will discuss case in brain/spine tumor board tomorrow, likely recommend WBRT given burden of disease.  Consult in place for radiation oncology, meeting with Dr. Lisbeth Renshaw on 4/29.  Because of lack of symptoms related to brain mets and little edema in the brain, recommended decreasing decadron to 4mg  daily x7 days, then 2mg  daily x7 days, then 1mg  daily x7 days.  We spent twenty additional minutes teaching regarding the natural history, biology, and historical experience in the treatment of neurologic complications of cancer.   We appreciate the  opportunity to participate in the care of [place].   We ask that Daylin Isidoro Ali return to clinic in 3 months following post RT MRI, or sooner as needed.  All questions were answered. The patient knows to call the clinic with any problems, questions or concerns. No barriers to learning were detected.  The total time spent in the encounter was 40 minutes and more than 50% was on counseling and review of test results   [name], MD Medical Director of Neuro-Oncology [facility] [place] 05/23/19 4:50 PM

## 2019-05-24 ENCOUNTER — Telehealth: Payer: Self-pay | Admitting: *Deleted

## 2019-05-24 ENCOUNTER — Telehealth: Payer: Self-pay | Admitting: Hematology and Oncology

## 2019-05-24 ENCOUNTER — Other Ambulatory Visit: Payer: Self-pay | Admitting: *Deleted

## 2019-05-24 ENCOUNTER — Inpatient Hospital Stay: Payer: Self-pay

## 2019-05-24 DIAGNOSIS — C7931 Secondary malignant neoplasm of brain: Secondary | ICD-10-CM

## 2019-05-24 DIAGNOSIS — C3491 Malignant neoplasm of unspecified part of right bronchus or lung: Secondary | ICD-10-CM

## 2019-05-24 DIAGNOSIS — C771 Secondary and unspecified malignant neoplasm of intrathoracic lymph nodes: Secondary | ICD-10-CM

## 2019-05-24 NOTE — Telephone Encounter (Signed)
TCT patient to inform him of port placement date and time. Spoke with him and informed him that the appt is on 06/01/19 at 1pm, he is to be there @ 12:30. This is at [facility].  Also advised to have nothing by mouth after 8am that day.  [place] voiced understanding.

## 2019-05-24 NOTE — Telephone Encounter (Signed)
Called and spoke with patient about scheduled appt per 4/23 los. Scheduled 5/7 infusion, patient will call back after radiation appt on 4/29 to schedule patient education

## 2019-05-24 NOTE — Progress Notes (Signed)
Location/Histology of Brain Tumor: Metastatic adenocarcinoma to brain  Patient presented after lung cancer staging workup uncovered multiple brain metastases.  He complaints of pain and tingling affecting his left leg, with symptoms moving down back of leg from lower back.  He also complains of significant visual alteration affecting his left eye.  He has headaches " in temples" which occur daily when exposed to a lot of light.  CT CAP 05/10/2019:Thick-walled cavitary lesion in the left lung apex with spiculated margins extending to both the pleural surface and superior right hilum, concerning for primary lung malignancy.   2. Multiple additional contralateral sub 5 mm lymph nodes, could be infectious or inflammatory though should be certainly viewed with suspicion and for metastatic disease given other findings throughout the body. 3. Numerous necrotic appearing is lateral mediastinal and right hilar lymph nodes, likely metastatic. 4. Innumerable rim enhancing hepatic lesions throughout both lobes of the liver compatible with solid organ metastasis. Additional geographic region of hyperattenuation in the periphery of the right upper lobe, may reflect a transient hepatic attenuation difference though certainly an underlying lesion in this location is not excluded. Attention on follow-up recommended. 5. There are multiple lytic appearing lesions throughout the thoracic and lumbar spine as well as portions of the bony pelvis. 6. Soft tissue extension of an osseous lesion involving the left iliac wing.  MRI Brain 05/10/2019: Numerous enhancing parenchymal intracranial lesions likely reflecting metastases.  Greater then 30 sub-centimeter lesions.  MRI Orbits 05/10/2019: Numerous enhancing parenchymal intracranial lesions likely reflecting metastases.  Mild associated edema.  MRI L Spine 05/10/2019: Multilevel, multifocal osseous lesions suspicious for metastatic disease.  No compression deformity or epidural  disease.  Disc protrusion at L3-L4 compressing the left L4 nerve root.  Biopsy of Liver 05/16/2019   Past or anticipated interventions, if any, per neurosurgery:   Past or anticipated interventions, if any, per medical oncology:  [name] 05/23/2019 -William Ali presents today with clinical and radiographic syndrome consistent with innumerable small brain metastases 2/2 either lung or colon primary. -His left leg numbness, however is unrelated, and is secondary to disc herniation at L4.  The disc itself may be related to compression from vertebral body metastasis superiorly.  It is unclear what is causing the left eye issue, there is no visual field impairment despite presence of right occipital metastasis. -Will discuss case in brain/spine tumor board tomorrow, likely recommend WBRT given burden of disease.  Consult in place for radiation oncology, meeting with Dr. Lisbeth Renshaw on 4/29. -Because of lack of symptoms related to brain mets and little edema in the brain, recommended decreasing decadron to 4mg  daily x7 days, then 2mg  daily x7 days, then 1mg  daily x7 days.   Dose of Decadron, if applicable: 8 mg daily.  He has started his taper per Dr. Reche Dixon instructions.  Recent neurologic symptoms, if any:   Seizures: No  Headaches: Yes, taking oxycodone at night.  Nausea: no  Dizziness/ataxia: No  Difficulty with hand coordination: No  Focal numbness/weakness: Yes, left leg  Visual deficits/changes: Left eye, decreased vision, Seeing a doctor at [place]  Confusion/Memory deficits: No   SAFETY ISSUES:  Prior radiation? No  Pacemaker/ICD? No  Possible current pregnancy? n/a  Is the patient on methotrexate? No  Additional Complaints / other details:

## 2019-05-25 ENCOUNTER — Encounter: Payer: Self-pay | Admitting: Radiation Oncology

## 2019-05-25 ENCOUNTER — Other Ambulatory Visit: Payer: Self-pay

## 2019-05-25 ENCOUNTER — Ambulatory Visit
Admission: RE | Admit: 2019-05-25 | Discharge: 2019-05-25 | Disposition: A | Discharge status: Home / Self Care | Payer: Self-pay | Source: Ambulatory Visit | Attending: Radiation Oncology | Admitting: Radiation Oncology

## 2019-05-25 VITALS — Ht 67.0 in | Wt 207.0 lb

## 2019-05-25 DIAGNOSIS — C3491 Malignant neoplasm of unspecified part of right bronchus or lung: Secondary | ICD-10-CM

## 2019-05-25 DIAGNOSIS — C7931 Secondary malignant neoplasm of brain: Secondary | ICD-10-CM

## 2019-05-25 MED ORDER — LORAZEPAM 0.5 MG PO TABS: ORAL_TABLET | ORAL | 0 refills | Status: DC

## 2019-05-26 ENCOUNTER — Telehealth: Payer: Self-pay | Admitting: General Practice

## 2019-05-26 ENCOUNTER — Telehealth: Payer: Self-pay | Admitting: Hematology and Oncology

## 2019-05-26 NOTE — Telephone Encounter (Signed)
R/s appt per 4/29 sch message - pt aware of appt date and time

## 2019-05-26 NOTE — Telephone Encounter (Signed)
[place] CSW Progress Notes  Call to patient to offer support/resources based on needs.  Left VM w my contact information and encouragement to call back.    [name], LCSW Clinical Social Worker Phone:  [phone] Cell:  [phone]

## 2019-05-26 NOTE — Progress Notes (Addendum)
Radiation Oncology         [phone] ________________________________  Initial Outpatient Consultation - Conducted via telephone due to current COVID-19 concerns for limiting patient exposure  I spoke with the patient to conduct this consult visit via telephone to spare the patient unnecessary potential exposure in the healthcare setting during the current COVID-19 pandemic. The patient was notified in advance and was offered a [place] meeting to allow for face to face communication but unfortunately reported that they did not have the appropriate resources/technology to support such a visit and instead preferred to proceed with a telephone consult.   Name: William Ali        MRN: [id number]  Date of Service: 05/25/2019 DOB: August 30, 1985  JY:[id number], No Pcp Per  Mickeal Skinner Acey Lav, MD     REFERRING PHYSICIAN: [name], MD   DIAGNOSIS: The primary encounter diagnosis was Metastatic adenocarcinoma to brain [facility]). A diagnosis of Adenocarcinoma of right lung [facility]) was also pertinent to this visit.   HISTORY OF PRESENT ILLNESS: William Ali is a 34 y.o. male seen at the request of Dr. Lorenso Courier for newly diagnosed stage IV adenocarcinoma of the lung. The patient reports that for at least a year he has been experiencing intermittent episodes of pain in multiple sites in his body, this is prompted multiple ER evaluations which did not clearly identify a cause for his symptoms. He had a recent evaluation in early April that revealed a small mass in the right upper lobe by chest x-ray, he was counseled on the rationale for this to be repeated. Lumbar spine evaluation was also performed with plain imaging on 05/06/2019 revealing progression of degenerative disease since 2012. No acute findings were otherwise seen on x-rays on 05/10/2019 either. An MRI however of the lumbar spine and MRI of the orbits with contrast was performed given his symptoms of radiculopathy and changes in vision. His MRI of  the lumbar spine revealed enhancement especially extraosseous disease encroaching on the right S1 foramen with multilevel focal metastatic appearing disease without compressive deformity or epidural space disease. He had disc protrusion at L3-4 compressing the left L4 nerve root. His MRI of the orbits revealed numerous enhancing parenchymal intracranial lesions likely reflecting metastatic disease in the brain without edema or mass-effect. An MRI of the brain also followed on 05/10/2019 revealing numerous enhancing parenchymal intracranial lesions likely metastatic disease with mild associated edema and no mass-effect, suspected thin enhancing tissue at the posterior aspect of the left globe was noted, and of the brain lesions they were all subcentimeter but greater than 30 in number. CT chest abdomen and pelvis then was performed the same day showing concerns for a thick-walled cavitary lesion in the left lung apex with spiculated margins extending to both the pleural surface and superior right hilum, multiple subcentimeter lymph nodes within the mediastinum and hilum. Lesions in the liver were read as innumerable, rim-enhancing, and there were multiple lytic appearing lesions throughout the thoracic and lumbar spine, bony pelvis, soft tissue extension of the osseous lesion involving the left iliac wing, and pathologic compression fracture of T3 vertebral body with approximately 30% height loss. He underwent a biopsy of the liver lesion on 05/16/2019 which revealed a poorly differentiated adenocarcinoma. Histologic field features favored either lung versus pancreatobiliary primary. He met with Dr. Lorenso Courier and has plans to proceed with Port-A-Cath placement, and systemic immunotherapy and chemotherapy. He is contacted today to discuss the rationale for radiotherapy in a palliative setting.   PREVIOUS  RADIATION THERAPY: No   PAST MEDICAL HISTORY:  Past Medical History:  Diagnosis Date  . Back pain   .  Hypertension        PAST SURGICAL HISTORY: Past Surgical History:  Procedure Laterality Date  . KNEE SURGERY       FAMILY HISTORY:  Family History  Problem Relation Age of Onset  . Hypertension Mother   . Diabetes Father      SOCIAL HISTORY:  reports that he has never smoked. He has never used smokeless tobacco. He reports that he does not drink alcohol or use drugs. The patient reports he is actually never smoked, he denies any known family history of cancer, and states that he lives in [place]. His mother lives in [place] and is currently helping to watch she has 2 children while he and his wife sort out his cancer diagnosis. 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 His 2 children are 7 and 11 and has been doing remote learning. His wife works part-time for a cleaning company.   ALLERGIES: Onion, Other, Peanut oil, and Peanut-containing drug products   MEDICATIONS:  Current Outpatient Medications  Medication Sig Dispense Refill  . albuterol (VENTOLIN HFA) 108 (90 Base) MCG/ACT inhaler Inhale 2 puffs into the lungs every 6 (six) hours as needed for wheezing or shortness of breath. 8 g 1  . cetirizine (ZYRTEC) 10 MG tablet Take 1 tablet (10 mg total) by mouth daily. 30 tablet 0  . dexamethasone (DECADRON) 4 MG tablet Take 2 tablets (8 mg total) by mouth daily. 15 tablet 2  . hydrochlorothiazide (HYDRODIURIL) 25 MG tablet Take 1 tablet (25 mg total) by mouth daily. 30 tablet 1  . oxyCODONE (OXY IR/ROXICODONE) 5 MG immediate release tablet Take 1-2 tablets (5-10 mg total) by mouth every 6 (six) hours as needed for moderate pain or severe pain. 30 tablet 0  . pantoprazole (PROTONIX) 40 MG tablet Take 40 mg by mouth daily.    . benzonatate (TESSALON) 100 MG capsule Take 1 capsule (100 mg total) by mouth every 8 (eight) hours. (Patient not taking: Reported on 05/25/2019) 21 capsule 0  .  HYDROcodone-acetaminophen (NORCO/VICODIN) 5-325 MG tablet Take 1 tablet by mouth every 4 (four) hours as needed for up to 10 doses. (Patient not taking: Reported on 05/23/2019) 10 tablet 0  . LORazepam (ATIVAN) 0.5 MG tablet 1 tab po q 4-6 hours prn or 1 tab po 30 minutes prior to radiation 30 tablet 0  . sodium chloride (OCEAN) 0.65 % SOLN nasal spray Place 1 spray into both nostrils as needed for congestion. (Patient not taking: Reported on 05/25/2019) 44 mL 0  . traMADol (ULTRAM) 50 MG tablet Take 1 tablet (50 mg total) by mouth every 6 (six) hours as needed. (Patient not taking: Reported on 05/25/2019) 15 tablet 0   No current facility-administered medications for this encounter.     REVIEW OF SYSTEMS: On review of systems, the patient reports that that he is really not been feeling well for quite some time, he describes progressive pain in multiple areas of his back and spine, particularly his major main complaint is of the left thigh which starts in the low back and radiates down into his medial and anterior thigh. He discusses no loss of control of bowel or bladder activity, he does also have changes in vision in the left eye, he states that this has   been a bout stable for the last few weeks. He denies any headaches or nausea. No other complaints are verbalized.    PHYSICAL EXAM:  Wt Readings from Last 3 Encounters:  05/25/19 207 lb (93.9 kg)  05/23/19 207 lb 4.8 oz (94 kg)  05/19/19 208 lb 9.6 oz (94.6 kg)   Unable to assess due to encounter type.  ECOG = 1  0 - Asymptomatic (Fully active, able to carry on all predisease activities without restriction)  1 - Symptomatic but completely ambulatory (Restricted in physically strenuous activity but ambulatory and able to carry out work of a light or sedentary nature. For example, light housework, office work)  2 - Symptomatic, <50% in bed during the day (Ambulatory and capable of all self care but unable to carry out any work activities. Up  and about more than 50% of waking hours)  3 - Symptomatic, >50% in bed, but not bedbound (Capable of only limited self-care, confined to bed or chair 50% or more of waking hours)  4 - Bedbound (Completely disabled. Cannot carry on any self-care. Totally confined to bed or chair)  5 - Death   Oken MM, Creech RH, Tormey DC, et al. (1982). "Toxicity and response criteria of the Eastern Cooperative Oncology Group". Am. J. Clin. Oncol. 5 (6): 649-55    LABORATORY DATA:  Lab Results  Component Value Date   WBC 13.9 (H) 05/16/2019   HGB 14.2 05/16/2019   HCT 44.2 05/16/2019   MCV 82.6 05/16/2019   PLT 393 05/16/2019   Lab Results  Component Value Date   NA 139 05/10/2019   K 4.5 05/10/2019   CL 100 05/10/2019   CO2 27 05/10/2019   Lab Results  Component Value Date   ALT 66 (H) 05/10/2019   AST 51 (H) 05/10/2019   ALKPHOS 261 (H) 05/10/2019   BILITOT 1.0 05/10/2019      RADIOGRAPHY: DG Chest 2 View  Result Date: 05/06/2019 CLINICAL DATA:  Chest pain EXAM: CHEST - 2 VIEW COMPARISON:  05/01/2018 FINDINGS: Small opacity of the right upper lobe. Lungs are otherwise clear. No pleural effusion or pneumothorax. Cardiomediastinal contours are within normal limits. There is no acute osseous abnormality. IMPRESSION: Small right upper lobe opacity suspicious for pneumonia. Recommend repeat radiograph post treatment. Electronically Signed   By: Praneil  Patel M.D.   On: 05/06/2019 11:56   DG Lumbar Spine Complete  Result Date: 05/06/2019 CLINICAL DATA:  Low back pain radiating into left extremity EXAM: LUMBAR SPINE - COMPLETE 4+ VIEW COMPARISON:  2012 FINDINGS: Stable vertebral body heights. Stable to slightly increased retrolisthesis at L2-L3, L3-L4, and L4-L5. Multilevel facet hypertrophy has progressed. Mild disc space narrowing is stable to mildly progressed. Congenital narrowing of the spinal canal. No evidence of spondylolysis. IMPRESSION: Progression of degenerative changes since 2012  superimposed on congenital spinal canal narrowing. Electronically Signed   By: Praneil  Patel M.D.   On: 05/06/2019 12:43   DG Pelvis 1-2 Views  Result Date: 05/10/2019 CLINICAL DATA:  Acute left hip pain without known injury. EXAM: PELVIS - 1-2 VIEW COMPARISON:  None. FINDINGS: There is no evidence of pelvic fracture or diastasis. No pelvic bone lesions are seen. IMPRESSION: Negative. Electronically Signed   By: James  Green Jr M.D.   On: 05/10/2019 14:43   CT Chest W Contrast  Result Date: 05/10/2019 CLINICAL DATA:  Cancer of unknown primary, worsening headaches, neck and back pain with numerous intracranial lesions worrisome for metastatic disease EXAM: CT CHEST, ABDOMEN, AND PELVIS   WITH CONTRAST TECHNIQUE: Multidetector CT imaging of the chest, abdomen and pelvis was performed following the standard protocol during bolus administration of intravenous contrast. CONTRAST:  100mL OMNIPAQUE IOHEXOL 300 MG/ML  SOLN COMPARISON:  Numerous comparison radiographs of the chest, most recently 05/06/2019 FINDINGS: CT CHEST FINDINGS Cardiovascular: The aortic root is suboptimally assessed given cardiac pulsation artifact. The aorta is normal caliber without acute luminal or periaortic abnormality. Normal 3 vessel branching of the aortic arch. Proximal great vessels opacified normally. Central pulmonary arteries are normal caliber. No large central filling defects are identified on this non tailored examination of the pulmonary arteries. Normal heart size. No pericardial effusion. Mediastinum/Nodes: Multiple right paratracheal and hilar lymph nodes are present with some low central attenuation which may suggest necrosis, representative nodes include a 12 mm low right paratracheal node (4R, 3/74), 10 mm low right precarinal node (4R, 2/21), and a a 19 mm right hilar lymph node (10R, 2/44). Lungs/Pleura: In the right lung apex is a 3.0 x 3.0 x 2.8 cm lesion with spiculated margins extending to both the pleural surface  and superior right hilum. Additional subsided sub 5 mm nodules are seen in the left lung apex (6/40, 6/56), superior segment left lower lobe (6/59). No consolidation. No convincing features of edema. No pneumothorax or effusion. Musculoskeletal: There are numerous lytic appearing lesions throughout the thoracic spine involving both the vertebral bodies and posterior elements of multiple levels. Notably, a hypoattenuating lesion in the L3 vertebral body demonstrates a superimposed pathologic compression deformity with approximately 30% height loss. No worrisome chest wall lesion. CT ABDOMEN PELVIS FINDINGS Hepatobiliary: There are innumerable peripherally enhancing centrally hypoattenuating lesions seen throughout the liver. The largest lesion is seen within the caudate lobe measuring up to 2.8 cm in size (2/47). An additional 2.4 cm lesion in the left lobe liver (2/52). Additional geographic region of hyperattenuation in the periphery of the right upper lobe, may reflect a transient hepatic attenuation difference though certainly an underlying lesion in this location is not excluded. Gallbladder is unremarkable. No calcified gallstones. No biliary ductal dilatation. Pancreas: Unremarkable. No pancreatic ductal dilatation or surrounding inflammatory changes. Spleen: Normal in size without focal abnormality. Adrenals/Urinary Tract: Adrenal glands are unremarkable. Kidneys are normal, without renal calculi, focal lesion, or hydronephrosis. Bladder is unremarkable. High attenuation material within the urinary bladder likely related to recent contrast enhanced brain MRI. Stomach/Bowel: Distal esophagus, stomach and duodenal sweep are unremarkable. No small bowel wall thickening or dilatation. A normal appendix is visualized. No colonic dilatation or wall thickening. No evidence of obstruction. Vascular/Lymphatic: No significant vascular findings are present. No enlarged abdominal or pelvic lymph nodes. Reproductive: The  prostate and seminal vesicles are unremarkable. Other: No abdominopelvic free fluid or air. Small fat containing inguinal hernias. No bowel containing hernia. Musculoskeletal: There are numerous lucent lesions within the vertebral bodies and transverse processes of the lumbar spine compatible with osseous metastatic disease. Additional lucent lesions present within both ilia including a lesion in the left iliac wing which demonstrates some soft tissue extension (2/90). IMPRESSION: 1. Thick-walled cavitary lesion in the left lung apex with spiculated margins extending to both the pleural surface and superior right hilum, concerning for primary lung malignancy. Appearance could suggest a squamous cell carcinoma the lung given the location and cavitation though is by no means a diagnostic finding. 2. Multiple additional contralateral sub 5 mm lymph nodes, could be infectious or inflammatory though should be certainly viewed with suspicion and for metastatic disease given other findings throughout the   body. 3. Numerous necrotic appearing is lateral mediastinal and right hilar lymph nodes, likely metastatic. 4. Innumerable rim enhancing hepatic lesions throughout both lobes of the liver compatible with solid organ metastasis. Additional geographic region of hyperattenuation in the periphery of the right upper lobe, may reflect a transient hepatic attenuation difference though certainly an underlying lesion in this location is not excluded. Attention on follow-up recommended. 5. There are multiple lytic appearing lesions throughout the thoracic and lumbar spine as well as portions of the bony pelvis. 6. Soft tissue extension of an osseous lesion involving the left iliac wing. 7. Pathologic split type compression fracture of the T3 vertebral body with approximately 30% height loss. These results were called by telephone at the time of interpretation on 05/10/2019 at 10:05 pm to provider GARRETT GREEN , who verbally  acknowledged these results. Electronically Signed   By: Price  DeHay M.D.   On: 05/10/2019 22:05   MR BRAIN W CONTRAST  Result Date: 05/10/2019 CLINICAL DATA:  Left retinal lesion, headaches EXAM: MRI HEAD AND ORBITS WITHOUT AND WITH CONTRAST TECHNIQUE: Multiplanar, multiecho pulse sequences of the brain and surrounding structures were obtained without and with intravenous contrast. Multiplanar, multiecho pulse sequences of the orbits and surrounding structures were obtained including fat saturation techniques, before and after intravenous contrast administration. CONTRAST:  10mL GADAVIST GADOBUTROL 1 MMOL/ML IV SOLN COMPARISON:  None. FINDINGS: Intravenous contrast was injected for MRI of the lumbar spine shortly before this study. Therefore, contrast remains present and all images are effectively postcontrast MRI HEAD FINDINGS Brain: There are multiple (greater than 30) subcentimeter parenchymal enhancing lesions including involvement of the cerebellum and right midbrain tectum. There is mild edema associated with some of the lesions. A minority also demonstrate corresponding susceptibility, which may reflect intralesional hemorrhage or mineralization. There is no acute infarction. No hydrocephalus. No significant mass effect. Vascular: Major vessel flow voids at the skull base are preserved. Skull and upper cervical spine: Probable areas of abnormal marrow enhancement at the skull base and C2 posterior elements Other: Mastoid air cells are clear. MRI ORBITS FINDINGS Orbits: No proptosis. Suspected thin enhancing tissue at the posterior aspect of the left globe. Extraocular muscles are symmetric and unremarkable. There is no abnormal enhancement of the optic nerve sheath complexes. Visualized sinuses: Minor mucosal thickening. Soft tissues: Unremarkable. IMPRESSION: Numerous enhancing parenchymal intracranial lesions likely reflecting metastases. Mild associated edema. No mass effect. Probable areas of  abnormal marrow enhancement reflecting osseous metastases at the skull base and upper cervical spine. Suspected abnormal thin enhancing tissue at the posterior aspect of the left lobe. Electronically Signed   By: Praneil  Patel M.D.   On: 05/10/2019 19:29   MR Lumbar Spine W Wo Contrast  Addendum Date: 05/10/2019   ADDENDUM REPORT: 05/10/2019 17:37 ADDENDUM: Postcontrast images were not reviewed initially. There is enhancement corresponding to STIR hyperintense lesions. Additionally, there is extraosseous disease encroaching on the right S1 foramen. Electronically Signed   By: Praneil  Patel M.D.   On: 05/10/2019 17:37   Result Date: 05/10/2019 CLINICAL DATA:  Low back pain, left radiculopathy EXAM: MRI LUMBAR SPINE WITHOUT AND WITH CONTRAST TECHNIQUE: Multiplanar and multiecho pulse sequences of the lumbar spine were obtained without and with intravenous contrast. CONTRAST:  10mL GADAVIST GADOBUTROL 1 MMOL/ML IV SOLN COMPARISON:  None. FINDINGS: Segmentation:  Standard. Alignment:  Trace retrolisthesis at L3-L4 Vertebrae: Small Schmorl's nodes are present. No compression deformity. There are STIR hyperintense lesions involving all levels as well as the bony pelvis. No evidence   of significant epidural disease extension. Conus medullaris and cauda equina: Conus extends to the L1 level. Conus and cauda equina appear normal. Paraspinal and other soft tissues: Unremarkable. Disc levels: Imaged in the sagittal plane only, there is a small central disc protrusion and annular fissure at T11-T12. L1-L2: Mild facet hypertrophy and prominence of dorsal epidural fat. No significant canal or foraminal stenosis. L2-L3: Mild facet hypertrophy and prominence of dorsal epidural fat. No significant canal or foraminal stenosis. L3-L4: Left subarticular/foraminal disc protrusion. Mild facet arthropathy with ligamentum flavum infolding and mild prominence of dorsal epidural fat. No significant canal stenosis. Effacement of the  left lateral recess with compression of the traversing L4 nerve root. No right foraminal stenosis. Mild left foraminal stenosis. L4-L5: Mild disc bulge. Mild facet arthropathy with ligamentum flavum infolding and mild prominence of dorsal epidural fat. No significant canal or foraminal stenosis. L5-S1: Mild facet arthropathy and prominence of epidural fat. No significant canal or foraminal stenosis. IMPRESSION: Multilevel, multifocal osseous lesions suspicious for metastatic disease. No compression deformity or epidural disease. Disc protrusion at L3-L4 compressing the left L4 nerve root. Electronically Signed: By: Macy Mis M.D. On: 05/10/2019 16:38   CT ABDOMEN PELVIS W CONTRAST  Result Date: 05/10/2019 CLINICAL DATA:  Cancer of unknown primary, worsening headaches, neck and back pain with numerous intracranial lesions worrisome for metastatic disease EXAM: CT CHEST, ABDOMEN, AND PELVIS WITH CONTRAST TECHNIQUE: Multidetector CT imaging of the chest, abdomen and pelvis was performed following the standard protocol during bolus administration of intravenous contrast. CONTRAST:  153m OMNIPAQUE IOHEXOL 300 MG/ML  SOLN COMPARISON:  Numerous comparison radiographs of the chest, most recently 05/06/2019 FINDINGS: CT CHEST FINDINGS Cardiovascular: The aortic root is suboptimally assessed given cardiac pulsation artifact. The aorta is normal caliber without acute luminal or periaortic abnormality. Normal 3 vessel branching of the aortic arch. Proximal great vessels opacified normally. Central pulmonary arteries are normal caliber. No large central filling defects are identified on this non tailored examination of the pulmonary arteries. Normal heart size. No pericardial effusion. Mediastinum/Nodes: Multiple right paratracheal and hilar lymph nodes are present with some low central attenuation which may suggest necrosis, representative nodes include a 12 mm low right paratracheal node (4R, 3/74), 10 mm low right  precarinal node (4R, 2/21), and a a 19 mm right hilar lymph node (10R, 2/44). Lungs/Pleura: In the right lung apex is a 3.0 x 3.0 x 2.8 cm lesion with spiculated margins extending to both the pleural surface and superior right hilum. Additional subsided sub 5 mm nodules are seen in the left lung apex (6/40, 6/56), superior segment left lower lobe (6/59). No consolidation. No convincing features of edema. No pneumothorax or effusion. Musculoskeletal: There are numerous lytic appearing lesions throughout the thoracic spine involving both the vertebral bodies and posterior elements of multiple levels. Notably, a hypoattenuating lesion in the L3 vertebral body demonstrates a superimposed pathologic compression deformity with approximately 30% height loss. No worrisome chest wall lesion. CT ABDOMEN PELVIS FINDINGS Hepatobiliary: There are innumerable peripherally enhancing centrally hypoattenuating lesions seen throughout the liver. The largest lesion is seen within the caudate lobe measuring up to 2.8 cm in size (2/47). An additional 2.4 cm lesion in the left lobe liver (2/52). Additional geographic region of hyperattenuation in the periphery of the right upper lobe, may reflect a transient hepatic attenuation difference though certainly an underlying lesion in this location is not excluded. Gallbladder is unremarkable. No calcified gallstones. No biliary ductal dilatation. Pancreas: Unremarkable. No pancreatic ductal dilatation or  surrounding inflammatory changes. Spleen: Normal in size without focal abnormality. Adrenals/Urinary Tract: Adrenal glands are unremarkable. Kidneys are normal, without renal calculi, focal lesion, or hydronephrosis. Bladder is unremarkable. High attenuation material within the urinary bladder likely related to recent contrast enhanced brain MRI. Stomach/Bowel: Distal esophagus, stomach and duodenal sweep are unremarkable. No small bowel wall thickening or dilatation. A normal appendix is  visualized. No colonic dilatation or wall thickening. No evidence of obstruction. Vascular/Lymphatic: No significant vascular findings are present. No enlarged abdominal or pelvic lymph nodes. Reproductive: The prostate and seminal vesicles are unremarkable. Other: No abdominopelvic free fluid or air. Small fat containing inguinal hernias. No bowel containing hernia. Musculoskeletal: There are numerous lucent lesions within the vertebral bodies and transverse processes of the lumbar spine compatible with osseous metastatic disease. Additional lucent lesions present within both ilia including a lesion in the left iliac wing which demonstrates some soft tissue extension (2/90). IMPRESSION: 1. Thick-walled cavitary lesion in the left lung apex with spiculated margins extending to both the pleural surface and superior right hilum, concerning for primary lung malignancy. Appearance could suggest a squamous cell carcinoma the lung given the location and cavitation though is by no means a diagnostic finding. 2. Multiple additional contralateral sub 5 mm lymph nodes, could be infectious or inflammatory though should be certainly viewed with suspicion and for metastatic disease given other findings throughout the body. 3. Numerous necrotic appearing is lateral mediastinal and right hilar lymph nodes, likely metastatic. 4. Innumerable rim enhancing hepatic lesions throughout both lobes of the liver compatible with solid organ metastasis. Additional geographic region of hyperattenuation in the periphery of the right upper lobe, may reflect a transient hepatic attenuation difference though certainly an underlying lesion in this location is not excluded. Attention on follow-up recommended. 5. There are multiple lytic appearing lesions throughout the thoracic and lumbar spine as well as portions of the bony pelvis. 6. Soft tissue extension of an osseous lesion involving the left iliac wing. 7. Pathologic split type compression  fracture of the T3 vertebral body with approximately 30% height loss. These results were called by telephone at the time of interpretation on 05/10/2019 at 10:05 pm to provider GARRETT GREEN , who verbally acknowledged these results. Electronically Signed   By: Lovena Le M.D.   On: 05/10/2019 22:05   US BIOPSY (LIVER)  Result Date: 05/17/2019 CLINICAL DATA:  Multiple liver lesions worrisome for metastatic disease EXAM: ULTRASOUND-GUIDED CORE LIVER LESION BIOPSY TECHNIQUE: An ultrasound guided liver biopsy was thoroughly discussed with the patient and questions were answered. The benefits, risks, alternatives, and complications were also discussed. The patient understands and wishes to proceed with the procedure. A verbal as well as written consent was obtained. Survey ultrasound of the liver was performed, a representative lesion identified, and an appropriate skin entry site was determined. Skin site was marked, prepped with chlorhexidine, and draped in usual sterile fashion, and infiltrated locally with 1% lidocaine. Intravenous Fentanyl 154mg and Versed 287mwere administered as conscious sedation during continuous monitoring of the patient's level of consciousness and physiological / cardiorespiratory status by the radiology RN, with a total moderate sedation time of 12 minutes. a 1764auge trocar needle was advanced under ultrasound guidance into the liver to the margin of the lesion. 3 coaxial 18gauge core samples were then obtained through the guide needle. The guide needle was removed. Post procedure scans demonstrate no apparent complication. COMPLICATIONS: COMPLICATIONS None immediate FINDINGS: Multiple hypoechoic lesions were identified corresponding to CT findings. Representative core  biopsy samples obtained as above. IMPRESSION: 1. Technically successful ultrasound guided core liver lesion biopsy. Electronically Signed   By: D  Hassell M.D.   On: 05/17/2019 07:14   MR ORBITS W CONTRAST  Result  Date: 05/10/2019 CLINICAL DATA:  Left retinal lesion, headaches EXAM: MRI HEAD AND ORBITS WITHOUT AND WITH CONTRAST TECHNIQUE: Multiplanar, multiecho pulse sequences of the brain and surrounding structures were obtained without and with intravenous contrast. Multiplanar, multiecho pulse sequences of the orbits and surrounding structures were obtained including fat saturation techniques, before and after intravenous contrast administration. CONTRAST:  10mL GADAVIST GADOBUTROL 1 MMOL/ML IV SOLN COMPARISON:  None. FINDINGS: Intravenous contrast was injected for MRI of the lumbar spine shortly before this study. Therefore, contrast remains present and all images are effectively postcontrast MRI HEAD FINDINGS Brain: There are multiple (greater than 30) subcentimeter parenchymal enhancing lesions including involvement of the cerebellum and right midbrain tectum. There is mild edema associated with some of the lesions. A minority also demonstrate corresponding susceptibility, which may reflect intralesional hemorrhage or mineralization. There is no acute infarction. No hydrocephalus. No significant mass effect. Vascular: Major vessel flow voids at the skull base are preserved. Skull and upper cervical spine: Probable areas of abnormal marrow enhancement at the skull base and C2 posterior elements Other: Mastoid air cells are clear. MRI ORBITS FINDINGS Orbits: No proptosis. Suspected thin enhancing tissue at the posterior aspect of the left globe. Extraocular muscles are symmetric and unremarkable. There is no abnormal enhancement of the optic nerve sheath complexes. Visualized sinuses: Minor mucosal thickening. Soft tissues: Unremarkable. IMPRESSION: Numerous enhancing parenchymal intracranial lesions likely reflecting metastases. Mild associated edema. No mass effect. Probable areas of abnormal marrow enhancement reflecting osseous metastases at the skull base and upper cervical spine. Suspected abnormal thin enhancing  tissue at the posterior aspect of the left lobe. Electronically Signed   By: Praneil  Patel M.D.   On: 05/10/2019 19:29       IMPRESSION/PLAN: 1. 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 Dr. Moody discusses the findings and work-up thus far. He recommends consideration of PET whole brain radiotherapy given the number and diffuse nature of his brain disease. He reviews the rationale for this form of therapy, as well as recommends palliative radiotherapy therapy to his T3 vertebral body after personally reviewing his studies, there is concern for posterior vertebral body involvement that could lead to compromise of his cord, this is similar at T9-11, which he would also favor treating as a separate isocenter. Also in reviewing his imaging, his symptoms of left thigh pain appear to be most consistent with his S1 disease, and possibly his pelvic disease specifically the left iliac wing. He recommends treating for separate isocenters including the brain, T3, T9-11, and left pelvis including the sacral region and iliac region on the left. We discussed the risks, benefits, short and long-term effects of radiotherapy. Dr. Moody recommends these treatments over 2 weeks, unfortunately the brain portion of his treatment would not be able to be administered in the midst of cytotoxic chemotherapy, so we have discussed with Dr. Dorsey who is in agreement that we can postpone systemic therapy until completion of these radiation fractions. Unfortunately the patient is headed out of town this weekend so he cannot come in until Monday to proceed with simulation however he will come Monday and we will begin next Wednesday, 05/31/2019 with palliative radiotherapy. When he comes Monday, 05/29/2019 he will   sign written consent to proceed. 2. Risks of cognitive deficits from radiotherapy to the brain. We spent time discussing the protocol for small  cell patients who receive whole brain radiotherapy, with Namenda, after discussing the administration of this, the patient declines a prescription, but if he changes his mind will let us know. 3. Social needs. The patient is understandably under an incredible amount of stress, he is very young, has multiple responsibilities for his family, and we will reach out to social work to make sure they are keenly aware of his situation to offer additional resources if needed.  Given current concerns for patient exposure during the COVID-19 pandemic, this encounter was conducted via telephone.  The patient has provided two factor identification and has given verbal consent for this type of encounter and has been advised to only accept a meeting of this type in a secure network environment. The time spent during this encounter was 60 minutes including preparation, discussion, and coordination of the patient's care. The attendants for this meeting include Blenda Nicely, RN, Dr. Lisbeth Renshaw, Hayden Pedro  and Domonik Isidoro Ali and his wife Sesar Ali.  During the encounter,  Blenda Nicely, RN, Dr. Lisbeth Renshaw, and Hayden Pedro were located at [facility] Radiation Oncology Department.  William Ali and his wife Cindra Eves were driving in the car during our conversation.    The above documentation reflects my direct findings during this shared patient visit. Please see the separate note by Dr. Lisbeth Renshaw on this date for the remainder of the patient's plan of care.    Carola Rhine, PAC

## 2019-05-29 ENCOUNTER — Ambulatory Visit: Payer: PRIVATE HEALTH INSURANCE | Admitting: Radiation Oncology

## 2019-05-30 ENCOUNTER — Ambulatory Visit
Admission: RE | Admit: 2019-05-30 | Discharge: 2019-05-30 | Disposition: A | Discharge status: Home / Self Care | Payer: PRIVATE HEALTH INSURANCE | Source: Ambulatory Visit | Attending: Radiation Oncology | Admitting: Radiation Oncology

## 2019-05-30 ENCOUNTER — Other Ambulatory Visit: Payer: Self-pay

## 2019-05-30 ENCOUNTER — Other Ambulatory Visit: Payer: Self-pay | Admitting: Hematology and Oncology

## 2019-05-30 DIAGNOSIS — C787 Secondary malignant neoplasm of liver and intrahepatic bile duct: Secondary | ICD-10-CM | POA: Diagnosis not present

## 2019-05-30 DIAGNOSIS — C7931 Secondary malignant neoplasm of brain: Secondary | ICD-10-CM | POA: Insufficient documentation

## 2019-05-30 DIAGNOSIS — C7951 Secondary malignant neoplasm of bone: Secondary | ICD-10-CM | POA: Insufficient documentation

## 2019-05-30 DIAGNOSIS — Z510 Encounter for antineoplastic radiation therapy: Secondary | ICD-10-CM | POA: Diagnosis not present

## 2019-05-30 DIAGNOSIS — C3491 Malignant neoplasm of unspecified part of right bronchus or lung: Secondary | ICD-10-CM | POA: Diagnosis not present

## 2019-05-30 MED ORDER — ONDANSETRON HCL 8 MG PO TABS: 8.0000 mg | ORAL_TABLET | Freq: Three times a day (TID) | ORAL | 0 refills | Status: DC | PRN

## 2019-05-30 NOTE — Progress Notes (Signed)
I consented the patient and he is still having headaches that have become more frequent and constant since dropping his steroids from 8 mg to 4 mg. He is also having nausea that has become more noticeable in the last week or so. He does not have any antiemetics. I let him know that I would reach out to [name] but recommended he continue the 4 mg of dexamethasone and that I would send in an rx for Zofran. We reviewed the side effect profile and he is in agreement. He again declines Namenda during treatment.    Carola Rhine, PAC

## 2019-05-31 ENCOUNTER — Ambulatory Visit
Admission: RE | Admit: 2019-05-31 | Discharge: 2019-05-31 | Disposition: A | Discharge status: Home / Self Care | Payer: PRIVATE HEALTH INSURANCE | Source: Ambulatory Visit | Attending: Radiation Oncology | Admitting: Radiation Oncology

## 2019-05-31 ENCOUNTER — Other Ambulatory Visit: Payer: Self-pay | Admitting: Radiology

## 2019-05-31 ENCOUNTER — Other Ambulatory Visit: Payer: Self-pay

## 2019-05-31 DIAGNOSIS — Z510 Encounter for antineoplastic radiation therapy: Secondary | ICD-10-CM | POA: Diagnosis not present

## 2019-06-01 ENCOUNTER — Ambulatory Visit
Admission: RE | Admit: 2019-06-01 | Discharge: 2019-06-01 | Disposition: A | Discharge status: Home / Self Care | Payer: PRIVATE HEALTH INSURANCE | Source: Ambulatory Visit | Attending: Radiation Oncology | Admitting: Radiation Oncology

## 2019-06-01 ENCOUNTER — Encounter (HOSPITAL_COMMUNITY): Payer: Self-pay | Admitting: Hematology and Oncology

## 2019-06-01 ENCOUNTER — Other Ambulatory Visit (HOSPITAL_COMMUNITY): Payer: Medicaid Other

## 2019-06-01 ENCOUNTER — Ambulatory Visit (HOSPITAL_COMMUNITY): Payer: Medicaid Other

## 2019-06-01 DIAGNOSIS — Z510 Encounter for antineoplastic radiation therapy: Secondary | ICD-10-CM | POA: Diagnosis not present

## 2019-06-02 ENCOUNTER — Emergency Department (HOSPITAL_COMMUNITY): Payer: PRIVATE HEALTH INSURANCE

## 2019-06-02 ENCOUNTER — Emergency Department (HOSPITAL_COMMUNITY)
Admission: EM | Admit: 2019-06-02 | Discharge: 2019-06-02 | Disposition: A | Discharge status: Home / Self Care | Payer: PRIVATE HEALTH INSURANCE | Attending: Emergency Medicine | Admitting: Emergency Medicine

## 2019-06-02 ENCOUNTER — Other Ambulatory Visit: Payer: Medicaid Other

## 2019-06-02 ENCOUNTER — Ambulatory Visit: Payer: Medicaid Other

## 2019-06-02 ENCOUNTER — Encounter (HOSPITAL_COMMUNITY): Payer: Self-pay

## 2019-06-02 ENCOUNTER — Other Ambulatory Visit: Payer: Self-pay

## 2019-06-02 ENCOUNTER — Ambulatory Visit: Admission: RE | Admit: 2019-06-02 | Payer: PRIVATE HEALTH INSURANCE | Source: Ambulatory Visit

## 2019-06-02 ENCOUNTER — Ambulatory Visit: Payer: Medicaid Other | Admitting: Hematology and Oncology

## 2019-06-02 DIAGNOSIS — Z79899 Other long term (current) drug therapy: Secondary | ICD-10-CM | POA: Diagnosis not present

## 2019-06-02 DIAGNOSIS — R079 Chest pain, unspecified: Secondary | ICD-10-CM | POA: Diagnosis present

## 2019-06-02 DIAGNOSIS — R5383 Other fatigue: Secondary | ICD-10-CM | POA: Diagnosis not present

## 2019-06-02 DIAGNOSIS — E860 Dehydration: Secondary | ICD-10-CM | POA: Diagnosis not present

## 2019-06-02 DIAGNOSIS — I10 Essential (primary) hypertension: Secondary | ICD-10-CM | POA: Diagnosis not present

## 2019-06-02 DIAGNOSIS — C7989 Secondary malignant neoplasm of other specified sites: Secondary | ICD-10-CM | POA: Diagnosis not present

## 2019-06-02 LAB — CBC WITH DIFFERENTIAL/PLATELET
Abs Immature Granulocytes: 0.19 10*3/uL — ABNORMAL HIGH (ref 0.00–0.07)
Basophils Absolute: 0 10*3/uL (ref 0.0–0.1)
Basophils Relative: 0 %
Eosinophils Absolute: 0 10*3/uL (ref 0.0–0.5)
Eosinophils Relative: 0 %
HCT: 46.8 % (ref 39.0–52.0)
Hemoglobin: 14.9 g/dL (ref 13.0–17.0)
Immature Granulocytes: 1 %
Lymphocytes Relative: 4 %
Lymphs Abs: 0.7 10*3/uL (ref 0.7–4.0)
MCH: 26.5 pg (ref 26.0–34.0)
MCHC: 31.8 g/dL (ref 30.0–36.0)
MCV: 83.3 fL (ref 80.0–100.0)
Monocytes Absolute: 0.9 10*3/uL (ref 0.1–1.0)
Monocytes Relative: 5 %
Neutro Abs: 17.5 10*3/uL — ABNORMAL HIGH (ref 1.7–7.7)
Neutrophils Relative %: 90 %
Platelets: 148 10*3/uL — ABNORMAL LOW (ref 150–400)
RBC: 5.62 MIL/uL (ref 4.22–5.81)
RDW: 15.9 % — ABNORMAL HIGH (ref 11.5–15.5)
WBC: 19.4 10*3/uL — ABNORMAL HIGH (ref 4.0–10.5)
nRBC: 0 % (ref 0.0–0.2)

## 2019-06-02 LAB — URINALYSIS, ROUTINE W REFLEX MICROSCOPIC
Bacteria, UA: NONE SEEN
Bilirubin Urine: NEGATIVE
Glucose, UA: NEGATIVE mg/dL
Ketones, ur: NEGATIVE mg/dL
Leukocytes,Ua: NEGATIVE
Nitrite: NEGATIVE
Protein, ur: NEGATIVE mg/dL
Specific Gravity, Urine: 1.016 (ref 1.005–1.030)
pH: 5 (ref 5.0–8.0)

## 2019-06-02 LAB — COMPREHENSIVE METABOLIC PANEL
ALT: 179 U/L — ABNORMAL HIGH (ref 0–44)
AST: 67 U/L — ABNORMAL HIGH (ref 15–41)
Albumin: 3.7 g/dL (ref 3.5–5.0)
Alkaline Phosphatase: 500 U/L — ABNORMAL HIGH (ref 38–126)
Anion gap: 14 (ref 5–15)
BUN: 51 mg/dL — ABNORMAL HIGH (ref 6–20)
CO2: 22 mmol/L (ref 22–32)
Calcium: 9.3 mg/dL (ref 8.9–10.3)
Chloride: 101 mmol/L (ref 98–111)
Creatinine, Ser: 1.55 mg/dL — ABNORMAL HIGH (ref 0.61–1.24)
GFR calc Af Amer: >60 mL/min (ref >60)
GFR calc non Af Amer: 58 mL/min — ABNORMAL LOW (ref >60)
Glucose, Bld: 104 mg/dL — ABNORMAL HIGH (ref 70–99)
Potassium: 5.1 mmol/L (ref 3.5–5.1)
Sodium: 137 mmol/L (ref 135–145)
Total Bilirubin: 1.1 mg/dL (ref 0.3–1.2)
Total Protein: 7.4 g/dL (ref 6.5–8.1)

## 2019-06-02 LAB — MAGNESIUM: Magnesium: 2.5 mg/dL — ABNORMAL HIGH (ref 1.7–2.4)

## 2019-06-02 LAB — LIPASE, BLOOD: Lipase: 32 U/L (ref 11–51)

## 2019-06-02 IMAGING — DX DG CHEST 1V PORT
1 series · 1 of 1 positions shown · non-contrast
Comparison: [DATE]

CLINICAL DATA: Sharp right-sided chest pain

EXAM:
PORTABLE CHEST 1 VIEW

[chest ap]
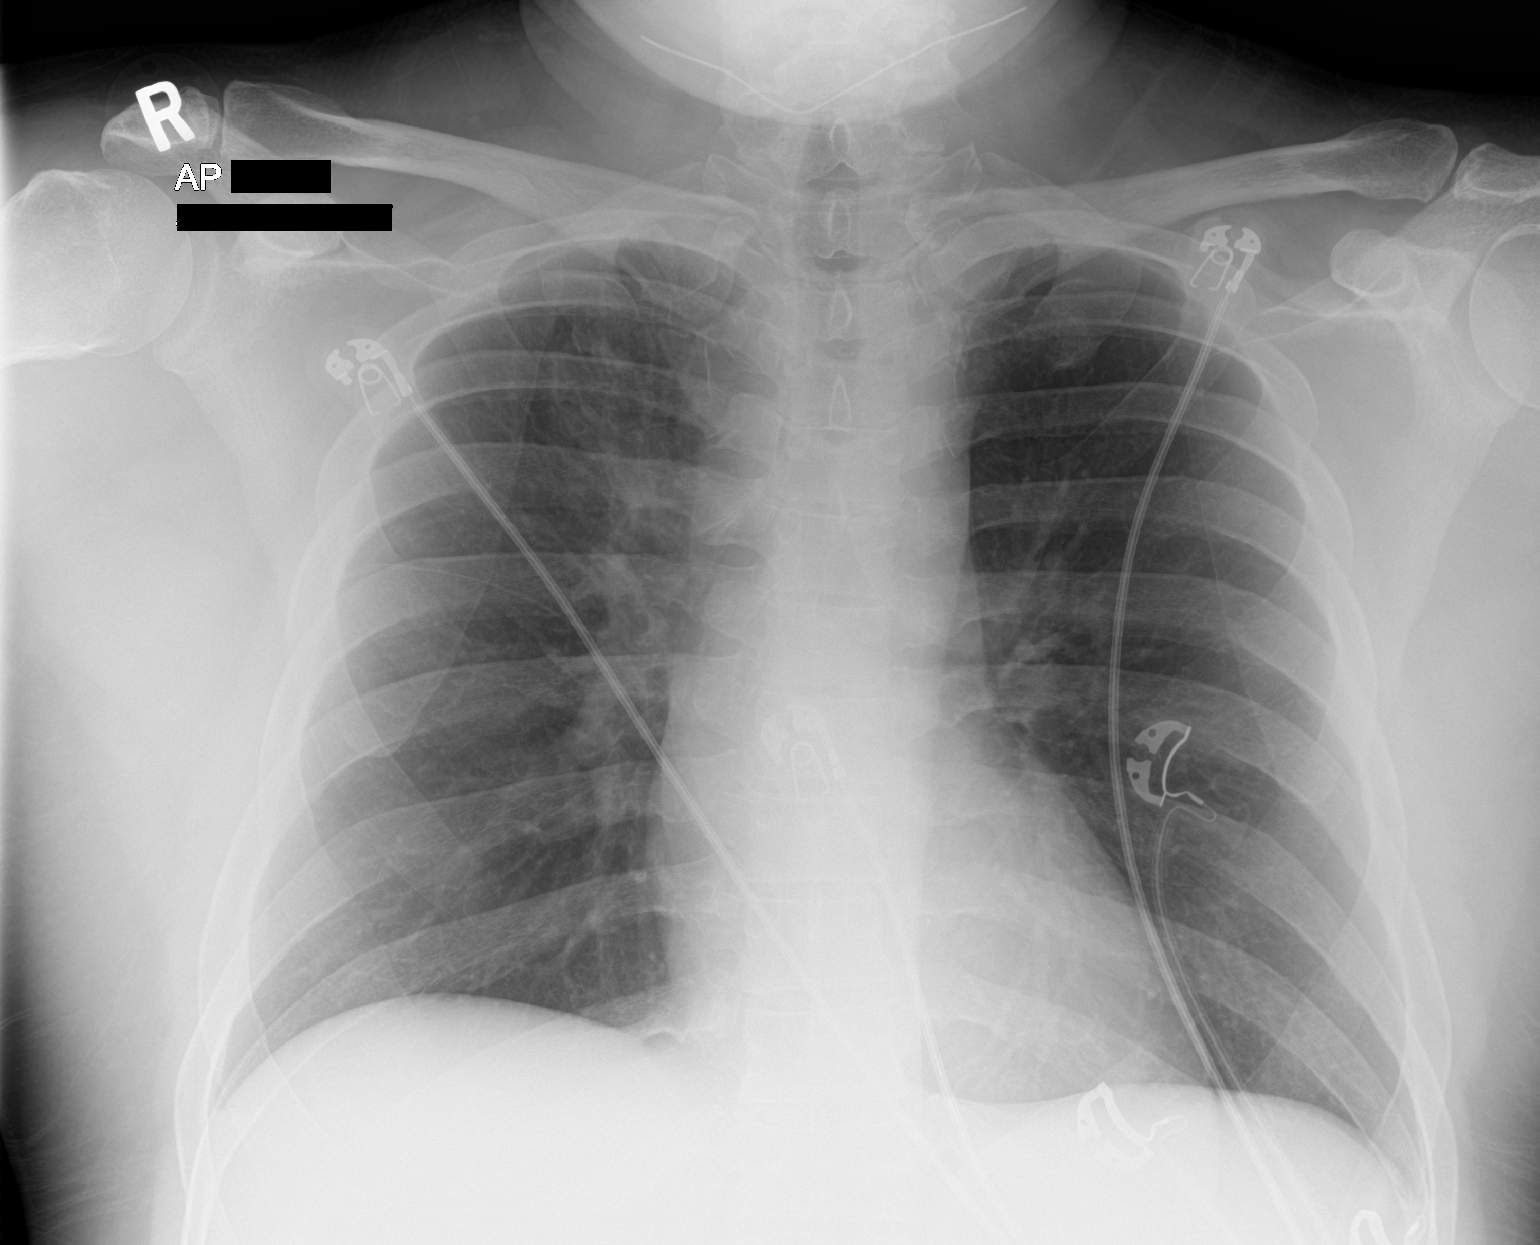

[1 of 1 positions shown; findings below may reference images not displayed]

FINDINGS: The previously demonstrated right upper lobe pulmonary nodule is
better visualized on the patient's prior CT chest and prior x-rays.
There is no pneumothorax. No significant pleural effusion. No
evidence for an acute osseous abnormality. The heart size is normal.
There is some mild thickening of the right paratracheal stripe which
may be secondary to underlying mediastinal adenopathy.
IMPRESSION: 1. Mild thickening of the right paratracheal stripe which may be
secondary to underlying mediastinal adenopathy.
2. The previously demonstrated right upper lobe pulmonary nodule is
better visualized on the patient's prior CT chest and prior x-rays.
3. No acute cardiopulmonary process.

## 2019-06-02 MED ORDER — HYDROMORPHONE HCL 1 MG/ML IJ SOLN
1.0000 mg | Freq: Once | INTRAMUSCULAR | Status: AC
  Administered 2019-06-02: 1 mg via INTRAVENOUS
  Filled 2019-06-02 (×2): qty 1

## 2019-06-02 MED ORDER — FAMOTIDINE IN NACL 20-0.9 MG/50ML-% IV SOLN
20.0000 mg | Freq: Once | INTRAVENOUS | Status: AC
  Administered 2019-06-02: 18:00:00 20 mg via INTRAVENOUS
  Filled 2019-06-02: qty 50

## 2019-06-02 MED ORDER — SODIUM CHLORIDE 0.9 % IV BOLUS
1000.0000 mL | Freq: Once | INTRAVENOUS | Status: AC
  Administered 2019-06-02: 1000 mL via INTRAVENOUS

## 2019-06-02 MED ORDER — LACTATED RINGERS IV BOLUS
1000.0000 mL | Freq: Once | INTRAVENOUS | Status: AC
  Administered 2019-06-02: 18:00:00 1000 mL via INTRAVENOUS

## 2019-06-02 NOTE — ED Notes (Signed)
Due to a syringe malfunction this RN had to waste the first dose of Dilaudid with Cabin crew.

## 2019-06-02 NOTE — ED Provider Notes (Signed)
[facility] DEPT Provider Note   CSN: [id number] Arrival date & time: 06/02/19  1636     History Chief Complaint  Patient presents with  . Chest Pain    William Ali is a 34 y.o. male.  HPI   34 year old male with several complaints.  Unfortunately, he was recently diagnosed with metastatic adenocarcinoma, lung versus GI.  Today he was going to oncology appointment when he can feeling worse than he has been.  Extremely fatigued.  Some pain in his upper abdomen into his chest.  He has been having some pain in his upper abdomen but the chest pain is a new symptom.  No acute shortness of breath.  No cough.  Has not felt like he has had a fever.  He states that his mouth constantly feels dry and is requesting something to drink.  Denies polyuria.  He is on dexamethasone for symptoms related to brain metastases.  He is on a PPI.  Past Medical History:  Diagnosis Date  . Back pain   . Hypertension     Patient Active Problem List   Diagnosis Date Noted  . Goals of care, counseling/discussion 05/21/2019  . 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 Adenocarcinoma of lung ([place]) 05/21/2019   Past Surgical History:  Procedure Laterality Date  . KNEE SURGERY       Family History  Problem Relation Age of Onset  . Hypertension Mother   . Diabetes Father    Social History   Tobacco Use  . Smoking status: Never Smoker  . Smokeless tobacco: Never Used  Substance Use Topics  . Alcohol use: No  . Drug use: No   Home Medications Prior to Admission medications   Medication Sig Start Date End Date Taking? Authorizing Provider  albuterol (VENTOLIN HFA) 108 (90 Base) MCG/ACT inhaler Inhale 2 puffs into the lungs every 6 (six) hours as needed for wheezing or shortness of breath. 05/19/19   Orson Slick, MD  benzonatate (TESSALON) 100 MG capsule Take 1 capsule (100 mg total) by mouth every 8 (eight) hours. Patient not taking: Reported on  05/25/2019 05/05/19   Darr, Marguerita Beards, PA-C  cetirizine (ZYRTEC) 10 MG tablet Take 1 tablet (10 mg total) by mouth daily. 11/23/18   Zigmund Gottron, NP  dexamethasone (DECADRON) 4 MG tablet Take 2 tablets (8 mg total) by mouth daily. 05/19/19   Orson Slick, MD  hydrochlorothiazide (HYDRODIURIL) 25 MG tablet Take 1 tablet (25 mg total) by mouth daily. 07/21/18   Lamptey, Myrene Galas, MD  HYDROcodone-acetaminophen (NORCO/VICODIN) 5-325 MG tablet Take 1 tablet by mouth every 4 (four) hours as needed for up to 10 doses. Patient not taking: Reported on 05/23/2019 05/10/19   Corena Herter, PA-C  LORazepam (ATIVAN) 0.5 MG tablet 1 tab po q 4-6 hours prn or 1 tab po 30 minutes prior to radiation 05/25/19   Hayden Pedro, PA-C  ondansetron (ZOFRAN) 8 MG tablet Take 1 tablet (8 mg total) by mouth every 8 (eight) hours as needed for nausea or vomiting. 05/30/19   Hayden Pedro, PA-C  oxyCODONE (OXY IR/ROXICODONE) 5 MG immediate release tablet Take 1-2 tablets (5-10 mg total) by mouth every 6 (six) hours as needed for moderate pain or severe pain. 05/19/19   Orson Slick, MD  pantoprazole (PROTONIX) 40 MG tablet Take 40 mg by mouth daily. 04/11/19   [provider]  sodium chloride (OCEAN) 0.65 % SOLN nasal  spray Place 1 spray into both nostrils as needed for congestion. Patient not taking: Reported on 05/25/2019 05/05/19   Darr, Marguerita Beards, PA-C  traMADol (ULTRAM) 50 MG tablet Take 1 tablet (50 mg total) by mouth every 6 (six) hours as needed. Patient not taking: Reported on 05/25/2019 05/06/19   Milton Ferguson, MD  amLODipine (NORVASC) 5 MG tablet Take 1 tablet (5 mg total) by mouth daily. 04/21/18 07/21/18  Robyn Haber, MD  famotidine (PEPCID) 20 MG tablet Take 1 tablet (20 mg total) by mouth 2 (two) times daily. Patient taking differently: Take 20 mg by mouth 2 (two) times daily as needed for heartburn.  04/21/18 07/21/18  Robyn Haber, MD   Allergies    Onion, Other, Peanut oil,  and Peanut-containing drug products  Review of Systems   Review of Systems All systems reviewed and negative, other than as noted in HPI.  Physical Exam Updated Vital Signs BP (!) 151/87   Pulse 96   Temp 98.3 F (36.8 C) (Oral)   Resp (!) 21   Ht 5' 7"  (1.702 m)   Wt 93.9 kg   SpO2 99%   BMI 32.42 kg/m   Physical Exam Vitals and nursing note reviewed.  Constitutional:      General: He is not in acute distress.    Appearance: He is well-developed. He is obese.     Comments: Laying in bed with the lights off.  Appears tired, but not toxic.  HENT:     Head: Normocephalic and atraumatic.  Eyes:     General:        Right eye: No discharge.        Left eye: No discharge.     Conjunctiva/sclera: Conjunctivae normal.  Cardiovascular:     Rate and Rhythm: Normal rate and regular rhythm.     Heart sounds: Normal heart sounds. No murmur. No friction rub. No gallop.   Pulmonary:     Effort: Pulmonary effort is normal. No respiratory distress.     Breath sounds: Normal breath sounds.  Abdominal:     General: There is no distension.     Palpations: Abdomen is soft.     Tenderness: There is abdominal tenderness.     Comments: Mild diffuse tenderness, somewhat worse in epigastrium and right upper quadrant.  No rebound or guarding.  Does not seem distended.  Musculoskeletal:        General: No tenderness.     Cervical back: Neck supple.  Skin:    General: Skin is warm and dry.  Neurological:     Mental Status: He is alert.  Psychiatric:        Behavior: Behavior normal.        Thought Content: Thought content normal.    ED Results / Procedures / Treatments   Labs (all labs ordered are listed, but only abnormal results are displayed) Labs Reviewed  CBC WITH DIFFERENTIAL/PLATELET - Abnormal; Notable for the following components:      Result Value   WBC 19.4 (*)    RDW 15.9 (*)    Platelets 148 (*)    Neutro Abs 17.5 (*)    Abs Immature Granulocytes 0.19 (*)    All  other components within normal limits  COMPREHENSIVE METABOLIC PANEL - Abnormal; Notable for the following components:   Glucose, Bld 104 (*)    BUN 51 (*)    Creatinine, Ser 1.55 (*)    AST 67 (*)    ALT 179 (*)  Alkaline Phosphatase 500 (*)    GFR calc non Af Amer 58 (*)    All other components within normal limits  MAGNESIUM - Abnormal; Notable for the following components:   Magnesium 2.5 (*)    All other components within normal limits  LIPASE, BLOOD  URINALYSIS, ROUTINE W REFLEX MICROSCOPIC    EKG EKG Interpretation  Date/Time:  Friday Jun 02 2019 16:38:11 EDT Ventricular Rate:  98 PR Interval:    QRS Duration: 77 QT Interval:  313 QTC Calculation: 400 R Axis:   34 Text Interpretation: Sinus rhythm Consider right atrial enlargement ST elev, probable normal early repol pattern No significant change since last tracing Confirmed by Virgel Manifold [phone]) on 06/02/2019 8:22:14 PM   Radiology DG Chest Portable 1 View  Result Date: 06/02/2019 CLINICAL DATA:  Hervey Ard right-sided chest pain EXAM: PORTABLE CHEST 1 VIEW COMPARISON:  May 06, 2019 FINDINGS: The previously demonstrated right upper lobe pulmonary nodule is better visualized on the patient's prior CT chest and prior x-rays. There is no pneumothorax. No significant pleural effusion. No evidence for an acute osseous abnormality. The heart size is normal. There is some mild thickening of the right paratracheal stripe which may be secondary to underlying mediastinal adenopathy. IMPRESSION: 1. Mild thickening of the right paratracheal stripe which may be secondary to underlying mediastinal adenopathy. 2. The previously demonstrated right upper lobe pulmonary nodule is better visualized on the patient's prior CT chest and prior x-rays. 3. No acute cardiopulmonary process. Electronically Signed   By: Constance Holster M.D.   On: 06/02/2019 17:55    Procedures Procedures (including critical care time)  Medications Ordered in  ED Medications  lactated ringers bolus 1,000 mL (has no administration in time range)  famotidine (PEPCID) IVPB 20 mg premix (has no administration in time range)    ED Course  I have reviewed the triage vital signs and the nursing notes.  Pertinent labs & imaging results that were available during my care of the patient were reviewed by me and considered in my medical decision making (see chart for details).    MDM Rules/Calculators/A&P  34 year old male with somewhat vague complaints of feeling generally weak and unwell.  Also some upper abdominal/sternal pain.  Suspect the some of his symptoms are related to his underlying malignancy.  Pain and upper abdomen could be related to hepatic involvement.  He has some tenderness there but has a nonsurgical abdomen.  He has been on dexamethasone for almost a couple weeks at this point.  Some of his symptoms may also be related to steroid usage.  Chest pain sounds atypical for ACS.  Will check an EKG and chest x-ray.  Is afebrile.  O2 sats are normal on room air.  BUN in the 50s.  Complaining of dry mouth/thirsty. Will hydrate with IVF. Glucose ok.  Abnormal LFTs.  Known hepatic involvement.  Alk phos could be related to this as well versus bony mets.  He has a leukocytosis but has been on dexamethasone.  Is afebrile.  No overt infectious symptoms.  Final Clinical Impression(s) / ED Diagnoses Final diagnoses:  Other fatigue  Dehydration  Malignant neoplasm metastatic to other site [facility])    Rx / DC Orders ED Discharge Orders    None       Virgel Manifold, MD 06/05/19 1146

## 2019-06-02 NOTE — ED Triage Notes (Signed)
Pt brought over from cancer center. Pt endorses sharp right sided chest pain starting before arriving to the cancer center that radiates to abdomen and back. Pt diagnosed with stage 4 lung cancer about a week and half ago. Pt received radiation treatment yesterday. Pt also reports mild SHOB.

## 2019-06-03 ENCOUNTER — Other Ambulatory Visit: Payer: Self-pay | Admitting: Hematology and Oncology

## 2019-06-04 ENCOUNTER — Encounter (HOSPITAL_COMMUNITY): Payer: Self-pay | Admitting: Emergency Medicine

## 2019-06-04 ENCOUNTER — Other Ambulatory Visit: Payer: Self-pay

## 2019-06-04 ENCOUNTER — Emergency Department (HOSPITAL_COMMUNITY): Payer: PRIVATE HEALTH INSURANCE

## 2019-06-04 ENCOUNTER — Emergency Department (HOSPITAL_BASED_OUTPATIENT_CLINIC_OR_DEPARTMENT_OTHER): Payer: PRIVATE HEALTH INSURANCE

## 2019-06-04 ENCOUNTER — Emergency Department (HOSPITAL_COMMUNITY)
Admission: EM | Admit: 2019-06-04 | Discharge: 2019-06-04 | Disposition: A | Discharge status: Home / Self Care | Payer: PRIVATE HEALTH INSURANCE | Attending: Emergency Medicine | Admitting: Emergency Medicine

## 2019-06-04 DIAGNOSIS — C7951 Secondary malignant neoplasm of bone: Secondary | ICD-10-CM | POA: Diagnosis not present

## 2019-06-04 DIAGNOSIS — R519 Headache, unspecified: Secondary | ICD-10-CM | POA: Diagnosis not present

## 2019-06-04 DIAGNOSIS — I82432 Acute embolism and thrombosis of left popliteal vein: Secondary | ICD-10-CM

## 2019-06-04 DIAGNOSIS — C7931 Secondary malignant neoplasm of brain: Secondary | ICD-10-CM | POA: Diagnosis not present

## 2019-06-04 DIAGNOSIS — C7800 Secondary malignant neoplasm of unspecified lung: Secondary | ICD-10-CM | POA: Diagnosis not present

## 2019-06-04 DIAGNOSIS — Z79899 Other long term (current) drug therapy: Secondary | ICD-10-CM | POA: Insufficient documentation

## 2019-06-04 DIAGNOSIS — M79605 Pain in left leg: Secondary | ICD-10-CM | POA: Diagnosis present

## 2019-06-04 DIAGNOSIS — G893 Neoplasm related pain (acute) (chronic): Secondary | ICD-10-CM | POA: Diagnosis not present

## 2019-06-04 DIAGNOSIS — Z91010 Allergy to peanuts: Secondary | ICD-10-CM | POA: Diagnosis not present

## 2019-06-04 DIAGNOSIS — I10 Essential (primary) hypertension: Secondary | ICD-10-CM | POA: Diagnosis not present

## 2019-06-04 DIAGNOSIS — M7989 Other specified soft tissue disorders: Secondary | ICD-10-CM

## 2019-06-04 HISTORY — DX: Malignant (primary) neoplasm, unspecified: C80.1

## 2019-06-04 LAB — COMPREHENSIVE METABOLIC PANEL
ALT: 190 U/L — ABNORMAL HIGH (ref 0–44)
AST: 71 U/L — ABNORMAL HIGH (ref 15–41)
Albumin: 3.7 g/dL (ref 3.5–5.0)
Alkaline Phosphatase: 514 U/L — ABNORMAL HIGH (ref 38–126)
Anion gap: 14 (ref 5–15)
BUN: 39 mg/dL — ABNORMAL HIGH (ref 6–20)
CO2: 23 mmol/L (ref 22–32)
Calcium: 9.3 mg/dL (ref 8.9–10.3)
Chloride: 98 mmol/L (ref 98–111)
Creatinine, Ser: 1.47 mg/dL — ABNORMAL HIGH (ref 0.61–1.24)
GFR calc Af Amer: >60 mL/min (ref >60)
GFR calc non Af Amer: >60 mL/min (ref >60)
Glucose, Bld: 92 mg/dL (ref 70–99)
Potassium: 5.2 mmol/L — ABNORMAL HIGH (ref 3.5–5.1)
Sodium: 135 mmol/L (ref 135–145)
Total Bilirubin: 1.4 mg/dL — ABNORMAL HIGH (ref 0.3–1.2)
Total Protein: 7.7 g/dL (ref 6.5–8.1)

## 2019-06-04 LAB — CBC WITH DIFFERENTIAL/PLATELET
Abs Immature Granulocytes: 0.13 10*3/uL — ABNORMAL HIGH (ref 0.00–0.07)
Basophils Absolute: 0 10*3/uL (ref 0.0–0.1)
Basophils Relative: 0 %
Eosinophils Absolute: 0.1 10*3/uL (ref 0.0–0.5)
Eosinophils Relative: 1 %
HCT: 45.7 % (ref 39.0–52.0)
Hemoglobin: 14.6 g/dL (ref 13.0–17.0)
Immature Granulocytes: 1 %
Lymphocytes Relative: 7 %
Lymphs Abs: 1 10*3/uL (ref 0.7–4.0)
MCH: 27.4 pg (ref 26.0–34.0)
MCHC: 31.9 g/dL (ref 30.0–36.0)
MCV: 85.7 fL (ref 80.0–100.0)
Monocytes Absolute: 0.6 10*3/uL (ref 0.1–1.0)
Monocytes Relative: 4 %
Neutro Abs: 12.1 10*3/uL — ABNORMAL HIGH (ref 1.7–7.7)
Neutrophils Relative %: 87 %
Platelets: 114 10*3/uL — ABNORMAL LOW (ref 150–400)
RBC: 5.33 MIL/uL (ref 4.22–5.81)
RDW: 16.3 % — ABNORMAL HIGH (ref 11.5–15.5)
WBC: 14 10*3/uL — ABNORMAL HIGH (ref 4.0–10.5)
nRBC: 0 % (ref 0.0–0.2)

## 2019-06-04 IMAGING — CR DG FEMUR 2+V*L*
4 series · 4 of 4 positions shown · non-contrast
Comparison: None.

CLINICAL DATA: Lower extremity pain.

EXAM:
LEFT FEMUR 2 VIEWS

[t femur distal lat left]
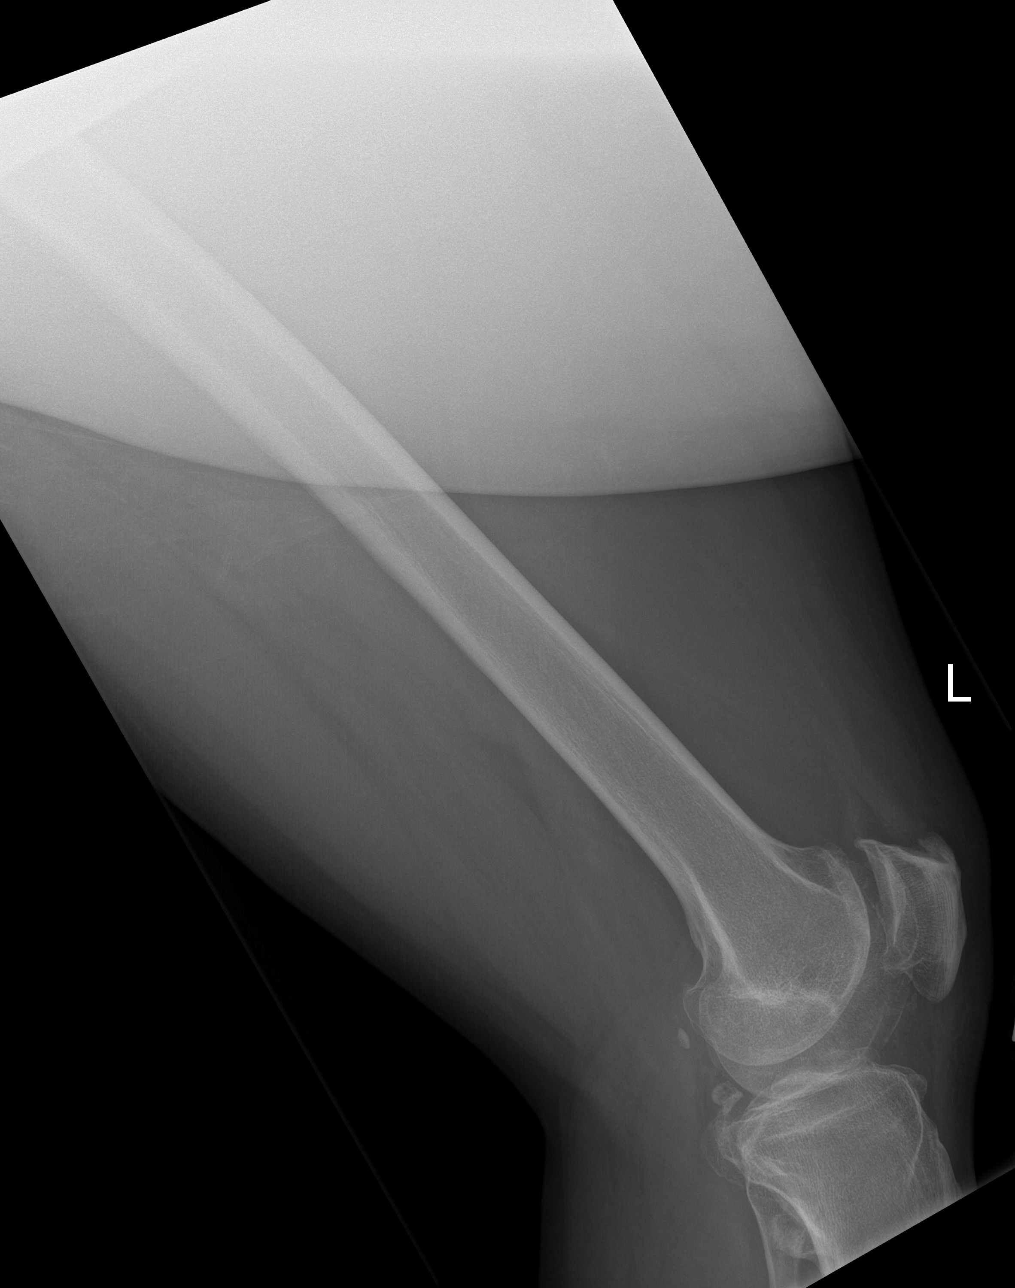

[t femur proximal lat left]
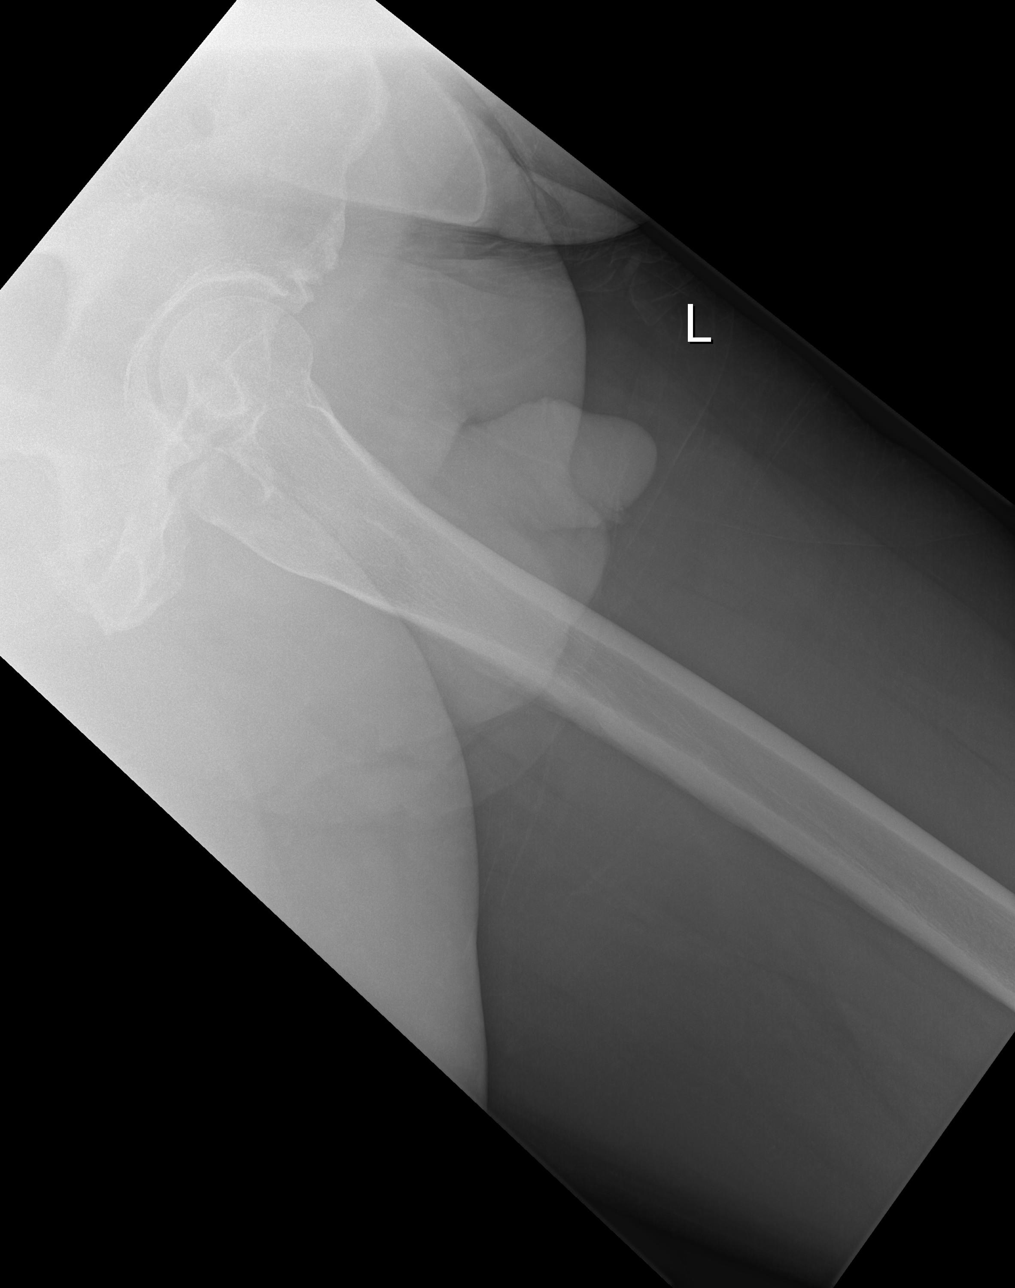

[t femur proximal ap left]
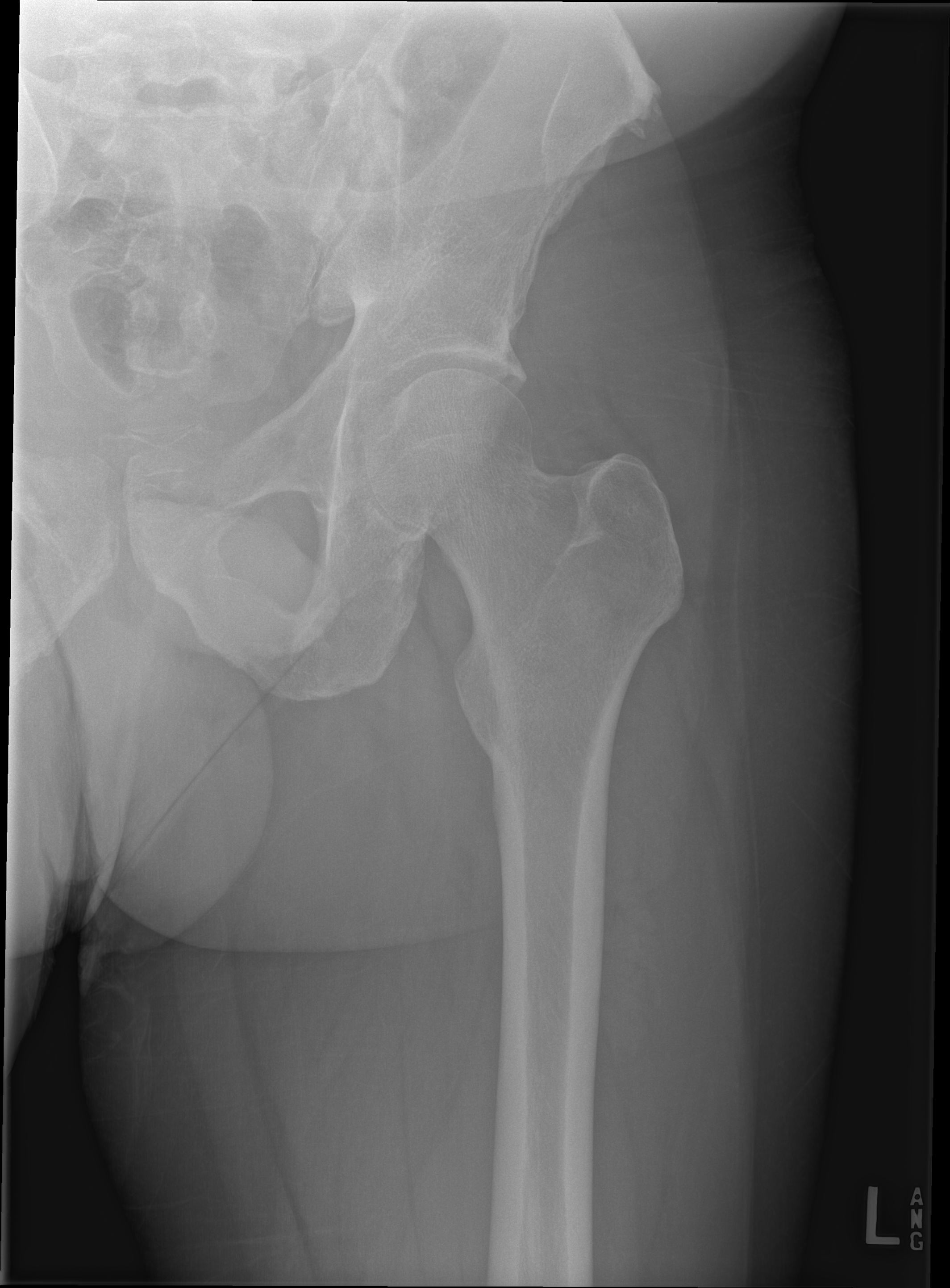

[t femur distal ap left]
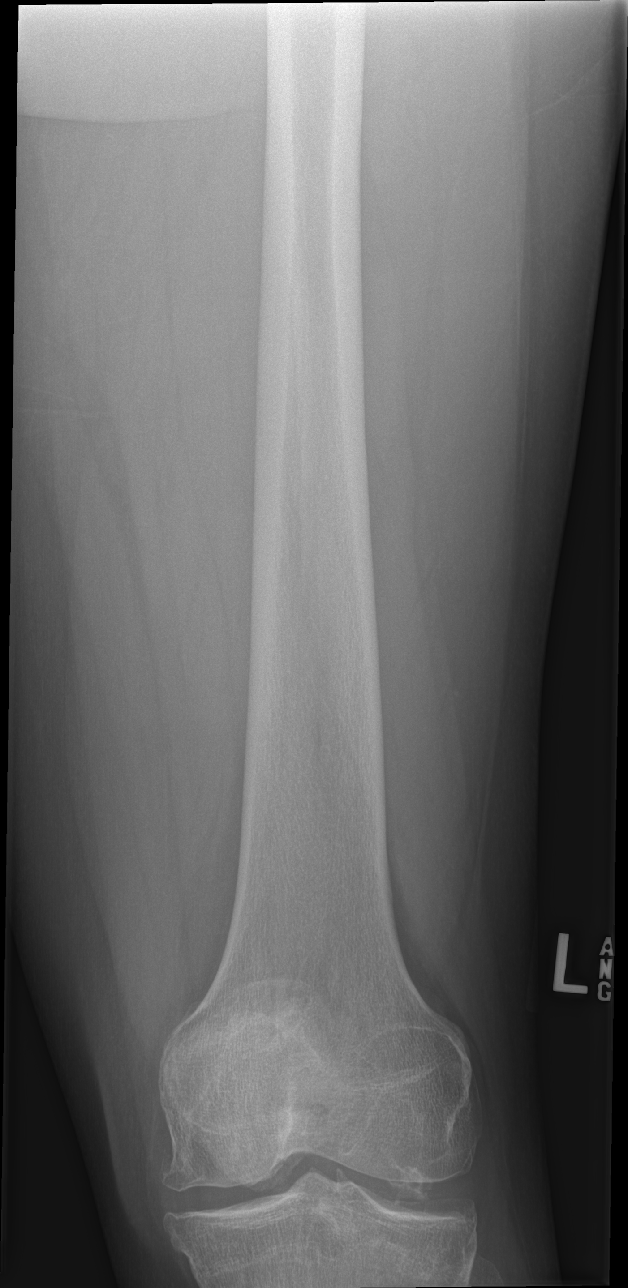

[4 of 4 positions shown; findings below may reference images not displayed]

FINDINGS: Frontal and lateral views were obtained. No fracture or dislocation.
No abnormal periosteal reaction. No blastic or lytic bone lesions.
There is moderate degenerative change in the knee joint. No
appreciable knee joint effusion.
IMPRESSION: No blastic or lytic bone lesions. No abnormal periosteal reaction.
No fracture or dislocation. Osteoarthritic change noted in left knee
joint.

## 2019-06-04 IMAGING — CR DG TIBIA/FIBULA 2V*L*
4 series · 4 of 4 positions shown · non-contrast
Comparison: None.

CLINICAL DATA: Leg pain

EXAM:
LEFT TIBIA AND FIBULA - 2 VIEW

[t tib-fib lat left (1 of 2)]
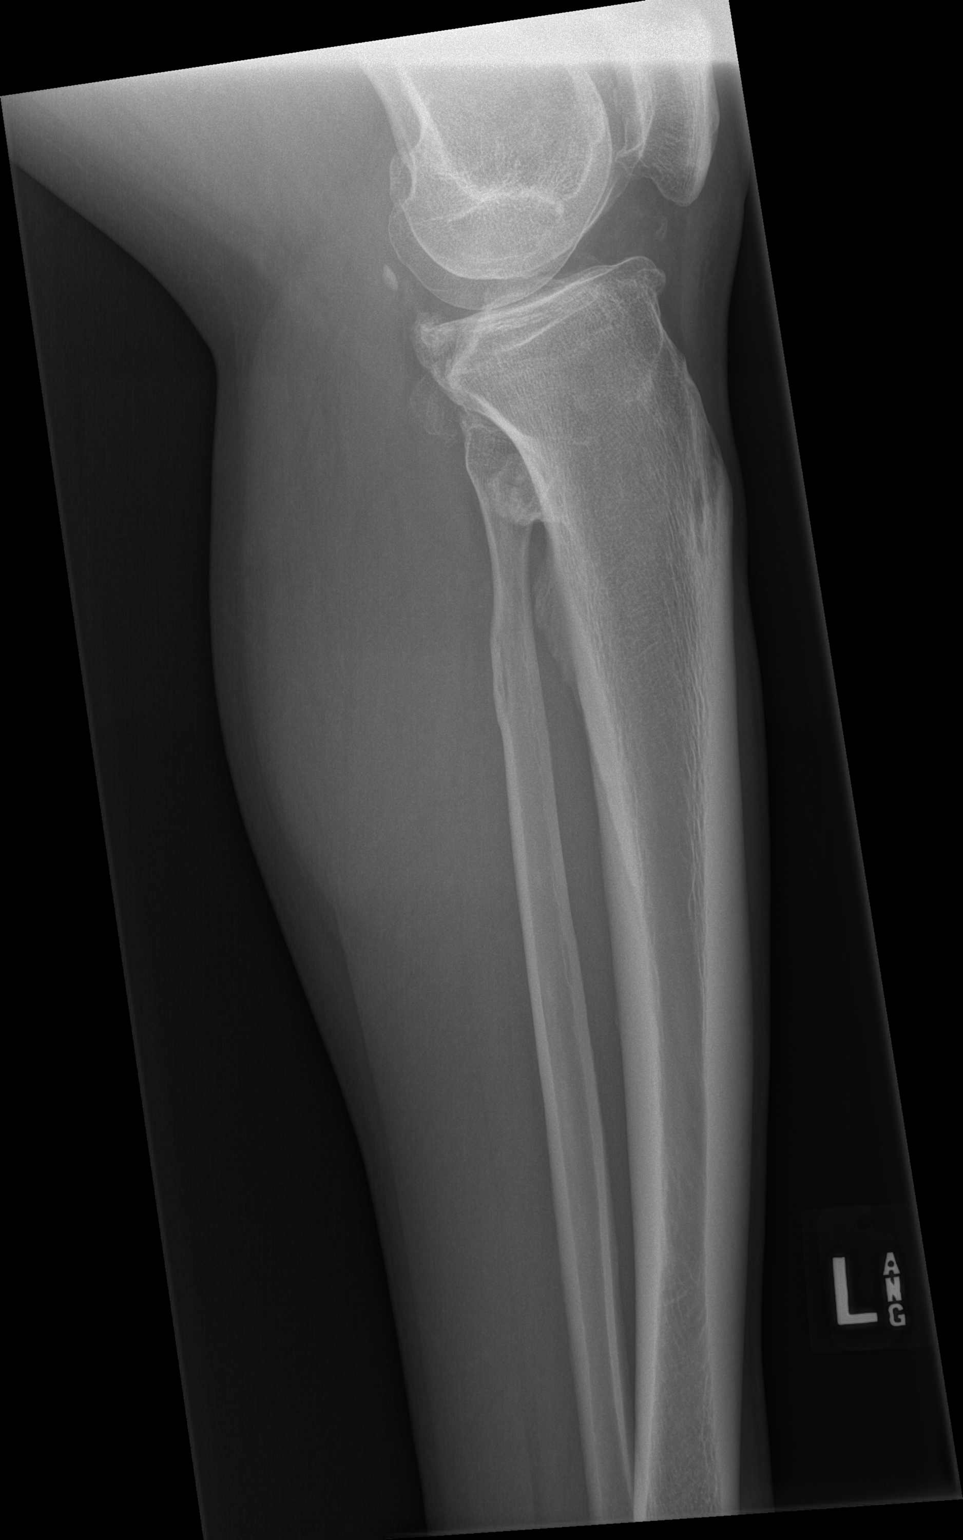

[t tib-fib lat left (2 of 2)]
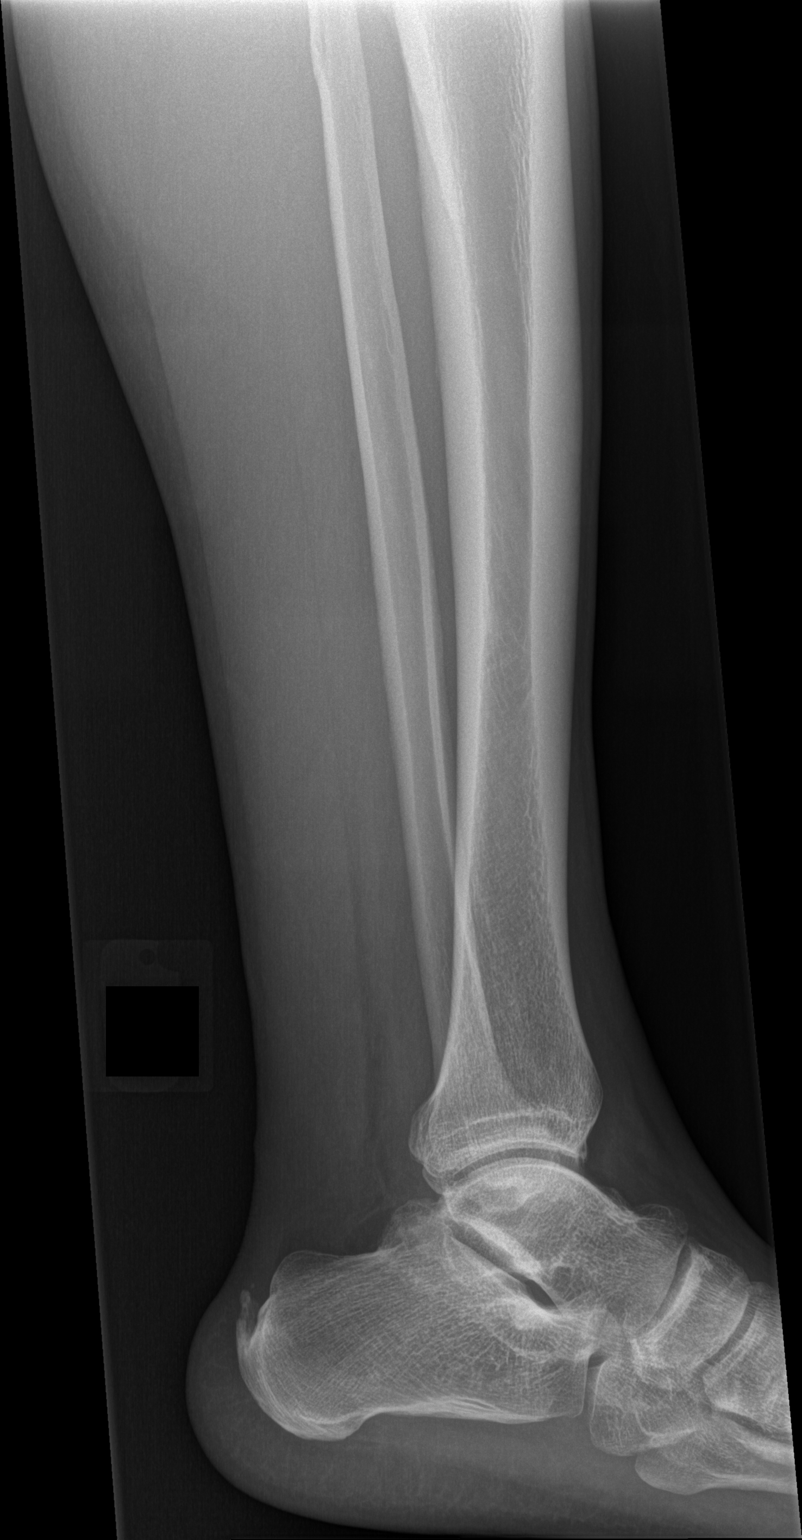

[t tib-fib ap left (1 of 2)]
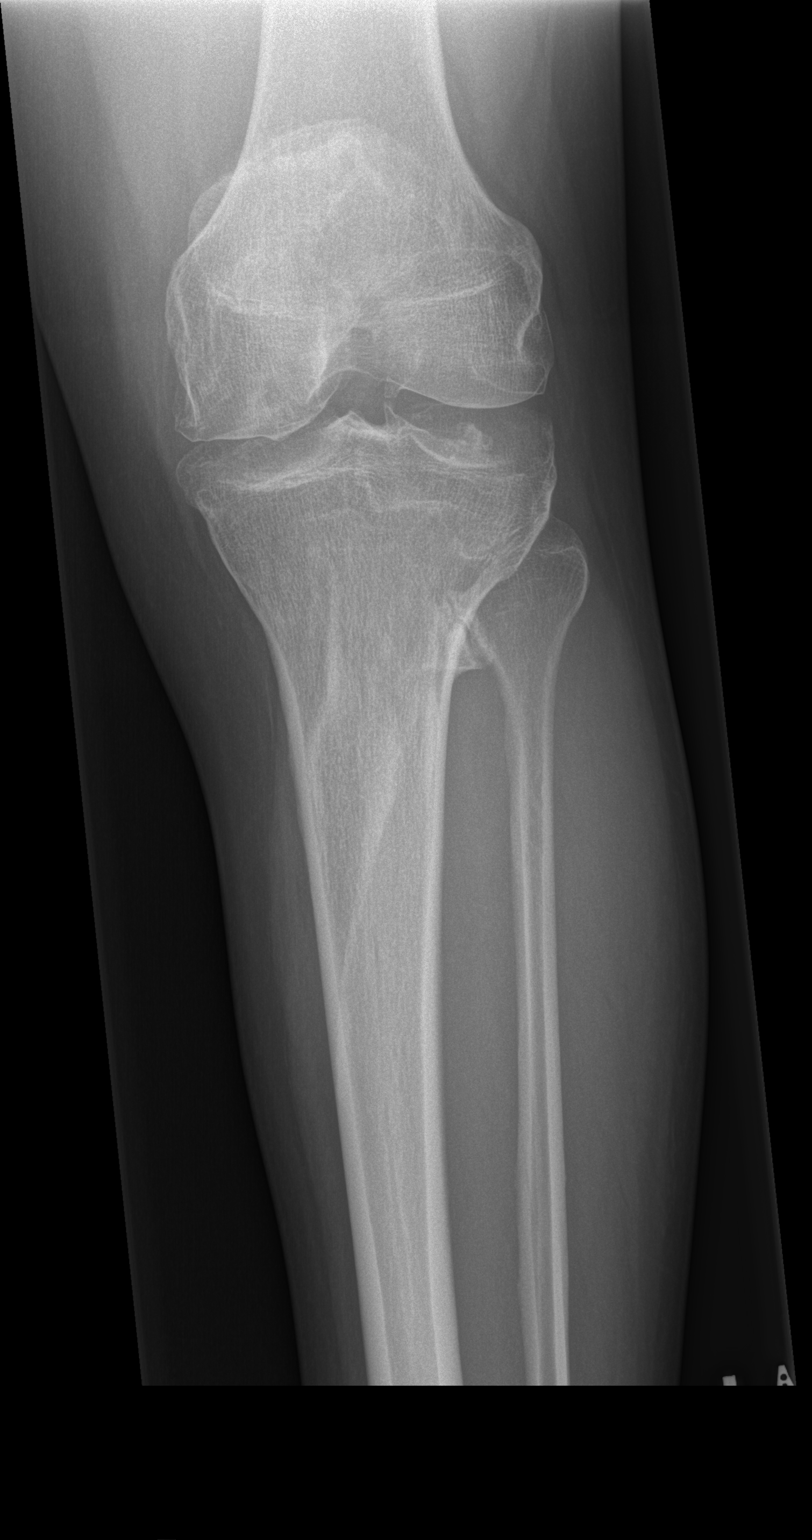

[t tib-fib ap left (2 of 2)]
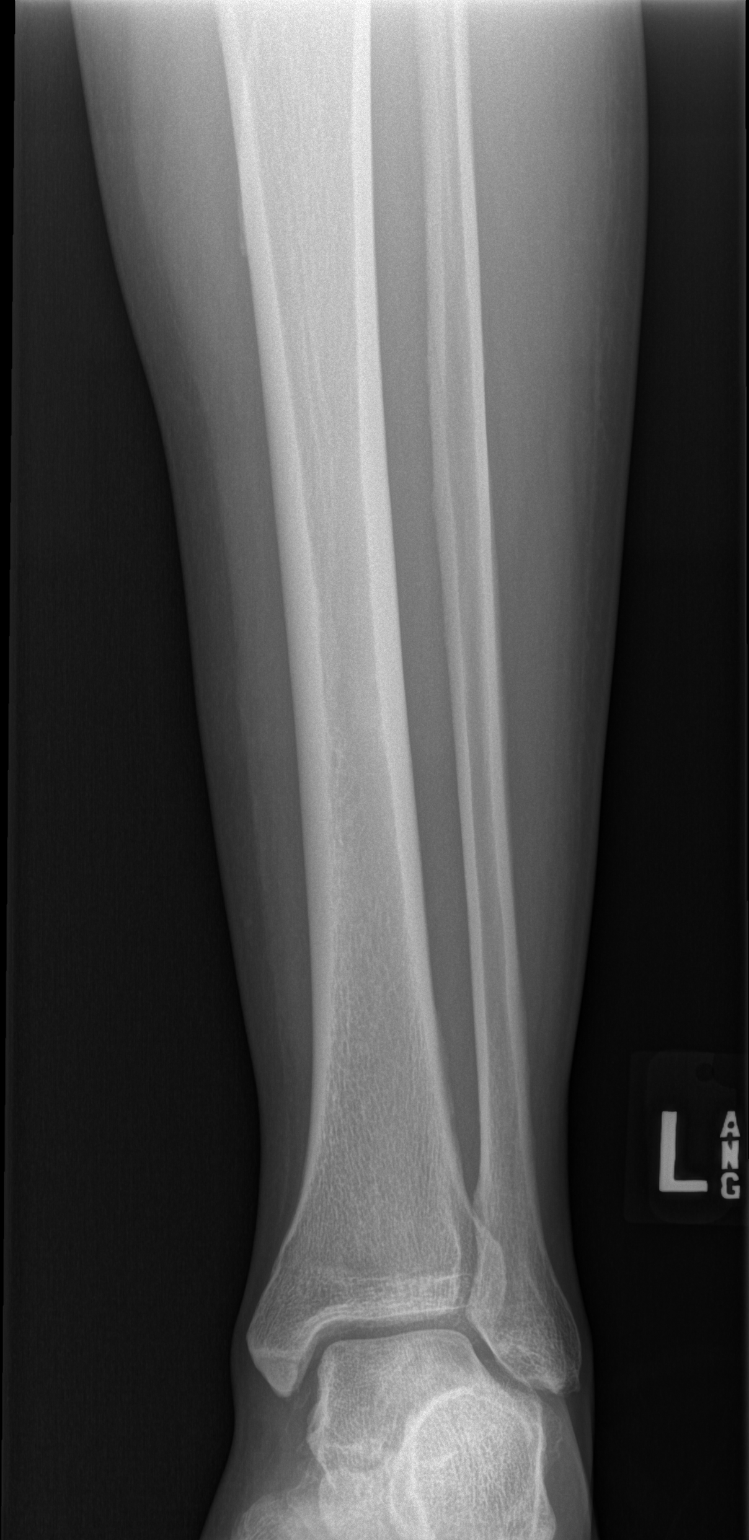

[4 of 4 positions shown; findings below may reference images not displayed]

FINDINGS: No fracture of the tibia or fibula. Knee joint and ankle joint
appear normal on two views.

The lateral tibial plateau appears irregular however on the
comparison femur exam same day the lateral tibial plateau appears
normal.
IMPRESSION: No fracture or dislocation. No aggressive osseous lesion identified.

## 2019-06-04 IMAGING — CT CT HEAD W/O CM
3 series · 14 of 47 positions shown, 16 images · non-contrast
Comparison: Brain MRI [DATE], head CT [DATE]

CLINICAL DATA: Worsening headaches. Additional history provided:
Recently diagnosed with metastatic lung cancer.

EXAM:
CT HEAD WITHOUT CONTRAST
TECHNIQUE: Contiguous axial images were obtained from the base of the skull
through the vertex without intravenous contrast.

[Series 2: head wo · axial · 0.47mm/px · z∈[-138,-3]mm · 8 of 33 slices shown, 10 images]
[im 3/33  brain]
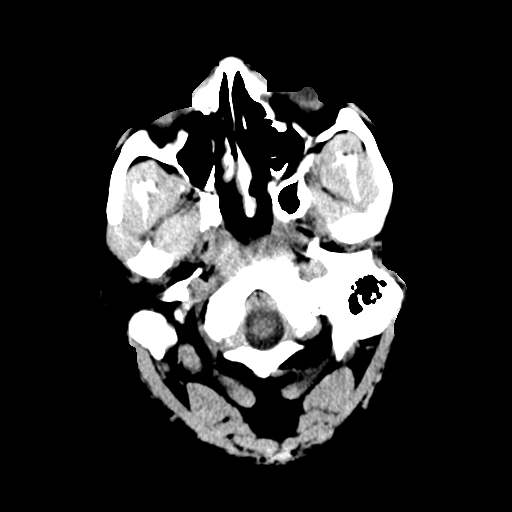
[im 3/33  bone]
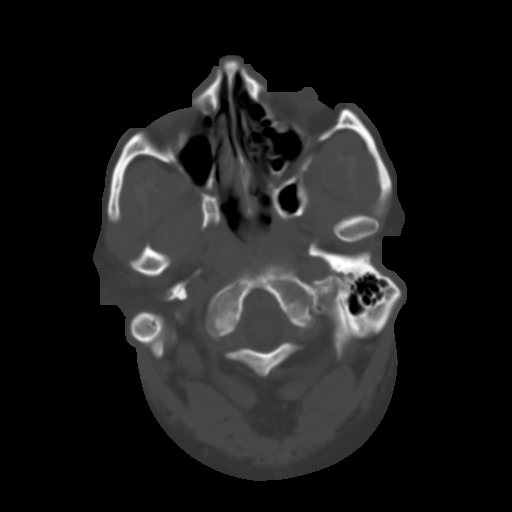
[im 7/33  brain]
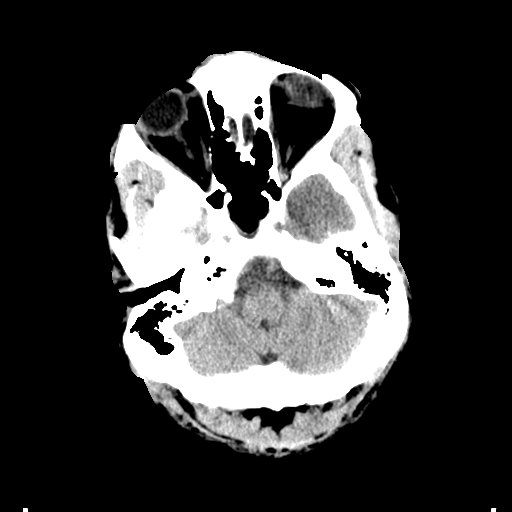
[im 10/33  brain]
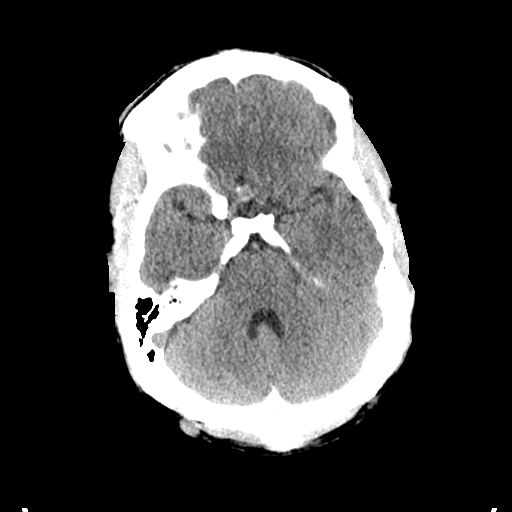
[im 15/33  brain]
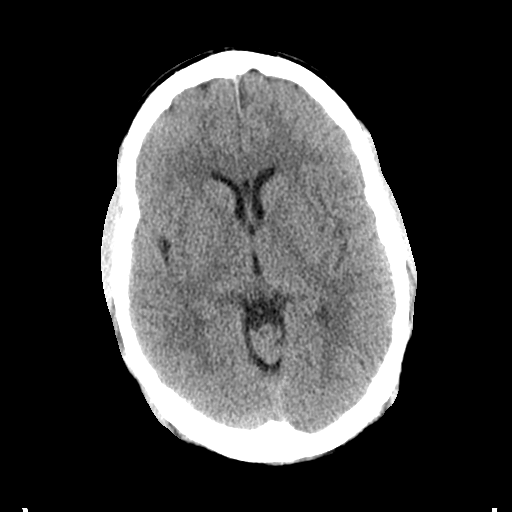
[im 18/33  brain]
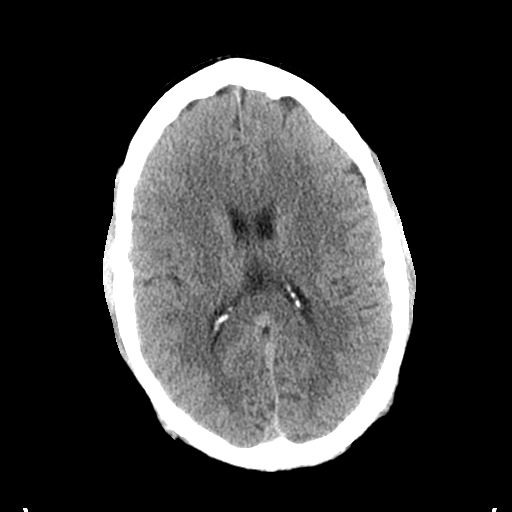
[im 18/33  bone]
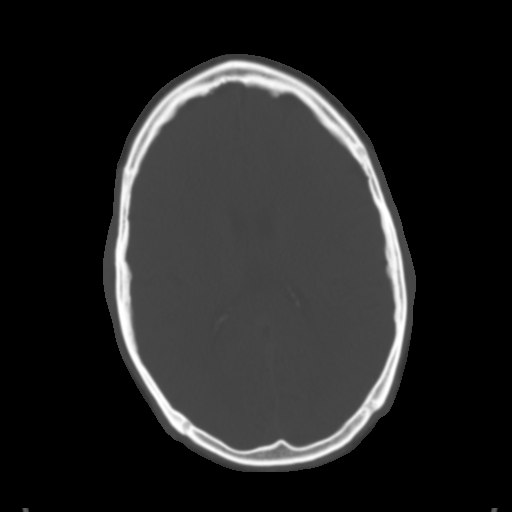
[im 23/33  brain]
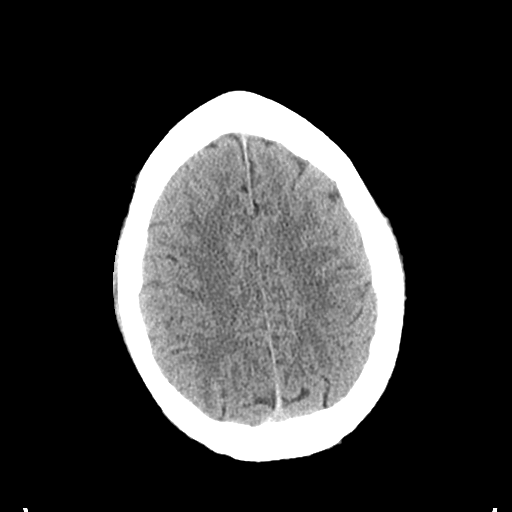
[im 26/33  brain]
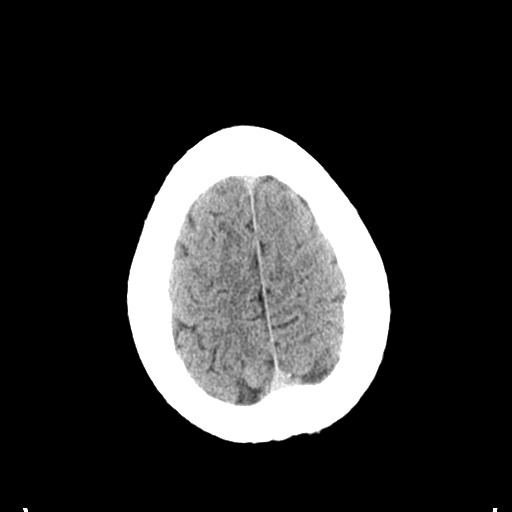
[im 30/33  brain]
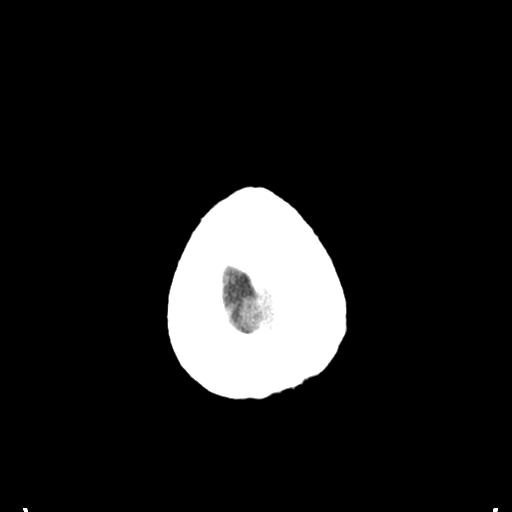

[Series 4: coronal soft tissue · coronal · 0.34mm/px · 3 of 65 slices shown]
[im 22/65  brain]
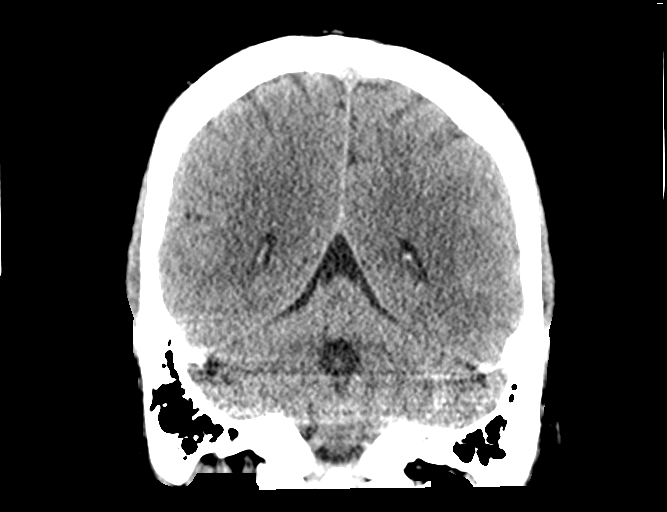
[im 29/65  brain]
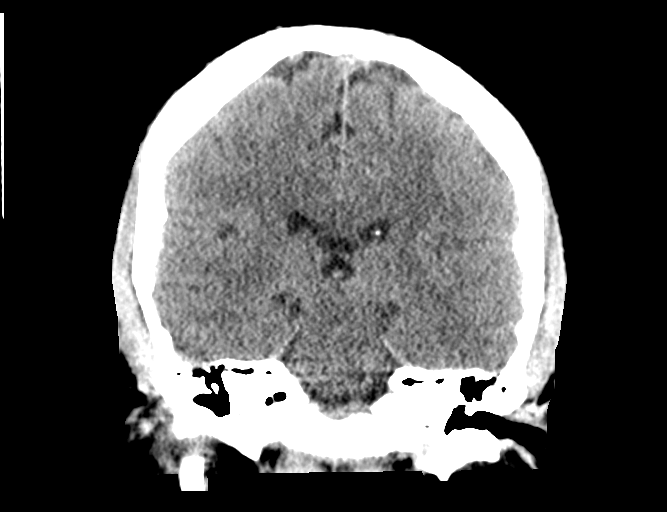
[im 36/65  brain]
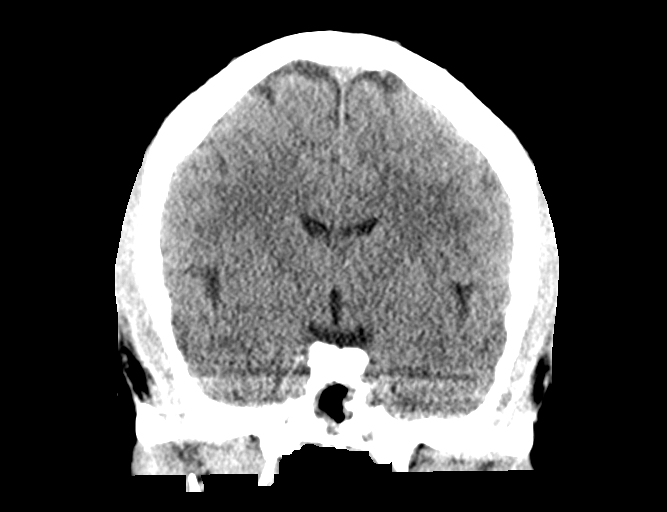

[Series 5: sagittal soft tissue · sagittal · 0.35mm/px · 3 of 53 slices shown]
[im 18/53  brain]
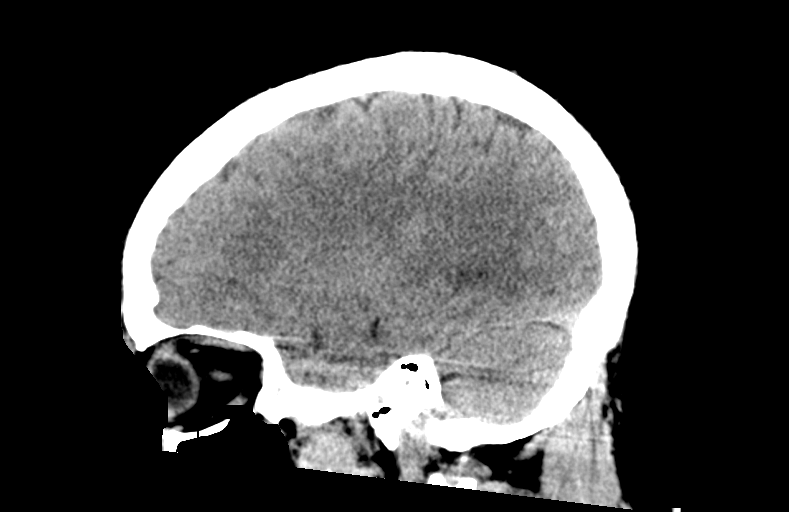
[im 27/53  brain]
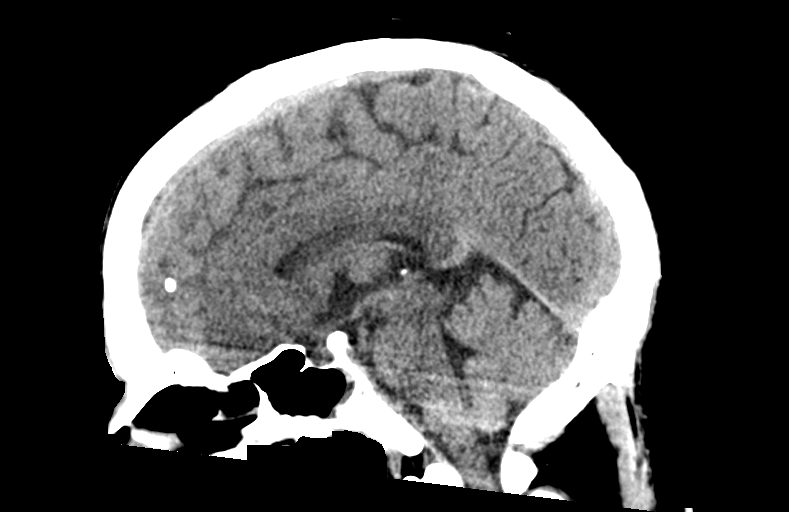
[im 35/53  brain]
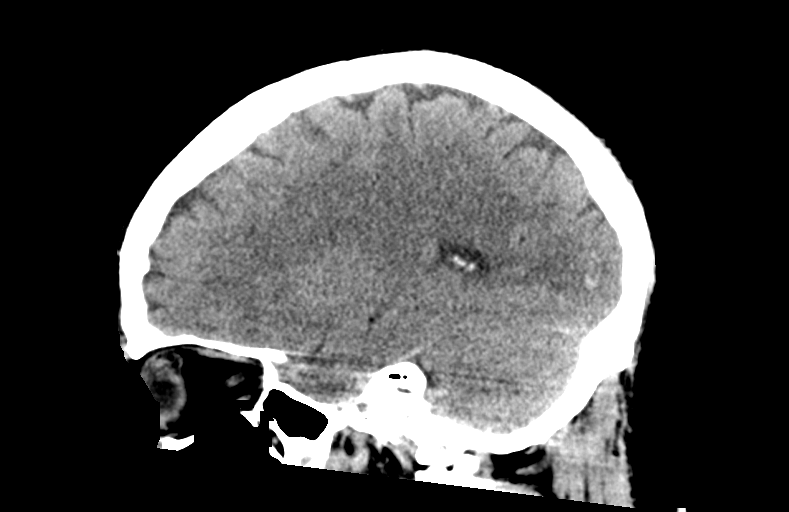

[14 of 47 positions shown; findings below may reference images not displayed]

FINDINGS: Brain:

Numerous small enhancing parenchymal lesions were demonstrated on
prior MRI [DATE]. Some of these are appreciated on today's
examination but many of these are occult by CT. Several of these
lesions were hemorrhagic on the prior MRI. There is a 3 mm focus of
hyperdensity within the left occipital lobe consistent with
redemonstrated hemorrhage associated with a lesion at this site
(series 2, image 17). No definitively new lesion is identified.

No demarcated cortical infarct.

No extra-axial fluid collection.

No evidence of intracranial mass.

No midline shift.

Vascular: No hyperdense vessel.

Skull: No calvarial fracture. Suspected metastases within the skull
base and upper cervical spine were better appreciated on prior MRI.

Sinuses/Orbits: No acute orbital abnormality by CT. Please note
subtle enhancement along the posterior left globe with suspected on
prior MRI. Ethmoid and sphenoid sinus mucosal thickening. Right
sphenoid sinus air-fluid level. No significant mastoid effusion
IMPRESSION: Numerous small parenchymal lesions (likely metastases) were
demonstrated on prior MRI [DATE]. Some of these lesions are
appreciated on today's examination, but many of these are occult by
CT. There is a 3 mm focus of hyperdensity consistent with hemorrhage
at site of a known left occipital lobe lesion. However, this is
unchanged and hemorrhage was present at this site on prior MRI.

No definitively new lesion or interval acute intracranial
abnormality is identified.

Suspected metastases within the skull base and upper cervical spine
were better appreciated on prior MRI.

Paranasal sinus mucosal thickening with right sphenoid sinus
air-fluid level. Correlate for acute sinusitis.

## 2019-06-04 MED ORDER — RIVAROXABAN 20 MG PO TABS: 20.0000 mg | ORAL_TABLET | Freq: Every day | ORAL | Status: DC

## 2019-06-04 MED ORDER — SODIUM CHLORIDE 0.9 % IV BOLUS
1000.0000 mL | Freq: Once | INTRAVENOUS | Status: AC
  Administered 2019-06-04: 1000 mL via INTRAVENOUS

## 2019-06-04 MED ORDER — ONDANSETRON HCL 4 MG/2ML IJ SOLN
4.0000 mg | Freq: Once | INTRAMUSCULAR | Status: AC
  Administered 2019-06-04: 4 mg via INTRAVENOUS
  Filled 2019-06-04 (×2): qty 2

## 2019-06-04 MED ORDER — RIVAROXABAN 15 MG PO TABS
15.0000 mg | ORAL_TABLET | Freq: Two times a day (BID) | ORAL | Status: DC
  Administered 2019-06-04: 15 mg via ORAL
  Filled 2019-06-04: qty 1

## 2019-06-04 MED ORDER — HYDROMORPHONE HCL 2 MG/ML IJ SOLN
2.0000 mg | Freq: Once | INTRAMUSCULAR | Status: AC
  Administered 2019-06-04: 2 mg via INTRAVENOUS
  Filled 2019-06-04 (×2): qty 1

## 2019-06-04 MED ORDER — RIVAROXABAN (XARELTO) VTE STARTER PACK (15 & 20 MG)
ORAL_TABLET | ORAL | 0 refills | Status: DC
Start: 2019-06-04 — End: 2019-06-29

## 2019-06-04 MED ORDER — OXYCODONE HCL 5 MG PO TABS: 5.0000 mg | ORAL_TABLET | Freq: Four times a day (QID) | ORAL | 0 refills | Status: DC | PRN

## 2019-06-04 NOTE — ED Triage Notes (Signed)
Patient presents with worsening headaches and left leg pain. Patient with difficulty sitting still. Patient diagnosed with cancer recently and states the pain has not been this bad. Patient states his knee feels tighter and swollen than normal.

## 2019-06-04 NOTE — Progress Notes (Signed)
Left lower extremity venous duplex has been completed. Preliminary results can be found in CV Proc through chart review.  Results were given to Dr. Tomi Bamberger.  06/04/19 10:58 AM Carlos Levering RVT

## 2019-06-04 NOTE — Discharge Instructions (Signed)
Take the blood thinning medications as prescribed for the DVT.  Follow-up with your oncology doctor to discuss duration of treatment.  Return to the ED as needed for worsening symptoms.  I did also provide a refill of your oxycodone medication

## 2019-06-04 NOTE — ED Provider Notes (Signed)
[facility] DEPT Provider Note   CSN: [id number] Arrival date & time: 06/04/19  0555     History Chief Complaint  Patient presents with  . Leg Pain  . Headache    William Ali is a 34 y.o. male.  HPI   Pt has metastatic lung cancer.  Pt is undergoing radiation treatments to the brain, spine and pelvic region.   He has not started chemo yet.  Pt has been having pain like this associated with the cancer.  A couple of days ago he started having more pain in the left leg.  In the upper and lower leg.  Pain increases with movement.   NO numbness.  He is also having persistent headaches with the cancer.  Pt had been taking oxycodone but he ran out.  Patient denies any fevers or chills.  He denies any chest pain or shortness of breath.  He does not have any history of DVT or PE  Prior records reviewed.  Pt does have history of l4 disc herniation possibly related to veterbral body metastatic disease  Past Medical History:  Diagnosis Date  . Back pain   . Cancer ([place])   . Hypertension     Patient Active Problem List   Diagnosis Date Noted  . Goals of care, counseling/discussion 05/21/2019  . 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 Adenocarcinoma of lung ([place]) 05/21/2019    Past Surgical History:  Procedure Laterality Date  . KNEE SURGERY         Family History  Problem Relation Age of Onset  . Hypertension Mother   . Diabetes Father     Social History   Tobacco Use  . Smoking status: Never Smoker  . Smokeless tobacco: Never Used  Substance Use Topics  . Alcohol use: No  . Drug use: No    Home Medications Prior to Admission medications   Medication Sig Start Date End Date Taking? Authorizing Provider  albuterol (VENTOLIN HFA) 108 (90 Base) MCG/ACT inhaler Inhale 2 puffs into the lungs every 6 (six) hours as needed for wheezing or shortness of breath. 05/19/19  Yes Orson Slick, MD  dexamethasone (DECADRON) 2 MG  tablet Take 2 mg by mouth daily.   Yes [provider]  hydrochlorothiazide (HYDRODIURIL) 25 MG tablet Take 1 tablet (25 mg total) by mouth daily. 07/21/18  Yes Lamptey, Myrene Galas, MD  pantoprazole (PROTONIX) 40 MG tablet Take 40 mg by mouth daily. 04/11/19  Yes [provider]  benzonatate (TESSALON) 100 MG capsule Take 1 capsule (100 mg total) by mouth every 8 (eight) hours. Patient not taking: Reported on 05/25/2019 05/05/19   Darr, Marguerita Beards, PA-C  cetirizine (ZYRTEC) 10 MG tablet Take 1 tablet (10 mg total) by mouth daily. Patient not taking: Reported on 06/02/2019 11/23/18   Augusto Gamble B, NP  dexamethasone (DECADRON) 4 MG tablet Take 2 tablets (8 mg total) by mouth daily. Patient not taking: Reported on 06/02/2019 05/19/19   Orson Slick, MD  HYDROcodone-acetaminophen (NORCO/VICODIN) 5-325 MG tablet Take 1 tablet by mouth every 4 (four) hours as needed for up to 10 doses. Patient not taking: Reported on 05/23/2019 05/10/19   Corena Herter, PA-C  LORazepam (ATIVAN) 0.5 MG tablet 1 tab po q 4-6 hours prn or 1 tab po 30 minutes prior to radiation Patient not taking: Reported on 06/02/2019 05/25/19   Hayden Pedro, PA-C  ondansetron (ZOFRAN) 8 MG tablet Take 1 tablet (8  mg total) by mouth every 8 (eight) hours as needed for nausea or vomiting. Patient not taking: Reported on 06/02/2019 05/30/19   Hayden Pedro, PA-C  oxyCODONE (OXY IR/ROXICODONE) 5 MG immediate release tablet Take 1-2 tablets (5-10 mg total) by mouth every 6 (six) hours as needed for moderate pain or severe pain. 06/04/19   Dorie Rank, MD  RIVAROXABAN [employer]) VTE STARTER PACK (15 & 20 MG TABLETS) Follow package directions: Take one 15mg  tablet by mouth twice a day. On day 22, switch to one 20mg  tablet once a day. Take with food. 06/04/19   Dorie Rank, MD  sodium chloride (OCEAN) 0.65 % SOLN nasal spray Place 1 spray into both nostrils as needed for congestion. Patient not taking: Reported on 06/02/2019  05/05/19   Darr, Marguerita Beards, PA-C  traMADol (ULTRAM) 50 MG tablet Take 1 tablet (50 mg total) by mouth every 6 (six) hours as needed. Patient not taking: Reported on 06/02/2019 05/06/19   Milton Ferguson, MD  amLODipine (NORVASC) 5 MG tablet Take 1 tablet (5 mg total) by mouth daily. 04/21/18 07/21/18  Robyn Haber, MD  famotidine (PEPCID) 20 MG tablet Take 1 tablet (20 mg total) by mouth 2 (two) times daily. Patient taking differently: Take 20 mg by mouth 2 (two) times daily as needed for heartburn.  04/21/18 07/21/18  Robyn Haber, MD    Allergies    Onion, Other, Peanut oil, and Peanut-containing drug products  Review of Systems   Review of Systems  All other systems reviewed and are negative.   Physical Exam Updated Vital Signs BP 138/73   Pulse 92   Temp (!) 97.5 F (36.4 C) (Oral)   Resp 18   Ht 1.702 m (5\' 7" )   Wt 93.9 kg   SpO2 100%   BMI 32.42 kg/m   Physical Exam Vitals and nursing note reviewed.  Constitutional:      General: He is not in acute distress.    Appearance: He is well-developed. He is ill-appearing.  HENT:     Head: Normocephalic and atraumatic.     Right Ear: External ear normal.     Left Ear: External ear normal.  Eyes:     General: No scleral icterus.       Right eye: No discharge.        Left eye: No discharge.     Conjunctiva/sclera: Conjunctivae normal.  Neck:     Trachea: No tracheal deviation.  Cardiovascular:     Rate and Rhythm: Normal rate and regular rhythm.  Pulmonary:     Effort: Pulmonary effort is normal. No respiratory distress.     Breath sounds: Normal breath sounds. No stridor. No wheezing or rales.  Abdominal:     General: Bowel sounds are normal. There is no distension.     Palpations: Abdomen is soft.     Tenderness: There is no abdominal tenderness. There is no guarding or rebound.  Musculoskeletal:        General: Tenderness present.     Cervical back: Neck supple.     Comments: ttp left calf muscle, no erythema,  mild edema  Skin:    General: Skin is warm and dry.     Findings: No rash.  Neurological:     Mental Status: He is alert.     Cranial Nerves: No cranial nerve deficit (no facial droop, extraocular movements intact, no slurred speech).     Sensory: No sensory deficit.     Motor: No abnormal muscle  tone or seizure activity.     Coordination: Coordination normal.     Comments: Pain with leg extension, plantar and dorsiflexion of foot     ED Results / Procedures / Treatments   Labs (all labs ordered are listed, but only abnormal results are displayed) Labs Reviewed  CBC WITH DIFFERENTIAL/PLATELET - Abnormal; Notable for the following components:      Result Value   WBC 14.0 (*)    RDW 16.3 (*)    Platelets 114 (*)    Neutro Abs 12.1 (*)    Abs Immature Granulocytes 0.13 (*)    All other components within normal limits  COMPREHENSIVE METABOLIC PANEL - Abnormal; Notable for the following components:   Potassium 5.2 (*)    BUN 39 (*)    Creatinine, Ser 1.47 (*)    AST 71 (*)    ALT 190 (*)    Alkaline Phosphatase 514 (*)    Total Bilirubin 1.4 (*)    All other components within normal limits    EKG None  Radiology DG Tibia/Fibula Left  Result Date: 06/04/2019 CLINICAL DATA:  Leg pain EXAM: LEFT TIBIA AND FIBULA - 2 VIEW COMPARISON:  None. FINDINGS: No fracture of the tibia or fibula. Knee joint and ankle joint appear normal on two views. The lateral tibial plateau appears irregular however on the comparison femur exam same day the lateral tibial plateau appears normal. IMPRESSION: No fracture or dislocation. No aggressive osseous lesion identified. Electronically Signed   By: Suzy Bouchard M.D.   On: 06/04/2019 09:00   CT Head Wo Contrast  Result Date: 06/04/2019 CLINICAL DATA:  Worsening headaches. Additional history provided: Recently diagnosed with metastatic lung cancer. EXAM: CT HEAD WITHOUT CONTRAST TECHNIQUE: Contiguous axial images were obtained from the base of the  skull through the vertex without intravenous contrast. COMPARISON:  Brain MRI 05/10/2019, head CT 09/13/2012 FINDINGS: Brain: Numerous small enhancing parenchymal lesions were demonstrated on prior MRI 05/10/2019. Some of these are appreciated on today's examination but many of these are occult by CT. Several of these lesions were hemorrhagic on the prior MRI. There is a 3 mm focus of hyperdensity within the left occipital lobe consistent with redemonstrated hemorrhage associated with a lesion at this site (series 2, image 17). No definitively new lesion is identified. No demarcated cortical infarct. No extra-axial fluid collection. No evidence of intracranial mass. No midline shift. Vascular: No hyperdense vessel. Skull: No calvarial fracture. Suspected metastases within the skull base and upper cervical spine were better appreciated on prior MRI. Sinuses/Orbits: No acute orbital abnormality by CT. Please note subtle enhancement along the posterior left globe with suspected on prior MRI. Ethmoid and sphenoid sinus mucosal thickening. Right sphenoid sinus air-fluid level. No significant mastoid effusion IMPRESSION: Numerous small parenchymal lesions (likely metastases) were demonstrated on prior MRI 05/10/2019. Some of these lesions are appreciated on today's examination, but many of these are occult by CT. There is a 3 mm focus of hyperdensity consistent with hemorrhage at site of a known left occipital lobe lesion. However, this is unchanged and hemorrhage was present at this site on prior MRI. No definitively new lesion or interval acute intracranial abnormality is identified. Suspected metastases within the skull base and upper cervical spine were better appreciated on prior MRI. Paranasal sinus mucosal thickening with right sphenoid sinus air-fluid level. Correlate for acute sinusitis. Electronically Signed   By: Kellie Simmering DO   On: 06/04/2019 08:44   DG Chest Portable 1 View  Result Date:  06/02/2019 CLINICAL DATA:  Hervey Ard right-sided chest pain EXAM: PORTABLE CHEST 1 VIEW COMPARISON:  May 06, 2019 FINDINGS: The previously demonstrated right upper lobe pulmonary nodule is better visualized on the patient's prior CT chest and prior x-rays. There is no pneumothorax. No significant pleural effusion. No evidence for an acute osseous abnormality. The heart size is normal. There is some mild thickening of the right paratracheal stripe which may be secondary to underlying mediastinal adenopathy. IMPRESSION: 1. Mild thickening of the right paratracheal stripe which may be secondary to underlying mediastinal adenopathy. 2. The previously demonstrated right upper lobe pulmonary nodule is better visualized on the patient's prior CT chest and prior x-rays. 3. No acute cardiopulmonary process. Electronically Signed   By: Constance Holster M.D.   On: 06/02/2019 17:55   DG Femur Min 2 Views Left  Result Date: 06/04/2019 CLINICAL DATA:  Lower extremity pain. EXAM: LEFT FEMUR 2 VIEWS COMPARISON:  None. FINDINGS: Frontal and lateral views were obtained. No fracture or dislocation. No abnormal periosteal reaction. No blastic or lytic bone lesions. There is moderate degenerative change in the knee joint. No appreciable knee joint effusion. IMPRESSION: No blastic or lytic bone lesions. No abnormal periosteal reaction. No fracture or dislocation. 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 Electronically Signed   By: Lowella Grip III M.D.   On: 06/04/2019 08:55   VAS [place] LOWER EXTREMITY VENOUS (DVT) (ONLY [facility] & [facility])  Result Date: 06/04/2019  Lower Venous DVTStudy Indications: Swelling.  Risk Factors: None identified. Limitations: Poor ultrasound/tissue interface. Comparison Study: No prior studies. Performing Technologist: Oliver Hum RVT  Examination Guidelines: A complete evaluation includes B-mode imaging, spectral Doppler, color Doppler, and power Doppler as needed of all accessible portions of each  vessel. Bilateral testing is considered an integral part of a complete examination. Limited examinations for reoccurring indications may be performed as noted. The reflux portion of the exam is performed with the patient in reverse Trendelenburg.  +-----+---------------+---------+-----------+----------+--------------+ RIGHTCompressibilityPhasicitySpontaneityPropertiesThrombus Aging +-----+---------------+---------+-----------+----------+--------------+ CFV  Full           Yes      Yes                                 +-----+---------------+---------+-----------+----------+--------------+   +---------+---------------+---------+-----------+----------+--------------+ LEFT     CompressibilityPhasicitySpontaneityPropertiesThrombus Aging +---------+---------------+---------+-----------+----------+--------------+ CFV      Full           Yes      Yes                                 +---------+---------------+---------+-----------+----------+--------------+ SFJ      Full                                                        +---------+---------------+---------+-----------+----------+--------------+ FV Prox  Full                                                        +---------+---------------+---------+-----------+----------+--------------+ FV Mid   Full                                                        +---------+---------------+---------+-----------+----------+--------------+  FV DistalFull                                                        +---------+---------------+---------+-----------+----------+--------------+ PFV      Full                                                        +---------+---------------+---------+-----------+----------+--------------+ POP      None           No       No                   Acute          +---------+---------------+---------+-----------+----------+--------------+ PTV      None                                          Acute          +---------+---------------+---------+-----------+----------+--------------+ PERO     None                                         Acute          +---------+---------------+---------+-----------+----------+--------------+ Gastroc  None                                         Acute          +---------+---------------+---------+-----------+----------+--------------+     Summary: RIGHT: - No evidence of common femoral vein obstruction.  LEFT: - Findings consistent with acute deep vein thrombosis involving the left popliteal vein, left posterior tibial veins, left peroneal veins, and left gastrocnemius veins. - No cystic structure found in the popliteal fossa.  *See table(s) above for measurements and observations. Electronically signed by Deitra Mayo MD on 06/04/2019 at 12:00:49 PM.    Final     Procedures Procedures (including critical care time)  Medications Ordered in ED Medications  Rivaroxaban (XARELTO) tablet 15 mg (15 mg Oral Given 06/04/19 1154)    Followed by  rivaroxaban (XARELTO) tablet 20 mg (has no administration in time range)  sodium chloride 0.9 % bolus 1,000 mL (0 mLs Intravenous Stopped 06/04/19 1115)  ondansetron (ZOFRAN) injection 4 mg (4 mg Intravenous Given 06/04/19 0730)  HYDROmorphone (DILAUDID) injection 2 mg (2 mg Intravenous Given 06/04/19 0730)    ED Course  I have reviewed the triage vital signs and the nursing notes.  Pertinent labs & imaging results that were available during my care of the patient were reviewed by me and considered in my medical decision making (see chart for details).  Clinical Course as of Jun 04 1522  Sun Jun 04, 2019  4818 LFTs elevated.  Similar to previous values   [JK]  0914 Leukocytosis noted but appears stable   [JK]  0915 Head CT shows abnormalities noted previously but no acute findings   [JK]  Clinical Course User Index [JK] Dorie Rank, MD   MDM Rules/Calculators/A&P                       Patient presented the ED with complaints of headache and leg pain.  Patient has known history of metastatic cancer with brain and bony metastases.  [name] laboratory tests are unremarkable.  His CT scan did not show any acute findings.  I suspect his headache is related to his known metastatic disease.  Patient did have recent MRIs that did show some left-sided L4 nerve impingement however the patient was complaining of leg swelling and new pain so a Doppler study was performed.  This did show a DVT in his left lower extremity.  Patient was treated with pain medications.  He was also given a dose of Xarelto.  Symptoms improved and is feeling more comfortable.  Patient was discharged home with a prescription for Xarelto and a refill of his pain medications. Final Clinical Impression(s) / ED Diagnoses Final diagnoses:  Acute deep vein thrombosis (DVT) of popliteal vein of left lower extremity (HCC)  Cancer associated pain    Rx / DC Orders ED Discharge Orders         Ordered    RIVAROXABAN (XARELTO) VTE STARTER PACK (15 & 20 MG TABLETS)     06/04/19 1146    oxyCODONE (OXY IR/ROXICODONE) 5 MG immediate release tablet  Every 6 hours PRN     06/04/19 1149           Dorie Rank, MD 06/04/19 1526

## 2019-06-05 ENCOUNTER — Other Ambulatory Visit: Payer: Self-pay

## 2019-06-05 ENCOUNTER — Telehealth: Payer: Self-pay | Admitting: *Deleted

## 2019-06-05 ENCOUNTER — Ambulatory Visit
Admission: RE | Admit: 2019-06-05 | Discharge: 2019-06-05 | Disposition: A | Discharge status: Home / Self Care | Payer: PRIVATE HEALTH INSURANCE | Source: Ambulatory Visit | Attending: Radiation Oncology | Admitting: Radiation Oncology

## 2019-06-05 ENCOUNTER — Encounter: Payer: Self-pay | Admitting: *Deleted

## 2019-06-05 DIAGNOSIS — Z510 Encounter for antineoplastic radiation therapy: Secondary | ICD-10-CM | POA: Diagnosis not present

## 2019-06-05 NOTE — Telephone Encounter (Signed)
Received call from pt's wife, William Ali. She states she has several questions. She explained that William Ali had been to the ED yesterday for left calf pain and was diagnosed with a left leg DVT and was started on Xarelto.  She is aware of pt's appt to have a port placed on Thursday. Advised that William Ali should not take the Xarelton on Wednesday or Thursday and resume on Friday .  [facility] asked about appropriate activity levels for him. Advised to keep active as much as possible-walking outside when weather is nice or inside their apartment  Otherwise.  Advised that he should not just stay in bed, that the activity will help maintain his strength and energy levels. William Ali also had questions concerning his decadron dosing.  He saw an opthamalogist @ [facility] last week who recommended decreasing his decadron.  The MD states his problem with his left has gotten worse since his last visit. Pt is now on 2 mg/day but his headaches are worse on the lower dosage.  Spoke with [name] about the steroids and he will do a phone visit with them  Tomorrow at 2pm. Reviewed pt's pain meds due to his increase in pain from DVT and headaches, along with L4 compression of nerve root causing back and leg pain. Advised that he can take Oxycodone 5 mg 2 tabs every 6 hours and can alternate with Tylenol. Pt is having some problems with constipation. Advised to start Senna S 2 tabs every night. And may increase as needed.  Pt has been c/o abd bloating with the constipation and as well as some epigastric discomfort. He is taking the pantoprazole. Advised to make sure he takes the decadron with food. Noted pt's creatinine and BUN are elevated from yesterday's labs in ED-advised to increase his fluid intake to 64 oz per day.  William Ali also stated she is worried about their children-they are staying with her mother in [place] but the 9 yo voiced her fears that her father is dying.  Discussed [facility] as an option for support/counselling. Will also  send message to [name], SW to call William Ali about this as well as ongoing financial concerns. Spoke to [name] in financial counseling as pt's insurance does not cover [employer].  William Ali voiced that she felt like her concerns were addressed today and appreciated the time spent with her

## 2019-06-05 NOTE — Progress Notes (Signed)
[place] Work  Clinical Social Work contacted patient at home to offer support and assess for needs.  CSW spoke with patient and patients wife [place].  CSW provided information on the [facility] support team and CSW role.  Patient and patients wife expressed financial concerns with treatment expenses.  CSW, patient and Cindra Eves discussed financial resources.  Patient is currently not working, but receiving unemployment.  Patients wife expressed concern for insurance they recently signed up for through the marketplace.  Patients wife reported [employer] is not in-network with [place].  Patient stated he applied for Medicaid prior to his diagnosis and was denied.  CSW encouraged patient to reapply for Medicaid.  CSW contacted Ubaldo Glassing with go med assist/ first [employer].  She was able to screen patient and is scheduled to meet with patient and his wife tomorrow (5/11) after treatment to complete a medicaid application.  CSW encouraged patient to meet with the financial advocate at [facility] to apply for the [place].  CSW sent patients information to [facility] financial advocate.  CSW and patient discussed the [facility] and support they could provide with applying for Social Security Disability.  Patient was agreeable to referral.  CSW completed and submitted referral to [facility].   CSW, patient and patients wife also discussed support for their young children.  CSW encouraged them to attend the program "Parents with Cancer: Tips and Tools for Talking with Your Children" on May 13th.  Patients wife took program information and plans to register.  CSW provided contact information, and encouraged patient and Cindra Eves to call with questions or concerns.           Johnnye Lana, MSW, LCSW, OSW-C Clinical Social Worker [facility] [phone]

## 2019-06-06 ENCOUNTER — Other Ambulatory Visit: Payer: Self-pay | Admitting: Radiation Oncology

## 2019-06-06 ENCOUNTER — Inpatient Hospital Stay: Payer: Medicaid Other | Admitting: Internal Medicine

## 2019-06-06 ENCOUNTER — Other Ambulatory Visit: Payer: Self-pay

## 2019-06-06 ENCOUNTER — Ambulatory Visit
Admission: RE | Admit: 2019-06-06 | Discharge: 2019-06-06 | Disposition: A | Discharge status: Home / Self Care | Payer: PRIVATE HEALTH INSURANCE | Source: Ambulatory Visit | Attending: Radiation Oncology | Admitting: Radiation Oncology

## 2019-06-06 VITALS — BP 141/88 | HR 103 | Resp 22

## 2019-06-06 DIAGNOSIS — C7931 Secondary malignant neoplasm of brain: Secondary | ICD-10-CM

## 2019-06-06 DIAGNOSIS — Z510 Encounter for antineoplastic radiation therapy: Secondary | ICD-10-CM | POA: Diagnosis not present

## 2019-06-06 DIAGNOSIS — C3491 Malignant neoplasm of unspecified part of right bronchus or lung: Secondary | ICD-10-CM

## 2019-06-06 MED ORDER — HYDROMORPHONE HCL 1 MG/ML IJ SOLN
1.0000 mg | Freq: Once | INTRAMUSCULAR | Status: AC
  Administered 2019-06-06: 1 mg via INTRAMUSCULAR
  Filled 2019-06-06: qty 1

## 2019-06-06 MED ORDER — DEXAMETHASONE 4 MG PO TABS
4.0000 mg | ORAL_TABLET | Freq: Two times a day (BID) | ORAL | 0 refills | Status: DC
Start: 2019-06-06 — End: 2019-06-28

## 2019-06-06 NOTE — Progress Notes (Signed)
The patient came to therapy today late because of progressive headaches with aura, and nausea over the weekend that has been very difficult to manage even with oxycodone as well as persistent back and left leg pain. He is taking oxycodone but does not find relief when taking two of the 5 mg strength pills at a time. His headaches are constant and he reports taking pain medicine for this without relief. He did taper his dexamethasone from 4 mg a day to 2 mg a day over the last several days, he also describes seeing flashes of light in his vision and darkness in his left eye more so than on the right.  This has been slowly progressive, in the last few days as well.  He is sensitive to light and is wearing sunglasses.  In general this is a well-appearing African-American male in no acute distress he is alert and oriented x4 and appropriate throughout the examination.  Cardiopulmonary assessment does not reveal any acute findings.  He has normal effort.  I contacted [name] as well regarding the patient as he has been managing his steroids, we talked about trying to increase his steroids to 8 mg a day for 3 days, and [name] will follow up with him Friday rather than today.  Regarding his pain management, I did send in a new prescription for oxycodone 15 mg however reviewed with he and his wife that this should really be used for his bone aches and pains rather than for headaches as narcotics can cause rebound headaches.  We have to be careful due to his creatinine and liver function testing however I think it would be reasonable to consider using over-the-counter Tylenol 1-2 times per day at max, rather than ibuprofen.  We will follow this expectantly and he will proceed with treatment today.

## 2019-06-06 NOTE — Progress Notes (Signed)
Patient brought around to the clinic with complaints of terrible headache, back and leg pain.  He states he normally takes oxycodone at home, has not taken anything today.  He states the pain medication does not really work.  He was seen by [name] who has started to taper his decadron dosage.  After discussion with PA Dara Lords he was given 1mg  IM dilaudid and she has made some adjustments to his oxycodone prescription.  Patient and wife informed of the medication changes and both verbalized understanding of instructions.  The PA spoke with the patient as well and will reach out to [name].  Patient was transported via wheelchair to treatment machine.  Will continue to follow as necessary.  BP (!) 141/88   Pulse (!) 103   Resp (!) 22   SpO2 100%    Farra Nikolic M. Leonie Green, BSN

## 2019-06-07 ENCOUNTER — Other Ambulatory Visit: Payer: Self-pay

## 2019-06-07 ENCOUNTER — Ambulatory Visit
Admission: RE | Admit: 2019-06-07 | Discharge: 2019-06-07 | Disposition: A | Discharge status: Home / Self Care | Payer: PRIVATE HEALTH INSURANCE | Source: Ambulatory Visit | Attending: Radiation Oncology | Admitting: Radiation Oncology

## 2019-06-07 ENCOUNTER — Other Ambulatory Visit: Payer: Self-pay | Admitting: Radiology

## 2019-06-07 DIAGNOSIS — Z510 Encounter for antineoplastic radiation therapy: Secondary | ICD-10-CM | POA: Diagnosis not present

## 2019-06-08 ENCOUNTER — Ambulatory Visit
Admission: RE | Admit: 2019-06-08 | Discharge: 2019-06-08 | Disposition: A | Discharge status: Home / Self Care | Payer: PRIVATE HEALTH INSURANCE | Source: Ambulatory Visit | Attending: Radiation Oncology | Admitting: Radiation Oncology

## 2019-06-08 ENCOUNTER — Inpatient Hospital Stay (HOSPITAL_COMMUNITY): Admission: RE | Admit: 2019-06-08 | Payer: Medicaid Other | Source: Ambulatory Visit

## 2019-06-08 ENCOUNTER — Other Ambulatory Visit: Payer: Self-pay

## 2019-06-08 ENCOUNTER — Ambulatory Visit (HOSPITAL_COMMUNITY): Payer: Medicaid Other

## 2019-06-08 DIAGNOSIS — Z510 Encounter for antineoplastic radiation therapy: Secondary | ICD-10-CM | POA: Diagnosis not present

## 2019-06-09 ENCOUNTER — Inpatient Hospital Stay: Payer: Medicaid Other | Attending: Hematology and Oncology | Admitting: Medical

## 2019-06-09 ENCOUNTER — Ambulatory Visit
Admission: RE | Admit: 2019-06-09 | Discharge: 2019-06-09 | Disposition: A | Discharge status: Home / Self Care | Payer: PRIVATE HEALTH INSURANCE | Source: Ambulatory Visit | Attending: Radiation Oncology | Admitting: Radiation Oncology

## 2019-06-09 ENCOUNTER — Other Ambulatory Visit: Payer: Self-pay | Admitting: Radiology

## 2019-06-09 ENCOUNTER — Telehealth: Payer: Self-pay | Admitting: *Deleted

## 2019-06-09 ENCOUNTER — Other Ambulatory Visit: Payer: Self-pay

## 2019-06-09 VITALS — BP 140/92 | HR 107 | Temp 98.3°F | Resp 20 | Ht 67.0 in | Wt 206.6 lb

## 2019-06-09 DIAGNOSIS — C3490 Malignant neoplasm of unspecified part of unspecified bronchus or lung: Secondary | ICD-10-CM

## 2019-06-09 DIAGNOSIS — C7951 Secondary malignant neoplasm of bone: Secondary | ICD-10-CM | POA: Insufficient documentation

## 2019-06-09 DIAGNOSIS — K219 Gastro-esophageal reflux disease without esophagitis: Secondary | ICD-10-CM | POA: Diagnosis not present

## 2019-06-09 DIAGNOSIS — Z79899 Other long term (current) drug therapy: Secondary | ICD-10-CM | POA: Diagnosis not present

## 2019-06-09 DIAGNOSIS — G629 Polyneuropathy, unspecified: Secondary | ICD-10-CM | POA: Insufficient documentation

## 2019-06-09 DIAGNOSIS — Z510 Encounter for antineoplastic radiation therapy: Secondary | ICD-10-CM | POA: Diagnosis not present

## 2019-06-09 DIAGNOSIS — G893 Neoplasm related pain (acute) (chronic): Secondary | ICD-10-CM | POA: Diagnosis not present

## 2019-06-09 DIAGNOSIS — C3491 Malignant neoplasm of unspecified part of right bronchus or lung: Secondary | ICD-10-CM | POA: Insufficient documentation

## 2019-06-09 DIAGNOSIS — C7931 Secondary malignant neoplasm of brain: Secondary | ICD-10-CM | POA: Insufficient documentation

## 2019-06-09 MED ORDER — HYDROMORPHONE HCL 2 MG PO TABS: ORAL_TABLET | ORAL | 0 refills | Status: DC

## 2019-06-09 MED ORDER — GABAPENTIN 600 MG PO TABS: ORAL_TABLET | ORAL | 0 refills | Status: DC

## 2019-06-09 NOTE — Patient Instructions (Signed)
Constipation Management  Magnesium Citrate, drink 1/2 bottle, drink remainder if no bowel movement with 30 to 60 minutes  Or  30 mg (1 tablespoon) of Milk of Magnesia in 8 ounces of prune juice, warm in microwave for 20 seconds    Begin the following after you have had a bowel movement:  Senna-S, 1 to 2 tablets twice daily  MiraLAX 17 grams in 8 ounces of liquids 1 to 2 times daily as needed   Remember to remain well hydrated. Drink, Drink, Drink non-caffeinated beverages.   Adjust these medications based on your response. If your bowel movements become too loose then decrease the amount of Senna-S and/or MiraLAX that you are using. If your bowel movements become too firm or are difficult to pass, the increase the amount of Senna-S and/or MiraLAX that you are using and increase your intake of water.   NEVER, NEVER, NEVER use an enema or suppositories unless your provider has given their approval.

## 2019-06-09 NOTE — Progress Notes (Signed)
Pharmacist Chemotherapy Monitoring - Initial Assessment    Anticipated start date: 06/15/19   Regimen:  . Are orders appropriate based on the patient's diagnosis, regimen, and cycle? Yes . Does the plan date match the patient's scheduled date? Yes . Is the sequencing of drugs appropriate? Yes . Are the premedications appropriate for the patient's regimen? Yes . Prior Authorization for treatment is: Pending o If applicable, is the correct biosimilar selected based on the patient's insurance? not applicable  Organ Function and Labs: Marland Kitchen Are dose adjustments needed based on the patient's renal function, hepatic function, or hematologic function? Yes . Are appropriate labs ordered prior to the start of patient's treatment? Yes . Other organ system assessment, if indicated: N/A . The following baseline labs, if indicated, have been ordered: N/A  Dose Assessment: . Are the drug doses appropriate? Yes . Are the following correct: o Drug concentrations Yes o IV fluid compatible with drug Yes o Administration routes Yes o Timing of therapy Yes . If applicable, does the patient have documented access for treatment and/or plans for port-a-cath placement? yes . If applicable, have lifetime cumulative doses been properly documented and assessed? yes Lifetime Dose Tracking  No doses have been documented on this patient for the following tracked chemicals: Doxorubicin, Epirubicin, Idarubicin, Daunorubicin, Mitoxantrone, Bleomycin, Oxaliplatin, Carboplatin, Liposomal Doxorubicin  o   Toxicity Monitoring/Prevention: . The patient has the following take home antiemetics prescribed: Ondansetron, Dexamethasone and Lorazepam . The patient has the following take home medications prescribed: folic acid for pemetrexed . Medication allergies and previous infusion related reactions, if applicable, have been reviewed and addressed. No . The patient's current medication list has been assessed for drug-drug  interactions with their chemotherapy regimen. no significant drug-drug interactions were identified on review.  Order Review: . Are the treatment plan orders signed? Yes . Is the patient scheduled to see a provider prior to their treatment? Yes  I verify that I have reviewed each item in the above checklist and answered each question accordingly.  Romualdo Bolk [facility] 06/09/2019 11:17 AM

## 2019-06-09 NOTE — Progress Notes (Signed)
[facility] Progress Note   William Ali [id number] Jun 09, 1985 34 y.o.  William Ali is managed by Dr. Narda Rutherford and Dr. Cecil Cobbs  Actively treated with chemotherapy/immunotherapy/hormonal therapy: The patient is receiving whole brain radiation and radiation to his spine.  Next scheduled appointment with provider: 06/15/2019.  Assessment: Plan:    Neoplasm related pain - Plan: HYDROmorphone (DILAUDID) 2 MG tablet  Peripheral polyneuropathy - Plan: gabapentin (NEURONTIN) 600 MG tablet  Adenocarcinoma of lung, unspecified laterality ([place])  Metastatic adenocarcinoma to brain ([facility])  Gastroesophageal reflux disease, unspecified whether esophagitis present   Neoplasm related pain: [name] was told to stop his OxyContin and was begun on Dilaudid 2 mg, 1 to 2 tablets every 4-6 hours.  He was given information on a bowel regiment to begin.  He was offered IV fluids and IV Dilaudid today which she declined.  Peripheral neuropathy: He was begun on gabapentin 600 mg p.o. nightly with instructions to increase to twice daily dosing if needed.  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  He is seeing [name] on 06/12/2019 and Dr. Lorenso Courier on 06/15/2019.  Reflux: He was instructed to increase his pantoprazole to twice daily dosing.  Right ear popping: He was told to begin Afrin nasal spray 2 sprays in each nostril twice daily for no more than 3 days.  Please see After Visit Summary for patient specific instructions.  Future Appointments  Date Time Provider [place]  06/12/2019 10:30 AM [name], MD [facility] None  06/12/2019 12:00 PM [facility] ROOM [facility] None  06/12/2019  1:30 PM [facility] 1 [facility] [place]  06/12/2019  3:00 PM [facility] LINAC 4 [facility] None  06/13/2019 11:00 AM [facility] LINAC 3 [facility] None  06/13/2019 12:00 PM  [facility] CHEMO EDU [facility] None  06/14/2019 11:00 AM [facility] LINAC 3 [facility] None  06/15/2019 10:15 AM [facility] LAB/FLUSH [facility] None  06/15/2019 10:30 AM [facility] [place] FLUSH [facility] None  06/15/2019 11:00 AM Orson Slick, MD [facility] None  06/15/2019 12:15 PM [facility] INFUSION [facility] None    No orders of the defined types were placed in this encounter.      Subjective:   Patient ID:  William Ali is a 34 y.o. (DOB [birth year]) male.  Chief Complaint: No chief complaint on file.   HPI William Ali  is a 34 y.o. 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  He is currently undergoing radiation therapy to the whole brain and to the mets in his spine.  He continues to have significant photophobia and right head pain which is not improved with his use of OxyContin.  He reports only taking OxyContin at night and only taking 2 tablets at that time.  He reports that he has previously been given Dilaudid when he was in the hospital which worked well for him.  He has significant numbness and pain in his lower extremities which he reports is believed to be secondary to tumor pressing on a nerve.  He reports GERD-like symptoms despite his use of pantoprazole once daily.  He also reports that he is having some constipation, episodic wheezing, dry mouth and has noted popping and his right ear.  Medications: I have reviewed the patient's current medications.  Allergies:  Allergies  Allergen Reactions   Onion Anaphylaxis   Other  All sea food causes swelling and a rash.     Peanut Oil Itching   Peanut-Containing Drug Products Itching    Past Medical History:  Diagnosis Date   Back pain    Cancer ([place])    Hypertension     Past Surgical History:  Procedure Laterality Date   KNEE SURGERY      Family History  Problem Relation Age of Onset   Hypertension Mother    Diabetes Father      Social History   Socioeconomic History   Marital status: Married    Spouse name: Not on file   Number of children: Not on file   Years of education: Not on file   Highest education level: Not on file  Occupational History   Not on file  Tobacco Use   Smoking status: Never Smoker   Smokeless tobacco: Never Used  Substance and Sexual Activity   Alcohol use: No   Drug use: No   Sexual activity: Yes  Other Topics Concern   Not on file  Social History Narrative   Not on file   Social Determinants of Health   Financial Resource Strain:    Difficulty of Paying Living Expenses:   Food Insecurity:    Worried About Charity fundraiser in the Last Year:    Arboriculturist in the Last Year:   Transportation Needs:    Film/video editor (Medical):    Lack of Transportation (Non-Medical):   Physical Activity:    Days of Exercise per Week:    Minutes of Exercise per Session:   Stress:    Feeling of Stress :   Social Connections:    Frequency of Communication with Friends and Family:    Frequency of Social Gatherings with Friends and Family:    Attends Religious Services:    Active Member of Clubs or Organizations:    Attends Music therapist:    Marital Status:   Intimate Partner Violence:    Fear of Current or Ex-Partner:    Emotionally Abused:    Physically Abused:    Sexually Abused:     Past Medical History, Surgical history, Social history, and Family history were reviewed and updated as appropriate.   Please see review of systems for further details on the patient's review from today.   Review of Systems:  Review of Systems  Constitutional: Negative for appetite change, chills, diaphoresis, fever and unexpected weight change.  HENT: Negative for trouble swallowing.        Right ear popping Dry mouth  Eyes: Positive for photophobia.  Respiratory: Positive for cough and wheezing. Negative for shortness of breath.    Cardiovascular: Negative for chest pain.  Gastrointestinal: Positive for constipation. Negative for abdominal distention, abdominal pain, anal bleeding, blood in stool, diarrhea, nausea, rectal pain and vomiting.  Neurological: Positive for numbness and headaches.    Objective:   Physical Exam:  BP (!) 140/92 (BP Location: Right Arm, Patient Position: Sitting) Comment: notified nurse   Pulse (!) 107 Comment: notified nurse   Temp 98.3 F (36.8 C) (Temporal)    Resp 20    Ht 5\' 7"  (1.702 m)    Wt 93.7 kg (206 lb 9.6 oz)    SpO2 100%    BMI 32.36 kg/m  ECOG: 1  Physical Exam Constitutional:      General: He is not in acute distress.    Appearance: He is not diaphoretic.     Comments: The  patient is an adult male who appears to be in a moderate amount of discomfort.  HENT:     Head: Normocephalic and atraumatic.     Right Ear: Tympanic membrane, ear canal and external ear normal.     Left Ear: Tympanic membrane, ear canal and external ear normal.     Mouth/Throat:     Pharynx: No oropharyngeal exudate.  Eyes:     General: No scleral icterus.       Right eye: No discharge.        Left eye: No discharge.  Cardiovascular:     Rate and Rhythm: Regular rhythm. Tachycardia present.     Heart sounds: Normal heart sounds. No murmur. No friction rub. No gallop.   Pulmonary:     Effort: Pulmonary effort is normal. No respiratory distress.     Breath sounds: Normal breath sounds. No wheezing or rales.  Musculoskeletal:     Cervical back: Normal range of motion and neck supple.     Right lower leg: No edema.     Left lower leg: No edema.  Lymphadenopathy:     Cervical: No cervical adenopathy.  Skin:    General: Skin is warm and dry.     Findings: No erythema or rash.  Neurological:     Mental Status: He is alert.     Coordination: Coordination normal.     Gait: Gait abnormal (The patient is ambulating with the use of a wheelchair.).  Psychiatric:        Mood and Affect: Mood  normal.        Behavior: Behavior normal.        Thought Content: Thought content normal.        Judgment: Judgment normal.     Lab Review:     Component Value Date/Time   NA 135 06/04/2019 0725   K 5.2 (H) 06/04/2019 0725   CL 98 06/04/2019 0725   CO2 23 06/04/2019 0725   GLUCOSE 92 06/04/2019 0725   BUN 39 (H) 06/04/2019 0725   CREATININE 1.47 (H) 06/04/2019 0725   CALCIUM 9.3 06/04/2019 0725   PROT 7.7 06/04/2019 0725   ALBUMIN 3.7 06/04/2019 0725   AST 71 (H) 06/04/2019 0725   ALT 190 (H) 06/04/2019 0725   ALKPHOS 514 (H) 06/04/2019 0725   BILITOT 1.4 (H) 06/04/2019 0725   GFRNONAA >60 06/04/2019 0725   GFRAA >60 06/04/2019 0725       Component Value Date/Time   WBC 14.0 (H) 06/04/2019 0725   RBC 5.33 06/04/2019 0725   HGB 14.6 06/04/2019 0725   HCT 45.7 06/04/2019 0725   PLT 114 (L) 06/04/2019 0725   MCV 85.7 06/04/2019 0725   MCH 27.4 06/04/2019 0725   MCHC 31.9 06/04/2019 0725   RDW 16.3 (H) 06/04/2019 0725   LYMPHSABS 1.0 06/04/2019 0725   MONOABS 0.6 06/04/2019 0725   EOSABS 0.1 06/04/2019 0725   BASOSABS 0.0 06/04/2019 0725   -------------------------------  Imaging from last 24 hours (if applicable):  Radiology interpretation: DG Tibia/Fibula Left  Result Date: 06/04/2019 CLINICAL DATA:  Leg pain EXAM: LEFT TIBIA AND FIBULA - 2 VIEW COMPARISON:  None. FINDINGS: No fracture of the tibia or fibula. Knee joint and ankle joint appear normal on two views. The lateral tibial plateau appears irregular however on the comparison femur exam same day the lateral tibial plateau appears normal. IMPRESSION: No fracture or dislocation. No aggressive osseous lesion identified. Electronically Signed   By: Helane Gunther.D.  On: 06/04/2019 09:00   CT Head Wo Contrast  Result Date: 06/04/2019 CLINICAL DATA:  Worsening headaches. Additional history provided: Recently diagnosed with metastatic lung cancer. EXAM: CT HEAD WITHOUT CONTRAST TECHNIQUE: Contiguous axial  images were obtained from the base of the skull through the vertex without intravenous contrast. COMPARISON:  Brain MRI 05/10/2019, head CT 09/13/2012 FINDINGS: Brain: Numerous small enhancing parenchymal lesions were demonstrated on prior MRI 05/10/2019. Some of these are appreciated on today's examination but many of these are occult by CT. Several of these lesions were hemorrhagic on the prior MRI. There is a 3 mm focus of hyperdensity within the left occipital lobe consistent with redemonstrated hemorrhage associated with a lesion at this site (series 2, image 17). No definitively new lesion is identified. No demarcated cortical infarct. No extra-axial fluid collection. No evidence of intracranial mass. No midline shift. Vascular: No hyperdense vessel. Skull: No calvarial fracture. Suspected metastases within the skull base and upper cervical spine were better appreciated on prior MRI. Sinuses/Orbits: No acute orbital abnormality by CT. Please note subtle enhancement along the posterior left globe with suspected on prior MRI. Ethmoid and sphenoid sinus mucosal thickening. Right sphenoid sinus air-fluid level. No significant mastoid effusion IMPRESSION: Numerous small parenchymal lesions (likely metastases) were demonstrated on prior MRI 05/10/2019. Some of these lesions are appreciated on today's examination, but many of these are occult by CT. There is a 3 mm focus of hyperdensity consistent with hemorrhage at site of a known left occipital lobe lesion. However, this is unchanged and hemorrhage was present at this site on prior MRI. No definitively new lesion or interval acute intracranial abnormality is identified. Suspected metastases within the skull base and upper cervical spine were better appreciated on prior MRI. Paranasal sinus mucosal thickening with right sphenoid sinus air-fluid level. Correlate for acute sinusitis. Electronically Signed   By: Kellie Simmering DO   On: 06/04/2019 08:44   CT Chest W  Contrast  Result Date: 05/10/2019 CLINICAL DATA:  Cancer of unknown primary, worsening headaches, neck and back pain with numerous intracranial lesions worrisome for metastatic disease EXAM: CT CHEST, ABDOMEN, AND PELVIS WITH CONTRAST TECHNIQUE: Multidetector CT imaging of the chest, abdomen and pelvis was performed following the standard protocol during bolus administration of intravenous contrast. CONTRAST:  139mL OMNIPAQUE IOHEXOL 300 MG/ML  SOLN COMPARISON:  Numerous comparison radiographs of the chest, most recently 05/06/2019 FINDINGS: CT CHEST FINDINGS Cardiovascular: The aortic root is suboptimally assessed given cardiac pulsation artifact. The aorta is normal caliber without acute luminal or periaortic abnormality. Normal 3 vessel branching of the aortic arch. Proximal great vessels opacified normally. Central pulmonary arteries are normal caliber. No large central filling defects are identified on this non tailored examination of the pulmonary arteries. Normal heart size. No pericardial effusion. Mediastinum/Nodes: Multiple right paratracheal and hilar lymph nodes are present with some low central attenuation which may suggest necrosis, representative nodes include a 12 mm low right paratracheal node (4R, 3/74), 10 mm low right precarinal node (4R, 2/21), and a a 19 mm right hilar lymph node (10R, 2/44). Lungs/Pleura: In the right lung apex is a 3.0 x 3.0 x 2.8 cm lesion with spiculated margins extending to both the pleural surface and superior right hilum. Additional subsided sub 5 mm nodules are seen in the left lung apex (6/40, 6/56), superior segment left lower lobe (6/59). No consolidation. No convincing features of edema. No pneumothorax or effusion. Musculoskeletal: There are numerous lytic appearing lesions throughout the thoracic spine involving both the  vertebral bodies and posterior elements of multiple levels. Notably, a hypoattenuating lesion in the L3 vertebral body demonstrates a  superimposed pathologic compression deformity with approximately 30% height loss. No worrisome chest wall lesion. CT ABDOMEN PELVIS FINDINGS Hepatobiliary: There are innumerable peripherally enhancing centrally hypoattenuating lesions seen throughout the liver. The largest lesion is seen within the caudate lobe measuring up to 2.8 cm in size (2/47). An additional 2.4 cm lesion in the left lobe liver (2/52). Additional geographic region of hyperattenuation in the periphery of the right upper lobe, may reflect a transient hepatic attenuation difference though certainly an underlying lesion in this location is not excluded. Gallbladder is unremarkable. No calcified gallstones. No biliary ductal dilatation. Pancreas: Unremarkable. No pancreatic ductal dilatation or surrounding inflammatory changes. Spleen: Normal in size without focal abnormality. Adrenals/Urinary Tract: Adrenal glands are unremarkable. Kidneys are normal, without renal calculi, focal lesion, or hydronephrosis. Bladder is unremarkable. High attenuation material within the urinary bladder likely related to recent contrast enhanced brain MRI. Stomach/Bowel: Distal esophagus, stomach and duodenal sweep are unremarkable. No small bowel wall thickening or dilatation. A normal appendix is visualized. No colonic dilatation or wall thickening. No evidence of obstruction. Vascular/Lymphatic: No significant vascular findings are present. No enlarged abdominal or pelvic lymph nodes. Reproductive: The prostate and seminal vesicles are unremarkable. Other: No abdominopelvic free fluid or air. Small fat containing inguinal hernias. No bowel containing hernia. Musculoskeletal: There are numerous lucent lesions within the vertebral bodies and transverse processes of the lumbar spine compatible with osseous metastatic disease. Additional lucent lesions present within both ilia including a lesion in the left iliac wing which demonstrates some soft tissue extension  (2/90). IMPRESSION: 1. Thick-walled cavitary lesion in the left lung apex with spiculated margins extending to both the pleural surface and superior right hilum, concerning for primary lung malignancy. Appearance could suggest a squamous cell carcinoma the lung given the location and cavitation though is by no means a diagnostic finding. 2. Multiple additional contralateral sub 5 mm lymph nodes, could be infectious or inflammatory though should be certainly viewed with suspicion and for metastatic disease given other findings throughout the body. 3. Numerous necrotic appearing is lateral mediastinal and right hilar lymph nodes, likely metastatic. 4. Innumerable rim enhancing hepatic lesions throughout both lobes of the liver compatible with solid organ metastasis. Additional geographic region of hyperattenuation in the periphery of the right upper lobe, may reflect a transient hepatic attenuation difference though certainly an underlying lesion in this location is not excluded. Attention on follow-up recommended. 5. There are multiple lytic appearing lesions throughout the thoracic and lumbar spine as well as portions of the bony pelvis. 6. Soft tissue extension of an osseous lesion involving the left iliac wing. 7. Pathologic split type compression fracture of the T3 vertebral body with approximately 30% height loss. These results were called by telephone at the time of interpretation on 05/10/2019 at 10:05 pm to provider GARRETT GREEN , who verbally acknowledged these results. Electronically Signed   By: Lovena Le M.D.   On: 05/10/2019 22:05   MR BRAIN W CONTRAST  Result Date: 05/10/2019 CLINICAL DATA:  Left retinal lesion, headaches EXAM: MRI HEAD AND ORBITS WITHOUT AND WITH CONTRAST TECHNIQUE: Multiplanar, multiecho pulse sequences of the brain and surrounding structures were obtained without and with intravenous contrast. Multiplanar, multiecho pulse sequences of the orbits and surrounding structures were  obtained including fat saturation techniques, before and after intravenous contrast administration. CONTRAST:  23mL GADAVIST GADOBUTROL 1 MMOL/ML IV SOLN COMPARISON:  None. FINDINGS: Intravenous contrast was injected for MRI of the lumbar spine shortly before this study. Therefore, contrast remains present and all images are effectively postcontrast MRI HEAD FINDINGS Brain: There are multiple (greater than 30) subcentimeter parenchymal enhancing lesions including involvement of the cerebellum and right midbrain tectum. There is mild edema associated with some of the lesions. A minority also demonstrate corresponding susceptibility, which may reflect intralesional hemorrhage or mineralization. There is no acute infarction. No hydrocephalus. No significant mass effect. Vascular: Major vessel flow voids at the skull base are preserved. Skull and upper cervical spine: Probable areas of abnormal marrow enhancement at the skull base and C2 posterior elements Other: Mastoid air cells are clear. MRI ORBITS FINDINGS Orbits: No proptosis. Suspected thin enhancing tissue at the posterior aspect of the left globe. Extraocular muscles are symmetric and unremarkable. There is no abnormal enhancement of the optic nerve sheath complexes. Visualized sinuses: Minor mucosal thickening. Soft tissues: Unremarkable. IMPRESSION: Numerous enhancing parenchymal intracranial lesions likely reflecting metastases. Mild associated edema. No mass effect. Probable areas of abnormal marrow enhancement reflecting osseous metastases at the skull base and upper cervical spine. Suspected abnormal thin enhancing tissue at the posterior aspect of the left lobe. Electronically Signed   By: Macy Mis M.D.   On: 05/10/2019 19:29   CT ABDOMEN PELVIS W CONTRAST  Result Date: 05/10/2019 CLINICAL DATA:  Cancer of unknown primary, worsening headaches, neck and back pain with numerous intracranial lesions worrisome for metastatic disease EXAM: CT CHEST,  ABDOMEN, AND PELVIS WITH CONTRAST TECHNIQUE: Multidetector CT imaging of the chest, abdomen and pelvis was performed following the standard protocol during bolus administration of intravenous contrast. CONTRAST:  156mL OMNIPAQUE IOHEXOL 300 MG/ML  SOLN COMPARISON:  Numerous comparison radiographs of the chest, most recently 05/06/2019 FINDINGS: CT CHEST FINDINGS Cardiovascular: The aortic root is suboptimally assessed given cardiac pulsation artifact. The aorta is normal caliber without acute luminal or periaortic abnormality. Normal 3 vessel branching of the aortic arch. Proximal great vessels opacified normally. Central pulmonary arteries are normal caliber. No large central filling defects are identified on this non tailored examination of the pulmonary arteries. Normal heart size. No pericardial effusion. Mediastinum/Nodes: Multiple right paratracheal and hilar lymph nodes are present with some low central attenuation which may suggest necrosis, representative nodes include a 12 mm low right paratracheal node (4R, 3/74), 10 mm low right precarinal node (4R, 2/21), and a a 19 mm right hilar lymph node (10R, 2/44). Lungs/Pleura: In the right lung apex is a 3.0 x 3.0 x 2.8 cm lesion with spiculated margins extending to both the pleural surface and superior right hilum. Additional subsided sub 5 mm nodules are seen in the left lung apex (6/40, 6/56), superior segment left lower lobe (6/59). No consolidation. No convincing features of edema. No pneumothorax or effusion. Musculoskeletal: There are numerous lytic appearing lesions throughout the thoracic spine involving both the vertebral bodies and posterior elements of multiple levels. Notably, a hypoattenuating lesion in the L3 vertebral body demonstrates a superimposed pathologic compression deformity with approximately 30% height loss. No worrisome chest wall lesion. CT ABDOMEN PELVIS FINDINGS Hepatobiliary: There are innumerable peripherally enhancing centrally  hypoattenuating lesions seen throughout the liver. The largest lesion is seen within the caudate lobe measuring up to 2.8 cm in size (2/47). An additional 2.4 cm lesion in the left lobe liver (2/52). Additional geographic region of hyperattenuation in the periphery of the right upper lobe, may reflect a transient hepatic attenuation difference though certainly an underlying lesion in  this location is not excluded. Gallbladder is unremarkable. No calcified gallstones. No biliary ductal dilatation. Pancreas: Unremarkable. No pancreatic ductal dilatation or surrounding inflammatory changes. Spleen: Normal in size without focal abnormality. Adrenals/Urinary Tract: Adrenal glands are unremarkable. Kidneys are normal, without renal calculi, focal lesion, or hydronephrosis. Bladder is unremarkable. High attenuation material within the urinary bladder likely related to recent contrast enhanced brain MRI. Stomach/Bowel: Distal esophagus, stomach and duodenal sweep are unremarkable. No small bowel wall thickening or dilatation. A normal appendix is visualized. No colonic dilatation or wall thickening. No evidence of obstruction. Vascular/Lymphatic: No significant vascular findings are present. No enlarged abdominal or pelvic lymph nodes. Reproductive: The prostate and seminal vesicles are unremarkable. Other: No abdominopelvic free fluid or air. Small fat containing inguinal hernias. No bowel containing hernia. Musculoskeletal: There are numerous lucent lesions within the vertebral bodies and transverse processes of the lumbar spine compatible with osseous metastatic disease. Additional lucent lesions present within both ilia including a lesion in the left iliac wing which demonstrates some soft tissue extension (2/90). IMPRESSION: 1. Thick-walled cavitary lesion in the left lung apex with spiculated margins extending to both the pleural surface and superior right hilum, concerning for primary lung malignancy. Appearance  could suggest a squamous cell carcinoma the lung given the location and cavitation though is by no means a diagnostic finding. 2. Multiple additional contralateral sub 5 mm lymph nodes, could be infectious or inflammatory though should be certainly viewed with suspicion and for metastatic disease given other findings throughout the body. 3. Numerous necrotic appearing is lateral mediastinal and right hilar lymph nodes, likely metastatic. 4. Innumerable rim enhancing hepatic lesions throughout both lobes of the liver compatible with solid organ metastasis. Additional geographic region of hyperattenuation in the periphery of the right upper lobe, may reflect a transient hepatic attenuation difference though certainly an underlying lesion in this location is not excluded. Attention on follow-up recommended. 5. There are multiple lytic appearing lesions throughout the thoracic and lumbar spine as well as portions of the bony pelvis. 6. Soft tissue extension of an osseous lesion involving the left iliac wing. 7. Pathologic split type compression fracture of the T3 vertebral body with approximately 30% height loss. These results were called by telephone at the time of interpretation on 05/10/2019 at 10:05 pm to provider GARRETT GREEN , who verbally acknowledged these results. Electronically Signed   By: Lovena Le M.D.   On: 05/10/2019 22:05   US BIOPSY (LIVER)  Result Date: 05/17/2019 CLINICAL DATA:  Multiple liver lesions worrisome for metastatic disease EXAM: ULTRASOUND-GUIDED CORE LIVER LESION BIOPSY TECHNIQUE: An ultrasound guided liver biopsy was thoroughly discussed with the patient and questions were answered. The benefits, risks, alternatives, and complications were also discussed. The patient understands and wishes to proceed with the procedure. A verbal as well as written consent was obtained. Survey ultrasound of the liver was performed, a representative lesion identified, and an appropriate skin entry  site was determined. Skin site was marked, prepped with chlorhexidine, and draped in usual sterile fashion, and infiltrated locally with 1% lidocaine. Intravenous Fentanyl 159mcg and Versed 2mg  were administered as conscious sedation during continuous monitoring of the patient's level of consciousness and physiological / cardiorespiratory status by the radiology RN, with a total moderate sedation time of 12 minutes. a 62 gauge trocar needle was advanced under ultrasound guidance into the liver to the margin of the lesion. 3 coaxial 18gauge core samples were then obtained through the guide needle. The guide needle was removed. Post  procedure scans demonstrate no apparent complication. COMPLICATIONS: COMPLICATIONS None immediate FINDINGS: Multiple hypoechoic lesions were identified corresponding to CT findings. Representative core biopsy samples obtained as above. IMPRESSION: 1. Technically successful ultrasound guided core liver lesion biopsy. Electronically Signed   By: Lucrezia Europe M.D.   On: 05/17/2019 07:14   DG Chest Portable 1 View  Result Date: 06/02/2019 CLINICAL DATA:  Hervey Ard right-sided chest pain EXAM: PORTABLE CHEST 1 VIEW COMPARISON:  May 06, 2019 FINDINGS: The previously demonstrated right upper lobe pulmonary nodule is better visualized on the patient's prior CT chest and prior x-rays. There is no pneumothorax. No significant pleural effusion. No evidence for an acute osseous abnormality. The heart size is normal. There is some mild thickening of the right paratracheal stripe which may be secondary to underlying mediastinal adenopathy. IMPRESSION: 1. Mild thickening of the right paratracheal stripe which may be secondary to underlying mediastinal adenopathy. 2. The previously demonstrated right upper lobe pulmonary nodule is better visualized on the patient's prior CT chest and prior x-rays. 3. No acute cardiopulmonary process. Electronically Signed   By: Constance Holster M.D.   On: 06/02/2019  17:55   DG Femur Min 2 Views Left  Result Date: 06/04/2019 CLINICAL DATA:  Lower extremity pain. EXAM: LEFT FEMUR 2 VIEWS COMPARISON:  None. FINDINGS: Frontal and lateral views were obtained. No fracture or dislocation. No abnormal periosteal reaction. No blastic or lytic bone lesions. There is moderate degenerative change in the knee joint. No appreciable knee joint effusion. IMPRESSION: No blastic or lytic bone lesions. No abnormal periosteal reaction. No fracture or dislocation. 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 Electronically Signed   By: Lowella Grip III M.D.   On: 06/04/2019 08:55   VAS [place] LOWER EXTREMITY VENOUS (DVT) (ONLY [facility] & [facility])  Result Date: 06/04/2019  Lower Venous DVTStudy Indications: Swelling.  Risk Factors: None identified. Limitations: Poor ultrasound/tissue interface. Comparison Study: No prior studies. Performing Technologist: Oliver Hum RVT  Examination Guidelines: A complete evaluation includes B-mode imaging, spectral Doppler, color Doppler, and power Doppler as needed of all accessible portions of each vessel. Bilateral testing is considered an integral part of a complete examination. Limited examinations for reoccurring indications may be performed as noted. The reflux portion of the exam is performed with the patient in reverse Trendelenburg.  +-----+---------------+---------+-----------+----------+--------------+  RIGHT Compressibility Phasicity Spontaneity Properties Thrombus Aging  +-----+---------------+---------+-----------+----------+--------------+  CFV   Full            Yes       Yes                                    +-----+---------------+---------+-----------+----------+--------------+   +---------+---------------+---------+-----------+----------+--------------+  LEFT      Compressibility Phasicity Spontaneity Properties Thrombus Aging  +---------+---------------+---------+-----------+----------+--------------+  CFV       Full            Yes       Yes                                     +---------+---------------+---------+-----------+----------+--------------+  SFJ       Full                                                             +---------+---------------+---------+-----------+----------+--------------+  FV Prox   Full                                                             +---------+---------------+---------+-----------+----------+--------------+  FV Mid    Full                                                             +---------+---------------+---------+-----------+----------+--------------+  FV Distal Full                                                             +---------+---------------+---------+-----------+----------+--------------+  PFV       Full                                                             +---------+---------------+---------+-----------+----------+--------------+  POP       None            No        No                     Acute           +---------+---------------+---------+-----------+----------+--------------+  PTV       None                                             Acute           +---------+---------------+---------+-----------+----------+--------------+  PERO      None                                             Acute           +---------+---------------+---------+-----------+----------+--------------+  Gastroc   None                                             Acute           +---------+---------------+---------+-----------+----------+--------------+     Summary: RIGHT: - No evidence of common femoral vein obstruction.  LEFT: - Findings consistent with acute deep vein thrombosis involving the left popliteal vein, left posterior tibial veins, left peroneal veins, and left gastrocnemius veins. - No cystic structure found in the popliteal fossa.  *See table(s) above for measurements and observations. Electronically signed by Deitra Mayo MD on 06/04/2019 at 12:00:49 PM.    Final    MR ORBITS W  CONTRAST  Result Date: 05/10/2019 CLINICAL DATA:  Left retinal lesion, headaches EXAM: MRI HEAD AND ORBITS WITHOUT AND WITH CONTRAST TECHNIQUE: Multiplanar, multiecho pulse sequences of the brain and surrounding structures were obtained without and with intravenous contrast. Multiplanar, multiecho pulse sequences of the orbits and surrounding structures were obtained including fat saturation techniques, before and after intravenous contrast administration. CONTRAST:  64mL GADAVIST GADOBUTROL 1 MMOL/ML IV SOLN COMPARISON:  None. FINDINGS: Intravenous contrast was injected for MRI of the lumbar spine shortly before this study. Therefore, contrast remains present and all images are effectively postcontrast MRI HEAD FINDINGS Brain: There are multiple (greater than 30) subcentimeter parenchymal enhancing lesions including involvement of the cerebellum and right midbrain tectum. There is mild edema associated with some of the lesions. A minority also demonstrate corresponding susceptibility, which may reflect intralesional hemorrhage or mineralization. There is no acute infarction. No hydrocephalus. No significant mass effect. Vascular: Major vessel flow voids at the skull base are preserved. Skull and upper cervical spine: Probable areas of abnormal marrow enhancement at the skull base and C2 posterior elements Other: Mastoid air cells are clear. MRI ORBITS FINDINGS Orbits: No proptosis. Suspected thin enhancing tissue at the posterior aspect of the left globe. Extraocular muscles are symmetric and unremarkable. There is no abnormal enhancement of the optic nerve sheath complexes. Visualized sinuses: Minor mucosal thickening. Soft tissues: Unremarkable. IMPRESSION: Numerous enhancing parenchymal intracranial lesions likely reflecting metastases. Mild associated edema. No mass effect. Probable areas of abnormal marrow enhancement reflecting osseous metastases at the skull base and upper cervical spine. Suspected  abnormal thin enhancing tissue at the posterior aspect of the left lobe. Electronically Signed   By: Macy Mis M.D.   On: 05/10/2019 19:29

## 2019-06-09 NOTE — Telephone Encounter (Signed)
Received call from patient stating that he is feeling more SOB and wheezing more. He is on  Albuterol inhaler and states it is not working well for him and relieving his wheezing and SOB. He also states he is having difficulty swallowing and that food is slow going down and gets caught or 'hung up'  No problem with fluids but foods like chicken etc. Get caught and are slow to move through his espohagus/stomach.. Advised that we could see him in the [facility] this afternoon either before or after his radiation treatment. He prefers before his treatment (which is scheduled for 1:30) as he does not feel very good after his radiation treatment. He is agreeable to being seen in [facility].  Sandi Mealy, PA made aware. High priority scheduling message sent.

## 2019-06-12 ENCOUNTER — Inpatient Hospital Stay (HOSPITAL_BASED_OUTPATIENT_CLINIC_OR_DEPARTMENT_OTHER): Payer: Medicaid Other | Admitting: Internal Medicine

## 2019-06-12 ENCOUNTER — Encounter (HOSPITAL_COMMUNITY): Payer: Self-pay

## 2019-06-12 ENCOUNTER — Other Ambulatory Visit: Payer: Self-pay | Admitting: Hematology and Oncology

## 2019-06-12 ENCOUNTER — Ambulatory Visit (HOSPITAL_COMMUNITY)
Admission: RE | Admit: 2019-06-12 | Discharge: 2019-06-12 | Disposition: A | Discharge status: Home / Self Care | Payer: PRIVATE HEALTH INSURANCE | Source: Ambulatory Visit | Attending: Hematology and Oncology | Admitting: Hematology and Oncology

## 2019-06-12 ENCOUNTER — Ambulatory Visit: Admission: RE | Admit: 2019-06-12 | Payer: PRIVATE HEALTH INSURANCE | Source: Ambulatory Visit

## 2019-06-12 ENCOUNTER — Other Ambulatory Visit: Payer: Self-pay

## 2019-06-12 DIAGNOSIS — C771 Secondary and unspecified malignant neoplasm of intrathoracic lymph nodes: Secondary | ICD-10-CM

## 2019-06-12 DIAGNOSIS — Z7901 Long term (current) use of anticoagulants: Secondary | ICD-10-CM | POA: Insufficient documentation

## 2019-06-12 DIAGNOSIS — R079 Chest pain, unspecified: Secondary | ICD-10-CM | POA: Diagnosis not present

## 2019-06-12 DIAGNOSIS — Z79899 Other long term (current) drug therapy: Secondary | ICD-10-CM | POA: Insufficient documentation

## 2019-06-12 DIAGNOSIS — C3491 Malignant neoplasm of unspecified part of right bronchus or lung: Secondary | ICD-10-CM

## 2019-06-12 DIAGNOSIS — C7931 Secondary malignant neoplasm of brain: Secondary | ICD-10-CM

## 2019-06-12 DIAGNOSIS — I2699 Other pulmonary embolism without acute cor pulmonale: Secondary | ICD-10-CM | POA: Diagnosis not present

## 2019-06-12 DIAGNOSIS — I10 Essential (primary) hypertension: Secondary | ICD-10-CM | POA: Insufficient documentation

## 2019-06-12 HISTORY — PX: IR IMAGING GUIDED PORT INSERTION: IMG5740

## 2019-06-12 IMAGING — XA IR IMAGING GUIDED PORT INSERTION
2 series · 2 of 2 positions shown · non-contrast
Comparison: none

INDICATION: 33-year-old male with a history of metastatic right upper lobe lung
cancer. He presents for port catheter placement.

[Series 1: (id) · 1 of 1 slices shown]
[im 1/1]
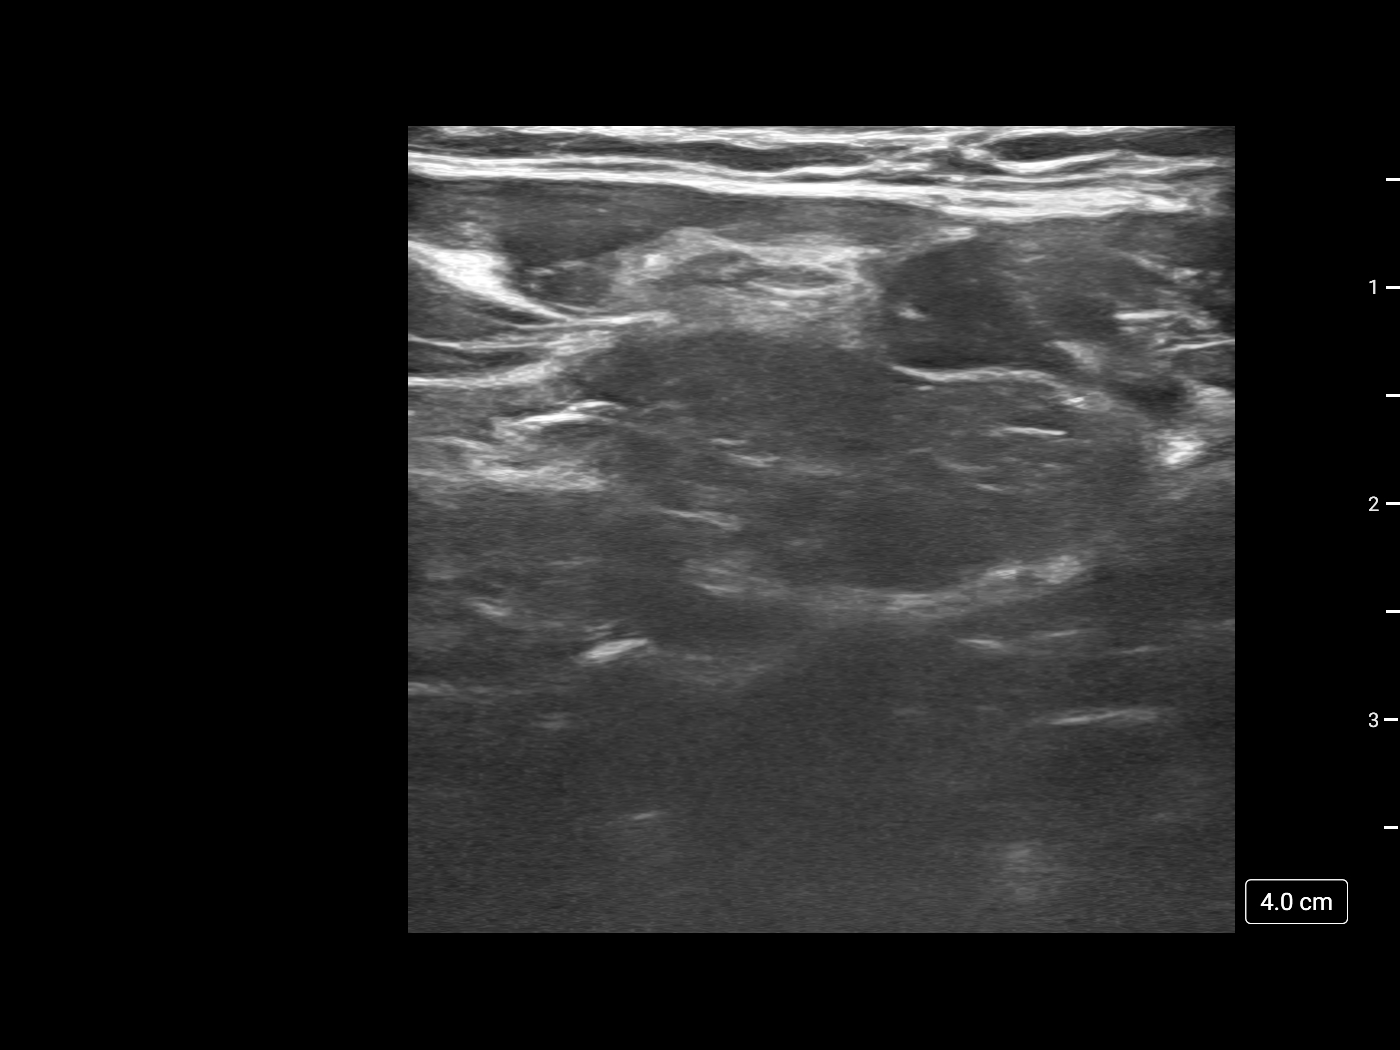

[Series 300: ir imaging guided port insertion · 1 of 1 slices shown]
[im 1/1]
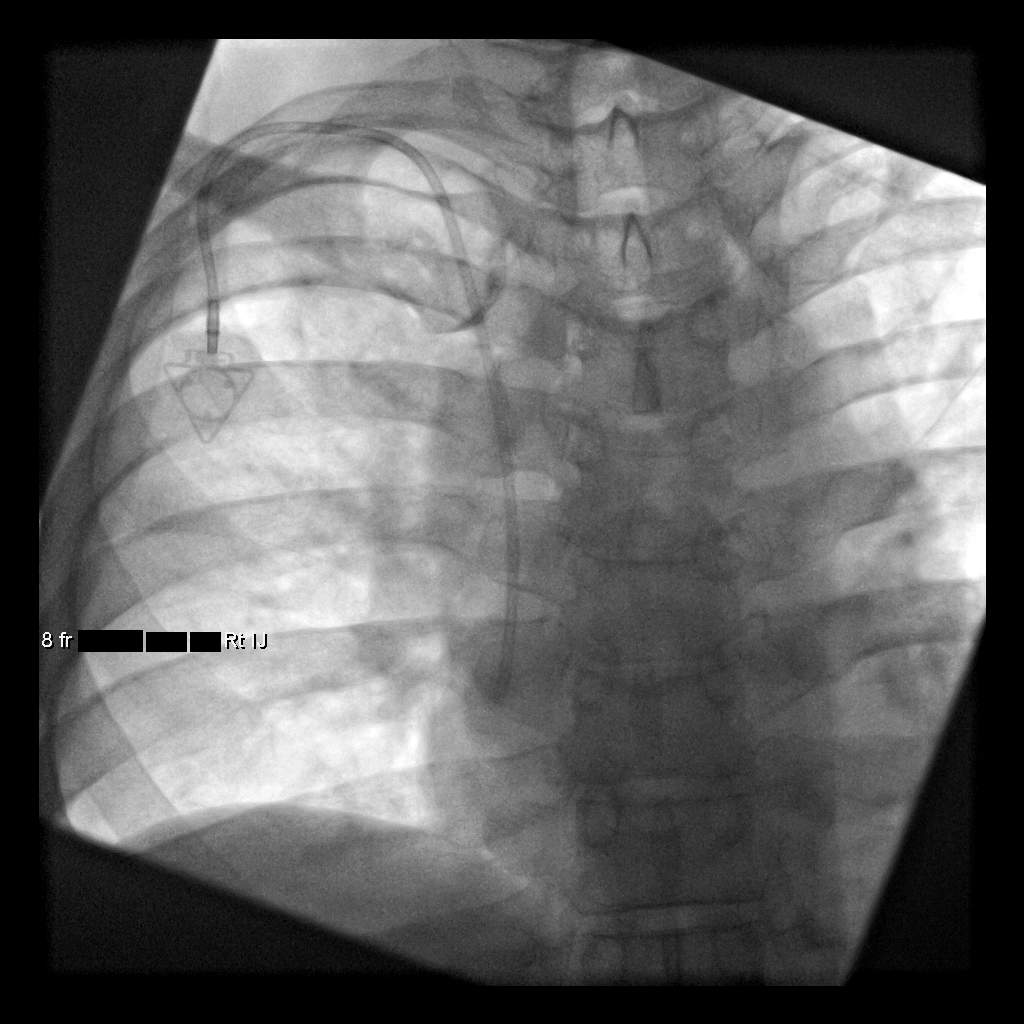

[2 of 2 positions shown; findings below may reference images not displayed]

EXAM:
IMPLANTED PORT A CATH PLACEMENT WITH ULTRASOUND AND FLUOROSCOPIC
GUIDANCE

MEDICATIONS:
Ancef 2 g; The antibiotic was administered within an appropriate
time interval prior to skin puncture.

ANESTHESIA/SEDATION:
Versed 4 mg IV; Fentanyl 100 mcg IV;

Moderate Sedation Time:  19 minutes

The patient was continuously monitored during the procedure by the
interventional radiology nurse under my direct supervision.

FLUOROSCOPY TIME:  0 minutes, 30 seconds (7 mGy)

COMPLICATIONS:
None immediate.

PROCEDURE:
The right neck and chest was prepped with chlorhexidine, and draped
in the usual sterile fashion using maximum barrier technique (cap
and mask, sterile gown, sterile gloves, large sterile sheet, hand
hygiene and cutaneous antiseptic). Local anesthesia was attained by
infiltration with 1% lidocaine with epinephrine.

Ultrasound demonstrated patency of the right internal jugular vein,
and this was documented with an image. Under real-time ultrasound
guidance, this vein was accessed with a 21 gauge micropuncture
needle and image documentation was performed. A small dermatotomy
was made at the access site with an 11 scalpel. A 0.018" wire was
advanced into the SVC and the access needle exchanged for a 4F
micropuncture vascular sheath. The 0.018" wire was then removed and
a 0.035" wire advanced into the IVC.



The venous access site was then serially dilated and a peel away
vascular sheath placed over the wire. The wire was removed and the
port catheter advanced into position under fluoroscopic guidance.
The catheter tip is positioned in the superior cavoatrial junction.
This was documented with a spot image. The portacatheter was then
tested and found to flush and aspirate well. The port was flushed
with saline followed by 100 units/mL heparinized saline.

The pocket was then closed in two layers using first subdermal
inverted interrupted absorbable sutures followed by a running
subcuticular suture. The epidermis was then sealed with Dermabond.
The dermatotomy at the venous access site was also closed with
Dermabond.
IMPRESSION: Successful placement of a right IJ approach Power Port with
ultrasound and fluoroscopic guidance. The catheter is ready for use.

## 2019-06-12 MED ORDER — LIDOCAINE HCL (PF) 1 % IJ SOLN
INTRAMUSCULAR | Status: AC | PRN
  Administered 2019-06-12: 5 mL

## 2019-06-12 MED ORDER — LIDOCAINE-EPINEPHRINE 1 %-1:100000 IJ SOLN
INTRAMUSCULAR | Status: AC
  Filled 2019-06-12: qty 1

## 2019-06-12 MED ORDER — MIDAZOLAM HCL 2 MG/2ML IJ SOLN
INTRAMUSCULAR | Status: AC | PRN
  Administered 2019-06-12 (×4): 1 mg via INTRAVENOUS

## 2019-06-12 MED ORDER — MIDAZOLAM HCL 2 MG/2ML IJ SOLN
INTRAMUSCULAR | Status: AC
  Filled 2019-06-12: qty 4

## 2019-06-12 MED ORDER — FENTANYL CITRATE (PF) 100 MCG/2ML IJ SOLN
INTRAMUSCULAR | Status: AC | PRN
  Administered 2019-06-12 (×2): 50 ug via INTRAVENOUS

## 2019-06-12 MED ORDER — SODIUM CHLORIDE 0.9 % IV SOLN: INTRAVENOUS | Status: DC

## 2019-06-12 MED ORDER — CEFAZOLIN SODIUM-DEXTROSE 2-4 GM/100ML-% IV SOLN
INTRAVENOUS | Status: AC
  Filled 2019-06-12: qty 100

## 2019-06-12 MED ORDER — LIDOCAINE-EPINEPHRINE (PF) 1 %-1:200000 IJ SOLN
INTRAMUSCULAR | Status: AC | PRN
  Administered 2019-06-12: 10 mL

## 2019-06-12 MED ORDER — HEPARIN SOD (PORK) LOCK FLUSH 100 UNIT/ML IV SOLN
INTRAVENOUS | Status: AC
  Filled 2019-06-12: qty 5

## 2019-06-12 MED ORDER — FENTANYL CITRATE (PF) 100 MCG/2ML IJ SOLN
INTRAMUSCULAR | Status: AC
  Filled 2019-06-12: qty 4

## 2019-06-12 MED ORDER — CEFAZOLIN SODIUM-DEXTROSE 2-4 GM/100ML-% IV SOLN
2.0000 g | INTRAVENOUS | Status: AC
  Administered 2019-06-12: 2 g via INTRAVENOUS

## 2019-06-12 NOTE — Progress Notes (Signed)
Discharge instructions reviewed with his wife, Cindra Eves. Patient returns from [facility] insertion and taken to Radiation Oncology for his appt.

## 2019-06-12 NOTE — Discharge Instructions (Signed)
DO NOT USE LIDOCAINE CREAM ON THE SKIN GLUE. IT WILL DISSOLVE THE SKIN GLUE.  Wait until the cancer center nurses tell you it has healed. Use ice in a zip lock bag for 2-3 minutes before they access your new port.    Implanted Port Insertion, Care After This sheet gives you information about how to care for yourself after your procedure. Your health care provider may also give you more specific instructions. If you have problems or questions, contact your health care provider. What can I expect after the procedure? After the procedure, it is common to have:  Discomfort at the port insertion site.  Bruising on the skin over the port. This should improve over 3-4 days. Follow these instructions at home: [facility] care  After your port is placed, you will get a manufacturer's information card. The card has information about your port. Keep this card with you at all times.  Take care of the port as told by your health care provider. Ask your health care provider if you or a family member can get training for taking care of the port at home. A home health care nurse may also take care of the port.  Make sure to remember what type of port you have. Incision care      Follow instructions from your health care provider about how to take care of your port insertion site. Make sure you: ? Wash your hands with soap and water before and after you change your bandage (dressing). If soap and water are not available, use hand sanitizer. ? Change your dressing as told by your health care provider. ? Leave  skin glue in place. These skin closures may need to stay in place for 2 weeks or longer.   Check your port insertion site every day for signs of infection. Check for: ? Redness, swelling, or pain. ? Fluid or blood. ? Warmth. ? Pus or a bad smell. Activity  Return to your normal activities as told by your health care provider. Ask your health care provider what activities are safe for you.  Do not  lift anything that is heavier than 10 lb (4.5 kg), or the limit that you are told, until your health care provider says that it is safe. General instructions  Take over-the-counter and prescription medicines only as told by your health care provider.  Do not take baths, swim, or use a hot tub until your health care provider approves. You may shower 06/13/19 at 3 PM.   Do not drive for 24 hours if you were given a sedative during your procedure.  Wear a medical alert bracelet in case of an emergency. This will tell any health care providers that you have a port.  Keep all follow-up visits as told by your health care provider. This is important. Contact a health care provider if:  You cannot flush your port with saline as directed, or you cannot draw blood from the port.  You have a fever or chills.  You have redness, swelling, or pain around your port insertion site.  You have fluid or blood coming from your port insertion site.  Your port insertion site feels warm to the touch.  You have pus or a bad smell coming from the port insertion site. Get help right away if:  You have chest pain or shortness of breath.  You have bleeding from your port that you cannot control. Summary  Take care of the port as told by your health  care provider. Keep the manufacturer's information card with you at all times.  Change your dressing as told by your health care provider.  Contact a health care provider if you have a fever or chills or if you have redness, swelling, or pain around your port insertion site.  Keep all follow-up visits as told by your health care provider. This information is not intended to replace advice given to you by your health care provider. Make sure you discuss any questions you have with your health care provider. Document Revised: 08/10/2017 Document Reviewed: 08/10/2017 Elsevier Patient Education  Hand.      Moderate Conscious Sedation, Adult,  Care After These instructions provide you with information about caring for yourself after your procedure. Your health care provider may also give you more specific instructions. Your treatment has been planned according to current medical practices, but problems sometimes occur. Call your health care provider if you have any problems or questions after your procedure. What can I expect after the procedure? After your procedure, it is common:  To feel sleepy for several hours.  To feel clumsy and have poor balance for several hours.  To have poor judgment for several hours.  To vomit if you eat too soon. Follow these instructions at home: For at least 24 hours after the procedure:   Do not: ? Participate in activities where you could fall or become injured. ? Drive. ? Use heavy machinery. ? Drink alcohol. ? Take sleeping pills or medicines that cause drowsiness. ? Make important decisions or sign legal documents. ? Take care of children on your own.  Rest. Eating and drinking  Follow the diet recommended by your health care provider.  If you vomit: ? Drink water, juice, or soup when you can drink without vomiting. ? Make sure you have little or no nausea before eating solid foods. General instructions  Have a responsible adult stay with you until you are awake and alert.  Take over-the-counter and prescription medicines only as told by your health care provider.  If you smoke, do not smoke without supervision.  Keep all follow-up visits as told by your health care provider. This is important. Contact a health care provider if:  You keep feeling nauseous or you keep vomiting.  You feel light-headed.  You develop a rash.  You have a fever. Get help right away if:  You have trouble breathing. This information is not intended to replace advice given to you by your health care provider. Make sure you discuss any questions you have with your health care  provider. Document Revised: 12/25/2016 Document Reviewed: 05/04/2015 Elsevier Patient Education  2020 [employer].

## 2019-06-12 NOTE — Progress Notes (Signed)
I connected with William Ali on 06/12/19 at 10:30 AM EDT by telephone visit and verified that I am speaking with the correct person using two identifiers.  I discussed the limitations, risks, security and privacy concerns of performing an evaluation and management service by telemedicine and the availability of in-person appointments. I also discussed with the patient that there may be a patient responsible charge related to this service. The patient expressed understanding and agreed to proceed.  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  Headaches remain problematic and recurrent, however.  Currently dosing decadron 8mg  daily.  Undergoing WBRT without issue. Observations: Language and cognition stable Assessment and Plan: Recommended decreasing decadron to 4mg  daily x5 days, then 2mg  daily x5 days, then 1mg  daily x5 days, then stop.  Use analgesia to treat headaches rather than corticosteroids. Follow Up Instructions: RTC in 3 months following post WBRT MRI brain study, or sooner if needed  I discussed the assessment and treatment plan with the patient.  The patient was provided an opportunity to ask questions and all were answered.  The patient agreed with the plan and demonstrated understanding of the instructions.    The patient was advised to call back or seek an in-person evaluation if the symptoms worsen or if the condition fails to improve as anticipated.  I provided 5-10 minutes of non-face-to-face time during this enocunter.  William Sellers, MD   I provided 15 minutes of non face-to-face telephone visit time during this encounter, and > 50% was spent counseling as documented under my assessment & plan.

## 2019-06-12 NOTE — Procedures (Signed)
Interventional Radiology Procedure Note  Procedure: Placement of a right IJ approach single lumen PowerPort.  Tip is positioned at the superior cavoatrial junction and catheter is ready for immediate use.  Complications: No immediate Recommendations:  - Ok to shower tomorrow - Do not submerge for 7 days - Routine line care   Signed,  Aaryanna Hyden K. Ashtynn Berke, MD   

## 2019-06-12 NOTE — Progress Notes (Signed)
Unable to obtain las after 4 sticks. Verbal order from Dr Laurence Ferrari to discontinue labs. [facility] as patient has 3 PM appointment today. Waiting to receive phone call back from them as to whether patient will have appt today with them.

## 2019-06-12 NOTE — H&P (Signed)
Referring Physician(s): Dorsey,John T IV  Supervising Physician: Jacqulynn Cadet  Patient Status:  [facility]  Chief Complaint: "I'm here for a port a cath"   Subjective: Pt familiar to IR service from liver lesion bx on 05/17/19. He has a hx of recently diagnosed metastatic lung adenocarcinoma. He has poor venous access and presents again today for port a cath placement for chemotherapy. He denies fever, dyspnea, bleeding. He does have occ HA's/intermittent CP, occ cough, intermittent abd/back pain along with some occ N/V.   Past Medical History:  Diagnosis Date  . Back pain   . Cancer ([place])   . Hypertension    Past Surgical History:  Procedure Laterality Date  . KNEE SURGERY         Allergies: Onion, Other, Peanut oil, and Peanut-containing drug products  Medications: Prior to Admission medications   Medication Sig Start Date End Date Taking? Authorizing Provider  albuterol (VENTOLIN HFA) 108 (90 Base) MCG/ACT inhaler Inhale 2 puffs into the lungs every 6 (six) hours as needed for wheezing or shortness of breath. 05/19/19  Yes Orson Slick, MD  dexamethasone (DECADRON) 4 MG tablet Take 2 tablets (8 mg total) by mouth daily. 05/19/19  Yes Orson Slick, MD  dexamethasone (DECADRON) 4 MG tablet Take 1 tablet (4 mg total) by mouth 2 (two) times daily with a meal. Starting on 5/11, and drop dose to once daily on 5/14 06/06/19  Yes Hayden Pedro, PA-C  hydrochlorothiazide (HYDRODIURIL) 25 MG tablet Take 1 tablet (25 mg total) by mouth daily. 07/21/18  Yes Lamptey, Myrene Galas, MD  HYDROmorphone (DILAUDID) 2 MG tablet 1 to 2 tablets q 4 hours prn pain. 06/09/19  Yes Tanner, Lyndon Code., PA-C  pantoprazole (PROTONIX) 40 MG tablet Take 40 mg by mouth daily. 04/11/19  Yes [provider]  benzonatate (TESSALON) 100 MG capsule Take 1 capsule (100 mg total) by mouth every 8 (eight) hours. Patient not taking: Reported on 05/25/2019 05/05/19   Darr, Marguerita Beards, PA-C   cetirizine (ZYRTEC) 10 MG tablet Take 1 tablet (10 mg total) by mouth daily. Patient not taking: Reported on 06/02/2019 11/23/18   Augusto Gamble B, NP  gabapentin (NEURONTIN) 600 MG tablet 1 PO QHS, may increase to 1 PO BID if needed. 06/09/19   Tanner, Lyndon Code., PA-C  LORazepam (ATIVAN) 0.5 MG tablet 1 tab po q 4-6 hours prn or 1 tab po 30 minutes prior to radiation Patient not taking: Reported on 06/02/2019 05/25/19   Hayden Pedro, PA-C  ondansetron (ZOFRAN) 8 MG tablet Take 1 tablet (8 mg total) by mouth every 8 (eight) hours as needed for nausea or vomiting. Patient not taking: Reported on 06/02/2019 05/30/19   Hayden Pedro, PA-C  RIVAROXABAN Alveda Reasons) VTE STARTER PACK (15 & 20 MG TABLETS) Follow package directions: Take one 15mg  tablet by mouth twice a day. On day 22, switch to one 20mg  tablet once a day. Take with food. 06/04/19   Dorie Rank, MD  sodium chloride (OCEAN) 0.65 % SOLN nasal spray Place 1 spray into both nostrils as needed for congestion. Patient not taking: Reported on 06/02/2019 05/05/19   Darr, Marguerita Beards, PA-C  amLODipine (NORVASC) 5 MG tablet Take 1 tablet (5 mg total) by mouth daily. 04/21/18 07/21/18  Robyn Haber, MD  famotidine (PEPCID) 20 MG tablet Take 1 tablet (20 mg total) by mouth 2 (two) times daily. Patient taking differently: Take 20 mg by mouth 2 (two) times daily as  needed for heartburn.  04/21/18 07/21/18  Robyn Haber, MD     Vital Signs: BP (!) 148/98 (BP Location: Right Arm)   Pulse (!) 104   Temp 98.2 F (36.8 C) (Oral)   Resp 20   Ht 5\' 7"  (1.702 m)   Wt 206 lb 9.1 oz (93.7 kg)   SpO2 100%   BMI 32.35 kg/m   Physical Exam awake/alert: chest- CTA bilat; heart- RRR; abd- soft,+BS, mild diffuse tenderness; no LE edema  Imaging: No results found.  Labs:  CBC: Recent Labs    05/10/19 1400 05/16/19 1224 06/02/19 1730 06/04/19 0725  WBC 11.9* 13.9* 19.4* 14.0*  HGB 13.1 14.2 14.9 14.6  HCT 44.5 44.2 46.8 45.7  PLT 325 393 148*  114*    COAGS: Recent Labs    05/16/19 1224  INR 1.1    BMP: Recent Labs    05/10/19 1400 06/02/19 1730 06/04/19 0725  NA 139 137 135  K 4.5 5.1 5.2*  CL 100 101 98  CO2 27 22 23   GLUCOSE 99 104* 92  BUN 15 51* 39*  CALCIUM 9.8 9.3 9.3  CREATININE 1.37* 1.55* 1.47*  GFRNONAA >60 58* >60  GFRAA >60 >60 >60    LIVER FUNCTION TESTS: Recent Labs    05/10/19 1400 06/02/19 1730 06/04/19 0725  BILITOT 1.0 1.1 1.4*  AST 51* 67* 71*  ALT 66* 179* 190*  ALKPHOS 261* 500* 514*  PROT 8.5* 7.4 7.7  ALBUMIN 4.3 3.7 3.7    Assessment and Plan: Pt with hx newly diagnosed metastatic lung adenocarcinoma; has poor venous access and presents today for port a cath placement for chemotherapy. Risks and benefits of image guided port-a-catheter placement was discussed with the patient including, but not limited to bleeding, infection, pneumothorax, or fibrin sheath development and need for additional procedures.  All of the patient's questions were answered, patient is agreeable to proceed. Consent signed and in chart.  LABS PENDING  Electronically Signed: D. Rowe Robert, PA-C 06/12/2019, 1:35 PM   I spent a total of 25 minutes at the the patient's bedside AND on the patient's hospital floor or unit, greater than 50% of which was counseling/coordinating care for port a cath placement

## 2019-06-13 ENCOUNTER — Emergency Department (HOSPITAL_COMMUNITY): Payer: PRIVATE HEALTH INSURANCE

## 2019-06-13 ENCOUNTER — Ambulatory Visit: Payer: PRIVATE HEALTH INSURANCE

## 2019-06-13 ENCOUNTER — Other Ambulatory Visit: Payer: Self-pay

## 2019-06-13 ENCOUNTER — Telehealth: Payer: Self-pay

## 2019-06-13 ENCOUNTER — Telehealth: Payer: Self-pay | Admitting: *Deleted

## 2019-06-13 ENCOUNTER — Encounter (HOSPITAL_COMMUNITY): Payer: Self-pay

## 2019-06-13 ENCOUNTER — Inpatient Hospital Stay (HOSPITAL_COMMUNITY)
Admission: EM | Admit: 2019-06-13 | Discharge: 2019-06-16 | DRG: 167 | Disposition: A | Discharge status: Home / Self Care | Payer: PRIVATE HEALTH INSURANCE | Attending: Internal Medicine | Admitting: Internal Medicine

## 2019-06-13 ENCOUNTER — Inpatient Hospital Stay: Payer: Medicaid Other

## 2019-06-13 DIAGNOSIS — C787 Secondary malignant neoplasm of liver and intrahepatic bile duct: Secondary | ICD-10-CM | POA: Diagnosis present

## 2019-06-13 DIAGNOSIS — Z91013 Allergy to seafood: Secondary | ICD-10-CM

## 2019-06-13 DIAGNOSIS — C7951 Secondary malignant neoplasm of bone: Secondary | ICD-10-CM | POA: Diagnosis present

## 2019-06-13 DIAGNOSIS — R079 Chest pain, unspecified: Secondary | ICD-10-CM | POA: Diagnosis present

## 2019-06-13 DIAGNOSIS — Z7901 Long term (current) use of anticoagulants: Secondary | ICD-10-CM | POA: Diagnosis not present

## 2019-06-13 DIAGNOSIS — E669 Obesity, unspecified: Secondary | ICD-10-CM | POA: Diagnosis present

## 2019-06-13 DIAGNOSIS — I2694 Multiple subsegmental pulmonary emboli without acute cor pulmonale: Secondary | ICD-10-CM

## 2019-06-13 DIAGNOSIS — G893 Neoplasm related pain (acute) (chronic): Secondary | ICD-10-CM | POA: Diagnosis present

## 2019-06-13 DIAGNOSIS — Z91010 Allergy to peanuts: Secondary | ICD-10-CM

## 2019-06-13 DIAGNOSIS — Z7952 Long term (current) use of systemic steroids: Secondary | ICD-10-CM

## 2019-06-13 DIAGNOSIS — I2699 Other pulmonary embolism without acute cor pulmonale: Principal | ICD-10-CM | POA: Diagnosis present

## 2019-06-13 DIAGNOSIS — Z91018 Allergy to other foods: Secondary | ICD-10-CM | POA: Diagnosis not present

## 2019-06-13 DIAGNOSIS — E872 Acidosis: Secondary | ICD-10-CM | POA: Diagnosis present

## 2019-06-13 DIAGNOSIS — I82432 Acute embolism and thrombosis of left popliteal vein: Secondary | ICD-10-CM | POA: Diagnosis present

## 2019-06-13 DIAGNOSIS — D696 Thrombocytopenia, unspecified: Secondary | ICD-10-CM | POA: Diagnosis present

## 2019-06-13 DIAGNOSIS — C7931 Secondary malignant neoplasm of brain: Secondary | ICD-10-CM | POA: Diagnosis present

## 2019-06-13 DIAGNOSIS — Z8249 Family history of ischemic heart disease and other diseases of the circulatory system: Secondary | ICD-10-CM

## 2019-06-13 DIAGNOSIS — Z6832 Body mass index (BMI) 32.0-32.9, adult: Secondary | ICD-10-CM

## 2019-06-13 DIAGNOSIS — C3490 Malignant neoplasm of unspecified part of unspecified bronchus or lung: Secondary | ICD-10-CM | POA: Diagnosis present

## 2019-06-13 DIAGNOSIS — Z79899 Other long term (current) drug therapy: Secondary | ICD-10-CM | POA: Diagnosis not present

## 2019-06-13 DIAGNOSIS — Z20822 Contact with and (suspected) exposure to covid-19: Secondary | ICD-10-CM | POA: Diagnosis present

## 2019-06-13 DIAGNOSIS — I82409 Acute embolism and thrombosis of unspecified deep veins of unspecified lower extremity: Secondary | ICD-10-CM

## 2019-06-13 DIAGNOSIS — Z9221 Personal history of antineoplastic chemotherapy: Secondary | ICD-10-CM

## 2019-06-13 DIAGNOSIS — R042 Hemoptysis: Secondary | ICD-10-CM | POA: Diagnosis present

## 2019-06-13 DIAGNOSIS — I10 Essential (primary) hypertension: Secondary | ICD-10-CM | POA: Diagnosis present

## 2019-06-13 DIAGNOSIS — E860 Dehydration: Secondary | ICD-10-CM | POA: Diagnosis present

## 2019-06-13 LAB — CBC WITH DIFFERENTIAL/PLATELET
Abs Immature Granulocytes: 0.25 10*3/uL — ABNORMAL HIGH (ref 0.00–0.07)
Basophils Absolute: 0 10*3/uL (ref 0.0–0.1)
Basophils Relative: 0 %
Eosinophils Absolute: 0.1 10*3/uL (ref 0.0–0.5)
Eosinophils Relative: 1 %
HCT: 39.9 % (ref 39.0–52.0)
Hemoglobin: 12.8 g/dL — ABNORMAL LOW (ref 13.0–17.0)
Immature Granulocytes: 5 %
Lymphocytes Relative: 4 %
Lymphs Abs: 0.2 10*3/uL — ABNORMAL LOW (ref 0.7–4.0)
MCH: 27.1 pg (ref 26.0–34.0)
MCHC: 32.1 g/dL (ref 30.0–36.0)
MCV: 84.5 fL (ref 80.0–100.0)
Monocytes Absolute: 0.5 10*3/uL (ref 0.1–1.0)
Monocytes Relative: 9 %
Neutro Abs: 4.5 10*3/uL (ref 1.7–7.7)
Neutrophils Relative %: 81 %
Platelets: 98 10*3/uL — ABNORMAL LOW (ref 150–400)
RBC: 4.72 MIL/uL (ref 4.22–5.81)
RDW: 16.1 % — ABNORMAL HIGH (ref 11.5–15.5)
WBC: 5.5 10*3/uL (ref 4.0–10.5)
nRBC: 0.5 % — ABNORMAL HIGH (ref 0.0–0.2)

## 2019-06-13 LAB — BASIC METABOLIC PANEL
Anion gap: 13 (ref 5–15)
BUN: 35 mg/dL — ABNORMAL HIGH (ref 6–20)
CO2: 24 mmol/L (ref 22–32)
Calcium: 9.2 mg/dL (ref 8.9–10.3)
Chloride: 102 mmol/L (ref 98–111)
Creatinine, Ser: 1.34 mg/dL — ABNORMAL HIGH (ref 0.61–1.24)
GFR calc Af Amer: >60 mL/min (ref >60)
GFR calc non Af Amer: >60 mL/min (ref >60)
Glucose, Bld: 110 mg/dL — ABNORMAL HIGH (ref 70–99)
Potassium: 3.8 mmol/L (ref 3.5–5.1)
Sodium: 139 mmol/L (ref 135–145)

## 2019-06-13 LAB — APTT: aPTT: 31 seconds (ref 24–36)

## 2019-06-13 LAB — HIV ANTIBODY (ROUTINE TESTING W REFLEX): HIV Screen 4th Generation wRfx: NONREACTIVE

## 2019-06-13 LAB — HEPATIC FUNCTION PANEL
ALT: 153 U/L — ABNORMAL HIGH (ref 0–44)
AST: 62 U/L — ABNORMAL HIGH (ref 15–41)
Albumin: 3.6 g/dL (ref 3.5–5.0)
Alkaline Phosphatase: 374 U/L — ABNORMAL HIGH (ref 38–126)
Bilirubin, Direct: 0.1 mg/dL (ref 0.0–0.2)
Indirect Bilirubin: 0.9 mg/dL (ref 0.3–0.9)
Total Bilirubin: 1 mg/dL (ref 0.3–1.2)
Total Protein: 7 g/dL (ref 6.5–8.1)

## 2019-06-13 LAB — SARS CORONAVIRUS 2 BY RT PCR (HOSPITAL ORDER, PERFORMED IN ~~LOC~~ HOSPITAL LAB): SARS Coronavirus 2: NEGATIVE

## 2019-06-13 LAB — TROPONIN I (HIGH SENSITIVITY)
Troponin I (High Sensitivity): 7 ng/L (ref <18)
Troponin I (High Sensitivity): 7 ng/L (ref <18)

## 2019-06-13 LAB — PROTIME-INR
INR: 1.4 — ABNORMAL HIGH (ref 0.8–1.2)
Prothrombin Time: 16.2 seconds — ABNORMAL HIGH (ref 11.4–15.2)

## 2019-06-13 LAB — LACTIC ACID, PLASMA
Lactic Acid, Venous: 2 mmol/L (ref 0.5–1.9)
Lactic Acid, Venous: 2.5 mmol/L (ref 0.5–1.9)

## 2019-06-13 LAB — HEPARIN LEVEL (UNFRACTIONATED): Heparin Unfractionated: 0.21 IU/mL — ABNORMAL LOW (ref 0.30–0.70)

## 2019-06-13 LAB — LIPASE, BLOOD: Lipase: 28 U/L (ref 11–51)

## 2019-06-13 IMAGING — CT CT ANGIO CHEST
2 of 6 series · 16 of 36 positions shown · IV contrast (OMNIPAQUE 350)
Comparison: Chest radiograph earlier this day. Most recent chest CT
[DATE]

CLINICAL DATA: Shortness of breath. Chest pain. Metastatic lung
carcinoma. Active chemotherapy and radiation.

EXAM:
CT ANGIOGRAPHY CHEST WITH CONTRAST
TECHNIQUE: Multidetector CT imaging of the chest was performed using the
standard protocol during bolus administration of intravenous
contrast. Multiplanar CT image reconstructions and MIPs were
obtained to evaluate the vascular anatomy.
CONTRAST:  100mL OMNIPAQUE IOHEXOL 350 MG/ML SOLN

[Series 6: thins · axial · 0.69mm/px · z∈[-267,-27]mm · 15 of 272 slices shown]
[im 16/272  lung]
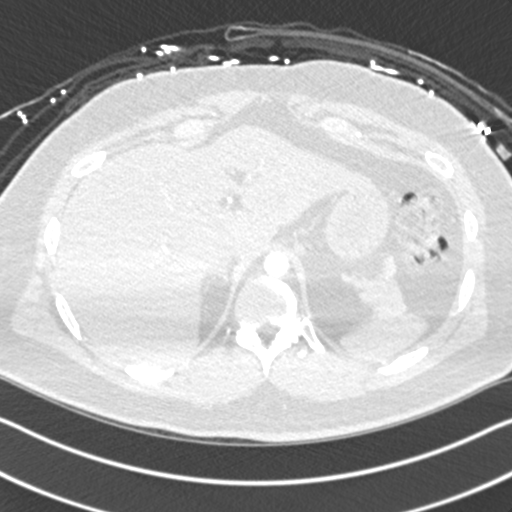
[im 31/272  mediastinal]
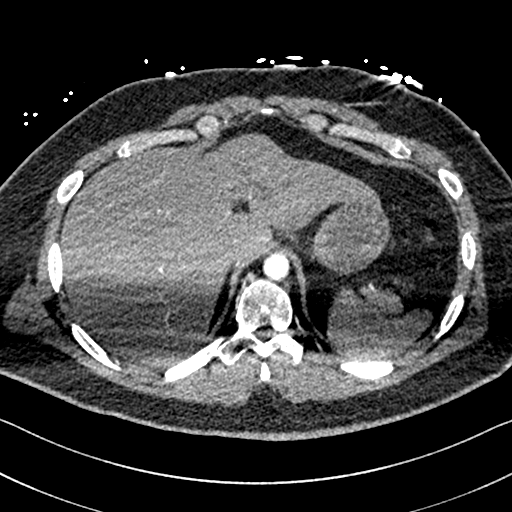
[im 46/272  lung]
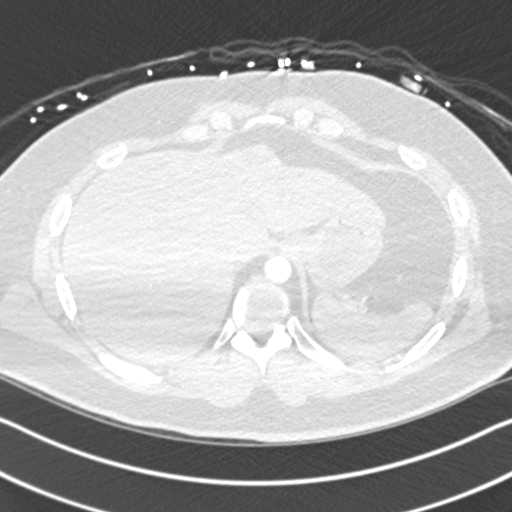
[im 61/272  mediastinal]
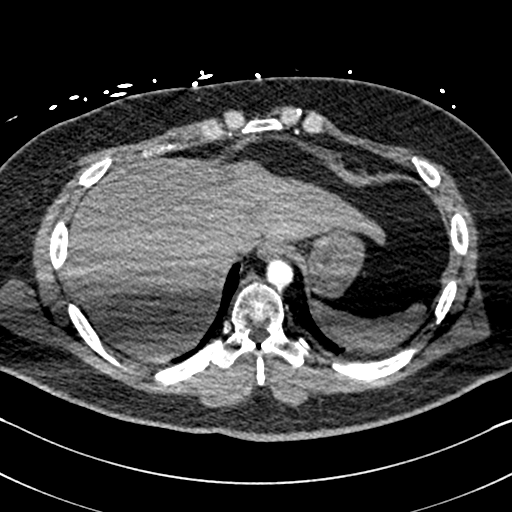
[im 91/272  lung]
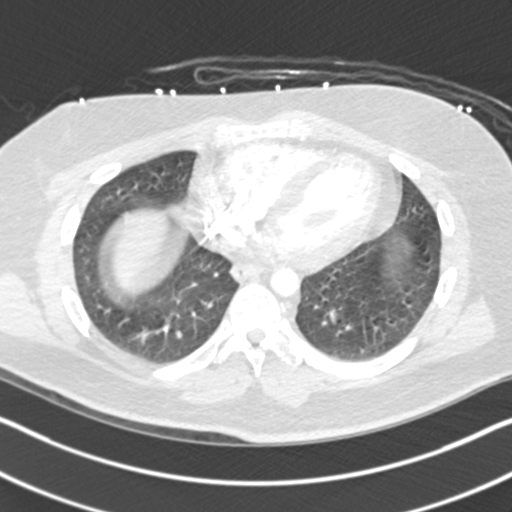
[im 106/272  mediastinal]
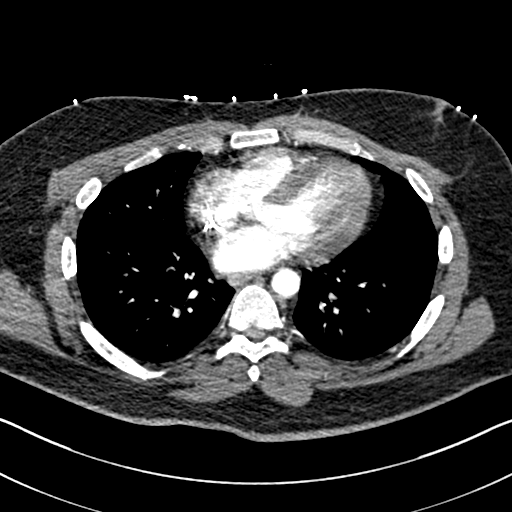
[im 121/272  lung]
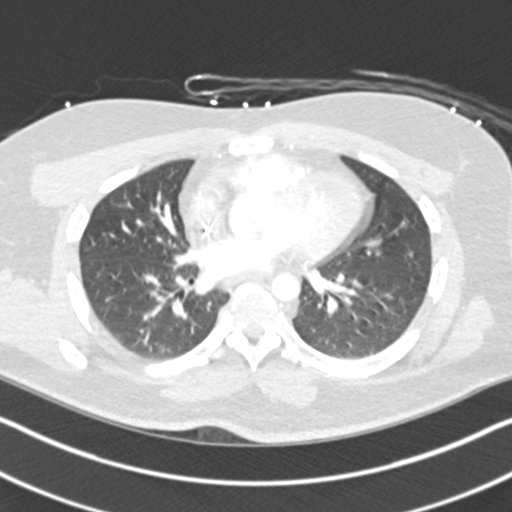
[im 136/272  mediastinal]
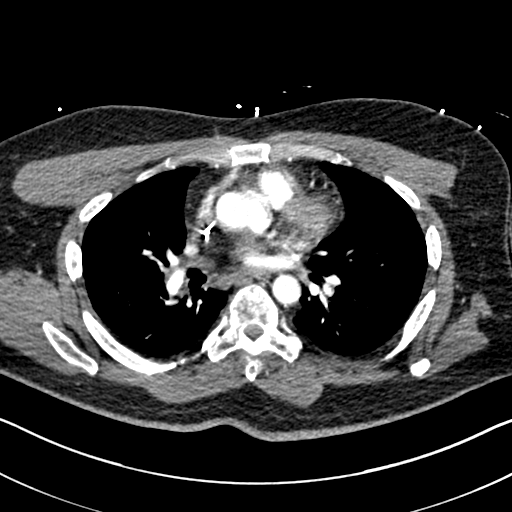
[im 151/272  lung]
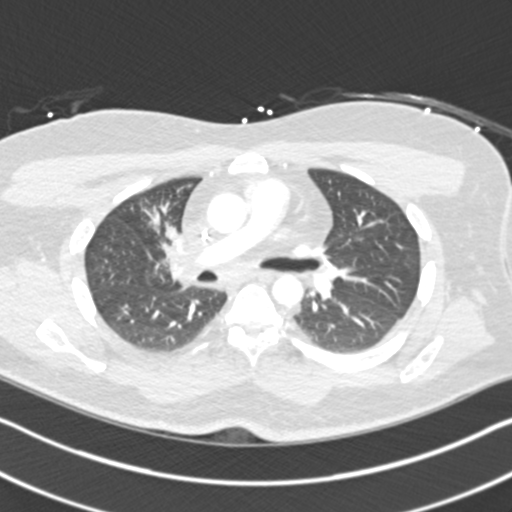
[im 166/272  mediastinal]
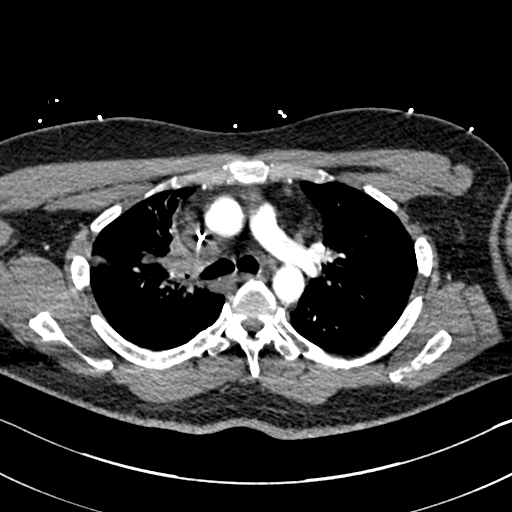
[im 181/272  lung]
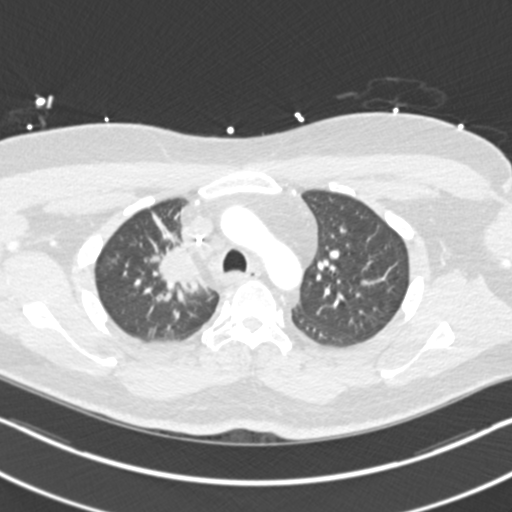
[im 211/272  mediastinal]
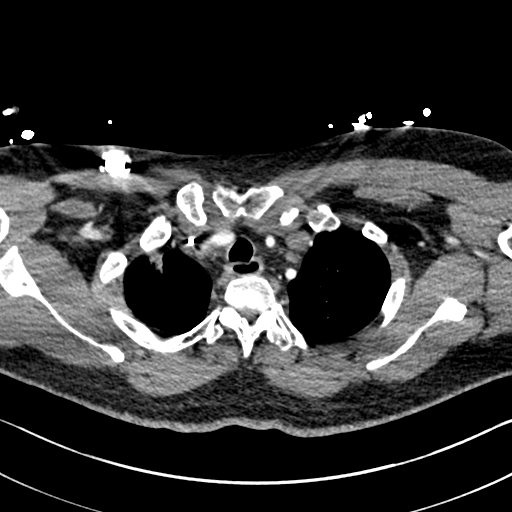
[im 226/272  lung]
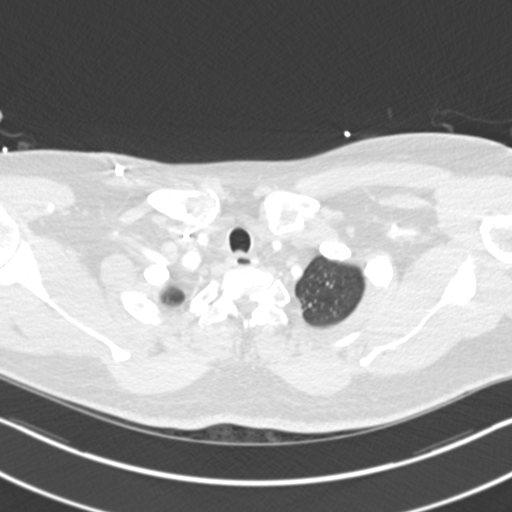
[im 241/272  mediastinal]
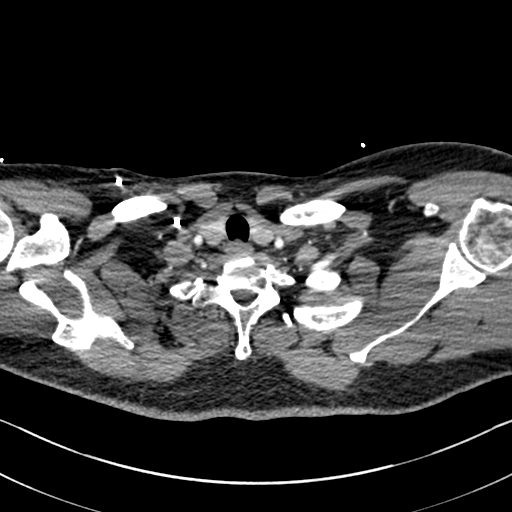
[im 256/272  lung]
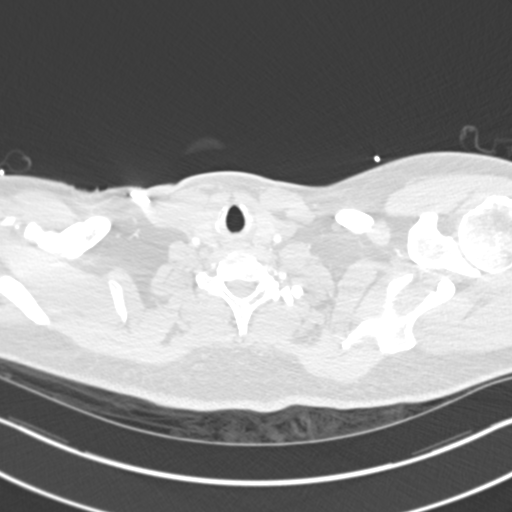

[Series 8: coronal mpr · coronal · 0.58mm/px · 1 of 138 slices shown]
[im 69/138  mediastinal]
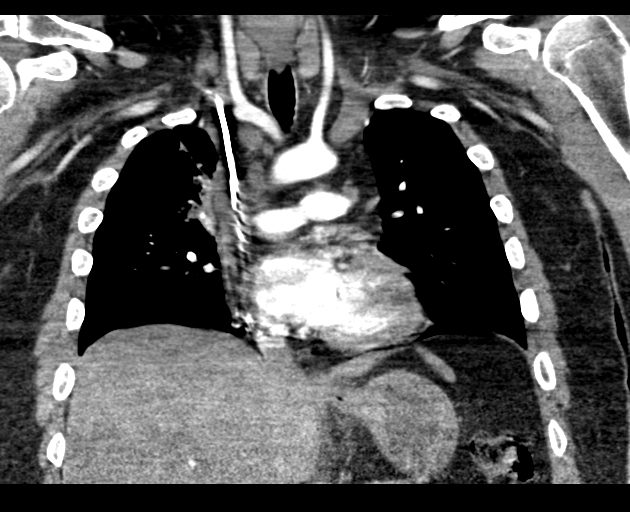

[16 of 36 positions shown; findings below may reference images not displayed]

FINDINGS: Cardiovascular: Right hilar adenopathy/soft tissue density causes
severe narrowing of the right upper lobe pulmonary arteries which
are attenuated. This causes mass effect on the distal main right
pulmonary artery. Circumferential soft tissue density involving the
right lower lobar pulmonary artery. Filling defects within the
segmental branches of the right lower lobe pulmonary arteries are
eccentric and may be due to direct invasion or BULISO pulmonary
embolus, for example series 6, image 145 and 149. No definite
pulmonary emboli on the left, however breathing motion artifact and
contrast bolus timing limits detailed assessment. Thoracic aorta is
normal in caliber without dissection. Right chest port tip in the
lower SVC. Heart is normal in size. No pericardial effusion.

Mediastinum/Nodes: Progressive right hilar adenopathy/soft tissue
density causes severe narrowing of the right upper lobe pulmonary
arteries. This is increased from prior exam. Representative right
hilar lymph node measures 22 mm, series 5, image 59, previously 19
mm. This is contiguous with right paratracheal soft tissue
densities/adenopathy which is difficult to delineate. Right lower
paratracheal node has increased currently 13 mm, series 5, image 52,
previously 10 mm. High mediastinal node measures 11 mm, series 5,
image 37, unchanged. No left hilar adenopathy. Esophagus slightly
patulous without wall thickening. No visualized thyroid nodule.

Lungs/Pleura: Spiculated right upper lobe cavitary nodule currently
measuring approximately 3 x 2.6 cm with decreased peripheral soft
tissue component from prior exam. This is contiguous with soft
tissue density that extends to the right hilum. Severe narrowing of
the right upper lobe bronchus. Ill-defined paramediastinal density
in the right upper lobe is likely post treatment related change.
Peripheral subpleural opacity in the right upper lobe abutting,
series 7, image 57 is nonspecific, and may represent pulmonary
infarct, post treatment related change, or pneumonia. Small left
apical nodule series 7, image 39 and 40 are unchanged. Small nodule
in the superior segment of the left lower lobe is unchanged, series
7, image 55. 3 mm perifissural nodule in the right middle lobe,
series 7, image 64, unchanged. No definite new nodule. No pleural
fluid.

Upper Abdomen: Known metastatic disease in the liver, not as well
visualized on the current exam given phase of contrast. No acute
abdominal findings.

Musculoskeletal: Known osseous metastatic disease in the spine. T3
lesion causing mild pathologic compression fracture, similar to
prior. No new spinal compression fractures.

Review of the MIP images confirms the above findings.
IMPRESSION: 1. Progressive right hilar adenopathy/soft tissue density which
causes severe narrowing of the right upper lobe pulmonary arteries
which are attenuated. Small segmental pulmonary emboli in the right
lower lobe are adjacent to adenopathy, may represent direct invasion
versus did no BULISO clot. Thromboembolic burden is small. No
pulmonary embolus on the left, however breathing motion artifact and
contrast bolus timing limits detailed assessment.
2. Spiculated right upper lobe cavitary nodule with decreased
peripheral soft tissue component from prior exam and slight decrease
in size.
3. Progressive right hilar adenopathy/soft tissue density causes
severe narrowing of the right upper lobe bronchus. There may be an
element of post treatment related change, however some of the
mediastinal nodes have increased in size suspicious for progression
in disease.
4. Additional small pulmonary nodules both lungs are unchanged from
exam last month.
5. Osseous metastatic disease is grossly unchanged. Known hepatic
metastatic disease is not well delineated given phase of contrast

These results were called by telephone at the time of interpretation
on [DATE] at [DATE] to PA BULISO , who verbally
acknowledged these results.

## 2019-06-13 IMAGING — DX DG CHEST 1V PORT
1 series · 1 of 1 positions shown · non-contrast
Comparison: [DATE]

CLINICAL DATA: Shortness of breath and chest pain

EXAM:
PORTABLE CHEST 1 VIEW

[chest ap]
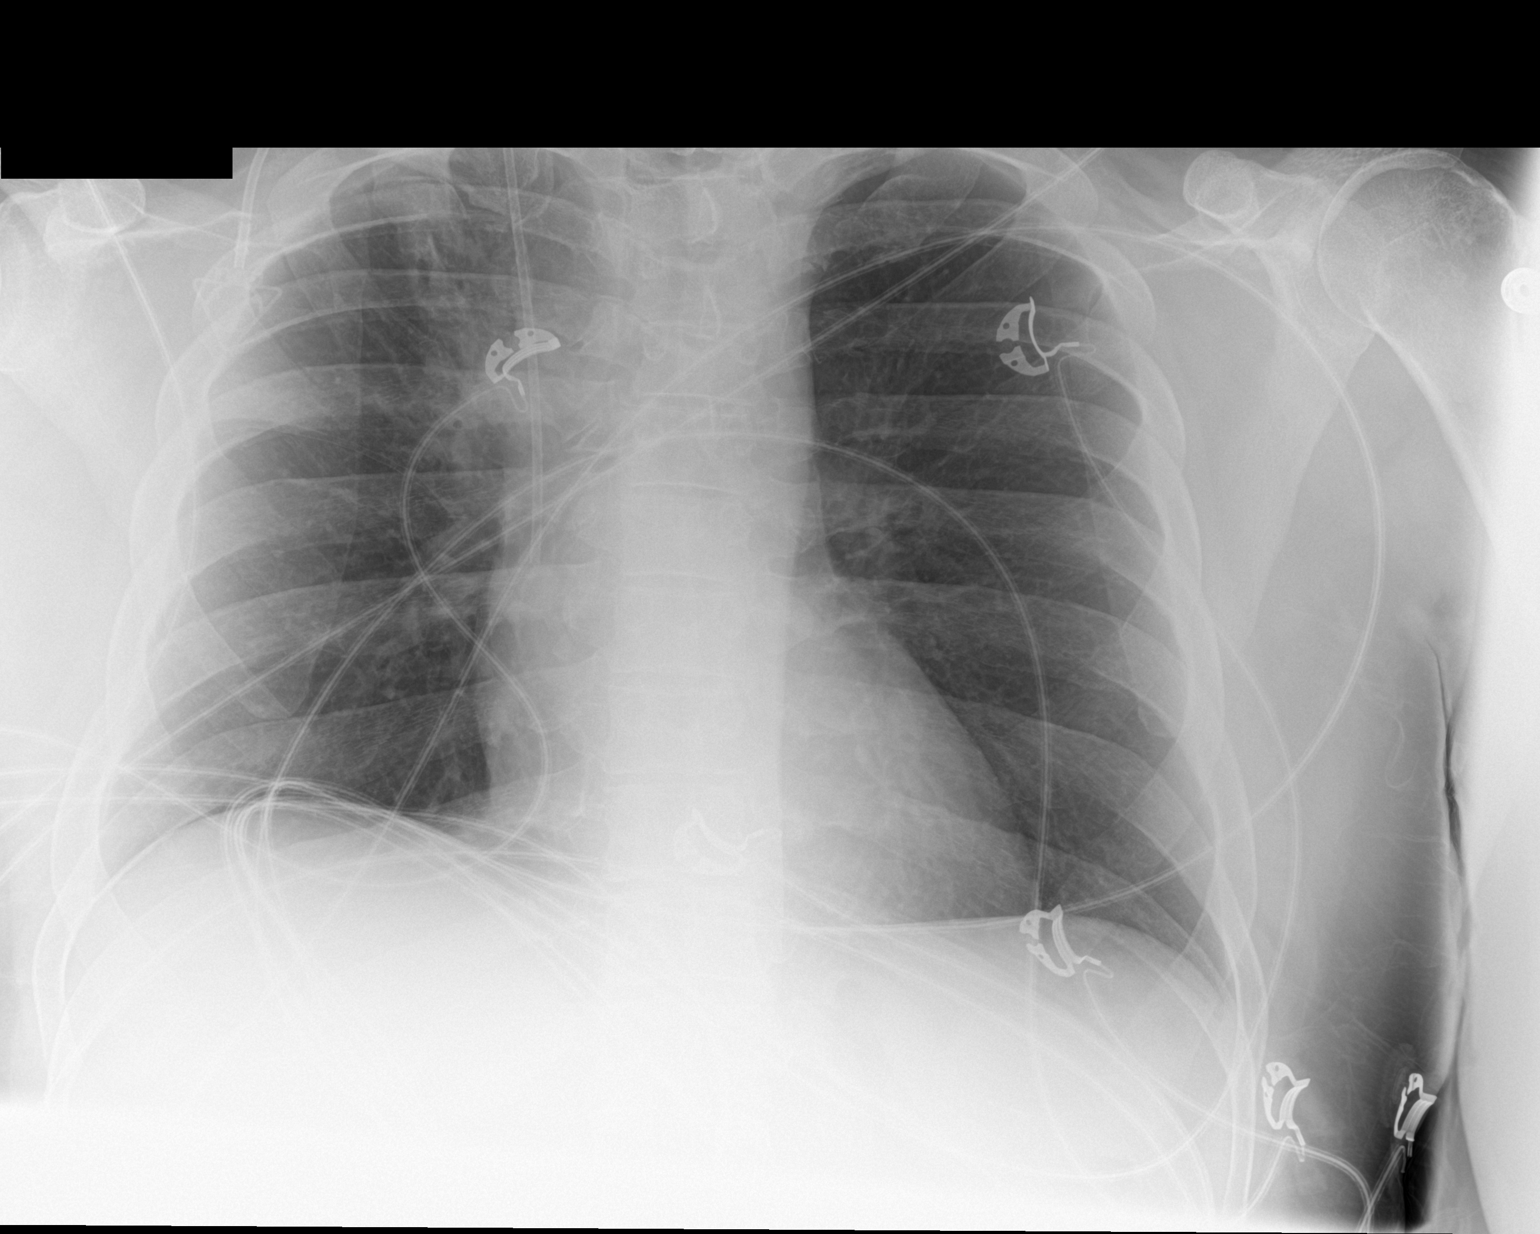

[1 of 1 positions shown; findings below may reference images not displayed]

FINDINGS: Port-A-Cath tip is in the superior vena cava. No pneumothorax. Lungs
are clear. Heart size and pulmonary vascularity are normal. No
adenopathy. No bone lesions.
IMPRESSION: Port-A-Cath tip in superior vena cava. No pneumothorax. Lungs clear.
Cardiac silhouette within normal limits.

## 2019-06-13 MED ORDER — CHLORHEXIDINE GLUCONATE CLOTH 2 % EX PADS
6.0000 | MEDICATED_PAD | Freq: Every day | CUTANEOUS | Status: DC
  Administered 2019-06-14 – 2019-06-16 (×2): 6 via TOPICAL

## 2019-06-13 MED ORDER — SODIUM CHLORIDE 0.9% FLUSH
10.0000 mL | INTRAVENOUS | Status: DC | PRN
  Administered 2019-06-16: 10 mL

## 2019-06-13 MED ORDER — HYDROMORPHONE HCL 2 MG PO TABS
2.0000 mg | ORAL_TABLET | ORAL | Status: DC | PRN
  Administered 2019-06-13: 4 mg via ORAL
  Filled 2019-06-13: qty 2

## 2019-06-13 MED ORDER — DEXAMETHASONE 4 MG PO TABS
4.0000 mg | ORAL_TABLET | Freq: Every day | ORAL | Status: DC
  Administered 2019-06-14: 4 mg via ORAL
  Filled 2019-06-13 (×2): qty 1

## 2019-06-13 MED ORDER — SODIUM CHLORIDE 0.9 % IV SOLN: INTRAVENOUS | Status: DC

## 2019-06-13 MED ORDER — SODIUM CHLORIDE 0.9% FLUSH
10.0000 mL | Freq: Two times a day (BID) | INTRAVENOUS | Status: DC
  Administered 2019-06-13: 10 mL

## 2019-06-13 MED ORDER — HYDROMORPHONE HCL 1 MG/ML IJ SOLN
1.0000 mg | INTRAMUSCULAR | Status: DC | PRN
  Administered 2019-06-13 – 2019-06-16 (×14): 1 mg via INTRAVENOUS
  Filled 2019-06-13 (×14): qty 1

## 2019-06-13 MED ORDER — IOHEXOL 350 MG/ML SOLN
100.0000 mL | Freq: Once | INTRAVENOUS | Status: AC | PRN
  Administered 2019-06-13: 100 mL via INTRAVENOUS

## 2019-06-13 MED ORDER — ONDANSETRON HCL 4 MG/2ML IJ SOLN
4.0000 mg | Freq: Once | INTRAMUSCULAR | Status: AC
  Administered 2019-06-13: 4 mg via INTRAVENOUS
  Filled 2019-06-13: qty 2

## 2019-06-13 MED ORDER — HYDROMORPHONE HCL 1 MG/ML IJ SOLN
1.0000 mg | Freq: Once | INTRAMUSCULAR | Status: AC
  Administered 2019-06-13: 1 mg via INTRAVENOUS
  Filled 2019-06-13: qty 1

## 2019-06-13 MED ORDER — SODIUM CHLORIDE 0.9 % IV BOLUS
500.0000 mL | Freq: Once | INTRAVENOUS | Status: AC
  Administered 2019-06-13: 500 mL via INTRAVENOUS

## 2019-06-13 MED ORDER — HEPARIN BOLUS VIA INFUSION
2450.0000 [IU] | INTRAVENOUS | Status: AC
  Administered 2019-06-13: 2450 [IU] via INTRAVENOUS
  Filled 2019-06-13: qty 2450

## 2019-06-13 MED ORDER — PANTOPRAZOLE SODIUM 40 MG PO TBEC
40.0000 mg | DELAYED_RELEASE_TABLET | Freq: Every day | ORAL | Status: DC
  Administered 2019-06-13 – 2019-06-16 (×4): 40 mg via ORAL
  Filled 2019-06-13 (×4): qty 1

## 2019-06-13 MED ORDER — SODIUM CHLORIDE (PF) 0.9 % IJ SOLN
INTRAMUSCULAR | Status: AC
  Filled 2019-06-13: qty 50

## 2019-06-13 MED ORDER — SODIUM CHLORIDE 0.9% FLUSH
3.0000 mL | Freq: Once | INTRAVENOUS | Status: AC
  Administered 2019-06-13: 3 mL via INTRAVENOUS

## 2019-06-13 MED ORDER — HEPARIN (PORCINE) 25000 UT/250ML-% IV SOLN
1550.0000 [IU]/h | INTRAVENOUS | Status: DC
  Administered 2019-06-13: 1550 [IU]/h via INTRAVENOUS
  Filled 2019-06-13: qty 250

## 2019-06-13 MED ORDER — METOPROLOL TARTRATE 5 MG/5ML IV SOLN: 5.0000 mg | Freq: Four times a day (QID) | INTRAVENOUS | Status: DC | PRN

## 2019-06-13 MED ORDER — HEPARIN (PORCINE) 25000 UT/250ML-% IV SOLN
1250.0000 [IU]/h | INTRAVENOUS | Status: DC
  Administered 2019-06-13: 1400 [IU]/h via INTRAVENOUS
  Filled 2019-06-13 (×2): qty 250

## 2019-06-13 MED ORDER — MORPHINE SULFATE (PF) 4 MG/ML IV SOLN
4.0000 mg | Freq: Once | INTRAVENOUS | Status: AC
  Administered 2019-06-13: 4 mg via INTRAVENOUS
  Filled 2019-06-13: qty 1

## 2019-06-13 MED ORDER — METOPROLOL TARTRATE 25 MG PO TABS
12.5000 mg | ORAL_TABLET | Freq: Two times a day (BID) | ORAL | Status: DC
  Administered 2019-06-13 – 2019-06-16 (×6): 12.5 mg via ORAL
  Filled 2019-06-13 (×6): qty 1

## 2019-06-13 NOTE — ED Notes (Signed)
Writer offered to keep the patient in triage 2 due to him currently receiving chemo, but patient refused and stated that he wanted to go to the lobby.

## 2019-06-13 NOTE — ED Notes (Signed)
Heparin drip d/c'd a/o by provider.

## 2019-06-13 NOTE — Telephone Encounter (Signed)
Patients wife called this morning while bringing the patient in to the ER.  She was concerned because the patient was having some side effects and she wasn't sure if it was related to him having the portacath placed or if it was side effects from the radiation or related to his present DVT.  Patient is currently being evaluated in ER by staff to rule out PE.    Explained to wife the process for portacath placement and how some side effects may have been related to the unnatural position that the patient is placed in for the procedure which may explain (jaw stiffness/chest pain complaints).  But explained that him seeking emergency medical attention was the best route for him right now.  Routed [name] and Dr. Lorenso Courier to make aware of recent ED visit with pending admission.

## 2019-06-13 NOTE — Progress Notes (Signed)
Informed RN of contraindications for drawing blood culture from port. William Ali reports MD is aware, is OK with culture from port since port was just placed on 5/17. Discussed with patient and wife, pt not agreeable to peripheral lab draw. He reports several attempts were made.

## 2019-06-13 NOTE — Progress Notes (Signed)
[place] for IV heparin Indication: recent DVT, suspicion for PE  Allergies  Allergen Reactions  . Onion Anaphylaxis  . Other     All sea food causes swelling and a rash.    . Peanut Oil Itching  . Peanut-Containing Drug Products Itching    Patient Measurements: Height: 5' 7.01" (170.2 cm) Weight: 93.7 kg (206 lb 9.1 oz) IBW/kg (Calculated) : 66.12 Heparin Dosing Weight: 85.9 kg  Vital Signs: Temp: 98.2 F (36.8 C) (05/18 1630) Temp Source: Oral (05/18 1630) BP: 154/84 (05/18 1630) Pulse Rate: 103 (05/18 1630)  Labs: Recent Labs    06/13/19 1153  HGB 12.8*  HCT 39.9  PLT 98*  APTT 31  LABPROT 16.2*  INR 1.4*  HEPARINUNFRC 0.21*  CREATININE 1.34*  TROPONINIHS 7    Estimated Creatinine Clearance: 84.7 mL/min (A) (by C-G formula based on SCr of 1.34 mg/dL (H)).   Medications:  PTA Xarelto starter pack-patient/family report he took only one 15mg  tablet daily on 06/04/19-06/06/19 due to confusion with instructions, then started 15mg  tablet twice daily appropriately 06/07/19-06/10/19 PM. Stopped after 06/10/19 8pm dose due to port placement on 06/12/19.   Assessment: 86 y/oM with adenocarcinoma of the lung with metastatic disease to the brain and spine presents to [facility] ED with chest pain, RLQ abdominal pain, headache, and left foot pain. Patient diagnosed with DVT on 06/04/2019, discharged from ED with Xarelto starter pack at that time. Pharmacy consulted for IV heparin dosing for recent DVT and now concern for PE. CTa chest pending. Note that heparin level can be falsely elevated due to recent Xarelto use.   Significant events:  5/18 PM: CT shows small PE vs tumor invasion of pulmonary artery; heparin stopped d/t small clot burden and risk of massive hemoptysis; [facility] will defer to CCM recommendations for [facility]. Per CCM, CT evaluation favors thrombus; will resume heparin w/ conservative dosing; stop for any s/s hemoptysis  Today,  06/13/2019:  Hgb and Plt both low (likely d/t recent chemo)  Heparin to resume w/o bolus and targeting lower therapeutic range per CCM  Baseline aPTT normal; heparin and INR level slightly elevated d/t recent DOAC  No bleeding or IV access issues per RN  Goal of Therapy:  APTT 66-84 seconds Heparin level 0.3-0.5 units/ml Monitor platelets by anticoagulation protocol: Yes   Plan:   Resume heparin at 1400 units/hr (16 units/kg/hr), no bolus  Check 6-hr aPTT (heparin level still likely unhelpful at this point)  Daily CBC and heparin level; aPTT as needed while DOAC effect persists  Monitor closely for s/sx of bleeding  Reuel Boom, PharmD, BCPS [phone] 06/13/2019, 6:54 PM

## 2019-06-13 NOTE — ED Notes (Signed)
IV access attempted by this RN without success. IV team stat consult placed.

## 2019-06-13 NOTE — ED Notes (Signed)
Attempted report. RN Tess on the other line with MD.

## 2019-06-13 NOTE — H&P (Signed)
History and Physical    William Ali TKP:[id number] DOB: 04-22-1985 DOA: 06/13/2019  PCP: Patient, No Pcp Per Patient coming from: Home  Chief Complaint: Chest pain abdominal pain HPI: William Ali is a 34 y.o. 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  He came to the ER with complaints of chest pain abdominal pain for over a week. Wife reports he was diagnosed with left lower extremity DVT 06/04/2019 and was started on Xarelto.  He has missed few doses of Xarelto.  He also did not take the Xarelto on 06/11/2019 in preparation for the Port-A-Cath placement 06/12/2019.  He had a port placed on 06/12/2019. History is mainly obtained from his wife who was in the patient's room as patient had just received Dilaudid and was sleeping. Wife reports the chest pain is pleuritic in nature with shortness of breath and abdominal pain with no nausea vomiting or diarrhea.  He has severe chronic headaches for which he takes Decadron.  She denied any fever chills or cough. Denies any urinary complaints.    ED Course: Patient received morphine and Zofran and was started on heparin drip but later on the drip was stopped.  He was hypertensive tachycardic and tachypneic in the ER.  His BUN was 35 creatinine 1.34 AST 62 ALT 153 troponin was 7 lactic acid was 2.0. Covid pending CT chest with and without contrast-Progressive right hilar adenopathy/soft tissue density which causes severe narrowing of the right upper lobe pulmonary arteries which are attenuated. Small segmental pulmonary emboli in the right lower lobe are adjacent to adenopathy, may represent direct invasion versus did no novo clot. Thromboembolic burden is small. No pulmonary embolus on the left, however breathing motion artifact and contrast bolus timing limits  detailed assessment.piculated right upper lobe cavitary nodule with decreased peripheral soft tissue component from prior exam and slight decrease in size. Progressive right hilar adenopathy/soft tissue density causes severe narrowing of the right upper lobe bronchus. There may be an element of post treatment related change, however some of the mediastinal nodes have increased in size suspicious for progression in disease. Additional small pulmonary nodules both lungs are unchanged from exam last month. Osseous metastatic disease is grossly unchanged. Known hepatic metastatic disease is not well delineated given phase of contrast   Review of Systems: As per HPI otherwise all other systems reviewed and are negative  Ambulatory Status: He is ambulatory at baseline.  Past Medical History:  Diagnosis Date  . Back pain   . Cancer ([place])   . Hypertension     Past Surgical History:  Procedure Laterality Date  . IR IMAGING GUIDED PORT INSERTION  06/12/2019  . KNEE SURGERY      Social History   Socioeconomic History  . Marital status: Married    Spouse name: Not on file  . Number of children: Not on file  . Years of education: Not on file  . Highest education level: Not on file  Occupational History  . Not on file  Tobacco Use  . Smoking status: Never Smoker  . Smokeless tobacco: Never Used  Substance and Sexual Activity  . Alcohol use: No  . Drug use: No  . Sexual activity: Yes  Other Topics Concern  . Not on file  Social History Narrative  . Not on file   Social Determinants of Health  Financial Resource Strain:   . Difficulty of Paying Living Expenses:   Food Insecurity:   . Worried About Charity fundraiser in the Last Year:   . Arboriculturist in the Last Year:   Transportation Needs:   . Film/video editor (Medical):   Marland Kitchen Lack of Transportation (Non-Medical):   Physical Activity:   . Days of Exercise per Week:   . Minutes of Exercise per Session:    Stress:   . Feeling of Stress :   Social Connections:   . Frequency of Communication with Friends and Family:   . Frequency of Social Gatherings with Friends and Family:   . Attends Religious Services:   . Active Member of Clubs or Organizations:   . Attends Archivist Meetings:   Marland Kitchen Marital Status:   Intimate Partner Violence:   . Fear of Current or Ex-Partner:   . Emotionally Abused:   Marland Kitchen Physically Abused:   . Sexually Abused:     Allergies  Allergen Reactions  . Onion Anaphylaxis  . Other     All sea food causes swelling and a rash.    . Peanut Oil Itching  . Peanut-Containing Drug Products Itching    Family History  Problem Relation Age of Onset  . Hypertension Mother   . Diabetes Father       Prior to Admission medications   Medication Sig Start Date End Date Taking? Authorizing Provider  albuterol (VENTOLIN HFA) 108 (90 Base) MCG/ACT inhaler Inhale 2 puffs into the lungs every 6 (six) hours as needed for wheezing or shortness of breath. 05/19/19  Yes Orson Slick, MD  dexamethasone (DECADRON) 4 MG tablet Take 1 tablet (4 mg total) by mouth 2 (two) times daily with a meal. Starting on 5/11, and drop dose to once daily on 5/14 06/06/19  Yes Hayden Pedro, PA-C  hydrochlorothiazide (HYDRODIURIL) 25 MG tablet Take 1 tablet (25 mg total) by mouth daily. 07/21/18  Yes Lamptey, Myrene Galas, MD  HYDROmorphone (DILAUDID) 2 MG tablet 1 to 2 tablets q 4 hours prn pain. Patient taking differently: Take 2-4 mg by mouth every 4 (four) hours as needed for moderate pain.  06/09/19  Yes Tanner, Lyndon Code., PA-C  pantoprazole (PROTONIX) 40 MG tablet Take 40 mg by mouth daily. 04/11/19  Yes [provider]  benzonatate (TESSALON) 100 MG capsule Take 1 capsule (100 mg total) by mouth every 8 (eight) hours. Patient not taking: Reported on 05/25/2019 05/05/19   Darr, Marguerita Beards, PA-C  cetirizine (ZYRTEC) 10 MG tablet Take 1 tablet (10 mg total) by mouth daily. Patient not  taking: Reported on 06/02/2019 11/23/18   Augusto Gamble B, NP  dexamethasone (DECADRON) 4 MG tablet Take 2 tablets (8 mg total) by mouth daily. Patient not taking: Reported on 06/13/2019 05/19/19   Orson Slick, MD  gabapentin (NEURONTIN) 600 MG tablet 1 PO QHS, may increase to 1 PO BID if needed. Patient not taking: Reported on 06/13/2019 06/09/19   Harle Stanford., PA-C  LORazepam (ATIVAN) 0.5 MG tablet 1 tab po q 4-6 hours prn or 1 tab po 30 minutes prior to radiation Patient not taking: Reported on 06/02/2019 05/25/19   Hayden Pedro, PA-C  ondansetron (ZOFRAN) 8 MG tablet Take 1 tablet (8 mg total) by mouth every 8 (eight) hours as needed for nausea or vomiting. Patient not taking: Reported on 06/02/2019 05/30/19   Hayden Pedro, PA-C  RIVAROXABAN Alveda Reasons) VTE STARTER  PACK (15 & 20 MG TABLETS) Follow package directions: Take one 15mg  tablet by mouth twice a day. On day 22, switch to one 20mg  tablet once a day. Take with food. 06/04/19   Dorie Rank, MD  sodium chloride (OCEAN) 0.65 % SOLN nasal spray Place 1 spray into both nostrils as needed for congestion. Patient not taking: Reported on 06/02/2019 05/05/19   Darr, Marguerita Beards, PA-C  amLODipine (NORVASC) 5 MG tablet Take 1 tablet (5 mg total) by mouth daily. 04/21/18 07/21/18  Robyn Haber, MD  famotidine (PEPCID) 20 MG tablet Take 1 tablet (20 mg total) by mouth 2 (two) times daily. Patient taking differently: Take 20 mg by mouth 2 (two) times daily as needed for heartburn.  04/21/18 07/21/18  Robyn Haber, MD    Physical Exam: Sleeping after getting morphine.  Wife by the bedside. Vitals:   06/13/19 1300 06/13/19 1330 06/13/19 1412 06/13/19 1448  BP: (!) 158/86 (!) 159/93 (!) 164/92 (!) 166/92  Pulse: (!) 106 (!) 103 (!) 104 (!) 102  Resp: (!) 30 (!) 22 18 (!) 22  Temp:      TempSrc:      SpO2: 99% 100% 98% 98%  Weight:      Height:        Tachycardic tachypneic and hypotensive in the ER. Marland Kitchen General:  Appears calm and  comfortable . Eyes: PERRL, EOMI, normal lids, iris . ENT: grossly normal hearing, lips & tongue, mmm . Neck:  no LAD, masses or thyromegaly . Cardiovascular: RRR, no m/r/g. No LE edema.  Marland Kitchen Respiratory: CTA bilaterally, no w/r/r. Normal respiratory effort. . Abdomen:  soft, ntnd, NABS . Skin: no rash or induration seen on limited exam . Musculoskeletal:grossly normal tone BUE/BLE, good ROM, no bony abnormality . Psychiatric:  grossly normal mood and affect, speech fluent and appropriate, AOx3 . Neurologic: CN 2-12 grossly intact, moves all extremities in coordinated fashion, sensation intact  Labs on Admission: I have personally reviewed following labs and imaging studies  CBC: Recent Labs  Lab 06/13/19 1153  WBC 5.5  NEUTROABS 4.5  HGB 12.8*  HCT 39.9  MCV 84.5  PLT 98*   Basic Metabolic Panel: Recent Labs  Lab 06/13/19 1153  NA 139  K 3.8  CL 102  CO2 24  GLUCOSE 110*  BUN 35*  CREATININE 1.34*  CALCIUM 9.2   GFR: Estimated Creatinine Clearance: 84.7 mL/min (A) (by C-G formula based on SCr of 1.34 mg/dL (H)). Liver Function Tests: Recent Labs  Lab 06/13/19 1153  AST 62*  ALT 153*  ALKPHOS 374*  BILITOT 1.0  PROT 7.0  ALBUMIN 3.6   Recent Labs  Lab 06/13/19 1153  LIPASE 28   No results for input(s): AMMONIA in the last 168 hours. Coagulation Profile: Recent Labs  Lab 06/13/19 1153  INR 1.4*   Cardiac Enzymes: No results for input(s): CKTOTAL, CKMB, CKMBINDEX, TROPONINI in the last 168 hours. BNP (last 3 results) No results for input(s): PROBNP in the last 8760 hours. HbA1C: No results for input(s): HGBA1C in the last 72 hours. CBG: No results for input(s): GLUCAP in the last 168 hours. Lipid Profile: No results for input(s): CHOL, HDL, LDLCALC, TRIG, CHOLHDL, LDLDIRECT in the last 72 hours. Thyroid Function Tests: No results for input(s): TSH, T4TOTAL, FREET4, T3FREE, THYROIDAB in the last 72 hours. Anemia Panel: No results for input(s):  VITAMINB12, FOLATE, FERRITIN, TIBC, IRON, RETICCTPCT in the last 72 hours. Urine analysis:    Component Value Date/Time   COLORURINE YELLOW  06/02/2019 1838   APPEARANCEUR CLEAR 06/02/2019 1838   LABSPEC 1.016 06/02/2019 1838   PHURINE 5.0 06/02/2019 1838   GLUCOSEU NEGATIVE 06/02/2019 1838   HGBUR LARGE (A) 06/02/2019 1838   BILIRUBINUR NEGATIVE 06/02/2019 1838   KETONESUR NEGATIVE 06/02/2019 1838   PROTEINUR NEGATIVE 06/02/2019 1838   UROBILINOGEN 1.0 05/02/2014 1253   NITRITE NEGATIVE 06/02/2019 1838   LEUKOCYTESUR NEGATIVE 06/02/2019 1838    Creatinine Clearance: Estimated Creatinine Clearance: 84.7 mL/min (A) (by C-G formula based on SCr of 1.34 mg/dL (H)).  Sepsis Labs: @LABRCNTIP (procalcitonin:4,lacticidven:4) )No results found for this or any previous visit (from the past 240 hour(s)).   Radiological Exams on Admission: CT Angio Chest PE W and/or Wo Contrast  Result Date: 06/13/2019 CLINICAL DATA:  Shortness of breath. Chest pain. Metastatic lung carcinoma. Active chemotherapy and radiation. EXAM: CT ANGIOGRAPHY CHEST WITH CONTRAST TECHNIQUE: Multidetector CT imaging of the chest was performed using the standard protocol during bolus administration of intravenous contrast. Multiplanar CT image reconstructions and MIPs were obtained to evaluate the vascular anatomy. CONTRAST:  160mL OMNIPAQUE IOHEXOL 350 MG/ML SOLN COMPARISON:  Chest radiograph earlier this day. Most recent chest CT 05/10/2019 FINDINGS: Cardiovascular: Right hilar adenopathy/soft tissue density causes severe narrowing of the right upper lobe pulmonary arteries which are attenuated. This causes mass effect on the distal main right pulmonary artery. Circumferential soft tissue density involving the right lower lobar pulmonary artery. Filling defects within the segmental branches of the right lower lobe pulmonary arteries are eccentric and may be due to direct invasion or de novo pulmonary embolus, for example series  6, image 145 and 149. No definite pulmonary emboli on the left, however breathing motion artifact and contrast bolus timing limits detailed assessment. Thoracic aorta is normal in caliber without dissection. Right chest port tip in the lower SVC. Heart is normal in size. No pericardial effusion. Mediastinum/Nodes: Progressive right hilar adenopathy/soft tissue density causes severe narrowing of the right upper lobe pulmonary arteries. This is increased from prior exam. Representative right hilar lymph node measures 22 mm, series 5, image 59, previously 19 mm. This is contiguous with right paratracheal soft tissue densities/adenopathy which is difficult to delineate. Right lower paratracheal node has increased currently 13 mm, series 5, image 52, previously 10 mm. High mediastinal node measures 11 mm, series 5, image 37, unchanged. No left hilar adenopathy. Esophagus slightly patulous without wall thickening. No visualized thyroid nodule. Lungs/Pleura: Spiculated right upper lobe cavitary nodule currently measuring approximately 3 x 2.6 cm with decreased peripheral soft tissue component from prior exam. This is contiguous with soft tissue density that extends to the right hilum. Severe narrowing of the right upper lobe bronchus. Ill-defined paramediastinal density in the right upper lobe is likely post treatment related change. Peripheral subpleural opacity in the right upper lobe abutting, series 7, image 57 is nonspecific, and may represent pulmonary infarct, post treatment related change, or pneumonia. Small left apical nodule series 7, image 39 and 40 are unchanged. Small nodule in the superior segment of the left lower lobe is unchanged, series 7, image 55. 3 mm perifissural nodule in the right middle lobe, series 7, image 64, unchanged. No definite new nodule. No pleural fluid. Upper Abdomen: Known metastatic disease in the liver, not as well visualized on the current exam given phase of contrast. No acute  abdominal findings. 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 T3 lesion causing mild pathologic compression fracture, similar to prior. No new spinal compression fractures. Review of the MIP images confirms  the above findings. IMPRESSION: 1. Progressive right hilar adenopathy/soft tissue density which causes severe narrowing of the right upper lobe pulmonary arteries which are attenuated. Small segmental pulmonary emboli in the right lower lobe are adjacent to adenopathy, may represent direct invasion versus did no novo clot. Thromboembolic burden is small. No pulmonary embolus on the left, however breathing motion artifact and contrast bolus timing limits detailed assessment. 2. Spiculated right upper lobe cavitary nodule with decreased peripheral soft tissue component from prior exam and slight decrease in size. 3. Progressive right hilar adenopathy/soft tissue density causes severe narrowing of the right upper lobe bronchus. There may be an element of post treatment related change, however some of the mediastinal nodes have increased in size suspicious for progression in disease. 4. Additional small pulmonary nodules both lungs are unchanged from exam last month. 5. Osseous metastatic disease is grossly unchanged. Known hepatic metastatic disease is not well delineated given phase of contrast These results were called by telephone at the time of interpretation on 06/13/2019 at 1:52 pm to Ashley , who verbally acknowledged these results. Electronically Signed   By: Keith Rake M.D.   On: 06/13/2019 13:53   DG Chest Portable 1 View  Result Date: 06/13/2019 CLINICAL DATA:  Shortness of breath and chest pain EXAM: PORTABLE CHEST 1 VIEW COMPARISON:  Jun 02, 2019 FINDINGS: Port-A-Cath tip is in the superior vena cava. No pneumothorax. Lungs are clear. Heart size and pulmonary vascularity are normal. No adenopathy. No bone lesions. IMPRESSION: Port-A-Cath tip in superior vena  cava. No pneumothorax. Lungs clear. Cardiac silhouette within normal limits. Electronically Signed   By: Lowella Grip III M.D.   On: 06/13/2019 10:54   IR IMAGING GUIDED PORT INSERTION  Result Date: 06/12/2019 INDICATION: 34 year old male with a history of metastatic right upper lobe lung cancer. He presents for port catheter placement. EXAM: IMPLANTED PORT A CATH PLACEMENT WITH ULTRASOUND AND FLUOROSCOPIC GUIDANCE MEDICATIONS: Ancef 2 g; The antibiotic was administered within an appropriate time interval prior to skin puncture. ANESTHESIA/SEDATION: Versed 4 mg IV; Fentanyl 100 mcg IV; Moderate Sedation Time:  19 minutes The patient was continuously monitored during the procedure by the interventional radiology nurse under my direct supervision. FLUOROSCOPY TIME:  0 minutes, 30 seconds (7 mGy) COMPLICATIONS: None immediate. PROCEDURE: The right neck and chest was prepped with chlorhexidine, and draped in the usual sterile fashion using maximum barrier technique (cap and mask, sterile gown, sterile gloves, large sterile sheet, hand hygiene and cutaneous antiseptic). Local anesthesia was attained by infiltration with 1% lidocaine with epinephrine. Ultrasound demonstrated patency of the right internal jugular vein, and this was documented with an image. Under real-time ultrasound guidance, this vein was accessed with a 21 gauge micropuncture needle and image documentation was performed. A small dermatotomy was made at the access site with an 11 scalpel. A 0.018" wire was advanced into the SVC and the access needle exchanged for a 49F micropuncture vascular sheath. The 0.018" wire was then removed and a 0.035" wire advanced into the IVC. An appropriate location for the subcutaneous reservoir was selected below the clavicle and an incision was made through the skin and underlying soft tissues. The subcutaneous tissues were then dissected using a combination of blunt and sharp surgical technique and a pocket was  formed. A single lumen power injectable portacatheter was then tunneled through the subcutaneous tissues from the pocket to the dermatotomy and the port reservoir placed within the subcutaneous pocket. The venous access site was then serially  dilated and a peel away vascular sheath placed over the wire. The wire was removed and the port catheter advanced into position under fluoroscopic guidance. The catheter tip is positioned in the superior cavoatrial junction. This was documented with a spot image. The portacatheter was then tested and found to flush and aspirate well. The port was flushed with saline followed by 100 units/mL heparinized saline. The pocket was then closed in two layers using first subdermal inverted interrupted absorbable sutures followed by a running subcuticular suture. The epidermis was then sealed with Dermabond. The dermatotomy at the venous access site was also closed with Dermabond. IMPRESSION: Successful placement of a right IJ approach Power Port with ultrasound and fluoroscopic guidance. The catheter is ready for use. Electronically Signed   By: Jacqulynn Cadet M.D.   On: 06/12/2019 16:26    EKG: Sinus tach Assessment/Plan Active Problems:   * No active hospital problems. *   #1 PE/DVT-patient diagnosed with DVT left lower extremity 06/04/2019.  He was started on Xarelto which he has missed few doses of it.  He came to the ER with complaints of pleuritic chest pain and shortness of breath with upper abdominal pain. CT of the chest-showed progressive right hilar adenopathy and soft tissue density which causes severe narrowing of the right upper lobe pulmonary arteries, small segmental pulmonary embolism in the right lower lobe adjacent to the adenopathy, may represent direct invasion, thromboembolic burden is small no PE on the left, spiculated right upper lobe cavitary nodule with decreased peripheral soft tissue component with slight decrease in size, progressive right hilar  adenopathy causes severe narrowing of the right upper lobe bronchus.Small pulmonary nodules in both lungs unchanged from last month.  Patient was started on heparin in the ER and was stopped after the CT of the chest was officially read. Will restart heparin and monitor closely for hemoptysis.consider IVC filter if he does develop hemoptysis.appreciate  PCCM input. Patient on Xarelto prior to admission.  #2 metastatic adenocarcinoma of the lungs with mets to the brain spine and liver-on chemotherapy/hormone therapy and immunotherapy by Dr. Lorenso Courier and [name].  He is also getting radiation to the brain and spine.  #3 severe headaches from meds on Decadron tapering dose with Dilaudid continue.  #4 hypertension he is on HCTZ at home which I will hold he looks more dehydrated and dry at this time.  We will give him IV fluids and start metoprolol.  #5 LLE dvt-on heparin  Severity of Illness:  . Estimated body mass index is 32.35 kg/m as calculated from the following:   Height as of this encounter: 5\' 7"  (1.702 m).   Weight as of this encounter: 93.7 kg.   DVT prophylaxis: Heparin Code Status: Full code Family Communication: Discussed with wife Disposition Plan: Pending clinical improvement Consults called: ED physician discussed with PCCM Admission status: Inpatient   Georgette Shell MD [facility]  If 7PM-7AM, please contact night-coverage [email or web address] Password [facility]  06/13/2019, 2:52 PM

## 2019-06-13 NOTE — Telephone Encounter (Signed)
PROGRESS NOTE: Sent schedule message to get patient infusion on 06/15/19 to get rescheduled. Patients chemo education class was also missed today.

## 2019-06-13 NOTE — Consult Note (Addendum)
NAME:  William Ali, MRN:  [id number], DOB:  10-10-1985, LOS: 0 ADMISSION DATE:  06/13/2019, CONSULTATION DATE:  06/13/2019 REFERRING MD:  Jacki Cones MD, CHIEF COMPLAINT:  PE  Brief History   34 year old with history of recently diagnosed metastatic lung cancer diagnosed with liver biopsy on chemotherapy/immunotherapy/hormonal therapy with whole brain and spine radiation.  Diagnosed with DVT on 5/9 on Xarelto.  Since then he has been having mild hemoptysis with coughing but no major bleed. Admitted with chest, abdominal pain. CTA shows worsening progressive hilar adenopathy, small segmental PE in the right.  PCCM consulted due to concern for tumor invasion and recommendations for anticoagulation in the setting.  Past Medical History   Back pain, hypertension, metastatic adenocarcinoma.  Significant Hospital Events    5/18- Admit  Consults:  PCCM  Procedures:    Significant Diagnostic Tests:   CTA 06/13/19-progressive right hilar adenopathy, spiculated right upper lobe nodule decreased in size.  Small segmental emboli in the right lower lobe.  May represent direct invasion versus de novo clot.  Micro Data:    Antimicrobials:    Interim history/subjective:    Objective   Blood pressure (!) 160/99, pulse (!) 102, temperature (!) 97.5 F (36.4 C), temperature source Oral, resp. rate 20, height 5\' 7"  (1.702 m), weight 93.7 kg, SpO2 100 %.        Intake/Output Summary (Last 24 hours) at 06/13/2019 1527 Last data filed at 06/13/2019 1507 Gross per 24 hour  Intake 550 ml  Output --  Net 550 ml   Filed Weights   06/13/19 1012  Weight: 93.7 kg    Examination: Blood pressure (!) 160/99, pulse (!) 102, temperature (!) 97.5 F (36.4 C), temperature source Oral, resp. rate 20, height 5\' 7"  (1.702 m), weight 93.7 kg, SpO2 100 %. Gen:      No acute distress HEENT:  EOMI, sclera anicteric, patch over right eye Neck:     No masses; no thyromegaly Lungs:    Clear to  auscultation bilaterally; normal respiratory effort CV:         Regular rate and rhythm; no murmurs Abd:      + bowel sounds; soft, non-tender; no palpable masses, no distension Ext:    No edema; adequate peripheral perfusion Skin:      Warm and dry; no rash Neuro: alert and oriented x 3 Psych: normal mood and affect  Resolved Hospital Problem list     Assessment & Plan:  Metastatic adenocarcinoma DVT, PE.  On Xarelto as outpatient Now with chest pain, filling defects on CT representing clot versus tumor invasion  I have reviewed the CT with radiology.  It is hard to tell if this is actually tumor invasion and appearance favors venous thromboembolism We will recommend heparin with no bolus and close monitoring.  If he does develop hemoptysis then will need to do an IVC filter.  Had long discussion with patient and wife today and explained the rationale and risk of hemoptysis, massive bleed and death with anticoagulation. They have made the informed decision to proceed with anticoagulation.  Best practice:  Diet: PO Pain/Anxiety/Delirium protocol (if indicated): NA VAP protocol (if indicated): NA DVT prophylaxis: Heparin GI prophylaxis: PPI Glucose control: Monitor Mobility: OOB Code Status: Full Family Communication: Pt and wife updated Disposition: Tele  Labs   CBC: Recent Labs  Lab 06/13/19 1153  WBC 5.5  NEUTROABS 4.5  HGB 12.8*  HCT 39.9  MCV 84.5  PLT 98*    Basic Metabolic  Panel: Recent Labs  Lab 06/13/19 1153  NA 139  K 3.8  CL 102  CO2 24  GLUCOSE 110*  BUN 35*  CREATININE 1.34*  CALCIUM 9.2   GFR: Estimated Creatinine Clearance: 84.7 mL/min (A) (by C-G formula based on SCr of 1.34 mg/dL (H)). Recent Labs  Lab 06/13/19 1153  WBC 5.5  LATICACIDVEN 2.0*    Liver Function Tests: Recent Labs  Lab 06/13/19 1153  AST 62*  ALT 153*  ALKPHOS 374*  BILITOT 1.0  PROT 7.0  ALBUMIN 3.6   Recent Labs  Lab 06/13/19 1153  LIPASE 28   No  results for input(s): AMMONIA in the last 168 hours.  ABG No results found for: PHART, PCO2ART, PO2ART, HCO3, TCO2, ACIDBASEDEF, O2SAT   Coagulation Profile: Recent Labs  Lab 06/13/19 1153  INR 1.4*    Cardiac Enzymes: No results for input(s): CKTOTAL, CKMB, CKMBINDEX, TROPONINI in the last 168 hours.  HbA1C: No results found for: HGBA1C  CBG: No results for input(s): GLUCAP in the last 168 hours.  Review of Systems:   REVIEW OF SYSTEMS:   All negative; except for those that are bolded, which indicate positives.  Constitutional: weight loss, weight gain, night sweats, fevers, chills, fatigue, weakness.  HEENT: headaches, sore throat, sneezing, nasal congestion, post nasal drip, difficulty swallowing, tooth/dental problems, visual complaints, visual changes, ear aches. Neuro: difficulty with speech, weakness, numbness, ataxia. CV:  chest pain, orthopnea, PND, swelling in lower extremities, dizziness, palpitations, syncope.  Resp: cough, hemoptysis, dyspnea, wheezing. GI: heartburn, indigestion, abdominal pain, nausea, vomiting, diarrhea, constipation, change in bowel habits, loss of appetite, hematemesis, melena, hematochezia.  GU: dysuria, change in color of urine, urgency or frequency, flank pain, hematuria. MSK: joint pain or swelling, decreased range of motion. Psych: change in mood or affect, depression, anxiety, suicidal ideations, homicidal ideations. Skin: rash, itching, bruising.  Past Medical History  He,  has a past medical history of Back pain, Cancer ([place]), and Hypertension.   Surgical History    Past Surgical History:  Procedure Laterality Date  . IR IMAGING GUIDED PORT INSERTION  06/12/2019  . KNEE SURGERY       Social History   reports that he has never smoked. He has never used smokeless tobacco. He reports that he does not drink alcohol or use drugs.   Family History   His family history includes Diabetes in his father; Hypertension in his mother.    Allergies Allergies  Allergen Reactions  . Onion Anaphylaxis  . Other     All sea food causes swelling and a rash.    . Peanut Oil Itching  . Peanut-Containing Drug Products Itching     Home Medications  Prior to Admission medications   Medication Sig Start Date End Date Taking? Authorizing Provider  albuterol (VENTOLIN HFA) 108 (90 Base) MCG/ACT inhaler Inhale 2 puffs into the lungs every 6 (six) hours as needed for wheezing or shortness of breath. 05/19/19  Yes Orson Slick, MD  dexamethasone (DECADRON) 4 MG tablet Take 1 tablet (4 mg total) by mouth 2 (two) times daily with a meal. Starting on 5/11, and drop dose to once daily on 5/14 06/06/19  Yes Hayden Pedro, PA-C  hydrochlorothiazide (HYDRODIURIL) 25 MG tablet Take 1 tablet (25 mg total) by mouth daily. 07/21/18  Yes Lamptey, Myrene Galas, MD  HYDROmorphone (DILAUDID) 2 MG tablet 1 to 2 tablets q 4 hours prn pain. Patient taking differently: Take 2-4 mg by mouth every 4 (four) hours  as needed for moderate pain.  06/09/19  Yes Tanner, Lyndon Code., PA-C  pantoprazole (PROTONIX) 40 MG tablet Take 40 mg by mouth daily. 04/11/19  Yes [provider]  benzonatate (TESSALON) 100 MG capsule Take 1 capsule (100 mg total) by mouth every 8 (eight) hours. Patient not taking: Reported on 05/25/2019 05/05/19   Darr, Marguerita Beards, PA-C  cetirizine (ZYRTEC) 10 MG tablet Take 1 tablet (10 mg total) by mouth daily. Patient not taking: Reported on 06/02/2019 11/23/18   Augusto Gamble B, NP  dexamethasone (DECADRON) 4 MG tablet Take 2 tablets (8 mg total) by mouth daily. Patient not taking: Reported on 06/13/2019 05/19/19   Orson Slick, MD  gabapentin (NEURONTIN) 600 MG tablet 1 PO QHS, may increase to 1 PO BID if needed. Patient not taking: Reported on 06/13/2019 06/09/19   Harle Stanford., PA-C  LORazepam (ATIVAN) 0.5 MG tablet 1 tab po q 4-6 hours prn or 1 tab po 30 minutes prior to radiation Patient not taking: Reported on 06/02/2019 05/25/19    Hayden Pedro, PA-C  ondansetron (ZOFRAN) 8 MG tablet Take 1 tablet (8 mg total) by mouth every 8 (eight) hours as needed for nausea or vomiting. Patient not taking: Reported on 06/02/2019 05/30/19   Hayden Pedro, PA-C  RIVAROXABAN [employer]) VTE STARTER PACK (15 & 20 MG TABLETS) Follow package directions: Take one 15mg  tablet by mouth twice a day. On day 22, switch to one 20mg  tablet once a day. Take with food. 06/04/19   Dorie Rank, MD  sodium chloride (OCEAN) 0.65 % SOLN nasal spray Place 1 spray into both nostrils as needed for congestion. Patient not taking: Reported on 06/02/2019 05/05/19   Darr, Marguerita Beards, PA-C  amLODipine (NORVASC) 5 MG tablet Take 1 tablet (5 mg total) by mouth daily. 04/21/18 07/21/18  Robyn Haber, MD  famotidine (PEPCID) 20 MG tablet Take 1 tablet (20 mg total) by mouth 2 (two) times daily. Patient taking differently: Take 20 mg by mouth 2 (two) times daily as needed for heartburn.  04/21/18 07/21/18  Robyn Haber, MD     Critical care time: NA    Marshell Garfinkel MD [facility] Please see [email or web address] for pager details.  06/13/2019, 6:00 PM

## 2019-06-13 NOTE — ED Provider Notes (Signed)
[facility] DEPT Provider Note   CSN: [id number] Arrival date & time: 06/13/19  4196     History Chief Complaint  Patient presents with  . Chest Pain  . Abdominal Pain  . Foot Pain    William Ali is a 34 y.o. male history of metastatic adenocarcinoma, hypertension.  Patient presents today for chest pain shortness of breath onset 2 days ago gradually worsening described as a sharp pain in the center of his chest worsened with deep breathing no alleviating factors pain occasionally radiate up to his neck.  Associated with shortness of breath and cough/scant hemoptysis.  Additionally patient reports he was diagnosed around 1-1/2 weeks ago with a DVT, he has not taken his Xarelto in the past 2 days as he is having a port placed yesterday.  He reports he was supposed to start taking his Xarelto again today but due to pain he did not.  Additionally patient reports generalized abdominal pain, left shoulder pain and left foot pain described as aching constant moderate intensity worsened with movement and palpation no alleviating factors.  Additionally he reports a chronic headache which is unchanged from prior due to metastatic disease.  He denies fever/chills, fall/injury, dysuria/hematuria, nausea, vomiting, diarrhea or any additional concerns.  HPI     Past Medical History:  Diagnosis Date  . Back pain   . Cancer ([place])   . Hypertension     Patient Active Problem List   Diagnosis Date Noted  . Goals of care, counseling/discussion 05/21/2019  . 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 Adenocarcinoma of lung ([place]) 05/21/2019    Past Surgical History:  Procedure Laterality Date  . IR IMAGING GUIDED PORT INSERTION  06/12/2019  . KNEE SURGERY         Family History  Problem Relation Age of Onset  . Hypertension Mother   . Diabetes Father     Social History   Tobacco Use  . Smoking status: Never Smoker  . Smokeless  tobacco: Never Used  Substance Use Topics  . Alcohol use: No  . Drug use: No    Home Medications Prior to Admission medications   Medication Sig Start Date End Date Taking? Authorizing Provider  albuterol (VENTOLIN HFA) 108 (90 Base) MCG/ACT inhaler Inhale 2 puffs into the lungs every 6 (six) hours as needed for wheezing or shortness of breath. 05/19/19   Orson Slick, MD  benzonatate (TESSALON) 100 MG capsule Take 1 capsule (100 mg total) by mouth every 8 (eight) hours. Patient not taking: Reported on 05/25/2019 05/05/19   Darr, Marguerita Beards, PA-C  cetirizine (ZYRTEC) 10 MG tablet Take 1 tablet (10 mg total) by mouth daily. Patient not taking: Reported on 06/02/2019 11/23/18   Augusto Gamble B, NP  dexamethasone (DECADRON) 4 MG tablet Take 2 tablets (8 mg total) by mouth daily. 05/19/19   Orson Slick, MD  dexamethasone (DECADRON) 4 MG tablet Take 1 tablet (4 mg total) by mouth 2 (two) times daily with a meal. Starting on 5/11, and drop dose to once daily on 5/14 06/06/19   Hayden Pedro, PA-C  gabapentin (NEURONTIN) 600 MG tablet 1 PO QHS, may increase to 1 PO BID if needed. 06/09/19   Tanner, Lyndon Code., PA-C  hydrochlorothiazide (HYDRODIURIL) 25 MG tablet Take 1 tablet (25 mg total) by mouth daily. 07/21/18   Lamptey, Myrene Galas, MD  HYDROmorphone (DILAUDID) 2 MG tablet 1 to 2 tablets q 4 hours prn pain. 06/09/19  Harle Stanford., PA-C  LORazepam (ATIVAN) 0.5 MG tablet 1 tab po q 4-6 hours prn or 1 tab po 30 minutes prior to radiation Patient not taking: Reported on 06/02/2019 05/25/19   Hayden Pedro, PA-C  ondansetron (ZOFRAN) 8 MG tablet Take 1 tablet (8 mg total) by mouth every 8 (eight) hours as needed for nausea or vomiting. Patient not taking: Reported on 06/02/2019 05/30/19   Hayden Pedro, PA-C  pantoprazole (PROTONIX) 40 MG tablet Take 40 mg by mouth daily. 04/11/19   [provider]  RIVAROXABAN Alveda Reasons) VTE STARTER PACK (15 & 20 MG TABLETS) Follow package  directions: Take one 15mg  tablet by mouth twice a day. On day 22, switch to one 20mg  tablet once a day. Take with food. 06/04/19   Dorie Rank, MD  sodium chloride (OCEAN) 0.65 % SOLN nasal spray Place 1 spray into both nostrils as needed for congestion. Patient not taking: Reported on 06/02/2019 05/05/19   Darr, Marguerita Beards, PA-C  amLODipine (NORVASC) 5 MG tablet Take 1 tablet (5 mg total) by mouth daily. 04/21/18 07/21/18  Robyn Haber, MD  famotidine (PEPCID) 20 MG tablet Take 1 tablet (20 mg total) by mouth 2 (two) times daily. Patient taking differently: Take 20 mg by mouth 2 (two) times daily as needed for heartburn.  04/21/18 07/21/18  Robyn Haber, MD    Allergies    Onion, Other, Peanut oil, and Peanut-containing drug products  Review of Systems   Review of Systems Ten systems are reviewed and are negative for acute change except as noted in the HPI  Physical Exam Updated Vital Signs Pulse (!) 117   Temp (!) 97.5 F (36.4 C) (Oral)   Resp (!) 23   Ht 5\' 7"  (1.702 m)   Wt 93.7 kg   SpO2 100%   BMI 32.35 kg/m   Physical Exam Constitutional:      General: He is not in acute distress.    Appearance: Normal appearance. He is well-developed. He is obese. He is ill-appearing. He is not toxic-appearing or diaphoretic.  HENT:     Head: Normocephalic and atraumatic.     Right Ear: External ear normal.     Left Ear: External ear normal.     Nose: Nose normal.  Eyes:     General: Vision grossly intact. Gaze aligned appropriately.     Pupils: Pupils are equal, round, and reactive to light.  Neck:     Trachea: Trachea and phonation normal. No tracheal deviation.  Cardiovascular:     Rate and Rhythm: Regular rhythm. Tachycardia present.  Pulmonary:     Effort: Pulmonary effort is normal. No respiratory distress.  Chest:     Chest wall: No tenderness.  Abdominal:     General: There is no distension.     Palpations: Abdomen is soft.     Tenderness: There is generalized abdominal  tenderness. There is no guarding or rebound. Negative signs include Murphy's sign and McBurney's sign.  Musculoskeletal:        General: Normal range of motion.     Cervical back: Normal range of motion.  Skin:    General: Skin is warm and dry.  Neurological:     Mental Status: He is alert.     GCS: GCS eye subscore is 4. GCS verbal subscore is 5. GCS motor subscore is 6.     Comments: Speech is clear and goal oriented, follows commands Major Cranial nerves without deficit, no facial droop Moves extremities without  ataxia, coordination intact  Psychiatric:        Behavior: Behavior normal.     ED Results / Procedures / Treatments   Labs (all labs ordered are listed, but only abnormal results are displayed) Labs Reviewed  SARS CORONAVIRUS 2 BY RT PCR (HOSPITAL ORDER, [facility])  BASIC METABOLIC PANEL  CBC  HEPATIC FUNCTION PANEL  LIPASE, BLOOD  I-STAT CHEM 8, ED  TROPONIN I (HIGH SENSITIVITY)    EKG EKG Interpretation  Date/Time:  Tuesday Jun 13 2019 10:05:03 EDT Ventricular Rate:  121 PR Interval:    QRS Duration: 82 QT Interval:  295 QTC Calculation: 419 R Axis:   17 Text Interpretation: Sinus tachycardia Left atrial enlargement Nonspecific T abnormalities, lateral leads agree. no change except increased rate Confirmed by Charlesetta Shanks [id number]) on 06/13/2019 10:52:45 AM   Radiology IR IMAGING GUIDED PORT INSERTION  Result Date: 06/12/2019 INDICATION: 35 year old male with a history of metastatic right upper lobe lung cancer. He presents for port catheter placement. EXAM: IMPLANTED PORT A CATH PLACEMENT WITH ULTRASOUND AND FLUOROSCOPIC GUIDANCE MEDICATIONS: Ancef 2 g; The antibiotic was administered within an appropriate time interval prior to skin puncture. ANESTHESIA/SEDATION: Versed 4 mg IV; Fentanyl 100 mcg IV; Moderate Sedation Time:  19 minutes The patient was continuously monitored during the procedure by the interventional radiology  nurse under my direct supervision. FLUOROSCOPY TIME:  0 minutes, 30 seconds (7 mGy) COMPLICATIONS: None immediate. PROCEDURE: The right neck and chest was prepped with chlorhexidine, and draped in the usual sterile fashion using maximum barrier technique (cap and mask, sterile gown, sterile gloves, large sterile sheet, hand hygiene and cutaneous antiseptic). Local anesthesia was attained by infiltration with 1% lidocaine with epinephrine. Ultrasound demonstrated patency of the right internal jugular vein, and this was documented with an image. Under real-time ultrasound guidance, this vein was accessed with a 21 gauge micropuncture needle and image documentation was performed. A small dermatotomy was made at the access site with an 11 scalpel. A 0.018" wire was advanced into the SVC and the access needle exchanged for a 61F micropuncture vascular sheath. The 0.018" wire was then removed and a 0.035" wire advanced into the IVC. An appropriate location for the subcutaneous reservoir was selected below the clavicle and an incision was made through the skin and underlying soft tissues. The subcutaneous tissues were then dissected using a combination of blunt and sharp surgical technique and a pocket was formed. A single lumen power injectable portacatheter was then tunneled through the subcutaneous tissues from the pocket to the dermatotomy and the port reservoir placed within the subcutaneous pocket. The venous access site was then serially dilated and a peel away vascular sheath placed over the wire. The wire was removed and the port catheter advanced into position under fluoroscopic guidance. The catheter tip is positioned in the superior cavoatrial junction. This was documented with a spot image. The portacatheter was then tested and found to flush and aspirate well. The port was flushed with saline followed by 100 units/mL heparinized saline. The pocket was then closed in two layers using first subdermal inverted  interrupted absorbable sutures followed by a running subcuticular suture. The epidermis was then sealed with Dermabond. The dermatotomy at the venous access site was also closed with Dermabond. IMPRESSION: Successful placement of a right IJ approach Power Port with ultrasound and fluoroscopic guidance. The catheter is ready for use. Electronically Signed   By: Jacqulynn Cadet M.D.   On: 06/12/2019 16:26  Procedures .Critical Care Performed by: Deliah Boston, PA-C Authorized by: Deliah Boston, PA-C   Critical care provider statement:    Critical care time (minutes):  40   Critical care was time spent personally by me on the following activities:  Discussions with consultants, evaluation of patient's response to treatment, examination of patient, ordering and performing treatments and interventions, ordering and review of laboratory studies, ordering and review of radiographic studies, pulse oximetry, re-evaluation of patient's condition, obtaining history from patient or surrogate, review of old charts and development of treatment plan with patient or surrogate   (including critical care time)  Medications Ordered in ED Medications  sodium chloride flush (NS) 0.9 % injection 3 mL (has no administration in time range)  morphine 4 MG/ML injection 4 mg (has no administration in time range)  ondansetron (ZOFRAN) injection 4 mg (has no administration in time range)    ED Course  I have reviewed the triage vital signs and the nursing notes.  Pertinent labs & imaging results that were available during my care of the patient were reviewed by me and considered in my medical decision making (see chart for details).  Clinical Course as of Jun 12 1457  Tue Jun 13, 2019  1451 Dr. Zigmund Daniel   [BM]  1457 Dr. Vaughan Browner   [BM]    Clinical Course User Index [BM] Gari Crown   MDM Rules/Calculators/A&P                     Additional History Obtained: 1. Nursing notes from  this visit. 2. Prior ED visit on Jun 04, 2019.  Patient was diagnosed with acute DVT of the left lower extremity and was started on Xarelto. 3. Previous records within EMR system, reviewed assessment and plan from oncology yesterday, left patient on Decadron for headaches, he is undergoing whole brain radiation therapy. - Upon initial evaluation patient is ill-appearing, tachycardic, tachypneic endorsing chest pain shortness of breath and hemoptysis.  He has not been taking Xarelto for the last 2-3 days and was diagnosed around 1.5 weeks ago with an acute DVT.  Additionally he is endorsing abdominal pain, to triage nurse it appeared to be right lower quadrant pain, however on exam is more generalized without focal tenderness, additionally no nausea vomiting or diarrhea.  Primary concern at this time is for possible pulmonary embolism given known DVT and not currently anticoagulated.  I have ordered labs including i-STAT Chem-8, CBC, BMP, LFTs, lipase, urinalysis, troponin, lactic acid, blood cultures and Covid test.  Additionally I ordered portable chest x-ray and CT angio PE study.  Morphine/Zofran ordered for pain and nausea.  Discussed case with Dr. Johnney Killian, will start patient on heparin empirically for concern of pulmonary embolism and monitor. - I have reviewed and interpreted the following labs: Lactic acid 2.0, borderline.  Troponin within normal limits suspicion for ACS.  CBC shows no leukocytosis to suggest infection, hemoglobin of 12.8 slightly decreased from prior, thrombocytopenia of 98 is consistently decreasing may be due to patient's cancer treatments.  BMP shows creatinine of 1.34 which appears similar to prior, no emergent electrolyte derangement.  Lipase within normal limit no evidence of pancreatitis.  LFTs appear elevated at baseline.  Chest x-ray:  IMPRESSION:  Port-A-Cath tip in superior vena cava. No pneumothorax. Lungs clear.  Cardiac silhouette within normal limits.  I personally  reviewed the chest x-ray and agree with radiologist interpretation.  EKG: Sinus tachycardia Left atrial enlargement Nonspecific T abnormalities, lateral leads  agree. no change except increased rate Confirmed by Charlesetta Shanks [phone]) on 06/13/2019 10:52:45 AM - Patient was reevaluated multiple times, remains mildly tachycardic but blood pressure stable and not requiring any supplemental oxygen. - CTA PE Study:  IMPRESSION:  1. Progressive right hilar adenopathy/soft tissue density which  causes severe narrowing of the right upper lobe pulmonary arteries  which are attenuated. Small segmental pulmonary emboli in the right  lower lobe are adjacent to adenopathy, may represent direct invasion  versus did no novo clot. Thromboembolic burden is small. No  pulmonary embolus on the left, however breathing motion artifact and  contrast bolus timing limits detailed assessment.  2. Spiculated right upper lobe cavitary nodule with decreased  peripheral soft tissue component from prior exam and slight decrease  in size.  3. Progressive right hilar adenopathy/soft tissue density causes  severe narrowing of the right upper lobe bronchus. There may be an  element of post treatment related change, however some of the  mediastinal nodes have increased in size suspicious for progression  in disease.  4. Additional small pulmonary nodules both lungs are unchanged from  exam last month.  5. Osseous metastatic disease is grossly unchanged. Known hepatic  metastatic disease is not well delineated given phase of contrast  - 2:12 PM: Patient updated on findings as above and states understanding.  Discussed results with Dr. Johnney Killian, given concern of possible direct invasion into the pulmonary artery decision was made to discontinue heparin given small clot burden.  I then asked Gerald Stabs RN to discontinue heparin.  Awaiting call from hospitalist service for admission. - 2:51 PM: I discussed case with Dr. Zigmund Daniel  who has accepted patient to hospitalist service.  She asked that I speak with PCCM regarding heparin. - 2:58 PM: Discussed case with critical care Dr. Vaughan Browner who will be by to see patient. - 3:06 PM: Informed Dr. Zigmund Daniel with Dr. Vaughan Browner to see patient.   Note: Portions of this report may have been transcribed using voice recognition software. Every effort was made to ensure accuracy; however, inadvertent computerized transcription errors may still be present. Final Clinical Impression(s) / ED Diagnoses Final diagnoses:  None    Rx / DC Orders ED Discharge Orders    None       Gari Crown 06/13/19 [place], MD 06/14/19 [phone]

## 2019-06-13 NOTE — Progress Notes (Signed)
ANTICOAGULATION CONSULT NOTE - Initial Consult  Pharmacy Consult for IV heparin Indication: recent DVT, suspicion for PE  Allergies  Allergen Reactions  . Onion Anaphylaxis  . Other     All sea food causes swelling and a rash.    . Peanut Oil Itching  . Peanut-Containing Drug Products Itching    Patient Measurements: Height: 5\' 7"  (170.2 cm) Weight: 93.7 kg (206 lb 9.1 oz) IBW/kg (Calculated) : 66.1 Heparin Dosing Weight: 85.9 kg  Vital Signs: Temp: 97.5 F (36.4 C) (05/18 0959) Temp Source: Oral (05/18 0959) BP: 137/92 (05/18 1045) Pulse Rate: 111 (05/18 1045)  Labs: No results for input(s): HGB, HCT, PLT, APTT, LABPROT, INR, HEPARINUNFRC, HEPRLOWMOCWT, CREATININE, CKTOTAL, CKMB, TROPONINIHS in the last 72 hours.  Estimated Creatinine Clearance: 77.2 mL/min (A) (by C-G formula based on SCr of 1.47 mg/dL (H)).   Medical History: Past Medical History:  Diagnosis Date  . Back pain   . Cancer ([place])   . Hypertension     Medications:  PTA Xarelto starter pack-patient/family report he took only one 15mg  tablet daily on 06/04/19-06/06/19 due to confusion with instructions, then started 15mg  tablet twice daily appropriately 06/07/19-06/10/19 PM. Stopped after 06/10/19 8pm dose due to port placement on 06/12/19.   Assessment: 37 y/oM with adenocarcinoma of the lung with metastatic disease to the brain and spine presents to [facility] ED with chest pain, RLQ abdominal pain, headache, and left foot pain. Patient diagnosed with DVT on 06/04/2019, discharged from ED with Xarelto starter pack at that time. Pharmacy consulted for IV heparin dosing for recent DVT and now concern for PE. CTa chest pending. Note that heparin level can be falsely elevated due to recent Xarelto use.   Goal of Therapy:  APTT 66-102 seconds Heparin level 0.3-0.7 units/ml Monitor platelets by anticoagulation protocol: Yes   Plan:   Baseline heparin level, aPTT, PT/INR, CBC  After baseline labs collected, start  heparin 2450 units IV bolus x 1, then infusion at 1550 units/hr  Will dose/titrate using aPTT until heparin level and aPTT correlate; thereafter will use heparin levels  APTT 6 hours after heparin initiation  Daily CBC, heparin level, aPTT  Monitor closely for s/sx of bleeding    Lindell Spar, PharmD, BCPS Clinical Pharmacist  06/13/2019,11:11 AM

## 2019-06-13 NOTE — ED Triage Notes (Signed)
Patient c/o mid chest pain x 2 days. Patient also c/o RLQ abdominal pain. Patient c/o headache, left foot pain , dry mouth. Patient states he was recently diagnosed with a DVT.

## 2019-06-13 NOTE — ED Notes (Signed)
Port a Cath accessed by IV team.

## 2019-06-13 NOTE — ED Notes (Signed)
Attempted to call report, phone continued to ring. Will attempt again shortly.

## 2019-06-14 ENCOUNTER — Ambulatory Visit: Payer: PRIVATE HEALTH INSURANCE

## 2019-06-14 ENCOUNTER — Telehealth: Payer: Self-pay | Admitting: Hematology and Oncology

## 2019-06-14 ENCOUNTER — Inpatient Hospital Stay: Payer: Medicaid Other

## 2019-06-14 LAB — COMPREHENSIVE METABOLIC PANEL
ALT: 130 U/L — ABNORMAL HIGH (ref 0–44)
AST: 49 U/L — ABNORMAL HIGH (ref 15–41)
Albumin: 3.1 g/dL — ABNORMAL LOW (ref 3.5–5.0)
Alkaline Phosphatase: 322 U/L — ABNORMAL HIGH (ref 38–126)
Anion gap: 11 (ref 5–15)
BUN: 28 mg/dL — ABNORMAL HIGH (ref 6–20)
CO2: 24 mmol/L (ref 22–32)
Calcium: 9.1 mg/dL (ref 8.9–10.3)
Chloride: 106 mmol/L (ref 98–111)
Creatinine, Ser: 1.34 mg/dL — ABNORMAL HIGH (ref 0.61–1.24)
GFR calc Af Amer: >60 mL/min (ref >60)
GFR calc non Af Amer: >60 mL/min (ref >60)
Glucose, Bld: 112 mg/dL — ABNORMAL HIGH (ref 70–99)
Potassium: 4.4 mmol/L (ref 3.5–5.1)
Sodium: 141 mmol/L (ref 135–145)
Total Bilirubin: 0.9 mg/dL (ref 0.3–1.2)
Total Protein: 6.2 g/dL — ABNORMAL LOW (ref 6.5–8.1)

## 2019-06-14 LAB — CBC
HCT: 36.7 % — ABNORMAL LOW (ref 39.0–52.0)
Hemoglobin: 11.7 g/dL — ABNORMAL LOW (ref 13.0–17.0)
MCH: 27.4 pg (ref 26.0–34.0)
MCHC: 31.9 g/dL (ref 30.0–36.0)
MCV: 85.9 fL (ref 80.0–100.0)
Platelets: 89 10*3/uL — ABNORMAL LOW (ref 150–400)
RBC: 4.27 MIL/uL (ref 4.22–5.81)
RDW: 16.5 % — ABNORMAL HIGH (ref 11.5–15.5)
WBC: 4.7 10*3/uL (ref 4.0–10.5)
nRBC: 0.6 % — ABNORMAL HIGH (ref 0.0–0.2)

## 2019-06-14 LAB — APTT
aPTT: 186 seconds (ref 24–36)
aPTT: >200 seconds (ref 24–36)
aPTT: 81 seconds — ABNORMAL HIGH (ref 24–36)

## 2019-06-14 MED ORDER — ALUM & MAG HYDROXIDE-SIMETH 200-200-20 MG/5ML PO SUSP
30.0000 mL | Freq: Four times a day (QID) | ORAL | Status: DC | PRN
  Administered 2019-06-14: 30 mL via ORAL
  Filled 2019-06-14: qty 30

## 2019-06-14 MED ORDER — DEXAMETHASONE SODIUM PHOSPHATE 4 MG/ML IJ SOLN
4.0000 mg | INTRAMUSCULAR | Status: DC
  Administered 2019-06-15 – 2019-06-16 (×2): 4 mg via INTRAVENOUS
  Filled 2019-06-14 (×3): qty 1

## 2019-06-14 MED ORDER — HEPARIN (PORCINE) 25000 UT/250ML-% IV SOLN
800.0000 [IU]/h | INTRAVENOUS | Status: DC
  Administered 2019-06-14: 800 [IU]/h via INTRAVENOUS

## 2019-06-14 NOTE — Plan of Care (Signed)
  Problem: Clinical Measurements: Goal: Cardiovascular complication will be avoided Outcome: Progressing   Problem: Nutrition: Goal: Adequate nutrition will be maintained Outcome: Progressing   Problem: Elimination: Goal: Will not experience complications related to urinary retention Outcome: Progressing   Problem: Skin Integrity: Goal: Risk for impaired skin integrity will decrease Outcome: Progressing

## 2019-06-14 NOTE — Progress Notes (Signed)
Chaplain rec'd referral from nurse huddle. Patient was unavailable when chaplain came by. Patient had staff in room when visit was attempted. Will follow up on Friday if patient available. Rev. Tamsen Snider

## 2019-06-14 NOTE — Telephone Encounter (Signed)
R/s appt per 5/18 sch message - pt aware of rescheduled appt.

## 2019-06-14 NOTE — Progress Notes (Addendum)
   NAME:  William Ali, MRN:  [id number], DOB:  [birth year], LOS: 1 ADMISSION DATE:  06/13/2019, CONSULTATION DATE:  06/13/2019 REFERRING MD:  Jacki Cones MD, CHIEF COMPLAINT:  PE  Brief History   34 year old with history of recently diagnosed metastatic lung cancer diagnosed with liver biopsy on chemotherapy/immunotherapy/hormonal therapy with whole brain and spine radiation.  Diagnosed with DVT on 5/9.  He was started on Xarelto but then was held as he needed to have a Port-A-Cath placed.  He has been having mild hemoptysis with coughing but no major bleed. Admitted with chest, abdominal pain. CTA shows worsening progressive hilar adenopathy, small segmental PE in the right.  PCCM consulted due to concern for tumor invasion and recommendations for anticoagulation in the setting.  Past Medical History   Back pain, hypertension, metastatic adenocarcinoma.  Significant Hospital Events    5/18- Admit  Consults:  PCCM  Procedures:    Significant Diagnostic Tests:   CTA 06/13/19-progressive right hilar adenopathy, spiculated right upper lobe nodule decreased in size.  Small segmental emboli in the right lower lobe.  May represent direct invasion versus de novo clot.  Micro Data:    Antimicrobials:    Interim history/subjective:   Started on heparin overnight.  No complaints today.  Does not report any hemoptysis.  Objective   Blood pressure (!) 145/78, pulse 84, temperature 98 F (36.7 C), temperature source Oral, resp. rate 20, height 5' 7.01" (1.702 m), weight 93.7 kg, SpO2 100 %.        Intake/Output Summary (Last 24 hours) at 06/14/2019 1625 Last data filed at 06/14/2019 1400 Gross per 24 hour  Intake 2854.95 ml  Output 825 ml  Net 2029.95 ml   Filed Weights   06/13/19 1012  Weight: 93.7 kg    Examination: Blood pressure (!) 145/78, pulse 84, temperature 98 F (36.7 C), temperature source Oral, resp. rate 20, height 5' 7.01" (1.702 m), weight 93.7 kg, SpO2  100 %. Gen:      No acute distress HEENT:  EOMI, sclera anicteric Neck:     No masses; no thyromegaly Lungs:    Clear to auscultation bilaterally; normal respiratory effort CV:         Regular rate and rhythm; no murmurs Abd:      + bowel sounds; soft, non-tender; no palpable masses, no distension Ext:    No edema; adequate peripheral perfusion Skin:      Warm and dry; no rash Neuro: alert and oriented x 3 Psych: normal mood and affect  Resolved Hospital Problem list     Assessment & Plan:  Metastatic adenocarcinoma DVT, PE.  On Xarelto as outpatient Now with chest pain, filling defects on CT representing clot versus tumor invasion  I have reviewed the CT with radiology.  It is hard to tell if this is actually tumor invasion and appearance favors venous thromboembolism Started on heparin with close monitoring.  Can consider transitioning to oral anticoagulants tomorrow.  If he does develop hemoptysis then will need to do an IVC filter.  Discussed with patient and his wife at bedside.  Best practice:  Diet: PO Pain/Anxiety/Delirium protocol (if indicated): NA VAP protocol (if indicated): NA DVT prophylaxis: Heparin GI prophylaxis: PPI Glucose control: Monitor Mobility: OOB Code Status: Full Family Communication: Pt and wife updated Disposition: Tele  Critical care time: NA   Marshell Garfinkel MD [facility] Please see [email or web address] for pager details.  06/14/2019, 4:25 PM

## 2019-06-14 NOTE — Progress Notes (Signed)
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  Patient reportedly had a skilled 2 doses of Xarelto in preparation for his port placement on 06/12/19. In ED on evaluation CT chest with and without contrast showed progressive right hilar adenopathy/soft tissue density which causes severe narrowing of the right upper lobe pulmonary arteries which are attenuated.  A small segmental pulmonary emboli in the right lower lobe are absent adenopathy, may represent direct invasion versus de novo clot, thromboembolic but has a small, no PE on the left however breathing motion artifact and contrast bolus timing limits detail assessment.  Pulmonary was consulted.  Patient was admitted for further management.  Subjective: Seen this morning, significant other at the bedside, patient was having headache, got some pain medicine and then had some abdominal discomfort which she has been having since the steroid use.  No other new complaints.   Assessment & Plan:  DVT/PE on Xarelto as outpatient.  CT was reviewed by pulmonary with radiology hard to tell if actually tumor invasion and appearance favors venous thromboembolism so pulmonary recommended heparin with no bolus and close monitoring.  If patient develops hematemesis then will need an IVC filter.  Small brownish sputum this morning no obvious hemoptysis.  Continue current heparin drip and monitor closely.  Pharmacy dosing heparin based on PTT.  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  Patient is followed by Dr. Lorenso Courier and [name].  Headache from mets on Decadron.  Continue PPI as patient complains of epigastric abdominal discomfort since being on steroid.  Continue Dilaudid.  Essential hypertension: Well-controlled on, on metoprolol started here holding home HCTZ due to risk of dehydration.  Thrombocytopenia platelet at 89,000 monitor closely on heparin.  Previously platelet count of 114-148,000 in May back in April was in 300,000 likely from chemo/mets  Lactic acidosis-from dehydration and patient thromboembolism.  At this time blood pressure stable saturating 100% room air.  DVT prophylaxis:Heparin Code Status:FULL  Family Communication: plan of care discussed with patient and his wife at bedside.  Status is: Inpatient Remains inpatient appropriate because:Inpatient level of care appropriate due to severity of illness and For ongoing IV heparin infusion for PE treatment  Dispo: The patient is from: Home              Anticipated d/c is to: Home              Anticipated d/c date is: 2 days              Patient currently is not medically stable to d/c.  Diet Order            Diet regular Room service appropriate? Yes; Fluid consistency: Thin  Diet effective now              Consultants:see note  Procedures:see note Microbiology:see note Medications: Scheduled Meds: . Chlorhexidine Gluconate Cloth  6 each Topical Daily  . dexamethasone  4 mg Oral Q breakfast  .  metoprolol tartrate  12.5 mg Oral BID  . pantoprazole  40 mg Oral Daily  . sodium chloride flush  10-40 mL Intracatheter Q12H   Continuous Infusions: . sodium chloride 100 mL/hr at 06/14/19 1227  . heparin 800 Units/hr (06/14/19 1229)    Antimicrobials: Anti-infectives (From admission, onward)   None    Objective: Vitals: Today's Vitals   06/14/19 0813 06/14/19 1237 06/14/19 1307  06/14/19 1315  BP:    (!) 145/78  Pulse:    84  Resp:    20  Temp:    98 F (36.7 C)  TempSrc:    Oral  SpO2:    100%  Weight:      Height:      PainSc: Asleep 6  4      Intake/Output Summary (Last 24 hours) at 06/14/2019 1345 Last data filed at 06/14/2019 1240 Gross per 24 hour  Intake 3004.57 ml  Output 825 ml  Net 2179.57 ml   Filed Weights   06/13/19 1012  Weight: 93.7 kg   Weight change:    Intake/Output from previous day: 05/18 0701 - 05/19 0700 In: 1790 [I.V.:1290; IV Piggyback:500] Out: 825 [Urine:825] Intake/Output this shift: Total I/O In: 1214.6 [P.O.:540; I.V.:674.6] Out: -   Examination:  General exam: AAOx3, ill looking, on RA HEENT:Oral mucosa moist, Ear/Nose WNL grossly,dentition normal. Respiratory system: bilaterally diminished in past but no crackles and no wheezing,no use of accessory muscle, non tender. Cardiovascular system: S1 & S2 +, regular, No JVD. Gastrointestinal system: Abdomen soft, NT,ND, BS+. Nervous System:Alert, awake, moving extremities and grossly nonfocal Extremities: No edema, distal peripheral pulses palpable.  Skin: No rashes,no icterus. MSK: Normal muscle bulk,tone, power  Data Reviewed: I have personally reviewed following labs and imaging studies CBC: Recent Labs  Lab 06/13/19 1153 06/14/19 0250  WBC 5.5 4.7  NEUTROABS 4.5  --   HGB 12.8* 11.7*  HCT 39.9 36.7*  MCV 84.5 85.9  PLT 98* 89*   Basic Metabolic Panel: Recent Labs  Lab 06/13/19 1153 06/14/19 0250  NA 139 141  K 3.8 4.4  CL 102 106  CO2 24 24  GLUCOSE 110* 112*  BUN 35* 28*  CREATININE 1.34* 1.34*  CALCIUM 9.2 9.1   GFR: Estimated Creatinine Clearance: 84.7 mL/min (A) (by C-G formula based on SCr of 1.34 mg/dL (H)). Liver Function Tests: Recent Labs  Lab 06/13/19 1153 06/14/19 0250  AST 62* 49*  ALT 153* 130*  ALKPHOS 374* 322*  BILITOT 1.0 0.9  PROT 7.0 6.2*  ALBUMIN 3.6 3.1*   Recent Labs  Lab 06/13/19 1153  LIPASE 28   No  results for input(s): AMMONIA in the last 168 hours. Coagulation Profile: Recent Labs  Lab 06/13/19 1153  INR 1.4*   Cardiac Enzymes: No results for input(s): CKTOTAL, CKMB, CKMBINDEX, TROPONINI in the last 168 hours. BNP (last 3 results) No results for input(s): PROBNP in the last 8760 hours. HbA1C: No results for input(s): HGBA1C in the last 72 hours. CBG: No results for input(s): GLUCAP in the last 168 hours. Lipid Profile: No results for input(s): CHOL, HDL, LDLCALC, TRIG, CHOLHDL, LDLDIRECT in the last 72 hours. Thyroid Function Tests: No results for input(s): TSH, T4TOTAL, FREET4, T3FREE, THYROIDAB in the last 72 hours. Anemia Panel: No results for input(s): VITAMINB12, FOLATE, FERRITIN, TIBC, IRON, RETICCTPCT in the last 72 hours. Sepsis Labs: Recent Labs  Lab 06/13/19 1153 06/13/19 1852  LATICACIDVEN 2.0* 2.5*    Recent Results (from the past 240 hour(s))  SARS  Coronavirus 2 by RT PCR (hospital order, performed in [facility] hospital lab) Nasopharyngeal Nasopharyngeal Swab     Status: None   Collection Time: 06/13/19  1:42 PM   Specimen: Nasopharyngeal Swab  Result Value Ref Range Status   SARS Coronavirus 2 NEGATIVE NEGATIVE Final    Comment: (NOTE) SARS-CoV-2 target nucleic acids are NOT DETECTED. The SARS-CoV-2 RNA is generally detectable in upper and lower respiratory specimens during the acute phase of infection. The lowest concentration of SARS-CoV-2 viral copies this assay can detect is 250 copies / mL. A negative result does not preclude SARS-CoV-2 infection and should not be used as the sole basis for treatment or other patient management decisions.  A negative result may occur with improper specimen collection / handling, submission of specimen other than nasopharyngeal swab, presence of viral mutation(s) within the areas targeted by this assay, and inadequate number of viral copies (<250 copies / mL). A negative result must be combined with  clinical observations, patient history, and epidemiological information. Fact Sheet for Patients:   StrictlyIdeas.no Fact Sheet for Healthcare Providers: BankingDealers.co.za This test is not yet approved or cleared  by the [place] FDA and has been authorized for detection and/or diagnosis of SARS-CoV-2 by FDA under an Emergency Use Authorization (EUA).  This EUA will remain in effect (meaning this test can be used) for the duration of the COVID-19 declaration under Section 564(b)(1) of the Act, 21 U.S.C. section 360bbb-3(b)(1), unless the authorization is terminated or revoked sooner. Performed at [facility], [place] [address] [place], [place] [address]   Blood culture (routine x 2)     Status: None (Preliminary result)   Collection Time: 06/13/19  6:35 PM   Specimen: BLOOD  Result Value Ref Range Status   Specimen Description   Final    BLOOD PORTA CATH Performed at [facility] [address] [place], [place] [address]    Special Requests   Final    BOTTLES DRAWN AEROBIC AND ANAEROBIC Blood Culture adequate volume Performed at [facility] [address], [place], [place] [address]    Culture   Final    NO GROWTH < 12 HOURS Performed at [facility] [address], [place], [place] [address]    Report Status PENDING  Incomplete      Radiology Studies: CT Angio Chest PE W and/or Wo Contrast  Result Date: 06/13/2019 CLINICAL DATA:  Shortness of breath. Chest pain. Metastatic lung carcinoma. Active chemotherapy and radiation. EXAM: CT ANGIOGRAPHY CHEST WITH CONTRAST TECHNIQUE: Multidetector CT imaging of the chest was performed using the standard protocol during bolus administration of intravenous contrast. Multiplanar CT image reconstructions and MIPs were obtained to evaluate the vascular anatomy. CONTRAST:  154mL OMNIPAQUE IOHEXOL 350 MG/ML SOLN COMPARISON:   Chest radiograph earlier this day. Most recent chest CT 05/10/2019 FINDINGS: Cardiovascular: Right hilar adenopathy/soft tissue density causes severe narrowing of the right upper lobe pulmonary arteries which are attenuated. This causes mass effect on the distal main right pulmonary artery. Circumferential soft tissue density involving the right lower lobar pulmonary artery. Filling defects within the segmental branches of the right lower lobe pulmonary arteries are eccentric and may be due to direct invasion or de novo pulmonary embolus, for example series 6, image 145 and 149. No definite pulmonary emboli on the left, however breathing motion artifact and contrast bolus timing limits detailed assessment. Thoracic aorta is normal in caliber without dissection. Right chest port tip in the  lower SVC. Heart is normal in size. No pericardial effusion. Mediastinum/Nodes: Progressive right hilar adenopathy/soft tissue density causes severe narrowing of the right upper lobe pulmonary arteries. This is increased from prior exam. Representative right hilar lymph node measures 22 mm, series 5, image 59, previously 19 mm. This is contiguous with right paratracheal soft tissue densities/adenopathy which is difficult to delineate. Right lower paratracheal node has increased currently 13 mm, series 5, image 52, previously 10 mm. High mediastinal node measures 11 mm, series 5, image 37, unchanged. No left hilar adenopathy. Esophagus slightly patulous without wall thickening. No visualized thyroid nodule. Lungs/Pleura: Spiculated right upper lobe cavitary nodule currently measuring approximately 3 x 2.6 cm with decreased peripheral soft tissue component from prior exam. This is contiguous with soft tissue density that extends to the right hilum. Severe narrowing of the right upper lobe bronchus. Ill-defined paramediastinal density in the right upper lobe is likely post treatment related change. Peripheral subpleural opacity in the  right upper lobe abutting, series 7, image 57 is nonspecific, and may represent pulmonary infarct, post treatment related change, or pneumonia. Small left apical nodule series 7, image 39 and 40 are unchanged. Small nodule in the superior segment of the left lower lobe is unchanged, series 7, image 55. 3 mm perifissural nodule in the right middle lobe, series 7, image 64, unchanged. No definite new nodule. No pleural fluid. Upper Abdomen: Known metastatic disease in the liver, not as well visualized on the current exam given phase of contrast. No acute abdominal findings. 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 T3 lesion causing mild pathologic compression fracture, similar to prior. No new spinal compression fractures. Review of the MIP images confirms the above findings. IMPRESSION: 1. Progressive right hilar adenopathy/soft tissue density which causes severe narrowing of the right upper lobe pulmonary arteries which are attenuated. Small segmental pulmonary emboli in the right lower lobe are adjacent to adenopathy, may represent direct invasion versus did no novo clot. Thromboembolic burden is small. No pulmonary embolus on the left, however breathing motion artifact and contrast bolus timing limits detailed assessment. 2. Spiculated right upper lobe cavitary nodule with decreased peripheral soft tissue component from prior exam and slight decrease in size. 3. Progressive right hilar adenopathy/soft tissue density causes severe narrowing of the right upper lobe bronchus. There may be an element of post treatment related change, however some of the mediastinal nodes have increased in size suspicious for progression in disease. 4. Additional small pulmonary nodules both lungs are unchanged from exam last month. 5. Osseous metastatic disease is grossly unchanged. Known hepatic metastatic disease is not well delineated given phase of contrast These results were called by telephone at the time  of interpretation on 06/13/2019 at 1:52 pm to [place] , who verbally acknowledged these results. Electronically Signed   By: Keith Rake M.D.   On: 06/13/2019 13:53   DG Chest Portable 1 View  Result Date: 06/13/2019 CLINICAL DATA:  Shortness of breath and chest pain EXAM: PORTABLE CHEST 1 VIEW COMPARISON:  Jun 02, 2019 FINDINGS: Port-A-Cath tip is in the superior vena cava. No pneumothorax. Lungs are clear. Heart size and pulmonary vascularity are normal. No adenopathy. No bone lesions. IMPRESSION: Port-A-Cath tip in superior vena cava. No pneumothorax. Lungs clear. Cardiac silhouette within normal limits. Electronically Signed   By: Lowella Grip III M.D.   On: 06/13/2019 10:54   IR IMAGING GUIDED PORT INSERTION  Result Date: 06/12/2019 INDICATION: 34 year old male with a history of metastatic  right upper lobe lung cancer. He presents for port catheter placement. EXAM: IMPLANTED PORT A CATH PLACEMENT WITH ULTRASOUND AND FLUOROSCOPIC GUIDANCE MEDICATIONS: Ancef 2 g; The antibiotic was administered within an appropriate time interval prior to skin puncture. ANESTHESIA/SEDATION: Versed 4 mg IV; Fentanyl 100 mcg IV; Moderate Sedation Time:  19 minutes The patient was continuously monitored during the procedure by the interventional radiology nurse under my direct supervision. FLUOROSCOPY TIME:  0 minutes, 30 seconds (7 mGy) COMPLICATIONS: None immediate. PROCEDURE: The right neck and chest was prepped with chlorhexidine, and draped in the usual sterile fashion using maximum barrier technique (cap and mask, sterile gown, sterile gloves, large sterile sheet, hand hygiene and cutaneous antiseptic). Local anesthesia was attained by infiltration with 1% lidocaine with epinephrine. Ultrasound demonstrated patency of the right internal jugular vein, and this was documented with an image. Under real-time ultrasound guidance, this vein was accessed with a 21 gauge micropuncture needle and image  documentation was performed. A small dermatotomy was made at the access site with an 11 scalpel. A 0.018" wire was advanced into the SVC and the access needle exchanged for a 67F micropuncture vascular sheath. The 0.018" wire was then removed and a 0.035" wire advanced into the IVC. An appropriate location for the subcutaneous reservoir was selected below the clavicle and an incision was made through the skin and underlying soft tissues. The subcutaneous tissues were then dissected using a combination of blunt and sharp surgical technique and a pocket was formed. A single lumen power injectable portacatheter was then tunneled through the subcutaneous tissues from the pocket to the dermatotomy and the port reservoir placed within the subcutaneous pocket. The venous access site was then serially dilated and a peel away vascular sheath placed over the wire. The wire was removed and the port catheter advanced into position under fluoroscopic guidance. The catheter tip is positioned in the superior cavoatrial junction. This was documented with a spot image. The portacatheter was then tested and found to flush and aspirate well. The port was flushed with saline followed by 100 units/mL heparinized saline. The pocket was then closed in two layers using first subdermal inverted interrupted absorbable sutures followed by a running subcuticular suture. The epidermis was then sealed with Dermabond. The dermatotomy at the venous access site was also closed with Dermabond. IMPRESSION: Successful placement of a right IJ approach Power Port with ultrasound and fluoroscopic guidance. The catheter is ready for use. Electronically Signed   By: Jacqulynn Cadet M.D.   On: 06/12/2019 16:26     LOS: 1 day   Antonieta Pert, MD [facility]  06/14/2019, 1:45 PM

## 2019-06-14 NOTE — Progress Notes (Signed)
ANTICOAGULATION CONSULT NOTE - Follow Up Consult  Pharmacy Consult for Heparin Indication: recent DVT, suspicion for PE  Allergies  Allergen Reactions  . Onion Anaphylaxis  . Other     All sea food causes swelling and a rash.    . Peanut Oil Itching  . Peanut-Containing Drug Products Itching    Patient Measurements: Height: 5' 7.01" (170.2 cm) Weight: 93.7 kg (206 lb 9.1 oz) IBW/kg (Calculated) : 66.12 Heparin Dosing Weight:   Vital Signs: Temp: 98 F (36.7 C) (05/19 0203) Temp Source: Oral (05/19 0203) BP: 120/76 (05/19 0203) Pulse Rate: 83 (05/19 0203)  Labs: Recent Labs    06/13/19 1153 06/13/19 1852 06/14/19 0250 06/14/19 0251  HGB 12.8*  --  11.7*  --   HCT 39.9  --  36.7*  --   PLT 98*  --  89*  --   APTT 31  --   --  186*  LABPROT 16.2*  --   --   --   INR 1.4*  --   --   --   HEPARINUNFRC 0.21*  --   --   --   CREATININE 1.34*  --  1.34*  --   TROPONINIHS 7 7  --   --     Estimated Creatinine Clearance: 84.7 mL/min (A) (by C-G formula based on SCr of 1.34 mg/dL (H)).   Medications:  Infusions:  . sodium chloride 100 mL/hr at 06/14/19 0219  . heparin 1,400 Units/hr (06/13/19 1924)    Assessment: Patient with high PTT.  No heparin issues per RN.  RN talked with patient to make sure labs drawn.  Goal of Therapy:  aPTT 66-84 seconds  (reduced goal) Monitor platelets by anticoagulation protocol: Yes   Plan:  Reduced heparin to 1250 units/hr Recheck PTT at 1000.  Nani Skillern [place] 06/14/2019,4:04 AM

## 2019-06-14 NOTE — Progress Notes (Signed)
   06/13/19 2202  Assess: MEWS Score  Temp 97.7 F (36.5 C)  BP 135/86  Pulse Rate (!) 103  Resp (!) 24  Level of Consciousness Alert  SpO2 100 %  O2 Device Room Air  Assess: MEWS Score  MEWS Temp 0  MEWS Systolic 0  MEWS Pulse 1  MEWS RR 1  MEWS LOC 0  MEWS Score 2  MEWS Score Color Yellow  Assess: if the MEWS score is Yellow or Red  Were vital signs taken at a resting state? No  Focused Assessment Documented focused assessment  Early Detection of Sepsis Score *See Row Information* Low  MEWS guidelines implemented *See Row Information* No, other (Comment) (pt in severe pain )  Treat  MEWS Interventions Administered prn meds/treatments  Escalate  MEWS: Escalate Yellow: discuss with charge nurse/RN and consider discussing with provider and RRT  Notify: Charge Nurse/RN  Name of Charge Nurse/RN Notified Duffy Rhody, RN  Date Charge Nurse/RN Notified 06/13/19  Time Charge Nurse/RN Notified 2225

## 2019-06-14 NOTE — TOC Initial Note (Signed)
Transition of Care [facility]) - Initial/Assessment Note    Patient Details  Name: William Ali MRN: [id number] Date of Birth: Feb 19, 1985  [facility] CM/SW Contact:    William Phi, RN Phone Number: 06/14/2019, 1:19 PM  Clinical Narrative:  PCP appt set,has pharmacy,can afford meds. No further CM needs.                 Expected Discharge Plan: Home/Self Care Barriers to Discharge: Continued Medical Work up   Patient Goals and CMS Choice Patient states their goals for this hospitalization and ongoing recovery are:: go home CMS Medicare.gov Compare Post Acute Care list provided to:: Other (Comment Required)(n/a)    Expected Discharge Plan and Services Expected Discharge Plan: Home/Self Care   Discharge Planning Services: CM Consult   Living arrangements for the past 2 months: Apartment                                      Prior Living Arrangements/Services Living arrangements for the past 2 months: Apartment Lives with:: Spouse Patient language and need for interpreter reviewed:: Yes Do you feel safe going back to the place where you live?: Yes      Need for Family Participation in Patient Care: No (Comment) Care giver support system in place?: Yes (comment)   Criminal Activity/Legal Involvement Pertinent to Current Situation/Hospitalization: No - Comment as needed  Activities of Daily Living Home Assistive Devices/Equipment: Other (Comment) ADL Screening (condition at time of admission) [name] cognitive ability adequate to safely complete daily activities?: Yes Is the patient deaf or have difficulty hearing?: No Does the patient have difficulty seeing, even when wearing glasses/contacts?: No Does the patient have difficulty concentrating, remembering, or making decisions?: No Patient able to express need for assistance with ADLs?: Yes Does the patient have difficulty dressing or bathing?: No Independently performs ADLs?: Yes (appropriate for  developmental age) Does the patient have difficulty walking or climbing stairs?: Yes Weakness of Legs: Left Weakness of Arms/Hands: None  Permission Sought/Granted Permission sought to share information with : Case Manager Permission granted to share information with : Yes, Verbal Permission Granted  Share Information with NAME: Case Manager     Permission granted to share info w Relationship: William Ali spouse [phone]     Emotional Assessment Appearance:: Appears stated age Attitude/Demeanor/Rapport: Gracious Affect (typically observed): Accepting Orientation: : Oriented to Self, Oriented to Place, Oriented to  Time, Oriented to Situation Alcohol / Substance Use: Not Applicable Psych Involvement: No (comment)  Admission diagnosis:  Pulmonary embolism ([place]) [I26.99] Patient Active Problem List   Diagnosis Date Noted  . Pulmonary embolism ([place]) 06/13/2019  . DVT (deep venous thrombosis) ([place]) 06/13/2019  . Goals of care, counseling/discussion 05/21/2019  . Metastatic adenocarcinoma to brain ([place]) 05/21/2019  . Adenocarcinoma of lung ([place]) 05/21/2019   PCP:  Patient, No Pcp Per Pharmacy:   [id number] - [place], [place] [address] Phone: [phone] Fax: [phone]  [facility] [place], [address] [place] [address] Phone: [phone] Fax: [phone]     Social Determinants of Health (SDOH) Interventions    Readmission Risk Interventions Readmission Risk Prevention Plan 06/14/2019  Transportation Screening Complete  Medication Review Press photographer) Complete  PCP or Specialist appointment within 3-5 days of discharge Complete  HRI or [place] Complete  SW Recovery Care/Counseling Consult Complete  [place] Not Applicable  Some recent data might be hidden

## 2019-06-14 NOTE — Progress Notes (Signed)
CRITICAL VALUE ALERT  Critical Value:  aPTT > 200  Date & Time Notied:  06/14/2019 1104  Provider Notified: Dr. Antonieta Pert and Royetta Asal, [facility]  Orders Received/Actions taken:

## 2019-06-14 NOTE — Progress Notes (Signed)
[place] for IV heparin Indication: recent DVT, suspicion for PE  Allergies  Allergen Reactions  . Onion Anaphylaxis  . Other     All sea food causes swelling and a rash.    . Peanut Oil Itching  . Peanut-Containing Drug Products Itching    Patient Measurements: Height: 5' 7.01" (170.2 cm) Weight: 93.7 kg (206 lb 9.1 oz) IBW/kg (Calculated) : 66.12 Heparin Dosing Weight: 85.9 kg  Vital Signs: Temp: 98 F (36.7 C) (05/19 1315) Temp Source: Oral (05/19 1315) BP: 145/78 (05/19 1315) Pulse Rate: 84 (05/19 1315)  Labs: Recent Labs    06/13/19 1153 06/13/19 1153 06/13/19 1852 06/14/19 0250 06/14/19 0251 06/14/19 1023 06/14/19 2003  HGB 12.8*  --   --  11.7*  --   --   --   HCT 39.9  --   --  36.7*  --   --   --   PLT 98*  --   --  89*  --   --   --   APTT 31   < >  --   --  186* >200* 81*  LABPROT 16.2*  --   --   --   --   --   --   INR 1.4*  --   --   --   --   --   --   HEPARINUNFRC 0.21*  --   --   --   --   --   --   CREATININE 1.34*  --   --  1.34*  --   --   --   TROPONINIHS 7  --  7  --   --   --   --    < > = values in this interval not displayed.    Estimated Creatinine Clearance: 84.7 mL/min (A) (by C-G formula based on SCr of 1.34 mg/dL (H)).   Medications:  PTA Xarelto starter pack-patient/family report he took only one 15mg  tablet daily on 06/04/19-06/06/19 due to confusion with instructions, then started 15mg  tablet twice daily appropriately 06/07/19-06/10/19 PM. Stopped after 06/10/19 8pm dose due to port placement on 06/12/19.   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 Patient diagnosed with DVT on 06/04/2019, discharged from ED with Xarelto starter pack at that time. Pharmacy consulted for IV heparin dosing for recent DVT and now concern for PE. CTa chest pending. Note that  heparin level can be falsely elevated due to recent Xarelto use.   Significant events:  5/18 PM: CT shows small PE vs tumor invasion of pulmonary artery; heparin stopped d/t small clot burden and risk of massive hemoptysis; TRH will defer to CCM recommendations for [facility]. Per CCM, CT evaluation favors thrombus; will resume heparin w/ conservative dosing; stop for any s/s hemoptysis  Today, 06/14/2019: 2nd shift  APTT= 81 seconds is therapeutic after heparin held x 1 hour and rate decreased to 800 units/hr  No bleeding reported   Goal of Therapy:  APTT 66-84 seconds Heparin level 0.3-0.5 units/ml Monitor platelets by anticoagulation protocol: Yes   Plan:   Continue heparin at 800 units/hr    Daily CBC and heparin level; aPTT as needed while DOAC effect persists  Monitor closely for s/sx of bleeding  Eudelia Bunch, Pharm.D 06/14/2019 8:58 PM

## 2019-06-14 NOTE — Progress Notes (Signed)
[place] for IV heparin Indication: recent DVT, suspicion for PE  Allergies  Allergen Reactions  . Onion Anaphylaxis  . Other     All sea food causes swelling and a rash.    . Peanut Oil Itching  . Peanut-Containing Drug Products Itching    Patient Measurements: Height: 5' 7.01" (170.2 cm) Weight: 93.7 kg (206 lb 9.1 oz) IBW/kg (Calculated) : 66.12 Heparin Dosing Weight: 85.9 kg  Vital Signs: Temp: 98.9 F (37.2 C) (05/19 0738) Temp Source: Oral (05/19 0738) BP: 137/82 (05/19 0532) Pulse Rate: 97 (05/19 0738)  Labs: Recent Labs    06/13/19 1153 06/13/19 1852 06/14/19 0250 06/14/19 0251 06/14/19 1023  HGB 12.8*  --  11.7*  --   --   HCT 39.9  --  36.7*  --   --   PLT 98*  --  89*  --   --   APTT 31  --   --  186* >200*  LABPROT 16.2*  --   --   --   --   INR 1.4*  --   --   --   --   HEPARINUNFRC 0.21*  --   --   --   --   CREATININE 1.34*  --  1.34*  --   --   TROPONINIHS 7 7  --   --   --     Estimated Creatinine Clearance: 84.7 mL/min (A) (by C-G formula based on SCr of 1.34 mg/dL (H)).   Medications:  PTA Xarelto starter pack-patient/family report he took only one 15mg  tablet daily on 06/04/19-06/06/19 due to confusion with instructions, then started 15mg  tablet twice daily appropriately 06/07/19-06/10/19 PM. Stopped after 06/10/19 8pm dose due to port placement on 06/12/19.   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 Patient diagnosed with DVT on 06/04/2019, discharged from ED with Xarelto starter pack at that time. Pharmacy consulted for IV heparin dosing for recent DVT and now concern for PE. CTa chest pending. Note that heparin level can be falsely elevated due to recent Xarelto use.   Significant events:  5/18 PM: CT shows small PE vs tumor invasion of pulmonary artery; heparin  stopped d/t small clot burden and risk of massive hemoptysis; TRH will defer to CCM recommendations for [facility]. Per CCM, CT evaluation favors thrombus; will resume heparin w/ conservative dosing; stop for any s/s hemoptysis  Today, 06/14/2019:  Hgb and Plt both low , stable (likely d/t recent chemo)  No bleeding or IV access issues per RN  APTT > 200, supratherapeutic   Goal of Therapy:  APTT 66-84 seconds Heparin level 0.3-0.5 units/ml Monitor platelets by anticoagulation protocol: Yes   Plan:   Hold heparin infusion for one hour  After one hour, resume heparin at 800 units/hr    Check 6-hr aPTT (heparin level still likely unhelpful at this point)  Daily CBC and heparin level; aPTT as needed while DOAC effect persists  Monitor closely for s/sx of bleeding   Royetta Asal, PharmD, BCPS 06/14/2019 11:34 AM

## 2019-06-14 NOTE — Progress Notes (Signed)
CRITICAL VALUE ALERT  Critical Value:  APTT: 186  Date & Time Notied:  06/14/2019 0336  Provider Notified: M. Sharlet Salina, FNP & Robb Matar, pharmacist    Orders Received/Actions taken: Heparin gtt rate adjusted

## 2019-06-15 ENCOUNTER — Ambulatory Visit: Payer: PRIVATE HEALTH INSURANCE

## 2019-06-15 ENCOUNTER — Inpatient Hospital Stay: Payer: Medicaid Other

## 2019-06-15 ENCOUNTER — Inpatient Hospital Stay: Payer: Medicaid Other | Admitting: Hematology and Oncology

## 2019-06-15 LAB — CBC
HCT: 32.1 % — ABNORMAL LOW (ref 39.0–52.0)
Hemoglobin: 10.1 g/dL — ABNORMAL LOW (ref 13.0–17.0)
MCH: 27.4 pg (ref 26.0–34.0)
MCHC: 31.5 g/dL (ref 30.0–36.0)
MCV: 87 fL (ref 80.0–100.0)
Platelets: 93 10*3/uL — ABNORMAL LOW (ref 150–400)
RBC: 3.69 MIL/uL — ABNORMAL LOW (ref 4.22–5.81)
RDW: 16.1 % — ABNORMAL HIGH (ref 11.5–15.5)
WBC: 4.2 10*3/uL (ref 4.0–10.5)
nRBC: 0 % (ref 0.0–0.2)

## 2019-06-15 LAB — APTT: aPTT: 80 seconds — ABNORMAL HIGH (ref 24–36)

## 2019-06-15 LAB — HEPARIN LEVEL (UNFRACTIONATED): Heparin Unfractionated: 0.45 IU/mL (ref 0.30–0.70)

## 2019-06-15 MED ORDER — RIVAROXABAN 20 MG PO TABS: 20.0000 mg | ORAL_TABLET | Freq: Every day | ORAL | Status: DC

## 2019-06-15 MED ORDER — OXYCODONE HCL 5 MG PO TABS
5.0000 mg | ORAL_TABLET | Freq: Four times a day (QID) | ORAL | Status: DC | PRN
  Administered 2019-06-15 – 2019-06-16 (×2): 5 mg via ORAL
  Filled 2019-06-15 (×2): qty 1

## 2019-06-15 MED ORDER — RIVAROXABAN 15 MG PO TABS
15.0000 mg | ORAL_TABLET | Freq: Two times a day (BID) | ORAL | Status: DC
  Administered 2019-06-15 – 2019-06-16 (×3): 15 mg via ORAL
  Filled 2019-06-15 (×3): qty 1

## 2019-06-15 MED ORDER — HYDROCOD POLST-CPM POLST ER 10-8 MG/5ML PO SUER
5.0000 mL | Freq: Two times a day (BID) | ORAL | Status: DC | PRN
  Administered 2019-06-15: 5 mL via ORAL
  Filled 2019-06-15: qty 5

## 2019-06-15 NOTE — Progress Notes (Addendum)
[facility] PCCM  S: Still has chest pressure, headache; coughed up a small amount of blood tinged mucus  O:  Vitals:   06/14/19 0738 06/14/19 1315 06/14/19 2145 06/15/19 0433  BP:  (!) 145/78 (!) 153/81 (!) 143/76  Pulse: 97 84 91 88  Resp:  20 20 20   Temp: 98.9 F (37.2 C) 98 F (36.7 C) 98.3 F (36.8 C) 98 F (36.7 C)  TempSrc: Oral Oral Oral Oral  SpO2: 100% 100% 100% 100%  Weight:      Height:       RA  General:  Resting in bed, mild tachypnea HENT: NCAT OP clear PULM: CTA B, normal effort CV: RRR, no mgr GI: BS+, soft, nontender MSK: normal bulk and tone Neuro: awake, alert, no distress, MAEW  CBC    Component Value Date/Time   WBC 4.2 06/15/2019 0329   RBC 3.69 (L) 06/15/2019 0329   HGB 10.1 (L) 06/15/2019 0329   HCT 32.1 (L) 06/15/2019 0329   PLT 93 (L) 06/15/2019 0329   MCV 87.0 06/15/2019 0329   MCH 27.4 06/15/2019 0329   MCHC 31.5 06/15/2019 0329   RDW 16.1 (H) 06/15/2019 0329   LYMPHSABS 0.2 (L) 06/13/2019 1153   MONOABS 0.5 06/13/2019 1153   EOSABS 0.1 06/13/2019 1153   BASOSABS 0.0 06/13/2019 1153   CT chest images personally reviewed showing a large cavitary mass in the right upper lobe, segmental PE adjacent, scattered nodules  Impression: PE Lung cancer Hemoptysis Chest pain Headache cough  Plan: Agree with switch to Xarelto, but as his mass is large with recent hemoptysis, he remains high risk for decompensation so it would be reasonable to monitor another 24 hours in hospital Will add tussionex q12h prn cough to help minimize cough/hemoptysis Out of bed  I updated his wife bedside  Roselie Awkward, MD [place] [facility] Pager: [phone] Cell: [phone] If no response, call [phone]

## 2019-06-15 NOTE — Progress Notes (Signed)
ANTICOAGULATION CONSULT NOTE - Follow Up Consult  Pharmacy Consult for Heparin Indication: recent DVT, suspicion for PE  Allergies  Allergen Reactions  . Onion Anaphylaxis  . Other     All sea food causes swelling and a rash.    . Peanut Oil Itching  . Peanut-Containing Drug Products Itching    Patient Measurements: Height: 5' 7.01" (170.2 cm) Weight: 93.7 kg (206 lb 9.1 oz) IBW/kg (Calculated) : 66.12 Heparin Dosing Weight:   Vital Signs: Temp: 98 F (36.7 C) (05/20 0433) Temp Source: Oral (05/20 0433) BP: 143/76 (05/20 0433) Pulse Rate: 88 (05/20 0433)  Labs: Recent Labs    06/13/19 1153 06/13/19 1153 06/13/19 1852 06/14/19 0250 06/14/19 0251 06/14/19 1023 06/14/19 2003 06/15/19 0329  HGB 12.8*   < >  --  11.7*  --   --   --  10.1*  HCT 39.9  --   --  36.7*  --   --   --  32.1*  PLT 98*  --   --  89*  --   --   --  93*  APTT 31  --   --   --    < > >200* 81* 80*  LABPROT 16.2*  --   --   --   --   --   --   --   INR 1.4*  --   --   --   --   --   --   --   HEPARINUNFRC 0.21*  --   --   --   --   --   --  0.45  CREATININE 1.34*  --   --  1.34*  --   --   --   --   TROPONINIHS 7  --  7  --   --   --   --   --    < > = values in this interval not displayed.    Estimated Creatinine Clearance: 84.7 mL/min (A) (by C-G formula based on SCr of 1.34 mg/dL (H)).   Medications:  Infusions:  . sodium chloride 50 mL/hr at 06/14/19 1838  . heparin 800 Units/hr (06/14/19 1229)    Assessment: Patient with PTT and heparin level at reduced goal.  Will only use heparin level going forward.  No heparin issues noted.  Goal of Therapy:  Heparin level 0.3-0.5 units/ml Monitor platelets by anticoagulation protocol: Yes   Plan:  Continue heparin drip at current rate Recheck level with 5/21 AM labs  Tyler Deis, [facility] [place] 06/15/2019,6:10 AM

## 2019-06-15 NOTE — Progress Notes (Signed)
PROGRESS NOTE    LYNDLE PANG  XBM:[id number] DOB: [birth year] DOA: 06/13/2019 PCP: Patient, No Pcp Per   Chef Complaints: Chest pain Brief Narrative: 34 yo unfortunate man with metastatic adenocarcinoma of the lung with mets to the brain and spine treated with chemotherapy/immunotherapy/hormonal therapy and receiving whole brain radiation and radiation to the spine who had recent DVT on 06/04/2019 and was a started on Xarelto comes to the ED with chest pain abdominal pain for over a week.  Patient reportedly had a skilled 2 doses of Xarelto in preparation for his port placement on 06/12/19. In ED on evaluation CT chest with and without contrast showed progressive right hilar adenopathy/soft tissue density which causes severe narrowing of the right upper lobe pulmonary arteries which are attenuated.  A small segmental pulmonary emboli in the right lower lobe are absent adenopathy, may represent direct invasion versus de novo clot, thromboembolic but has a small, no PE on the left however breathing motion artifact and contrast bolus timing limits detail assessment.  Pulmonary was consulted.  Patient was admitted for further management.  Seen by pulmonary patient was placed on heparin drip.  Subjective: Complains of sputum production with blood-tinged mucus.  No other issues. Significant other at the bedside Assessment & Plan:  DVT/PE: Recently diagnosed DVT on 5/9 patient had been on Xarelto on Sunday and Monday for the procedure on Tuesday 5/17 for port.   as outpatient.  CT was reviewed by pulmonary with radiology hard to tell if actually tumor invasion and appearance favors venous thromboembolism so pulmonary recommended heparin with no bolus and close monitoring.  If patient develops hematemesis then will need an IVC filter.  For tolerating well, discussed with pulmonary transition to Xarelto monitor additional 24 hours make sure he has no bleeding.  Complains of mild blood-tinged  sputum.  Metastatic adenocarcinoma the lung with mets to brain and spine.  treated with chemotherapy/immunotherapy/hormonal therapy and receiving whole brain radiation and radiation to the spine.   Reluctant to take Decadron we discussed that he needs to slowly wean it off, switch to IV for now.  Headache in the setting of metastasis of the brain. on Decadron.  Continue PPI as patient complains of epigastric abdominal discomfort since being on steroid.  Continue Dilaudid.  And OxyIR for breakthrough pain  Essential hypertension:  BP control, n metoprolol started here holding home HCTZ due to risk of dehydration.  Number cytopenia platelet low stable. Monitor Recent Labs  Lab 06/13/19 1153 06/14/19 0250 06/15/19 0329  PLT 98* 89* 93*   Lactic acidosis-from dehydration and patient thromboembolism.  At this time blood pressure stable saturating 100% room air.  DVT prophylaxis: xarelto Code Status: full Family Communication: plan of care discussed with patient and his wife at bedside. Status is: Inpatient  Remains inpatient appropriate because:Inpatient level of care appropriate due to severity of illness and To monitor after switching to Xarelto to make sure patient tolerates well and no more bleeding.   Dispo: The patient is from: Home              Anticipated d/c is to: Home              Anticipated d/c date is: 1 day              Patient currently is not medically stable to d/c.        Nutrition: Diet Order            Diet regular Room service appropriate?  Yes; Fluid consistency: Thin  Diet effective now                    Body mass index is 32.35 kg/m. Pressure Ulcer:    Consultants:see note  Procedures:see note Microbiology:see note  Medications: Scheduled Meds: . Chlorhexidine Gluconate Cloth  6 each Topical Daily  . dexamethasone (DECADRON) injection  4 mg Intravenous Q24H  . metoprolol tartrate  12.5 mg Oral BID  . pantoprazole  40 mg Oral Daily   . rivaroxaban  15 mg Oral BID WC   Followed by  . [START ON 06/30/2019] rivaroxaban  20 mg Oral Q supper  . sodium chloride flush  10-40 mL Intracatheter Q12H   Continuous Infusions: . sodium chloride 50 mL/hr at 06/14/19 7062    Antimicrobials: Anti-infectives (From admission, onward)   None       Objective: Vitals: Today's Vitals   06/15/19 0913 06/15/19 0943 06/15/19 1240 06/15/19 1340  BP:      Pulse:      Resp:      Temp:      TempSrc:      SpO2:      Weight:      Height:      PainSc: 10-Worst pain ever Asleep 7  Asleep    Intake/Output Summary (Last 24 hours) at 06/15/2019 1420 Last data filed at 06/15/2019 1337 Gross per 24 hour  Intake 2108.13 ml  Output 575 ml  Net 1533.13 ml   Filed Weights   06/13/19 1012  Weight: 93.7 kg   Weight change:    Intake/Output from previous day: 05/19 0701 - 05/20 0700 In: 2047 [P.O.:540; I.V.:1507] Out: 250 [Urine:250] Intake/Output this shift: Total I/O In: 1676.1 [P.O.:720; I.V.:956.1] Out: 325 [Urine:325]  Examination:  General exam: AAOx3 ,NAD, weak appearing. HEENT:Oral mucosa moist, Ear/Nose WNL grossly,dentition normal. Respiratory system: bilaterally clear,no wheezing or crackles,no use of accessory muscle, non tender. Cardiovascular system: S1 & S2 +, regular, No JVD. Gastrointestinal system: Abdomen soft, NT,ND, BS+. Nervous System:Alert, awake, moving extremities and grossly nonfocal Extremities: No edema, distal peripheral pulses palpable.  Skin: No rashes,no icterus. MSK: Normal muscle bulk,tone, power  Data Reviewed: I have personally reviewed following labs and imaging studies CBC: Recent Labs  Lab 06/13/19 1153 06/14/19 0250 06/15/19 0329  WBC 5.5 4.7 4.2  NEUTROABS 4.5  --   --   HGB 12.8* 11.7* 10.1*  HCT 39.9 36.7* 32.1*  MCV 84.5 85.9 87.0  PLT 98* 89* 93*   Basic Metabolic Panel: Recent Labs  Lab 06/13/19 1153 06/14/19 0250  NA 139 141  K 3.8 4.4  CL 102 106  CO2 24 24   GLUCOSE 110* 112*  BUN 35* 28*  CREATININE 1.34* 1.34*  CALCIUM 9.2 9.1   GFR: Estimated Creatinine Clearance: 84.7 mL/min (A) (by C-G formula based on SCr of 1.34 mg/dL (H)). Liver Function Tests: Recent Labs  Lab 06/13/19 1153 06/14/19 0250  AST 62* 49*  ALT 153* 130*  ALKPHOS 374* 322*  BILITOT 1.0 0.9  PROT 7.0 6.2*  ALBUMIN 3.6 3.1*   Recent Labs  Lab 06/13/19 1153  LIPASE 28   No results for input(s): AMMONIA in the last 168 hours. Coagulation Profile: Recent Labs  Lab 06/13/19 1153  INR 1.4*   Cardiac Enzymes: No results for input(s): CKTOTAL, CKMB, CKMBINDEX, TROPONINI in the last 168 hours. BNP (last 3 results) No results for input(s): PROBNP in the last 8760 hours. HbA1C: No results for input(s): HGBA1C in the  last 72 hours. CBG: No results for input(s): GLUCAP in the last 168 hours. Lipid Profile: No results for input(s): CHOL, HDL, LDLCALC, TRIG, CHOLHDL, LDLDIRECT in the last 72 hours. Thyroid Function Tests: No results for input(s): TSH, T4TOTAL, FREET4, T3FREE, THYROIDAB in the last 72 hours. Anemia Panel: No results for input(s): VITAMINB12, FOLATE, FERRITIN, TIBC, IRON, RETICCTPCT in the last 72 hours. Sepsis Labs: Recent Labs  Lab 06/13/19 1153 06/13/19 1852  LATICACIDVEN 2.0* 2.5*    Recent Results (from the past 240 hour(s))  SARS Coronavirus 2 by RT PCR (hospital order, performed in [facility] hospital lab) Nasopharyngeal Nasopharyngeal Swab     Status: None   Collection Time: 06/13/19  1:42 PM   Specimen: Nasopharyngeal Swab  Result Value Ref Range Status   SARS Coronavirus 2 NEGATIVE NEGATIVE Final    Comment: (NOTE) SARS-CoV-2 target nucleic acids are NOT DETECTED. The SARS-CoV-2 RNA is generally detectable in upper and lower respiratory specimens during the acute phase of infection. The lowest concentration of SARS-CoV-2 viral copies this assay can detect is 250 copies / mL. A negative result does not preclude SARS-CoV-2  infection and should not be used as the sole basis for treatment or other patient management decisions.  A negative result may occur with improper specimen collection / handling, submission of specimen other than nasopharyngeal swab, presence of viral mutation(s) within the areas targeted by this assay, and inadequate number of viral copies (<250 copies / mL). A negative result must be combined with clinical observations, patient history, and epidemiological information. Fact Sheet for Patients:   StrictlyIdeas.no Fact Sheet for Healthcare Providers: BankingDealers.co.za This test is not yet approved or cleared  by the [place] FDA and has been authorized for detection and/or diagnosis of SARS-CoV-2 by FDA under an Emergency Use Authorization (EUA).  This EUA will remain in effect (meaning this test can be used) for the duration of the COVID-19 declaration under Section 564(b)(1) of the Act, 21 U.S.C. section 360bbb-3(b)(1), unless the authorization is terminated or revoked sooner. Performed at [facility], [place] [address], [place], [place] [phone]   Blood culture (routine x 2)     Status: None (Preliminary result)   Collection Time: 06/13/19  6:35 PM   Specimen: BLOOD  Result Value Ref Range Status   Specimen Description   Final    BLOOD PORTA CATH Performed at [facility] [address] [place], [place] [address]    Special Requests   Final    BOTTLES DRAWN AEROBIC AND ANAEROBIC Blood Culture adequate volume Performed at [facility] [address] [place], [place] [address]    Culture   Final    NO GROWTH 2 DAYS Performed at [facility] [address] [place],  [address]    Report Status PENDING  Incomplete      Radiology Studies: No results found.   LOS: 2 days   Antonieta Pert, MD [facility]  06/15/2019, 2:20 PM

## 2019-06-16 ENCOUNTER — Ambulatory Visit: Payer: PRIVATE HEALTH INSURANCE

## 2019-06-16 LAB — BASIC METABOLIC PANEL
Anion gap: 7 (ref 5–15)
BUN: 22 mg/dL — ABNORMAL HIGH (ref 6–20)
CO2: 25 mmol/L (ref 22–32)
Calcium: 8.6 mg/dL — ABNORMAL LOW (ref 8.9–10.3)
Chloride: 105 mmol/L (ref 98–111)
Creatinine, Ser: 1.1 mg/dL (ref 0.61–1.24)
GFR calc Af Amer: >60 mL/min (ref >60)
GFR calc non Af Amer: >60 mL/min (ref >60)
Glucose, Bld: 160 mg/dL — ABNORMAL HIGH (ref 70–99)
Potassium: 4.3 mmol/L (ref 3.5–5.1)
Sodium: 137 mmol/L (ref 135–145)

## 2019-06-16 LAB — CBC
HCT: 32.9 % — ABNORMAL LOW (ref 39.0–52.0)
Hemoglobin: 10.4 g/dL — ABNORMAL LOW (ref 13.0–17.0)
MCH: 27.5 pg (ref 26.0–34.0)
MCHC: 31.6 g/dL (ref 30.0–36.0)
MCV: 87 fL (ref 80.0–100.0)
Platelets: 97 10*3/uL — ABNORMAL LOW (ref 150–400)
RBC: 3.78 MIL/uL — ABNORMAL LOW (ref 4.22–5.81)
RDW: 16.1 % — ABNORMAL HIGH (ref 11.5–15.5)
WBC: 5.8 10*3/uL (ref 4.0–10.5)
nRBC: 0 % (ref 0.0–0.2)

## 2019-06-16 MED ORDER — ALUM & MAG HYDROXIDE-SIMETH 200-200-20 MG/5ML PO SUSP: 30.0000 mL | Freq: Four times a day (QID) | ORAL | 0 refills | Status: DC | PRN

## 2019-06-16 MED ORDER — PANTOPRAZOLE SODIUM 40 MG PO TBEC: 40.0000 mg | DELAYED_RELEASE_TABLET | Freq: Two times a day (BID) | ORAL | 0 refills | Status: DC

## 2019-06-16 MED ORDER — HEPARIN SOD (PORK) LOCK FLUSH 100 UNIT/ML IV SOLN
500.0000 [IU] | INTRAVENOUS | Status: AC | PRN
  Administered 2019-06-16: 500 [IU]
  Filled 2019-06-16: qty 5

## 2019-06-16 MED ORDER — HYDROCOD POLST-CPM POLST ER 10-8 MG/5ML PO SUER: 5.0000 mL | Freq: Two times a day (BID) | ORAL | 0 refills | Status: DC | PRN

## 2019-06-16 MED ORDER — METOPROLOL TARTRATE 25 MG PO TABS: 12.5000 mg | ORAL_TABLET | Freq: Two times a day (BID) | ORAL | 0 refills | Status: DC

## 2019-06-16 NOTE — Discharge Summary (Signed)
Physician Discharge Summary  DAYYAN KRIST RWE:[id number] DOB: 14-Mar-1985 DOA: 06/13/2019  PCP: Patient, No Pcp Per  Admit date: 06/13/2019 Discharge date: 06/16/2019  Admitted From: Home Disposition:  Home  Recommendations for Outpatient Follow-up:  1. Follow up with PCP in 1-2 weeks 2. Please obtain BMP/CBC in one week 3. Please follow up on the following pending results:  Home Health:No  Equipment/Devices: None  Discharge Condition: Stable Code Status: Full Diet recommendation:  Diet Order            Diet - low sodium heart healthy        Diet regular Room service appropriate? 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 Patient reportedly had a skilled 2 doses of Xarelto in preparation for his port placement on 06/12/19. In ED on evaluation CT chest with and without contrast showed progressive right hilar adenopathy/soft tissue density which causes severe narrowing of the right upper lobe pulmonary arteries which are attenuated. A small segmental pulmonary emboli in the right lower lobe are absent adenopathy, may represent direct invasion versus de novo clot, thromboembolic but has a small, no PE on the left however breathing motion artifact and contrast bolus timing limits detail assessment. Pulmonary was consulted. Patient was admitted for further management.  Seen by pulmonary patient was placed on heparin drip  Seen by pulmonary subsequently transitioned to Xarelto.  He tolerated well.  No chest pain.  At this time is medically stable.  Discussed with Dr. Leilani Merl today okay for discharge home.  He  has been having some flecks of blood in this sputum same like yesterday and has not worsened.  Wife and patient informed about seeking medical care ED visit or notifying MD if he has worsening blood in the sputum or any other worsening symptoms of chest pain shortness of breath.  Discharge Diagnoses:  DVT/PE with recently diagnosed DVT on 5/9 and was initiated on Xarelto : he held his Xarelto on Sunday and Monday for the procedure on Tuesday 5/17 for port.   On admission CT was reviewed by pulmonary with radiology hard to tell if actually tumor invasion and appearance favors venous thromboembolism so pulmonary recommended heparin with no bolus and close monitoring.  Patient has been transitioned to oral Xarelto.  I discussed with PCCM today and okay for discharge home with wife.  Metastatic adenocarcinoma the lung with mets to brain and spine.  treated with chemotherapy/immunotherapy/hormonal therapy and receiving whole brain radiation and radiation to the spine.  Reluctant to take Decadron we discussed that he needs to slowly wean it off, switch to IV for now.  Headache in the setting of metastasis of the brain. on Decadron. Continue and wean as per oncology.  Added PPI BID, was on once daily at home/ cont prn Maalox given GI upset.  Patient seems to not like Decadron and he is advised to follow-up with his oncologist regarding weaning off.  Essential hypertension:BP is controlled holding of home HCTZ, continue on metoprolol.  Thrombocytopenia overall stable low.  Follow-up outpatient  Consults:  PCCM  Subjective: No new complaints Discharge  Exam: Vitals:   06/15/19 2022 06/16/19 0507  BP: 133/70 134/77  Pulse: 91 90  Resp: 18 18  Temp: 98.1 F (36.7 C) 97.9 F (36.6 C)  SpO2: 99% 98%   General: Pt is alert, awake, not in acute distress Cardiovascular: RRR, S1/S2 +, no rubs, no gallops Respiratory: CTA bilaterally, no wheezing, no rhonchi Abdominal: Soft, NT, ND, bowel sounds  + Extremities: no edema, no cyanosis  Discharge Instructions  Discharge Instructions    Diet - low sodium heart healthy   Complete by: As directed    Discharge instructions   Complete by: As directed    Please notify your physician or return to ED if you have worsening blood in the sputum.  Please call call MD or return to ER for similar or worsening recurring problem that brought you to hospital or if any fever,nausea/vomiting,abdominal pain, uncontrolled pain, chest pain,  shortness of breath or any other alarming symptoms.  Please follow-up your doctor as instructed in a week time and call the office for appointment.  Please avoid alcohol, smoking, or any other illicit substance and maintain healthy habits including taking your regular medications as prescribed.  You were cared for by a hospitalist during your hospital stay. If you have any questions about your discharge medications or the care you received while you were in the hospital after you are discharged, you can call the unit and ask to speak with the hospitalist on call if the hospitalist that took care of you is not available.  Once you are discharged, your primary care physician will handle any further medical issues. Please note that NO REFILLS for any discharge medications will be authorized once you are discharged, as it is imperative that you return to your primary care physician (or establish a relationship with a primary care physician if you do not have one) for your aftercare needs so that they can reassess your need for medications and monitor your lab values   Increase activity slowly   Complete by: As directed      Allergies as of 06/16/2019      Reactions   Onion Anaphylaxis   Other    All sea food causes swelling and a rash.     Peanut Oil Itching   Peanut-containing Drug Products Itching      Medication List    STOP taking these medications   gabapentin 600 MG tablet Commonly known as: Neurontin    hydrochlorothiazide 25 MG tablet Commonly known as: HYDRODIURIL     TAKE these medications   albuterol 108 (90 Base) MCG/ACT inhaler Commonly known as: VENTOLIN HFA Inhale 2 puffs into the lungs every 6 (six) hours as needed for wheezing or shortness of breath.   alum & mag hydroxide-simeth 200-200-20 MG/5ML suspension Commonly known as: MAALOX/MYLANTA Take 30 mLs by mouth every 6 (six) hours as needed for indigestion or heartburn.   benzonatate 100 MG capsule Commonly known as: TESSALON Take 1 capsule (100 mg total) by mouth every 8 (eight) hours.   cetirizine 10 MG tablet Commonly known as: ZYRTEC Take 1 tablet (10 mg total) by mouth daily.   dexamethasone 4 MG tablet Commonly known as: DECADRON Take 1 tablet (4 mg total) by mouth 2 (two) times daily with a meal. Starting on 5/11, and drop dose to once daily on 5/14 What changed: Another medication with the same name was removed. Continue taking this medication, and follow the directions you see here.   HYDROmorphone 2 MG tablet Commonly  known as: Dilaudid 1 to 2 tablets q 4 hours prn pain. What changed:   how much to take  how to take this  when to take this  reasons to take this  additional instructions   LORazepam 0.5 MG tablet Commonly known as: Ativan 1 tab po q 4-6 hours prn or 1 tab po 30 minutes prior to radiation   metoprolol tartrate 25 MG tablet Commonly known as: LOPRESSOR Take 0.5 tablets (12.5 mg total) by mouth 2 (two) times daily.   ondansetron 8 MG tablet Commonly known as: ZOFRAN Take 1 tablet (8 mg total) by mouth every 8 (eight) hours as needed for nausea or vomiting.   pantoprazole 40 MG tablet Commonly known as: PROTONIX Take 1 tablet (40 mg total) by mouth 2 (two) times daily. What changed: when to take this   Rivaroxaban Stater Pack (15 mg and 20 mg) Commonly known as: XARELTO STARTER PACK Follow package directions: Take one 15mg  tablet by mouth twice a day. On day 22, switch to  one 20mg  tablet once a day. Take with food.   sodium chloride 0.65 % Soln nasal spray Commonly known as: OCEAN Place 1 spray into both nostrils as needed for congestion.      Follow-up [facility] up on 07/19/2019.   Specialty: Internal Medicine Why: 9:20a Contact information: [place] [address] [phone]         Allergies  Allergen Reactions  . Onion Anaphylaxis  . Other     All sea food causes swelling and a rash.    . Peanut Oil Itching  . Peanut-Containing Drug Products Itching    The results of significant diagnostics from this hospitalization (including imaging, microbiology, ancillary and laboratory) are listed below for reference.    Microbiology: Recent Results (from the past 240 hour(s))  SARS Coronavirus 2 by RT PCR (hospital order, performed in [facility] hospital lab) Nasopharyngeal Nasopharyngeal Swab     Status: None   Collection Time: 06/13/19  1:42 PM   Specimen: Nasopharyngeal Swab  Result Value Ref Range Status   SARS Coronavirus 2 NEGATIVE NEGATIVE Final    Comment: (NOTE) SARS-CoV-2 target nucleic acids are NOT DETECTED. The SARS-CoV-2 RNA is generally detectable in upper and lower respiratory specimens during the acute phase of infection. The lowest concentration of SARS-CoV-2 viral copies this assay can detect is 250 copies / mL. A negative result does not preclude SARS-CoV-2 infection and should not be used as the sole basis for treatment or other patient management decisions.  A negative result may occur with improper specimen collection / handling, submission of specimen other than nasopharyngeal swab, presence of viral mutation(s) within the areas targeted by this assay, and inadequate number of viral copies (<250 copies / mL). A negative result must be combined with clinical observations, patient history, and epidemiological information. Fact Sheet for Patients:    StrictlyIdeas.no Fact Sheet for Healthcare Providers: BankingDealers.co.za This test is not yet approved or cleared  by the [place] FDA and has been authorized for detection and/or diagnosis of SARS-CoV-2 by FDA under an Emergency Use Authorization (EUA).  This EUA will remain in effect (meaning this test can be used) for the duration of the COVID-19 declaration under Section 564(b)(1) of the Act, 21 U.S.C. section 360bbb-3(b)(1), unless the authorization is terminated or revoked sooner. Performed at [facility], [place] [address] [place], [place] [address]   Blood culture (  routine x 2)     Status: None (Preliminary result)   Collection Time: 06/13/19  6:35 PM   Specimen: BLOOD  Result Value Ref Range Status   Specimen Description   Final    BLOOD PORTA CATH Performed at [facility] [address] [place], [place] [address]    Special Requests   Final    BOTTLES DRAWN AEROBIC AND ANAEROBIC Blood Culture adequate volume Performed at [facility] [address], [place], [place] [address]    Culture   Final    NO GROWTH 3 DAYS Performed at [facility], [place] [address], [place], [place] [address]    Report Status PENDING  Incomplete    Procedures/Studies: DG Tibia/Fibula Left  Result Date: 06/04/2019 CLINICAL DATA:  Leg pain EXAM: LEFT TIBIA AND FIBULA - 2 VIEW COMPARISON:  None. FINDINGS: No fracture of the tibia or fibula. Knee joint and ankle joint appear normal on two views. The lateral tibial plateau appears irregular however on the comparison femur exam same day the lateral tibial plateau appears normal. IMPRESSION: No fracture or dislocation. No aggressive osseous lesion identified. Electronically Signed   By: Suzy Bouchard M.D.   On: 06/04/2019 09:00   CT Head Wo Contrast  Result Date: 06/04/2019 CLINICAL DATA:  Worsening headaches. Additional history  provided: Recently diagnosed with metastatic lung cancer. EXAM: CT HEAD WITHOUT CONTRAST TECHNIQUE: Contiguous axial images were obtained from the base of the skull through the vertex without intravenous contrast. COMPARISON:  Brain MRI 05/10/2019, head CT 09/13/2012 FINDINGS: Brain: Numerous small enhancing parenchymal lesions were demonstrated on prior MRI 05/10/2019. Some of these are appreciated on today's examination but many of these are occult by CT. Several of these lesions were hemorrhagic on the prior MRI. There is a 3 mm focus of hyperdensity within the left occipital lobe consistent with redemonstrated hemorrhage associated with a lesion at this site (series 2, image 17). No definitively new lesion is identified. No demarcated cortical infarct. No extra-axial fluid collection. No evidence of intracranial mass. No midline shift. Vascular: No hyperdense vessel. Skull: No calvarial fracture. Suspected metastases within the skull base and upper cervical spine were better appreciated on prior MRI. Sinuses/Orbits: No acute orbital abnormality by CT. Please note subtle enhancement along the posterior left globe with suspected on prior MRI. Ethmoid and sphenoid sinus mucosal thickening. Right sphenoid sinus air-fluid level. No significant mastoid effusion IMPRESSION: Numerous small parenchymal lesions (likely metastases) were demonstrated on prior MRI 05/10/2019. Some of these lesions are appreciated on today's examination, but many of these are occult by CT. There is a 3 mm focus of hyperdensity consistent with hemorrhage at site of a known left occipital lobe lesion. However, this is unchanged and hemorrhage was present at this site on prior MRI. No definitively new lesion or interval acute intracranial abnormality is identified. Suspected metastases within the skull base and upper cervical spine were better appreciated on prior MRI. Paranasal sinus mucosal thickening with right sphenoid sinus air-fluid level.  Correlate for acute sinusitis. Electronically Signed   By: Kellie Simmering DO   On: 06/04/2019 08:44   CT Angio Chest PE W and/or Wo Contrast  Result Date: 06/13/2019 CLINICAL DATA:  Shortness of breath. Chest pain. Metastatic lung carcinoma. Active chemotherapy and radiation. EXAM: CT ANGIOGRAPHY CHEST WITH CONTRAST TECHNIQUE: Multidetector CT imaging of the chest was performed using the standard protocol during bolus administration of intravenous contrast. Multiplanar CT image reconstructions and MIPs were obtained to  evaluate the vascular anatomy. CONTRAST:  138mL OMNIPAQUE IOHEXOL 350 MG/ML SOLN COMPARISON:  Chest radiograph earlier this day. Most recent chest CT 05/10/2019 FINDINGS: Cardiovascular: Right hilar adenopathy/soft tissue density causes severe narrowing of the right upper lobe pulmonary arteries which are attenuated. This causes mass effect on the distal main right pulmonary artery. Circumferential soft tissue density involving the right lower lobar pulmonary artery. Filling defects within the segmental branches of the right lower lobe pulmonary arteries are eccentric and may be due to direct invasion or de novo pulmonary embolus, for example series 6, image 145 and 149. No definite pulmonary emboli on the left, however breathing motion artifact and contrast bolus timing limits detailed assessment. Thoracic aorta is normal in caliber without dissection. Right chest port tip in the lower SVC. Heart is normal in size. No pericardial effusion. Mediastinum/Nodes: Progressive right hilar adenopathy/soft tissue density causes severe narrowing of the right upper lobe pulmonary arteries. This is increased from prior exam. Representative right hilar lymph node measures 22 mm, series 5, image 59, previously 19 mm. This is contiguous with right paratracheal soft tissue densities/adenopathy which is difficult to delineate. Right lower paratracheal node has increased currently 13 mm, series 5, image 52,  previously 10 mm. High mediastinal node measures 11 mm, series 5, image 37, unchanged. No left hilar adenopathy. Esophagus slightly patulous without wall thickening. No visualized thyroid nodule. Lungs/Pleura: Spiculated right upper lobe cavitary nodule currently measuring approximately 3 x 2.6 cm with decreased peripheral soft tissue component from prior exam. This is contiguous with soft tissue density that extends to the right hilum. Severe narrowing of the right upper lobe bronchus. Ill-defined paramediastinal density in the right upper lobe is likely post treatment related change. Peripheral subpleural opacity in the right upper lobe abutting, series 7, image 57 is nonspecific, and may represent pulmonary infarct, post treatment related change, or pneumonia. Small left apical nodule series 7, image 39 and 40 are unchanged. Small nodule in the superior segment of the left lower lobe is unchanged, series 7, image 55. 3 mm perifissural nodule in the right middle lobe, series 7, image 64, unchanged. No definite new nodule. No pleural fluid. Upper Abdomen: Known metastatic disease in the liver, not as well visualized on the current exam given phase of contrast. No acute abdominal findings. 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 T3 lesion causing mild pathologic compression fracture, similar to prior. No new spinal compression fractures. Review of the MIP images confirms the above findings. IMPRESSION: 1. Progressive right hilar adenopathy/soft tissue density which causes severe narrowing of the right upper lobe pulmonary arteries which are attenuated. Small segmental pulmonary emboli in the right lower lobe are adjacent to adenopathy, may represent direct invasion versus did no novo clot. Thromboembolic burden is small. No pulmonary embolus on the left, however breathing motion artifact and contrast bolus timing limits detailed assessment. 2. Spiculated right upper lobe cavitary nodule with  decreased peripheral soft tissue component from prior exam and slight decrease in size. 3. Progressive right hilar adenopathy/soft tissue density causes severe narrowing of the right upper lobe bronchus. There may be an element of post treatment related change, however some of the mediastinal nodes have increased in size suspicious for progression in disease. 4. Additional small pulmonary nodules both lungs are unchanged from exam last month. 5. Osseous metastatic disease is grossly unchanged. Known hepatic metastatic disease is not well delineated given phase of contrast These results were called by telephone at the time of interpretation on 06/13/2019  at 1:52 pm to PA [facility] , who verbally acknowledged these results. Electronically Signed   By: Keith Rake M.D.   On: 06/13/2019 13:53   DG Chest Portable 1 View  Result Date: 06/13/2019 CLINICAL DATA:  Shortness of breath and chest pain EXAM: PORTABLE CHEST 1 VIEW COMPARISON:  Jun 02, 2019 FINDINGS: Port-A-Cath tip is in the superior vena cava. No pneumothorax. Lungs are clear. Heart size and pulmonary vascularity are normal. No adenopathy. No bone lesions. IMPRESSION: Port-A-Cath tip in superior vena cava. No pneumothorax. Lungs clear. Cardiac silhouette within normal limits. Electronically Signed   By: Lowella Grip III M.D.   On: 06/13/2019 10:54   DG Chest Portable 1 View  Result Date: 06/02/2019 CLINICAL DATA:  Hervey Ard right-sided chest pain EXAM: PORTABLE CHEST 1 VIEW COMPARISON:  May 06, 2019 FINDINGS: The previously demonstrated right upper lobe pulmonary nodule is better visualized on the patient's prior CT chest and prior x-rays. There is no pneumothorax. No significant pleural effusion. No evidence for an acute osseous abnormality. The heart size is normal. There is some mild thickening of the right paratracheal stripe which may be secondary to underlying mediastinal adenopathy. IMPRESSION: 1. Mild thickening of the right  paratracheal stripe which may be secondary to underlying mediastinal adenopathy. 2. The previously demonstrated right upper lobe pulmonary nodule is better visualized on the patient's prior CT chest and prior x-rays. 3. No acute cardiopulmonary process. Electronically Signed   By: Constance Holster M.D.   On: 06/02/2019 17:55   DG Femur Min 2 Views Left  Result Date: 06/04/2019 CLINICAL DATA:  Lower extremity pain. EXAM: LEFT FEMUR 2 VIEWS COMPARISON:  None. FINDINGS: Frontal and lateral views were obtained. No fracture or dislocation. No abnormal periosteal reaction. No blastic or lytic bone lesions. There is moderate degenerative change in the knee joint. No appreciable knee joint effusion. IMPRESSION: No blastic or lytic bone lesions. No abnormal periosteal reaction. No fracture or dislocation. 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 Electronically Signed   By: Lowella Grip III M.D.   On: 06/04/2019 08:55   IR IMAGING GUIDED PORT INSERTION  Result Date: 06/12/2019 INDICATION: 34 year old male with a history of metastatic right upper lobe lung cancer. He presents for port catheter placement. EXAM: IMPLANTED PORT A CATH PLACEMENT WITH ULTRASOUND AND FLUOROSCOPIC GUIDANCE MEDICATIONS: Ancef 2 g; The antibiotic was administered within an appropriate time interval prior to skin puncture. ANESTHESIA/SEDATION: Versed 4 mg IV; Fentanyl 100 mcg IV; Moderate Sedation Time:  19 minutes The patient was continuously monitored during the procedure by the interventional radiology nurse under my direct supervision. FLUOROSCOPY TIME:  0 minutes, 30 seconds (7 mGy) COMPLICATIONS: None immediate. PROCEDURE: The right neck and chest was prepped with chlorhexidine, and draped in the usual sterile fashion using maximum barrier technique (cap and mask, sterile gown, sterile gloves, large sterile sheet, hand hygiene and cutaneous antiseptic). Local anesthesia was attained by infiltration with 1% lidocaine with  epinephrine. Ultrasound demonstrated patency of the right internal jugular vein, and this was documented with an image. Under real-time ultrasound guidance, this vein was accessed with a 21 gauge micropuncture needle and image documentation was performed. A small dermatotomy was made at the access site with an 11 scalpel. A 0.018" wire was advanced into the SVC and the access needle exchanged for a 70F micropuncture vascular sheath. The 0.018" wire was then removed and a 0.035" wire advanced into the IVC. An appropriate location for the subcutaneous reservoir was selected below the clavicle  and an incision was made through the skin and underlying soft tissues. The subcutaneous tissues were then dissected using a combination of blunt and sharp surgical technique and a pocket was formed. A single lumen power injectable portacatheter was then tunneled through the subcutaneous tissues from the pocket to the dermatotomy and the port reservoir placed within the subcutaneous pocket. The venous access site was then serially dilated and a peel away vascular sheath placed over the wire. The wire was removed and the port catheter advanced into position under fluoroscopic guidance. The catheter tip is positioned in the superior cavoatrial junction. This was documented with a spot image. The portacatheter was then tested and found to flush and aspirate well. The port was flushed with saline followed by 100 units/mL heparinized saline. The pocket was then closed in two layers using first subdermal inverted interrupted absorbable sutures followed by a running subcuticular suture. The epidermis was then sealed with Dermabond. The dermatotomy at the venous access site was also closed with Dermabond. IMPRESSION: Successful placement of a right IJ approach Power Port with ultrasound and fluoroscopic guidance. The catheter is ready for use. Electronically Signed   By: Jacqulynn Cadet M.D.   On: 06/12/2019 16:26   VAS [place] LOWER  EXTREMITY VENOUS (DVT) (ONLY [facility] & [facility])  Result Date: 06/04/2019  Lower Venous DVTStudy Indications: Swelling.  Risk Factors: None identified. Limitations: Poor ultrasound/tissue interface. Comparison Study: No prior studies. Performing Technologist: Oliver Hum RVT  Examination Guidelines: A complete evaluation includes B-mode imaging, spectral Doppler, color Doppler, and power Doppler as needed of all accessible portions of each vessel. Bilateral testing is considered an integral part of a complete examination. Limited examinations for reoccurring indications may be performed as noted. The reflux portion of the exam is performed with the patient in reverse Trendelenburg.  +-----+---------------+---------+-----------+----------+--------------+ RIGHTCompressibilityPhasicitySpontaneityPropertiesThrombus Aging +-----+---------------+---------+-----------+----------+--------------+ CFV  Full           Yes      Yes                                 +-----+---------------+---------+-----------+----------+--------------+   +---------+---------------+---------+-----------+----------+--------------+ LEFT     CompressibilityPhasicitySpontaneityPropertiesThrombus Aging +---------+---------------+---------+-----------+----------+--------------+ CFV      Full           Yes      Yes                                 +---------+---------------+---------+-----------+----------+--------------+ SFJ      Full                                                        +---------+---------------+---------+-----------+----------+--------------+ FV Prox  Full                                                        +---------+---------------+---------+-----------+----------+--------------+ FV Mid   Full                                                        +---------+---------------+---------+-----------+----------+--------------+  FV DistalFull                                                         +---------+---------------+---------+-----------+----------+--------------+ PFV      Full                                                        +---------+---------------+---------+-----------+----------+--------------+ POP      None           No       No                   Acute          +---------+---------------+---------+-----------+----------+--------------+ PTV      None                                         Acute          +---------+---------------+---------+-----------+----------+--------------+ PERO     None                                         Acute          +---------+---------------+---------+-----------+----------+--------------+ Gastroc  None                                         Acute          +---------+---------------+---------+-----------+----------+--------------+     Summary: RIGHT: - No evidence of common femoral vein obstruction.  LEFT: - Findings consistent with acute deep vein thrombosis involving the left popliteal vein, left posterior tibial veins, left peroneal veins, and left gastrocnemius veins. - No cystic structure found in the popliteal fossa.  *See table(s) above for measurements and observations. Electronically signed by Deitra Mayo MD on 06/04/2019 at 12:00:49 PM.    Final     Labs: BNP (last 3 results) No results for input(s): BNP in the last 8760 hours. Basic Metabolic Panel: Recent Labs  Lab 06/13/19 1153 06/14/19 0250 06/16/19 0355  NA 139 141 137  K 3.8 4.4 4.3  CL 102 106 105  CO2 24 24 25   GLUCOSE 110* 112* 160*  BUN 35* 28* 22*  CREATININE 1.34* 1.34* 1.10  CALCIUM 9.2 9.1 8.6*   Liver Function Tests: Recent Labs  Lab 06/13/19 1153 06/14/19 0250  AST 62* 49*  ALT 153* 130*  ALKPHOS 374* 322*  BILITOT 1.0 0.9  PROT 7.0 6.2*  ALBUMIN 3.6 3.1*   Recent Labs  Lab 06/13/19 1153  LIPASE 28   No results for input(s): AMMONIA in the last 168 hours. CBC: Recent Labs  Lab 06/13/19 1153  06/14/19 0250 06/15/19 0329 06/16/19 0355  WBC 5.5 4.7 4.2 5.8  NEUTROABS 4.5  --   --   --   HGB 12.8* 11.7* 10.1* 10.4*  HCT 39.9 36.7* 32.1* 32.9*  MCV 84.5 85.9 87.0 87.0  PLT 98* 89* 93* 97*   Cardiac Enzymes: No results for input(s): CKTOTAL, CKMB, CKMBINDEX, TROPONINI in the last 168 hours. BNP: Invalid input(s): POCBNP CBG: No results for input(s): GLUCAP in the last 168 hours. D-Dimer No results for input(s): DDIMER in the last 72 hours. Hgb A1c No results for input(s): HGBA1C in the last 72 hours. Lipid Profile No results for input(s): CHOL, HDL, LDLCALC, TRIG, CHOLHDL, LDLDIRECT in the last 72 hours. Thyroid function studies No results for input(s): TSH, T4TOTAL, T3FREE, THYROIDAB in the last 72 hours.  Invalid input(s): FREET3 Anemia work up No results for input(s): VITAMINB12, FOLATE, FERRITIN, TIBC, IRON, RETICCTPCT in the last 72 hours. Urinalysis    Component Value Date/Time   COLORURINE YELLOW 06/02/2019 1838   APPEARANCEUR CLEAR 06/02/2019 1838   LABSPEC 1.016 06/02/2019 1838   PHURINE 5.0 06/02/2019 1838   GLUCOSEU NEGATIVE 06/02/2019 1838   HGBUR LARGE (A) 06/02/2019 1838   BILIRUBINUR NEGATIVE 06/02/2019 1838   KETONESUR NEGATIVE 06/02/2019 1838   PROTEINUR NEGATIVE 06/02/2019 1838   UROBILINOGEN 1.0 05/02/2014 1253   NITRITE NEGATIVE 06/02/2019 1838   LEUKOCYTESUR NEGATIVE 06/02/2019 1838   Sepsis Labs Invalid input(s): PROCALCITONIN,  WBC,  LACTICIDVEN Microbiology Recent Results (from the past 240 hour(s))  SARS Coronavirus 2 by RT PCR (hospital order, performed in [facility] lab) Nasopharyngeal Nasopharyngeal Swab     Status: None   Collection Time: 06/13/19  1:42 PM   Specimen: Nasopharyngeal Swab  Result Value Ref Range Status   SARS Coronavirus 2 NEGATIVE NEGATIVE Final    Comment: (NOTE) SARS-CoV-2 target nucleic acids are NOT DETECTED. The SARS-CoV-2 RNA is generally detectable in upper and lower respiratory specimens  during the acute phase of infection. The lowest concentration of SARS-CoV-2 viral copies this assay can detect is 250 copies / mL. A negative result does not preclude SARS-CoV-2 infection and should not be used as the sole basis for treatment or other patient management decisions.  A negative result may occur with improper specimen collection / handling, submission of specimen other than nasopharyngeal swab, presence of viral mutation(s) within the areas targeted by this assay, and inadequate number of viral copies (<250 copies / mL). A negative result must be combined with clinical observations, patient history, and epidemiological information. Fact Sheet for Patients:   StrictlyIdeas.no Fact Sheet for Healthcare Providers: BankingDealers.co.za This test is not yet approved or cleared  by the [place] FDA and has been authorized for detection and/or diagnosis of SARS-CoV-2 by FDA under an Emergency Use Authorization (EUA).  This EUA will remain in effect (meaning this test can be used) for the duration of the COVID-19 declaration under Section 564(b)(1) of the Act, 21 U.S.C. section 360bbb-3(b)(1), unless the authorization is terminated or revoked sooner. Performed at [facility], [place] [address], [place], [place] [address]   Blood culture (routine x 2)     Status: None (Preliminary result)   Collection Time: 06/13/19  6:35 PM   Specimen: BLOOD  Result Value Ref Range Status   Specimen Description   Final    BLOOD PORTA CATH Performed at [facility] [address], [place], [place] [address]    Special Requests   Final    BOTTLES DRAWN AEROBIC AND ANAEROBIC Blood Culture adequate volume Performed at [facility] [address] [place], [place] [address]    Culture   Final    NO GROWTH 3 DAYS Performed at [facility], [place]  [address] [place], [place] [address]     Report Status PENDING  Incomplete     Time coordinating discharge: 25  minutes  SIGNED: Antonieta Pert, MD  [facility] 06/16/2019, 11:21 AM  If 7PM-7AM, please contact night-coverage [email or web address]

## 2019-06-16 NOTE — Progress Notes (Signed)
Patient and his wife given discharge, follow up, and medication instructions, verbalized understanding, IV and telemetry monitor removed, IV team RN to deaccess port, personal belongings with patient, family to transport home

## 2019-06-16 NOTE — Progress Notes (Signed)
Went to room to speak with patient and instructed on previous conversation done earlier with him and his wife RE: plan for discharge home after his radiation at 83, in which patient said he would let me know if he was going to go for radiation, and that I could use his PIV for administering his IV meds once the port was deaccessed.patient then stated he would not be going for radiation and  he would like to eat prior to getting his scheduled IV meds and then go home

## 2019-06-16 NOTE — Plan of Care (Signed)

## 2019-06-16 NOTE — Progress Notes (Signed)
VAST consult to flush and de-access port for discharge. Upon entering pt room, pt verbalized he was not aware he was being discharged and stated, "i'm about to get pissed off". VAST RN advised that unit RN will be in to talk to him and answer any questions before VAST RN flushes and de-accesses port. Unit RN notified of situation.

## 2019-06-17 ENCOUNTER — Inpatient Hospital Stay (HOSPITAL_COMMUNITY): Payer: PRIVATE HEALTH INSURANCE

## 2019-06-17 ENCOUNTER — Emergency Department (HOSPITAL_COMMUNITY): Payer: PRIVATE HEALTH INSURANCE

## 2019-06-17 ENCOUNTER — Encounter: Payer: Self-pay | Admitting: Radiation Oncology

## 2019-06-17 ENCOUNTER — Inpatient Hospital Stay (HOSPITAL_COMMUNITY)
Admission: EM | Admit: 2019-06-17 | Discharge: 2019-06-28 | DRG: 055 | Disposition: A | Discharge status: Home / Self Care | Payer: PRIVATE HEALTH INSURANCE | Attending: Family Medicine | Admitting: Family Medicine

## 2019-06-17 ENCOUNTER — Other Ambulatory Visit: Payer: Self-pay

## 2019-06-17 DIAGNOSIS — Z8249 Family history of ischemic heart disease and other diseases of the circulatory system: Secondary | ICD-10-CM

## 2019-06-17 DIAGNOSIS — E669 Obesity, unspecified: Secondary | ICD-10-CM | POA: Diagnosis present

## 2019-06-17 DIAGNOSIS — R05 Cough: Secondary | ICD-10-CM | POA: Diagnosis not present

## 2019-06-17 DIAGNOSIS — G9520 Unspecified cord compression: Secondary | ICD-10-CM | POA: Diagnosis present

## 2019-06-17 DIAGNOSIS — Z91018 Allergy to other foods: Secondary | ICD-10-CM

## 2019-06-17 DIAGNOSIS — G893 Neoplasm related pain (acute) (chronic): Secondary | ICD-10-CM | POA: Diagnosis present

## 2019-06-17 DIAGNOSIS — Z6834 Body mass index (BMI) 34.0-34.9, adult: Secondary | ICD-10-CM

## 2019-06-17 DIAGNOSIS — C3490 Malignant neoplasm of unspecified part of unspecified bronchus or lung: Secondary | ICD-10-CM | POA: Diagnosis present

## 2019-06-17 DIAGNOSIS — R110 Nausea: Secondary | ICD-10-CM | POA: Diagnosis not present

## 2019-06-17 DIAGNOSIS — R5383 Other fatigue: Secondary | ICD-10-CM | POA: Diagnosis not present

## 2019-06-17 DIAGNOSIS — Z91013 Allergy to seafood: Secondary | ICD-10-CM

## 2019-06-17 DIAGNOSIS — D6959 Other secondary thrombocytopenia: Secondary | ICD-10-CM | POA: Diagnosis present

## 2019-06-17 DIAGNOSIS — C7951 Secondary malignant neoplasm of bone: Secondary | ICD-10-CM | POA: Diagnosis present

## 2019-06-17 DIAGNOSIS — C7949 Secondary malignant neoplasm of other parts of nervous system: Secondary | ICD-10-CM | POA: Diagnosis present

## 2019-06-17 DIAGNOSIS — I2699 Other pulmonary embolism without acute cor pulmonale: Secondary | ICD-10-CM

## 2019-06-17 DIAGNOSIS — M549 Dorsalgia, unspecified: Secondary | ICD-10-CM | POA: Diagnosis present

## 2019-06-17 DIAGNOSIS — R1084 Generalized abdominal pain: Secondary | ICD-10-CM

## 2019-06-17 DIAGNOSIS — Z20822 Contact with and (suspected) exposure to covid-19: Secondary | ICD-10-CM | POA: Diagnosis present

## 2019-06-17 DIAGNOSIS — C787 Secondary malignant neoplasm of liver and intrahepatic bile duct: Secondary | ICD-10-CM | POA: Diagnosis present

## 2019-06-17 DIAGNOSIS — M5441 Lumbago with sciatica, right side: Secondary | ICD-10-CM | POA: Diagnosis present

## 2019-06-17 DIAGNOSIS — C3411 Malignant neoplasm of upper lobe, right bronchus or lung: Secondary | ICD-10-CM | POA: Diagnosis present

## 2019-06-17 DIAGNOSIS — B370 Candidal stomatitis: Secondary | ICD-10-CM | POA: Diagnosis not present

## 2019-06-17 DIAGNOSIS — D630 Anemia in neoplastic disease: Secondary | ICD-10-CM | POA: Diagnosis present

## 2019-06-17 DIAGNOSIS — H5462 Unqualified visual loss, left eye, normal vision right eye: Secondary | ICD-10-CM | POA: Diagnosis present

## 2019-06-17 DIAGNOSIS — K920 Hematemesis: Secondary | ICD-10-CM | POA: Diagnosis not present

## 2019-06-17 DIAGNOSIS — Z79899 Other long term (current) drug therapy: Secondary | ICD-10-CM

## 2019-06-17 DIAGNOSIS — M8458XA Pathological fracture in neoplastic disease, other specified site, initial encounter for fracture: Secondary | ICD-10-CM | POA: Diagnosis present

## 2019-06-17 DIAGNOSIS — I2782 Chronic pulmonary embolism: Secondary | ICD-10-CM | POA: Diagnosis present

## 2019-06-17 DIAGNOSIS — Z86718 Personal history of other venous thrombosis and embolism: Secondary | ICD-10-CM

## 2019-06-17 DIAGNOSIS — K5900 Constipation, unspecified: Secondary | ICD-10-CM | POA: Diagnosis present

## 2019-06-17 DIAGNOSIS — C7931 Secondary malignant neoplasm of brain: Secondary | ICD-10-CM | POA: Diagnosis present

## 2019-06-17 DIAGNOSIS — C3491 Malignant neoplasm of unspecified part of right bronchus or lung: Secondary | ICD-10-CM

## 2019-06-17 DIAGNOSIS — C801 Malignant (primary) neoplasm, unspecified: Secondary | ICD-10-CM

## 2019-06-17 DIAGNOSIS — J90 Pleural effusion, not elsewhere classified: Secondary | ICD-10-CM | POA: Diagnosis present

## 2019-06-17 DIAGNOSIS — T451X5A Adverse effect of antineoplastic and immunosuppressive drugs, initial encounter: Secondary | ICD-10-CM | POA: Diagnosis not present

## 2019-06-17 DIAGNOSIS — Z923 Personal history of irradiation: Secondary | ICD-10-CM

## 2019-06-17 DIAGNOSIS — C799 Secondary malignant neoplasm of unspecified site: Secondary | ICD-10-CM

## 2019-06-17 DIAGNOSIS — R197 Diarrhea, unspecified: Secondary | ICD-10-CM | POA: Diagnosis not present

## 2019-06-17 DIAGNOSIS — I10 Essential (primary) hypertension: Secondary | ICD-10-CM | POA: Diagnosis present

## 2019-06-17 DIAGNOSIS — T402X5A Adverse effect of other opioids, initial encounter: Secondary | ICD-10-CM | POA: Diagnosis not present

## 2019-06-17 DIAGNOSIS — Z7901 Long term (current) use of anticoagulants: Secondary | ICD-10-CM

## 2019-06-17 DIAGNOSIS — Z91010 Allergy to peanuts: Secondary | ICD-10-CM

## 2019-06-17 DIAGNOSIS — Z7189 Other specified counseling: Secondary | ICD-10-CM

## 2019-06-17 DIAGNOSIS — R079 Chest pain, unspecified: Secondary | ICD-10-CM

## 2019-06-17 DIAGNOSIS — Z515 Encounter for palliative care: Secondary | ICD-10-CM

## 2019-06-17 LAB — CBC
HCT: 34.6 % — ABNORMAL LOW (ref 39.0–52.0)
Hemoglobin: 11 g/dL — ABNORMAL LOW (ref 13.0–17.0)
MCH: 27.6 pg (ref 26.0–34.0)
MCHC: 31.8 g/dL (ref 30.0–36.0)
MCV: 86.9 fL (ref 80.0–100.0)
Platelets: 113 10*3/uL — ABNORMAL LOW (ref 150–400)
RBC: 3.98 MIL/uL — ABNORMAL LOW (ref 4.22–5.81)
RDW: 16.7 % — ABNORMAL HIGH (ref 11.5–15.5)
WBC: 6.6 10*3/uL (ref 4.0–10.5)
nRBC: 0.3 % — ABNORMAL HIGH (ref 0.0–0.2)

## 2019-06-17 LAB — BASIC METABOLIC PANEL
Anion gap: 12 (ref 5–15)
BUN: 22 mg/dL — ABNORMAL HIGH (ref 6–20)
CO2: 25 mmol/L (ref 22–32)
Calcium: 8.7 mg/dL — ABNORMAL LOW (ref 8.9–10.3)
Chloride: 102 mmol/L (ref 98–111)
Creatinine, Ser: 1.22 mg/dL (ref 0.61–1.24)
GFR calc Af Amer: >60 mL/min (ref >60)
GFR calc non Af Amer: >60 mL/min (ref >60)
Glucose, Bld: 138 mg/dL — ABNORMAL HIGH (ref 70–99)
Potassium: 3.6 mmol/L (ref 3.5–5.1)
Sodium: 139 mmol/L (ref 135–145)

## 2019-06-17 LAB — TROPONIN I (HIGH SENSITIVITY)
Troponin I (High Sensitivity): 5 ng/L (ref <18)
Troponin I (High Sensitivity): 5 ng/L (ref <18)

## 2019-06-17 LAB — SARS CORONAVIRUS 2 BY RT PCR (HOSPITAL ORDER, PERFORMED IN ~~LOC~~ HOSPITAL LAB): SARS Coronavirus 2: NEGATIVE

## 2019-06-17 LAB — HEPARIN LEVEL (UNFRACTIONATED): Heparin Unfractionated: 1.82 IU/mL — ABNORMAL HIGH (ref 0.30–0.70)

## 2019-06-17 LAB — APTT: aPTT: 32 seconds (ref 24–36)

## 2019-06-17 IMAGING — CT CT CERVICAL SPINE W/O CM
3 of 4 series · 12 of 33 positions shown, 14 images · non-contrast
Comparison: [DATE] CT scan of the brain. Brain MRI [DATE].

CLINICAL DATA: Known brain metastases. Worsening headache. Patient
recently started anticoagulation.

EXAM:
CT HEAD WITHOUT CONTRAST
CT CERVICAL SPINE WITHOUT CONTRAST
TECHNIQUE: Multidetector CT imaging of the head and cervical spine was
performed following the standard protocol without intravenous
contrast. Multiplanar CT image reconstructions of the cervical spine
were also generated.

[Series 5: orthogonal bone · axial · 0.36mm/px · z∈[-304,-183]mm · 4 of 100 slices shown, 5 images]
[im 17/100  soft-tissue]
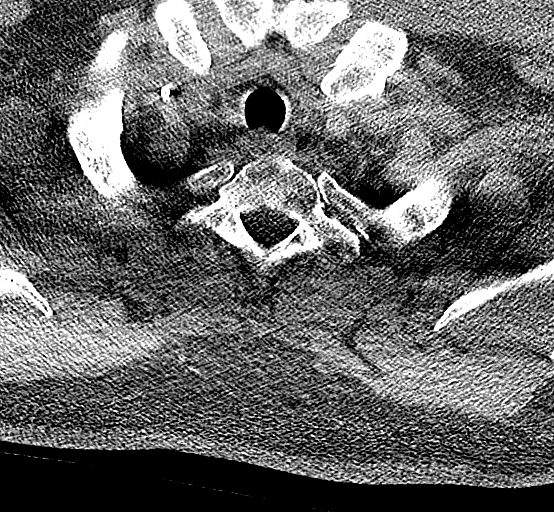
[im 17/100  bone]
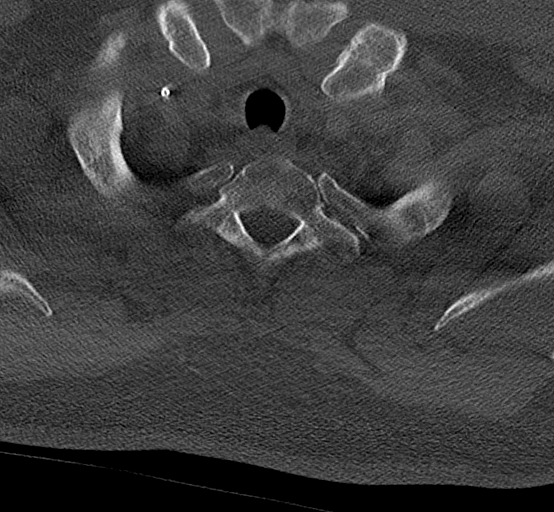
[im 34/100  bone]
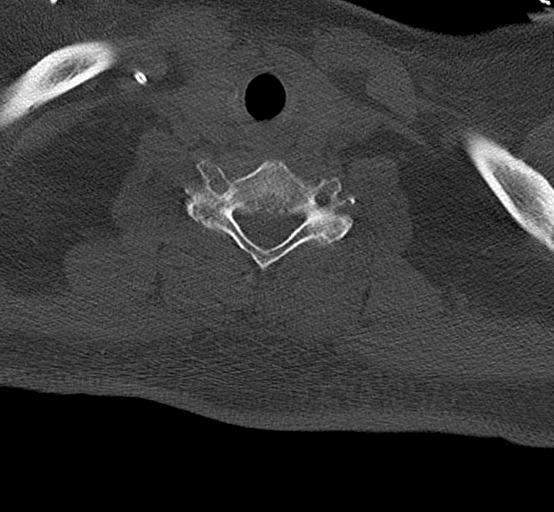
[im 67/100  bone]
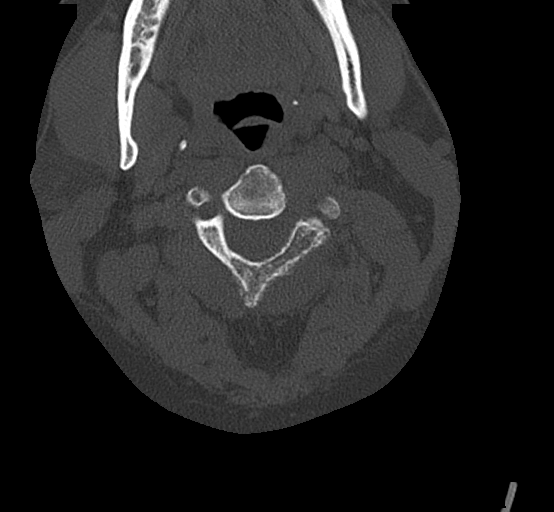
[im 83/100  bone]
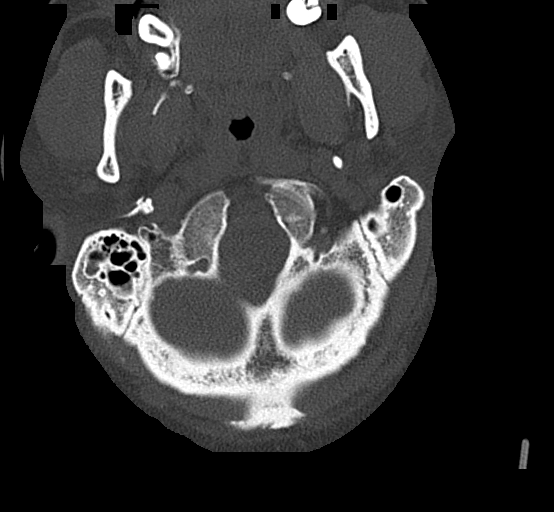

[Series 6: coronal bone · coronal · 0.27mm/px · 3 of 50 slices shown]
[im 10/50  bone]
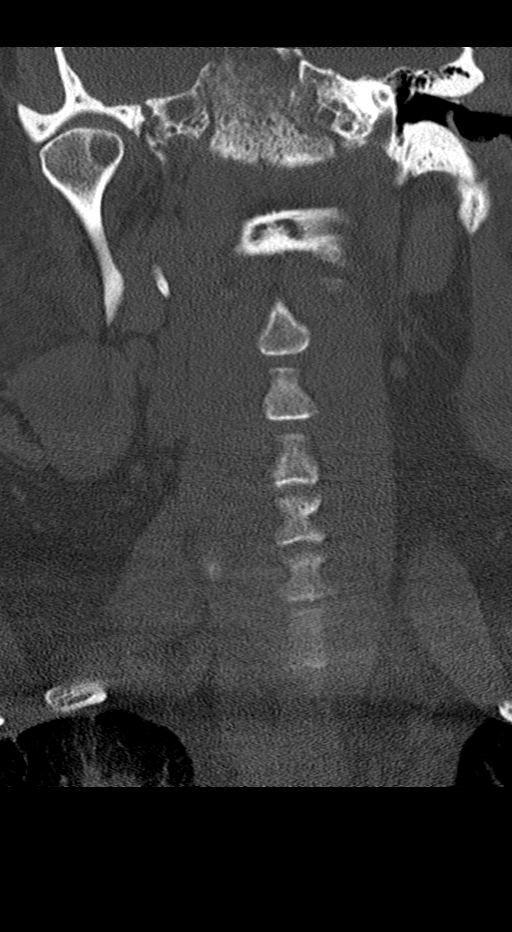
[im 20/50  bone]
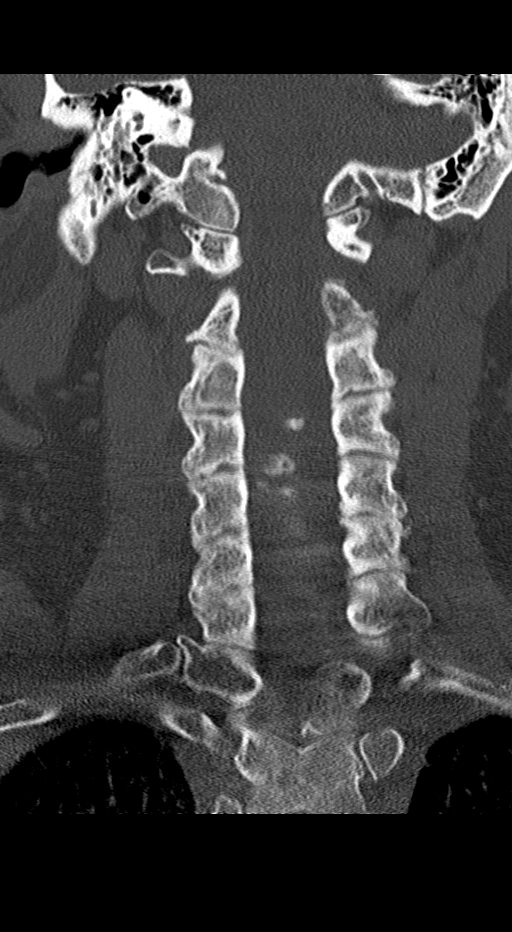
[im 30/50  bone]
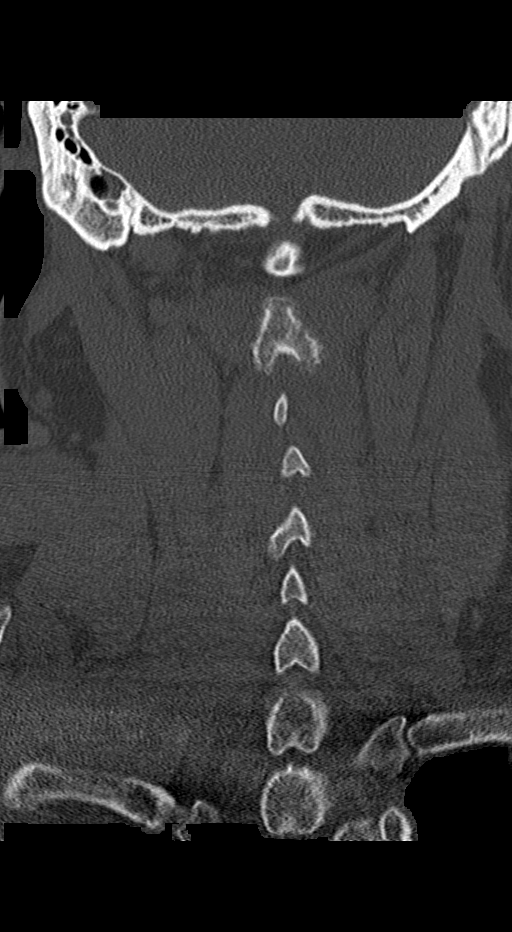

[Series 7: sagittal bone · sagittal · 0.38mm/px · 5 of 56 slices shown, 6 images]
[im 19/56  bone]
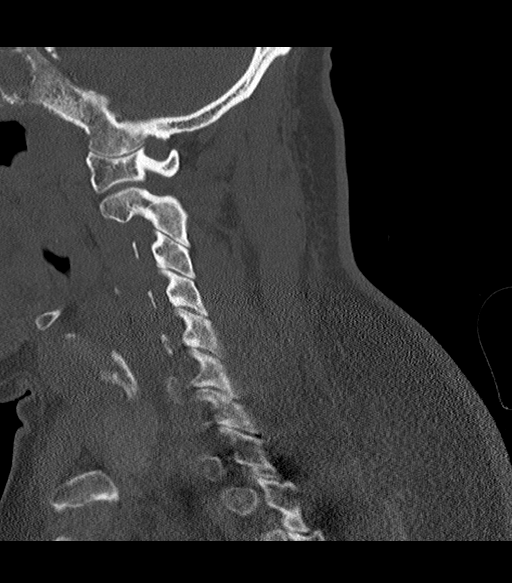
[im 23/56  bone]
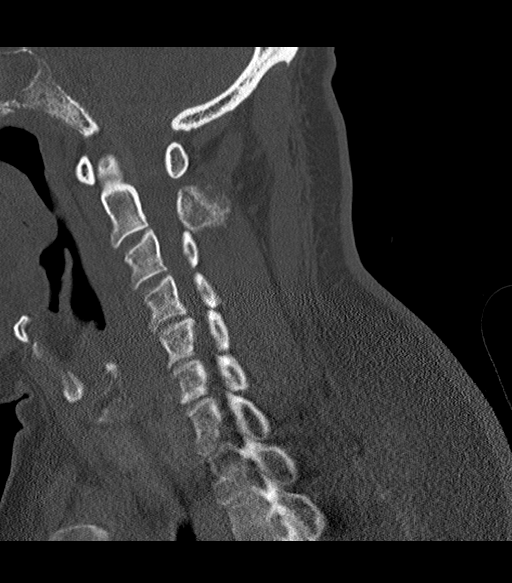
[im 28/56  soft-tissue]
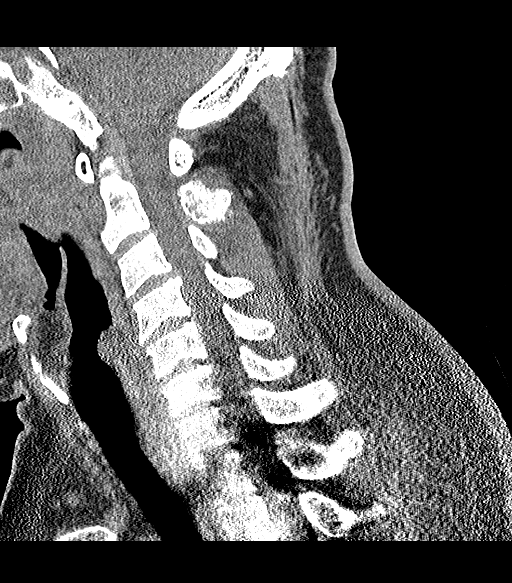
[im 28/56  bone]
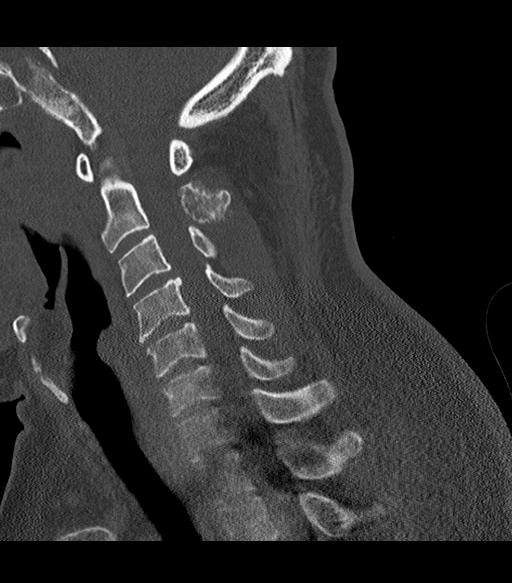
[im 33/56  bone]
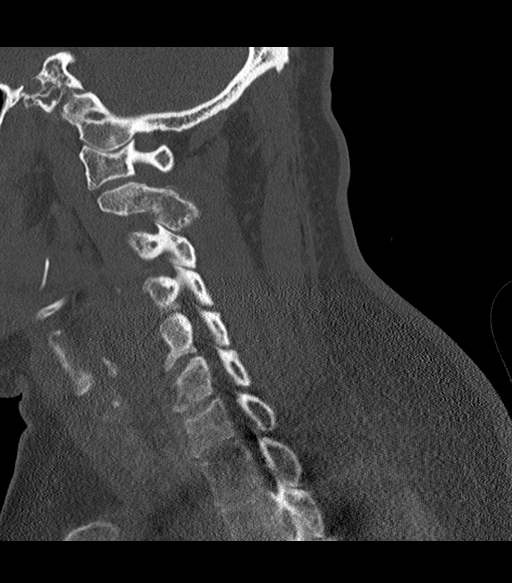
[im 37/56  bone]
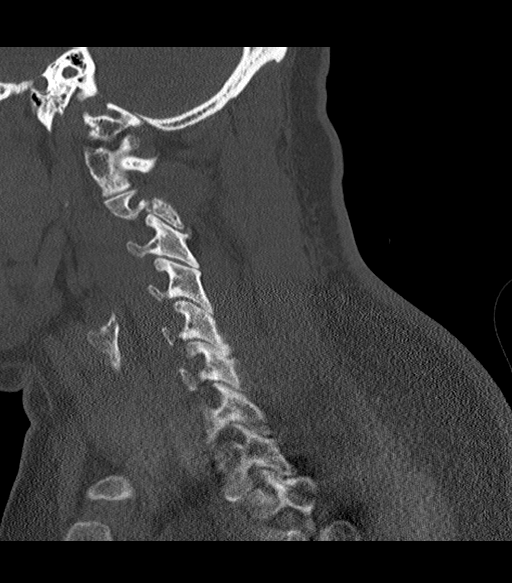

[12 of 33 positions shown; findings below may reference images not displayed]

FINDINGS: CT HEAD FINDINGS

Brain: No subdural, epidural, or subarachnoid hemorrhage. Most of
the patient's known brain metastases are not visualized on this
unenhanced CT scan. There is suggestion of a few metastases, very
subtle. No acute cortical ischemia or infarct. Cerebellum,
brainstem, and basal cisterns are unremarkable. No midline shift or
mass effect identified.

Vascular: No hyperdense vessel or unexpected calcification.

Skull: Suspected calvarial metastases seen on MRI are not well
appreciated on this study.

Sinuses/Orbits: Minimal fluid in the posterior right mastoid air
cells without bony erosion. There is opacification of the sphenoid
sinuses which is worsened in the interval.

Other: The patient is suspected left retinal metastasis likely
presents with slight thickening on today's CT scan, very subtle.

CT CERVICAL SPINE FINDINGS

Alignment: Normal.

Skull base and vertebrae: No fractures identified. Relative lucency
in the left lamina and pedicle at C2, extending into the spinous
process with periosteal reaction. No other discrete bony lesions in
the cervical spine. A lucency in the right mandibular head is
well-circumscribed.

Soft tissues and spinal canal: No prevertebral fluid or swelling. No
visible canal hematoma.

Disc levels:  Mild multilevel degenerative disc disease.

Upper chest: Only the superior most aspect of the right apical mass
is seen on this CT. Several small nodules in the left apex are
better assessed on today's CT scan of the chest.

Other: No other abnormalities.
IMPRESSION: 1. Most of the patient's known brain metastases are not seen on this
noncontrast CT of the brain. There is suggestion of some of the
metastases, very subtle. No midline shift. Basal cisterns are
patent.
2. The patient's known left retinal metastasis is extremely subtle
on this study.
3. Suspected calvarial metastases on the previous MRI are not well
seen on this study.
4. No fracture or malalignment in the cervical spine. Lucency in the
left C2 lamina and pedicle extending into the spinous process with
periosteal reaction suggest metastatic disease in this region. No
other definitive bony metastatic disease in the cervical spine.
5. There is a rounded lucency in the right mandibular head which is
nonspecific.
6. Nodularity in the lung apices. See the CT scan of the chest for
better evaluation of this region.

## 2019-06-17 IMAGING — MR MR THORACIC SPINE W/O CM
6 of 7 series · 38 of 48 positions shown · non-contrast
Comparison: Cervical MRI today reported separately. Thoracic spine
CT, CT Chest, Abdomen, and Pelvis yesterday.

CLINICAL DATA: 33-year-old male with widely metastatic lung cancer.
Back pain.

EXAM:
MRI THORACIC SPINE WITHOUT CONTRAST
TECHNIQUE: Multiplanar, multisequence MR imaging of the thoracic spine was
performed. No intravenous contrast was administered.

[Series 30: T1 · sagittal · 4.0mm · 1.72mm/px · 4 of 17 slices shown (1 of 2)]
[im 1/17]
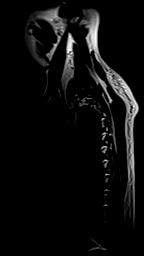
[im 6/17]
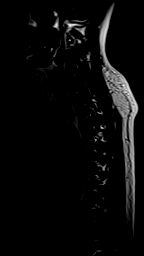
[im 11/17]
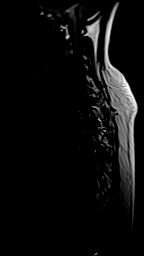
[im 17/17]
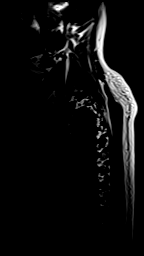

[Series 31: STIR · sagittal · 3.0mm · 1.00mm/px · 4 of 17 slices shown]
[im 1/17]
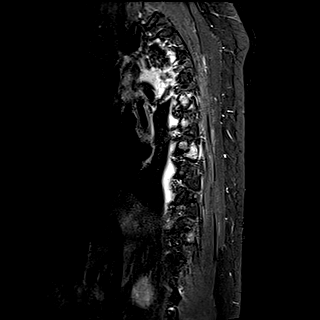
[im 6/17]
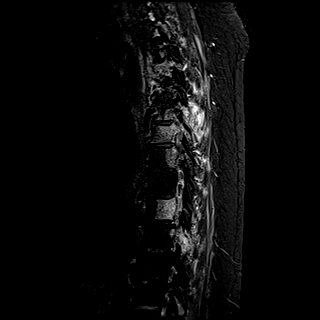
[im 11/17]
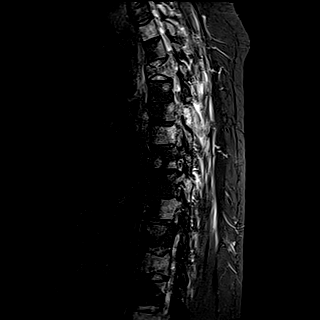
[im 17/17]
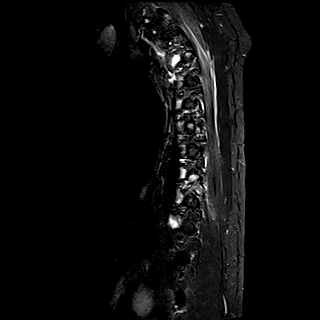

[Series 32: T1 · sagittal · 3.0mm · 1.00mm/px · 4 of 17 slices shown (2 of 2)]
[im 1/17]
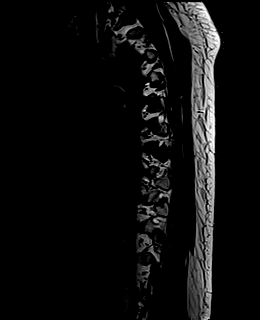
[im 6/17]
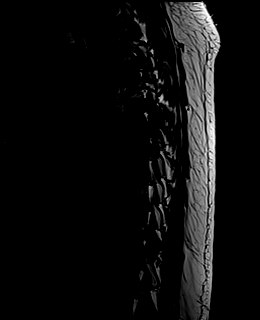
[im 11/17]
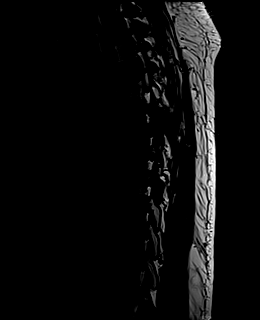
[im 17/17]
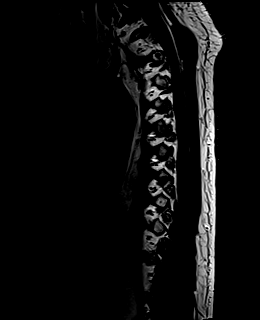

[Series 33: T2 · sagittal · 3.0mm · 0.83mm/px · 4 of 17 slices shown (1 of 2)]
[im 1/17]
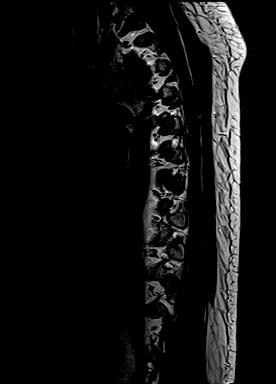
[im 6/17]
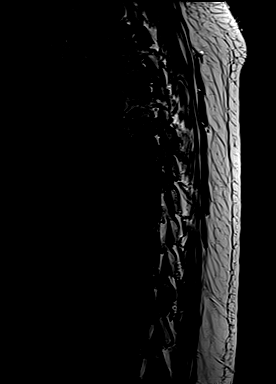
[im 11/17]
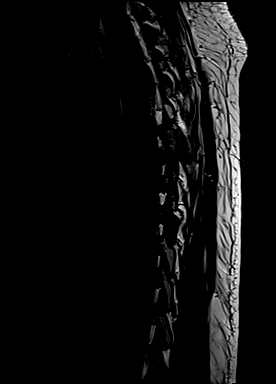
[im 17/17]
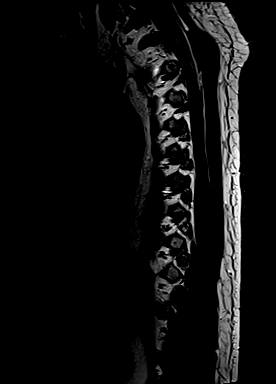

[Series 34: T2 · axial · 4.0mm · 0.78mm/px · z∈[-185,+124]mm · 14 of 63 slices shown (2 of 2)]
[im 1/63]
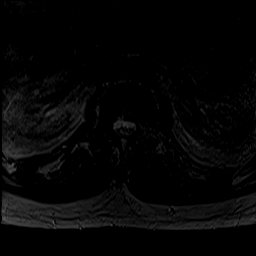
[im 5/63]
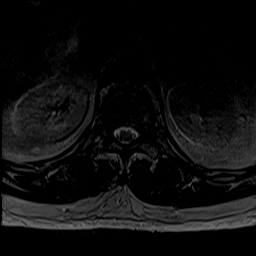
[im 10/63]
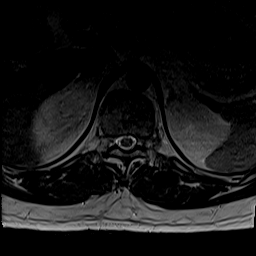
[im 15/63]
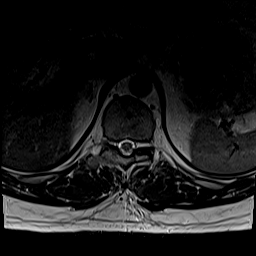
[im 20/63]
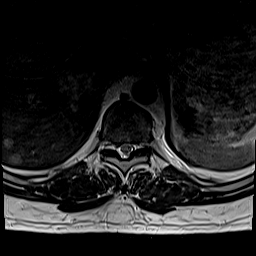
[im 24/63]
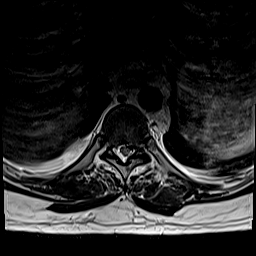
[im 29/63]
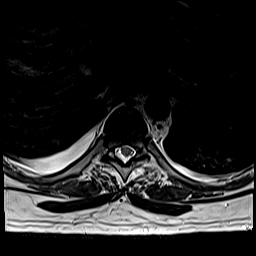
[im 34/63]
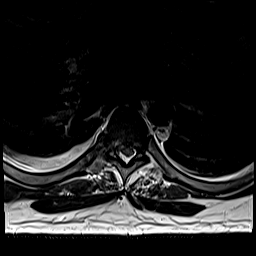
[im 39/63]
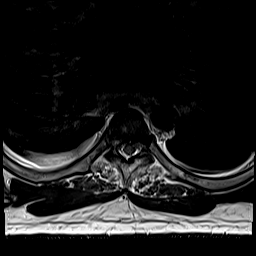
[im 43/63]
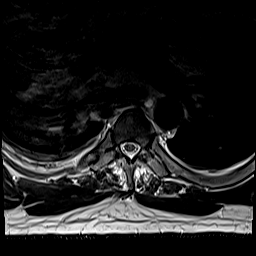
[im 48/63]
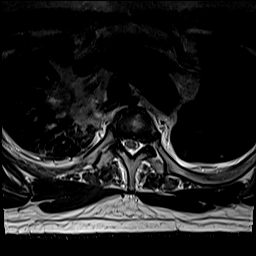
[im 53/63]
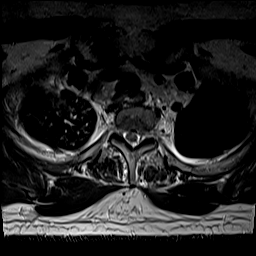
[im 58/63]
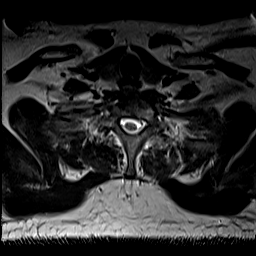
[im 63/63]
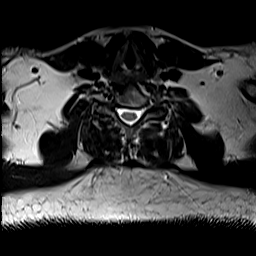

[Series 35: t2_me2d_tra · axial · 4.0mm · 0.39mm/px · z∈[-185,+124]mm · 8 of 63 slices shown]
[im 1/63]
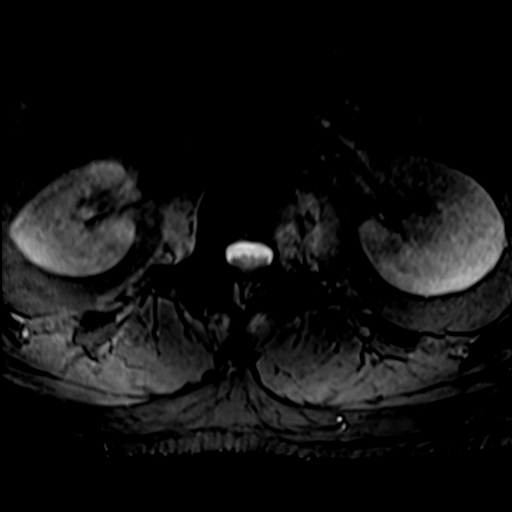
[im 10/63]
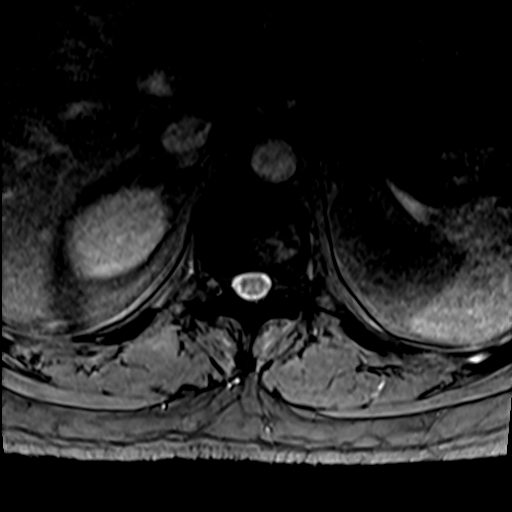
[im 20/63]
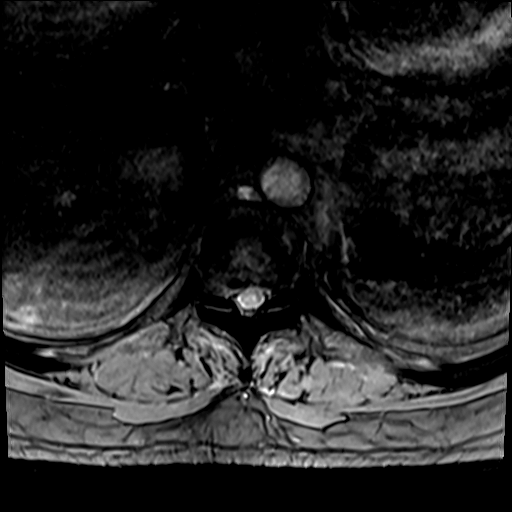
[im 29/63]
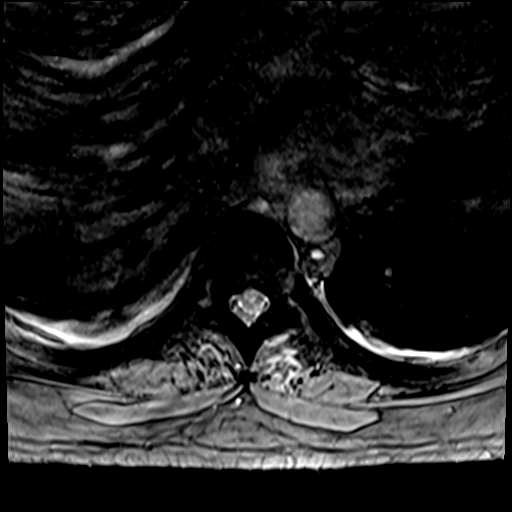
[im 34/63]
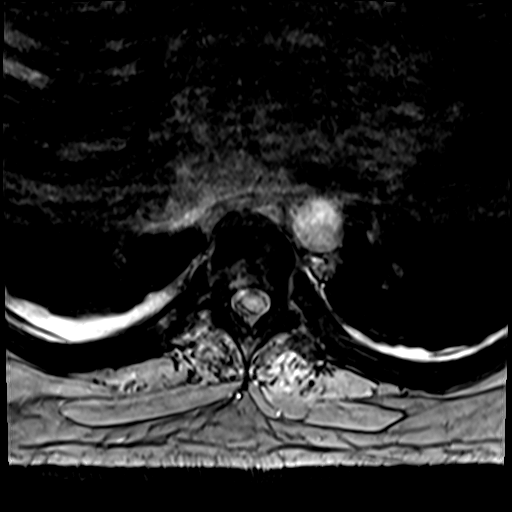
[im 43/63]
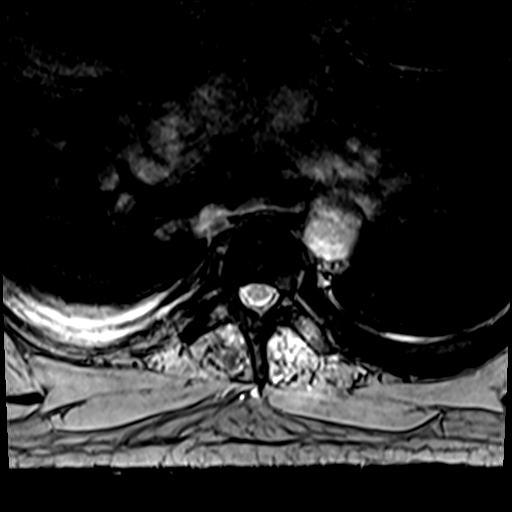
[im 53/63]
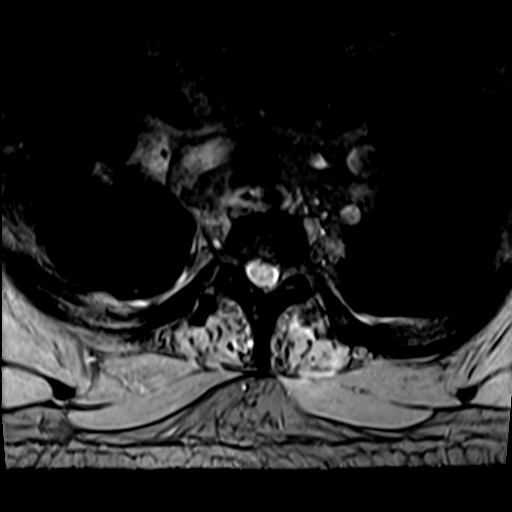
[im 63/63]
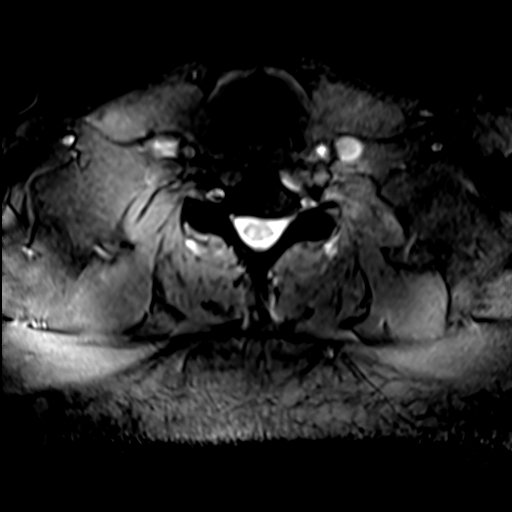

[38 of 48 positions shown; findings below may reference images not displayed]

FINDINGS: Limited cervical spine imaging:  Reported separately today.

Thoracic spine segmentation:  Normal.

Alignment:  Straightening of thoracic kyphosis.

Vertebrae: Thoracic vertebral metastases at every level.

Subtotal T1 vertebral replacement by tumor. Early extraosseous
extension from the spinous process into the paraspinal muscles.

T3 vertebral body pathologic compression fracture with 50% central
loss of vertebral body height, and some retropulsed bone/tumor.
Additionally, there is evidence of some epidural tumor extension at
that level (series 34, image 15) resulting in mild spinal stenosis,
borderline to mild cord compression. Moderate left T3 neural
foraminal stenosis.

Epidural tumor also at the T5 level where the vertebra is completely
replaced by tumor. Abundant extraosseous extension of tumor also
from the posterior elements into the posterior paraspinal
musculature. Mild spinal stenosis and borderline to mild cord
compression.

T6 near complete vertebral replacement by tumor with ventral and
foraminal epidural tumor extension. When superimposed on epidural
lipomatosis there is mild spinal stenosis and cord compression
(series 34, image 28). Moderate to severe right T6 foraminal
involvement.

Ventral epidural tumor extension at T7 without cord compression at
this time (series 34, image 32).

Ventral epidural tumor extension at T9 with mild spinal stenosis and
borderline to mild cord compression at this time (image 42).

T10 right side posterior element extraosseous extension of tumor
into the paraspinal muscles.

Cord: No thoracic cord signal abnormality despite the multiple
levels of epidural tumor.

Paraspinal and other soft tissues: Abnormal right upper lung and
mediastinum. Small layering right pleural effusion. Partially
visible liver metastases.

Disc levels:

There are some superimposed thoracic degenerative changes,
including:

-Mild degenerative thoracic spinal stenosis at T8-T9 related to left
eccentric disc and endplate degeneration, mild to moderate posterior
element degeneration.

-borderline to mild degenerative spinal stenosis at T11-T12 related
to right paracentral disc protrusion and mild facet hypertrophy.
IMPRESSION: 1. Bone metastases involving every thoracic spine level. Pathologic
fracture of T3 with up to 50% central vertebral loss of height, mild
retropulsion of bone in tumor.

2. Ventral epidural tumor with up to mild spinal stenosis and
borderline to mild cord compression at: T3, T5, T6, T7, T9.

3. Moderate or severe neural foraminal involvement by tumor at the
left T3, right T6 nerve levels.

4. Superimposed mild degenerative thoracic spinal stenosis at T8-T9
and T11-T12.

## 2019-06-17 IMAGING — MR MR LUMBAR SPINE W/O CM
4 of 5 series · 30 of 48 positions shown · non-contrast
Comparison: Thoracic spine MRI today. Lumbar CT, CT Chest, Abdomen,
and Pelvis yesterday. Lumbar MRI [DATE].

CLINICAL DATA: 33-year-old male with widely metastatic lung cancer.
Back pain.

EXAM:
MRI LUMBAR SPINE WITHOUT CONTRAST
TECHNIQUE: Multiplanar, multisequence MR imaging of the lumbar spine was
performed. No intravenous contrast was administered.

[Series 36: T1 · sagittal · 4.0mm · 0.81mm/px · 6 of 17 slices shown (1 of 2)]
[im 1/17]
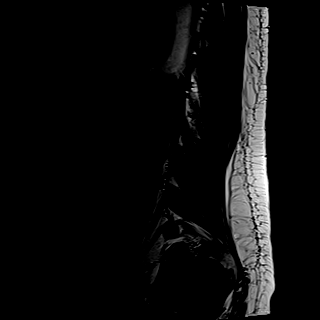
[im 4/17]
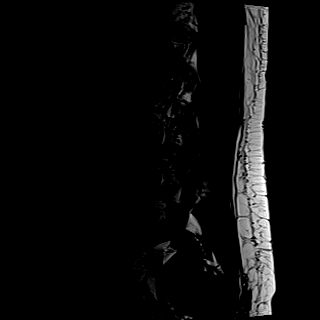
[im 7/17]
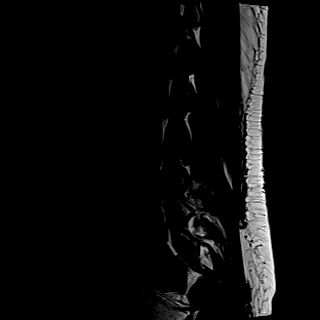
[im 10/17]
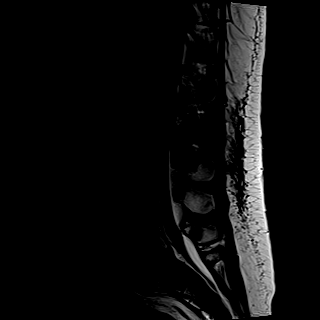
[im 13/17]
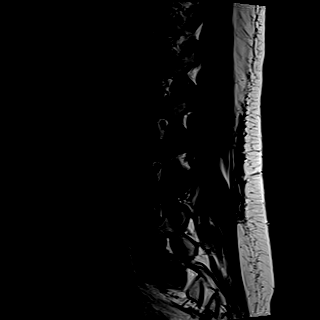
[im 17/17]
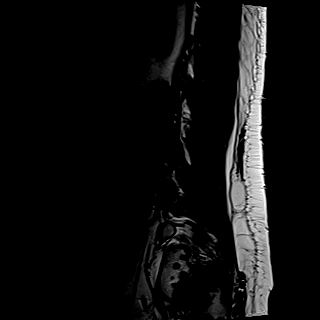

[Series 37: T2 · sagittal · 4.0mm · 0.81mm/px · 6 of 17 slices shown (1 of 2)]
[im 1/17]
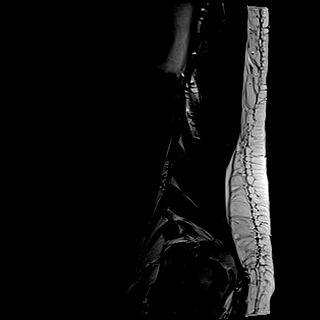
[im 4/17]
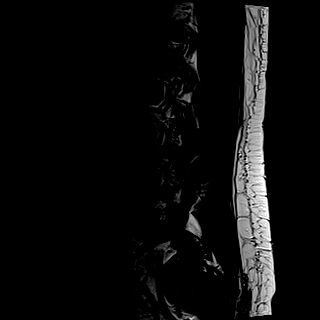
[im 7/17]
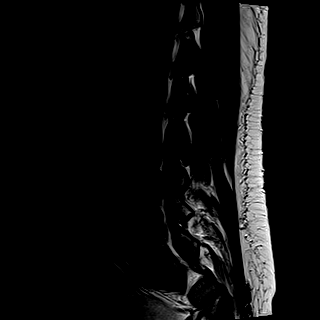
[im 10/17]
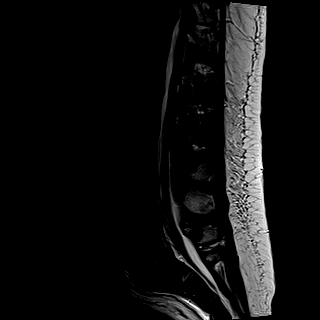
[im 13/17]
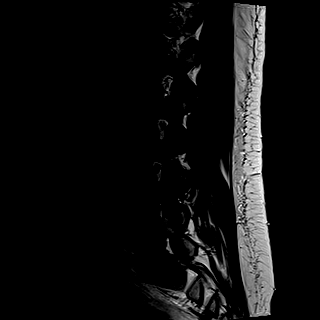
[im 17/17]
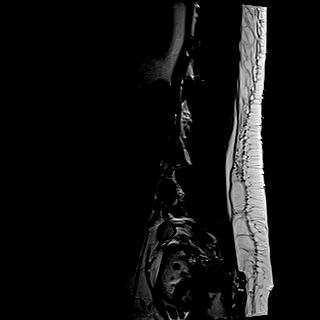

[Series 39: T2 · axial · 4.0mm · 0.62mm/px · z∈[-396,-169]mm · 9 of 43 slices shown (2 of 2)]
[im 1/43]
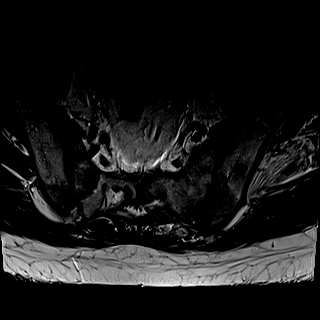
[im 7/43]
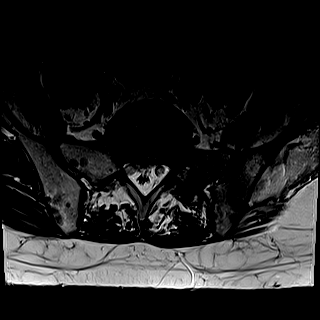
[im 13/43]
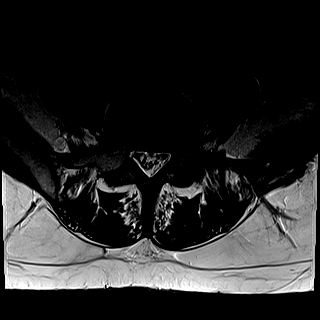
[im 19/43]
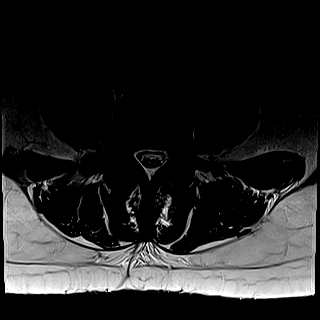
[im 22/43]
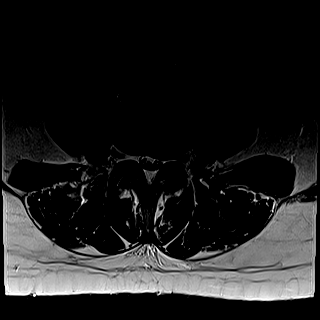
[im 25/43]
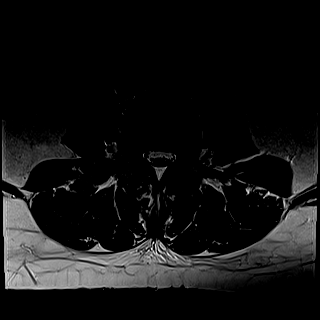
[im 31/43]
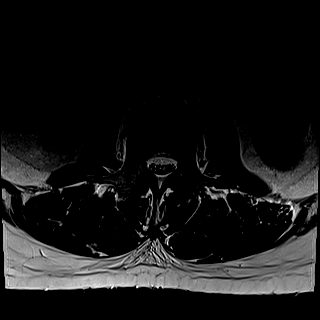
[im 37/43]
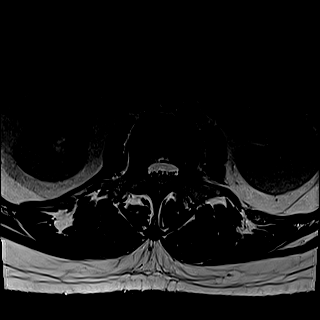
[im 43/43]
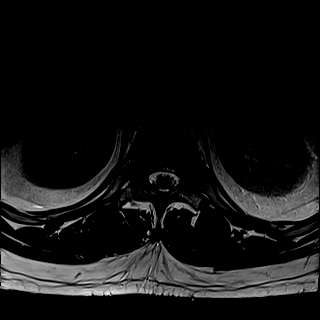

[Series 40: T1 · axial · 4.0mm · 0.43mm/px · z∈[-398,-169]mm · 9 of 43 slices shown (2 of 2)]
[im 1/43]
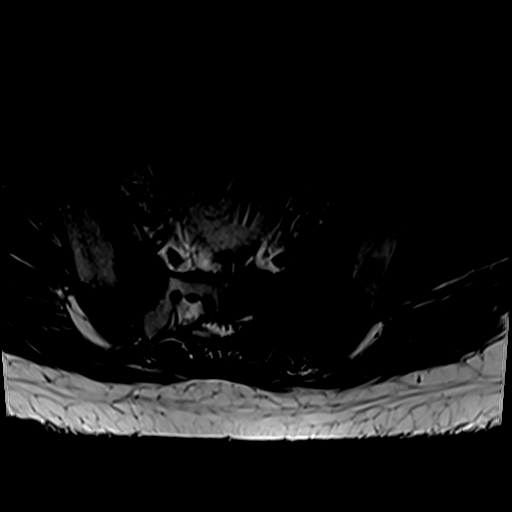
[im 7/43]
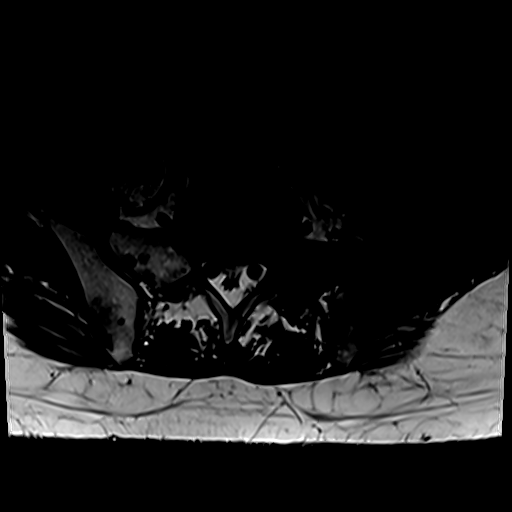
[im 13/43]
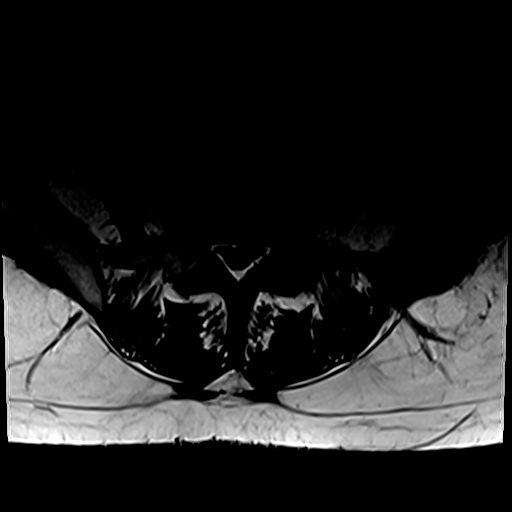
[im 19/43]
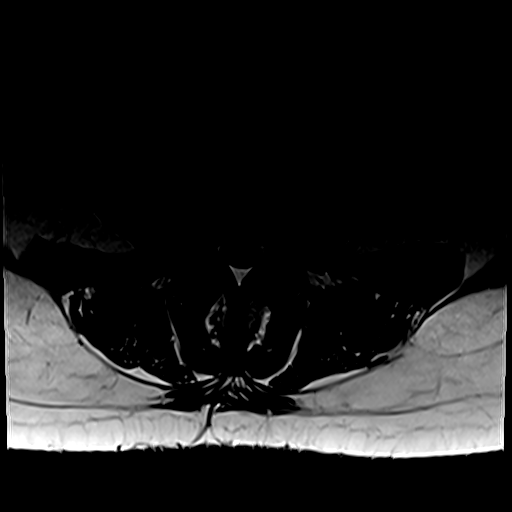
[im 22/43]
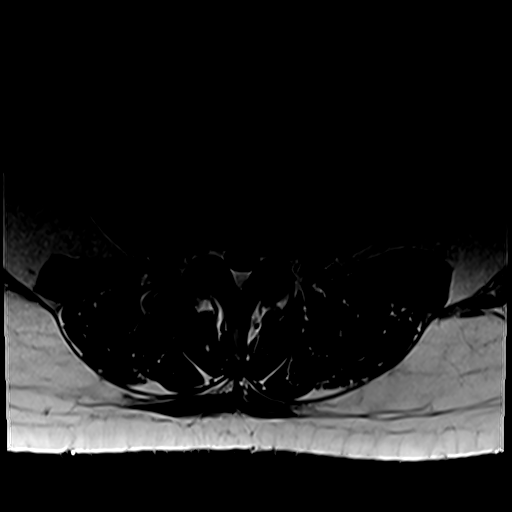
[im 25/43]
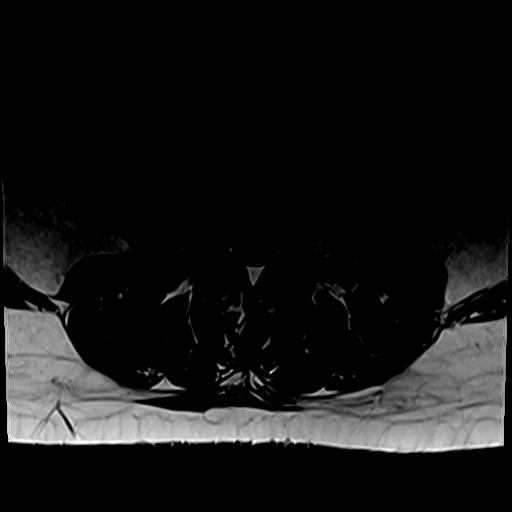
[im 31/43]
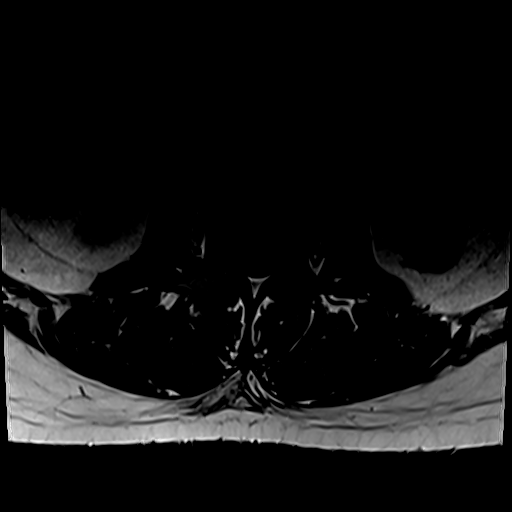
[im 37/43]
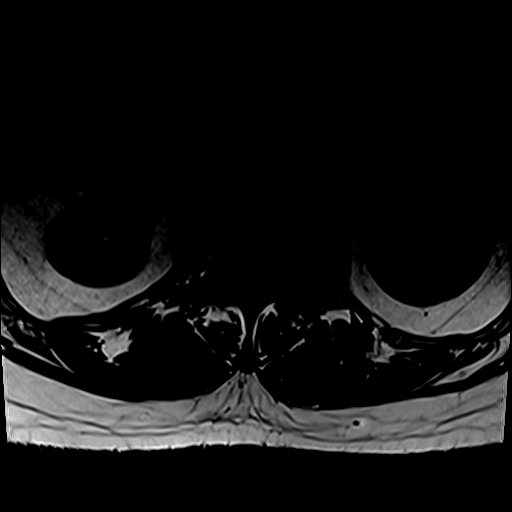
[im 43/43]
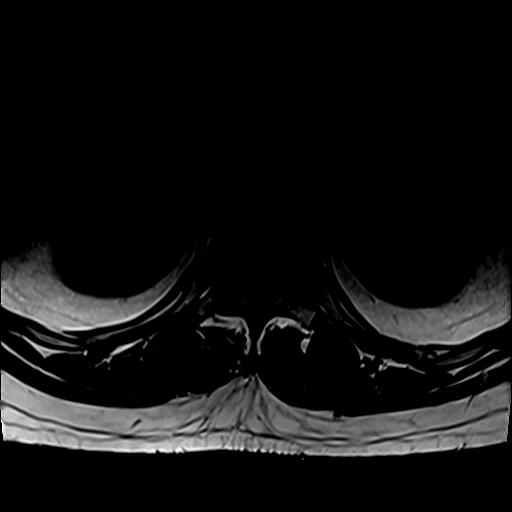

[30 of 48 positions shown; findings below may reference images not displayed]

FINDINGS: Segmentation: Normal, concordant with the thoracic spine numbering
today.

Alignment:  Stable lumbar lordosis since GADSON.

Vertebrae: Lumbar metastatic disease at all levels. Compared to
[DATE] lumbar vertebral tumor shows mild generalized
progression. Confluent sacral and medial iliac bone metastatic
disease redemonstrated.

Mild pathologic fracture of the L2 superior endplate. This has only
mildly progressed since [DATE], image 9). No other lumbar
pathologic fracture.

And although there is some early lumbar extraosseous tumor extension
(right L4 transverse process series 39, image 25), there is no
significant lumbar epidural or neural foraminal tumor at this time.

However, there is obliteration of the right S1, left S2 and S3
neural foramina by tumor.

Conus medullaris and cauda equina: Conus extends to the T12-L1
level. No lower spinal cord or conus signal abnormality. Cauda
equina nerve roots appear normal in the absence of contrast.

Paraspinal and other soft tissues: Stable visible abdominal viscera.

Disc levels:

Lumbar epidural lipomatosis. Superimposed lumbar degenerative
changes most notable for:

-L3-L4 left paracentral disc protrusion superimposed on disc bulge,
posterior element hypertrophy and epidural lipomatosis. Moderate
left lateral recess stenosis (left L4 nerve level) and mild spinal
stenosis.
IMPRESSION: 1. Lumbar metastatic disease at all levels with mild generalized
progression since the GADSON MRI. Mild pathologic fracture of the L2
superior endplate.

2. Occasional early extraosseous extension of tumor (right L4
transverse process), but no lumbar epidural or neural foraminal
tumor at this time.

3. Bulky sacral tumor re-demonstrated including severe involvement
of the right S1, left S2, and left S3 neural foramina.

## 2019-06-17 IMAGING — MR MR CERVICAL SPINE W/O CM
6 series · 35 of 48 positions shown · non-contrast
Comparison: CT Thoracic Spine, Lumbar Spine, CT Chest, Abdomen, and
Pelvis yesterday. Brain MRI [DATE].

CLINICAL DATA: 33-year-old male with widely metastatic lung cancer.
Back pain.

EXAM:
MRI CERVICAL SPINE WITHOUT CONTRAST
TECHNIQUE: Multiplanar, multisequence MR imaging of the cervical spine was
performed. No intravenous contrast was administered.

[Series 9: T1 · sagittal · 3.0mm · 0.69mm/px · 6 of 17 slices shown (1 of 2)]
[im 1/17]
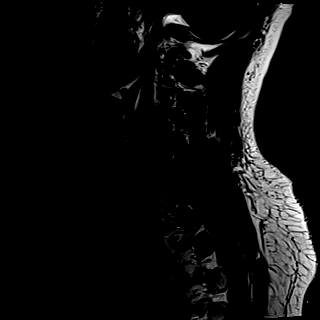
[im 4/17]
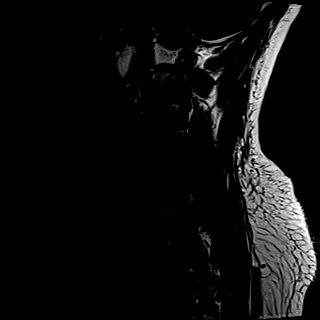
[im 7/17]
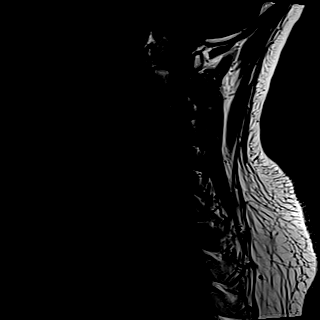
[im 10/17]
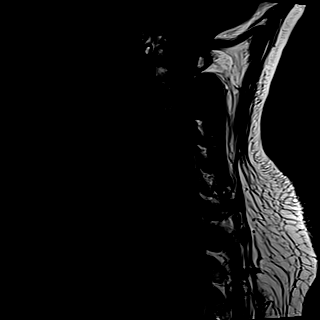
[im 13/17]
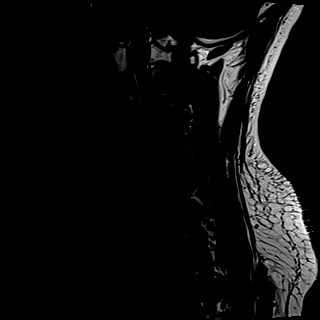
[im 17/17]
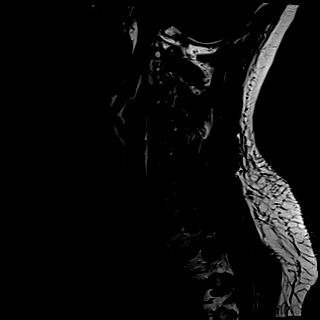

[Series 10: T2 · sagittal · 3.0mm · 0.69mm/px · 6 of 17 slices shown (1 of 2)]
[im 1/17]
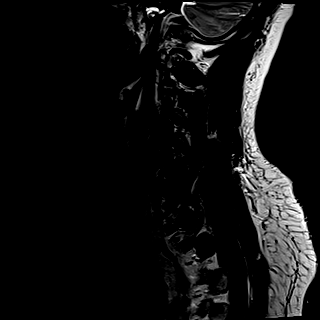
[im 4/17]
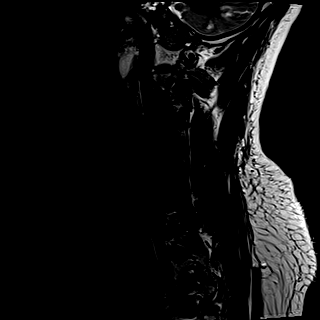
[im 7/17]
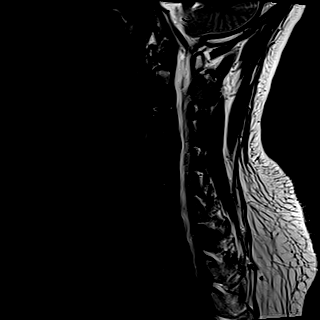
[im 10/17]
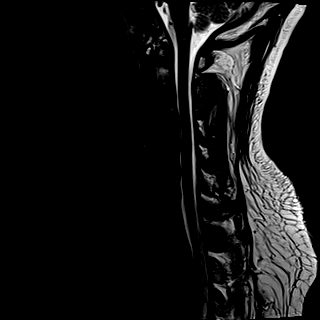
[im 13/17]
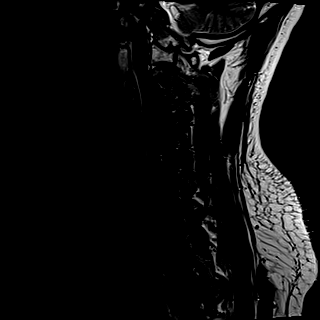
[im 17/17]
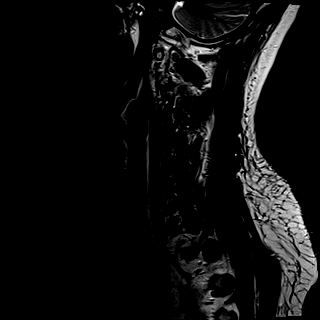

[Series 11: STIR · sagittal · 3.0mm · 0.86mm/px · 6 of 17 slices shown]
[im 1/17]
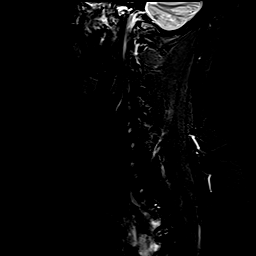
[im 4/17]
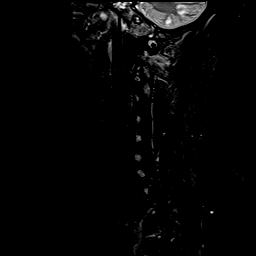
[im 7/17]
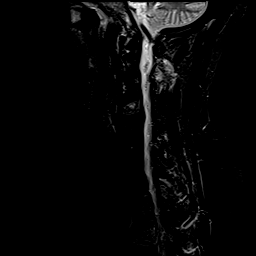
[im 10/17]
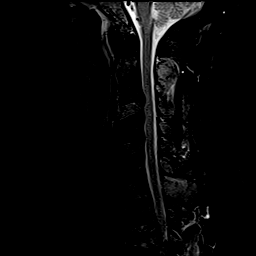
[im 13/17]
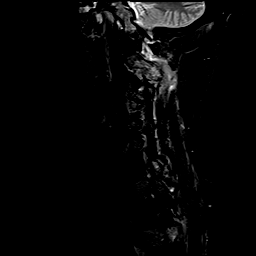
[im 17/17]
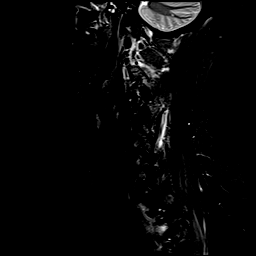

[Series 12: T2 · axial · 3.0mm · 0.70mm/px · z∈[+87,+191]mm · 8 of 31 slices shown (2 of 2)]
[im 1/31]
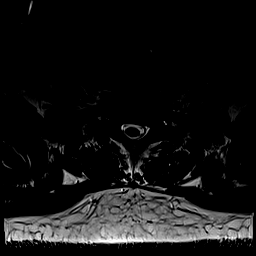
[im 4/31]
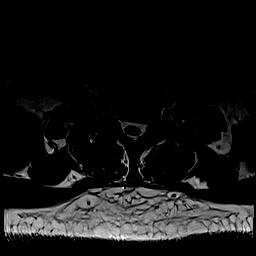
[im 11/31]
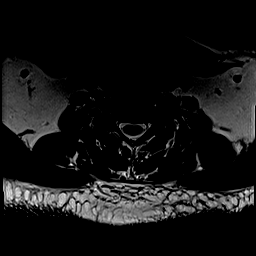
[im 14/31]
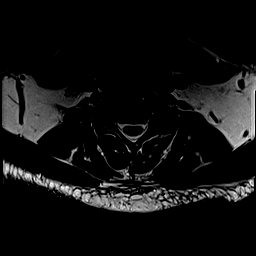
[im 17/31]
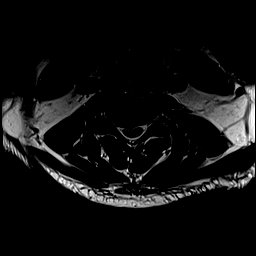
[im 21/31]
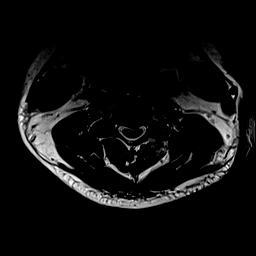
[im 27/31]
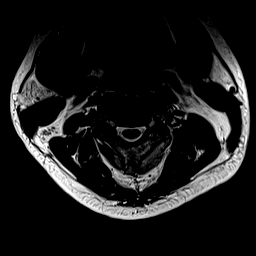
[im 31/31]
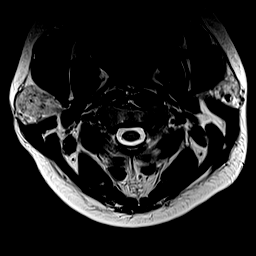

[Series 13: GRE · axial · 3.0mm · 0.35mm/px · 1 of 31 slices shown]
[im 1/31]
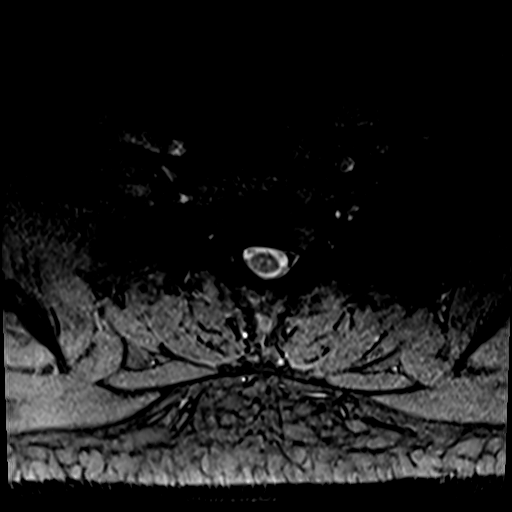

[Series 14: T1 · axial · 3.0mm · 0.35mm/px · z∈[+87,+191]mm · 8 of 31 slices shown (2 of 2)]
[im 1/31]
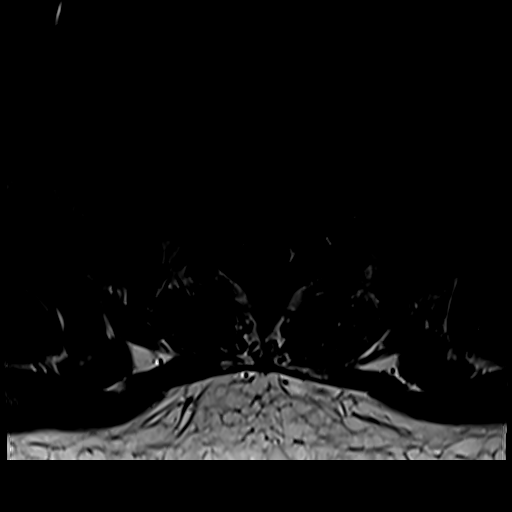
[im 4/31]
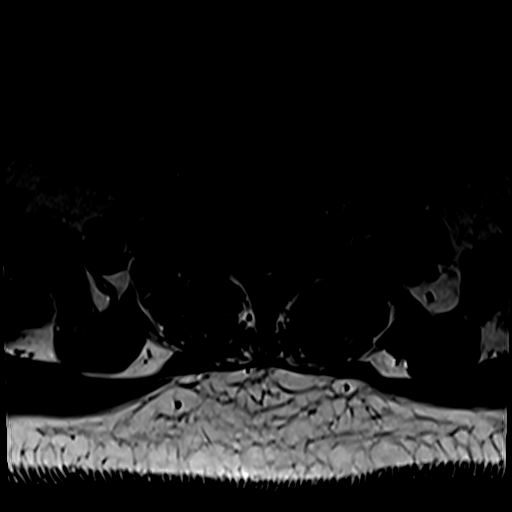
[im 11/31]
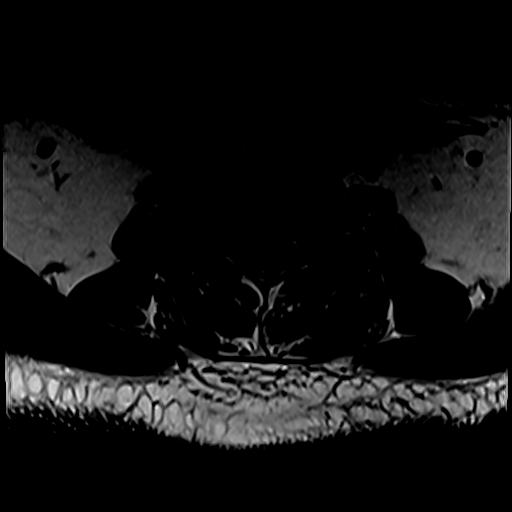
[im 14/31]
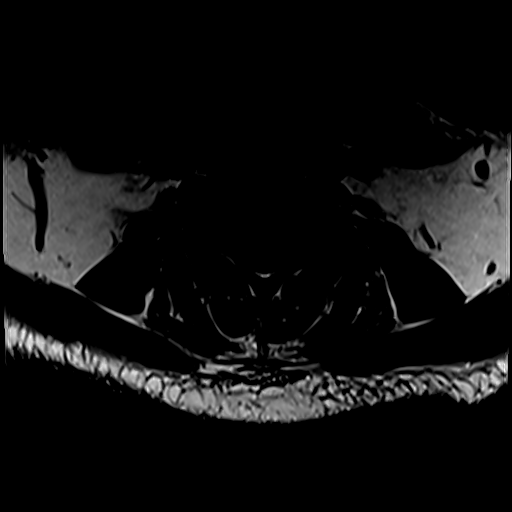
[im 17/31]
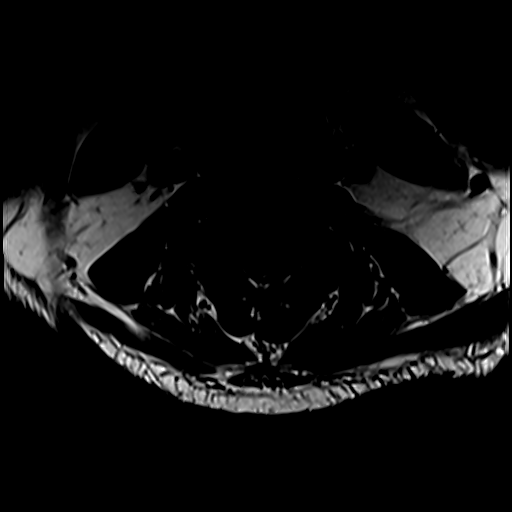
[im 21/31]
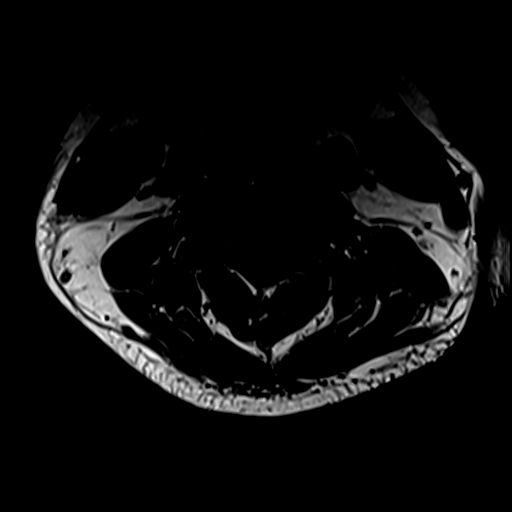
[im 27/31]
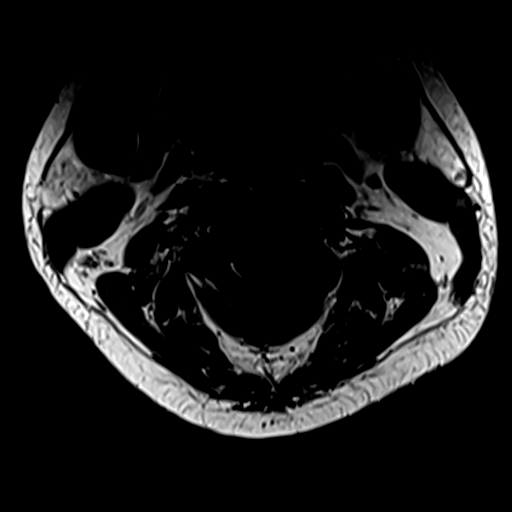
[im 31/31]
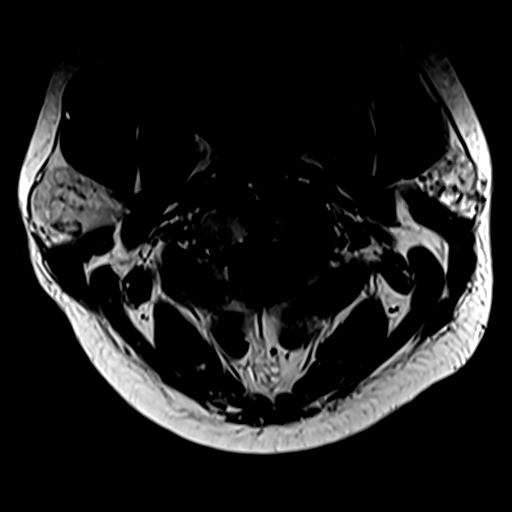

[35 of 48 positions shown; findings below may reference images not displayed]

FINDINGS: Alignment: Overall straightening of cervical lordosis.

Vertebrae: Metastatic disease to bone at virtually all cervical
spine levels, and throughout the visible skull base. Only the C1
vertebra seems to be spared at this time. Anterior and posterior
element metastases in the cervical spine. No pathologic cervical
vertebral fracture, although there is epidural and extraosseous
extension of tumor from the left C2 posterior elements (series 13,
image 4) with severe involvement of the left C3 neural foramen. No
significant cervical spinal stenosis. See thoracic spine findings
reported separately.

Cord: Spinal cord signal is within normal limits at all visualized
levels. No IV contrast administered.

Posterior Fossa, vertebral arteries, paraspinal tissues:
Cervicomedullary junction is within normal limits. Abnormal T2 and
STIR hyperintensity in the visible right cerebellar hemisphere
compatible with known brain metastases. No posterior fossa mass
effect is evident.

Preserved major vascular flow voids in the neck.

There is inflammation of the C2-C3 level posterior paraspinal
muscles greater on the left which is probably related to the C2
extraosseous tumor.

Disc levels:

Incidental degenerative changes including:

C3-C4: Small leftward disc herniation with mild spinal stenosis and
mild spinal cord mass effect.

C4-C5: Disc bulge and small rightward disc herniation with mild
spinal stenosis and mild right hemi cord mass effect. Associated
moderate to severe right C5 foraminal stenosis.
IMPRESSION: 1. Widespread osseous metastatic disease throughout the cervical
spine and the visible skull base.

2. Epidural and extraosseous extension of tumor from the left C2
posterior elements with severe involvement of the left C3 neural
foramen, but no malignant spinal stenosis.
There is mild degenerative cervical spinal stenosis at C3-C4 and
C4-C5.

3. No pathologic cervical vertebral fracture.

4. See Thoracic Spine findings reported separately.

## 2019-06-17 IMAGING — CT CT T SPINE W/O CM
3 series · 10 of 33 positions shown, 11 images · non-contrast
Comparison: [DATE]

CLINICAL DATA: Metastatic lung cancer, back pain

EXAM:
CT THORACIC SPINE WITHOUT CONTRAST
TECHNIQUE: Multidetector CT images of the thoracic were obtained using the
standard protocol without intravenous contrast.

[Series 3: t spine axials st · axial · 0.35mm/px · z∈[-252,-104]mm · 2 of 161 slices shown, 3 images]
[im 50/161  soft-tissue]
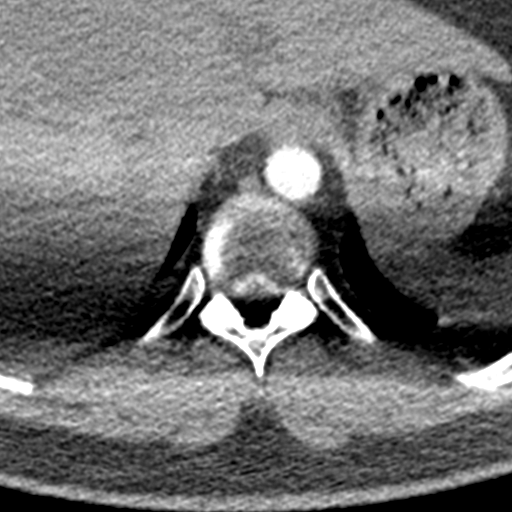
[im 50/161  bone]
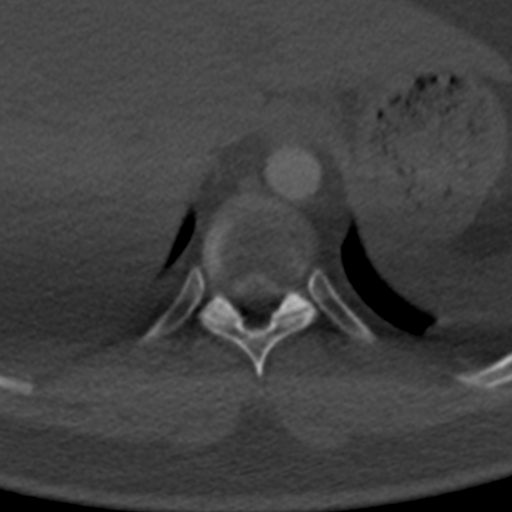
[im 124/161  bone]
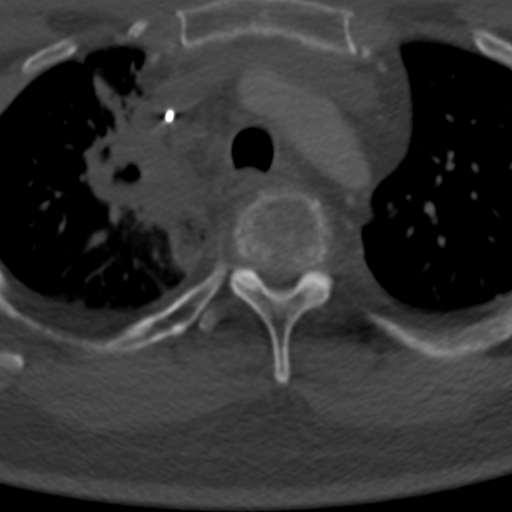

[Series 4: cor t spine coronal bone · coronal · 0.29mm/px · 3 of 68 slices shown]
[im 14/68  bone]
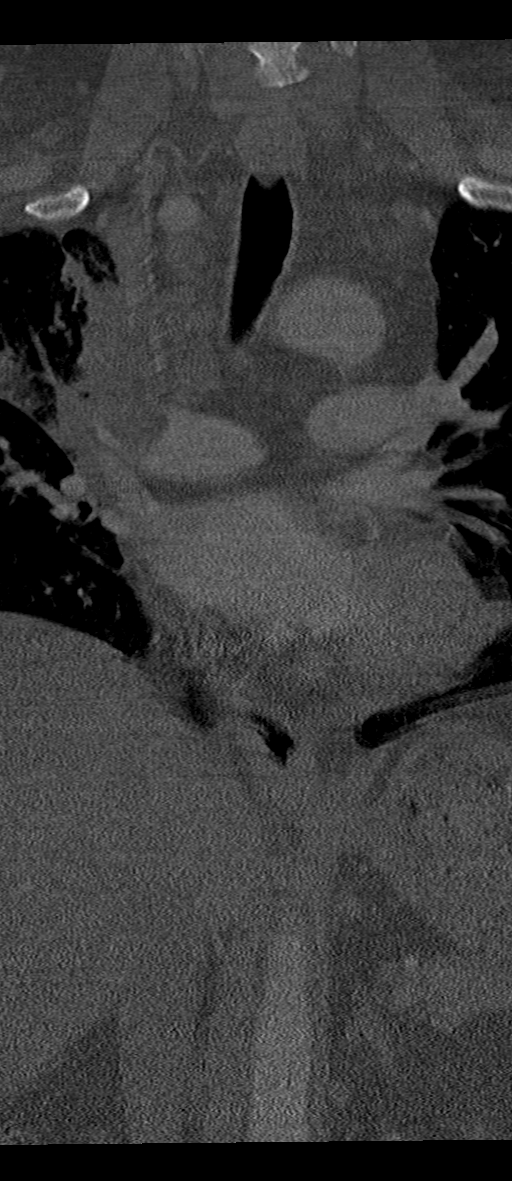
[im 27/68  bone]
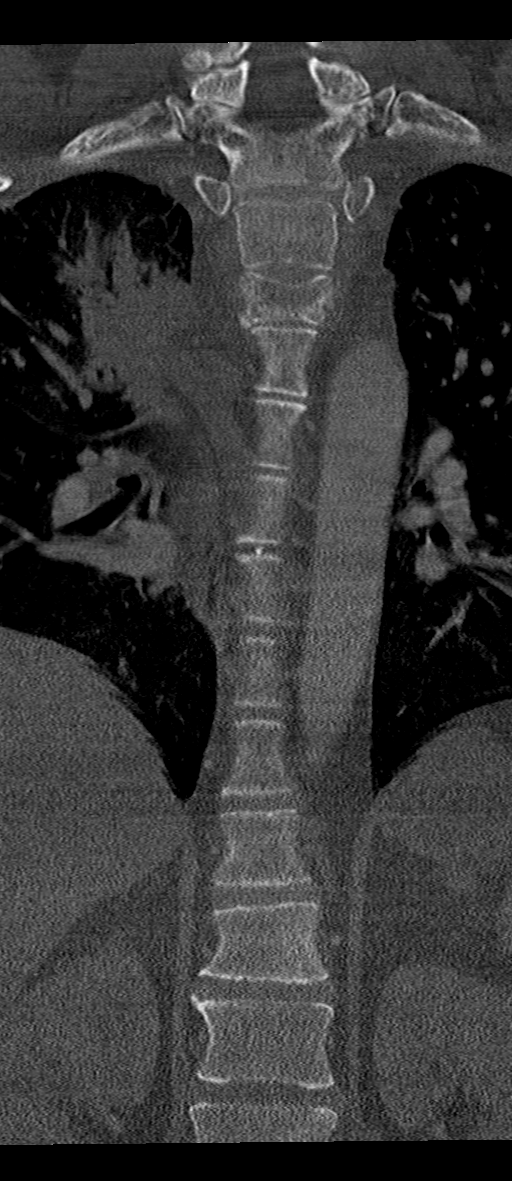
[im 41/68  bone]
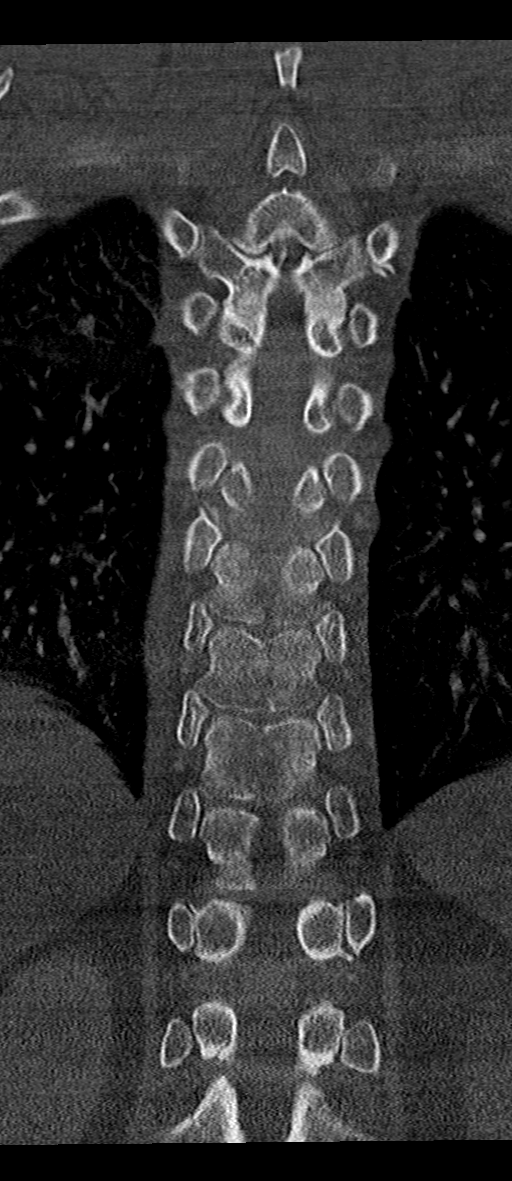

[Series 7: sag t spine sag st · sagittal · 0.34mm/px · 5 of 74 slices shown]
[im 25/74  bone]
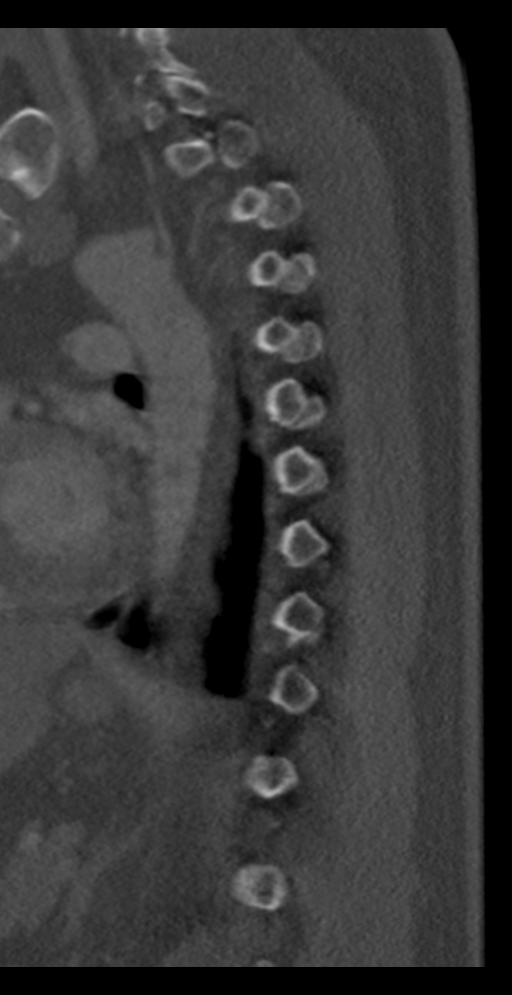
[im 31/74  bone]
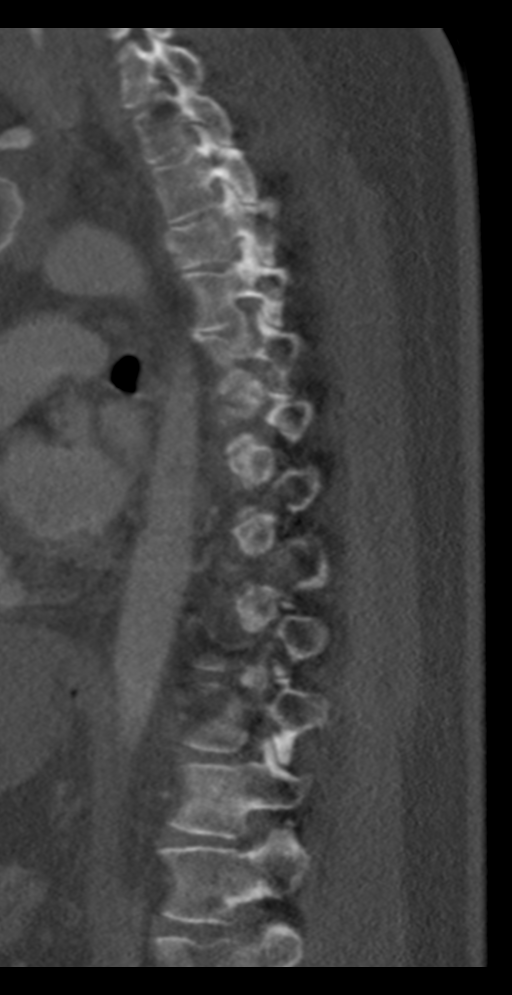
[im 37/74  bone]
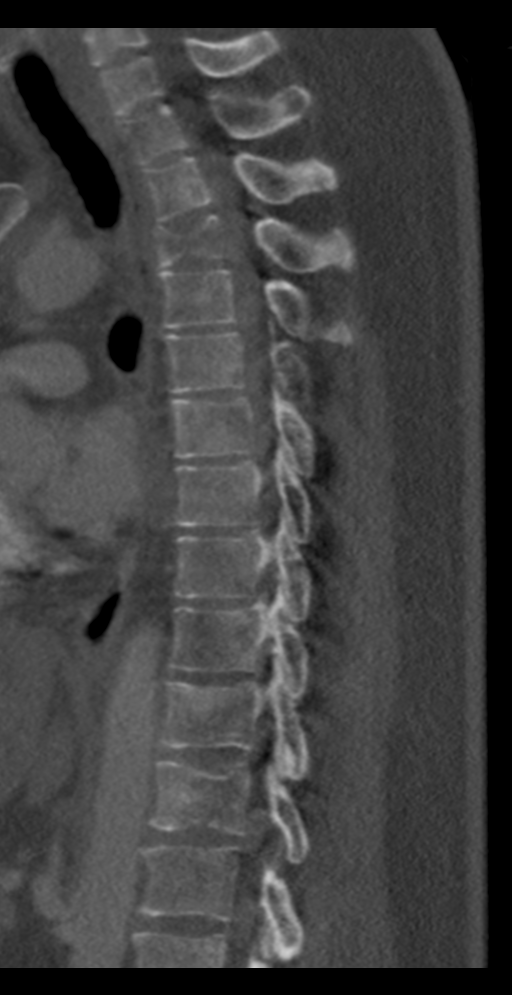
[im 43/74  bone]
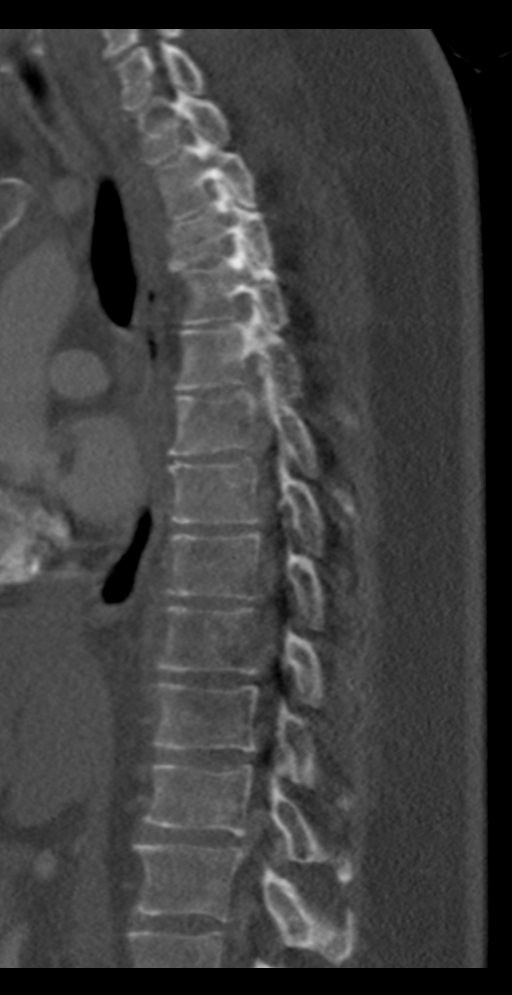
[im 49/74  bone]
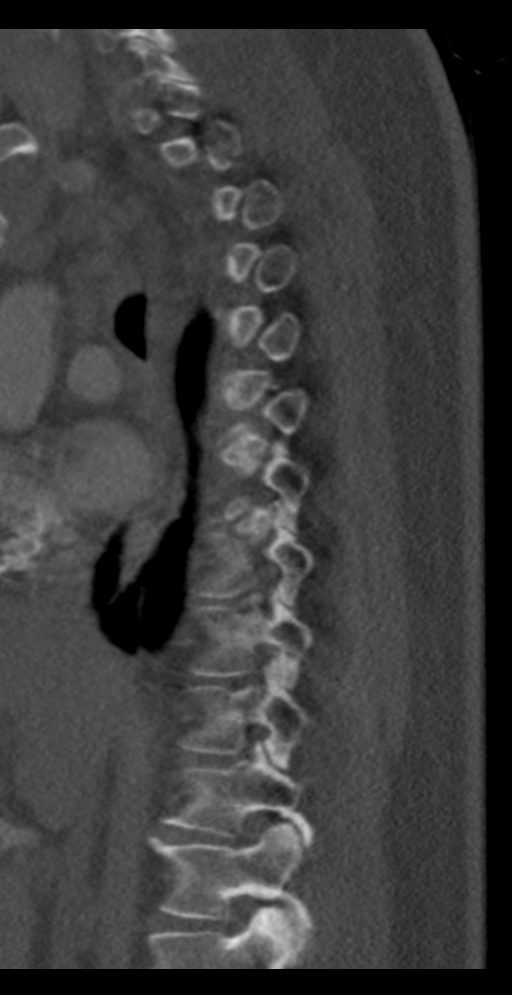

[10 of 33 positions shown; findings below may reference images not displayed]

FINDINGS: Alignment: Alignment is anatomic.

Vertebrae: Diffuse metastatic disease is seen throughout the
entirety of the thoracic spine. There is moderate appearance and
destruction of the T3 vertebral body with pathologic fracture
centrally with 50% loss of vertebral body height.

Paraspinal and other soft tissues: Please refer to corresponding
chest CT report describing a cavitating right upper lobe mass, right
hilar mass, and diffuse mediastinal adenopathy. Trace bilateral
pleural effusions are noted. There are chronic right lower lobe
segmental pulmonary emboli.

Disc levels: As result of the T3 pathologic fracture and metastatic
disease described above, there is approximately 50% narrowing of the
central canal at T3.

At T8 and T9, there are expansile metastatic lesions resulting in
bowing of the posterior margin of the vertebral bodies with
estimated 50-75% narrowing of the central canal and likely cord
compression.
IMPRESSION: 1. Diffuse bony metastases throughout the entirety of the thoracic
spine, most pronounced at T3, T8, and T9 with resulting central
canal narrowing and likely cord compression.
2. Pathologic fracture of the T3 vertebral body, with 50% loss of
height centrally.
3. Please refer to CT chest report describing cavitating right upper
lobe mass, right hilar mass, and mediastinal adenopathy.

## 2019-06-17 IMAGING — CR DG CHEST 2V
2 series · 2 of 2 positions shown · non-contrast
Comparison: [DATE]

CLINICAL DATA: Chest pain, history of prior PE

EXAM:
CHEST - 2 VIEW

[w chest lat]
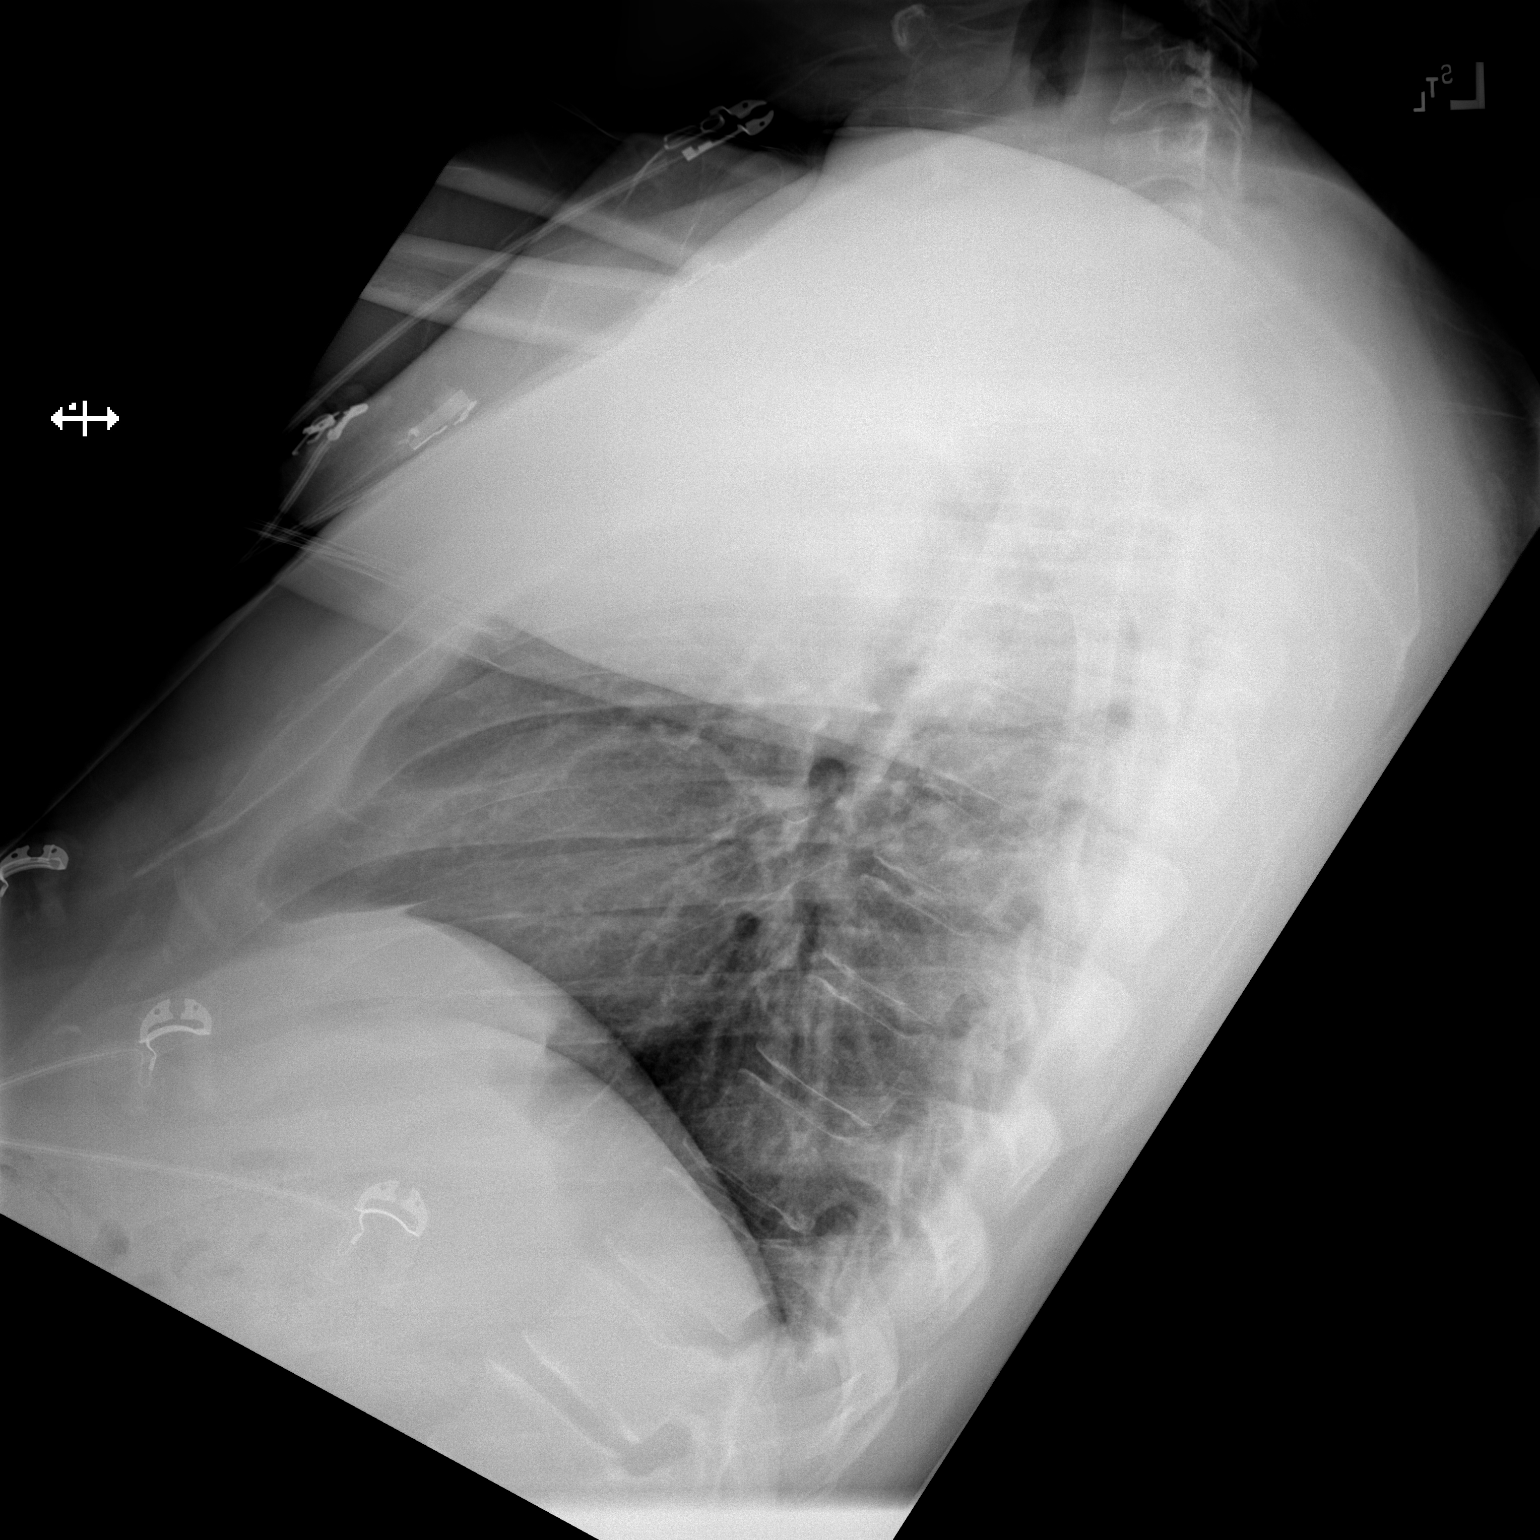

[x chest ap]
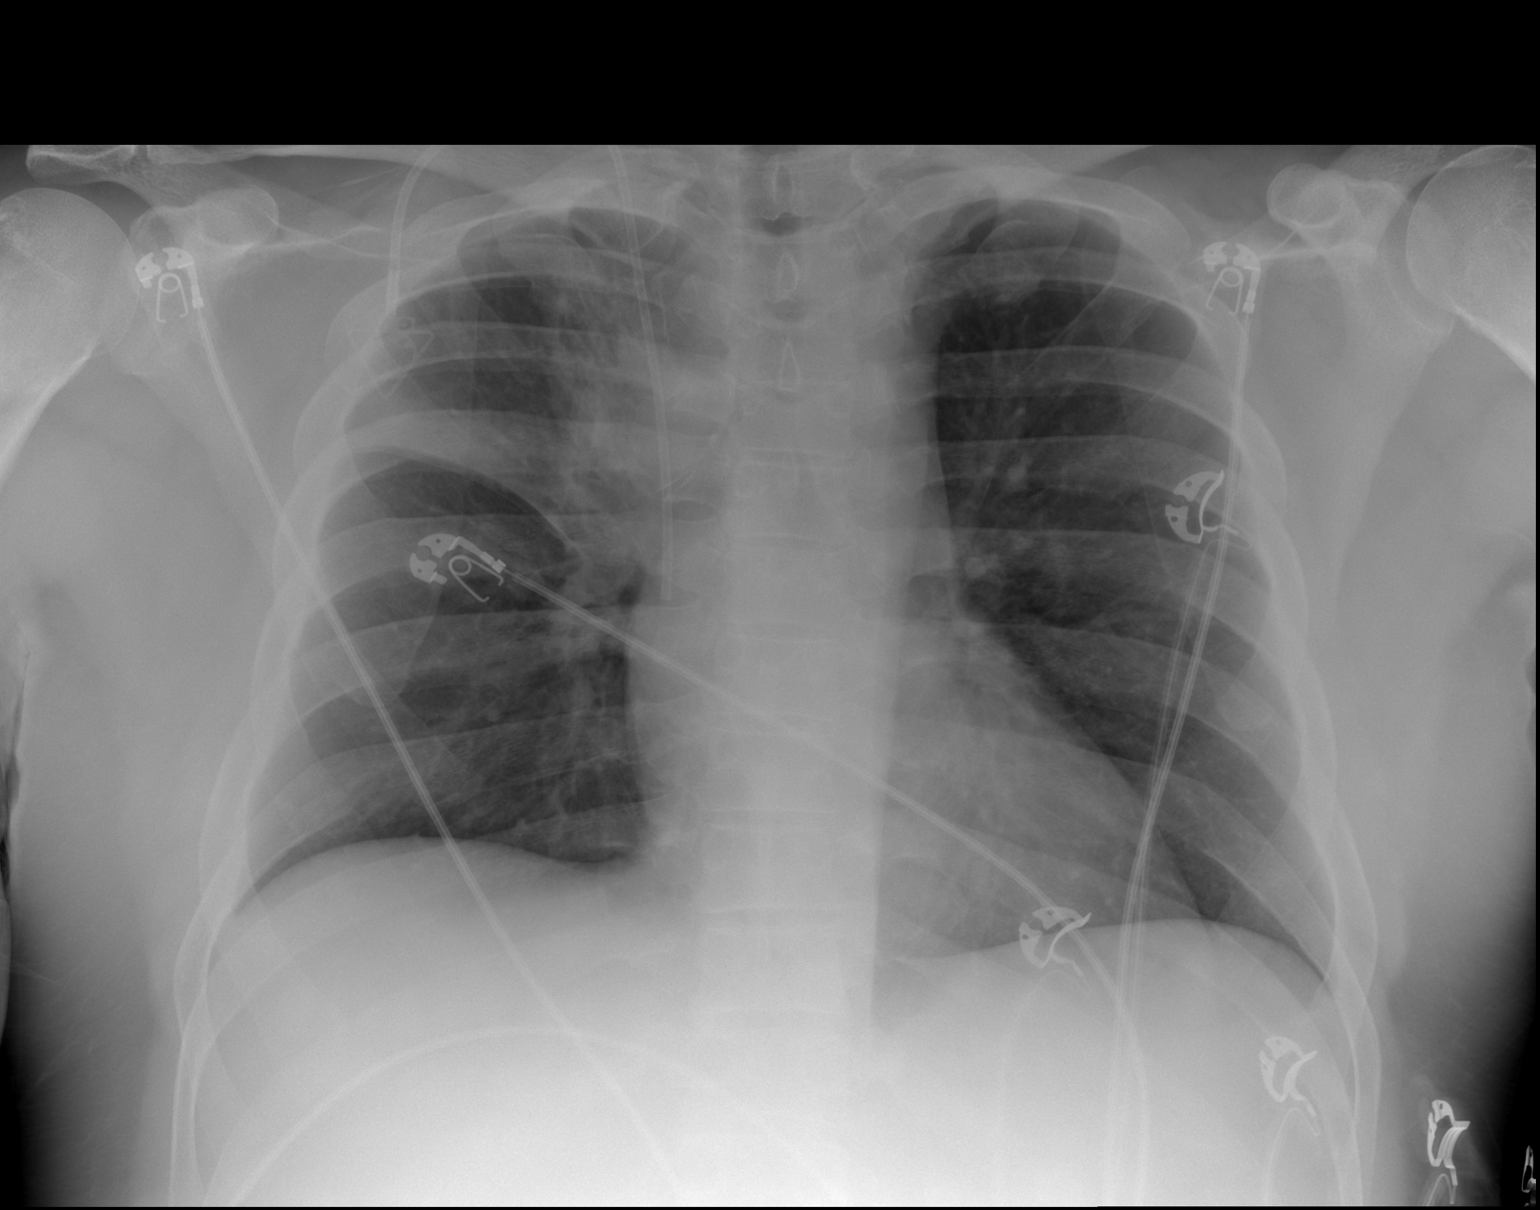

[2 of 2 positions shown; findings below may reference images not displayed]

FINDINGS: Cardiac shadow is within normal limits. Left lung is clear. Fullness
in the right hilum and suprahilar region is noted with volume loss
in the upper lobe consistent with the known mass lesion. Right chest
wall port is seen in satisfactory position.
IMPRESSION: Increasing fullness in the right hilar and suprahilar region with
volume loss in the upper lobe slightly progressed when compared with
the prior exam.

## 2019-06-17 IMAGING — CT CT L SPINE W/O CM
3 series · 12 of 33 positions shown, 14 images · non-contrast
Comparison: [DATE]

CLINICAL DATA: Low back pain, metastatic lung cancer

EXAM:
CT LUMBAR SPINE WITHOUT CONTRAST
TECHNIQUE: Multidetector CT imaging of the lumbar spine was performed without
intravenous contrast administration. Multiplanar CT image
reconstructions were also generated.

[Series 1: axial st l spine · axial · 0.38mm/px · z∈[-523,-325]mm · 4 of 144 slices shown, 5 images]
[im 23/144  soft-tissue]
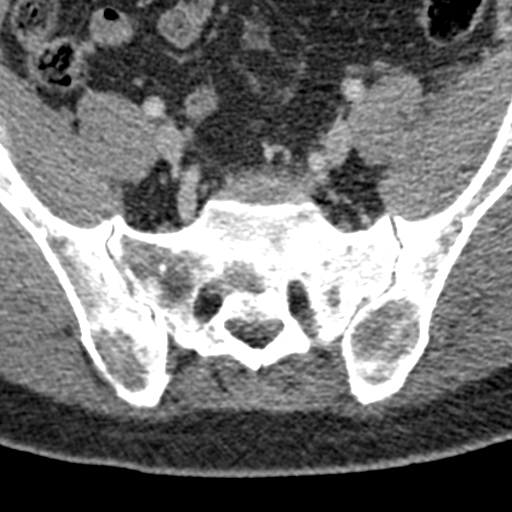
[im 23/144  bone]
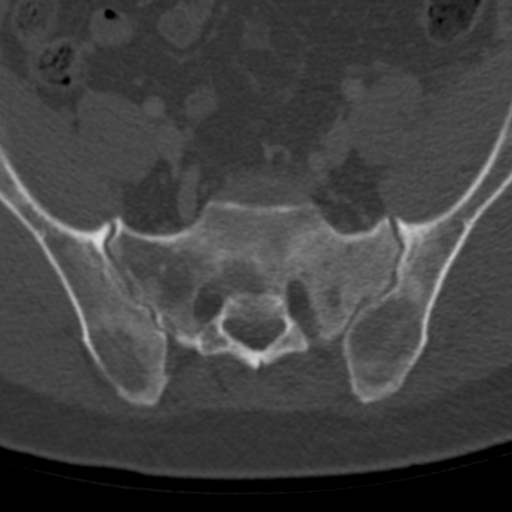
[im 56/144  bone]
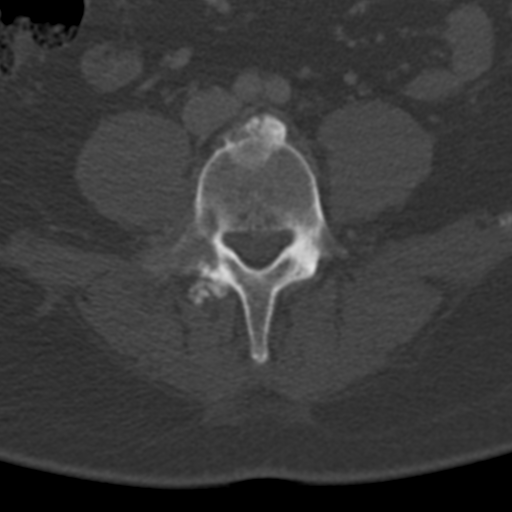
[im 89/144  bone]
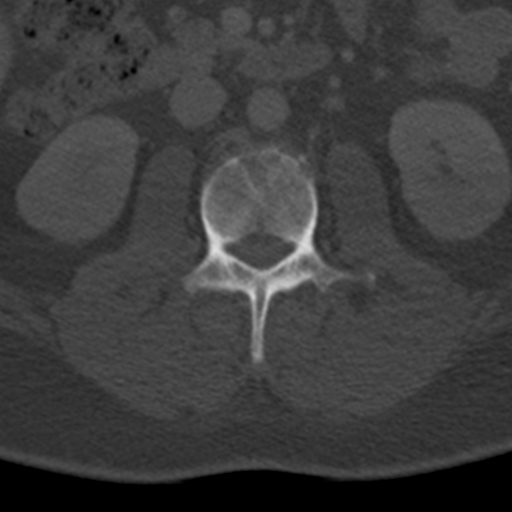
[im 122/144  bone]
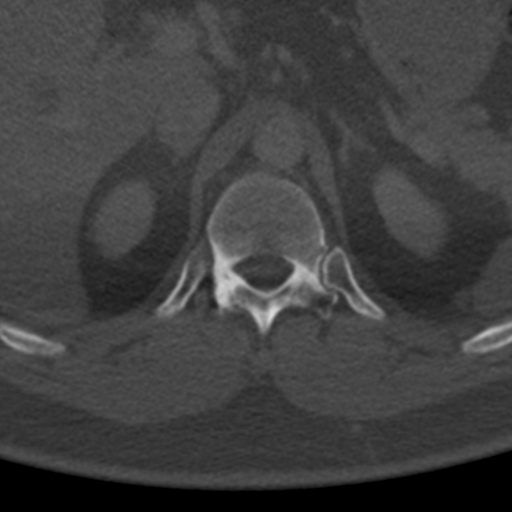

[Series 4: cor coronal st l spine · coronal · 0.39mm/px · 3 of 83 slices shown]
[im 17/83  bone]
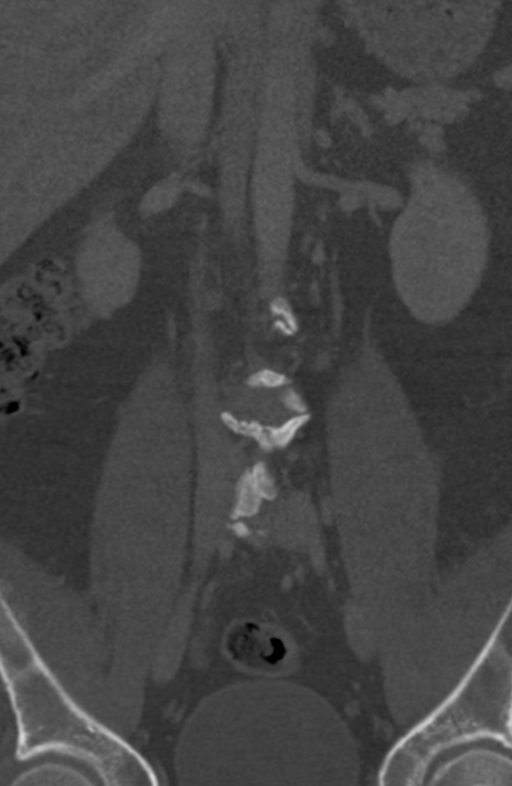
[im 33/83  bone]
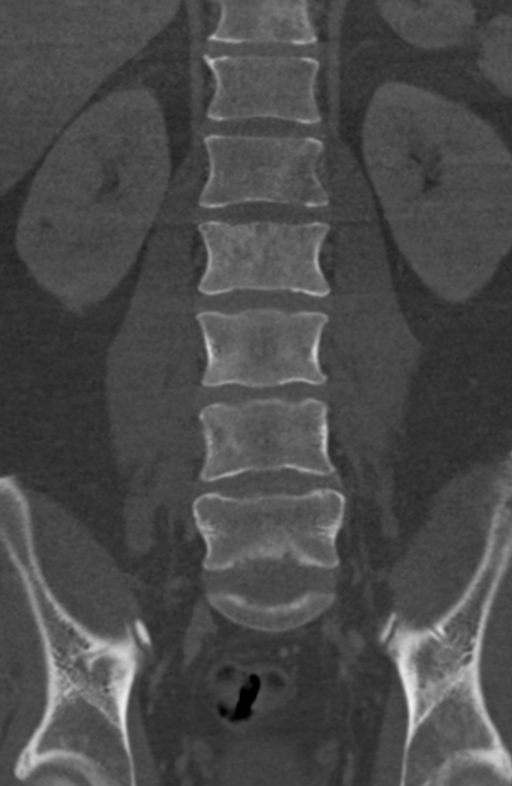
[im 50/83  bone]
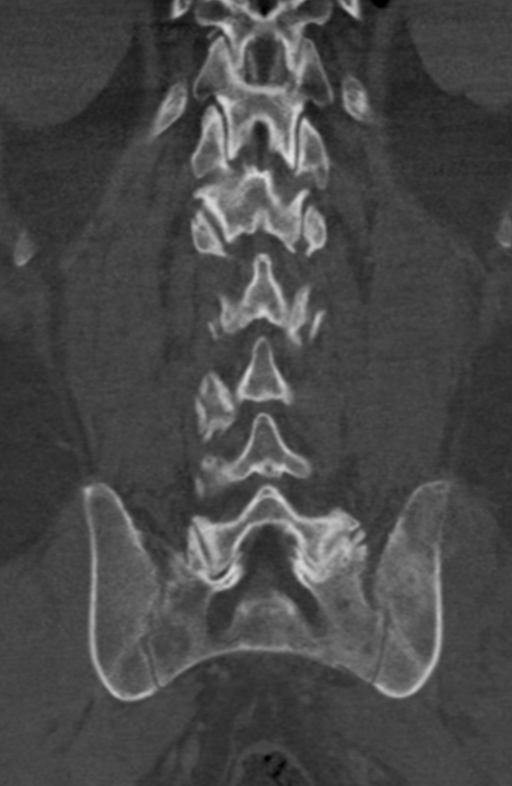

[Series 5: sag sag st l spine · sagittal · 0.37mm/px · 5 of 85 slices shown, 6 images]
[im 29/85  bone]
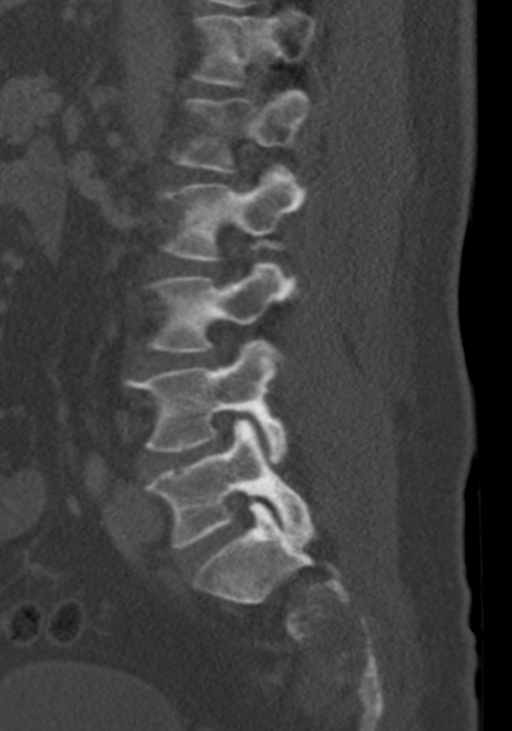
[im 36/85  bone]
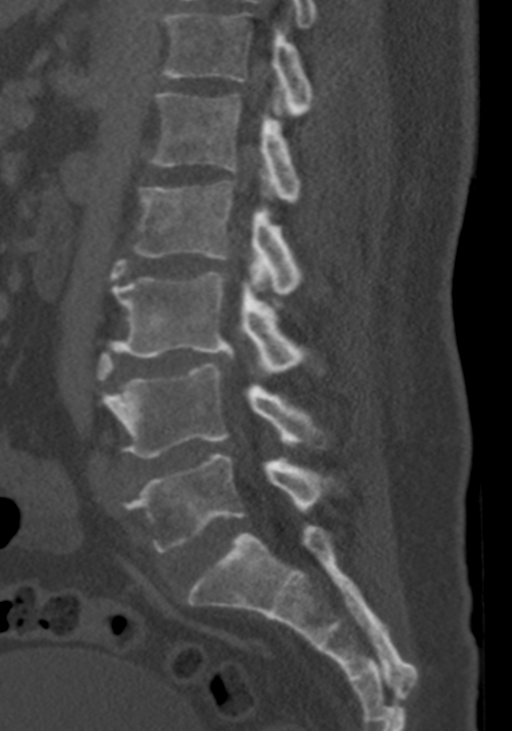
[im 43/85  soft-tissue]
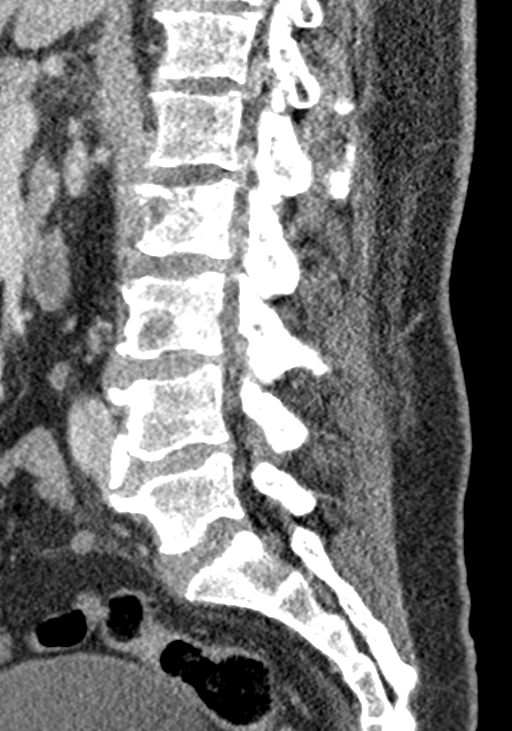
[im 43/85  bone]
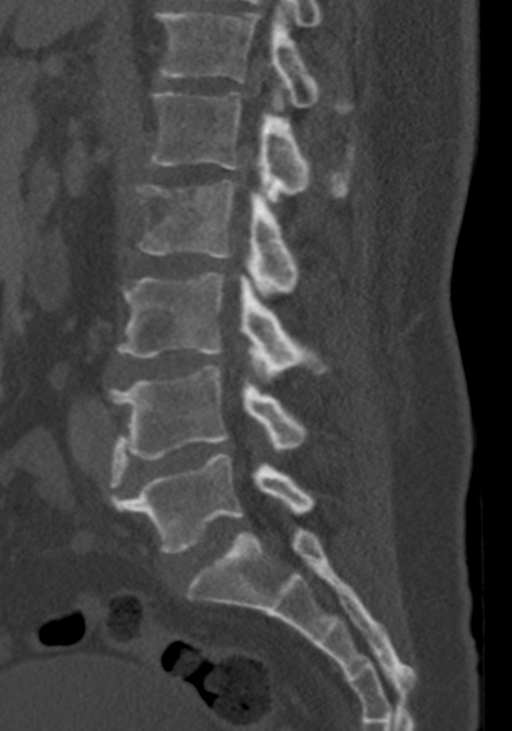
[im 50/85  bone]
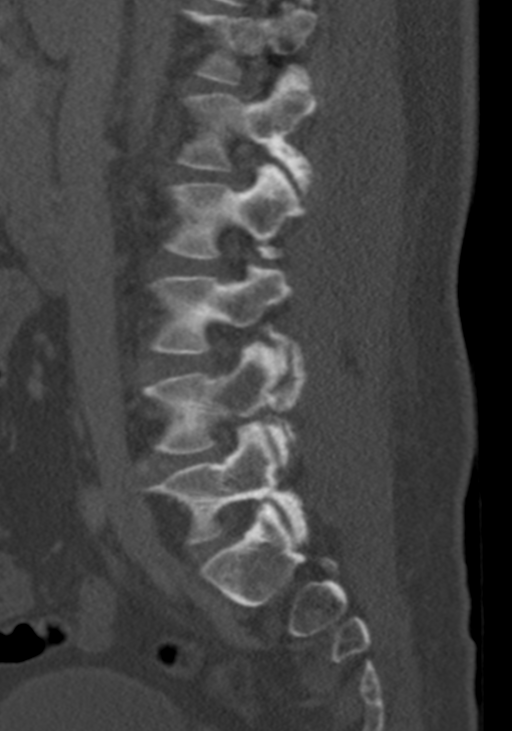
[im 57/85  bone]
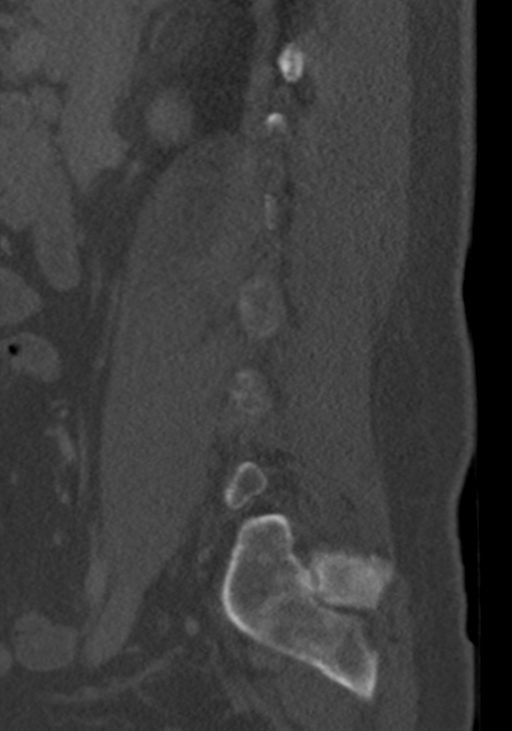

[12 of 33 positions shown; findings below may reference images not displayed]

FINDINGS: Segmentation: There are 5 non-rib-bearing lumbar type vertebral
bodies.

Alignment: Alignment is anatomic.

Vertebrae: There is diffuse metastatic disease, with multiple lytic
lesions throughout the lumbar spine. These are most pronounced
within the left aspect of the L1 vertebral body extending into the
left pedicle, anterior aspect of the L2 vertebral body, right L3
transverse process and central aspect L3 vertebral body, right L4
pedicle and transverse process.

Additional lytic lesions are seen within the bilateral iliac bones
and sacrum. Bony destruction and soft tissue mass obliterate the
left S2/S3 and S3/S4 neural foramen.

I do not see any pathologic fracture within the lumbar spine.

Paraspinal and other soft tissues: Soft tissue mass results in
obliteration of the left S2/S3 and S3/S4 neural foramen. Likely soft
tissue component arising from the L1 metastatic lesion with slight
enlargement of the left psoas muscle.

Disc levels: There is multilevel lumbar spondylosis. Mild central
canal stenosis is seen at L1/L2, L2/L3, and L3/L4. At L3/L4 there is
left predominant lateral recess and neural foraminal encroachment as
well.
IMPRESSION: 1. Diffuse bony metastatic disease throughout the lumbar spine,
sacrum, and bilateral iliac bones as above.
2. There are no pathologic fractures. Soft tissue mass and bony
destruction obliterate the left S2/S3 and S3/S4 neural foramen.
3. Multilevel lumbar spondylosis most pronounced at L3/L4 with left
predominant neural foraminal and lateral recess encroachment.

## 2019-06-17 IMAGING — CT CT ABD-PELV W/ CM
2 of 4 series · 16 of 46 positions shown, 18 images · IV contrast (omnipaque)
Comparison: [DATE]

CLINICAL DATA: Metastatic lung cancer, chest pain, lower and mid
back pain

EXAM:
CT ABDOMEN AND PELVIS WITH CONTRAST
TECHNIQUE: Multidetector CT imaging of the abdomen and pelvis was performed
using the standard protocol following bolus administration of
intravenous contrast.
CONTRAST:  100mL OMNIPAQUE IOHEXOL 350 MG/ML SOLN

[Series 4: axial st · axial · 0.82mm/px · z∈[-691,-226]mm · 13 of 105 slices shown, 15 images]
[im 6/105  soft-tissue]
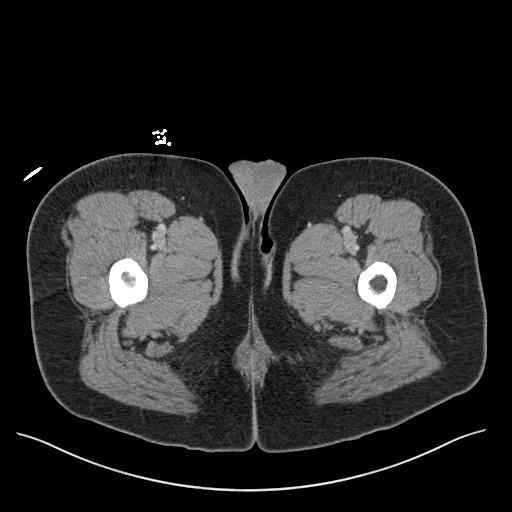
[im 6/105  bone]
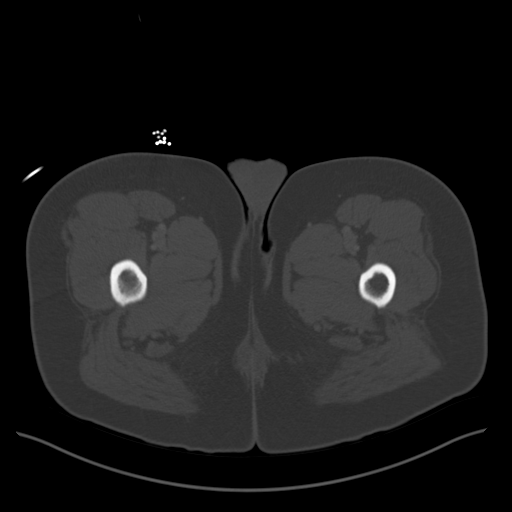
[im 12/105  soft-tissue]
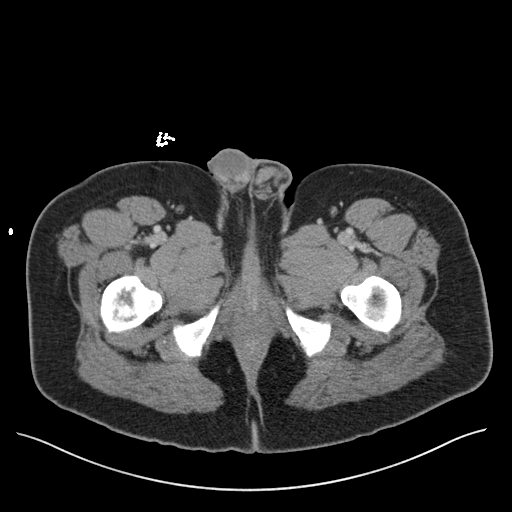
[im 24/105  soft-tissue]
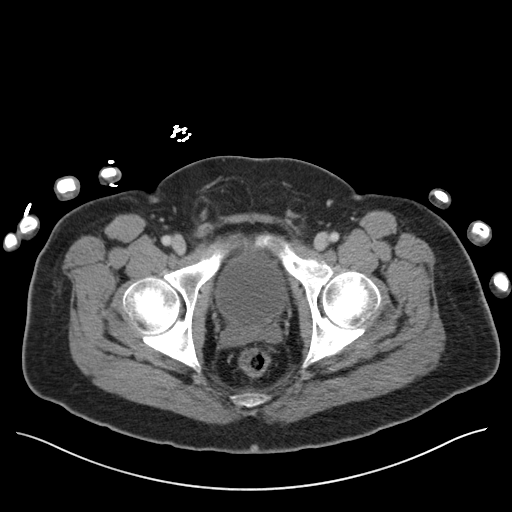
[im 29/105  soft-tissue]
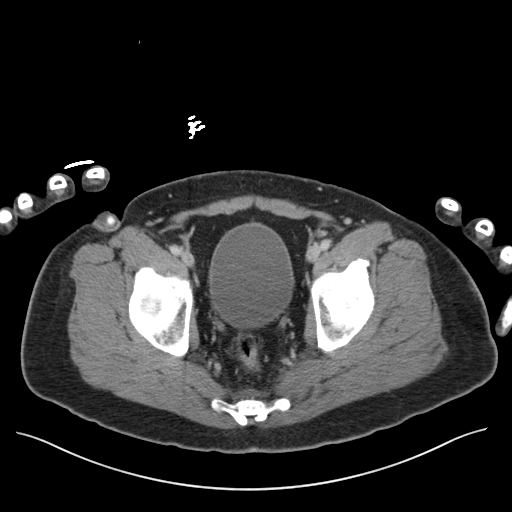
[im 35/105  soft-tissue]
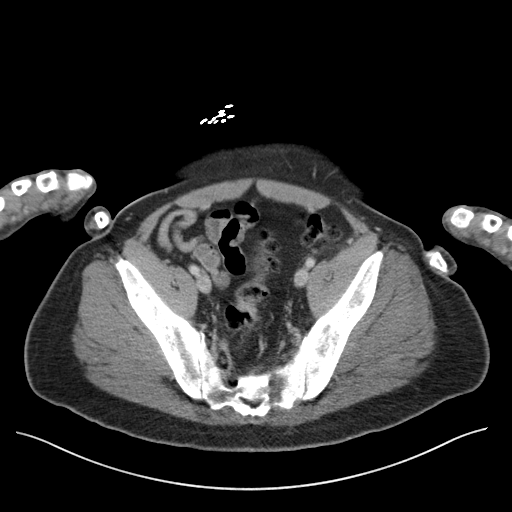
[im 47/105  soft-tissue]
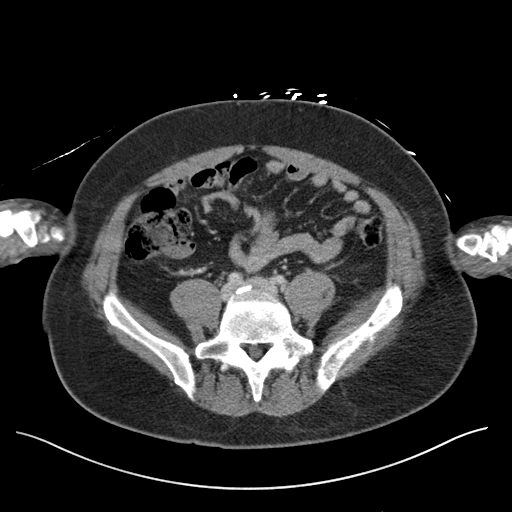
[im 53/105  soft-tissue]
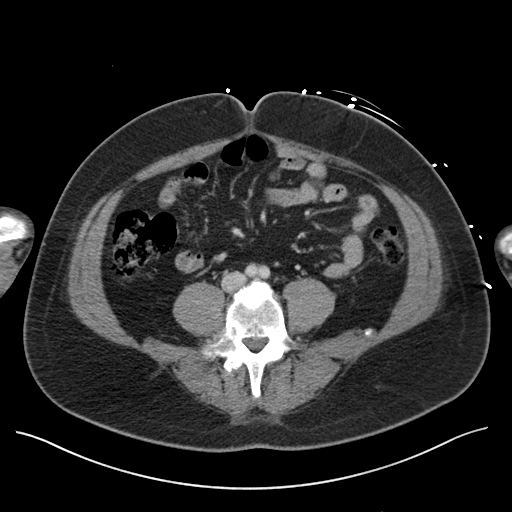
[im 58/105  soft-tissue]
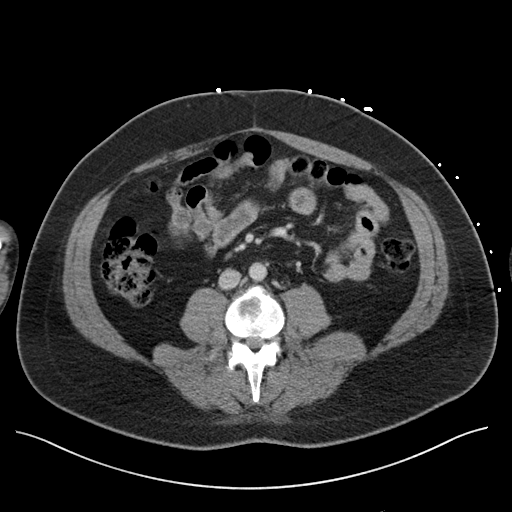
[im 70/105  soft-tissue]
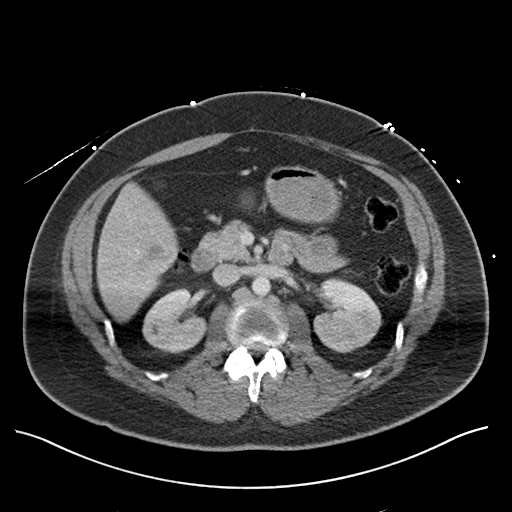
[im 70/105  bone]
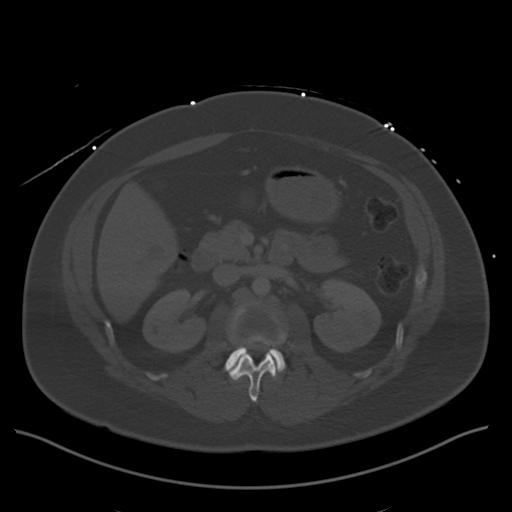
[im 76/105  soft-tissue]
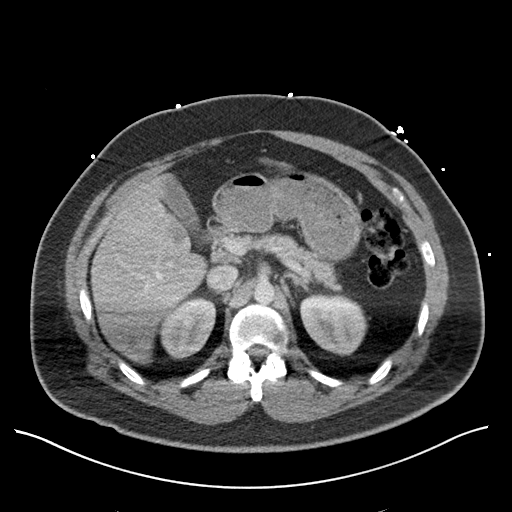
[im 81/105  soft-tissue]
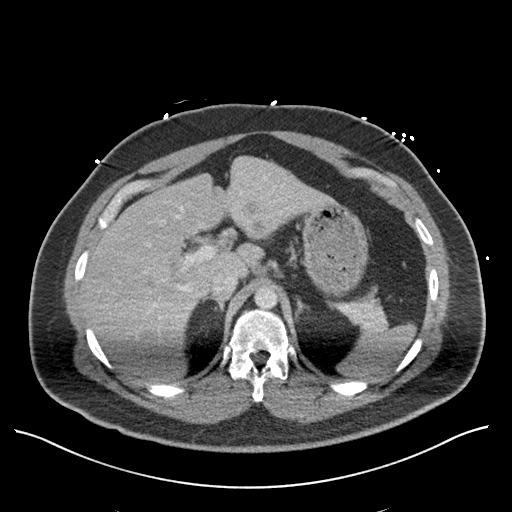
[im 93/105  soft-tissue]
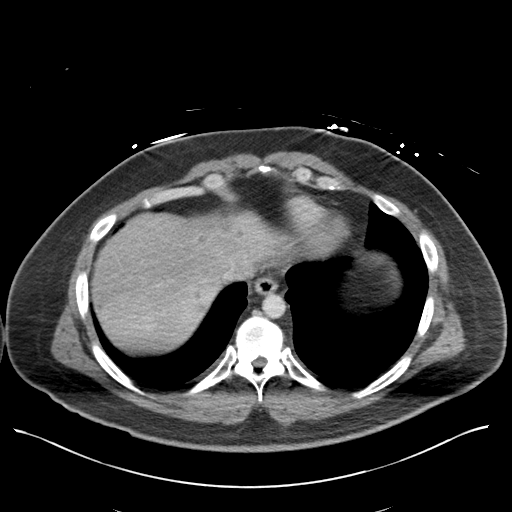
[im 99/105  soft-tissue]
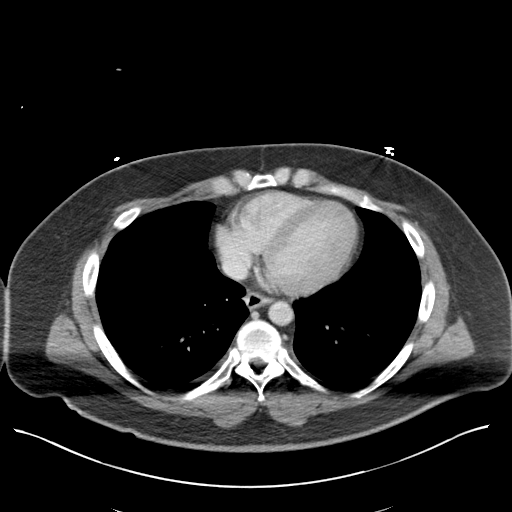

[Series 6: coronal st · coronal · 0.86mm/px · 3 of 98 slices shown]
[im 33/98  soft-tissue]
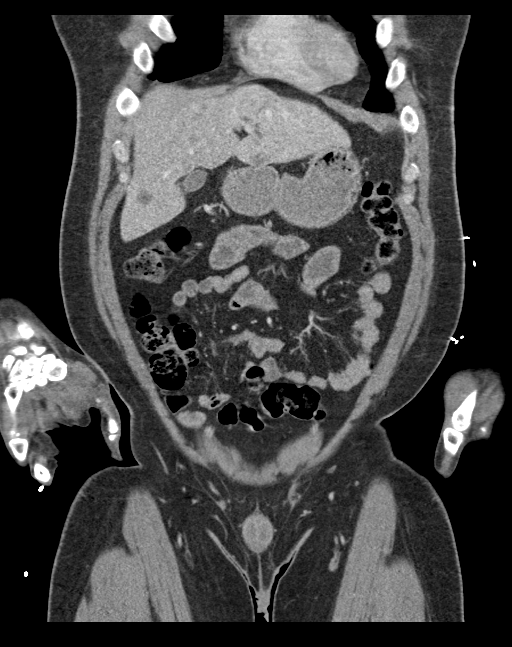
[im 44/98  soft-tissue]
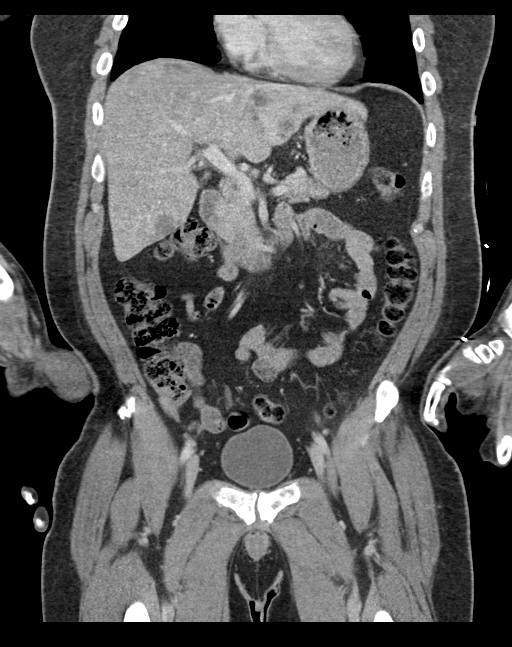
[im 54/98  soft-tissue]
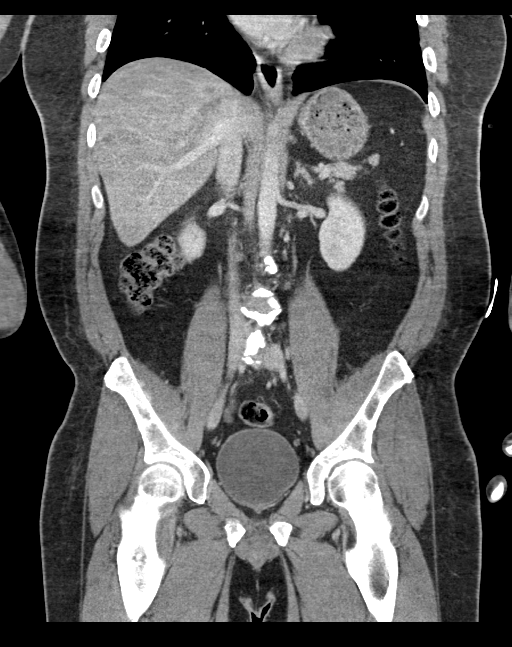

[16 of 46 positions shown; findings below may reference images not displayed]

FINDINGS: Lower chest: Please refer to corresponding chest CT report
describing right upper lobe malignancy, right hilar and mediastinal
adenopathy, and chronic right lower lobe pulmonary emboli.

Hepatobiliary: Innumerable hepatic metastases are again noted,
increased in size. Largest index lesion within the caudate measures
4.9 cm image 18, previously measuring 2.8 cm image 47.

No biliary dilation.  Gallbladder is unremarkable.

Pancreas: Unremarkable. No pancreatic ductal dilatation or
surrounding inflammatory changes.

Spleen: Normal in size without focal abnormality.

Adrenals/Urinary Tract: Adrenal glands are unremarkable. Kidneys are
normal, without renal calculi, focal lesion, or hydronephrosis.
Bladder is unremarkable.

Stomach/Bowel: No bowel obstruction or ileus. No bowel wall
thickening or inflammatory changes.

Vascular/Lymphatic: No pathologic adenopathy within the abdomen or
pelvis. Vascular structures are unremarkable.

Reproductive: Prostate is unremarkable.

Other: No free fluid or free gas.  No abdominal wall hernia.

Musculoskeletal: Progressive diffuse bony metastases are seen
throughout the bilateral hips, lumbar spine, sacrum, and bony
pelvis. Please refer to dedicated CT lumbar spine describing
findings related to the lumbar spine and sacrum. Increased size of
the expansile left iliac crest lesion with associated soft tissue
component. There are no pathologic fractures.
IMPRESSION: 1. Progressive liver metastases as above.
2. Progressive bony metastases involving the lumbar spine, bony
pelvis, and sacrum. Please refer to corresponding CT lumbar spine
report for important findings in that region.

## 2019-06-17 IMAGING — CT CT HEAD W/O CM
3 series · 14 of 47 positions shown, 16 images · non-contrast
Comparison: [DATE] CT scan of the brain. Brain MRI [DATE].

CLINICAL DATA: Known brain metastases. Worsening headache. Patient
recently started anticoagulation.

EXAM:
CT HEAD WITHOUT CONTRAST
CT CERVICAL SPINE WITHOUT CONTRAST
TECHNIQUE: Multidetector CT imaging of the head and cervical spine was
performed following the standard protocol without intravenous
contrast. Multiplanar CT image reconstructions of the cervical spine
were also generated.

[Series 3: head wo · axial · 0.47mm/px · z∈[-126,+9]mm · 8 of 33 slices shown, 10 images]
[im 3/33  brain]
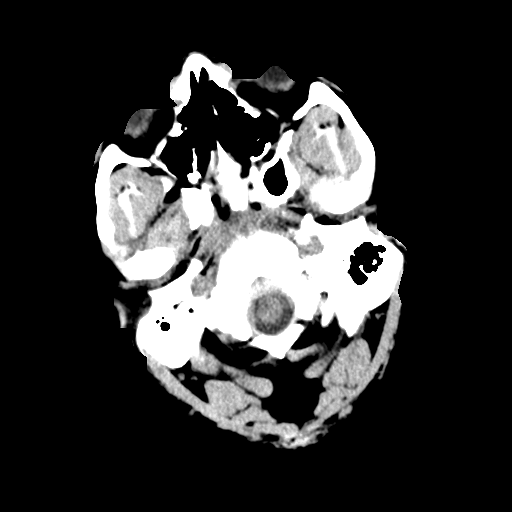
[im 3/33  bone]
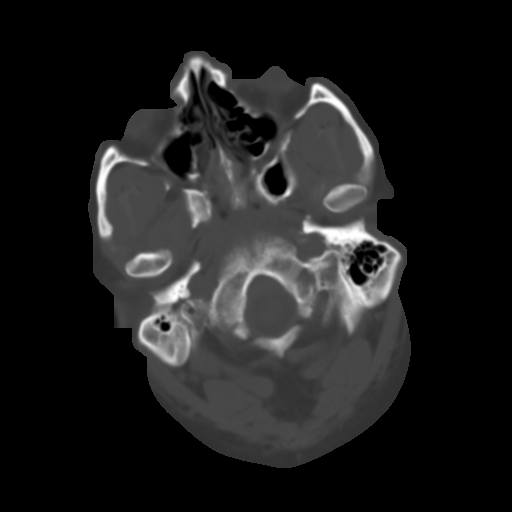
[im 7/33  brain]
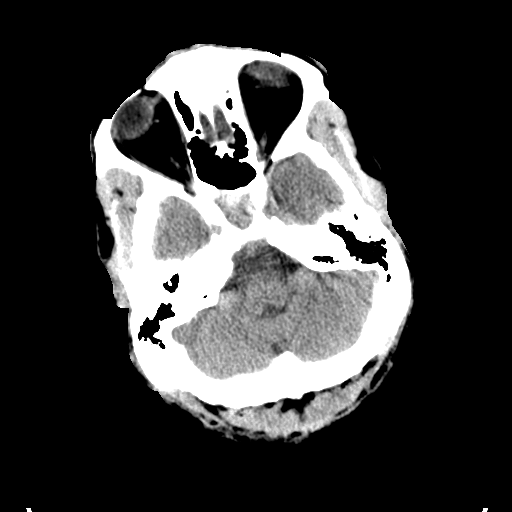
[im 10/33  brain]
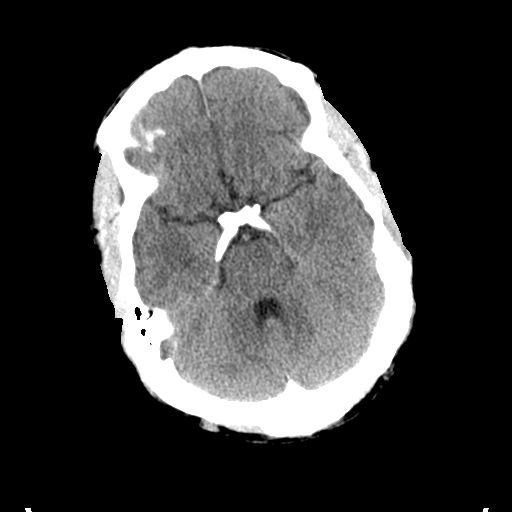
[im 15/33  brain]
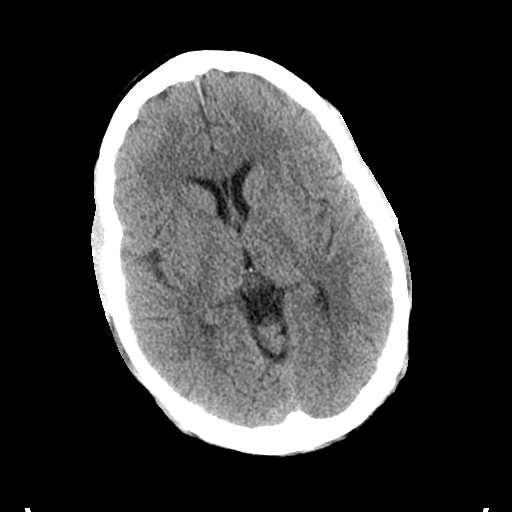
[im 18/33  brain]
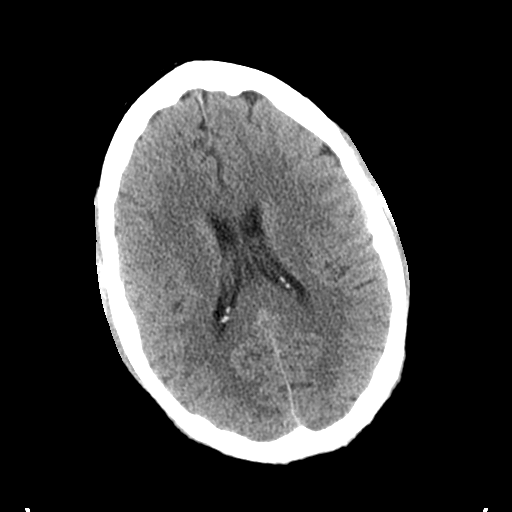
[im 18/33  bone]
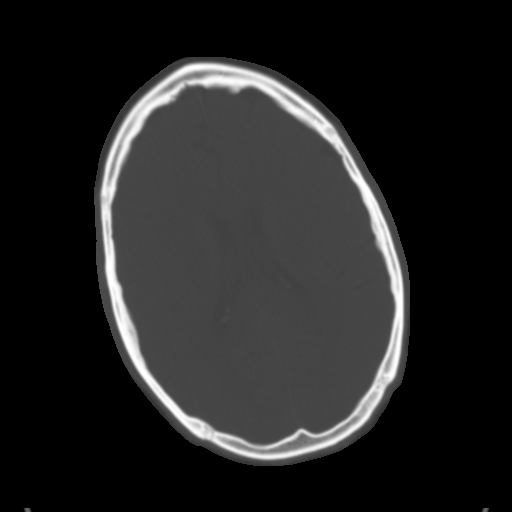
[im 23/33  brain]
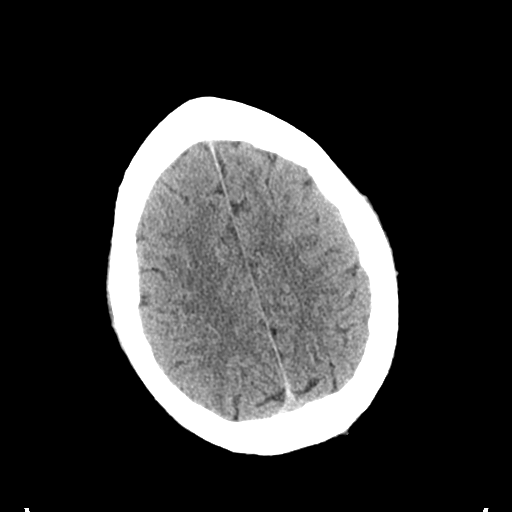
[im 26/33  brain]
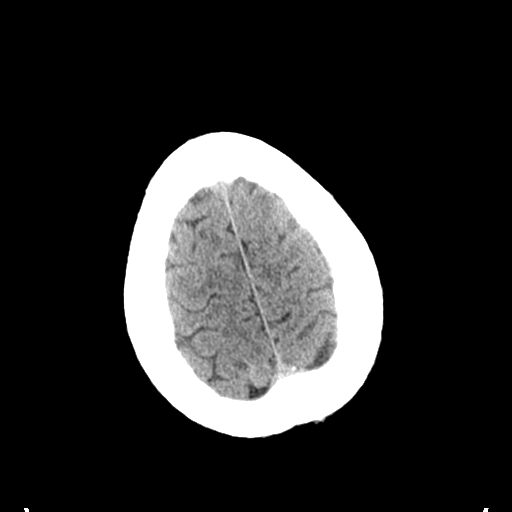
[im 30/33  brain]
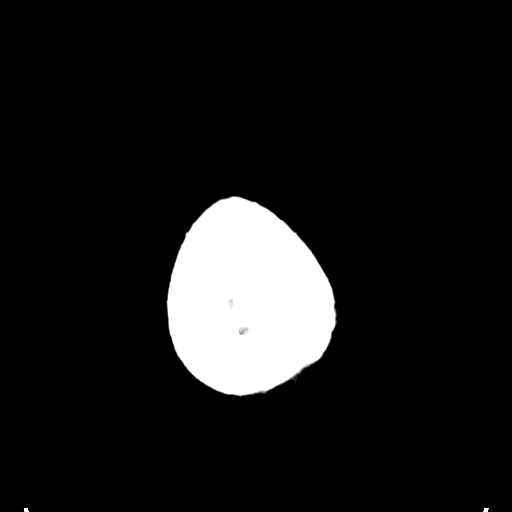

[Series 5: coronal soft tissue · coronal · 0.34mm/px · 3 of 67 slices shown]
[im 23/67  brain]
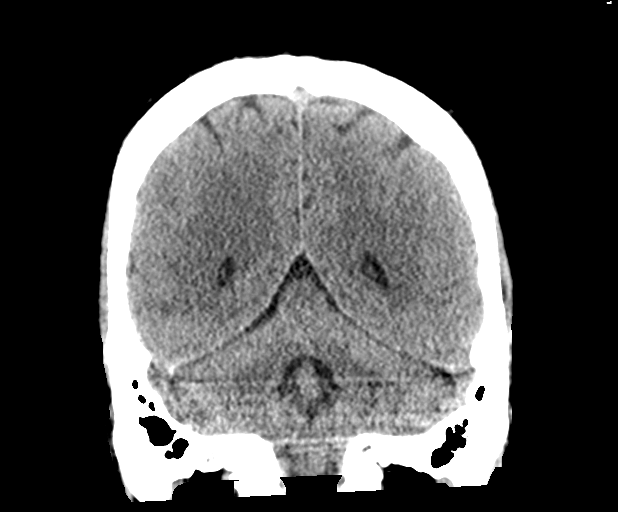
[im 30/67  brain]
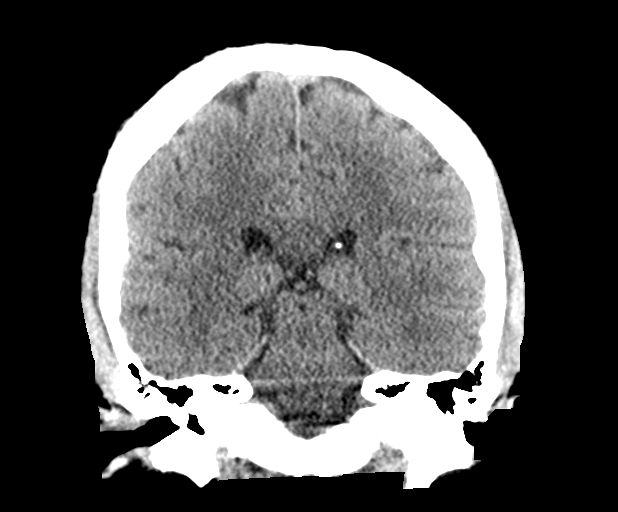
[im 37/67  brain]
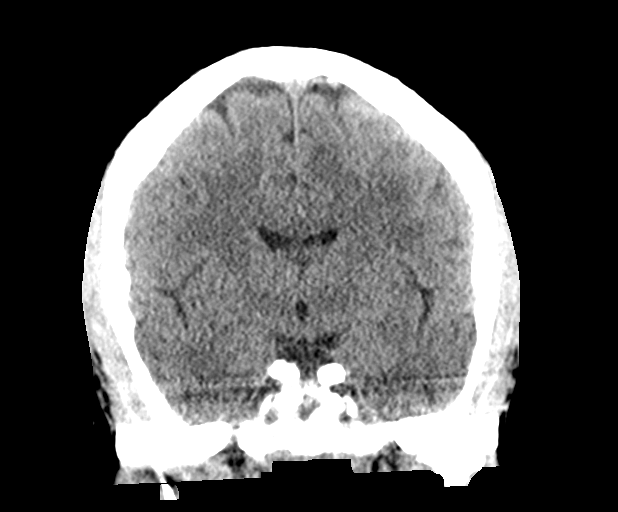

[Series 6: sagittal soft tissue · sagittal · 0.35mm/px · 3 of 53 slices shown]
[im 18/53  brain]
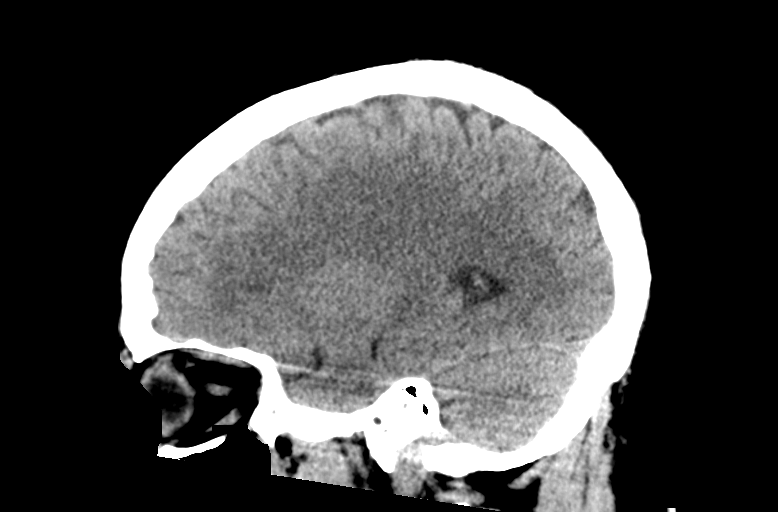
[im 27/53  brain]
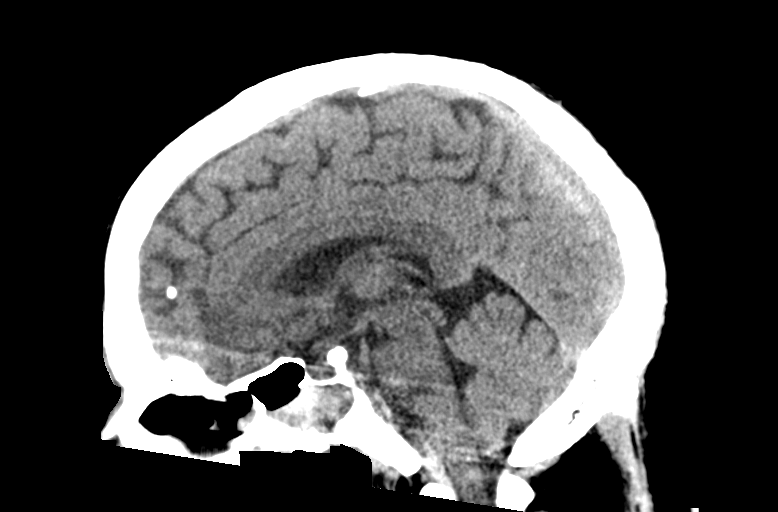
[im 35/53  brain]
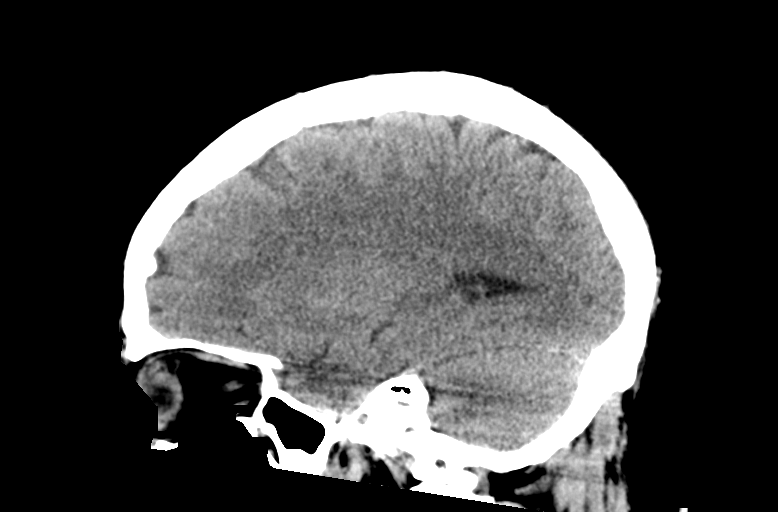

[14 of 47 positions shown; findings below may reference images not displayed]

FINDINGS: CT HEAD FINDINGS

Brain: No subdural, epidural, or subarachnoid hemorrhage. Most of
the patient's known brain metastases are not visualized on this
unenhanced CT scan. There is suggestion of a few metastases, very
subtle. No acute cortical ischemia or infarct. Cerebellum,
brainstem, and basal cisterns are unremarkable. No midline shift or
mass effect identified.

Vascular: No hyperdense vessel or unexpected calcification.

Skull: Suspected calvarial metastases seen on MRI are not well
appreciated on this study.

Sinuses/Orbits: Minimal fluid in the posterior right mastoid air
cells without bony erosion. There is opacification of the sphenoid
sinuses which is worsened in the interval.

Other: The patient is suspected left retinal metastasis likely
presents with slight thickening on today's CT scan, very subtle.

CT CERVICAL SPINE FINDINGS

Alignment: Normal.

Skull base and vertebrae: No fractures identified. Relative lucency
in the left lamina and pedicle at C2, extending into the spinous
process with periosteal reaction. No other discrete bony lesions in
the cervical spine. A lucency in the right mandibular head is
well-circumscribed.

Soft tissues and spinal canal: No prevertebral fluid or swelling. No
visible canal hematoma.

Disc levels:  Mild multilevel degenerative disc disease.

Upper chest: Only the superior most aspect of the right apical mass
is seen on this CT. Several small nodules in the left apex are
better assessed on today's CT scan of the chest.

Other: No other abnormalities.
IMPRESSION: 1. Most of the patient's known brain metastases are not seen on this
noncontrast CT of the brain. There is suggestion of some of the
metastases, very subtle. No midline shift. Basal cisterns are
patent.
2. The patient's known left retinal metastasis is extremely subtle
on this study.
3. Suspected calvarial metastases on the previous MRI are not well
seen on this study.
4. No fracture or malalignment in the cervical spine. Lucency in the
left C2 lamina and pedicle extending into the spinous process with
periosteal reaction suggest metastatic disease in this region. No
other definitive bony metastatic disease in the cervical spine.
5. There is a rounded lucency in the right mandibular head which is
nonspecific.
6. Nodularity in the lung apices. See the CT scan of the chest for
better evaluation of this region.

## 2019-06-17 IMAGING — CT CT ANGIO CHEST
2 of 6 series · 17 of 46 positions shown · IV contrast (omnipaque)
Comparison: [DATE]

CLINICAL DATA: Recently diagnosed metastatic lung cancer, worsening
shortness of breath, chest pain, dyspnea

EXAM:
CT ANGIOGRAPHY CHEST WITH CONTRAST
TECHNIQUE: Multidetector CT imaging of the chest was performed using the
standard protocol during bolus administration of intravenous
contrast. Multiplanar CT image reconstructions and MIPs were
obtained to evaluate the vascular anatomy.
CONTRAST:  100mL OMNIPAQUE IOHEXOL 350 MG/ML SOLN

[Series 3: thins · axial · 0.79mm/px · z∈[-337,-44]mm · 14 of 321 slices shown]
[im 14/321  lung]
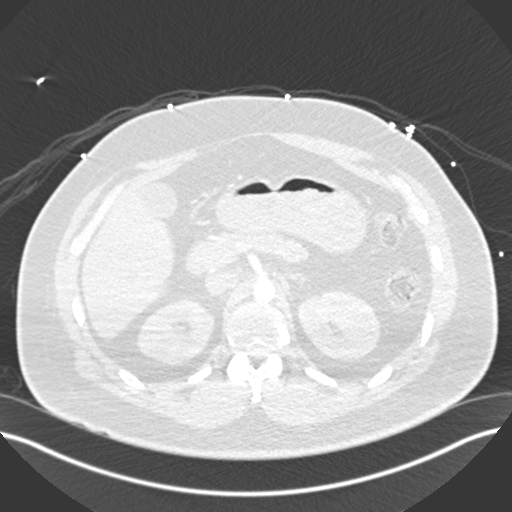
[im 42/321  soft-tissue]
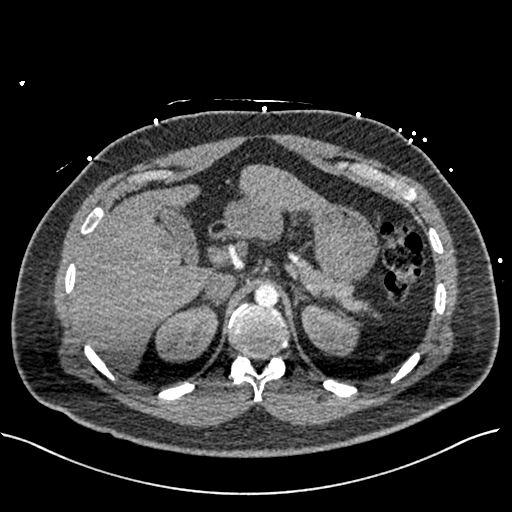
[im 56/321  lung]
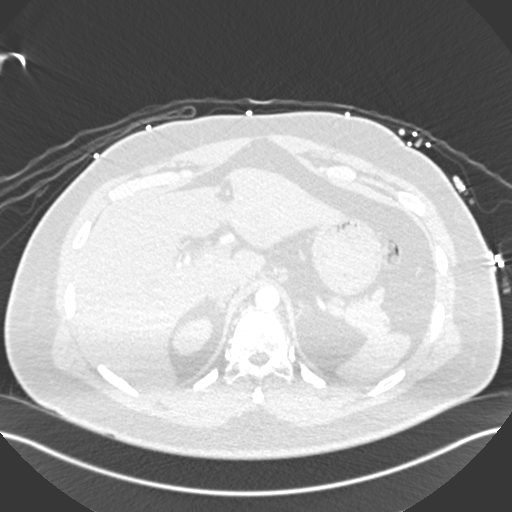
[im 84/321  soft-tissue]
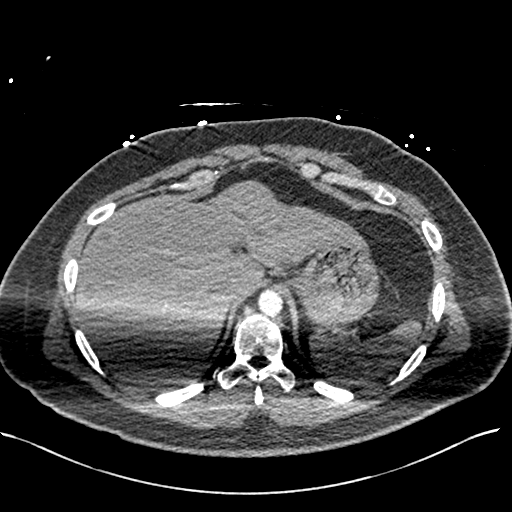
[im 112/321  lung]
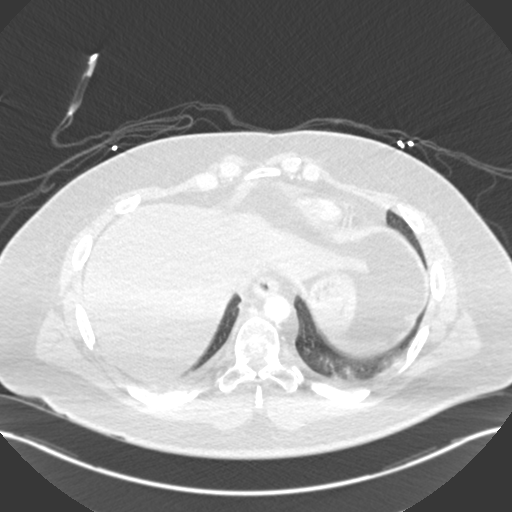
[im 126/321  soft-tissue]
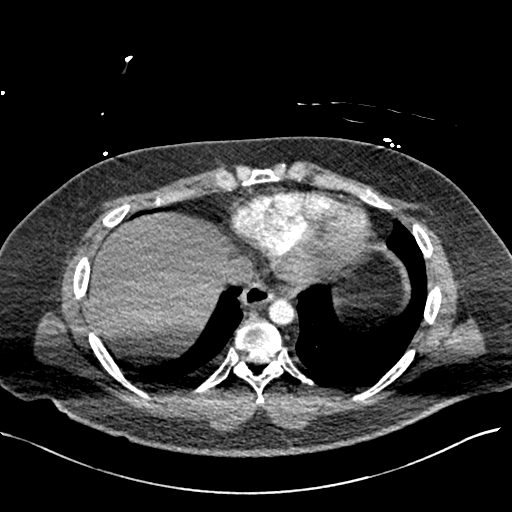
[im 154/321  lung]
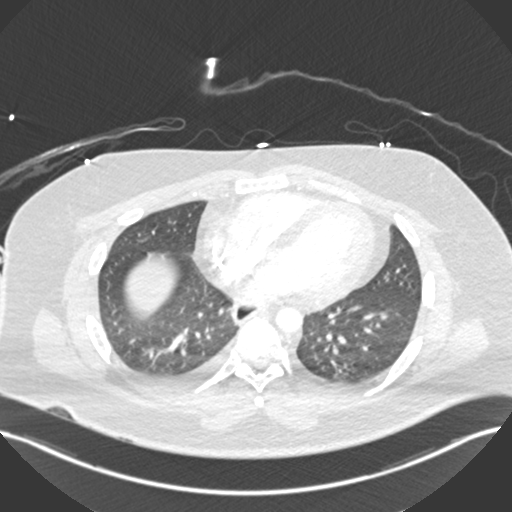
[im 167/321  soft-tissue]
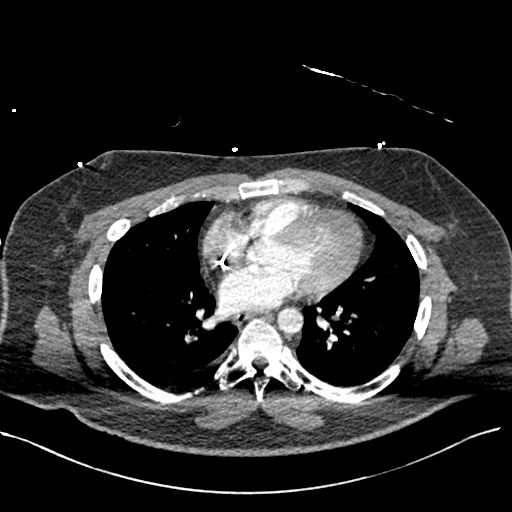
[im 195/321  lung]
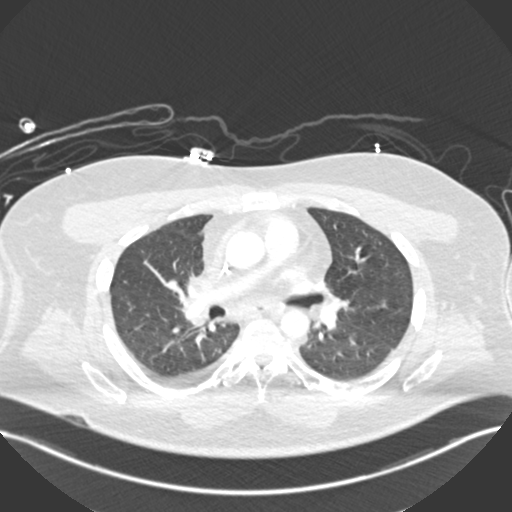
[im 209/321  soft-tissue]
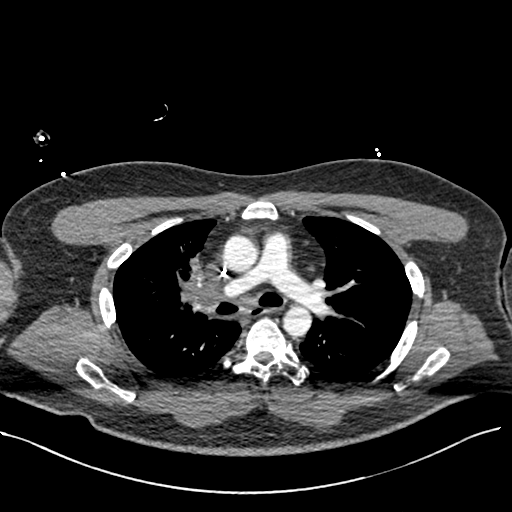
[im 237/321  lung]
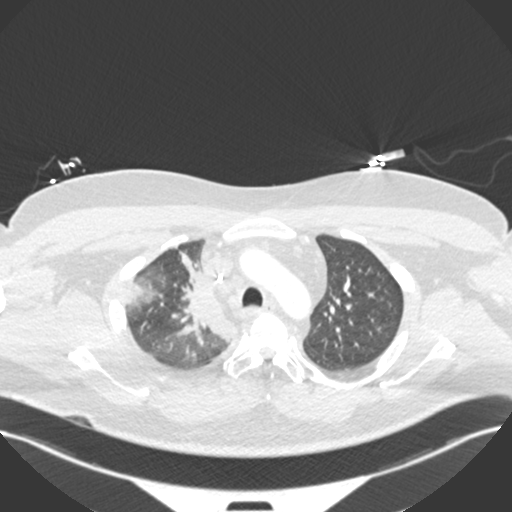
[im 265/321  soft-tissue]
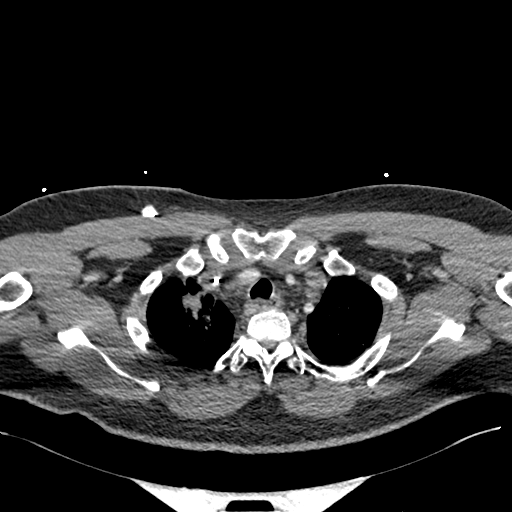
[im 279/321  lung]
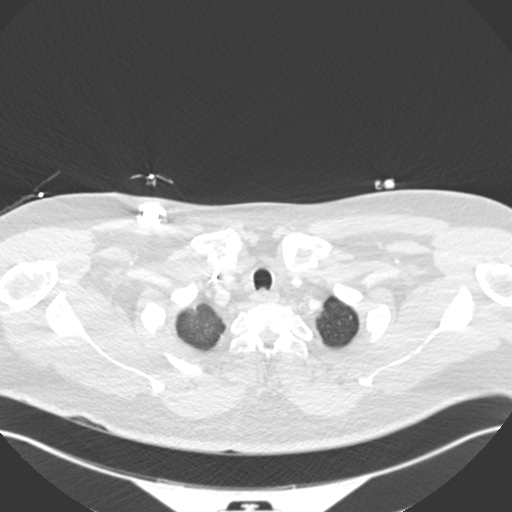
[im 307/321  soft-tissue]
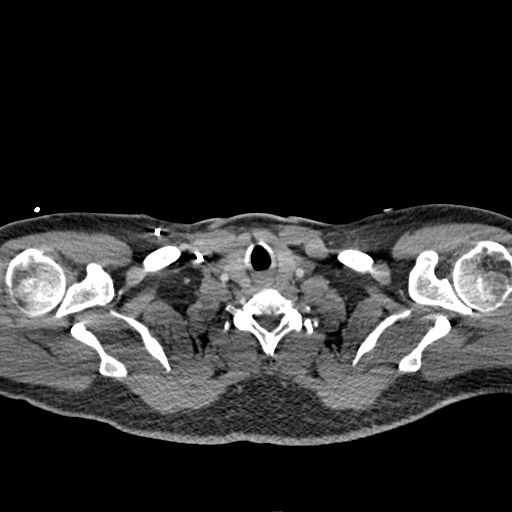

[Series 4: coronal mpr · coronal · 0.68mm/px · 3 of 137 slices shown]
[im 35/137  soft-tissue]
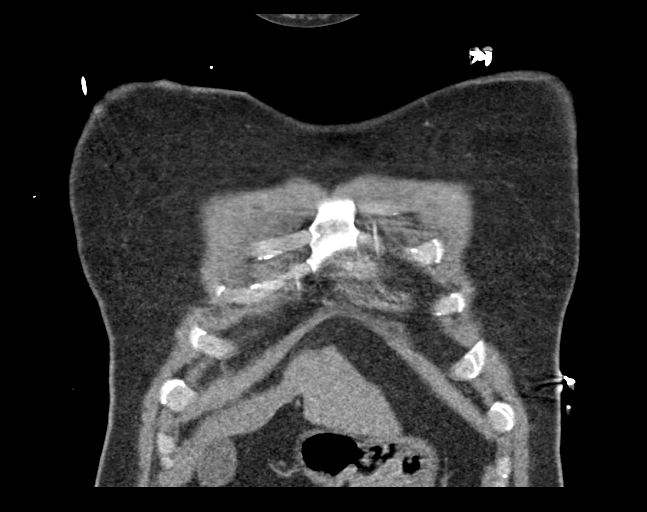
[im 69/137  soft-tissue]
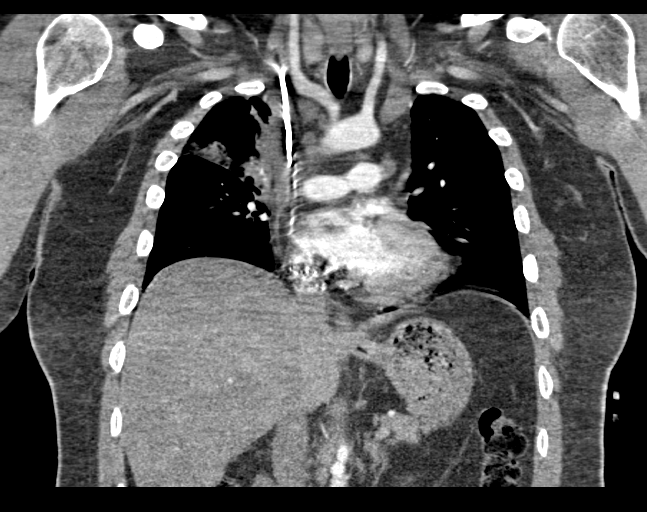
[im 103/137  soft-tissue]
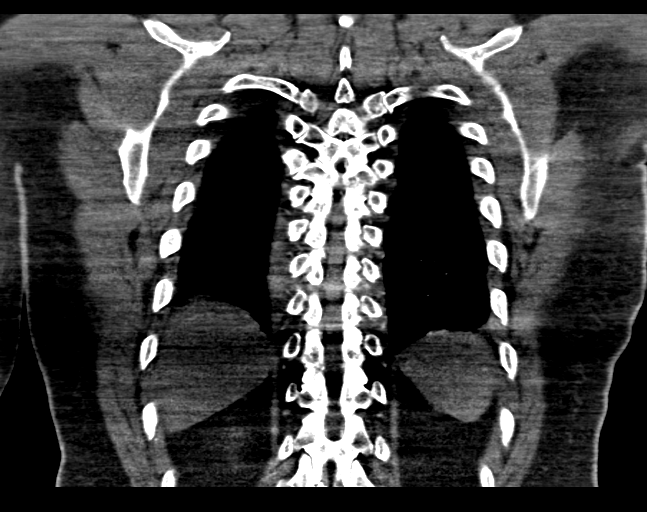

[17 of 46 positions shown; findings below may reference images not displayed]

FINDINGS: Cardiovascular: This is a technically adequate evaluation of the
pulmonary vasculature. The chronic right lower lobe pulmonary emboli
seen previously are again noted and unchanged. There are no acute
pulmonary emboli.

The right hilar mass results in extrinsic compression of the right
upper lobe pulmonary artery, unchanged since prior exam.

No pericardial effusion.  The thoracic aorta remains unremarkable.

Mediastinum/Nodes: Mediastinal and right hilar adenopathy unchanged
since prior exam. Index lymph node in the right paratracheal region
image 22 measures 11 mm in short axis, stable since previous study.
Right hilar mass measures 2.5 cm reference image 37, previously
measuring approximately 2.2 cm. No new adenopathy.

The thyroid, trachea, and esophagus are grossly normal.

Lungs/Pleura: Thick-walled cavitary right upper lobe mass is again
noted, measuring approximately 2.8 x 3.6 cm reference image 23,
previously measuring 3.4 x 2.2 cm by my measurement. Findings are
consistent with primary malignancy.

Progressive wedge-shaped consolidation within the right upper lobe
likely reflects pulmonary infarct given the extrinsic compression on
the right upper lobe pulmonary artery by the infiltrative right
hilar mass.

Subcentimeter nodule left upper lobe image 28 and superior segment
left lower lobe image 39 unchanged.

There are trace bilateral pleural effusions right greater than left.
No pneumothorax.

There is narrowing of the right upper lobe bronchus and segmental
airways due to the infiltrative right hilar mass described above,
similar to previous study.

Upper Abdomen: Numerous liver metastases are noted, please refer to
CT abdomen report.

Musculoskeletal: Mottled appearance of the thoracic spine consistent
with bony metastases, stable since prior study. Pathologic T3
fracture is seen with mild retropulsion resulting in at least 50%
narrowing of the central canal. There is likely an associated soft
tissue component. No other pathologic fractures.

Review of the MIP images confirms the above findings.
IMPRESSION: 1. Chronic right lower lobe segmental pulmonary emboli. No acute
pulmonary embolus.
2. Cavitating right upper lobe mass consistent with lung cancer.
Minimal increase in size.
3. Marked right hilar and mediastinal adenopathy as above, without
significant change. There is extrinsic compression upon the right
upper lobe pulmonary artery and narrowing of the right upper lobe
bronchus due to the right hilar mass, stable.
4. Progressive right upper lobe pulmonary infarct due to the
extrinsic compression on the right upper lobe pulmonary artery.
5. Diffuse bony metastases. Pathologic T3 fracture with resulting
central canal narrowing as above. Please refer to CT thoracic spine
report.
6. Trace bilateral pleural effusions.
7. Liver metastases.  Please refer to abdominal CT report.

## 2019-06-17 MED ORDER — METOPROLOL TARTRATE 25 MG PO TABS
12.5000 mg | ORAL_TABLET | Freq: Two times a day (BID) | ORAL | Status: DC
  Administered 2019-06-17 – 2019-06-26 (×18): 12.5 mg via ORAL
  Filled 2019-06-17 (×18): qty 1

## 2019-06-17 MED ORDER — SODIUM CHLORIDE (PF) 0.9 % IJ SOLN
INTRAMUSCULAR | Status: AC
  Filled 2019-06-17: qty 50

## 2019-06-17 MED ORDER — PANTOPRAZOLE SODIUM 40 MG PO TBEC
40.0000 mg | DELAYED_RELEASE_TABLET | Freq: Two times a day (BID) | ORAL | Status: DC
  Administered 2019-06-17 – 2019-06-28 (×22): 40 mg via ORAL
  Filled 2019-06-17 (×22): qty 1

## 2019-06-17 MED ORDER — POLYETHYLENE GLYCOL 3350 17 G PO PACK
17.0000 g | PACK | Freq: Every day | ORAL | Status: DC
  Administered 2019-06-17 – 2019-06-26 (×8): 17 g via ORAL
  Filled 2019-06-17 (×11): qty 1

## 2019-06-17 MED ORDER — HEPARIN (PORCINE) 25000 UT/250ML-% IV SOLN
800.0000 [IU]/h | INTRAVENOUS | Status: DC
  Administered 2019-06-17: 800 [IU]/h via INTRAVENOUS
  Filled 2019-06-17: qty 250

## 2019-06-17 MED ORDER — HYDROMORPHONE HCL 1 MG/ML IJ SOLN
0.5000 mg | Freq: Once | INTRAMUSCULAR | Status: AC
  Administered 2019-06-17: 0.5 mg via INTRAVENOUS
  Filled 2019-06-17: qty 1

## 2019-06-17 MED ORDER — HYDROMORPHONE HCL 1 MG/ML IJ SOLN
1.0000 mg | Freq: Once | INTRAMUSCULAR | Status: AC
  Administered 2019-06-17: 1 mg via INTRAVENOUS
  Filled 2019-06-17: qty 1

## 2019-06-17 MED ORDER — ONDANSETRON HCL 4 MG/2ML IJ SOLN
4.0000 mg | Freq: Four times a day (QID) | INTRAMUSCULAR | Status: DC | PRN
  Administered 2019-06-17 – 2019-06-26 (×10): 4 mg via INTRAVENOUS
  Filled 2019-06-17 (×10): qty 2

## 2019-06-17 MED ORDER — SODIUM CHLORIDE 0.9 % IV SOLN: INTRAVENOUS | Status: DC

## 2019-06-17 MED ORDER — LORAZEPAM 2 MG/ML IJ SOLN
1.0000 mg | Freq: Once | INTRAMUSCULAR | Status: AC | PRN
  Administered 2019-06-17: 1 mg via INTRAVENOUS
  Filled 2019-06-17: qty 1

## 2019-06-17 MED ORDER — RIVAROXABAN 20 MG PO TABS: 20.0000 mg | ORAL_TABLET | Freq: Every day | ORAL | Status: DC

## 2019-06-17 MED ORDER — SODIUM CHLORIDE 0.9 % IV BOLUS
500.0000 mL | Freq: Once | INTRAVENOUS | Status: AC
  Administered 2019-06-17: 500 mL via INTRAVENOUS

## 2019-06-17 MED ORDER — SODIUM CHLORIDE 0.9% FLUSH
3.0000 mL | Freq: Once | INTRAVENOUS | Status: AC
  Administered 2019-06-17: 3 mL via INTRAVENOUS

## 2019-06-17 MED ORDER — DEXAMETHASONE SODIUM PHOSPHATE 4 MG/ML IJ SOLN: 4.0000 mg | Freq: Four times a day (QID) | INTRAMUSCULAR | Status: DC

## 2019-06-17 MED ORDER — ALBUTEROL SULFATE (2.5 MG/3ML) 0.083% IN NEBU: 3.0000 mL | INHALATION_SOLUTION | Freq: Four times a day (QID) | RESPIRATORY_TRACT | Status: DC | PRN

## 2019-06-17 MED ORDER — SENNA 8.6 MG PO TABS
1.0000 | ORAL_TABLET | Freq: Two times a day (BID) | ORAL | Status: DC
  Administered 2019-06-17 – 2019-06-19 (×5): 8.6 mg via ORAL
  Filled 2019-06-17 (×5): qty 1

## 2019-06-17 MED ORDER — DEXAMETHASONE SODIUM PHOSPHATE 10 MG/ML IJ SOLN
6.0000 mg | Freq: Four times a day (QID) | INTRAMUSCULAR | Status: DC
  Administered 2019-06-17 – 2019-06-18 (×2): 6 mg via INTRAVENOUS
  Filled 2019-06-17 (×3): qty 1

## 2019-06-17 MED ORDER — HYDROMORPHONE HCL 1 MG/ML IJ SOLN
1.0000 mg | INTRAMUSCULAR | Status: DC | PRN
  Administered 2019-06-17 – 2019-06-21 (×25): 1 mg via INTRAVENOUS
  Filled 2019-06-17 (×25): qty 1

## 2019-06-17 MED ORDER — IOHEXOL 350 MG/ML SOLN
100.0000 mL | Freq: Once | INTRAVENOUS | Status: AC | PRN
  Administered 2019-06-17: 100 mL via INTRAVENOUS

## 2019-06-17 MED ORDER — RIVAROXABAN 15 MG PO TABS
15.0000 mg | ORAL_TABLET | Freq: Two times a day (BID) | ORAL | Status: DC
  Administered 2019-06-17 – 2019-06-18 (×2): 15 mg via ORAL
  Filled 2019-06-17 (×2): qty 1

## 2019-06-17 MED ORDER — ALUM & MAG HYDROXIDE-SIMETH 200-200-20 MG/5ML PO SUSP
30.0000 mL | Freq: Four times a day (QID) | ORAL | Status: DC | PRN
  Administered 2019-06-26: 30 mL via ORAL
  Filled 2019-06-17: qty 30

## 2019-06-17 MED ORDER — ONDANSETRON HCL 4 MG PO TABS: 4.0000 mg | ORAL_TABLET | Freq: Four times a day (QID) | ORAL | Status: DC | PRN

## 2019-06-17 MED ORDER — HYDROMORPHONE HCL 1 MG/ML IJ SOLN: 0.5000 mg | INTRAMUSCULAR | Status: DC | PRN

## 2019-06-17 MED ORDER — RIVAROXABAN (XARELTO) VTE STARTER PACK (15 & 20 MG): 15.0000 mg | ORAL_TABLET | Freq: Two times a day (BID) | ORAL | Status: DC

## 2019-06-17 NOTE — Progress Notes (Signed)
Patient ID: William Ali, male   DOB: 12/13/1985, 34 y.o.   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 He is already receiving radiotherapy to his spine in three fields. No mri of the cervical or thoracic spine was performed though changes on the ct were noted such as pathologic fractures. He was discharged from the hospital yesterday after being admitted for a pulmonary embolus. After being discharged home yesterday he found he was able to do very little with his lower extremities. DVT diagnosis made during a visit to the ED on 06/04/2019. He was treated with heparin and xarelto. xarelto held today, started on a heparin drip. Awaiting MRI neuraxis. I have spoken with Dr. Sondra Come of [place]. He too is waiting on the MRI so anatomic characterization of the metastatic disease can be obtained. It is not clear that operative decompression is indicated at this time.  He is moving his lower extremities at this time.  Complaining of pain in the chest.

## 2019-06-17 NOTE — Progress Notes (Signed)
  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 Whole brain 2. Upper Thoracic spine (T2-T5)  3. Lower thoracic spine (T8- T12) 4. Lumbosacral spine  (L5-S2) r Narrative . . . . . . . . The patient presented to ER earlier today. Symptoms concerning for cord compression. Awaiting results of MRI to determine course of treatment. Dr. Ashok Pall involved if surgery indicated.  If cord compression in the above treated areas then emergent radiation not indicated as he has progressed despite receiving 70% of planned radiation dose.                                      ________________________________   Blair Promise, PhD, MD

## 2019-06-17 NOTE — ED Triage Notes (Signed)
Pt reports was recently seen for PEs and released from hospital yesterday. Reports still having continued headache and chest pains that arent relieved with PO Dilaudid. Has lung cancer that has spread to brain.

## 2019-06-17 NOTE — Progress Notes (Signed)
Patient ID: William Ali, male   DOB: 1985-08-23, 34 y.o.   MRN: [id number] BP (!) 146/90 (BP Location: Left Arm)   Pulse (!) 108   Temp 99.2 F (37.3 C)   Resp 14   Ht 5\' 7"  (1.702 m)   Wt 99 kg   SpO2 99%   BMI 34.18 kg/m  Films are reviewed. Unfortunately his spine is riddled with tumor. There is no clear cord compromise needing emergent or urgent operative treatment. There are no real anchors for hardware given the tumor burden in the spine. Spinal fluid is visible at each level of the thoracic spine. Lumbar spine shows slight progression in the last month. Great deal of tumor in the sacrum on the right side. Again no real operative options available.

## 2019-06-17 NOTE — ED Provider Notes (Addendum)
[facility] Emergency Department Provider Note MRN:  [id number]  Arrival date & time: 06/17/19     Chief Complaint   Headache and Chest Pain   History of Present Illness   William Ali is a 34 y.o. year-old male with a history of metastatic lung cancer presenting to the ED with chief complaint of headache, chest pain.  Patient explains that he was discharged yesterday, was diagnosed with lung cancer and metastatic brain lesions 2 weeks ago.  Was in the hospital for a blood clot in the lungs, felt fairly well during discharge but started becoming much worse while at home.  Worsening dyspnea, right-sided thoracic back pain, worsening headache.  No abdominal pain.  Endorsing left leg weakness for the past 1 or 2 weeks.  Review of Systems  A complete 10 system review of systems was obtained and all systems are negative except as noted in the HPI and PMH.   [name] Health History    Past Medical History:  Diagnosis Date  . Back pain   . Cancer ([place])   . Hypertension     Past Surgical History:  Procedure Laterality Date  . IR IMAGING GUIDED PORT INSERTION  06/12/2019  . KNEE SURGERY      Family History  Problem Relation Age of Onset  . Hypertension Mother   . Diabetes Father     Social History   Socioeconomic History  . Marital status: Married    Spouse name: Not on file  . Number of children: Not on file  . Years of education: Not on file  . Highest education level: Not on file  Occupational History  . Not on file  Tobacco Use  . Smoking status: Never Smoker  . Smokeless tobacco: Never Used  Substance and Sexual Activity  . Alcohol use: No  . Drug use: No  . Sexual activity: Yes  Other Topics Concern  . Not on file  Social History Narrative  . Not on file   Social Determinants of Health   Financial Resource Strain:   . Difficulty of Paying Living Expenses:   Food Insecurity:   . Worried About Charity fundraiser in the Last Year:    . Arboriculturist in the Last Year:   Transportation Needs:   . Film/video editor (Medical):   Marland Kitchen Lack of Transportation (Non-Medical):   Physical Activity:   . Days of Exercise per Week:   . Minutes of Exercise per Session:   Stress:   . Feeling of Stress :   Social Connections:   . Frequency of Communication with Friends and Family:   . Frequency of Social Gatherings with Friends and Family:   . Attends Religious Services:   . Active Member of Clubs or Organizations:   . Attends Archivist Meetings:   Marland Kitchen Marital Status:   Intimate Partner Violence:   . Fear of Current or Ex-Partner:   . Emotionally Abused:   Marland Kitchen Physically Abused:   . Sexually Abused:      Physical Exam   Vitals:   06/17/19 1630 06/17/19 1645  BP: (!) 162/91 (!) 154/90  Pulse: (!) 106 96  Resp: (!) 25 (!) 24  Temp:    SpO2: 100% 99%    CONSTITUTIONAL: Ill-appearing, moderate to severe dyspnea NEURO:  Alert and oriented x 3, normal sensation, mild decreased strength to left leg EYES:  eyes equal and reactive ENT/NECK:  no LAD, no JVD CARDIO: Tachycardic rate, well-perfused,  normal S1 and S2 PULM:  CTAB no wheezing or rhonchi, 5 word dyspnea GI/GU:  normal bowel sounds, non-distended, non-tender MSK/SPINE:  No gross deformities, no edema SKIN:  no rash, atraumatic PSYCH:  Appropriate speech and behavior  *Additional and/or pertinent findings included in MDM below  Diagnostic and Interventional Summary    EKG Interpretation  Date/Time:  Saturday Jun 17 2019 14:17:11 EDT Ventricular Rate:  120 PR Interval:    QRS Duration: 74 QT Interval:  285 QTC Calculation: 403 R Axis:   6 Text Interpretation: Sinus tachycardia Probable left atrial enlargement Probable LVH with secondary repol abnrm Baseline wander in lead(s) V5 12 Lead; [place] Confirmed by Gerlene Fee [phone]) on 06/17/2019 2:40:34 PM      Labs Reviewed  BASIC METABOLIC PANEL - Abnormal; Notable for the following  components:      Result Value   Glucose, Bld 138 (*)    BUN 22 (*)    Calcium 8.7 (*)    All other components within normal limits  CBC - Abnormal; Notable for the following components:   RBC 3.98 (*)    Hemoglobin 11.0 (*)    HCT 34.6 (*)    RDW 16.7 (*)    Platelets 113 (*)    nRBC 0.3 (*)    All other components within normal limits  HEPARIN LEVEL (UNFRACTIONATED) - Abnormal; Notable for the following components:   Heparin Unfractionated 1.82 (*)    All other components within normal limits  SARS CORONAVIRUS 2 BY RT PCR (HOSPITAL ORDER, [facility])  APTT  TROPONIN I (HIGH SENSITIVITY)  TROPONIN I (HIGH SENSITIVITY)    CT HEAD WO CONTRAST  Final Result    CT ANGIO CHEST PE W OR WO CONTRAST  Final Result    CT CERVICAL SPINE WO CONTRAST  Final Result    CT ABDOMEN PELVIS W CONTRAST  Final Result    CT T-SPINE NO CHARGE  Final Result    CT L-SPINE NO CHARGE  Final Result    DG Chest 2 View  Final Result      Medications  sodium chloride flush (NS) 0.9 % injection 3 mL (has no administration in time range)  sodium chloride (PF) 0.9 % injection (has no administration in time range)  heparin ADULT infusion 100 units/mL (25000 units/212mL sodium chloride 0.45%) (800 Units/hr Intravenous New Bag/Given 06/17/19 1633)  HYDROmorphone (DILAUDID) injection 1 mg (has no administration in time range)  HYDROmorphone (DILAUDID) injection 0.5 mg (0.5 mg Intravenous Given 06/17/19 1456)  sodium chloride 0.9 % bolus 500 mL (0 mLs Intravenous Stopped 06/17/19 1634)  iohexol (OMNIPAQUE) 350 MG/ML injection 100 mL (100 mLs Intravenous Contrast Given 06/17/19 1532)     Procedures  /  Critical Care .Critical Care Performed by: Maudie Flakes, MD Authorized by: Maudie Flakes, MD   Critical care provider statement:    Critical care time (minutes):  45   Critical care was necessary to treat or prevent imminent or life-threatening deterioration of the  following conditions: Pulmonary infarct and spinal cord compression.   Critical care was time spent personally by me on the following activities:  Discussions with consultants, evaluation of patient's response to treatment, examination of patient, ordering and performing treatments and interventions, ordering and review of laboratory studies, ordering and review of radiographic studies, pulse oximetry, re-evaluation of patient's condition, obtaining history from patient or surrogate and review of old charts    ED Course and Medical Decision Making  I  have reviewed the triage vital signs, the nursing notes, and pertinent available records from the EMR.  Listed above are laboratory and imaging tests that I personally ordered, reviewed, and interpreted and then considered in my medical decision making (see below for details).      Concern for progression of known pulmonary embolism as patient is tachypneic, dyspneic, tachycardic, having new thoracic back pain.  Also with worsening headache and given his known metastatic lesions and new anticoagulation, there is concern for intracranial bleeding or progression of tumor.  Patient was prescribed Xarelto and was supposed to take first dose this morning but did not.  Will empirically start heparin while here in the hospital, anticipating admission as he is breathing 30-40 times per minute.  Patient also with left leg weakness for 1 or 2 weeks.  This could be related to the brain mets, but will image his spine today as well.  Imaging reveals progressive pulmonary infarct likely as the cause of patient's pain, dyspnea.  There is also pathologic fractures of the spine likely with cord compression in the thoracic spine.  Will admit to hospitalist service, will consult neurosurgery for imaging and/or procedural recommendations.  Barth Kirks. Sedonia Small, MD [place] [email or web address]  Final Clinical Impressions(s) /  ED Diagnoses     ICD-10-CM   1. Chest pain, unspecified type  R07.9   2. Low back pain with right-sided sciatica, unspecified back pain laterality, unspecified chronicity  M54.41   3. Malignancy ([place])  C80.1 CT T-SPINE NO CHARGE    CT T-SPINE NO CHARGE  4. Back pain  M54.9 CT L-SPINE NO CHARGE    CT L-SPINE NO CHARGE  5. 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 Metastatic malignant neoplasm, unspecified site ([place])  C79.9   7. Pathological fracture of vertebra due to neoplastic disease, initial encounter  M84.58XA   8. 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

## 2019-06-17 NOTE — Progress Notes (Addendum)
MRI's reviewed. Borderline to mild spinal cord compression at multiple levels (T3, T5, T6, T7, T9) which are in and outside current radiation fields. However no spinal cord signal abnormality. No indication for emergent radiation therapy. Plan on resuming his 4 radiation fields on Monday with possible  Modification to include T5-7 area. T3 and T9 area already covered in current radiation fields. Dr. Christella Noa to determine if surgery indicated.

## 2019-06-17 NOTE — ED Notes (Signed)
Hospitalist bedside, will transport patient as soon as possible.

## 2019-06-17 NOTE — H&P (Signed)
History and Physical    William Ali:[id number] DOB: 13-May-1985 DOA: 06/17/2019  PCP: Patient, No Pcp Per   Patient coming from: Home    Chief Complaint: Headache Back pain, abdominal pain  HPI: William Ali is a 34 y.o. male very unfortunate young man with medical history significant of metastatic lung cancer who presents to the emergency department with complaints of headache,chest pain, back pain, abdominal pain.  He was just discharged from to home yesterday.  As per the patient and his wife at the bedside, he did not feel well since being discharged.  He was bothered by pain in upper back and chest.  He was also having abdominal discomfort and severe headache.  Patient was very wobbly on walking.  He said his left-sided weakness has worsened and he feels numbness on his left lower extremity.  They were very concerned and he was brought to the emergency department today. Patient seen and examined the bedside this afternoon.  Currently he is hemodynamically stable.  He complains of severe weakness and pain as above.  Wife was present at the bedside.  There is no report of fever, chills, nausea, vomiting, dysuria or shortness of breath.  Patient has a follow-up appointment with his oncologist, Dr. Lorenso Courier, on coming week. 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  He has been currently treated with radiation therapy and is being planned for starting on chemotherapy soon by his oncologist.  He was diagnosed with DVT on 06/04/2019 and was diagnosed with PE on his last admission.  Pulmonary team was also consulted during last admission and he was treated with heparin drip for his PE and was discharged on Xarelto.  He also recently had a Chemo-Port placement on 06/12/2019 for starting chemotherapy.   ED Course: On presentation, he was in sinus tachycardia, quite hypertensive most likely secondary to pain.  He was afebrile and  was saturating fine on room air.  Chest x-ray showed increasing fullness in the right hilar and suprahilar region with volume loss in the upper lobe on comparison to the last chest x-ray.  CT imagings showed  T-spine showed diffuse bony metastases throughout the entirety of the thoracic spine, most pronounced at T3, T8, and T9 with resulting central canal narrowing and likely cord compression,pathologic fracture of the T3 vertebral body, with 50% loss of height centrally.  Diffuse bony metastasis throughout the L-spine, pelvis, sacrum.  Diffuse hepatic metastasis.  CT head/cervical spine showed diffuse brain metastasis which is not a new finding, left retinal  metastasis.CT angiogram of the chest showed chronic right lower lobe segmental pulmonary emboli,no acute pulmonary embolus,cavitating right upper lobe mass consistent with lung cancer. Patient was admitted for pain management, supportive care and possibly starting urgent radiation therapy for his cord compression on the T-spine.  He has been started on Decadron.  I have also requested palliative care consultation.  Oncology and radiation oncology consulted.   Review of Systems: As per HPI otherwise 10 point review of systems negative.    Past Medical History:  Diagnosis Date  . Back pain   . Cancer ([place])   . Hypertension     Past Surgical History:  Procedure Laterality Date  . IR IMAGING GUIDED PORT INSERTION  06/12/2019  . KNEE SURGERY       reports that he has never smoked. He has never used smokeless tobacco. He reports that he does not drink alcohol or use  drugs.  Allergies  Allergen Reactions  . Onion Anaphylaxis  . Other     All sea food causes swelling and a rash.    . Peanut Oil Itching  . Peanut-Containing Drug Products Itching    Family History  Problem Relation Age of Onset  . Hypertension Mother   . Diabetes Father      Prior to Admission medications   Medication Sig Start Date End Date Taking? Authorizing  Provider  albuterol (VENTOLIN HFA) 108 (90 Base) MCG/ACT inhaler Inhale 2 puffs into the lungs every 6 (six) hours as needed for wheezing or shortness of breath. 05/19/19  Yes Orson Slick, MD  alum & mag hydroxide-simeth (MAALOX/MYLANTA) 200-200-20 MG/5ML suspension Take 30 mLs by mouth every 6 (six) hours as needed for indigestion or heartburn. 06/16/19  Yes Antonieta Pert, MD  chlorpheniramine-HYDROcodone (TUSSIONEX) 10-8 MG/5ML SUER Take 5 mLs by mouth every 12 (twelve) hours as needed for up to 7 days for cough. 06/16/19 06/23/19 Yes Antonieta Pert, MD  dexamethasone (DECADRON) 4 MG tablet Take 1 tablet (4 mg total) by mouth 2 (two) times daily with a meal. Starting on 5/11, and drop dose to once daily on 5/14 06/06/19  Yes Hayden Pedro, PA-C  HYDROmorphone (DILAUDID) 2 MG tablet 1 to 2 tablets q 4 hours prn pain. Patient taking differently: Take 2-4 mg by mouth every 4 (four) hours as needed for moderate pain.  06/09/19  Yes Tanner, Lyndon Code., PA-C  metoprolol tartrate (LOPRESSOR) 25 MG tablet Take 0.5 tablets (12.5 mg total) by mouth 2 (two) times daily. 06/16/19 07/16/19 Yes Antonieta Pert, MD  pantoprazole (PROTONIX) 40 MG tablet Take 1 tablet (40 mg total) by mouth 2 (two) times daily. 06/16/19 07/16/19 Yes Antonieta Pert, MD  RIVAROXABAN Alveda Reasons) VTE STARTER PACK (15 & 20 MG TABLETS) Follow package directions: Take one 15mg  tablet by mouth twice a day. On day 22, switch to one 20mg  tablet once a day. Take with food. 06/04/19  Yes Dorie Rank, MD  amLODipine (NORVASC) 5 MG tablet Take 1 tablet (5 mg total) by mouth daily. 04/21/18 07/21/18  Robyn Haber, MD  famotidine (PEPCID) 20 MG tablet Take 1 tablet (20 mg total) by mouth 2 (two) times daily. Patient taking differently: Take 20 mg by mouth 2 (two) times daily as needed for heartburn.  04/21/18 07/21/18  Robyn Haber, MD    Physical Exam: Vitals:   06/17/19 1630 06/17/19 1645 06/17/19 1700 06/17/19 1715  BP: (!) 162/91 (!) 154/90 (!) 144/86 (!)  151/90  Pulse: (!) 106 96 95 94  Resp: (!) 25 (!) 24 (!) 30 (!) 24  Temp:      TempSrc:      SpO2: 100% 99% 100% 100%    Constitutional: In moderate distress due to pain, obese Vitals:   06/17/19 1630 06/17/19 1645 06/17/19 1700 06/17/19 1715  BP: (!) 162/91 (!) 154/90 (!) 144/86 (!) 151/90  Pulse: (!) 106 96 95 94  Resp: (!) 25 (!) 24 (!) 30 (!) 24  Temp:      TempSrc:      SpO2: 100% 99% 100% 100%   Eyes: Both pupils are reactive to light, left vision is blurry due to retinal metastasis  ENMT: Mucous membranes are moist.  Neck: normal, supple, no masses, no thyromegaly Respiratory: clear to auscultation bilaterally, no wheezing, no crackles. Normal respiratory effort. No accessory muscle use.  Cardiovascular: Sinus tachycardia, no murmurs / rubs / gallops. No extremity edema.  Abdomen: Diffuse abdominal  mild  tenderness, no masses palpated. Bowel sounds positive.  Musculoskeletal: no clubbing / cyanosis. No joint deformity upper and lower extremities.  Skin: tattos,no rashes, lesions, ulcers. No induration Neurologic: CN 2-12 grossly intact.  Generalized weakness on the bilateral lower extremities, left lower extremity is more weak than the right Psychiatric: Normal judgment and insight. Alert and oriented x 3.   Foley Catheter:None  Labs on Admission: I have personally reviewed following labs and imaging studies  CBC: Recent Labs  Lab 06/13/19 1153 06/14/19 0250 06/15/19 0329 06/16/19 0355 06/17/19 1418  WBC 5.5 4.7 4.2 5.8 6.6  NEUTROABS 4.5  --   --   --   --   HGB 12.8* 11.7* 10.1* 10.4* 11.0*  HCT 39.9 36.7* 32.1* 32.9* 34.6*  MCV 84.5 85.9 87.0 87.0 86.9  PLT 98* 89* 93* 97* 742*   Basic Metabolic Panel: Recent Labs  Lab 06/13/19 1153 06/14/19 0250 06/16/19 0355 06/17/19 1418  NA 139 141 137 139  K 3.8 4.4 4.3 3.6  CL 102 106 105 102  CO2 24 24 25 25   GLUCOSE 110* 112* 160* 138*  BUN 35* 28* 22* 22*  CREATININE 1.34* 1.34* 1.10 1.22  CALCIUM 9.2  9.1 8.6* 8.7*   GFR: Estimated Creatinine Clearance: 93 mL/min (by C-G formula based on SCr of 1.22 mg/dL). Liver Function Tests: Recent Labs  Lab 06/13/19 1153 06/14/19 0250  AST 62* 49*  ALT 153* 130*  ALKPHOS 374* 322*  BILITOT 1.0 0.9  PROT 7.0 6.2*  ALBUMIN 3.6 3.1*   Recent Labs  Lab 06/13/19 1153  LIPASE 28   No results for input(s): AMMONIA in the last 168 hours. Coagulation Profile: Recent Labs  Lab 06/13/19 1153  INR 1.4*   Cardiac Enzymes: No results for input(s): CKTOTAL, CKMB, CKMBINDEX, TROPONINI in the last 168 hours. BNP (last 3 results) No results for input(s): PROBNP in the last 8760 hours. HbA1C: No results for input(s): HGBA1C in the last 72 hours. CBG: No results for input(s): GLUCAP in the last 168 hours. Lipid Profile: No results for input(s): CHOL, HDL, LDLCALC, TRIG, CHOLHDL, LDLDIRECT in the last 72 hours. Thyroid Function Tests: No results for input(s): TSH, T4TOTAL, FREET4, T3FREE, THYROIDAB in the last 72 hours. Anemia Panel: No results for input(s): VITAMINB12, FOLATE, FERRITIN, TIBC, IRON, RETICCTPCT in the last 72 hours. Urine analysis:    Component Value Date/Time   COLORURINE YELLOW 06/02/2019 1838   APPEARANCEUR CLEAR 06/02/2019 1838   LABSPEC 1.016 06/02/2019 1838   PHURINE 5.0 06/02/2019 1838   GLUCOSEU NEGATIVE 06/02/2019 1838   HGBUR LARGE (A) 06/02/2019 1838   BILIRUBINUR NEGATIVE 06/02/2019 1838   KETONESUR NEGATIVE 06/02/2019 1838   PROTEINUR NEGATIVE 06/02/2019 1838   UROBILINOGEN 1.0 05/02/2014 1253   NITRITE NEGATIVE 06/02/2019 1838   LEUKOCYTESUR NEGATIVE 06/02/2019 1838    Radiological Exams on Admission: DG Chest 2 View  Result Date: 06/17/2019 CLINICAL DATA:  Chest pain, history of prior PE EXAM: CHEST - 2 VIEW COMPARISON:  06/13/2019 FINDINGS: Cardiac shadow is within normal limits. Left lung is clear. Fullness in the right hilum and suprahilar region is noted with volume loss in the upper lobe consistent  with the known mass lesion. Right chest wall port is seen in satisfactory position. IMPRESSION: Increasing fullness in the right hilar and suprahilar region with volume loss in the upper lobe slightly progressed when compared with the prior exam. Electronically Signed   By: Inez Catalina M.D.   On: 06/17/2019 14:58   CT  HEAD WO CONTRAST  Result Date: 06/17/2019 CLINICAL DATA:  Known brain metastases. Worsening headache. Patient recently started anticoagulation. EXAM: CT HEAD WITHOUT CONTRAST CT CERVICAL SPINE WITHOUT CONTRAST TECHNIQUE: Multidetector CT imaging of the head and cervical spine was performed following the standard protocol without intravenous contrast. Multiplanar CT image reconstructions of the cervical spine were also generated. COMPARISON:  Jun 04, 2019 CT scan of the brain. Brain MRI May 10, 2019. FINDINGS: CT HEAD FINDINGS Brain: No subdural, epidural, or subarachnoid hemorrhage. Most of the patient's known brain metastases are not visualized on this unenhanced CT scan. There is suggestion of a few metastases, very subtle. No acute cortical ischemia or infarct. Cerebellum, brainstem, and basal cisterns are unremarkable. No midline shift or mass effect identified. Vascular: No hyperdense vessel or unexpected calcification. Skull: Suspected calvarial metastases seen on MRI are not well appreciated on this study. Sinuses/Orbits: Minimal fluid in the posterior right mastoid air cells without bony erosion. There is opacification of the sphenoid sinuses which is worsened in the interval. 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 CT CERVICAL SPINE FINDINGS Alignment: Normal. Skull base and vertebrae: No fractures identified. Relative lucency in the left lamina and pedicle at C2, extending into the spinous process with periosteal reaction. No other discrete bony lesions in the cervical spine. A lucency in the right  mandibular head is well-circumscribed. Soft tissues and spinal canal: No prevertebral fluid or swelling. No visible canal hematoma. Disc levels:  Mild multilevel degenerative disc disease. Upper chest: Only the superior most aspect of the right apical mass is seen on this CT. Several small nodules in the left apex are better assessed on today's CT scan of the chest. Other: No other abnormalities. IMPRESSION: 1. 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 There is suggestion of some of the metastases, very subtle. No midline shift. Basal cisterns are patent. 2. 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 3. Suspected calvarial metastases on the previous MRI are not well seen on this study. 4. No fracture or malalignment in the cervical spine. Lucency in the left C2 lamina and pedicle extending into the spinous process with periosteal reaction suggest metastatic disease in this region. No other definitive bony metastatic disease in the cervical spine. 5. There is a rounded lucency in the right mandibular head which is nonspecific. 6. Nodularity in the lung apices. See the CT scan of the chest for better evaluation of this region. Electronically Signed   By: Dorise Bullion III M.D   On: 06/17/2019 16:37   CT ANGIO CHEST PE W OR WO CONTRAST  Result Date: 06/17/2019 CLINICAL DATA:  Recently diagnosed metastatic lung cancer, worsening shortness of breath, chest pain, dyspnea EXAM: CT ANGIOGRAPHY CHEST WITH CONTRAST TECHNIQUE: Multidetector CT imaging of the chest was performed using the standard protocol during bolus administration of intravenous contrast. Multiplanar CT image reconstructions and MIPs were obtained to evaluate the vascular anatomy. CONTRAST:  152mL OMNIPAQUE IOHEXOL 350 MG/ML SOLN COMPARISON:  06/13/2019 FINDINGS: Cardiovascular: This is a technically adequate evaluation of the pulmonary vasculature. The chronic  right lower lobe pulmonary emboli seen previously are again noted and unchanged. There are no acute pulmonary emboli. The right hilar mass results in extrinsic compression of the right upper lobe pulmonary artery, unchanged since prior exam. No pericardial effusion.  The thoracic aorta remains unremarkable. Mediastinum/Nodes: Mediastinal and  right hilar adenopathy unchanged since prior exam. Index lymph node in the right paratracheal region image 22 measures 11 mm in short axis, stable since previous study. Right hilar mass measures 2.5 cm reference image 37, previously measuring approximately 2.2 cm. No new adenopathy. The thyroid, trachea, and esophagus are grossly normal. Lungs/Pleura: Thick-walled cavitary right upper lobe mass is again noted, measuring approximately 2.8 x 3.6 cm reference image 23, previously measuring 3.4 x 2.2 cm by my measurement. Findings are consistent with primary malignancy. Progressive wedge-shaped consolidation within the right upper lobe likely reflects pulmonary infarct given the extrinsic compression on the right upper lobe pulmonary artery by the infiltrative right hilar mass. Subcentimeter nodule left upper lobe image 28 and superior segment left lower lobe image 39 unchanged. There are trace bilateral pleural effusions right greater than left. No pneumothorax. There is narrowing of the right upper lobe bronchus and segmental airways due to the infiltrative right hilar mass described above, similar to previous study. Upper Abdomen: Numerous liver metastases are noted, please refer to CT abdomen report. Musculoskeletal: Mottled appearance of the thoracic spine consistent with bony metastases, stable since prior study. Pathologic T3 fracture is seen with mild retropulsion resulting in at least 50% narrowing of the central canal. There is likely an associated soft tissue component. No other pathologic fractures. Review of the MIP images confirms the above findings. IMPRESSION: 1.  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 No acute pulmonary embolus. 2. Cavitating right upper lobe mass consistent with lung cancer. Minimal increase in size. 3. Marked right hilar and mediastinal adenopathy as above, without significant change. There is extrinsic compression upon the right upper lobe pulmonary artery and narrowing of the right upper lobe bronchus due to the right hilar mass, stable. 4. Progressive right upper lobe pulmonary infarct due to the extrinsic compression on the right upper lobe pulmonary artery. 5. Diffuse bony metastases. Pathologic T3 fracture with resulting central canal narrowing as above. Please refer to CT thoracic spine report. 6. Trace bilateral pleural effusions. 7. Liver metastases.  Please refer to abdominal CT report. Electronically Signed   By: Randa Ngo M.D.   On: 06/17/2019 16:30   CT CERVICAL SPINE WO CONTRAST  Result Date: 06/17/2019 CLINICAL DATA:  Known brain metastases. Worsening headache. Patient recently started anticoagulation. EXAM: CT HEAD WITHOUT CONTRAST CT CERVICAL SPINE WITHOUT CONTRAST TECHNIQUE: Multidetector CT imaging of the head and cervical spine was performed following the standard protocol without intravenous contrast. Multiplanar CT image reconstructions of the cervical spine were also generated. COMPARISON:  Jun 04, 2019 CT scan of the brain. Brain MRI May 10, 2019. FINDINGS: CT HEAD FINDINGS Brain: No subdural, epidural, or subarachnoid hemorrhage. Most of the patient's known brain metastases are not visualized on this unenhanced CT scan. There is suggestion of a few metastases, very subtle. No acute cortical ischemia or infarct. Cerebellum, brainstem, and basal cisterns are unremarkable. No midline shift or mass effect identified. Vascular: No hyperdense vessel or unexpected calcification. Skull: Suspected calvarial metastases seen on MRI are not well appreciated on this study. Sinuses/Orbits: Minimal fluid in the posterior  right mastoid air cells without bony erosion. There is opacification of the sphenoid sinuses which is worsened in the interval. 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 CT CERVICAL SPINE FINDINGS Alignment: Normal. Skull base and vertebrae: No fractures identified. Relative lucency in the left lamina and pedicle at C2, extending into the spinous process with periosteal  reaction. No other discrete bony lesions in the cervical spine. A lucency in the right mandibular head is well-circumscribed. Soft tissues and spinal canal: No prevertebral fluid or swelling. No visible canal hematoma. Disc levels:  Mild multilevel degenerative disc disease. Upper chest: Only the superior most aspect of the right apical mass is seen on this CT. Several small nodules in the left apex are better assessed on today's CT scan of the chest. Other: No other abnormalities. IMPRESSION: 1. 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 There is suggestion of some of the metastases, very subtle. No midline shift. Basal cisterns are patent. 2. 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 3. Suspected calvarial metastases on the previous MRI are not well seen on this study. 4. No fracture or malalignment in the cervical spine. Lucency in the left C2 lamina and pedicle extending into the spinous process with periosteal reaction suggest metastatic disease in this region. No other definitive bony metastatic disease in the cervical spine. 5. There is a rounded lucency in the right mandibular head which is nonspecific. 6. Nodularity in the lung apices. See the CT scan of the chest for better evaluation of this region. Electronically Signed   By: Dorise Bullion III M.D   On: 06/17/2019 16:37   CT ABDOMEN PELVIS W CONTRAST  Result Date: 06/17/2019 CLINICAL DATA:  Metastatic lung  cancer, chest pain, lower and mid back pain EXAM: CT ABDOMEN AND PELVIS WITH CONTRAST TECHNIQUE: Multidetector CT imaging of the abdomen and pelvis was performed using the standard protocol following bolus administration of intravenous contrast. CONTRAST:  145mL OMNIPAQUE IOHEXOL 350 MG/ML SOLN COMPARISON:  05/10/2019 FINDINGS: Lower chest: Please refer to corresponding chest CT report describing right upper lobe malignancy, right hilar and mediastinal adenopathy, and chronic right lower lobe pulmonary emboli. Hepatobiliary: Innumerable hepatic metastases are again noted, increased in size. Largest index lesion within the caudate measures 4.9 cm image 18, previously measuring 2.8 cm image 47. No biliary dilation.  Gallbladder is unremarkable. Pancreas: Unremarkable. No pancreatic ductal dilatation or surrounding inflammatory changes. Spleen: Normal in size without focal abnormality. Adrenals/Urinary Tract: Adrenal glands are unremarkable. Kidneys are normal, without renal calculi, focal lesion, or hydronephrosis. Bladder is unremarkable. Stomach/Bowel: No bowel obstruction or ileus. No bowel wall thickening or inflammatory changes. Vascular/Lymphatic: No pathologic adenopathy within the abdomen or pelvis. Vascular structures are unremarkable. Reproductive: Prostate is unremarkable. Other: No free fluid or free gas.  No abdominal wall hernia. Musculoskeletal: Progressive diffuse bony metastases are seen throughout the bilateral hips, lumbar spine, sacrum, and bony pelvis. Please refer to dedicated CT lumbar spine describing findings related to the lumbar spine and sacrum. Increased size of the expansile left iliac crest lesion with associated soft tissue component. There are no pathologic fractures. IMPRESSION: 1. Progressive liver metastases as above. 2. Progressive bony metastases involving the lumbar spine, bony pelvis, and sacrum. Please refer to corresponding CT lumbar spine report for important findings in that  region. Electronically Signed   By: Randa Ngo M.D.   On: 06/17/2019 16:46   CT T-SPINE NO CHARGE  Result Date: 06/17/2019 CLINICAL DATA:  Metastatic lung cancer, back pain EXAM: CT THORACIC SPINE WITHOUT CONTRAST TECHNIQUE: Multidetector CT images of the thoracic were obtained using the standard protocol without intravenous contrast. COMPARISON:  06/13/2019 FINDINGS: Alignment: Alignment is anatomic. Vertebrae: Diffuse metastatic disease is seen throughout the entirety of the thoracic spine. There is moderate appearance  and destruction of the T3 vertebral body with pathologic fracture centrally with 50% loss of vertebral body height. Paraspinal and other soft tissues: Please refer to corresponding chest CT report describing a cavitating right upper lobe mass, right hilar mass, and diffuse mediastinal adenopathy. Trace bilateral pleural effusions are noted. There are chronic right lower lobe segmental pulmonary emboli. Disc levels: As result of the T3 pathologic fracture and metastatic disease described above, there is approximately 50% narrowing of the central canal at T3. At T8 and T9, there are expansile metastatic lesions resulting in bowing of the posterior margin of the vertebral bodies with estimated 50-75% narrowing of the central canal and likely cord compression. IMPRESSION: 1. Diffuse bony metastases throughout the entirety of the thoracic spine, most pronounced at T3, T8, and T9 with resulting central canal narrowing and likely cord compression. 2. Pathologic fracture of the T3 vertebral body, with 50% loss of height centrally. 3. Please refer to CT chest report describing cavitating right upper lobe mass, right hilar mass, and mediastinal adenopathy. Electronically Signed   By: Randa Ngo M.D.   On: 06/17/2019 16:40   CT L-SPINE NO CHARGE  Result Date: 06/17/2019 CLINICAL DATA:  Low back pain, metastatic lung cancer EXAM: CT LUMBAR SPINE WITHOUT CONTRAST TECHNIQUE: Multidetector CT  imaging of the lumbar spine was performed without intravenous contrast administration. Multiplanar CT image reconstructions were also generated. COMPARISON:  06/13/2019 FINDINGS: Segmentation: There are 5 non-rib-bearing lumbar type vertebral bodies. Alignment: Alignment is anatomic. Vertebrae: There is diffuse metastatic disease, with multiple lytic lesions throughout the lumbar spine. These are most pronounced within the left aspect of the L1 vertebral body extending into the left pedicle, anterior aspect of the L2 vertebral body, right L3 transverse process and central aspect L3 vertebral body, right L4 pedicle and transverse process. Additional lytic lesions are seen within the bilateral iliac bones and sacrum. Bony destruction and soft tissue mass obliterate the left S2/S3 and S3/S4 neural foramen. I do not see any pathologic fracture within the lumbar spine. Paraspinal and other soft tissues: Soft tissue mass results in obliteration of the left S2/S3 and S3/S4 neural foramen. Likely soft tissue component arising from the L1 metastatic lesion with slight enlargement of the left psoas muscle. Disc levels: There is multilevel lumbar spondylosis. Mild central canal stenosis is seen at L1/L2, L2/L3, and L3/L4. At L3/L4 there is left predominant lateral recess and neural foraminal encroachment as well. IMPRESSION: 1. Diffuse bony metastatic disease throughout the lumbar spine, sacrum, and bilateral iliac bones as above. 2. There are no pathologic fractures. Soft tissue mass and bony destruction obliterate the left S2/S3 and S3/S4 neural foramen. 3. Multilevel lumbar spondylosis most pronounced at L3/L4 with left predominant neural foraminal and lateral recess encroachment. Electronically Signed   By: Randa Ngo M.D.   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  His oncologist is Dr. Lorenso Courier who has been consulted and will follow. He needs palliative radiation therapy for his T-spine cord compression.  He was getting radiation therapy, and has last few cycles left for his spinal cord metastasis. He has a Chemo-Port on the right chest.  Plan is to start on carboplatin based chemotherapy soon.  T-spine cord  compression: Presented with worsening back pain, has worsening weakness in bilateral lower extremities more on the left.  Will start on IV Decadron.  Radiation oncology has been consulted.  Plan is to start on urgent palliative radiation therapy. I will also request for physical therapy evaluation.  DVT/PE: Recently diagnosed.  He was just discharged on Xarelto yesterday and it was day 1.  We will restart Xarelto here.  Continue 15 mg twice a day for next 20 days and then switch to 20 mg daily. CT angio did not show any acute PE.  He is maintaining his saturation on room air.  Headache/left eye vision loss: Primary lung cancer has metastatic disease to the brain and left retina.No midline shift.  Continue supportive care with pain management.  There is no worsening of metastatic disease to the brain as per the CT done today.  He is following with physician at [facility] for his left eye vision loss.  Hypertension: Hypertensive on presentation.  Currently on metoprolol.  Blood pressure is high most likely secondary to pain.  Will add antihypertensives if blood pressure remain high.  Thrombocytopenia: Most likely secondary to metastatic liver disease.  Continue monitor.  Normocytic anemia: Associated with malignancy. currently hemoglobin stable at 11.    Goals of care: Very unfortunate young man with metastatic lung cancer.  His prognosis looks slim.  Had a detailed discussion with wife at the bedside.  They  understand the prognosis.  I will also request for palliative care evaluation.     Severity of Illness: The appropriate patient status for this patient is INPATIENT.  DVT prophylaxis: Xarelto Code Status: Full Family Communication: Wife at the bedside Consults called: Oncology, radiation oncology, palliative care     Shelly Coss MD [facility]  06/17/2019, 5:58 PM

## 2019-06-17 NOTE — Progress Notes (Signed)
[facility] for IV heparin Indication: recent DVT, suspicion for PE  Allergies  Allergen Reactions  . Onion Anaphylaxis  . Other     All sea food causes swelling and a rash.    . Peanut Oil Itching  . Peanut-Containing Drug Products Itching    Patient Measurements:   Heparin Dosing Weight: 85.9 kg  Vital Signs: Temp: 97.7 F (36.5 C) (05/22 1413) Temp Source: Oral (05/22 1413) BP: 153/89 (05/22 1524) Pulse Rate: 97 (05/22 1524)  Labs: Recent Labs    06/14/19 2003 06/15/19 0329 06/15/19 0329 06/16/19 0355 06/17/19 1418 06/17/19 1436  HGB  --  10.1*   < > 10.4* 11.0*  --   HCT  --  32.1*  --  32.9* 34.6*  --   PLT  --  93*  --  97* 113*  --   APTT 81* 80*  --   --   --  32  HEPARINUNFRC  --  0.45  --   --   --   --   CREATININE  --   --   --  1.10 1.22  --   TROPONINIHS  --   --   --   --  5  --    < > = values in this interval not displayed.    Estimated Creatinine Clearance: 93 mL/min (by C-G formula based on SCr of 1.22 mg/dL).   Medications:  PTA Xarelto starter pack-patient/family report he took only one 15mg  tablet daily on 06/04/19-06/06/19 due to confusion with instructions, then started 15mg  tablet twice daily appropriately 06/07/19-06/10/19 PM. Stopped after 06/10/19 8pm dose due to port placement on 06/12/19.   Pt was recently admitted from 5/18 to 5/21.   Assessment: 70 y/oM with adenocarcinoma of the lung with metastatic disease to the brain and spine presents to [facility] with chest pain, RLQ abdominal pain, headache, and left foot pain. Patient diagnosed with DVT on 06/04/2019, discharged from ED with Xarelto starter pack at that time. Pharmacy consulted for IV heparin dosing for recent DVT and now concern for PE. CTa chest pending. Note that heparin level can be falsely elevated due to recent Xarelto use.   Significant events:  5/18 PM: CT shows small PE vs tumor invasion of pulmonary artery; heparin stopped d/t small clot  burden and risk of massive hemoptysis; [facility] will defer to CCM recommendations for [facility]. Per CCM, CT evaluation favors thrombus; will resume heparin w/ conservative dosing; stop for any s/s hemoptysis   As per previous visit, will aim for lower HL target with no bolus to risk of pulmonary hemorrhage as per discussion with Dr. Sedonia Small   Today, 06/17/19  HL 1.82, falsely elevated due to recent Xarelto administration   APTT is 32 sec  SCr 1.22 mg/dl    Goal of Therapy:  APTT 66-84 seconds Heparin level 0.3-0.5 units/ml Monitor platelets by anticoagulation protocol: Yes   Plan:   Start heparin at 800 units/hr as this was the previous therapeutic rate   Obtain aPTT 6 hours after start of infusion   Daily CBC and aPTT/ heparin level   Monitor closely for s/sx of bleeding  Royetta Asal, PharmD, BCPS 06/17/2019 4:29 PM

## 2019-06-18 ENCOUNTER — Encounter (HOSPITAL_COMMUNITY): Payer: Self-pay | Admitting: Internal Medicine

## 2019-06-18 ENCOUNTER — Other Ambulatory Visit: Payer: Self-pay

## 2019-06-18 DIAGNOSIS — C3491 Malignant neoplasm of unspecified part of right bronchus or lung: Secondary | ICD-10-CM

## 2019-06-18 DIAGNOSIS — Z515 Encounter for palliative care: Secondary | ICD-10-CM

## 2019-06-18 LAB — CBC
HCT: 35.3 % — ABNORMAL LOW (ref 39.0–52.0)
Hemoglobin: 11 g/dL — ABNORMAL LOW (ref 13.0–17.0)
MCH: 27.4 pg (ref 26.0–34.0)
MCHC: 31.2 g/dL (ref 30.0–36.0)
MCV: 88 fL (ref 80.0–100.0)
Platelets: 114 10*3/uL — ABNORMAL LOW (ref 150–400)
RBC: 4.01 MIL/uL — ABNORMAL LOW (ref 4.22–5.81)
RDW: 17 % — ABNORMAL HIGH (ref 11.5–15.5)
WBC: 7 10*3/uL (ref 4.0–10.5)
nRBC: 0 % (ref 0.0–0.2)

## 2019-06-18 LAB — COMPREHENSIVE METABOLIC PANEL
ALT: 355 U/L — ABNORMAL HIGH (ref 0–44)
AST: 146 U/L — ABNORMAL HIGH (ref 15–41)
Albumin: 2.9 g/dL — ABNORMAL LOW (ref 3.5–5.0)
Alkaline Phosphatase: 328 U/L — ABNORMAL HIGH (ref 38–126)
Anion gap: 10 (ref 5–15)
BUN: 22 mg/dL — ABNORMAL HIGH (ref 6–20)
CO2: 25 mmol/L (ref 22–32)
Calcium: 8.9 mg/dL (ref 8.9–10.3)
Chloride: 105 mmol/L (ref 98–111)
Creatinine, Ser: 1.43 mg/dL — ABNORMAL HIGH (ref 0.61–1.24)
GFR calc Af Amer: >60 mL/min (ref >60)
GFR calc non Af Amer: >60 mL/min (ref >60)
Glucose, Bld: 224 mg/dL — ABNORMAL HIGH (ref 70–99)
Potassium: 4.7 mmol/L (ref 3.5–5.1)
Sodium: 140 mmol/L (ref 135–145)
Total Bilirubin: 0.6 mg/dL (ref 0.3–1.2)
Total Protein: 6.1 g/dL — ABNORMAL LOW (ref 6.5–8.1)

## 2019-06-18 LAB — CULTURE, BLOOD (ROUTINE X 2)
Culture: NO GROWTH
Special Requests: ADEQUATE

## 2019-06-18 MED ORDER — OXYCODONE HCL 5 MG PO TABS: 5.0000 mg | ORAL_TABLET | ORAL | Status: DC | PRN

## 2019-06-18 MED ORDER — RIVAROXABAN 20 MG PO TABS: 20.0000 mg | ORAL_TABLET | Freq: Every day | ORAL | Status: DC

## 2019-06-18 MED ORDER — SODIUM CHLORIDE 0.9% FLUSH
10.0000 mL | Freq: Two times a day (BID) | INTRAVENOUS | Status: DC
  Administered 2019-06-18 – 2019-06-25 (×7): 10 mL
  Administered 2019-06-26: 40 mL
  Administered 2019-06-27: 10 mL

## 2019-06-18 MED ORDER — SODIUM CHLORIDE 0.9% FLUSH: 10.0000 mL | INTRAVENOUS | Status: DC | PRN

## 2019-06-18 MED ORDER — LORAZEPAM 0.5 MG PO TABS
0.5000 mg | ORAL_TABLET | Freq: Three times a day (TID) | ORAL | Status: DC | PRN
  Administered 2019-06-18 – 2019-06-26 (×2): 0.5 mg via ORAL
  Filled 2019-06-18 (×4): qty 1

## 2019-06-18 MED ORDER — CHLORHEXIDINE GLUCONATE CLOTH 2 % EX PADS
6.0000 | MEDICATED_PAD | Freq: Every day | CUTANEOUS | Status: DC
  Administered 2019-06-18 – 2019-06-27 (×10): 6 via TOPICAL

## 2019-06-18 MED ORDER — FENTANYL 25 MCG/HR TD PT72
1.0000 | MEDICATED_PATCH | TRANSDERMAL | Status: DC
  Administered 2019-06-18: 1 via TRANSDERMAL
  Filled 2019-06-18: qty 1

## 2019-06-18 MED ORDER — HYDROCOD POLST-CPM POLST ER 10-8 MG/5ML PO SUER
5.0000 mL | Freq: Two times a day (BID) | ORAL | Status: DC | PRN
  Administered 2019-06-18 – 2019-06-19 (×3): 5 mL via ORAL
  Filled 2019-06-18 (×3): qty 5

## 2019-06-18 MED ORDER — DEXAMETHASONE 4 MG PO TABS
4.0000 mg | ORAL_TABLET | Freq: Two times a day (BID) | ORAL | Status: DC
  Administered 2019-06-18: 4 mg via ORAL
  Filled 2019-06-18 (×2): qty 1

## 2019-06-18 MED ORDER — RIVAROXABAN 15 MG PO TABS
15.0000 mg | ORAL_TABLET | Freq: Two times a day (BID) | ORAL | Status: DC
  Administered 2019-06-18 – 2019-06-28 (×20): 15 mg via ORAL
  Filled 2019-06-18 (×20): qty 1

## 2019-06-18 MED ORDER — ACETAMINOPHEN 325 MG PO TABS
650.0000 mg | ORAL_TABLET | Freq: Four times a day (QID) | ORAL | Status: DC | PRN
  Administered 2019-06-20: 650 mg via ORAL
  Filled 2019-06-18 (×3): qty 2

## 2019-06-18 NOTE — Consult Note (Addendum)
Palliative Medicine   Name: William Ali Date: 06/18/2019 MRN: [id number]  DOB: [birth year]  Patient Care Team: Patient, No Pcp Per as PCP - General (General Practice)    REASON FOR CONSULTATION: William Ali is a 34 y.o. 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  Patient was seen in the ED on 05/10/2019 with a headache and left pelvic pain.  He subsequently underwent CT of the C/A/P and MRI of the brain and was found to have widely metastatic disease with a right lung mass, lytic lesions throughout the thoracic and lumbar spine and pelvis, and numerous liver lesions.  Patient subsequently underwent liver biopsy on 05/16/2019 with pathology showing poorly differentiated adenocarcinoma.  He established care with Dr. Lorenso Courier with medical oncology and [name] with neuro oncology.  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 Patient started whole brain radiation and RT to the spine/pelvis.  Plan was to initiate systemic chemotherapy/immunotherapy but this has not started yet.  Patient was seen in the ED on 06/02/2019 for abdominal/sternal pain and fatigue.  He was seen again in the ED on 06/04/2019 for headache and leg swelling.  He was diagnosed with a DVT of the left lower extremity and was started on Xarelto.  Patient underwent port placement on 06/12/2019.  He was subsequently hospitalized 06/13/2019 to 06/16/2019 with PE, which was managed inpatient with heparin and then Xarelto was resumed outpatient.  Patient is now readmitted 06/17/2019 with progressive back/abdominal pain and left-sided weakness/numbness.  MRIs showed borderline to mild spinal cord compression at multiple levels in the thoracic spine.  Plan is to continue RT.  Patient has continued to have severe pain.  Palliative care was consulted to help address goals and manage ongoing symptoms.  SOCIAL HISTORY:     reports  that he has never smoked. He has never used smokeless tobacco. He reports that he does not drink alcohol or use drugs.   Patient is married and lives at home with his wife.  He has 2 children ages 81 and 67.  His kids are currently staying with relatives in [place], [place].  Patient worked at a company that [employer] but was furloughed due to COVID-19 and is now unable to work given his cancer diagnosis.  ADVANCE DIRECTIVES:  Does not have  CODE STATUS: Full code  PAST MEDICAL HISTORY: Past Medical History:  Diagnosis Date  . Back pain   . Cancer ([place])   . Hypertension     PAST SURGICAL HISTORY:  Past Surgical History:  Procedure Laterality Date  . IR IMAGING GUIDED PORT INSERTION  06/12/2019  . KNEE SURGERY      HEMATOLOGY/ONCOLOGY HISTORY:  Oncology History  Metastatic adenocarcinoma to brain [name])  05/21/2019 Initial Diagnosis   Metastatic adenocarcinoma to brain ([place])   06/15/2019 -  Chemotherapy   The patient had palonosetron (ALOXI) injection 0.25 mg, 0.25 mg, Intravenous,  Once, 0 of 6 cycles PEMEtrexed (ALIMTA) 1,100 mg in sodium chloride 0.9 % 100 mL chemo infusion, 500 mg/m2 = 1,100 mg, Intravenous,  Once, 0 of 6 cycles CARBOplatin (PARAPLATIN) 629.5 mg in sodium chloride 0.9 % 250 mL chemo infusion, 629.5 mg (100 % of original dose 629.5 mg), Intravenous,  Once, 0 of 6 cycles Dose modification:   (original dose 629.5 mg, Cycle 1) fosaprepitant (EMEND) 150 mg in sodium chloride 0.9 % 145 mL IVPB,  150 mg, Intravenous,  Once, 0 of 6 cycles pembrolizumab (KEYTRUDA) 200 mg in sodium chloride 0.9 % 50 mL chemo infusion, 200 mg, Intravenous, Once, 0 of 6 cycles  for chemotherapy treatment.    Adenocarcinoma of lung ([place])  05/21/2019 Initial Diagnosis   Adenocarcinoma of lung ([place])   06/15/2019 -  Chemotherapy   The patient had palonosetron (ALOXI) injection 0.25 mg, 0.25 mg, Intravenous,  Once, 0 of 6 cycles PEMEtrexed (ALIMTA) 1,100 mg in sodium chloride  0.9 % 100 mL chemo infusion, 500 mg/m2 = 1,100 mg, Intravenous,  Once, 0 of 6 cycles CARBOplatin (PARAPLATIN) 629.5 mg in sodium chloride 0.9 % 250 mL chemo infusion, 629.5 mg (100 % of original dose 629.5 mg), Intravenous,  Once, 0 of 6 cycles Dose modification:   (original dose 629.5 mg, Cycle 1) fosaprepitant (EMEND) 150 mg in sodium chloride 0.9 % 145 mL IVPB, 150 mg, Intravenous,  Once, 0 of 6 cycles pembrolizumab (KEYTRUDA) 200 mg in sodium chloride 0.9 % 50 mL chemo infusion, 200 mg, Intravenous, Once, 0 of 6 cycles  for chemotherapy treatment.      ALLERGIES:  is allergic to onion; other; peanut oil; and peanut-containing drug products.  MEDICATIONS:  Current Facility-Administered Medications  Medication Dose Route Frequency Provider Last Rate Last Admin  . 0.9 %  sodium chloride infusion   Intravenous Continuous Donnamae Jude, MD 75 mL/hr at 06/18/19 0925 New Bag at 06/18/19 0925  . acetaminophen (TYLENOL) tablet 650 mg  650 mg Oral Q6H PRN Donnamae Jude, MD      . albuterol (PROVENTIL) (2.5 MG/3ML) 0.083% nebulizer solution 3 mL  3 mL Inhalation Q6H PRN Donnamae Jude, MD      . alum & mag hydroxide-simeth (MAALOX/MYLANTA) 200-200-20 MG/5ML suspension 30 mL  30 mL Oral Q6H PRN Donnamae Jude, MD      . Chlorhexidine Gluconate Cloth 2 % PADS 6 each  6 each Topical Daily Donnamae Jude, MD   6 each at 06/18/19 [phone]  . chlorpheniramine-HYDROcodone (TUSSIONEX) 10-8 MG/5ML suspension 5 mL  5 mL Oral Q12H PRN Donnamae Jude, MD   5 mL at 06/18/19 0806  . dexamethasone (DECADRON) injection 6 mg  6 mg Intravenous Q6H Donnamae Jude, MD   6 mg at 06/18/19 5631  . HYDROmorphone (DILAUDID) injection 1 mg  1 mg Intravenous Q2H PRN Shelly Coss, MD   1 mg at 06/18/19 0919  . metoprolol tartrate (LOPRESSOR) tablet 12.5 mg  12.5 mg Oral BID Donnamae Jude, MD   12.5 mg at 06/18/19 0806  . ondansetron (ZOFRAN) tablet 4 mg  4 mg Oral Q6H PRN Donnamae Jude, MD       Or  . ondansetron [facility])  injection 4 mg  4 mg Intravenous Q6H PRN Donnamae Jude, MD   4 mg at 06/17/19 1926  . oxyCODONE (Oxy IR/ROXICODONE) immediate release tablet 5 mg  5 mg Oral Q3H PRN Donnamae Jude, MD      . pantoprazole (PROTONIX) EC tablet 40 mg  40 mg Oral BID Donnamae Jude, MD   40 mg at 06/18/19 0806  . polyethylene glycol (MIRALAX / GLYCOLAX) packet 17 g  17 g Oral Daily Donnamae Jude, MD   17 g at 06/18/19 0806  . Rivaroxaban (XARELTO) tablet 15 mg  15 mg Oral BID WC Donnamae Jude, MD   15 mg at 06/18/19 0806   Followed by  . [START ON 07/01/2019] rivaroxaban (XARELTO) tablet  20 mg  20 mg Oral Q supper Donnamae Jude, MD      . senna [facility]) tablet 8.6 mg  1 tablet Oral BID Donnamae Jude, MD   8.6 mg at 06/18/19 0806  . sodium chloride flush (NS) 0.9 % injection 10-40 mL  10-40 mL Intracatheter Q12H Donnamae Jude, MD   10 mL at 06/18/19 0807  . sodium chloride flush (NS) 0.9 % injection 10-40 mL  10-40 mL Intracatheter PRN Donnamae Jude, MD        VITAL SIGNS: BP (!) 149/82 (BP Location: Left Arm)   Pulse 97   Temp 99 F (37.2 C) (Oral)   Resp 16   Ht '5\' 7"'$  (1.702 m)   Wt 218 lb 4.1 oz (99 kg)   SpO2 99%   BMI 34.18 kg/m  Filed Weights   06/17/19 1821  Weight: 218 lb 4.1 oz (99 kg)    Estimated body mass index is 34.18 kg/m as calculated from the following:   Height as of this encounter: '5\' 7"'$  (1.702 m).   Weight as of this encounter: 218 lb 4.1 oz (99 kg).  LABS: CBC:    Component Value Date/Time   WBC 7.0 06/18/2019 0450   HGB 11.0 (L) 06/18/2019 0450   HCT 35.3 (L) 06/18/2019 0450   PLT 114 (L) 06/18/2019 0450   MCV 88.0 06/18/2019 0450   NEUTROABS 4.5 06/13/2019 1153   LYMPHSABS 0.2 (L) 06/13/2019 1153   MONOABS 0.5 06/13/2019 1153   EOSABS 0.1 06/13/2019 1153   BASOSABS 0.0 06/13/2019 1153   Comprehensive Metabolic Panel:    Component Value Date/Time   NA 140 06/18/2019 0450   K 4.7 06/18/2019 0450   CL 105 06/18/2019 0450   CO2 25 06/18/2019 0450   BUN 22 (H)  06/18/2019 0450   CREATININE 1.43 (H) 06/18/2019 0450   GLUCOSE 224 (H) 06/18/2019 0450   CALCIUM 8.9 06/18/2019 0450   AST 146 (H) 06/18/2019 0450   ALT 355 (H) 06/18/2019 0450   ALKPHOS 328 (H) 06/18/2019 0450   BILITOT 0.6 06/18/2019 0450   PROT 6.1 (L) 06/18/2019 0450   ALBUMIN 2.9 (L) 06/18/2019 0450    RADIOGRAPHIC STUDIES: DG Chest 2 View  Result Date: 06/17/2019 CLINICAL DATA:  Chest pain, history of prior PE EXAM: CHEST - 2 VIEW COMPARISON:  06/13/2019 FINDINGS: Cardiac shadow is within normal limits. Left lung is clear. Fullness in the right hilum and suprahilar region is noted with volume loss in the upper lobe consistent with the known mass lesion. Right chest wall port is seen in satisfactory position. IMPRESSION: Increasing fullness in the right hilar and suprahilar region with volume loss in the upper lobe slightly progressed when compared with the prior exam. Electronically Signed   By: Inez Catalina M.D.   On: 06/17/2019 14:58   DG Tibia/Fibula Left  Result Date: 06/04/2019 CLINICAL DATA:  Leg pain EXAM: LEFT TIBIA AND FIBULA - 2 VIEW COMPARISON:  None. FINDINGS: No fracture of the tibia or fibula. Knee joint and ankle joint appear normal on two views. The lateral tibial plateau appears irregular however on the comparison femur exam same day the lateral tibial plateau appears normal. IMPRESSION: No fracture or dislocation. No aggressive osseous lesion identified. Electronically Signed   By: Suzy Bouchard M.D.   On: 06/04/2019 09:00   CT HEAD WO CONTRAST  Result Date: 06/17/2019 CLINICAL DATA:  Known brain metastases. Worsening headache. Patient recently started anticoagulation. EXAM: CT HEAD WITHOUT CONTRAST  CT CERVICAL SPINE WITHOUT CONTRAST TECHNIQUE: Multidetector CT imaging of the head and cervical spine was performed following the standard protocol without intravenous contrast. Multiplanar CT image reconstructions of the cervical spine were also generated. COMPARISON:   Jun 04, 2019 CT scan of the brain. Brain MRI May 10, 2019. FINDINGS: CT HEAD FINDINGS Brain: No subdural, epidural, or subarachnoid hemorrhage. Most of the patient's known brain metastases are not visualized on this unenhanced CT scan. There is suggestion of a few metastases, very subtle. No acute cortical ischemia or infarct. Cerebellum, brainstem, and basal cisterns are unremarkable. No midline shift or mass effect identified. Vascular: No hyperdense vessel or unexpected calcification. Skull: Suspected calvarial metastases seen on MRI are not well appreciated on this study. Sinuses/Orbits: Minimal fluid in the posterior right mastoid air cells without bony erosion. There is opacification of the sphenoid sinuses which is worsened in the interval. 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 CT CERVICAL SPINE FINDINGS Alignment: Normal. Skull base and vertebrae: No fractures identified. Relative lucency in the left lamina and pedicle at C2, extending into the spinous process with periosteal reaction. No other discrete bony lesions in the cervical spine. A lucency in the right mandibular head is well-circumscribed. Soft tissues and spinal canal: No prevertebral fluid or swelling. No visible canal hematoma. Disc levels:  Mild multilevel degenerative disc disease. Upper chest: Only the superior most aspect of the right apical mass is seen on this CT. Several small nodules in the left apex are better assessed on today's CT scan of the chest. Other: No other abnormalities. IMPRESSION: 1. 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 There is suggestion of some of the metastases, very subtle. No midline shift. Basal cisterns are patent. 2. 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 3. Suspected calvarial metastases on the previous MRI are not well seen on  this study. 4. No fracture or malalignment in the cervical spine. Lucency in the left C2 lamina and pedicle extending into the spinous process with periosteal reaction suggest metastatic disease in this region. No other definitive bony metastatic disease in the cervical spine. 5. There is a rounded lucency in the right mandibular head which is nonspecific. 6. Nodularity in the lung apices. See the CT scan of the chest for better evaluation of this region. Electronically Signed   By: Dorise Bullion III M.D   On: 06/17/2019 16:37   CT Head Wo Contrast  Result Date: 06/04/2019 CLINICAL DATA:  Worsening headaches. Additional history provided: Recently diagnosed with metastatic lung cancer. EXAM: CT HEAD WITHOUT CONTRAST TECHNIQUE: Contiguous axial images were obtained from the base of the skull through the vertex without intravenous contrast. COMPARISON:  Brain MRI 05/10/2019, head CT 09/13/2012 FINDINGS: Brain: Numerous small enhancing parenchymal lesions were demonstrated on prior MRI 05/10/2019. Some of these are appreciated on today's examination but many of these are occult by CT. Several of these lesions were hemorrhagic on the prior MRI. There is a 3 mm focus of hyperdensity within the left occipital lobe consistent with redemonstrated hemorrhage associated with a lesion at this site (series 2, image 17). No definitively new lesion is identified. No demarcated cortical infarct. No extra-axial fluid collection. No evidence of intracranial mass. No midline shift. Vascular: No hyperdense vessel. Skull: No calvarial fracture. Suspected metastases within the skull base and upper cervical spine were better appreciated  on prior MRI. Sinuses/Orbits: No acute orbital abnormality by CT. Please note subtle enhancement along the posterior left globe with suspected on prior MRI. Ethmoid and sphenoid sinus mucosal thickening. Right sphenoid sinus air-fluid level. No significant mastoid effusion IMPRESSION: Numerous small  parenchymal lesions (likely metastases) were demonstrated on prior MRI 05/10/2019. Some of these lesions are appreciated on today's examination, but many of these are occult by CT. There is a 3 mm focus of hyperdensity consistent with hemorrhage at site of a known left occipital lobe lesion. However, this is unchanged and hemorrhage was present at this site on prior MRI. No definitively new lesion or interval acute intracranial abnormality is identified. Suspected metastases within the skull base and upper cervical spine were better appreciated on prior MRI. Paranasal sinus mucosal thickening with right sphenoid sinus air-fluid level. Correlate for acute sinusitis. Electronically Signed   By: Kellie Simmering DO   On: 06/04/2019 08:44   CT ANGIO CHEST PE W OR WO CONTRAST  Result Date: 06/17/2019 CLINICAL DATA:  Recently diagnosed metastatic lung cancer, worsening shortness of breath, chest pain, dyspnea EXAM: CT ANGIOGRAPHY CHEST WITH CONTRAST TECHNIQUE: Multidetector CT imaging of the chest was performed using the standard protocol during bolus administration of intravenous contrast. Multiplanar CT image reconstructions and MIPs were obtained to evaluate the vascular anatomy. CONTRAST:  154m OMNIPAQUE IOHEXOL 350 MG/ML SOLN COMPARISON:  06/13/2019 FINDINGS: Cardiovascular: This is a technically adequate evaluation of the pulmonary vasculature. The chronic right lower lobe pulmonary emboli seen previously are again noted and unchanged. There are no acute pulmonary emboli. The right hilar mass results in extrinsic compression of the right upper lobe pulmonary artery, unchanged since prior exam. No pericardial effusion.  The thoracic aorta remains unremarkable. Mediastinum/Nodes: Mediastinal and right hilar adenopathy unchanged since prior exam. Index lymph node in the right paratracheal region image 22 measures 11 mm in short axis, stable since previous study. Right hilar mass measures 2.5 cm reference image 37,  previously measuring approximately 2.2 cm. No new adenopathy. The thyroid, trachea, and esophagus are grossly normal. Lungs/Pleura: Thick-walled cavitary right upper lobe mass is again noted, measuring approximately 2.8 x 3.6 cm reference image 23, previously measuring 3.4 x 2.2 cm by my measurement. Findings are consistent with primary malignancy. Progressive wedge-shaped consolidation within the right upper lobe likely reflects pulmonary infarct given the extrinsic compression on the right upper lobe pulmonary artery by the infiltrative right hilar mass. Subcentimeter nodule left upper lobe image 28 and superior segment left lower lobe image 39 unchanged. There are trace bilateral pleural effusions right greater than left. No pneumothorax. There is narrowing of the right upper lobe bronchus and segmental airways due to the infiltrative right hilar mass described above, similar to previous study. Upper Abdomen: Numerous liver metastases are noted, please refer to CT abdomen report. Musculoskeletal: Mottled appearance of the thoracic spine consistent with bony metastases, stable since prior study. Pathologic T3 fracture is seen with mild retropulsion resulting in at least 50% narrowing of the central canal. There is likely an associated soft tissue component. No other pathologic fractures. Review of the MIP images confirms the above findings. IMPRESSION: 1. 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 No acute pulmonary embolus. 2. Cavitating right upper lobe mass consistent with lung cancer. Minimal increase in size. 3. Marked right hilar and mediastinal adenopathy as above, without significant change. There is extrinsic compression upon the right upper lobe pulmonary artery and narrowing of the right upper lobe bronchus due to the right hilar mass,  stable. 4. Progressive right upper lobe pulmonary infarct due to the extrinsic compression on the right upper lobe pulmonary artery. 5. Diffuse bony metastases.  Pathologic T3 fracture with resulting central canal narrowing as above. Please refer to CT thoracic spine report. 6. Trace bilateral pleural effusions. 7. Liver metastases.  Please refer to abdominal CT report. Electronically Signed   By: Randa Ngo M.D.   On: 06/17/2019 16:30   CT Angio Chest PE W and/or Wo Contrast  Result Date: 06/13/2019 CLINICAL DATA:  Shortness of breath. Chest pain. Metastatic lung carcinoma. Active chemotherapy and radiation. EXAM: CT ANGIOGRAPHY CHEST WITH CONTRAST TECHNIQUE: Multidetector CT imaging of the chest was performed using the standard protocol during bolus administration of intravenous contrast. Multiplanar CT image reconstructions and MIPs were obtained to evaluate the vascular anatomy. CONTRAST:  1102m OMNIPAQUE IOHEXOL 350 MG/ML SOLN COMPARISON:  Chest radiograph earlier this day. Most recent chest CT 05/10/2019 FINDINGS: Cardiovascular: Right hilar adenopathy/soft tissue density causes severe narrowing of the right upper lobe pulmonary arteries which are attenuated. This causes mass effect on the distal main right pulmonary artery. Circumferential soft tissue density involving the right lower lobar pulmonary artery. Filling defects within the segmental branches of the right lower lobe pulmonary arteries are eccentric and may be due to direct invasion or de novo pulmonary embolus, for example series 6, image 145 and 149. No definite pulmonary emboli on the left, however breathing motion artifact and contrast bolus timing limits detailed assessment. Thoracic aorta is normal in caliber without dissection. Right chest port tip in the lower SVC. Heart is normal in size. No pericardial effusion. Mediastinum/Nodes: Progressive right hilar adenopathy/soft tissue density causes severe narrowing of the right upper lobe pulmonary arteries. This is increased from prior exam. Representative right hilar lymph node measures 22 mm, series 5, image 59, previously 19 mm. This is  contiguous with right paratracheal soft tissue densities/adenopathy which is difficult to delineate. Right lower paratracheal node has increased currently 13 mm, series 5, image 52, previously 10 mm. High mediastinal node measures 11 mm, series 5, image 37, unchanged. No left hilar adenopathy. Esophagus slightly patulous without wall thickening. No visualized thyroid nodule. Lungs/Pleura: Spiculated right upper lobe cavitary nodule currently measuring approximately 3 x 2.6 cm with decreased peripheral soft tissue component from prior exam. This is contiguous with soft tissue density that extends to the right hilum. Severe narrowing of the right upper lobe bronchus. Ill-defined paramediastinal density in the right upper lobe is likely post treatment related change. Peripheral subpleural opacity in the right upper lobe abutting, series 7, image 57 is nonspecific, and may represent pulmonary infarct, post treatment related change, or pneumonia. Small left apical nodule series 7, image 39 and 40 are unchanged. Small nodule in the superior segment of the left lower lobe is unchanged, series 7, image 55. 3 mm perifissural nodule in the right middle lobe, series 7, image 64, unchanged. No definite new nodule. No pleural fluid. Upper Abdomen: Known metastatic disease in the liver, not as well visualized on the current exam given phase of contrast. No acute abdominal findings. 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 T3 lesion causing mild pathologic compression fracture, similar to prior. No new spinal compression fractures. Review of the MIP images confirms the above findings. IMPRESSION: 1. Progressive right hilar adenopathy/soft tissue density which causes severe narrowing of the right upper lobe pulmonary arteries which are attenuated. Small segmental pulmonary emboli in the right lower lobe are adjacent to adenopathy, may represent direct invasion  versus did no novo clot. Thromboembolic burden is  small. No pulmonary embolus on the left, however breathing motion artifact and contrast bolus timing limits detailed assessment. 2. Spiculated right upper lobe cavitary nodule with decreased peripheral soft tissue component from prior exam and slight decrease in size. 3. Progressive right hilar adenopathy/soft tissue density causes severe narrowing of the right upper lobe bronchus. There may be an element of post treatment related change, however some of the mediastinal nodes have increased in size suspicious for progression in disease. 4. Additional small pulmonary nodules both lungs are unchanged from exam last month. 5. Osseous metastatic disease is grossly unchanged. Known hepatic metastatic disease is not well delineated given phase of contrast These results were called by telephone at the time of interpretation on 06/13/2019 at 1:52 pm to [place] , who verbally acknowledged these results. Electronically Signed   By: Keith Rake M.D.   On: 06/13/2019 13:53   CT CERVICAL SPINE WO CONTRAST  Result Date: 06/17/2019 CLINICAL DATA:  Known brain metastases. Worsening headache. Patient recently started anticoagulation. EXAM: CT HEAD WITHOUT CONTRAST CT CERVICAL SPINE WITHOUT CONTRAST TECHNIQUE: Multidetector CT imaging of the head and cervical spine was performed following the standard protocol without intravenous contrast. Multiplanar CT image reconstructions of the cervical spine were also generated. COMPARISON:  Jun 04, 2019 CT scan of the brain. Brain MRI May 10, 2019. FINDINGS: CT HEAD FINDINGS Brain: No subdural, epidural, or subarachnoid hemorrhage. Most of the patient's known brain metastases are not visualized on this unenhanced CT scan. There is suggestion of a few metastases, very subtle. No acute cortical ischemia or infarct. Cerebellum, brainstem, and basal cisterns are unremarkable. No midline shift or mass effect identified. Vascular: No hyperdense vessel or unexpected calcification.  Skull: Suspected calvarial metastases seen on MRI are not well appreciated on this study. Sinuses/Orbits: Minimal fluid in the posterior right mastoid air cells without bony erosion. There is opacification of the sphenoid sinuses which is worsened in the interval. 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 CT CERVICAL SPINE FINDINGS Alignment: Normal. Skull base and vertebrae: No fractures identified. Relative lucency in the left lamina and pedicle at C2, extending into the spinous process with periosteal reaction. No other discrete bony lesions in the cervical spine. A lucency in the right mandibular head is well-circumscribed. Soft tissues and spinal canal: No prevertebral fluid or swelling. No visible canal hematoma. Disc levels:  Mild multilevel degenerative disc disease. Upper chest: Only the superior most aspect of the right apical mass is seen on this CT. Several small nodules in the left apex are better assessed on today's CT scan of the chest. Other: No other abnormalities. IMPRESSION: 1. 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 There is suggestion of some of the metastases, very subtle. No midline shift. Basal cisterns are patent. 2. 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 3. Suspected calvarial metastases on the previous MRI are not well seen on this study. 4. No fracture or malalignment in the cervical spine. Lucency in the left C2 lamina and pedicle extending into the spinous process with periosteal reaction suggest metastatic disease in this region. No other definitive bony metastatic disease in the cervical spine. 5. There is a rounded lucency in the right mandibular head which is nonspecific. 6. Nodularity in the lung apices. See the CT scan of  the chest for better evaluation of this region. Electronically Signed   By: Dorise Bullion III M.D   On: 06/17/2019 16:37   MR Cervical Spine Wo Contrast  Result Date: 06/17/2019 CLINICAL DATA:  34 year old male with widely metastatic lung cancer. Back pain. EXAM: MRI CERVICAL SPINE WITHOUT CONTRAST TECHNIQUE: Multiplanar, multisequence MR imaging of the cervical spine was performed. No intravenous contrast was administered. COMPARISON:  CT Thoracic Spine, Lumbar Spine, CT Chest, Abdomen, and Pelvis yesterday. Brain MRI 05/10/2019. FINDINGS: Alignment: Overall straightening of cervical lordosis. Vertebrae: Metastatic disease to bone at virtually all cervical spine levels, and throughout the visible skull base. Only the C1 vertebra seems to be spared at this time. Anterior and posterior element metastases in the cervical spine. No pathologic cervical vertebral fracture, although there is epidural and extraosseous extension of tumor from the left C2 posterior elements (series 13, image 4) with severe involvement of the left C3 neural foramen. No significant cervical spinal stenosis. See thoracic spine findings reported separately. Cord: Spinal cord signal is within normal limits at all visualized levels. No IV contrast administered. Posterior Fossa, vertebral arteries, paraspinal tissues: Cervicomedullary junction is within normal limits. Abnormal T2 and STIR hyperintensity in the visible right cerebellar hemisphere compatible with known brain metastases. No posterior fossa mass effect is evident. Preserved major vascular flow voids in the neck. There is inflammation of the C2-C3 level posterior paraspinal muscles greater on the left which is probably related to the C2 extraosseous tumor. Disc levels: Incidental degenerative changes including: C3-C4: Small leftward disc herniation with mild spinal stenosis and mild spinal cord mass effect. C4-C5: Disc bulge and small rightward disc herniation with mild spinal stenosis and mild right hemi cord mass effect. Associated moderate to severe right C5  foraminal stenosis. IMPRESSION: 1. Widespread osseous metastatic disease throughout the cervical spine and the visible skull base. 2. Epidural and extraosseous extension of tumor from the left C2 posterior elements with severe involvement of the left C3 neural foramen, but no malignant spinal stenosis. There is mild degenerative cervical spinal stenosis at C3-C4 and C4-C5. 3. No pathologic cervical vertebral fracture. 4. See Thoracic Spine findings reported separately. Electronically Signed   By: Genevie Ann M.D.   On: 06/17/2019 21:59   MR THORACIC SPINE WO CONTRAST  Result Date: 06/17/2019 CLINICAL DATA:  34 year old male with widely metastatic lung cancer. Back pain. EXAM: MRI THORACIC SPINE WITHOUT CONTRAST TECHNIQUE: Multiplanar, multisequence MR imaging of the thoracic spine was performed. No intravenous contrast was administered. COMPARISON:  Cervical MRI today reported separately. Thoracic spine CT, CT Chest, Abdomen, and Pelvis yesterday. FINDINGS: Limited cervical spine imaging:  Reported separately today. Thoracic spine segmentation:  Normal. Alignment:  Straightening of thoracic kyphosis. Vertebrae: Thoracic vertebral metastases at every level. Subtotal T1 vertebral replacement by tumor. Early extraosseous extension from the spinous process into the paraspinal muscles. T3 vertebral body pathologic compression fracture with 50% central loss of vertebral body height, and some retropulsed bone/tumor. Additionally, there is evidence of some epidural tumor extension at that level (series 34, image 15) resulting in mild spinal stenosis, borderline to mild cord compression. Moderate left T3 neural foraminal stenosis. Epidural tumor also at the T5 level where the vertebra is completely replaced by tumor. Abundant extraosseous extension of tumor also from the posterior elements into the posterior paraspinal musculature. Mild spinal stenosis and borderline to mild cord compression. T6 near complete vertebral  replacement by tumor with ventral and foraminal epidural tumor extension. When superimposed on epidural lipomatosis there is  mild spinal stenosis and cord compression (series 34, image 28). Moderate to severe right T6 foraminal involvement. Ventral epidural tumor extension at T7 without cord compression at this time (series 34, image 32). Ventral epidural tumor extension at T9 with mild spinal stenosis and borderline to mild cord compression at this time (image 42). T10 right side posterior element extraosseous extension of tumor into the paraspinal muscles. Cord: No thoracic cord signal abnormality despite the multiple levels of epidural tumor. Paraspinal and other soft tissues: Abnormal right upper lung and mediastinum. Small layering right pleural effusion. Partially visible liver metastases. Disc levels: There are some superimposed thoracic degenerative changes, including: -Mild degenerative thoracic spinal stenosis at T8-T9 related to left eccentric disc and endplate degeneration, mild to moderate posterior element degeneration. -borderline to mild degenerative spinal stenosis at T11-T12 related to right paracentral disc protrusion and mild facet hypertrophy. IMPRESSION: 1. Bone metastases involving every thoracic spine level. Pathologic fracture of T3 with up to 50% central vertebral loss of height, mild retropulsion of bone in tumor. 2. Ventral epidural tumor with up to mild spinal stenosis and borderline to mild cord compression at: T3, T5, T6, T7, T9. 3. Moderate or severe neural foraminal involvement by tumor at the left T3, right T6 nerve levels. 4. Superimposed mild degenerative thoracic spinal stenosis at T8-T9 and T11-T12. Electronically Signed   By: Genevie Ann M.D.   On: 06/17/2019 22:09   MR LUMBAR SPINE WO CONTRAST  Result Date: 06/17/2019 CLINICAL DATA:  34 year old male with widely metastatic lung cancer. Back pain. EXAM: MRI LUMBAR SPINE WITHOUT CONTRAST TECHNIQUE: Multiplanar, multisequence MR  imaging of the lumbar spine was performed. No intravenous contrast was administered. COMPARISON:  Thoracic spine MRI today. Lumbar CT, CT Chest, Abdomen, and Pelvis yesterday. Lumbar MRI 05/10/2019. FINDINGS: Segmentation: Normal, concordant with the thoracic spine numbering today. Alignment:  Stable lumbar lordosis since April. Vertebrae: Lumbar metastatic disease at all levels. Compared to 05/10/2019 lumbar vertebral tumor shows mild generalized progression. Confluent sacral and medial iliac bone metastatic disease redemonstrated. Mild pathologic fracture of the L2 superior endplate. This has only mildly progressed since April (series 38, image 9). No other lumbar pathologic fracture. And although there is some early lumbar extraosseous tumor extension (right L4 transverse process series 39, image 25), there is no significant lumbar epidural or neural foraminal tumor at this time. However, there is obliteration of the right S1, left S2 and S3 neural foramina by tumor. Conus medullaris and cauda equina: Conus extends to the T12-L1 level. No lower spinal cord or conus signal abnormality. Cauda equina nerve roots appear normal in the absence of contrast. Paraspinal and other soft tissues: Stable visible abdominal viscera. Disc levels: Lumbar epidural lipomatosis. Superimposed lumbar degenerative changes most notable for: -L3-L4 left paracentral disc protrusion superimposed on disc bulge, posterior element hypertrophy and epidural lipomatosis. Moderate left lateral recess stenosis (left L4 nerve level) and mild spinal stenosis. IMPRESSION: 1. Lumbar metastatic disease at all levels with mild generalized progression since the April MRI. Mild pathologic fracture of the L2 superior endplate. 2. Occasional early extraosseous extension of tumor (right L4 transverse process), but no lumbar epidural or neural foraminal tumor at this time. 3. Bulky sacral tumor re-demonstrated including severe involvement of the right S1,  left S2, and left S3 neural foramina. Electronically Signed   By: Genevie Ann M.D.   On: 06/17/2019 22:16   CT ABDOMEN PELVIS W CONTRAST  Result Date: 06/17/2019 CLINICAL DATA:  Metastatic lung cancer, chest pain, lower and mid back  pain EXAM: CT ABDOMEN AND PELVIS WITH CONTRAST TECHNIQUE: Multidetector CT imaging of the abdomen and pelvis was performed using the standard protocol following bolus administration of intravenous contrast. CONTRAST:  141m OMNIPAQUE IOHEXOL 350 MG/ML SOLN COMPARISON:  05/10/2019 FINDINGS: Lower chest: Please refer to corresponding chest CT report describing right upper lobe malignancy, right hilar and mediastinal adenopathy, and chronic right lower lobe pulmonary emboli. Hepatobiliary: Innumerable hepatic metastases are again noted, increased in size. Largest index lesion within the caudate measures 4.9 cm image 18, previously measuring 2.8 cm image 47. No biliary dilation.  Gallbladder is unremarkable. Pancreas: Unremarkable. No pancreatic ductal dilatation or surrounding inflammatory changes. Spleen: Normal in size without focal abnormality. Adrenals/Urinary Tract: Adrenal glands are unremarkable. Kidneys are normal, without renal calculi, focal lesion, or hydronephrosis. Bladder is unremarkable. Stomach/Bowel: No bowel obstruction or ileus. No bowel wall thickening or inflammatory changes. Vascular/Lymphatic: No pathologic adenopathy within the abdomen or pelvis. Vascular structures are unremarkable. Reproductive: Prostate is unremarkable. Other: No free fluid or free gas.  No abdominal wall hernia. Musculoskeletal: Progressive diffuse bony metastases are seen throughout the bilateral hips, lumbar spine, sacrum, and bony pelvis. Please refer to dedicated CT lumbar spine describing findings related to the lumbar spine and sacrum. Increased size of the expansile left iliac crest lesion with associated soft tissue component. There are no pathologic fractures. IMPRESSION: 1. Progressive  liver metastases as above. 2. Progressive bony metastases involving the lumbar spine, bony pelvis, and sacrum. Please refer to corresponding CT lumbar spine report for important findings in that region. Electronically Signed   By: MRanda NgoM.D.   On: 06/17/2019 16:46   CT T-SPINE NO CHARGE  Result Date: 06/17/2019 CLINICAL DATA:  Metastatic lung cancer, back pain EXAM: CT THORACIC SPINE WITHOUT CONTRAST TECHNIQUE: Multidetector CT images of the thoracic were obtained using the standard protocol without intravenous contrast. COMPARISON:  06/13/2019 FINDINGS: Alignment: Alignment is anatomic. Vertebrae: Diffuse metastatic disease is seen throughout the entirety of the thoracic spine. There is moderate appearance and destruction of the T3 vertebral body with pathologic fracture centrally with 50% loss of vertebral body height. Paraspinal and other soft tissues: Please refer to corresponding chest CT report describing a cavitating right upper lobe mass, right hilar mass, and diffuse mediastinal adenopathy. Trace bilateral pleural effusions are noted. There are chronic right lower lobe segmental pulmonary emboli. Disc levels: As result of the T3 pathologic fracture and metastatic disease described above, there is approximately 50% narrowing of the central canal at T3. At T8 and T9, there are expansile metastatic lesions resulting in bowing of the posterior margin of the vertebral bodies with estimated 50-75% narrowing of the central canal and likely cord compression. IMPRESSION: 1. Diffuse bony metastases throughout the entirety of the thoracic spine, most pronounced at T3, T8, and T9 with resulting central canal narrowing and likely cord compression. 2. Pathologic fracture of the T3 vertebral body, with 50% loss of height centrally. 3. Please refer to CT chest report describing cavitating right upper lobe mass, right hilar mass, and mediastinal adenopathy. Electronically Signed   By: MRanda NgoM.D.   On:  06/17/2019 16:40   CT L-SPINE NO CHARGE  Result Date: 06/17/2019 CLINICAL DATA:  Low back pain, metastatic lung cancer EXAM: CT LUMBAR SPINE WITHOUT CONTRAST TECHNIQUE: Multidetector CT imaging of the lumbar spine was performed without intravenous contrast administration. Multiplanar CT image reconstructions were also generated. COMPARISON:  06/13/2019 FINDINGS: Segmentation: There are 5 non-rib-bearing lumbar type vertebral bodies. Alignment: Alignment is anatomic. Vertebrae:  There is diffuse metastatic disease, with multiple lytic lesions throughout the lumbar spine. These are most pronounced within the left aspect of the L1 vertebral body extending into the left pedicle, anterior aspect of the L2 vertebral body, right L3 transverse process and central aspect L3 vertebral body, right L4 pedicle and transverse process. Additional lytic lesions are seen within the bilateral iliac bones and sacrum. Bony destruction and soft tissue mass obliterate the left S2/S3 and S3/S4 neural foramen. I do not see any pathologic fracture within the lumbar spine. Paraspinal and other soft tissues: Soft tissue mass results in obliteration of the left S2/S3 and S3/S4 neural foramen. Likely soft tissue component arising from the L1 metastatic lesion with slight enlargement of the left psoas muscle. Disc levels: There is multilevel lumbar spondylosis. Mild central canal stenosis is seen at L1/L2, L2/L3, and L3/L4. At L3/L4 there is left predominant lateral recess and neural foraminal encroachment as well. IMPRESSION: 1. Diffuse bony metastatic disease throughout the lumbar spine, sacrum, and bilateral iliac bones as above. 2. There are no pathologic fractures. Soft tissue mass and bony destruction obliterate the left S2/S3 and S3/S4 neural foramen. 3. Multilevel lumbar spondylosis most pronounced at L3/L4 with left predominant neural foraminal and lateral recess encroachment. Electronically Signed   By: Randa Ngo M.D.   On:  06/17/2019 16:35   DG Chest Portable 1 View  Result Date: 06/13/2019 CLINICAL DATA:  Shortness of breath and chest pain EXAM: PORTABLE CHEST 1 VIEW COMPARISON:  Jun 02, 2019 FINDINGS: Port-A-Cath tip is in the superior vena cava. No pneumothorax. Lungs are clear. Heart size and pulmonary vascularity are normal. No adenopathy. No bone lesions. IMPRESSION: Port-A-Cath tip in superior vena cava. No pneumothorax. Lungs clear. Cardiac silhouette within normal limits. Electronically Signed   By: Lowella Grip III M.D.   On: 06/13/2019 10:54   DG Chest Portable 1 View  Result Date: 06/02/2019 CLINICAL DATA:  Hervey Ard right-sided chest pain EXAM: PORTABLE CHEST 1 VIEW COMPARISON:  May 06, 2019 FINDINGS: The previously demonstrated right upper lobe pulmonary nodule is better visualized on the patient's prior CT chest and prior x-rays. There is no pneumothorax. No significant pleural effusion. No evidence for an acute osseous abnormality. The heart size is normal. There is some mild thickening of the right paratracheal stripe which may be secondary to underlying mediastinal adenopathy. IMPRESSION: 1. Mild thickening of the right paratracheal stripe which may be secondary to underlying mediastinal adenopathy. 2. The previously demonstrated right upper lobe pulmonary nodule is better visualized on the patient's prior CT chest and prior x-rays. 3. No acute cardiopulmonary process. Electronically Signed   By: Constance Holster M.D.   On: 06/02/2019 17:55   DG Femur Min 2 Views Left  Result Date: 06/04/2019 CLINICAL DATA:  Lower extremity pain. EXAM: LEFT FEMUR 2 VIEWS COMPARISON:  None. FINDINGS: Frontal and lateral views were obtained. No fracture or dislocation. No abnormal periosteal reaction. No blastic or lytic bone lesions. There is moderate degenerative change in the knee joint. No appreciable knee joint effusion. IMPRESSION: No blastic or lytic bone lesions. No abnormal periosteal reaction. No fracture or  dislocation. 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 Electronically Signed   By: Lowella Grip III M.D.   On: 06/04/2019 08:55   IR IMAGING GUIDED PORT INSERTION  Result Date: 06/12/2019 INDICATION: 34 year old male with a history of metastatic right upper lobe lung cancer. He presents for port catheter placement. EXAM: IMPLANTED PORT A CATH PLACEMENT WITH ULTRASOUND AND FLUOROSCOPIC GUIDANCE  MEDICATIONS: Ancef 2 g; The antibiotic was administered within an appropriate time interval prior to skin puncture. ANESTHESIA/SEDATION: Versed 4 mg IV; Fentanyl 100 mcg IV; Moderate Sedation Time:  19 minutes The patient was continuously monitored during the procedure by the interventional radiology nurse under my direct supervision. FLUOROSCOPY TIME:  0 minutes, 30 seconds (7 mGy) COMPLICATIONS: None immediate. PROCEDURE: The right neck and chest was prepped with chlorhexidine, and draped in the usual sterile fashion using maximum barrier technique (cap and mask, sterile gown, sterile gloves, large sterile sheet, hand hygiene and cutaneous antiseptic). Local anesthesia was attained by infiltration with 1% lidocaine with epinephrine. Ultrasound demonstrated patency of the right internal jugular vein, and this was documented with an image. Under real-time ultrasound guidance, this vein was accessed with a 21 gauge micropuncture needle and image documentation was performed. A small dermatotomy was made at the access site with an 11 scalpel. A 0.018" wire was advanced into the SVC and the access needle exchanged for a 76F micropuncture vascular sheath. The 0.018" wire was then removed and a 0.035" wire advanced into the IVC. An appropriate location for the subcutaneous reservoir was selected below the clavicle and an incision was made through the skin and underlying soft tissues. The subcutaneous tissues were then dissected using a combination of blunt and sharp surgical technique and a pocket was formed. A single  lumen power injectable portacatheter was then tunneled through the subcutaneous tissues from the pocket to the dermatotomy and the port reservoir placed within the subcutaneous pocket. The venous access site was then serially dilated and a peel away vascular sheath placed over the wire. The wire was removed and the port catheter advanced into position under fluoroscopic guidance. The catheter tip is positioned in the superior cavoatrial junction. This was documented with a spot image. The portacatheter was then tested and found to flush and aspirate well. The port was flushed with saline followed by 100 units/mL heparinized saline. The pocket was then closed in two layers using first subdermal inverted interrupted absorbable sutures followed by a running subcuticular suture. The epidermis was then sealed with Dermabond. The dermatotomy at the venous access site was also closed with Dermabond. IMPRESSION: Successful placement of a right IJ approach Power Port with ultrasound and fluoroscopic guidance. The catheter is ready for use. Electronically Signed   By: Jacqulynn Cadet M.D.   On: 06/12/2019 16:26   VAS [place] LOWER EXTREMITY VENOUS (DVT) (ONLY [facility] & [facility])  Result Date: 06/04/2019  Lower Venous DVTStudy Indications: Swelling.  Risk Factors: None identified. Limitations: Poor ultrasound/tissue interface. Comparison Study: No prior studies. Performing Technologist: Oliver Hum RVT  Examination Guidelines: A complete evaluation includes B-mode imaging, spectral Doppler, color Doppler, and power Doppler as needed of all accessible portions of each vessel. Bilateral testing is considered an integral part of a complete examination. Limited examinations for reoccurring indications may be performed as noted. The reflux portion of the exam is performed with the patient in reverse Trendelenburg.  +-----+---------------+---------+-----------+----------+--------------+  RIGHTCompressibilityPhasicitySpontaneityPropertiesThrombus Aging +-----+---------------+---------+-----------+----------+--------------+ CFV  Full           Yes      Yes                                 +-----+---------------+---------+-----------+----------+--------------+   +---------+---------------+---------+-----------+----------+--------------+ LEFT     CompressibilityPhasicitySpontaneityPropertiesThrombus Aging +---------+---------------+---------+-----------+----------+--------------+ CFV      Full           Yes  Yes                                 +---------+---------------+---------+-----------+----------+--------------+ SFJ      Full                                                        +---------+---------------+---------+-----------+----------+--------------+ FV Prox  Full                                                        +---------+---------------+---------+-----------+----------+--------------+ FV Mid   Full                                                        +---------+---------------+---------+-----------+----------+--------------+ FV DistalFull                                                        +---------+---------------+---------+-----------+----------+--------------+ PFV      Full                                                        +---------+---------------+---------+-----------+----------+--------------+ POP      None           No       No                   Acute          +---------+---------------+---------+-----------+----------+--------------+ PTV      None                                         Acute          +---------+---------------+---------+-----------+----------+--------------+ PERO     None                                         Acute          +---------+---------------+---------+-----------+----------+--------------+ Gastroc  None                                          Acute          +---------+---------------+---------+-----------+----------+--------------+     Summary: RIGHT: - No evidence of common femoral vein obstruction.  LEFT: - Findings consistent with acute deep vein thrombosis involving the left popliteal vein, left posterior tibial veins, left peroneal veins, and  left gastrocnemius veins. - No cystic structure found in the popliteal fossa.  *See table(s) above for measurements and observations. Electronically signed by Deitra Mayo MD on 06/04/2019 at 12:00:49 PM.    Final     PERFORMANCE STATUS (ECOG) : 2 - Symptomatic, <50% confined to bed  Review of Systems Unless otherwise noted, a complete review of systems is negative.  Physical Exam General: NAD Pulmonary: Unlabored Extremities: no edema, no joint deformities Skin: no rashes Neurological: Weakness but otherwise nonfocal  IMPRESSION: I met with patient to discuss goals.  I introduced palliative care services and attempted to establish therapeutic rapport.  Patient seems to have a reasonable understanding of his current medical problems and plan for treatment.  He says that he has been told of the prognosis but would prefer to put his faith in God.  Patient says that his goals are aligned with the current scope of treatment.  He is anxious to start chemotherapy and wants to "fight this." Patient does admit that each radiation treatment makes him feel rather poorly, particularly with increased fatigue.  Patient is hopeful that he will be able to stay in the hospital through completion of RT.  Patient says that the plan was to start chemotherapy this week.  Medical oncology consult is pending.  Symptomatically, patient endorses generalized pain in the back, chest, and abdomen.  At its worst, he rates the pain as 9 out of 10 but says that it is currently 3 out of 10 after receiving a dose of hydromorphone.  PTA, patient was taking hydromorphone tablets 2 to 4 mg every 4 hours  around-the-clock.  He was also taking dexamethasone 4 mg twice daily.  Over the past 24 hours, patient has received a total of 6 mg IV hydromorphone.  He has found that the hydromorphone is effective but short-lived.  Total MME is approximately 120 mg.  I would recommend initiation of a long-acting opioid such as transdermal fentanyl.  Recommend continuing dexamethasone, which may help with pain associated with his lytic lesions.  Patient denies significant depression and feels that he is coping as well as he can given the circumstances.  He did receive a dose of lorazepam last evening and found that it was particularly helpful allowing him to rest comfortably.  He requests continuation of lorazepam, which would seem reasonable.  Patient endorses constipation.  LBM several days ago.  He is on a current bowel regimen.  Oral intake is reportedly poor.  He would benefit from nutrition following.  He does have occasional nausea without vomiting but denies nausea currently. Recommend continuing his dexamethasone, which may help with pain, nausea, fatigue and appetite.  Patient does not have advance directives.  [name] goals are clearly for full scope of treatment.  He will benefit from a more in-depth conversation with his wife regarding end-of-life decision-making.  We will plan to follow him closely.  PLAN: -Continue full scope/full code -Recommend starting transdermal fentanyl 25 mcg every 72 hours -Continue hydromorphone 1 mg IV every 2 hours as needed for breakthrough pain.  Transition to oral regimen when pain is stable. -Recommend lorazepam 0.5 mg PO every 8 hours as needed -Continue bowel regimen -Continue dexamethasone -We will follow   Time Total: 60 minutes  Visit consisted of counseling and education dealing with the complex and emotionally intense issues of symptom management and palliative care in the setting of serious and potentially life-threatening illness.Greater than 50%  of  this time was spent counseling and coordinating care related  to the above assessment and plan.  Signed by: Altha Harm, PhD, NP-C

## 2019-06-18 NOTE — Progress Notes (Signed)
   Two attempts were made  to paged the night shift NP [place], about the pt's pain. No response was given. The Pt stated that the one time IV Ativan, was helpful.  Report given to Amy RN who will resume care, and  will reassess the pt's pain needs.

## 2019-06-18 NOTE — Evaluation (Signed)
Physical Therapy Evaluation Patient Details Name: William Ali MRN: [id number] DOB: 11-10-1985 Today's Date: 06/18/2019   History of Present Illness  William Ali is a 34 y.o. 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  Patient was seen in the ED on 05/10/2019 with a headache and left pelvic pain.  He subsequently underwent CT of the C/A/P and MRI of the brain and was found to have widely metastatic disease with a right lung mass, lytic lesions throughout the thoracic and lumbar spine and pelvis, and numerous liver lesions.  Patient subsequently underwent liver biopsy on 05/16/2019 with pathology showing poorly differentiated adenocarcinoma.  Clinical Impression  Pt admitted with above diagnosis.  Pt currently with functional limitations due to the deficits listed below (see PT Problem List). Pt will benefit from skilled PT to increase their independence and safety with mobility to allow discharge to the venue listed below.  Pt able to ambulate with RW 110' with fatigue at end of gait with o2 99% and HR 106.  He has limited family support with just his wife and lives in a townhome with a flight of stairs to get to living area, bedrooms, and bathroom. His wife is very supportive, but states there really isn't anyone else local who can help out.  Per chart his kids are with family in [place].   OT consult ordered due to pt reporting some pain and difficulty with dressing that involves bending over and also has decreased vision and has glasses with an eye patch.  He had difficulty with obstacles on the L during gait.   Recommend HHPT, RW and 3-1 BSC. W     Follow Up Recommendations Home health PT;Supervision for mobility/OOB    Equipment Recommendations  Rolling walker with 5" wheels;3in1 (PT)    Recommendations for Other Services OT consult     Precautions / Restrictions Precautions Precautions:  Fall Restrictions Weight Bearing Restrictions: No      Mobility  Bed Mobility Overal bed mobility: Needs Assistance Bed Mobility: Rolling;Sidelying to Sit Rolling: Min guard Sidelying to sit: Min guard       General bed mobility comments: cues for log rolling technique and proper body mechanics to decrease strain on back.  Transfers Overall transfer level: Needs assistance Equipment used: Rolling walker (2 wheeled) Transfers: Sit to/from Stand Sit to Stand: Min guard;From elevated surface         General transfer comment: cues for proper hand placement  Ambulation/Gait Ambulation/Gait assistance: Min guard Gait Distance (Feet): 110 Feet Assistive device: Rolling walker (2 wheeled) Gait Pattern/deviations: Decreased step length - right;Decreased step length - left;Drifts right/left Gait velocity: decreased   General Gait Details: Cues to not pick up RW.  Some difficulty with turning and with obstacles on L side. Pt was wearing glasses with L eye patch. After gait, pt used toilet then to recliner. Pt quite fatigued. HR 106 O2 99%  Stairs            Wheelchair Mobility    Modified Rankin (Stroke Patients Only)       Balance Overall balance assessment: Needs assistance   Sitting balance-Leahy Scale: Fair Sitting balance - Comments: Pt uncomfortable sitting EOB   Standing balance support: Bilateral upper extremity supported;During functional activity Standing balance-Leahy Scale: Fair Standing balance comment: Washed hands at sink, although some leaning forward to give support against sink  Pertinent Vitals/Pain Pain Assessment: 0-10 Pain Score: 5  Pain Location: back and stomach Pain Descriptors / Indicators: Constant Pain Intervention(s): Limited activity within patient's tolerance    Home Living Family/patient expects to be discharged to:: Private residence Living Arrangements: Spouse/significant other(per  chart review, children currently in [place] with family) Available Help at Discharge: Family;Available PRN/intermittently(wife who is having to call out at work. No other family available) Type of Home: House(townhome) Home Access: Stairs to enter   [employer] of Steps: 2 Home Layout: Two level;Bed/bath upstairs Home Equipment: None      Prior Function Level of Independence: Independent         Comments: decreased vision in L eye and tends to keep closed.  Donned glasses with L eye patch for gait.     Hand Dominance        Extremity/Trunk Assessment   Upper Extremity Assessment Upper Extremity Assessment: Defer to OT evaluation    Lower Extremity Assessment Lower Extremity Assessment: LLE deficits/detail LLE Deficits / Details: L hip/quad 3+/5       Communication   Communication: No difficulties  Cognition Arousal/Alertness: Awake/alert Behavior During Therapy: WFL for tasks assessed/performed Overall Cognitive Status: Within Functional Limits for tasks assessed                                        General Comments      Exercises     Assessment/Plan    PT Assessment Patient needs continued PT services  PT Problem List Decreased strength;Decreased activity tolerance;Decreased balance;Decreased mobility;Decreased knowledge of use of DME       PT Treatment Interventions DME instruction;Gait training;Stair training;Functional mobility training;Therapeutic activities;Therapeutic exercise;Balance training;Neuromuscular re-education;Patient/family education    PT Goals (Current goals can be found in the Care Plan section)  Acute Rehab PT Goals Patient Stated Goal: walk PT Goal Formulation: With patient/family Time For Goal Achievement: 07/02/19 Potential to Achieve Goals: Good    Frequency Min 3X/week   Barriers to discharge Inaccessible home environment flight of stairs    Co-evaluation               AM-PAC PT "6  Clicks" Mobility  Outcome Measure Help needed turning from your back to your side while in a flat bed without using bedrails?: A Little Help needed moving from lying on your back to sitting on the side of a flat bed without using bedrails?: A Little Help needed moving to and from a bed to a chair (including a wheelchair)?: A Little Help needed standing up from a chair using your arms (e.g., wheelchair or bedside chair)?: A Little Help needed to walk in hospital room?: A Little Help needed climbing 3-5 steps with a railing? : A Lot 6 Click Score: 17    End of Session Equipment Utilized During Treatment: Gait belt Activity Tolerance: Patient tolerated treatment well Patient left: in chair;with call bell/phone within reach;with family/visitor present(wife aware that if she leaves he needs chair alarm on) Nurse Communication: Mobility status PT Visit Diagnosis: Unsteadiness on feet (R26.81);Other abnormalities of gait and mobility (R26.89)    Time: 8119-1478 PT Time Calculation (min) (ACUTE ONLY): 45 min   Charges:   PT Evaluation $PT Eval Moderate Complexity: 1 Mod PT Treatments $Gait Training: 8-22 mins $Therapeutic Activity: 8-22 mins        Moniqua Engebretsen L. Tamala Julian, [place] Pager [phone] 06/18/2019   Galen Manila 06/18/2019, 3:11  PM

## 2019-06-18 NOTE — Consult Note (Signed)
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 Short term f/u of this lesion was recommended. CXR on 05/01/2018 performed with recommended f/u imaging. 2) 05/10/2019: presented to the ED with headache and left pelvis pain. Patient underwent CT C/A/P and MRI brain. Brain MRI showed numerous enhancing parenchymal intracranial lesions likely reflecting metastases. CT scan showed cavitary lesion in the right lung apex with spiculated margins. Additional sites of disease include lymph nodes of the chest and numerous liver mets. In the bones there were multiple lytic appearing lesions throughout the thoracic and lumbar spine as well as portions of the bony pelvis. Referred to Oncology. 3) 05/16/2019: US biopsy of the liver performed. Findings consistent with a poorly differentiated adenocarcinoma.  4) 05/19/2019: establish care with Dr. Lorenso Courier    CHIEF COMPLAINTS/PURPOSE OF CONSULTATION:  "Intractable pain "  HISTORY OF PRESENTING ILLNESS:  William Ali 34 y.o. male with medical history significant for metastatic adenocarcinoma of the lung who presents for re-admission with intractable pain.   On review of the previous records Mr. Hixon was previously admitted to [facility] from 06/13/2019 until 06/16/2019 with intractable pain in his back and head as well as a newly diagnosed DVT.  At time of discharge the patient was started on Xarelto therapy and additionally was taking 4 mg dexamethasone twice daily with additional 1 to 2 tablets of 2 mg Dilaudid p.o. every 4 hours as needed.  Unfortunately this was inadequate to control the patient's pain and on 06/17/2019 the patient presented to the  emergency department noting he was having severe uncontrolled pain and that the Dilaudid was not helping.  He was admitted to [facility] and oncology was consulted for assistance in evaluation and management.  On exam today notes he returned to the hospital because he was continued to have intractable pain in the back, head, and legs.  He notes that he was trying his p.o. Dilaudid medication, with very little in the way of relief.  He does note that there has been some improvement in his cough with the Tessalon medication.  He reports that his steroids are currently down to dexamethasone 4 mg p.o. daily.  Overall he reports that the pain was simply too severe for him to continue at home and he wished to return to the hospital for better IV pain control.  He currently denies having any issues with fevers, chills, sweats, nausea, vomiting or diarrhea.  A full 10 point ROS is listed below.  MEDICAL HISTORY:  Past Medical History:  Diagnosis Date  . Back pain   . Cancer ([place])   . Hypertension     SURGICAL HISTORY: Past Surgical History:  Procedure Laterality Date  . IR IMAGING GUIDED PORT INSERTION  06/12/2019  . KNEE SURGERY      SOCIAL HISTORY: Social History   Socioeconomic History  . Marital status: Married    Spouse name: Not on file  . Number of children: Not on file  . Years of education: Not on file  . Highest education level: Not on file  Occupational History  . Not on file  Tobacco Use  . Smoking status: Never Smoker  . Smokeless tobacco: Never Used  Substance and Sexual Activity  . Alcohol  use: No  . Drug use: No  . Sexual activity: Yes  Other Topics Concern  . Not on file  Social History Narrative  . Not on file   Social Determinants of Health   Financial Resource Strain:   . Difficulty of Paying Living Expenses:   Food Insecurity:   . Worried About Charity fundraiser in the Last Year:   . Arboriculturist in the Last Year:   Transportation Needs:    . Film/video editor (Medical):   Marland Kitchen Lack of Transportation (Non-Medical):   Physical Activity:   . Days of Exercise per Week:   . Minutes of Exercise per Session:   Stress:   . Feeling of Stress :   Social Connections:   . Frequency of Communication with Friends and Family:   . Frequency of Social Gatherings with Friends and Family:   . Attends Religious Services:   . Active Member of Clubs or Organizations:   . Attends Archivist Meetings:   Marland Kitchen Marital Status:   Intimate Partner Violence:   . Fear of Current or Ex-Partner:   . Emotionally Abused:   Marland Kitchen Physically Abused:   . Sexually Abused:     FAMILY HISTORY: Family History  Problem Relation Age of Onset  . Hypertension Mother   . Diabetes Father     ALLERGIES:  is allergic to onion; other; peanut oil; and peanut-containing drug products.  MEDICATIONS:  Current Facility-Administered Medications  Medication Dose Route Frequency Provider Last Rate Last Admin  . 0.9 %  sodium chloride infusion   Intravenous Continuous Donnamae Jude, MD 75 mL/hr at 06/18/19 0925 New Bag at 06/18/19 0925  . acetaminophen (TYLENOL) tablet 650 mg  650 mg Oral Q6H PRN Donnamae Jude, MD      . albuterol (PROVENTIL) (2.5 MG/3ML) 0.083% nebulizer solution 3 mL  3 mL Inhalation Q6H PRN Donnamae Jude, MD      . alum & mag hydroxide-simeth (MAALOX/MYLANTA) 200-200-20 MG/5ML suspension 30 mL  30 mL Oral Q6H PRN Donnamae Jude, MD      . Chlorhexidine Gluconate Cloth 2 % PADS 6 each  6 each Topical Daily Donnamae Jude, MD   6 each at 06/18/19 [phone]  . chlorpheniramine-HYDROcodone (TUSSIONEX) 10-8 MG/5ML suspension 5 mL  5 mL Oral Q12H PRN Donnamae Jude, MD   5 mL at 06/18/19 0806  . dexamethasone (DECADRON) injection 6 mg  6 mg Intravenous Q6H Donnamae Jude, MD   6 mg at 06/18/19 4270  . HYDROmorphone (DILAUDID) injection 1 mg  1 mg Intravenous Q2H PRN Shelly Coss, MD   1 mg at 06/18/19 0919  . metoprolol tartrate (LOPRESSOR) tablet  12.5 mg  12.5 mg Oral BID Donnamae Jude, MD   12.5 mg at 06/18/19 0806  . ondansetron (ZOFRAN) tablet 4 mg  4 mg Oral Q6H PRN Donnamae Jude, MD       Or  . ondansetron [facility]) injection 4 mg  4 mg Intravenous Q6H PRN Donnamae Jude, MD   4 mg at 06/17/19 1926  . oxyCODONE (Oxy IR/ROXICODONE) immediate release tablet 5 mg  5 mg Oral Q3H PRN Donnamae Jude, MD      . pantoprazole (PROTONIX) EC tablet 40 mg  40 mg Oral BID Donnamae Jude, MD   40 mg at 06/18/19 0806  . polyethylene glycol (MIRALAX / GLYCOLAX) packet 17 g  17 g Oral Daily Donnamae Jude, MD  17 g at 06/18/19 0806  . Rivaroxaban (XARELTO) tablet 15 mg  15 mg Oral BID WC Donnamae Jude, MD   15 mg at 06/18/19 0806   Followed by  . [START ON 07/01/2019] rivaroxaban (XARELTO) tablet 20 mg  20 mg Oral Q supper Donnamae Jude, MD      . senna [facility]) tablet 8.6 mg  1 tablet Oral BID Donnamae Jude, MD   8.6 mg at 06/18/19 0806  . sodium chloride flush (NS) 0.9 % injection 10-40 mL  10-40 mL Intracatheter Q12H Donnamae Jude, MD   10 mL at 06/18/19 0807  . sodium chloride flush (NS) 0.9 % injection 10-40 mL  10-40 mL Intracatheter PRN Donnamae Jude, MD        REVIEW OF SYSTEMS:   Constitutional: ( - ) fevers, ( - )  chills , ( - ) night sweats Eyes: ( - ) blurriness of vision, ( - ) double vision, ( - ) watery eyes Ears, nose, mouth, throat, and face: ( - ) mucositis, ( - ) sore throat Respiratory: ( - ) cough, ( - ) dyspnea, ( - ) wheezes Cardiovascular: ( - ) palpitation, ( - ) chest discomfort, ( - ) lower extremity swelling Gastrointestinal:  ( - ) nausea, ( - ) heartburn, ( - ) change in bowel habits Skin: ( - ) abnormal skin rashes Lymphatics: ( - ) new lymphadenopathy, ( - ) easy bruising Neurological: ( - ) numbness, ( - ) tingling, ( - ) new weaknesses Behavioral/Psych: ( - ) mood change, ( - ) new changes  All other systems were reviewed with the patient and are negative.  PHYSICAL EXAMINATION: ECOG PERFORMANCE  STATUS: 1 - Symptomatic but completely ambulatory  Vitals:   06/18/19 0043 06/18/19 0649  BP: (!) 145/86 (!) 149/82  Pulse: (!) 104 97  Resp: 18 16  Temp: 98 F (36.7 C) 99 F (37.2 C)  SpO2: 99% 99%   Filed Weights   06/17/19 1821  Weight: 218 lb 4.1 oz (99 kg)    GENERAL: well appearing young African American male in NAD  SKIN: skin color, texture, turgor are normal, no rashes or significant lesions EYES: conjunctiva are pink and non-injected, sclera clear LUNGS: clear to auscultation and percussion with normal breathing effort HEART: regular rate & rhythm and no murmurs and no lower extremity edema Musculoskeletal: no cyanosis of digits and no clubbing  PSYCH: alert & oriented x 3, fluent speech NEURO: no focal motor/sensory deficits  LABORATORY DATA:  I have reviewed the data as listed CBC Latest Ref Rng & Units 06/18/2019 06/17/2019 06/16/2019  WBC 4.0 - 10.5 K/uL 7.0 6.6 5.8  Hemoglobin 13.0 - 17.0 g/dL 11.0(L) 11.0(L) 10.4(L)  Hematocrit 39.0 - 52.0 % 35.3(L) 34.6(L) 32.9(L)  Platelets 150 - 400 K/uL 114(L) 113(L) 97(L)    CMP Latest Ref Rng & Units 06/18/2019 06/17/2019 06/16/2019  Glucose 70 - 99 mg/dL 224(H) 138(H) 160(H)  BUN 6 - 20 mg/dL 22(H) 22(H) 22(H)  Creatinine 0.61 - 1.24 mg/dL 1.43(H) 1.22 1.10  Sodium 135 - 145 mmol/L 140 139 137  Potassium 3.5 - 5.1 mmol/L 4.7 3.6 4.3  Chloride 98 - 111 mmol/L 105 102 105  CO2 22 - 32 mmol/L 25 25 25   Calcium 8.9 - 10.3 mg/dL 8.9 8.7(L) 8.6(L)  Total Protein 6.5 - 8.1 g/dL 6.1(L) - -  Total Bilirubin 0.3 - 1.2 mg/dL 0.6 - -  Alkaline Phos 38 - 126 U/L 328(H) - -  AST 15 - 41 U/L 146(H) - -  ALT 0 - 44 U/L 355(H) - -     PATHOLOGY: CASE: [id number]  PATIENT: Tkai Cohoon  Surgical Pathology Report   Clinical History: malignant neoplasm metastatic to intrathoracic lymph  node ([place]) (cm)    FINAL MICROSCOPIC DIAGNOSIS:   A. LIVER, NEEDLE CORE BIOPSY:  - Poorly differentiated adenocarcinoma.  - See  comment.   COMMENT:   The malignant cells are positive for cytokeratin 7.  They are negative  for CDX2, cytokeratin 20, hepar 1, arginase, glypican, and CD34.  A  reticulin stain reveals a reticulin framework. 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  Dr. Vic Ripper has reviewed  the case and concurs with this interpretation.  There is sufficient  tumor present for additional studies, if requested.   GROSS DESCRIPTION:   Received in formalin are 3 cores of tan-pink soft tissue ranging from  1.5 to 1.8 cm, submitted in 2 cassettes.  (AK 05/16/2019)   Final Diagnosis performed by Enid Cutter, MD.   Electronically signed  05/18/2019  Technical and / or Professional components performed at [facility]. [facility], [place] [address], [place], [place] [address].   Immunohistochemistry Technical component (if applicable) was performed  at [facility]. [address], [place],  [place], [place] [address].   IMMUNOHISTOCHEMISTRY DISCLAIMER (if applicable):  Some of these immunohistochemical stains may have been developed and the  performance characteristics determine by [facility]. Some  may not have been cleared or approved by the U.S. Food and Drug  Administration. The FDA has determined that such clearance or approval  is not necessary. This test is used for clinical purposes. It should not  be regarded as investigational or for research. This laboratory is  certified under the [facility] [place]  (CLIA-88) as qualified to perform high complexity clinical laboratory  testing.  The controls stained appropriately.    RADIOGRAPHIC STUDIES: DG Chest 2 View  Result Date: 06/17/2019 CLINICAL DATA:  Chest pain, history of prior PE EXAM: CHEST - 2 VIEW COMPARISON:  06/13/2019 FINDINGS: Cardiac shadow is within normal limits. Left lung is clear.  Fullness in the right hilum and suprahilar region is noted with volume loss in the upper lobe consistent with the known mass lesion. Right chest wall port is seen in satisfactory position. IMPRESSION: Increasing fullness in the right hilar and suprahilar region with volume loss in the upper lobe slightly progressed when compared with the prior exam. Electronically Signed   By: Inez Catalina M.D.   On: 06/17/2019 14:58   DG Tibia/Fibula Left  Result Date: 06/04/2019 CLINICAL DATA:  Leg pain EXAM: LEFT TIBIA AND FIBULA - 2 VIEW COMPARISON:  None. FINDINGS: No fracture of the tibia or fibula. Knee joint and ankle joint appear normal on two views. The lateral tibial plateau appears irregular however on the comparison femur exam same day the lateral tibial plateau appears normal. IMPRESSION: No fracture or dislocation. No aggressive osseous lesion identified. Electronically Signed   By: Suzy Bouchard M.D.   On: 06/04/2019 09:00   CT HEAD WO CONTRAST  Result Date: 06/17/2019 CLINICAL DATA:  Known brain metastases. Worsening headache. Patient recently started anticoagulation. EXAM: CT HEAD WITHOUT CONTRAST CT CERVICAL SPINE WITHOUT CONTRAST TECHNIQUE: Multidetector CT imaging of the head and cervical spine was performed following the standard protocol without intravenous contrast. Multiplanar CT image reconstructions of the cervical  spine were also generated. COMPARISON:  Jun 04, 2019 CT scan of the brain. Brain MRI May 10, 2019. FINDINGS: CT HEAD FINDINGS Brain: No subdural, epidural, or subarachnoid hemorrhage. Most of the patient's known brain metastases are not visualized on this unenhanced CT scan. There is suggestion of a few metastases, very subtle. No acute cortical ischemia or infarct. Cerebellum, brainstem, and basal cisterns are unremarkable. No midline shift or mass effect identified. Vascular: No hyperdense vessel or unexpected calcification. Skull: Suspected calvarial metastases seen on MRI are not  well appreciated on this study. Sinuses/Orbits: Minimal fluid in the posterior right mastoid air cells without bony erosion. There is opacification of the sphenoid sinuses which is worsened in the interval. 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 CT CERVICAL SPINE FINDINGS Alignment: Normal. Skull base and vertebrae: No fractures identified. Relative lucency in the left lamina and pedicle at C2, extending into the spinous process with periosteal reaction. No other discrete bony lesions in the cervical spine. A lucency in the right mandibular head is well-circumscribed. Soft tissues and spinal canal: No prevertebral fluid or swelling. No visible canal hematoma. Disc levels:  Mild multilevel degenerative disc disease. Upper chest: Only the superior most aspect of the right apical mass is seen on this CT. Several small nodules in the left apex are better assessed on today's CT scan of the chest. Other: No other abnormalities. IMPRESSION: 1. 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 There is suggestion of some of the metastases, very subtle. No midline shift. Basal cisterns are patent. 2. 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 3. Suspected calvarial metastases on the previous MRI are not well seen on this study. 4. No fracture or malalignment in the cervical spine. Lucency in the left C2 lamina and pedicle extending into the spinous process with periosteal reaction suggest metastatic disease in this region. No other definitive bony metastatic disease in the cervical spine. 5. There is a rounded lucency in the right mandibular head which is nonspecific. 6. Nodularity in the lung apices. See the CT scan of the chest for better evaluation of this region. Electronically Signed   By: Dorise Bullion III M.D   On: 06/17/2019 16:37   CT Head Wo  Contrast  Result Date: 06/04/2019 CLINICAL DATA:  Worsening headaches. Additional history provided: Recently diagnosed with metastatic lung cancer. EXAM: CT HEAD WITHOUT CONTRAST TECHNIQUE: Contiguous axial images were obtained from the base of the skull through the vertex without intravenous contrast. COMPARISON:  Brain MRI 05/10/2019, head CT 09/13/2012 FINDINGS: Brain: Numerous small enhancing parenchymal lesions were demonstrated on prior MRI 05/10/2019. Some of these are appreciated on today's examination but many of these are occult by CT. Several of these lesions were hemorrhagic on the prior MRI. There is a 3 mm focus of hyperdensity within the left occipital lobe consistent with redemonstrated hemorrhage associated with a lesion at this site (series 2, image 17). No definitively new lesion is identified. No demarcated cortical infarct. No extra-axial fluid collection. No evidence of intracranial mass. No midline shift. Vascular: No hyperdense vessel. Skull: No calvarial fracture. Suspected metastases within the skull base and upper cervical spine were better appreciated on prior MRI. Sinuses/Orbits: No acute orbital abnormality by CT. Please note subtle enhancement along the posterior left globe with suspected on prior MRI. Ethmoid and sphenoid sinus mucosal thickening. Right  sphenoid sinus air-fluid level. No significant mastoid effusion IMPRESSION: Numerous small parenchymal lesions (likely metastases) were demonstrated on prior MRI 05/10/2019. Some of these lesions are appreciated on today's examination, but many of these are occult by CT. There is a 3 mm focus of hyperdensity consistent with hemorrhage at site of a known left occipital lobe lesion. However, this is unchanged and hemorrhage was present at this site on prior MRI. No definitively new lesion or interval acute intracranial abnormality is identified. Suspected metastases within the skull base and upper cervical spine were better appreciated  on prior MRI. Paranasal sinus mucosal thickening with right sphenoid sinus air-fluid level. Correlate for acute sinusitis. Electronically Signed   By: Kellie Simmering DO   On: 06/04/2019 08:44   CT ANGIO CHEST PE W OR WO CONTRAST  Result Date: 06/17/2019 CLINICAL DATA:  Recently diagnosed metastatic lung cancer, worsening shortness of breath, chest pain, dyspnea EXAM: CT ANGIOGRAPHY CHEST WITH CONTRAST TECHNIQUE: Multidetector CT imaging of the chest was performed using the standard protocol during bolus administration of intravenous contrast. Multiplanar CT image reconstructions and MIPs were obtained to evaluate the vascular anatomy. CONTRAST:  149mL OMNIPAQUE IOHEXOL 350 MG/ML SOLN COMPARISON:  06/13/2019 FINDINGS: Cardiovascular: This is a technically adequate evaluation of the pulmonary vasculature. The chronic right lower lobe pulmonary emboli seen previously are again noted and unchanged. There are no acute pulmonary emboli. The right hilar mass results in extrinsic compression of the right upper lobe pulmonary artery, unchanged since prior exam. No pericardial effusion.  The thoracic aorta remains unremarkable. Mediastinum/Nodes: Mediastinal and right hilar adenopathy unchanged since prior exam. Index lymph node in the right paratracheal region image 22 measures 11 mm in short axis, stable since previous study. Right hilar mass measures 2.5 cm reference image 37, previously measuring approximately 2.2 cm. No new adenopathy. The thyroid, trachea, and esophagus are grossly normal. Lungs/Pleura: Thick-walled cavitary right upper lobe mass is again noted, measuring approximately 2.8 x 3.6 cm reference image 23, previously measuring 3.4 x 2.2 cm by my measurement. Findings are consistent with primary malignancy. Progressive wedge-shaped consolidation within the right upper lobe likely reflects pulmonary infarct given the extrinsic compression on the right upper lobe pulmonary artery by the infiltrative right  hilar mass. Subcentimeter nodule left upper lobe image 28 and superior segment left lower lobe image 39 unchanged. There are trace bilateral pleural effusions right greater than left. No pneumothorax. There is narrowing of the right upper lobe bronchus and segmental airways due to the infiltrative right hilar mass described above, similar to previous study. Upper Abdomen: Numerous liver metastases are noted, please refer to CT abdomen report. Musculoskeletal: Mottled appearance of the thoracic spine consistent with bony metastases, stable since prior study. Pathologic T3 fracture is seen with mild retropulsion resulting in at least 50% narrowing of the central canal. There is likely an associated soft tissue component. No other pathologic fractures. Review of the MIP images confirms the above findings. IMPRESSION: 1. 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 No acute pulmonary embolus. 2. Cavitating right upper lobe mass consistent with lung cancer. Minimal increase in size. 3. Marked right hilar and mediastinal adenopathy as above, without significant change. There is extrinsic compression upon the right upper lobe pulmonary artery and narrowing of the right upper lobe bronchus due to the right hilar mass, stable. 4. Progressive right upper lobe pulmonary infarct due to the extrinsic compression on the right upper lobe pulmonary artery. 5. Diffuse bony metastases. Pathologic T3 fracture with resulting central canal  narrowing as above. Please refer to CT thoracic spine report. 6. Trace bilateral pleural effusions. 7. Liver metastases.  Please refer to abdominal CT report. Electronically Signed   By: Randa Ngo M.D.   On: 06/17/2019 16:30   CT Angio Chest PE W and/or Wo Contrast  Result Date: 06/13/2019 CLINICAL DATA:  Shortness of breath. Chest pain. Metastatic lung carcinoma. Active chemotherapy and radiation. EXAM: CT ANGIOGRAPHY CHEST WITH CONTRAST TECHNIQUE: Multidetector CT imaging of the  chest was performed using the standard protocol during bolus administration of intravenous contrast. Multiplanar CT image reconstructions and MIPs were obtained to evaluate the vascular anatomy. CONTRAST:  148mL OMNIPAQUE IOHEXOL 350 MG/ML SOLN COMPARISON:  Chest radiograph earlier this day. Most recent chest CT 05/10/2019 FINDINGS: Cardiovascular: Right hilar adenopathy/soft tissue density causes severe narrowing of the right upper lobe pulmonary arteries which are attenuated. This causes mass effect on the distal main right pulmonary artery. Circumferential soft tissue density involving the right lower lobar pulmonary artery. Filling defects within the segmental branches of the right lower lobe pulmonary arteries are eccentric and may be due to direct invasion or de novo pulmonary embolus, for example series 6, image 145 and 149. No definite pulmonary emboli on the left, however breathing motion artifact and contrast bolus timing limits detailed assessment. Thoracic aorta is normal in caliber without dissection. Right chest port tip in the lower SVC. Heart is normal in size. No pericardial effusion. Mediastinum/Nodes: Progressive right hilar adenopathy/soft tissue density causes severe narrowing of the right upper lobe pulmonary arteries. This is increased from prior exam. Representative right hilar lymph node measures 22 mm, series 5, image 59, previously 19 mm. This is contiguous with right paratracheal soft tissue densities/adenopathy which is difficult to delineate. Right lower paratracheal node has increased currently 13 mm, series 5, image 52, previously 10 mm. High mediastinal node measures 11 mm, series 5, image 37, unchanged. No left hilar adenopathy. Esophagus slightly patulous without wall thickening. No visualized thyroid nodule. Lungs/Pleura: Spiculated right upper lobe cavitary nodule currently measuring approximately 3 x 2.6 cm with decreased peripheral soft tissue component from prior exam. This is  contiguous with soft tissue density that extends to the right hilum. Severe narrowing of the right upper lobe bronchus. Ill-defined paramediastinal density in the right upper lobe is likely post treatment related change. Peripheral subpleural opacity in the right upper lobe abutting, series 7, image 57 is nonspecific, and may represent pulmonary infarct, post treatment related change, or pneumonia. Small left apical nodule series 7, image 39 and 40 are unchanged. Small nodule in the superior segment of the left lower lobe is unchanged, series 7, image 55. 3 mm perifissural nodule in the right middle lobe, series 7, image 64, unchanged. No definite new nodule. No pleural fluid. Upper Abdomen: Known metastatic disease in the liver, not as well visualized on the current exam given phase of contrast. No acute abdominal findings. 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 T3 lesion causing mild pathologic compression fracture, similar to prior. No new spinal compression fractures. Review of the MIP images confirms the above findings. IMPRESSION: 1. Progressive right hilar adenopathy/soft tissue density which causes severe narrowing of the right upper lobe pulmonary arteries which are attenuated. Small segmental pulmonary emboli in the right lower lobe are adjacent to adenopathy, may represent direct invasion versus did no novo clot. Thromboembolic burden is small. No pulmonary embolus on the left, however breathing motion artifact and contrast bolus timing limits detailed assessment. 2. Spiculated right upper lobe  cavitary nodule with decreased peripheral soft tissue component from prior exam and slight decrease in size. 3. Progressive right hilar adenopathy/soft tissue density causes severe narrowing of the right upper lobe bronchus. There may be an element of post treatment related change, however some of the mediastinal nodes have increased in size suspicious for progression in disease. 4.  Additional small pulmonary nodules both lungs are unchanged from exam last month. 5. Osseous metastatic disease is grossly unchanged. Known hepatic metastatic disease is not well delineated given phase of contrast These results were called by telephone at the time of interpretation on 06/13/2019 at 1:52 pm to Theresa , who verbally acknowledged these results. Electronically Signed   By: Keith Rake M.D.   On: 06/13/2019 13:53   CT CERVICAL SPINE WO CONTRAST  Result Date: 06/17/2019 CLINICAL DATA:  Known brain metastases. Worsening headache. Patient recently started anticoagulation. EXAM: CT HEAD WITHOUT CONTRAST CT CERVICAL SPINE WITHOUT CONTRAST TECHNIQUE: Multidetector CT imaging of the head and cervical spine was performed following the standard protocol without intravenous contrast. Multiplanar CT image reconstructions of the cervical spine were also generated. COMPARISON:  Jun 04, 2019 CT scan of the brain. Brain MRI May 10, 2019. FINDINGS: CT HEAD FINDINGS Brain: No subdural, epidural, or subarachnoid hemorrhage. Most of the patient's known brain metastases are not visualized on this unenhanced CT scan. There is suggestion of a few metastases, very subtle. No acute cortical ischemia or infarct. Cerebellum, brainstem, and basal cisterns are unremarkable. No midline shift or mass effect identified. Vascular: No hyperdense vessel or unexpected calcification. Skull: Suspected calvarial metastases seen on MRI are not well appreciated on this study. Sinuses/Orbits: Minimal fluid in the posterior right mastoid air cells without bony erosion. There is opacification of the sphenoid sinuses which is worsened in the interval. 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 CT CERVICAL SPINE FINDINGS Alignment: Normal. Skull base and vertebrae: No fractures identified. Relative lucency in the left lamina and pedicle at C2,  extending into the spinous process with periosteal reaction. No other discrete bony lesions in the cervical spine. A lucency in the right mandibular head is well-circumscribed. Soft tissues and spinal canal: No prevertebral fluid or swelling. No visible canal hematoma. Disc levels:  Mild multilevel degenerative disc disease. Upper chest: Only the superior most aspect of the right apical mass is seen on this CT. Several small nodules in the left apex are better assessed on today's CT scan of the chest. Other: No other abnormalities. IMPRESSION: 1. 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 There is suggestion of some of the metastases, very subtle. No midline shift. Basal cisterns are patent. 2. 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 3. Suspected calvarial metastases on the previous MRI are not well seen on this study. 4. No fracture or malalignment in the cervical spine. Lucency in the left C2 lamina and pedicle extending into the spinous process with periosteal reaction suggest metastatic disease in this region. No other definitive bony metastatic disease in the cervical spine. 5. There is a rounded lucency in the right mandibular head which is nonspecific. 6. Nodularity in the lung apices. See the CT scan of the chest for better evaluation of this region. Electronically Signed   By: Dorise Bullion III M.D   On: 06/17/2019 16:37   MR Cervical Spine Wo Contrast  Result Date: 06/17/2019 CLINICAL DATA:  34 year old male with widely metastatic lung cancer. Back pain. EXAM: MRI CERVICAL SPINE WITHOUT CONTRAST TECHNIQUE: Multiplanar, multisequence MR imaging of the cervical spine was performed. No intravenous contrast was administered. COMPARISON:  CT Thoracic Spine, Lumbar Spine, CT Chest, Abdomen, and Pelvis yesterday. Brain MRI 05/10/2019. FINDINGS: Alignment: Overall straightening of cervical lordosis. Vertebrae:  Metastatic disease to bone at virtually all cervical spine levels, and throughout the visible skull base. Only the C1 vertebra seems to be spared at this time. Anterior and posterior element metastases in the cervical spine. No pathologic cervical vertebral fracture, although there is epidural and extraosseous extension of tumor from the left C2 posterior elements (series 13, image 4) with severe involvement of the left C3 neural foramen. No significant cervical spinal stenosis. See thoracic spine findings reported separately. Cord: Spinal cord signal is within normal limits at all visualized levels. No IV contrast administered. Posterior Fossa, vertebral arteries, paraspinal tissues: Cervicomedullary junction is within normal limits. Abnormal T2 and STIR hyperintensity in the visible right cerebellar hemisphere compatible with known brain metastases. No posterior fossa mass effect is evident. Preserved major vascular flow voids in the neck. There is inflammation of the C2-C3 level posterior paraspinal muscles greater on the left which is probably related to the C2 extraosseous tumor. Disc levels: Incidental degenerative changes including: C3-C4: Small leftward disc herniation with mild spinal stenosis and mild spinal cord mass effect. C4-C5: Disc bulge and small rightward disc herniation with mild spinal stenosis and mild right hemi cord mass effect. Associated moderate to severe right C5 foraminal stenosis. IMPRESSION: 1. Widespread osseous metastatic disease throughout the cervical spine and the visible skull base. 2. Epidural and extraosseous extension of tumor from the left C2 posterior elements with severe involvement of the left C3 neural foramen, but no malignant spinal stenosis. There is mild degenerative cervical spinal stenosis at C3-C4 and C4-C5. 3. No pathologic cervical vertebral fracture. 4. See Thoracic Spine findings reported separately. Electronically Signed   By: Genevie Ann M.D.   On: 06/17/2019  21:59   MR THORACIC SPINE WO CONTRAST  Result Date: 06/17/2019 CLINICAL DATA:  34 year old male with widely metastatic lung cancer. Back pain. EXAM: MRI THORACIC SPINE WITHOUT CONTRAST TECHNIQUE: Multiplanar, multisequence MR imaging of the thoracic spine was performed. No intravenous contrast was administered. COMPARISON:  Cervical MRI today reported separately. Thoracic spine CT, CT Chest, Abdomen, and Pelvis yesterday. FINDINGS: Limited cervical spine imaging:  Reported separately today. Thoracic spine segmentation:  Normal. Alignment:  Straightening of thoracic kyphosis. Vertebrae: Thoracic vertebral metastases at every level. Subtotal T1 vertebral replacement by tumor. Early extraosseous extension from the spinous process into the paraspinal muscles. T3 vertebral body pathologic compression fracture with 50% central loss of vertebral body height, and some retropulsed bone/tumor. Additionally, there is evidence of some epidural tumor extension at that level (series 34, image 15) resulting in mild spinal stenosis, borderline to mild cord compression. Moderate left T3 neural foraminal stenosis. Epidural tumor also at the T5 level where the vertebra is completely replaced by tumor. Abundant extraosseous extension of tumor also from the posterior elements into the posterior paraspinal musculature. Mild spinal stenosis and borderline to mild cord compression. T6 near complete vertebral replacement by tumor with ventral and foraminal epidural tumor extension. When superimposed on epidural lipomatosis there is mild spinal stenosis and cord compression (series 34, image 28). Moderate to severe right T6 foraminal involvement. Ventral epidural tumor extension at T7 without cord compression at this time (series 34, image  32). Ventral epidural tumor extension at T9 with mild spinal stenosis and borderline to mild cord compression at this time (image 42). T10 right side posterior element extraosseous extension of tumor  into the paraspinal muscles. Cord: No thoracic cord signal abnormality despite the multiple levels of epidural tumor. Paraspinal and other soft tissues: Abnormal right upper lung and mediastinum. Small layering right pleural effusion. Partially visible liver metastases. Disc levels: There are some superimposed thoracic degenerative changes, including: -Mild degenerative thoracic spinal stenosis at T8-T9 related to left eccentric disc and endplate degeneration, mild to moderate posterior element degeneration. -borderline to mild degenerative spinal stenosis at T11-T12 related to right paracentral disc protrusion and mild facet hypertrophy. IMPRESSION: 1. Bone metastases involving every thoracic spine level. Pathologic fracture of T3 with up to 50% central vertebral loss of height, mild retropulsion of bone in tumor. 2. Ventral epidural tumor with up to mild spinal stenosis and borderline to mild cord compression at: T3, T5, T6, T7, T9. 3. Moderate or severe neural foraminal involvement by tumor at the left T3, right T6 nerve levels. 4. Superimposed mild degenerative thoracic spinal stenosis at T8-T9 and T11-T12. Electronically Signed   By: Genevie Ann M.D.   On: 06/17/2019 22:09   MR LUMBAR SPINE WO CONTRAST  Result Date: 06/17/2019 CLINICAL DATA:  34 year old male with widely metastatic lung cancer. Back pain. EXAM: MRI LUMBAR SPINE WITHOUT CONTRAST TECHNIQUE: Multiplanar, multisequence MR imaging of the lumbar spine was performed. No intravenous contrast was administered. COMPARISON:  Thoracic spine MRI today. Lumbar CT, CT Chest, Abdomen, and Pelvis yesterday. Lumbar MRI 05/10/2019. FINDINGS: Segmentation: Normal, concordant with the thoracic spine numbering today. Alignment:  Stable lumbar lordosis since April. Vertebrae: Lumbar metastatic disease at all levels. Compared to 05/10/2019 lumbar vertebral tumor shows mild generalized progression. Confluent sacral and medial iliac bone metastatic disease  redemonstrated. Mild pathologic fracture of the L2 superior endplate. This has only mildly progressed since April (series 38, image 9). No other lumbar pathologic fracture. And although there is some early lumbar extraosseous tumor extension (right L4 transverse process series 39, image 25), there is no significant lumbar epidural or neural foraminal tumor at this time. However, there is obliteration of the right S1, left S2 and S3 neural foramina by tumor. Conus medullaris and cauda equina: Conus extends to the T12-L1 level. No lower spinal cord or conus signal abnormality. Cauda equina nerve roots appear normal in the absence of contrast. Paraspinal and other soft tissues: Stable visible abdominal viscera. Disc levels: Lumbar epidural lipomatosis. Superimposed lumbar degenerative changes most notable for: -L3-L4 left paracentral disc protrusion superimposed on disc bulge, posterior element hypertrophy and epidural lipomatosis. Moderate left lateral recess stenosis (left L4 nerve level) and mild spinal stenosis. IMPRESSION: 1. Lumbar metastatic disease at all levels with mild generalized progression since the April MRI. Mild pathologic fracture of the L2 superior endplate. 2. Occasional early extraosseous extension of tumor (right L4 transverse process), but no lumbar epidural or neural foraminal tumor at this time. 3. Bulky sacral tumor re-demonstrated including severe involvement of the right S1, left S2, and left S3 neural foramina. Electronically Signed   By: Genevie Ann M.D.   On: 06/17/2019 22:16   CT ABDOMEN PELVIS W CONTRAST  Result Date: 06/17/2019 CLINICAL DATA:  Metastatic lung cancer, chest pain, lower and mid back pain EXAM: CT ABDOMEN AND PELVIS WITH CONTRAST TECHNIQUE: Multidetector CT imaging of the abdomen and pelvis was performed using the standard protocol following bolus administration of intravenous contrast. CONTRAST:  188mL  OMNIPAQUE IOHEXOL 350 MG/ML SOLN COMPARISON:  05/10/2019 FINDINGS:  Lower chest: Please refer to corresponding chest CT report describing right upper lobe malignancy, right hilar and mediastinal adenopathy, and chronic right lower lobe pulmonary emboli. Hepatobiliary: Innumerable hepatic metastases are again noted, increased in size. Largest index lesion within the caudate measures 4.9 cm image 18, previously measuring 2.8 cm image 47. No biliary dilation.  Gallbladder is unremarkable. Pancreas: Unremarkable. No pancreatic ductal dilatation or surrounding inflammatory changes. Spleen: Normal in size without focal abnormality. Adrenals/Urinary Tract: Adrenal glands are unremarkable. Kidneys are normal, without renal calculi, focal lesion, or hydronephrosis. Bladder is unremarkable. Stomach/Bowel: No bowel obstruction or ileus. No bowel wall thickening or inflammatory changes. Vascular/Lymphatic: No pathologic adenopathy within the abdomen or pelvis. Vascular structures are unremarkable. Reproductive: Prostate is unremarkable. Other: No free fluid or free gas.  No abdominal wall hernia. Musculoskeletal: Progressive diffuse bony metastases are seen throughout the bilateral hips, lumbar spine, sacrum, and bony pelvis. Please refer to dedicated CT lumbar spine describing findings related to the lumbar spine and sacrum. Increased size of the expansile left iliac crest lesion with associated soft tissue component. There are no pathologic fractures. IMPRESSION: 1. Progressive liver metastases as above. 2. Progressive bony metastases involving the lumbar spine, bony pelvis, and sacrum. Please refer to corresponding CT lumbar spine report for important findings in that region. Electronically Signed   By: Randa Ngo M.D.   On: 06/17/2019 16:46   CT T-SPINE NO CHARGE  Result Date: 06/17/2019 CLINICAL DATA:  Metastatic lung cancer, back pain EXAM: CT THORACIC SPINE WITHOUT CONTRAST TECHNIQUE: Multidetector CT images of the thoracic were obtained using the standard protocol without  intravenous contrast. COMPARISON:  06/13/2019 FINDINGS: Alignment: Alignment is anatomic. Vertebrae: Diffuse metastatic disease is seen throughout the entirety of the thoracic spine. There is moderate appearance and destruction of the T3 vertebral body with pathologic fracture centrally with 50% loss of vertebral body height. Paraspinal and other soft tissues: Please refer to corresponding chest CT report describing a cavitating right upper lobe mass, right hilar mass, and diffuse mediastinal adenopathy. Trace bilateral pleural effusions are noted. There are chronic right lower lobe segmental pulmonary emboli. Disc levels: As result of the T3 pathologic fracture and metastatic disease described above, there is approximately 50% narrowing of the central canal at T3. At T8 and T9, there are expansile metastatic lesions resulting in bowing of the posterior margin of the vertebral bodies with estimated 50-75% narrowing of the central canal and likely cord compression. IMPRESSION: 1. Diffuse bony metastases throughout the entirety of the thoracic spine, most pronounced at T3, T8, and T9 with resulting central canal narrowing and likely cord compression. 2. Pathologic fracture of the T3 vertebral body, with 50% loss of height centrally. 3. Please refer to CT chest report describing cavitating right upper lobe mass, right hilar mass, and mediastinal adenopathy. Electronically Signed   By: Randa Ngo M.D.   On: 06/17/2019 16:40   CT L-SPINE NO CHARGE  Result Date: 06/17/2019 CLINICAL DATA:  Low back pain, metastatic lung cancer EXAM: CT LUMBAR SPINE WITHOUT CONTRAST TECHNIQUE: Multidetector CT imaging of the lumbar spine was performed without intravenous contrast administration. Multiplanar CT image reconstructions were also generated. COMPARISON:  06/13/2019 FINDINGS: Segmentation: There are 5 non-rib-bearing lumbar type vertebral bodies. Alignment: Alignment is anatomic. Vertebrae: There is diffuse metastatic  disease, with multiple lytic lesions throughout the lumbar spine. These are most pronounced within the left aspect of the L1 vertebral body extending into the left pedicle,  anterior aspect of the L2 vertebral body, right L3 transverse process and central aspect L3 vertebral body, right L4 pedicle and transverse process. Additional lytic lesions are seen within the bilateral iliac bones and sacrum. Bony destruction and soft tissue mass obliterate the left S2/S3 and S3/S4 neural foramen. I do not see any pathologic fracture within the lumbar spine. Paraspinal and other soft tissues: Soft tissue mass results in obliteration of the left S2/S3 and S3/S4 neural foramen. Likely soft tissue component arising from the L1 metastatic lesion with slight enlargement of the left psoas muscle. Disc levels: There is multilevel lumbar spondylosis. Mild central canal stenosis is seen at L1/L2, L2/L3, and L3/L4. At L3/L4 there is left predominant lateral recess and neural foraminal encroachment as well. IMPRESSION: 1. Diffuse bony metastatic disease throughout the lumbar spine, sacrum, and bilateral iliac bones as above. 2. There are no pathologic fractures. Soft tissue mass and bony destruction obliterate the left S2/S3 and S3/S4 neural foramen. 3. Multilevel lumbar spondylosis most pronounced at L3/L4 with left predominant neural foraminal and lateral recess encroachment. Electronically Signed   By: Randa Ngo M.D.   On: 06/17/2019 16:35   DG Chest Portable 1 View  Result Date: 06/13/2019 CLINICAL DATA:  Shortness of breath and chest pain EXAM: PORTABLE CHEST 1 VIEW COMPARISON:  Jun 02, 2019 FINDINGS: Port-A-Cath tip is in the superior vena cava. No pneumothorax. Lungs are clear. Heart size and pulmonary vascularity are normal. No adenopathy. No bone lesions. IMPRESSION: Port-A-Cath tip in superior vena cava. No pneumothorax. Lungs clear. Cardiac silhouette within normal limits. Electronically Signed   By: Lowella Grip  III M.D.   On: 06/13/2019 10:54   DG Chest Portable 1 View  Result Date: 06/02/2019 CLINICAL DATA:  Hervey Ard right-sided chest pain EXAM: PORTABLE CHEST 1 VIEW COMPARISON:  May 06, 2019 FINDINGS: The previously demonstrated right upper lobe pulmonary nodule is better visualized on the patient's prior CT chest and prior x-rays. There is no pneumothorax. No significant pleural effusion. No evidence for an acute osseous abnormality. The heart size is normal. There is some mild thickening of the right paratracheal stripe which may be secondary to underlying mediastinal adenopathy. IMPRESSION: 1. Mild thickening of the right paratracheal stripe which may be secondary to underlying mediastinal adenopathy. 2. The previously demonstrated right upper lobe pulmonary nodule is better visualized on the patient's prior CT chest and prior x-rays. 3. No acute cardiopulmonary process. Electronically Signed   By: Constance Holster M.D.   On: 06/02/2019 17:55   DG Femur Min 2 Views Left  Result Date: 06/04/2019 CLINICAL DATA:  Lower extremity pain. EXAM: LEFT FEMUR 2 VIEWS COMPARISON:  None. FINDINGS: Frontal and lateral views were obtained. No fracture or dislocation. No abnormal periosteal reaction. No blastic or lytic bone lesions. There is moderate degenerative change in the knee joint. No appreciable knee joint effusion. IMPRESSION: No blastic or lytic bone lesions. No abnormal periosteal reaction. No fracture or dislocation. 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 Electronically Signed   By: Lowella Grip III M.D.   On: 06/04/2019 08:55   IR IMAGING GUIDED PORT INSERTION  Result Date: 06/12/2019 INDICATION: 34 year old male with a history of metastatic right upper lobe lung cancer. He presents for port catheter placement. EXAM: IMPLANTED PORT A CATH PLACEMENT WITH ULTRASOUND AND FLUOROSCOPIC GUIDANCE MEDICATIONS: Ancef 2 g; The antibiotic was administered within an appropriate time interval prior to skin  puncture. ANESTHESIA/SEDATION: Versed 4 mg IV; Fentanyl 100 mcg IV; Moderate Sedation Time:  19  minutes The patient was continuously monitored during the procedure by the interventional radiology nurse under my direct supervision. FLUOROSCOPY TIME:  0 minutes, 30 seconds (7 mGy) COMPLICATIONS: None immediate. PROCEDURE: The right neck and chest was prepped with chlorhexidine, and draped in the usual sterile fashion using maximum barrier technique (cap and mask, sterile gown, sterile gloves, large sterile sheet, hand hygiene and cutaneous antiseptic). Local anesthesia was attained by infiltration with 1% lidocaine with epinephrine. Ultrasound demonstrated patency of the right internal jugular vein, and this was documented with an image. Under real-time ultrasound guidance, this vein was accessed with a 21 gauge micropuncture needle and image documentation was performed. A small dermatotomy was made at the access site with an 11 scalpel. A 0.018" wire was advanced into the SVC and the access needle exchanged for a 26F micropuncture vascular sheath. The 0.018" wire was then removed and a 0.035" wire advanced into the IVC. An appropriate location for the subcutaneous reservoir was selected below the clavicle and an incision was made through the skin and underlying soft tissues. The subcutaneous tissues were then dissected using a combination of blunt and sharp surgical technique and a pocket was formed. A single lumen power injectable portacatheter was then tunneled through the subcutaneous tissues from the pocket to the dermatotomy and the port reservoir placed within the subcutaneous pocket. The venous access site was then serially dilated and a peel away vascular sheath placed over the wire. The wire was removed and the port catheter advanced into position under fluoroscopic guidance. The catheter tip is positioned in the superior cavoatrial junction. This was documented with a spot image. The portacatheter was then  tested and found to flush and aspirate well. The port was flushed with saline followed by 100 units/mL heparinized saline. The pocket was then closed in two layers using first subdermal inverted interrupted absorbable sutures followed by a running subcuticular suture. The epidermis was then sealed with Dermabond. The dermatotomy at the venous access site was also closed with Dermabond. IMPRESSION: Successful placement of a right IJ approach Power Port with ultrasound and fluoroscopic guidance. The catheter is ready for use. Electronically Signed   By: Jacqulynn Cadet M.D.   On: 06/12/2019 16:26   VAS [place] LOWER EXTREMITY VENOUS (DVT) (ONLY [facility] & [facility])  Result Date: 06/04/2019  Lower Venous DVTStudy Indications: Swelling.  Risk Factors: None identified. Limitations: Poor ultrasound/tissue interface. Comparison Study: No prior studies. Performing Technologist: Oliver Hum RVT  Examination Guidelines: A complete evaluation includes B-mode imaging, spectral Doppler, color Doppler, and power Doppler as needed of all accessible portions of each vessel. Bilateral testing is considered an integral part of a complete examination. Limited examinations for reoccurring indications may be performed as noted. The reflux portion of the exam is performed with the patient in reverse Trendelenburg.  +-----+---------------+---------+-----------+----------+--------------+ RIGHTCompressibilityPhasicitySpontaneityPropertiesThrombus Aging +-----+---------------+---------+-----------+----------+--------------+ CFV  Full           Yes      Yes                                 +-----+---------------+---------+-----------+----------+--------------+   +---------+---------------+---------+-----------+----------+--------------+ LEFT     CompressibilityPhasicitySpontaneityPropertiesThrombus Aging +---------+---------------+---------+-----------+----------+--------------+ CFV      Full           Yes      Yes                                  +---------+---------------+---------+-----------+----------+--------------+  SFJ      Full                                                        +---------+---------------+---------+-----------+----------+--------------+ FV Prox  Full                                                        +---------+---------------+---------+-----------+----------+--------------+ FV Mid   Full                                                        +---------+---------------+---------+-----------+----------+--------------+ FV DistalFull                                                        +---------+---------------+---------+-----------+----------+--------------+ PFV      Full                                                        +---------+---------------+---------+-----------+----------+--------------+ POP      None           No       No                   Acute          +---------+---------------+---------+-----------+----------+--------------+ PTV      None                                         Acute          +---------+---------------+---------+-----------+----------+--------------+ PERO     None                                         Acute          +---------+---------------+---------+-----------+----------+--------------+ Gastroc  None                                         Acute          +---------+---------------+---------+-----------+----------+--------------+     Summary: RIGHT: - No evidence of common femoral vein obstruction.  LEFT: - Findings consistent with acute deep vein thrombosis involving the left popliteal vein, left posterior tibial veins, left peroneal veins, and left gastrocnemius veins. - No cystic structure found in the popliteal fossa.  *See table(s) above for measurements and observations. Electronically signed by Deitra Mayo MD on 06/04/2019 at 12:00:49 PM.    Final  ASSESSMENT & PLAN Brigham UZOMA VIVONA  34 y.o. male with medical history significant for metastatic adenocarcinoma of the lung who presents for re-admission with intractable pain.  Unfortunately this marked the second admission for Mr. Hanna due to intractable pain.  We appreciate the assistance of palliative care in attempting to better control his pain.  We agree with Dilaudid 2 to 4 mg every 4 hours as needed in addition to a fentanyl patch.  We would also like to assure that the patient has an adequate bowel regimen to prevent him from becoming constipated while on this extensive dose of opioid.  Our sincere goal is to get systemic chemotherapy started as soon as is feasible.  We have been holding on starting systemic therapy so the patient can receive radiation treatment to the brain and to the spine in order to help adequately controlled the strongest problem areas of his disease.  Despite his widely metastatic disease this young man is still quite robust and I believe would be able to tolerate full strength chemotherapy.  We will be closely monitoring his kidney and liver functions to assure that he is able to tolerate the therapy as planned. Fortunately his planned chemotherapy regimen does not need to be altered with worsening liver function.  #Metastatic Adenocarcinoma of the Lung #Intractable Pain of Head/Spine #RLL PE and LLE DVT --appreciate the assistance of the multiple disciplines involved, including radiation oncology and palliative care --agree with the addition of a fentanyl patch to help control pain --radiation oncology made aware of his spine findings. Note they will include these areas of compression in their next treatment sessions --systemic therapy with Carboplatin/Pembrolizumab/Pemetrexed currently planned for Friday 06/23/2019. We intend to keep this start date.  --continue dexamethasone at 4mg  PO BID.  --Oncology will continue to follow.    All questions were answered. The patient knows to call the clinic with  any problems, questions or concerns.  A total of more than 55 minutes were spent on this encounter and over half of that time was spent on counseling and coordination of care as outlined above.   Ledell Peoples, MD Department of Hematology/Oncology [place] [facility] Phone: [phone] Pager: [phone] Email: [email or web address]  06/18/2019 11:57 AM

## 2019-06-18 NOTE — Plan of Care (Signed)
  Problem: Education: Goal: Knowledge of General Education information will improve Description: Including pain rating scale, medication(s)/side effects and non-pharmacologic comfort measures Outcome: Progressing   Problem: Health Behavior/Discharge Planning: Goal: Ability to manage health-related needs will improve Outcome: Progressing   Problem: Nutrition: Goal: Adequate nutrition will be maintained Outcome: Progressing   Problem: Coping: Goal: Level of anxiety will decrease Outcome: Progressing   Problem: Elimination: Goal: Will not experience complications related to bowel motility Outcome: Progressing Goal: Will not experience complications related to urinary retention Outcome: Progressing   Problem: Pain Managment: Goal: General experience of comfort will improve Outcome: Progressing   Problem: Safety: Goal: Ability to remain free from injury will improve Outcome: Progressing   Problem: Skin Integrity: Goal: Risk for impaired skin integrity will decrease Outcome: Progressing

## 2019-06-18 NOTE — Progress Notes (Signed)
Patient ID: William Ali, male   DOB: 1985/10/04, 34 y.o.   MRN: [id number]  PROGRESS NOTE    Byrant MACHAEL RAINE  DGU:[id number] DOB: [birth year] DOA: 06/17/2019 PCP: Patient, No Pcp Per    Brief Narrative:  William Ali is a 34 y.o. male very unfortunate young man with medical history significant of metastatic lung cancer who presents to the emergency department with complaints of headache,chest pain, back pain, abdominal pain.  He was just discharged from to home yesterday.  As per the patient and his wife at the bedside, he did not feel well since being discharged.  He was bothered by pain in upper back and chest.  He was also having abdominal discomfort and severe headache.  Patient was very wobbly on walking.  He said his left-sided weakness has worsened and he feels numbness on his left lower extremity. He complains of severe weakness and pain as above. There is no report of fever, chills, nausea, vomiting, dysuria or shortness of breath. He has been currently treated with radiation therapy and is  starting on chemotherapy soon.  He was diagnosed with DVT on 06/04/2019 and was diagnosed with PE on his last admission.  He was treated with heparin drip for his PE and was discharged on Xarelto.  He also recently had a Chemo-Port placement on 06/12/2019. On presentation, he was in sinus tachycardia, quite hypertensive most likely secondary to pain.  X-rays show continued extensive, expanding tumor burden. Patient was admitted for pain management, supportive care and possibly starting urgent radiation therapy for his cord compression on the T-spine. He has been started on Decadron. Palliative care consultation. Oncology and radiation oncology consulted.  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  His oncologist is Dr. Lorenso Courier who has been consulted and will follow. He is getting palliative radiation therapy for his spinal cord metastasis. Would like to complete this while here if possible. He has a Chemo-Port on the right chest.  Plan is to start on carboplatin based chemotherapy soon. Scheduled to start 06/23/2019. Pain is a large issue, began longer acting Duragesic patch and continue rescue Dilaudid. Also has Lorazepam and Tussionex if needed per Palliative care.  T-spine cord compression: Presented with worsening back pain, has worsening weakness in bilateral lower extremities more on the left.  Will start on IV Decadron--he is trying to get off this medicine as it has intolerable side effects. Switched to 4 mg BID for now.  Radiation oncology and Neurosurgery were consulted. They did not have much more to offer the patient. Request for physical therapy evaluation.--will need HH and walker at D/C  DVT/PE: Recently diagnosed.  He was just discharged on Xarelto yesterday and it was day 1.  Restart Xarelto here. Continue 15 mg twice a day for next 20 days and then switch to 20 mg daily. CT angio did not show any acute PE.  He is maintaining his saturation on room air. Still with some hematemesis.  Headache/left eye vision loss: Primary lung cancer has metastatic disease to the brain and left retina. No midline shift. Continue supportive care with pain management. There is no worsening of metastatic disease to the brain as per the CT done  today.  He is following with physician at [facility] for his left eye vision loss.  Hypertension: Hypertensive on presentation.  Currently on metoprolol.  Blood pressure is high most likely secondary to pain.  Will add antihypertensives if blood pressure remain high.  Thrombocytopenia: Most likely secondary to metastatic liver disease.  Continue monitor. Stable  today  Normocytic anemia: Associated with malignancy. currently hemoglobin stable at 11. Stable today   Goals of care: Very unfortunate young man with metastatic lung cancer.  His prognosis looks grim.  Had a detailed discussion with wife at the bedside.  They understand the prognosis. Palliative care has seen the patient and will follow. They are anxious to begin chemo as is medical oncology. They have questions about burden or expansion of disease during XRT.   DVT prophylaxis: [id number]  Code Status: Full code  Family Communication: Wife and patient Disposition Plan: Home hopefully Requires continued inpatient care due to inability to control pain and mobility issues. See PT note today.  Consultants:   Palliative care  Neurosurgery  Radiation Oncology  Medical Oncology  Procedures:  None  Antimicrobials: Anti-infectives (From admission, onward)   None      Subjective: Has had increasing pain overnight  Objective: Vitals:   06/17/19 1821 06/17/19 2003 06/18/19 0043 06/18/19 0649  BP: (!) 159/95 (!) 146/90 (!) 145/86 (!) 149/82  Pulse: 96 (!) 108 (!) 104 97  Resp: (!) 22 14 18 16   Temp: 98.3 F (36.8 C) 99.2 F (37.3 C) 98 F (36.7 C) 99 F (37.2 C)  TempSrc: Oral  Oral Oral  SpO2: 100% 99% 99% 99%  Weight: 99 kg     Height: 5\' 7"  (1.702 m)       Intake/Output Summary (Last 24 hours) at 06/18/2019 1231 Last data filed at 06/18/2019 0900 Gross per 24 hour  Intake 240 ml  Output -  Net 240 ml   Filed Weights   06/17/19 1821  Weight: 99 kg    Examination:  General exam: Appears calm and comfortable  Respiratory system: Clear to auscultation. Respiratory effort normal. Cardiovascular system: S1 & S2 heard, RRR.  Gastrointestinal system: Abdomen is nondistended, soft and nontender.  Extremities: L> R LE weakness Skin: No rashes Psychiatry: Judgement and insight appear normal. Mood & affect appropriate.   Data Reviewed: I have personally reviewed  following labs and imaging studies  CBC: Recent Labs  Lab 06/13/19 1153 06/13/19 1153 06/14/19 0250 06/15/19 0329 06/16/19 0355 06/17/19 1418 06/18/19 0450  WBC 5.5   < > 4.7 4.2 5.8 6.6 7.0  NEUTROABS 4.5  --   --   --   --   --   --   HGB 12.8*   < > 11.7* 10.1* 10.4* 11.0* 11.0*  HCT 39.9   < > 36.7* 32.1* 32.9* 34.6* 35.3*  MCV 84.5   < > 85.9 87.0 87.0 86.9 88.0  PLT 98*   < > 89* 93* 97* 113* 114*   < > = values in this interval not displayed.   Basic Metabolic Panel: Recent Labs  Lab 06/13/19 1153 06/14/19 0250 06/16/19 0355 06/17/19 1418 06/18/19 0450  NA 139 141 137 139 140  K 3.8 4.4 4.3 3.6 4.7  CL 102 106 105 102 105  CO2 24 24 25 25 25   GLUCOSE 110* 112* 160* 138* 224*  BUN 35* 28* 22* 22* 22*  CREATININE 1.34* 1.34* 1.10 1.22 1.43*  CALCIUM 9.2 9.1 8.6* 8.7* 8.9   GFR: Estimated Creatinine Clearance: 81.6  mL/min (A) (by C-G formula based on SCr of 1.43 mg/dL (H)). Liver Function Tests: Recent Labs  Lab 06/13/19 1153 06/14/19 0250 06/18/19 0450  AST 62* 49* 146*  ALT 153* 130* 355*  ALKPHOS 374* 322* 328*  BILITOT 1.0 0.9 0.6  PROT 7.0 6.2* 6.1*  ALBUMIN 3.6 3.1* 2.9*   Recent Labs  Lab 06/13/19 1153  LIPASE 28   No results for input(s): AMMONIA in the last 168 hours. Coagulation Profile: Recent Labs  Lab 06/13/19 1153  INR 1.4*   Sepsis Labs: Recent Labs  Lab 06/13/19 1153 06/13/19 1852  LATICACIDVEN 2.0* 2.5*    Recent Results (from the past 240 hour(s))  SARS Coronavirus 2 by RT PCR (hospital order, performed in [facility] hospital lab) Nasopharyngeal Nasopharyngeal Swab     Status: None   Collection Time: 06/13/19  1:42 PM   Specimen: Nasopharyngeal Swab  Result Value Ref Range Status   SARS Coronavirus 2 NEGATIVE NEGATIVE Final    Comment: (NOTE) SARS-CoV-2 target nucleic acids are NOT DETECTED. The SARS-CoV-2 RNA is generally detectable in upper and lower respiratory specimens during the acute phase of infection. The  lowest concentration of SARS-CoV-2 viral copies this assay can detect is 250 copies / mL. A negative result does not preclude SARS-CoV-2 infection and should not be used as the sole basis for treatment or other patient management decisions.  A negative result may occur with improper specimen collection / handling, submission of specimen other than nasopharyngeal swab, presence of viral mutation(s) within the areas targeted by this assay, and inadequate number of viral copies (<250 copies / mL). A negative result must be combined with clinical observations, patient history, and epidemiological information. Fact Sheet for Patients:   StrictlyIdeas.no Fact Sheet for Healthcare Providers: BankingDealers.co.za This test is not yet approved or cleared  by the [place] FDA and has been authorized for detection and/or diagnosis of SARS-CoV-2 by FDA under an Emergency Use Authorization (EUA).  This EUA will remain in effect (meaning this test can be used) for the duration of the COVID-19 declaration under Section 564(b)(1) of the Act, 21 U.S.C. section 360bbb-3(b)(1), unless the authorization is terminated or revoked sooner. Performed at [facility], [place] [address], [place], [place] [address]   Blood culture (routine x 2)     Status: None (Preliminary result)   Collection Time: 06/13/19  6:35 PM   Specimen: BLOOD  Result Value Ref Range Status   Specimen Description   Final    BLOOD PORTA CATH Performed at [place] [address], [place], [place] [address]    Special Requests   Final    BOTTLES DRAWN AEROBIC AND ANAEROBIC Blood Culture adequate volume Performed at [place] [address], [place], [place] [address]    Culture   Final    NO GROWTH 4 DAYS Performed at [facility], [place] [address] [place], [place] [address]    Report Status PENDING  Incomplete   SARS Coronavirus 2 by RT PCR (hospital order, performed in [facility] hospital lab) Nasopharyngeal Nasopharyngeal Swab     Status: None   Collection Time: 06/17/19  5:00 PM   Specimen: Nasopharyngeal Swab  Result Value Ref Range Status   SARS Coronavirus 2 NEGATIVE NEGATIVE Final    Comment: (NOTE) SARS-CoV-2 target nucleic acids are NOT DETECTED. The SARS-CoV-2 RNA is generally detectable in upper and lower respiratory specimens during the acute phase of infection. The lowest concentration of  SARS-CoV-2 viral copies this assay can detect is 250 copies / mL. A negative result does not preclude SARS-CoV-2 infection and should not be used as the sole basis for treatment or other patient management decisions.  A negative result may occur with improper specimen collection / handling, submission of specimen other than nasopharyngeal swab, presence of viral mutation(s) within the areas targeted by this assay, and inadequate number of viral copies (<250 copies / mL). A negative result must be combined with clinical observations, patient history, and epidemiological information. Fact Sheet for Patients:   StrictlyIdeas.no Fact Sheet for Healthcare Providers: BankingDealers.co.za This test is not yet approved or cleared  by the [place] FDA and has been authorized for detection and/or diagnosis of SARS-CoV-2 by FDA under an Emergency Use Authorization (EUA).  This EUA will remain in effect (meaning this test can be used) for the duration of the COVID-19 declaration under Section 564(b)(1) of the Act, 21 U.S.C. section 360bbb-3(b)(1), unless the authorization is terminated or revoked sooner. Performed at [facility], [place] [address], [place], [place] [address]       Radiology Studies: DG Chest 2 View  Result Date: 06/17/2019 CLINICAL DATA:  Chest pain, history of prior PE EXAM: CHEST - 2 VIEW COMPARISON:  06/13/2019  FINDINGS: Cardiac shadow is within normal limits. Left lung is clear. Fullness in the right hilum and suprahilar region is noted with volume loss in the upper lobe consistent with the known mass lesion. Right chest wall port is seen in satisfactory position. IMPRESSION: Increasing fullness in the right hilar and suprahilar region with volume loss in the upper lobe slightly progressed when compared with the prior exam. Electronically Signed   By: Inez Catalina M.D.   On: 06/17/2019 14:58   CT HEAD WO CONTRAST  Result Date: 06/17/2019 CLINICAL DATA:  Known brain metastases. Worsening headache. Patient recently started anticoagulation. EXAM: CT HEAD WITHOUT CONTRAST CT CERVICAL SPINE WITHOUT CONTRAST TECHNIQUE: Multidetector CT imaging of the head and cervical spine was performed following the standard protocol without intravenous contrast. Multiplanar CT image reconstructions of the cervical spine were also generated. COMPARISON:  Jun 04, 2019 CT scan of the brain. Brain MRI May 10, 2019. FINDINGS: CT HEAD FINDINGS Brain: No subdural, epidural, or subarachnoid hemorrhage. Most of the patient's known brain metastases are not visualized on this unenhanced CT scan. There is suggestion of a few metastases, very subtle. No acute cortical ischemia or infarct. Cerebellum, brainstem, and basal cisterns are unremarkable. No midline shift or mass effect identified. Vascular: No hyperdense vessel or unexpected calcification. Skull: Suspected calvarial metastases seen on MRI are not well appreciated on this study. Sinuses/Orbits: Minimal fluid in the posterior right mastoid air cells without bony erosion. There is opacification of the sphenoid sinuses which is worsened in the interval. 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 CT CERVICAL SPINE FINDINGS Alignment: Normal. Skull base and vertebrae: No fractures identified. Relative lucency in  the left lamina and pedicle at C2, extending into the spinous process with periosteal reaction. No other discrete bony lesions in the cervical spine. A lucency in the right mandibular head is well-circumscribed. Soft tissues and spinal canal: No prevertebral fluid or swelling. No visible canal hematoma. Disc levels:  Mild multilevel degenerative disc disease. Upper chest: Only the superior most aspect of the right apical mass is seen on this CT. Several small nodules in the left apex are better assessed on  today's CT scan of the chest. Other: No other abnormalities. IMPRESSION: 1. 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 There is suggestion of some of the metastases, very subtle. No midline shift. Basal cisterns are patent. 2. 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 3. Suspected calvarial metastases on the previous MRI are not well seen on this study. 4. No fracture or malalignment in the cervical spine. Lucency in the left C2 lamina and pedicle extending into the spinous process with periosteal reaction suggest metastatic disease in this region. No other definitive bony metastatic disease in the cervical spine. 5. There is a rounded lucency in the right mandibular head which is nonspecific. 6. Nodularity in the lung apices. See the CT scan of the chest for better evaluation of this region. Electronically Signed   By: Dorise Bullion III M.D   On: 06/17/2019 16:37   CT ANGIO CHEST PE W OR WO CONTRAST  Result Date: 06/17/2019 CLINICAL DATA:  Recently diagnosed metastatic lung cancer, worsening shortness of breath, chest pain, dyspnea EXAM: CT ANGIOGRAPHY CHEST WITH CONTRAST TECHNIQUE: Multidetector CT imaging of the chest was performed using the standard protocol during bolus administration of intravenous contrast. Multiplanar CT image reconstructions and MIPs were obtained to evaluate the vascular anatomy. CONTRAST:   171mL OMNIPAQUE IOHEXOL 350 MG/ML SOLN COMPARISON:  06/13/2019 FINDINGS: Cardiovascular: This is a technically adequate evaluation of the pulmonary vasculature. The chronic right lower lobe pulmonary emboli seen previously are again noted and unchanged. There are no acute pulmonary emboli. The right hilar mass results in extrinsic compression of the right upper lobe pulmonary artery, unchanged since prior exam. No pericardial effusion.  The thoracic aorta remains unremarkable. Mediastinum/Nodes: Mediastinal and right hilar adenopathy unchanged since prior exam. Index lymph node in the right paratracheal region image 22 measures 11 mm in short axis, stable since previous study. Right hilar mass measures 2.5 cm reference image 37, previously measuring approximately 2.2 cm. No new adenopathy. The thyroid, trachea, and esophagus are grossly normal. Lungs/Pleura: Thick-walled cavitary right upper lobe mass is again noted, measuring approximately 2.8 x 3.6 cm reference image 23, previously measuring 3.4 x 2.2 cm by my measurement. Findings are consistent with primary malignancy. Progressive wedge-shaped consolidation within the right upper lobe likely reflects pulmonary infarct given the extrinsic compression on the right upper lobe pulmonary artery by the infiltrative right hilar mass. Subcentimeter nodule left upper lobe image 28 and superior segment left lower lobe image 39 unchanged. There are trace bilateral pleural effusions right greater than left. No pneumothorax. There is narrowing of the right upper lobe bronchus and segmental airways due to the infiltrative right hilar mass described above, similar to previous study. Upper Abdomen: Numerous liver metastases are noted, please refer to CT abdomen report. Musculoskeletal: Mottled appearance of the thoracic spine consistent with bony metastases, stable since prior study. Pathologic T3 fracture is seen with mild retropulsion resulting in at least 50% narrowing of  the central canal. There is likely an associated soft tissue component. No other pathologic fractures. Review of the MIP images confirms the above findings. IMPRESSION: 1. 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 No acute pulmonary embolus. 2. Cavitating right upper lobe mass consistent with lung cancer. Minimal increase in size. 3. Marked right hilar and mediastinal adenopathy as above, without significant change. There is extrinsic compression upon the right upper lobe pulmonary artery and narrowing of the right upper lobe bronchus due  to the right hilar mass, stable. 4. Progressive right upper lobe pulmonary infarct due to the extrinsic compression on the right upper lobe pulmonary artery. 5. Diffuse bony metastases. Pathologic T3 fracture with resulting central canal narrowing as above. Please refer to CT thoracic spine report. 6. Trace bilateral pleural effusions. 7. Liver metastases.  Please refer to abdominal CT report. Electronically Signed   By: Randa Ngo M.D.   On: 06/17/2019 16:30   CT CERVICAL SPINE WO CONTRAST  Result Date: 06/17/2019 CLINICAL DATA:  Known brain metastases. Worsening headache. Patient recently started anticoagulation. EXAM: CT HEAD WITHOUT CONTRAST CT CERVICAL SPINE WITHOUT CONTRAST TECHNIQUE: Multidetector CT imaging of the head and cervical spine was performed following the standard protocol without intravenous contrast. Multiplanar CT image reconstructions of the cervical spine were also generated. COMPARISON:  Jun 04, 2019 CT scan of the brain. Brain MRI May 10, 2019. FINDINGS: CT HEAD FINDINGS Brain: No subdural, epidural, or subarachnoid hemorrhage. Most of the patient's known brain metastases are not visualized on this unenhanced CT scan. There is suggestion of a few metastases, very subtle. No acute cortical ischemia or infarct. Cerebellum, brainstem, and basal cisterns are unremarkable. No midline shift or mass effect identified. Vascular: No hyperdense  vessel or unexpected calcification. Skull: Suspected calvarial metastases seen on MRI are not well appreciated on this study. Sinuses/Orbits: Minimal fluid in the posterior right mastoid air cells without bony erosion. There is opacification of the sphenoid sinuses which is worsened in the interval. 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 CT CERVICAL SPINE FINDINGS Alignment: Normal. Skull base and vertebrae: No fractures identified. Relative lucency in the left lamina and pedicle at C2, extending into the spinous process with periosteal reaction. No other discrete bony lesions in the cervical spine. A lucency in the right mandibular head is well-circumscribed. Soft tissues and spinal canal: No prevertebral fluid or swelling. No visible canal hematoma. Disc levels:  Mild multilevel degenerative disc disease. Upper chest: Only the superior most aspect of the right apical mass is seen on this CT. Several small nodules in the left apex are better assessed on today's CT scan of the chest. Other: No other abnormalities. IMPRESSION: 1. 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 There is suggestion of some of the metastases, very subtle. No midline shift. Basal cisterns are patent. 2. 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 3. Suspected calvarial metastases on the previous MRI are not well seen on this study. 4. No fracture or malalignment in the cervical spine. Lucency in the left C2 lamina and pedicle extending into the spinous process with periosteal reaction suggest metastatic disease in this region. No other definitive bony metastatic disease in the cervical spine. 5. There is a rounded lucency in the right mandibular head which is nonspecific. 6. Nodularity in the lung apices. See the CT scan of the chest for better evaluation of this  region. Electronically Signed   By: Dorise Bullion III M.D   On: 06/17/2019 16:37   MR Cervical Spine Wo Contrast  Result Date: 06/17/2019 CLINICAL DATA:  34 year old male with widely metastatic lung cancer. Back pain. EXAM: MRI CERVICAL SPINE WITHOUT CONTRAST TECHNIQUE: Multiplanar, multisequence MR imaging of the cervical spine was performed. No intravenous contrast was administered. COMPARISON:  CT Thoracic Spine, Lumbar Spine, CT Chest, Abdomen, and Pelvis yesterday. Brain MRI 05/10/2019. FINDINGS:  Alignment: Overall straightening of cervical lordosis. Vertebrae: Metastatic disease to bone at virtually all cervical spine levels, and throughout the visible skull base. Only the C1 vertebra seems to be spared at this time. Anterior and posterior element metastases in the cervical spine. No pathologic cervical vertebral fracture, although there is epidural and extraosseous extension of tumor from the left C2 posterior elements (series 13, image 4) with severe involvement of the left C3 neural foramen. No significant cervical spinal stenosis. See thoracic spine findings reported separately. Cord: Spinal cord signal is within normal limits at all visualized levels. No IV contrast administered. Posterior Fossa, vertebral arteries, paraspinal tissues: Cervicomedullary junction is within normal limits. Abnormal T2 and STIR hyperintensity in the visible right cerebellar hemisphere compatible with known brain metastases. No posterior fossa mass effect is evident. Preserved major vascular flow voids in the neck. There is inflammation of the C2-C3 level posterior paraspinal muscles greater on the left which is probably related to the C2 extraosseous tumor. Disc levels: Incidental degenerative changes including: C3-C4: Small leftward disc herniation with mild spinal stenosis and mild spinal cord mass effect. C4-C5: Disc bulge and small rightward disc herniation with mild spinal stenosis and mild right hemi cord mass  effect. Associated moderate to severe right C5 foraminal stenosis. IMPRESSION: 1. Widespread osseous metastatic disease throughout the cervical spine and the visible skull base. 2. Epidural and extraosseous extension of tumor from the left C2 posterior elements with severe involvement of the left C3 neural foramen, but no malignant spinal stenosis. There is mild degenerative cervical spinal stenosis at C3-C4 and C4-C5. 3. No pathologic cervical vertebral fracture. 4. See Thoracic Spine findings reported separately. Electronically Signed   By: Genevie Ann M.D.   On: 06/17/2019 21:59   MR THORACIC SPINE WO CONTRAST  Result Date: 06/17/2019 CLINICAL DATA:  34 year old male with widely metastatic lung cancer. Back pain. EXAM: MRI THORACIC SPINE WITHOUT CONTRAST TECHNIQUE: Multiplanar, multisequence MR imaging of the thoracic spine was performed. No intravenous contrast was administered. COMPARISON:  Cervical MRI today reported separately. Thoracic spine CT, CT Chest, Abdomen, and Pelvis yesterday. FINDINGS: Limited cervical spine imaging:  Reported separately today. Thoracic spine segmentation:  Normal. Alignment:  Straightening of thoracic kyphosis. Vertebrae: Thoracic vertebral metastases at every level. Subtotal T1 vertebral replacement by tumor. Early extraosseous extension from the spinous process into the paraspinal muscles. T3 vertebral body pathologic compression fracture with 50% central loss of vertebral body height, and some retropulsed bone/tumor. Additionally, there is evidence of some epidural tumor extension at that level (series 34, image 15) resulting in mild spinal stenosis, borderline to mild cord compression. Moderate left T3 neural foraminal stenosis. Epidural tumor also at the T5 level where the vertebra is completely replaced by tumor. Abundant extraosseous extension of tumor also from the posterior elements into the posterior paraspinal musculature. Mild spinal stenosis and borderline to mild  cord compression. T6 near complete vertebral replacement by tumor with ventral and foraminal epidural tumor extension. When superimposed on epidural lipomatosis there is mild spinal stenosis and cord compression (series 34, image 28). Moderate to severe right T6 foraminal involvement. Ventral epidural tumor extension at T7 without cord compression at this time (series 34, image 32). Ventral epidural tumor extension at T9 with mild spinal stenosis and borderline to mild cord compression at this time (image 42). T10 right side posterior element extraosseous extension of tumor into the paraspinal muscles. Cord: No thoracic cord signal abnormality despite the multiple levels of epidural tumor. Paraspinal and other soft tissues: Abnormal  right upper lung and mediastinum. Small layering right pleural effusion. Partially visible liver metastases. Disc levels: There are some superimposed thoracic degenerative changes, including: -Mild degenerative thoracic spinal stenosis at T8-T9 related to left eccentric disc and endplate degeneration, mild to moderate posterior element degeneration. -borderline to mild degenerative spinal stenosis at T11-T12 related to right paracentral disc protrusion and mild facet hypertrophy. IMPRESSION: 1. Bone metastases involving every thoracic spine level. Pathologic fracture of T3 with up to 50% central vertebral loss of height, mild retropulsion of bone in tumor. 2. Ventral epidural tumor with up to mild spinal stenosis and borderline to mild cord compression at: T3, T5, T6, T7, T9. 3. Moderate or severe neural foraminal involvement by tumor at the left T3, right T6 nerve levels. 4. Superimposed mild degenerative thoracic spinal stenosis at T8-T9 and T11-T12. Electronically Signed   By: Genevie Ann M.D.   On: 06/17/2019 22:09   MR LUMBAR SPINE WO CONTRAST  Result Date: 06/17/2019 CLINICAL DATA:  34 year old male with widely metastatic lung cancer. Back pain. EXAM: MRI LUMBAR SPINE WITHOUT  CONTRAST TECHNIQUE: Multiplanar, multisequence MR imaging of the lumbar spine was performed. No intravenous contrast was administered. COMPARISON:  Thoracic spine MRI today. Lumbar CT, CT Chest, Abdomen, and Pelvis yesterday. Lumbar MRI 05/10/2019. FINDINGS: Segmentation: Normal, concordant with the thoracic spine numbering today. Alignment:  Stable lumbar lordosis since April. Vertebrae: Lumbar metastatic disease at all levels. Compared to 05/10/2019 lumbar vertebral tumor shows mild generalized progression. Confluent sacral and medial iliac bone metastatic disease redemonstrated. Mild pathologic fracture of the L2 superior endplate. This has only mildly progressed since April (series 38, image 9). No other lumbar pathologic fracture. And although there is some early lumbar extraosseous tumor extension (right L4 transverse process series 39, image 25), there is no significant lumbar epidural or neural foraminal tumor at this time. However, there is obliteration of the right S1, left S2 and S3 neural foramina by tumor. Conus medullaris and cauda equina: Conus extends to the T12-L1 level. No lower spinal cord or conus signal abnormality. Cauda equina nerve roots appear normal in the absence of contrast. Paraspinal and other soft tissues: Stable visible abdominal viscera. Disc levels: Lumbar epidural lipomatosis. Superimposed lumbar degenerative changes most notable for: -L3-L4 left paracentral disc protrusion superimposed on disc bulge, posterior element hypertrophy and epidural lipomatosis. Moderate left lateral recess stenosis (left L4 nerve level) and mild spinal stenosis. IMPRESSION: 1. Lumbar metastatic disease at all levels with mild generalized progression since the April MRI. Mild pathologic fracture of the L2 superior endplate. 2. Occasional early extraosseous extension of tumor (right L4 transverse process), but no lumbar epidural or neural foraminal tumor at this time. 3. Bulky sacral tumor re-demonstrated  including severe involvement of the right S1, left S2, and left S3 neural foramina. Electronically Signed   By: Genevie Ann M.D.   On: 06/17/2019 22:16   CT ABDOMEN PELVIS W CONTRAST  Result Date: 06/17/2019 CLINICAL DATA:  Metastatic lung cancer, chest pain, lower and mid back pain EXAM: CT ABDOMEN AND PELVIS WITH CONTRAST TECHNIQUE: Multidetector CT imaging of the abdomen and pelvis was performed using the standard protocol following bolus administration of intravenous contrast. CONTRAST:  134mL OMNIPAQUE IOHEXOL 350 MG/ML SOLN COMPARISON:  05/10/2019 FINDINGS: Lower chest: Please refer to corresponding chest CT report describing right upper lobe malignancy, right hilar and mediastinal adenopathy, and chronic right lower lobe pulmonary emboli. Hepatobiliary: Innumerable hepatic metastases are again noted, increased in size. Largest index lesion within the caudate measures 4.9 cm  image 18, previously measuring 2.8 cm image 47. No biliary dilation.  Gallbladder is unremarkable. Pancreas: Unremarkable. No pancreatic ductal dilatation or surrounding inflammatory changes. Spleen: Normal in size without focal abnormality. Adrenals/Urinary Tract: Adrenal glands are unremarkable. Kidneys are normal, without renal calculi, focal lesion, or hydronephrosis. Bladder is unremarkable. Stomach/Bowel: No bowel obstruction or ileus. No bowel wall thickening or inflammatory changes. Vascular/Lymphatic: No pathologic adenopathy within the abdomen or pelvis. Vascular structures are unremarkable. Reproductive: Prostate is unremarkable. Other: No free fluid or free gas.  No abdominal wall hernia. Musculoskeletal: Progressive diffuse bony metastases are seen throughout the bilateral hips, lumbar spine, sacrum, and bony pelvis. Please refer to dedicated CT lumbar spine describing findings related to the lumbar spine and sacrum. Increased size of the expansile left iliac crest lesion with associated soft tissue component. There are no  pathologic fractures. IMPRESSION: 1. Progressive liver metastases as above. 2. Progressive bony metastases involving the lumbar spine, bony pelvis, and sacrum. Please refer to corresponding CT lumbar spine report for important findings in that region. Electronically Signed   By: Randa Ngo M.D.   On: 06/17/2019 16:46   CT T-SPINE NO CHARGE  Result Date: 06/17/2019 CLINICAL DATA:  Metastatic lung cancer, back pain EXAM: CT THORACIC SPINE WITHOUT CONTRAST TECHNIQUE: Multidetector CT images of the thoracic were obtained using the standard protocol without intravenous contrast. COMPARISON:  06/13/2019 FINDINGS: Alignment: Alignment is anatomic. Vertebrae: Diffuse metastatic disease is seen throughout the entirety of the thoracic spine. There is moderate appearance and destruction of the T3 vertebral body with pathologic fracture centrally with 50% loss of vertebral body height. Paraspinal and other soft tissues: Please refer to corresponding chest CT report describing a cavitating right upper lobe mass, right hilar mass, and diffuse mediastinal adenopathy. Trace bilateral pleural effusions are noted. There are chronic right lower lobe segmental pulmonary emboli. Disc levels: As result of the T3 pathologic fracture and metastatic disease described above, there is approximately 50% narrowing of the central canal at T3. At T8 and T9, there are expansile metastatic lesions resulting in bowing of the posterior margin of the vertebral bodies with estimated 50-75% narrowing of the central canal and likely cord compression. IMPRESSION: 1. Diffuse bony metastases throughout the entirety of the thoracic spine, most pronounced at T3, T8, and T9 with resulting central canal narrowing and likely cord compression. 2. Pathologic fracture of the T3 vertebral body, with 50% loss of height centrally. 3. Please refer to CT chest report describing cavitating right upper lobe mass, right hilar mass, and mediastinal adenopathy.  Electronically Signed   By: Randa Ngo M.D.   On: 06/17/2019 16:40   CT L-SPINE NO CHARGE  Result Date: 06/17/2019 CLINICAL DATA:  Low back pain, metastatic lung cancer EXAM: CT LUMBAR SPINE WITHOUT CONTRAST TECHNIQUE: Multidetector CT imaging of the lumbar spine was performed without intravenous contrast administration. Multiplanar CT image reconstructions were also generated. COMPARISON:  06/13/2019 FINDINGS: Segmentation: There are 5 non-rib-bearing lumbar type vertebral bodies. Alignment: Alignment is anatomic. Vertebrae: There is diffuse metastatic disease, with multiple lytic lesions throughout the lumbar spine. These are most pronounced within the left aspect of the L1 vertebral body extending into the left pedicle, anterior aspect of the L2 vertebral body, right L3 transverse process and central aspect L3 vertebral body, right L4 pedicle and transverse process. Additional lytic lesions are seen within the bilateral iliac bones and sacrum. Bony destruction and soft tissue mass obliterate the left S2/S3 and S3/S4 neural foramen. I do not see any pathologic  fracture within the lumbar spine. Paraspinal and other soft tissues: Soft tissue mass results in obliteration of the left S2/S3 and S3/S4 neural foramen. Likely soft tissue component arising from the L1 metastatic lesion with slight enlargement of the left psoas muscle. Disc levels: There is multilevel lumbar spondylosis. Mild central canal stenosis is seen at L1/L2, L2/L3, and L3/L4. At L3/L4 there is left predominant lateral recess and neural foraminal encroachment as well. IMPRESSION: 1. Diffuse bony metastatic disease throughout the lumbar spine, sacrum, and bilateral iliac bones as above. 2. There are no pathologic fractures. Soft tissue mass and bony destruction obliterate the left S2/S3 and S3/S4 neural foramen. 3. Multilevel lumbar spondylosis most pronounced at L3/L4 with left predominant neural foraminal and lateral recess encroachment.  Electronically Signed   By: Randa Ngo M.D.   On: 06/17/2019 16:35     Scheduled Meds: . Chlorhexidine Gluconate Cloth  6 each Topical Daily  . dexamethasone  4 mg Oral Q12H  . fentaNYL  1 patch Transdermal Q72H  . metoprolol tartrate  12.5 mg Oral BID  . pantoprazole  40 mg Oral BID  . polyethylene glycol  17 g Oral Daily  . rivaroxaban  15 mg Oral BID WC   Followed by  . [START ON 07/01/2019] rivaroxaban  20 mg Oral Q supper  . senna  1 tablet Oral BID  . sodium chloride flush  10-40 mL Intracatheter Q12H   Continuous Infusions: . sodium chloride 75 mL/hr at 06/18/19 0925     LOS: 1 day    Donnamae Jude, MD 06/18/2019 12:31 PM [phone] [facility] If 7PM-7AM, please contact night-coverage 06/18/2019, 12:31 PM

## 2019-06-19 ENCOUNTER — Ambulatory Visit
Admission: RE | Admit: 2019-06-19 | Discharge: 2019-06-19 | Disposition: A | Discharge status: Home / Self Care | Payer: PRIVATE HEALTH INSURANCE | Source: Ambulatory Visit | Attending: Radiation Oncology | Admitting: Radiation Oncology

## 2019-06-19 ENCOUNTER — Ambulatory Visit: Payer: PRIVATE HEALTH INSURANCE

## 2019-06-19 DIAGNOSIS — T402X5A Adverse effect of other opioids, initial encounter: Secondary | ICD-10-CM

## 2019-06-19 DIAGNOSIS — K5903 Drug induced constipation: Secondary | ICD-10-CM

## 2019-06-19 DIAGNOSIS — M8458XA Pathological fracture in neoplastic disease, other specified site, initial encounter for fracture: Secondary | ICD-10-CM

## 2019-06-19 DIAGNOSIS — G893 Neoplasm related pain (acute) (chronic): Secondary | ICD-10-CM

## 2019-06-19 LAB — COMPREHENSIVE METABOLIC PANEL
ALT: 275 U/L — ABNORMAL HIGH (ref 0–44)
AST: 76 U/L — ABNORMAL HIGH (ref 15–41)
Albumin: 2.7 g/dL — ABNORMAL LOW (ref 3.5–5.0)
Alkaline Phosphatase: 298 U/L — ABNORMAL HIGH (ref 38–126)
Anion gap: 10 (ref 5–15)
BUN: 22 mg/dL — ABNORMAL HIGH (ref 6–20)
CO2: 25 mmol/L (ref 22–32)
Calcium: 8.5 mg/dL — ABNORMAL LOW (ref 8.9–10.3)
Chloride: 107 mmol/L (ref 98–111)
Creatinine, Ser: 1.19 mg/dL (ref 0.61–1.24)
GFR calc Af Amer: >60 mL/min (ref >60)
GFR calc non Af Amer: >60 mL/min (ref >60)
Glucose, Bld: 133 mg/dL — ABNORMAL HIGH (ref 70–99)
Potassium: 4 mmol/L (ref 3.5–5.1)
Sodium: 142 mmol/L (ref 135–145)
Total Bilirubin: <0.1 mg/dL — ABNORMAL LOW (ref 0.3–1.2)
Total Protein: 5.7 g/dL — ABNORMAL LOW (ref 6.5–8.1)

## 2019-06-19 LAB — CBC
HCT: 30.8 % — ABNORMAL LOW (ref 39.0–52.0)
Hemoglobin: 9.7 g/dL — ABNORMAL LOW (ref 13.0–17.0)
MCH: 27.4 pg (ref 26.0–34.0)
MCHC: 31.5 g/dL (ref 30.0–36.0)
MCV: 87 fL (ref 80.0–100.0)
Platelets: 122 10*3/uL — ABNORMAL LOW (ref 150–400)
RBC: 3.54 MIL/uL — ABNORMAL LOW (ref 4.22–5.81)
RDW: 16.7 % — ABNORMAL HIGH (ref 11.5–15.5)
WBC: 8.3 10*3/uL (ref 4.0–10.5)
nRBC: 0.2 % (ref 0.0–0.2)

## 2019-06-19 LAB — PHOSPHORUS: Phosphorus: 4.4 mg/dL (ref 2.5–4.6)

## 2019-06-19 LAB — MAGNESIUM: Magnesium: 1.9 mg/dL (ref 1.7–2.4)

## 2019-06-19 MED ORDER — DEXAMETHASONE SODIUM PHOSPHATE 4 MG/ML IJ SOLN
4.0000 mg | Freq: Two times a day (BID) | INTRAMUSCULAR | Status: DC
  Administered 2019-06-19 – 2019-06-27 (×12): 4 mg via INTRAVENOUS
  Filled 2019-06-19 (×18): qty 1

## 2019-06-19 MED ORDER — HYDROCOD POLST-CPM POLST ER 10-8 MG/5ML PO SUER
5.0000 mL | Freq: Two times a day (BID) | ORAL | Status: DC | PRN
  Administered 2019-06-19: 5 mL via ORAL
  Filled 2019-06-19: qty 5

## 2019-06-19 MED ORDER — HYDROMORPHONE HCL 1 MG/ML IJ SOLN
1.0000 mg | Freq: Once | INTRAMUSCULAR | Status: AC
  Administered 2019-06-19: 1 mg via INTRAVENOUS
  Filled 2019-06-19: qty 1

## 2019-06-19 MED ORDER — BENZONATATE 100 MG PO CAPS
200.0000 mg | ORAL_CAPSULE | Freq: Two times a day (BID) | ORAL | Status: AC
  Administered 2019-06-19 – 2019-06-21 (×6): 200 mg via ORAL
  Filled 2019-06-19 (×6): qty 2

## 2019-06-19 NOTE — Progress Notes (Signed)
PROGRESS NOTE  JAKOBI THETFORD LYY:[id number] DOB: 08-14-1985 DOA: 06/17/2019 PCP: Patient, No Pcp Per  HPI/Recap of past 24 hours: Camilo J Carsonis a 34 y.o.maleveryunfortunate young manwith medical history significant ofmetastatic lung cancer who presents to the emergency department with complaints of headache,chest pain,back pain, abdominal pain. He was just discharged from to homeyesterday. As per the patient and his wife at the bedside, he did not feel well since being discharged. He was bothered by pain in upper back and chest. He was also having abdominal discomfort and severe headache. Patient was very wobbly on walking.He said his left-sided weakness has worsened and he feels numbness on his left lower extremity.He complains of severe weakness and pain as above. There is no report of fever, chills, nausea, vomiting, dysuria or shortness of breath.He has been currently treated with radiation therapy and is  starting on chemotherapy soon. He was diagnosed with DVT on 06/04/2019 and was diagnosed with PE on his last admission. He was treated with heparin drip for his PE and was discharged on Xarelto. He also recently had a Chemo-Port placement on 06/12/2019. On presentation, he was in sinus tachycardia, quite hypertensive most likely secondary to pain. X-rays show continued extensive, expanding tumor burden. Patient was admitted for pain management, supportive care and possibly starting urgent radiation therapy for his cord compression on the T-spine.He has been started on Decadron. Palliative care consultation. Oncology and radiation oncology consulted.  06/19/19: Seen and examined.  At the time of the exam he had no complaints.  Later reported intractable cough with pleuritic pain.  Pain management in place.  Tessalon Perles added.   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 His oncologist Dr. Lorenso Courier following. Management directed by oncology and palliative care Chemo-Port in place on right chest. Pain and supportive management in place Palliative care following  T-spine cord compression Patient requested IV Decadron as he could not tolerate p.o. Continue IV Decadron 4 mg twice daily Continue pain management, on IV Dilaudid  DVT/PE, recently diagnosed Continue oral anticoagulation, Xarelto O2 saturation 99% on room air  Intractable cough Tessalon Perles  Anemia of chronic disease in the setting of malignancy Hemoglobin stable with no overt bleeding Continue to monitor  Headache/left eye vision loss Primary adenocarcinoma the right with mets to the brain and left retina  Goals of care Palliative care team following Currently a full code  DVT prophylaxis: [id number]  Code Status: Full code  Family Communication: Wife and patient Disposition Plan: Home hopefully Requires continued inpatient care due to inability to control pain and mobility issues. See PT note today.  Consultants:   Palliative care  Neurosurgery  Radiation Oncology  Medical Oncology  Procedures:  None     Status is: Inpatient   Dispo: The patient is from: Home.              Anticipated d/c is to: Home in the next 72 hours               Anticipated d/c date is: 06/22/2019               Patient currently ongoing oncology treatment and pain management with IV pain medications.        Objective: Vitals:  06/18/19 1604 06/18/19 2058 06/19/19 0529 06/19/19 0949  BP: (!) 159/87 137/77 (!) 147/78 (!) 147/94  Pulse: 93 93 85 89  Resp: 18 17 20 18   Temp: 97.7 F (36.5 C) 97.9 F (36.6 C) 97.9 F (36.6 C) 98 F (36.7 C)  TempSrc: Oral Oral  Oral  SpO2: 100% 100% 100% 99%   Weight:      Height:        Intake/Output Summary (Last 24 hours) at 06/19/2019 1257 Last data filed at 06/19/2019 1115 Gross per 24 hour  Intake 1422.75 ml  Output 1725 ml  Net -302.25 ml   Filed Weights   06/17/19 1821  Weight: 99 kg    Exam:  . General: 34 y.o. year-old male well developed well nourished in no acute distress.  Alert and oriented x3. . Cardiovascular: Regular rate and rhythm with no rubs or gallops.  No thyromegaly or JVD noted.   Marland Kitchen Respiratory: Clear to auscultation with no wheezes or rales. Good inspiratory effort. . Abdomen: Soft nontender nondistended with normal bowel sounds x4 quadrants. . Musculoskeletal: No lower extremity edema. 2/4 pulses in all 4 extremities. Marland Kitchen Psychiatry: Mood is appropriate for condition and setting   Data Reviewed: CBC: Recent Labs  Lab 06/13/19 1153 06/14/19 0250 06/15/19 0329 06/16/19 0355 06/17/19 1418 06/18/19 0450 06/19/19 0438  WBC 5.5   < > 4.2 5.8 6.6 7.0 8.3  NEUTROABS 4.5  --   --   --   --   --   --   HGB 12.8*   < > 10.1* 10.4* 11.0* 11.0* 9.7*  HCT 39.9   < > 32.1* 32.9* 34.6* 35.3* 30.8*  MCV 84.5   < > 87.0 87.0 86.9 88.0 87.0  PLT 98*   < > 93* 97* 113* 114* 122*   < > = values in this interval not displayed.   Basic Metabolic Panel: Recent Labs  Lab 06/14/19 0250 06/16/19 0355 06/17/19 1418 06/18/19 0450 06/19/19 0820  NA 141 137 139 140 142  K 4.4 4.3 3.6 4.7 4.0  CL 106 105 102 105 107  CO2 24 25 25 25 25   GLUCOSE 112* 160* 138* 224* 133*  BUN 28* 22* 22* 22* 22*  CREATININE 1.34* 1.10 1.22 1.43* 1.19  CALCIUM 9.1 8.6* 8.7* 8.9 8.5*  MG  --   --   --   --  1.9  PHOS  --   --   --   --  4.4   GFR: Estimated Creatinine Clearance: 98.1 mL/min (by C-G formula based on SCr of 1.19 mg/dL). Liver Function Tests: Recent Labs  Lab 06/13/19 1153 06/14/19 0250 06/18/19 0450 06/19/19 0820  AST 62* 49* 146* 76*  ALT 153* 130* 355* 275*  ALKPHOS 374* 322* 328* 298*  BILITOT 1.0 0.9 0.6  <0.1*  PROT 7.0 6.2* 6.1* 5.7*  ALBUMIN 3.6 3.1* 2.9* 2.7*   Recent Labs  Lab 06/13/19 1153  LIPASE 28   No results for input(s): AMMONIA in the last 168 hours. Coagulation Profile: Recent Labs  Lab 06/13/19 1153  INR 1.4*   Cardiac Enzymes: No results for input(s): CKTOTAL, CKMB, CKMBINDEX, TROPONINI in the last 168 hours. BNP (last 3 results) No results for input(s): PROBNP in the last 8760 hours. HbA1C: No results for input(s): HGBA1C in the last 72 hours. CBG: No results for input(s): GLUCAP in the last 168 hours. Lipid Profile: No results for input(s): CHOL, HDL, LDLCALC, TRIG, CHOLHDL, LDLDIRECT in the last 72 hours. Thyroid Function  Tests: No results for input(s): TSH, T4TOTAL, FREET4, T3FREE, THYROIDAB in the last 72 hours. Anemia Panel: No results for input(s): VITAMINB12, FOLATE, FERRITIN, TIBC, IRON, RETICCTPCT in the last 72 hours. Urine analysis:    Component Value Date/Time   COLORURINE YELLOW 06/02/2019 1838   APPEARANCEUR CLEAR 06/02/2019 1838   LABSPEC 1.016 06/02/2019 1838   PHURINE 5.0 06/02/2019 1838   GLUCOSEU NEGATIVE 06/02/2019 1838   HGBUR LARGE (A) 06/02/2019 1838   BILIRUBINUR NEGATIVE 06/02/2019 1838   KETONESUR NEGATIVE 06/02/2019 1838   PROTEINUR NEGATIVE 06/02/2019 1838   UROBILINOGEN 1.0 05/02/2014 1253   NITRITE NEGATIVE 06/02/2019 1838   LEUKOCYTESUR NEGATIVE 06/02/2019 1838   Sepsis Labs: @LABRCNTIP (procalcitonin:4,lacticidven:4)  ) Recent Results (from the past 240 hour(s))  SARS Coronavirus 2 by RT PCR (hospital order, performed in [place] hospital lab) Nasopharyngeal Nasopharyngeal Swab     Status: None   Collection Time: 06/13/19  1:42 PM   Specimen: Nasopharyngeal Swab  Result Value Ref Range Status   SARS Coronavirus 2 NEGATIVE NEGATIVE Final    Comment: (NOTE) SARS-CoV-2 target nucleic acids are NOT DETECTED. The SARS-CoV-2 RNA is generally detectable in upper and lower respiratory specimens during the acute phase  of infection. The lowest concentration of SARS-CoV-2 viral copies this assay can detect is 250 copies / mL. A negative result does not preclude SARS-CoV-2 infection and should not be used as the sole basis for treatment or other patient management decisions.  A negative result may occur with improper specimen collection / handling, submission of specimen other than nasopharyngeal swab, presence of viral mutation(s) within the areas targeted by this assay, and inadequate number of viral copies (<250 copies / mL). A negative result must be combined with clinical observations, patient history, and epidemiological information. Fact Sheet for Patients:   StrictlyIdeas.no Fact Sheet for Healthcare Providers: BankingDealers.co.za This test is not yet approved or cleared  by the [place] FDA and has been authorized for detection and/or diagnosis of SARS-CoV-2 by FDA under an Emergency Use Authorization (EUA).  This EUA will remain in effect (meaning this test can be used) for the duration of the COVID-19 declaration under Section 564(b)(1) of the Act, 21 U.S.C. section 360bbb-3(b)(1), unless the authorization is terminated or revoked sooner. Performed at [facility], [place] [address] [place], [place] [address]   Blood culture (routine x 2)     Status: None   Collection Time: 06/13/19  6:35 PM   Specimen: BLOOD  Result Value Ref Range Status   Specimen Description   Final    BLOOD PORTA CATH Performed at [facility] [address], [place], [place] [address]    Special Requests   Final    BOTTLES DRAWN AEROBIC AND ANAEROBIC Blood Culture adequate volume Performed at [facility] [address], [place], [place] [address]    Culture   Final    NO GROWTH 5 DAYS Performed at [facility], [place] [address], [place], [place] [address]    Report Status 06/18/2019 FINAL  Final   SARS Coronavirus 2 by RT PCR (hospital order, performed in [facility] hospital lab) Nasopharyngeal Nasopharyngeal Swab     Status: None   Collection Time: 06/17/19  5:00 PM   Specimen: Nasopharyngeal Swab  Result Value Ref Range Status   SARS Coronavirus 2 NEGATIVE NEGATIVE Final    Comment: (NOTE) SARS-CoV-2 target nucleic acids are NOT DETECTED. The SARS-CoV-2 RNA is generally detectable in upper and lower respiratory  specimens during the acute phase of infection. The lowest concentration of SARS-CoV-2 viral copies this assay can detect is 250 copies / mL. A negative result does not preclude SARS-CoV-2 infection and should not be used as the sole basis for treatment or other patient management decisions.  A negative result may occur with improper specimen collection / handling, submission of specimen other than nasopharyngeal swab, presence of viral mutation(s) within the areas targeted by this assay, and inadequate number of viral copies (<250 copies / mL). A negative result must be combined with clinical observations, patient history, and epidemiological information. Fact Sheet for Patients:   StrictlyIdeas.no Fact Sheet for Healthcare Providers: BankingDealers.co.za This test is not yet approved or cleared  by the [place] FDA and has been authorized for detection and/or diagnosis of SARS-CoV-2 by FDA under an Emergency Use Authorization (EUA).  This EUA will remain in effect (meaning this test can be used) for the duration of the COVID-19 declaration under Section 564(b)(1) of the Act, 21 U.S.C. section 360bbb-3(b)(1), unless the authorization is terminated or revoked sooner. Performed at [facility], [place] [address], [place], [place] [address]       Studies: No results found.  Scheduled Meds: . benzonatate  200 mg Oral BID  . Chlorhexidine Gluconate Cloth  6 each Topical Daily  . dexamethasone  (DECADRON) injection  4 mg Intravenous Q12H  . fentaNYL  1 patch Transdermal Q72H  . metoprolol tartrate  12.5 mg Oral BID  . pantoprazole  40 mg Oral BID  . polyethylene glycol  17 g Oral Daily  . rivaroxaban  15 mg Oral BID WC   Followed by  . [START ON 07/08/2019] rivaroxaban  20 mg Oral Q supper  . senna  1 tablet Oral BID  . sodium chloride flush  10-40 mL Intracatheter Q12H    Continuous Infusions: . sodium chloride 75 mL/hr at 06/19/19 1047     LOS: 2 days     Kayleen Memos, MD [facility] Pager [phone]  If 7PM-7AM, please contact night-coverage [email or web address] Password [facility] 06/19/2019, 12:57 PM

## 2019-06-19 NOTE — Progress Notes (Signed)
Patient complains of worsening cough. Tessalon pearls administered. Pt reports medication not assisting with cough. Hospitalist provider contacted, V.O received to resume Tussionex 10-8 mg/ml every 12 hours as needed for cough. Will continue to monitor.

## 2019-06-19 NOTE — TOC Initial Note (Signed)
Transition of Care [facility]) - Initial/Assessment Note    Patient Details  Name: William Ali MRN: [id number] Date of Birth: 06-16-1985  Transition of Care (TOC) CM/SW Contact:    Joaquin Courts, RN Phone Number: 06/19/2019, 3:27 PM  Clinical Narrative:       CM spoke with patient who reports he does not have a pcp but was planning to use his oncologist for medical needs at this time.  CM did discuss the [facility] with patient including financial assistance available thorough the clinic that would help with his costs of medication and seeing a provider.  Patient is agreeable to pcp follow-up at [facility].  Appointment information was placed on AVS.  [employer] given referral for RW and 3in1.             Expected Discharge Plan: Home/Self Care Barriers to Discharge: Continued Medical Work up   Patient Goals and CMS Choice Patient states their goals for this hospitalization and ongoing recovery are:: to go home      Expected Discharge Plan and Services Expected Discharge Plan: Home/Self Care   Discharge Planning Services: CM Consult   Living arrangements for the past 2 months: Apartment                 DME Arranged: Walker rolling, 3-N-1 DME Agency: [employer] Date DME Agency Contacted: 06/19/19 Time DME Agency Contacted: [phone] Representative spoke with at DME Agency: Thedore Mins            Prior Living Arrangements/Services Living arrangements for the past 2 months: Apartment Lives with:: Spouse Patient language and need for interpreter reviewed:: Yes Do you feel safe going back to the place where you live?: Yes      Need for Family Participation in Patient Care: No (Comment) Care giver support system in place?: Yes (comment)   Criminal Activity/Legal Involvement Pertinent to Current Situation/Hospitalization: No - Comment as needed  Activities of Daily Living Home Assistive Devices/Equipment: None ADL Screening (condition at time of admission) [name] cognitive ability adequate  to safely complete daily activities?: Yes Is the patient deaf or have difficulty hearing?: No Does the patient have difficulty seeing, even when wearing glasses/contacts?: No Does the patient have difficulty concentrating, remembering, or making decisions?: No Patient able to express need for assistance with ADLs?: Yes Does the patient have difficulty dressing or bathing?: No Independently performs ADLs?: Yes (appropriate for developmental age) Does the patient have difficulty walking or climbing stairs?: Yes Weakness of Legs: Left Weakness of Arms/Hands: None  Permission Sought/Granted                  Emotional Assessment Appearance:: Appears stated age Attitude/Demeanor/Rapport: Engaged Affect (typically observed): Accepting Orientation: : Oriented to Self, Oriented to Place, Oriented to  Time, Oriented to Situation   Psych Involvement: No (comment)  Admission diagnosis:  Back pain [M54.9] Malignancy ([place]) [C80.1] 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 Palliative care encounter   . Metastatic primary lung cancer ([place]) 06/17/2019  . 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 Pulmonary embolism ([place]) 06/13/2019  . DVT (deep venous thrombosis) ([place]) 06/13/2019  . Goals of care, counseling/discussion 05/21/2019  .  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 Adenocarcinoma of lung ([place]) 05/21/2019   PCP:  Patient, No Pcp Per Pharmacy:   [id number] - [place], [place] [address] Phone: [phone] Fax: [phone]  [facility] [place], [address] [place] [address] Phone: [phone] Fax: [phone]     Social Determinants of Health (SDOH) Interventions    Readmission Risk Interventions Readmission Risk Prevention Plan 06/19/2019 06/14/2019  Transportation Screening Complete Complete  Medication Review Press photographer) Complete Complete  PCP or Specialist appointment within 3-5 days of discharge Complete Complete  HRI or Home Care Consult Complete Complete  SW Recovery Care/Counseling Consult Complete Complete  Palliative Care Screening Not Applicable Not [place] Not Applicable Not Applicable  Some recent data might be hidden

## 2019-06-19 NOTE — Progress Notes (Signed)
Patient complained of worsening pain in back and abdomen. On-call provider paged to assist. V.O received to give Dilaudid 1 mg IV for one more dose, now.

## 2019-06-19 NOTE — Progress Notes (Signed)
PT Cancellation Note  Patient Details Name: William Ali MRN: [id number] DOB: 03-04-1985   Cancelled Treatment:    Reason Eval/Treat Not Completed: Fatigue/lethargy limiting ability to participate - Pt just finished seeing OT, and is preparing for radiation at 1530. PT to check back as schedule allows, likely tomorrow.  [place] Pager [phone]  Office [phone]    Roxine Caddy D Elonda Husky 06/19/2019, 3:07 PM

## 2019-06-19 NOTE — Progress Notes (Signed)
Patient stated that PO decadron upsets stomach and request if possible for medication to be ordered intravenously. Hospitalist provider contacted. Order changed. Pt request for medication to be on hold until stomach settles. Will continue to monitor and encourage.

## 2019-06-19 NOTE — Progress Notes (Signed)
Palliative care progress note  Reason for visit: symptom management- cancer related pain  I stopped by to check in on William Ali this evening.  He was in severe pain after returning from radiation.  He was displaying sign of significant distress, including restlessness and groaning.  RN was giving his rescue dose of IV dilaudid.    I followed up 20 minutes later and he reported feeling better.  He still looked uncomfortable to me, but he stated that any more medicine would make him sleepy and he did not want that right now.  We discussed that his pain remains largely uncontrolled despite initiation of fentanyl patch yesterday.  MAR reveals that he has had 5mg  of IV dilaudid (plus dose he just received) for a total oral morphine equivalent of 120mg  of oral morphine.  It is difficult to judge his needs moving forward effectively as he just started fentanyl patch yesterday, but we will certainly need to uptitrate his overall opioid usage.    I discussed with his options for pain management tonight while working to continue to gauge his overall needs.  My initial recommendation was for starting PCA overnight.  In discussion with him, however, he reports that he cannot tolerate ETCO2 detector that would be necessary.  In talking with him further, he reports that he thinks that the dilaudid has been working, but it doesn't last until he is due for next dose.  I talked with him more about this (as he has it ordered every 2 hours as needed and has not been using it nearly this frequently) and he tells me that he was encouraged at one point to not use IV medication more frequently than every 6 hours.  I talked with [name] about using pain medication for cancer related pain and that he needs to use rescue dosing as needed to control his pain as that is how we are going to determine what medications to change to at time of discharge.  He expressed understanding and stated he will use medication as needed tonight.  He  also expressed that he has abdominal pain following certain medications, including IV pain medications.  Attempted to explore this a little, but really [name] is worn out from radiation and tells me he needs to rest.  - Continue current regimen, including fentanyl patch and dilaudid IV every 2 hours as needed.  I encouraged him to use medication as needed to allow William Ali to better determine his needs.   - I believe we will need to uptitrate his fentanyl patch and then work to convert over to oral breakthrough medication. -  Increase senna to two tabs twice daily - PMT to f/u tomorrow.  Total time: 30 minutes ]Greater than 50%  of this time was spent counseling and coordinating care related to the above assessment and plan.  Micheline Rough, MD [place] [facility] [phone]

## 2019-06-19 NOTE — Evaluation (Signed)
Occupational Therapy Evaluation Patient Details Name: William Ali MRN: [id number] DOB: Dec 13, 1985 Today's Date: 06/19/2019    History of Present Illness William Ali is a 34 y.o. 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  Patient was seen in the ED on 05/10/2019 with a headache and left pelvic pain.  He subsequently underwent CT of the C/A/P and MRI of the brain and was found to have widely metastatic disease with a right lung mass, lytic lesions throughout the thoracic and lumbar spine and pelvis, and numerous liver lesions.  Patient subsequently underwent liver biopsy on 05/16/2019 with pathology showing poorly differentiated adenocarcinoma.   Clinical Impression   Pt admitted with the above. Pt currently with functional limitations due to the deficits listed below (see OT Problem List).  Pt will benefit from skilled OT to increase their safety and independence with ADL and functional mobility for ADL to facilitate discharge to venue listed below.      Follow Up Recommendations  Home health OT;Supervision/Assistance - 24 hour    Equipment Recommendations  3 in 1 bedside commode    Recommendations for Other Services       Precautions / Restrictions Precautions Precautions: Fall      Mobility Bed Mobility Overal bed mobility: Needs Assistance Bed Mobility: Rolling;Sidelying to Sit Rolling: Min guard Sidelying to sit: Min guard       General bed mobility comments: cues for log rolling technique and proper body mechanics to decrease strain on back.  Transfers Overall transfer level: Needs assistance Equipment used: Rolling walker (2 wheeled) Transfers: Sit to/from Stand Sit to Stand: Min guard;From elevated surface         General transfer comment: cues for proper hand placement    Balance Overall balance assessment: Needs assistance   Sitting balance-Leahy Scale: Fair     Standing  balance support: Bilateral upper extremity supported;During functional activity Standing balance-Leahy Scale: Fair Standing balance comment: Washed hands at sink, although some leaning forward to give support against sink                           ADL either performed or assessed with clinical judgement   ADL Overall ADL's : Needs assistance/impaired Eating/Feeding: Set up;Sitting   Grooming: Set up;Sitting   Upper Body Bathing: Set up;Sitting   Lower Body Bathing: Maximal assistance;Sit to/from stand   Upper Body Dressing : Set up;Sitting   Lower Body Dressing: Maximal assistance;Sit to/from stand;Cueing for compensatory techniques;Cueing for safety;Cueing for sequencing   Toilet Transfer: Minimal assistance;RW;Cueing for safety;Cueing for sequencing;Stand-pivot   Toileting- Clothing Manipulation and Hygiene: Moderate assistance;Sit to/from stand;Cueing for safety;Cueing for sequencing     Tub/Shower Transfer Details (indicate cue type and reason): discussed need for tub bench                      Pertinent Vitals/Pain Pain Score: 4  Pain Location: back and stomach Pain Descriptors / Indicators: Constant Pain Intervention(s): Monitored during session;Repositioned     Hand Dominance     Extremity/Trunk Assessment Upper Extremity Assessment Upper Extremity Assessment: Generalized weakness           Communication Communication Communication: No difficulties   Cognition Arousal/Alertness: Awake/alert Behavior During Therapy: WFL for tasks assessed/performed Overall Cognitive Status: Within Functional Limits for tasks assessed  Home Living Family/patient expects to be discharged to:: Private residence Living Arrangements: Spouse/significant other(per chart review, children currently in [place] with family) Available Help at Discharge: Family;Available PRN/intermittently(wife who is  having to call out at work. No other family available) Type of Home: House(townhome) Home Access: Stairs to enter [employer] of Steps: 2   Home Layout: Two level;Bed/bath upstairs Alternate Level Stairs-Number of Steps: flight Alternate Level Stairs-Rails: Left Bathroom Shower/Tub: Teacher, early years/pre: Standard     Home Equipment: None          Prior Functioning/Environment Level of Independence: Independent        Comments: decreased vision in L eye and tends to keep closed.  Donned glasses with L eye patch for gait.        OT Problem List: Decreased strength;Decreased activity tolerance;Decreased safety awareness;Impaired balance (sitting and/or standing);Obesity;Pain      OT Treatment/Interventions: Self-care/ADL training;DME and/or AE instruction;Patient/family education    OT Goals(Current goals can be found in the care plan section) Acute Rehab OT Goals Patient Stated Goal: walk OT Goal Formulation: With patient Time For Goal Achievement: 06/27/19 Potential to Achieve Goals: Good  OT Frequency: Min 2X/week   Barriers to D/C:               AM-PAC OT "6 Clicks" Daily Activity     Outcome Measure Help from another person eating meals?: A Little Help from another person taking care of personal grooming?: A Little Help from another person toileting, which includes using toliet, bedpan, or urinal?: A Little Help from another person bathing (including washing, rinsing, drying)?: A Little Help from another person to put on and taking off regular upper body clothing?: A Little Help from another person to put on and taking off regular lower body clothing?: A Lot 6 Click Score: 17   End of Session Nurse Communication: Mobility status  Activity Tolerance: Patient limited by fatigue Patient left: in chair;with family/visitor present;with call bell/phone within reach  OT Visit Diagnosis: Unsteadiness on feet (R26.81);Other abnormalities  of gait and mobility (R26.89);Muscle weakness (generalized) (M62.81)                Time: 1420-1450 OT Time Calculation (min): 30 min Charges:  OT General Charges $OT Visit: 1 Visit OT Evaluation $OT Eval Moderate Complexity: 1 Mod OT Treatments $Self Care/Home Management : 8-22 mins  William Ali, OT Acute Rehabilitation Services [phone] Office- [phone], Edwena Felty D 06/19/2019, 4:35 PM

## 2019-06-19 NOTE — Progress Notes (Signed)
Pharmacist Chemotherapy Monitoring - Follow Up Assessment    I verify that I have reviewed each item in the below checklist:  . Regimen for the patient is scheduled for the appropriate day and plan matches scheduled date. Marland Kitchen Appropriate non-routine labs are ordered dependent on drug ordered. . If applicable, additional medications reviewed and ordered per protocol based on lifetime cumulative doses and/or treatment regimen.   Plan for follow-up and/or issues identified: Yes . I-vent associated with next due treatment: Yes . MD and/or nursing notified: No  [name] Course 06/19/2019 11:32 AM

## 2019-06-20 ENCOUNTER — Ambulatory Visit: Payer: PRIVATE HEALTH INSURANCE

## 2019-06-20 ENCOUNTER — Ambulatory Visit
Admission: RE | Admit: 2019-06-20 | Discharge: 2019-06-20 | Disposition: A | Discharge status: Home / Self Care | Payer: PRIVATE HEALTH INSURANCE | Source: Ambulatory Visit | Attending: Radiation Oncology | Admitting: Radiation Oncology

## 2019-06-20 ENCOUNTER — Encounter (HOSPITAL_COMMUNITY): Payer: Self-pay | Admitting: Hematology and Oncology

## 2019-06-20 DIAGNOSIS — R5383 Other fatigue: Secondary | ICD-10-CM

## 2019-06-20 DIAGNOSIS — B370 Candidal stomatitis: Secondary | ICD-10-CM

## 2019-06-20 DIAGNOSIS — T451X5A Adverse effect of antineoplastic and immunosuppressive drugs, initial encounter: Secondary | ICD-10-CM

## 2019-06-20 DIAGNOSIS — R110 Nausea: Secondary | ICD-10-CM

## 2019-06-20 DIAGNOSIS — R197 Diarrhea, unspecified: Secondary | ICD-10-CM

## 2019-06-20 LAB — COMPREHENSIVE METABOLIC PANEL
ALT: 243 U/L — ABNORMAL HIGH (ref 0–44)
AST: 57 U/L — ABNORMAL HIGH (ref 15–41)
Albumin: 2.8 g/dL — ABNORMAL LOW (ref 3.5–5.0)
Alkaline Phosphatase: 331 U/L — ABNORMAL HIGH (ref 38–126)
Anion gap: 10 (ref 5–15)
BUN: 22 mg/dL — ABNORMAL HIGH (ref 6–20)
CO2: 26 mmol/L (ref 22–32)
Calcium: 8.4 mg/dL — ABNORMAL LOW (ref 8.9–10.3)
Chloride: 104 mmol/L (ref 98–111)
Creatinine, Ser: 1.2 mg/dL (ref 0.61–1.24)
GFR calc Af Amer: >60 mL/min (ref >60)
GFR calc non Af Amer: >60 mL/min (ref >60)
Glucose, Bld: 182 mg/dL — ABNORMAL HIGH (ref 70–99)
Potassium: 4.1 mmol/L (ref 3.5–5.1)
Sodium: 140 mmol/L (ref 135–145)
Total Bilirubin: 0.5 mg/dL (ref 0.3–1.2)
Total Protein: 5.8 g/dL — ABNORMAL LOW (ref 6.5–8.1)

## 2019-06-20 LAB — CBC WITH DIFFERENTIAL/PLATELET
Abs Immature Granulocytes: 0.19 10*3/uL — ABNORMAL HIGH (ref 0.00–0.07)
Basophils Absolute: 0 10*3/uL (ref 0.0–0.1)
Basophils Relative: 0 %
Eosinophils Absolute: 0 10*3/uL (ref 0.0–0.5)
Eosinophils Relative: 0 %
HCT: 31.9 % — ABNORMAL LOW (ref 39.0–52.0)
Hemoglobin: 10 g/dL — ABNORMAL LOW (ref 13.0–17.0)
Immature Granulocytes: 3 %
Lymphocytes Relative: 2 %
Lymphs Abs: 0.2 10*3/uL — ABNORMAL LOW (ref 0.7–4.0)
MCH: 27.2 pg (ref 26.0–34.0)
MCHC: 31.3 g/dL (ref 30.0–36.0)
MCV: 86.9 fL (ref 80.0–100.0)
Monocytes Absolute: 0.5 10*3/uL (ref 0.1–1.0)
Monocytes Relative: 7 %
Neutro Abs: 6.5 10*3/uL (ref 1.7–7.7)
Neutrophils Relative %: 88 %
Platelets: 134 10*3/uL — ABNORMAL LOW (ref 150–400)
RBC: 3.67 MIL/uL — ABNORMAL LOW (ref 4.22–5.81)
RDW: 17 % — ABNORMAL HIGH (ref 11.5–15.5)
WBC: 7.4 10*3/uL (ref 4.0–10.5)
nRBC: 0.3 % — ABNORMAL HIGH (ref 0.0–0.2)

## 2019-06-20 MED ORDER — HYDROCOD POLST-CPM POLST ER 10-8 MG/5ML PO SUER: 5.0000 mL | Freq: Two times a day (BID) | ORAL | Status: DC | PRN

## 2019-06-20 MED ORDER — HYDROMORPHONE HCL 1 MG/ML IJ SOLN
1.0000 mg | Freq: Once | INTRAMUSCULAR | Status: DC
  Filled 2019-06-20: qty 1

## 2019-06-20 MED ORDER — SENNA 8.6 MG PO TABS
2.0000 | ORAL_TABLET | Freq: Two times a day (BID) | ORAL | Status: DC
  Administered 2019-06-20 – 2019-06-27 (×13): 17.2 mg via ORAL
  Filled 2019-06-20 (×16): qty 2

## 2019-06-20 MED ORDER — FENTANYL 50 MCG/HR TD PT72
1.0000 | MEDICATED_PATCH | TRANSDERMAL | Status: DC
  Administered 2019-06-20 – 2019-06-23 (×2): 1 via TRANSDERMAL
  Filled 2019-06-20 (×2): qty 1

## 2019-06-20 NOTE — Progress Notes (Signed)
Patient refused am dose IV decadron 4mg . Discussed use of Decadron. Patient reports feeling medication has contributed to abdominal discomfort. Provider updated.

## 2019-06-20 NOTE — Progress Notes (Signed)
PROGRESS NOTE  William Ali YPP:[id number] DOB: 04-Aug-1985 DOA: 06/17/2019 PCP: Patient, No Pcp Per  HPI/Recap of past 24 hours: William J Carsonis a 34 y.o.maleveryunfortunate young manwith medical history significant ofmetastatic lung cancer who presents to the emergency department with complaints of headache,chest pain,back pain, abdominal pain. He was just discharged from to homeyesterday. As per the patient and his wife at the bedside, he did not feel well since being discharged. He was bothered by pain in upper back and chest. He was also having abdominal discomfort and severe headache. Patient was very wobbly on walking.He said his left-sided weakness has worsened and he feels numbness on his left lower extremity.He complains of severe weakness and pain as above. There is no report of fever, chills, nausea, vomiting, dysuria or shortness of breath.He has been currently treated with radiation therapy and is  starting on chemotherapy soon. He was diagnosed with DVT on 06/04/2019 and was diagnosed with PE on his last admission. He was treated with heparin drip for his PE and was discharged on Xarelto. He also recently had a Chemo-Port placement on 06/12/2019. On presentation, he was in sinus tachycardia, quite hypertensive most likely secondary to pain. X-rays show continued extensive, expanding tumor burden. Patient was admitted for pain management, supportive care and possibly starting urgent radiation therapy for his cord compression on the T-spine.He has been started on Decadron. Palliative care consultation. Oncology and radiation oncology consulted.  PMT consulted for cancer related pain management.  06/20/19:  Seen and examined.  Pain is improved on current pain management by PMT.    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 His oncologist Dr. Lorenso Courier following. Management directed by oncology and palliative care Chemo-Port in place on right chest. Cancer related pain management per PMT Pain improved  T-spine cord compression Patient requested IV Decadron as he could not tolerate p.o. Continue IV Decadron 4 mg twice daily Continue pain management as recommended by PMT  DVT/PE, recently diagnosed Continue oral anticoagulation, Xarelto O2 saturation 99% on room air  Intractable cough Continue Tessalon Perles  Anemia of chronic disease in the setting of malignancy Hemoglobin stable with no overt bleeding Continue to monitor  Headache/left eye vision loss Primary adenocarcinoma the right with mets to the brain and left retina  Goals of care Palliative care team following Currently a full code  DVT prophylaxis: [id number]  Code Status: Full code  Family Communication: None at bedside    Consultants:   Palliative care  Neurosurgery  Radiation Oncology  Medical Oncology  Procedures:  None     Status is: Inpatient   Dispo: The patient is from: Home.              Anticipated d/c is to: Home in the next 72 hours               Anticipated d/c date is: 06/23/2019               Patient currently ongoing oncology treatment and pain management with IV pain medications.        Objective: Vitals:   06/19/19 2032 06/20/19 0517 06/20/19 1339 06/20/19 1507  BP: (!) 147/78 (!) 144/83 (!) 150/89 137/80  Pulse: 98 86 (!) 101 96  Resp: 19 19  16   Temp: 98.4 F (36.9 C) 98.1 F (36.7 C)  (!) 97.5 F (36.4 C)  TempSrc: Oral Oral    SpO2: 98% 98% 100% 100%  Weight:      Height:        Intake/Output Summary (Last 24 hours) at 06/20/2019 1640 Last data filed at 06/20/2019 1256 Gross per 24 hour  Intake 100 ml  Output 2400 ml  Net -2300 ml   Filed  Weights   06/17/19 1821  Weight: 99 kg    Exam:  . General: 34 y.o. year-old male well-developed well-nourished no acute stress. Alert oriented x3.  . Cardiovascular: Regular rate and rhythm no rubs or gallops.  Marland Kitchen Respiratory: Clear to auscultation no wheezes or rales. . Abdomen: Soft nontender normal bowel sounds present. . Musculoskeletal: No lower extremity edema bilaterally. Marland Kitchen Psychiatry: Mood is appropriate for condition and setting.   Data Reviewed: CBC: Recent Labs  Lab 06/16/19 0355 06/17/19 1418 06/18/19 0450 06/19/19 0438 06/20/19 0935  WBC 5.8 6.6 7.0 8.3 7.4  NEUTROABS  --   --   --   --  6.5  HGB 10.4* 11.0* 11.0* 9.7* 10.0*  HCT 32.9* 34.6* 35.3* 30.8* 31.9*  MCV 87.0 86.9 88.0 87.0 86.9  PLT 97* 113* 114* 122* 465*   Basic Metabolic Panel: Recent Labs  Lab 06/16/19 0355 06/17/19 1418 06/18/19 0450 06/19/19 0820 06/20/19 0935  NA 137 139 140 142 140  K 4.3 3.6 4.7 4.0 4.1  CL 105 102 105 107 104  CO2 25 25 25 25 26   GLUCOSE 160* 138* 224* 133* 182*  BUN 22* 22* 22* 22* 22*  CREATININE 1.10 1.22 1.43* 1.19 1.20  CALCIUM 8.6* 8.7* 8.9 8.5* 8.4*  MG  --   --   --  1.9  --   PHOS  --   --   --  4.4  --    GFR: Estimated Creatinine Clearance: 97.3 mL/min (by C-G formula based on SCr of 1.2 mg/dL). Liver Function Tests: Recent Labs  Lab 06/14/19 0250 06/18/19 0450 06/19/19 0820 06/20/19 0935  AST 49* 146* 76* 57*  ALT 130* 355* 275* 243*  ALKPHOS 322* 328* 298* 331*  BILITOT 0.9 0.6 <0.1* 0.5  PROT 6.2* 6.1* 5.7* 5.8*  ALBUMIN 3.1* 2.9* 2.7* 2.8*   No results for input(s): LIPASE, AMYLASE in the last 168 hours. No results for input(s): AMMONIA in the last 168 hours. Coagulation Profile: No results for input(s): INR, PROTIME in the last 168 hours. Cardiac Enzymes: No results for input(s): CKTOTAL, CKMB, CKMBINDEX, TROPONINI in the last 168 hours. BNP (last 3 results) No results for input(s): PROBNP in the last 8760 hours. HbA1C: No  results for input(s): HGBA1C in the last 72 hours. CBG: No results for input(s): GLUCAP in the last 168 hours. Lipid Profile: No results for input(s): CHOL, HDL, LDLCALC, TRIG, CHOLHDL, LDLDIRECT in the last 72 hours. Thyroid Function Tests: No results for input(s): TSH, T4TOTAL, FREET4, T3FREE, THYROIDAB in the last 72 hours. Anemia Panel: No results for input(s): VITAMINB12, FOLATE, FERRITIN, TIBC, IRON, RETICCTPCT in the last 72 hours. Urine analysis:    Component Value Date/Time   COLORURINE YELLOW 06/02/2019 1838   APPEARANCEUR CLEAR 06/02/2019 1838   LABSPEC 1.016 06/02/2019 1838   PHURINE 5.0 06/02/2019 1838   GLUCOSEU NEGATIVE 06/02/2019 1838   HGBUR LARGE (A) 06/02/2019 1838   BILIRUBINUR NEGATIVE 06/02/2019 1838   KETONESUR NEGATIVE 06/02/2019 1838   PROTEINUR NEGATIVE 06/02/2019 1838  UROBILINOGEN 1.0 05/02/2014 1253   NITRITE NEGATIVE 06/02/2019 1838   LEUKOCYTESUR NEGATIVE 06/02/2019 1838   Sepsis Labs: @LABRCNTIP (procalcitonin:4,lacticidven:4)  ) Recent Results (from the past 240 hour(s))  SARS Coronavirus 2 by RT PCR (hospital order, performed in [facility] hospital lab) Nasopharyngeal Nasopharyngeal Swab     Status: None   Collection Time: 06/13/19  1:42 PM   Specimen: Nasopharyngeal Swab  Result Value Ref [place] Status   SARS Coronavirus 2 NEGATIVE NEGATIVE Final    Comment: (NOTE) SARS-CoV-2 target nucleic acids are NOT DETECTED. The SARS-CoV-2 RNA is generally detectable in upper and lower respiratory specimens during the acute phase of infection. The lowest concentration of SARS-CoV-2 viral copies this assay can detect is 250 copies / mL. A negative result does not preclude SARS-CoV-2 infection and should not be used as the sole basis for treatment or other patient management decisions.  A negative result may occur with improper specimen collection / handling, submission of specimen other than nasopharyngeal swab, presence of viral mutation(s) within  the areas targeted by this assay, and inadequate number of viral copies (<250 copies / mL). A negative result must be combined with clinical observations, patient history, and epidemiological information. Fact Sheet for Patients:   StrictlyIdeas.no Fact Sheet for Healthcare Providers: BankingDealers.co.za This test is not yet approved or cleared  by the [place] FDA and has been authorized for detection and/or diagnosis of SARS-CoV-2 by FDA under an Emergency Use Authorization (EUA).  This EUA will remain in effect (meaning this test can be used) for the duration of the COVID-19 declaration under Section 564(b)(1) of the Act, 21 U.S.C. section 360bbb-3(b)(1), unless the authorization is terminated or revoked sooner. Performed at [facility], [place] [address] [place], [place] [address]   Blood culture (routine x 2)     Status: None   Collection Time: 06/13/19  6:35 PM   Specimen: BLOOD  Result Value Ref [place] Status   Specimen Description   Final    BLOOD PORTA CATH Performed at [place] [address], [place], [place] [address]    Special Requests   Final    BOTTLES DRAWN AEROBIC AND ANAEROBIC Blood Culture adequate volume Performed at [place] [address], [place], [place] [address]    Culture   Final    NO GROWTH 5 DAYS Performed at [facility], [place] [address] [place], [place] [address]    Report Status 06/18/2019 FINAL  Final  SARS Coronavirus 2 by RT PCR (hospital order, performed in [facility] hospital lab) Nasopharyngeal Nasopharyngeal Swab     Status: None   Collection Time: 06/17/19  5:00 PM   Specimen: Nasopharyngeal Swab  Result Value Ref [place] Status   SARS Coronavirus 2 NEGATIVE NEGATIVE Final    Comment: (NOTE) SARS-CoV-2 target nucleic acids are NOT DETECTED. The SARS-CoV-2 RNA is generally detectable in upper and lower respiratory  specimens during the acute phase of infection. The lowest concentration of SARS-CoV-2 viral copies this assay can detect is 250 copies / mL. A negative result does not preclude SARS-CoV-2 infection and should not be used as the sole basis for treatment or other patient management decisions.  A negative result may occur with improper specimen collection / handling, submission of specimen other than nasopharyngeal swab, presence of viral mutation(s) within the areas targeted by this assay, and inadequate number of viral copies (<250 copies / mL). A negative result must be combined with clinical observations, patient  history, and epidemiological information. Fact Sheet for Patients:   StrictlyIdeas.no Fact Sheet for Healthcare Providers: BankingDealers.co.za This test is not yet approved or cleared  by the [place] FDA and has been authorized for detection and/or diagnosis of SARS-CoV-2 by FDA under an Emergency Use Authorization (EUA).  This EUA will remain in effect (meaning this test can be used) for the duration of the COVID-19 declaration under Section 564(b)(1) of the Act, 21 U.S.C. section 360bbb-3(b)(1), unless the authorization is terminated or revoked sooner. Performed at [facility], [place] [address] [place], [place] [address]       Studies: No results found.  Scheduled Meds: . benzonatate  200 mg Oral BID  . Chlorhexidine Gluconate Cloth  6 each Topical Daily  . dexamethasone (DECADRON) injection  4 mg Intravenous Q12H  . fentaNYL  1 patch Transdermal Q72H  . metoprolol tartrate  12.5 mg Oral BID  . pantoprazole  40 mg Oral BID  . polyethylene glycol  17 g Oral Daily  . rivaroxaban  15 mg Oral BID WC   Followed by  . [START ON 07/08/2019] rivaroxaban  20 mg Oral Q supper  . senna  2 tablet Oral BID  . sodium chloride flush  10-40 mL Intracatheter Q12H    Continuous Infusions: . sodium chloride  75 mL/hr at 06/20/19 1258     LOS: 3 days     Kayleen Memos, MD [facility] Pager [phone]  If 7PM-7AM, please contact night-coverage [email or web address] Password [facility] 06/20/2019, 4:40 PM

## 2019-06-20 NOTE — Progress Notes (Signed)
Physical Therapy Treatment Patient Details Name: DEVERICK PRUSS MRN: [id number] DOB: 07/22/1985 Today's Date: 06/20/2019    History of Present Illness William Ali is a 34 y.o. 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  Patient was seen in the ED on 05/10/2019 with a headache and left pelvic pain.  He subsequently underwent CT of the C/A/P and MRI of the brain and was found to have widely metastatic disease with a right lung mass, lytic lesions throughout the thoracic and lumbar spine and pelvis, and numerous liver lesions.  Patient subsequently underwent liver biopsy on 05/16/2019 with pathology showing poorly differentiated adenocarcinoma.    PT Comments    Pt ambulated 170' without an assistive device, no loss of balance, HR 132 walking, SaO2 98% on room air. Pt reported L posterior thigh felt numb while ambulating, noted decreased sensation to light touch L anterior thigh. L hip flexion and ABD/ADDuction are weaker than R. Instructed pt in strengthening exercises for L hip, to be done independently. Pt reported dizziness and chest tightness after ambulating, he stated this has happened other times after physical activity, vital signs were stabile, RN aware of pt's symptoms.    Follow Up Recommendations  Home health PT;Supervision for mobility/OOB     Equipment Recommendations  Rolling walker with 5" wheels;3in1 (PT)    Recommendations for Other Services OT consult     Precautions / Restrictions Precautions Precautions: Fall Restrictions Weight Bearing Restrictions: No    Mobility  Bed Mobility Overal bed mobility: Needs Assistance Bed Mobility: Rolling;Sidelying to Sit Rolling: Supervision Sidelying to sit: Supervision       General bed mobility comments: cues for log rolling technique and proper body mechanics to decrease strain on back.  Transfers Overall transfer level: Needs  assistance Equipment used: Rolling walker (2 wheeled) Transfers: Sit to/from Stand Sit to Stand: Supervision         General transfer comment: cues for proper hand placement  Ambulation/Gait Ambulation/Gait assistance: Supervision Gait Distance (Feet): 170 Feet Assistive device: None Gait Pattern/deviations: Step-through pattern;Decreased stride length;Decreased weight shift to left Gait velocity: decr   General Gait Details: pt reported he didn't need RW so ambulated without AD, no loss of balance, pt reported dizziness and chest tightness after sitting down at end of bout of walking, VSS (HR 115, SaO2 100%, BP 150/89), reclined pt, RN notified   Stairs             Wheelchair Mobility    Modified Rankin (Stroke Patients Only)       Balance Overall balance assessment: Needs assistance Sitting-balance support: Feet supported;No upper extremity supported Sitting balance-Leahy Scale: Good     Standing balance support: Bilateral upper extremity supported;During functional activity Standing balance-Leahy Scale: Good Standing balance comment: ambulated without loss of balance                            Cognition Arousal/Alertness: Awake/alert Behavior During Therapy: WFL for tasks assessed/performed Overall Cognitive Status: Within Functional Limits for tasks assessed                                        Exercises General Exercises - Lower Extremity Gluteal Sets: AROM;Both;5 reps;Supine Hip Flexion/Marching: AROM;Left;5 reps;Seated    General Comments  Pertinent Vitals/Pain Pain Score: 5  Pain Location: back and stomach Pain Descriptors / Indicators: Aching Pain Intervention(s): Limited activity within patient's tolerance;Monitored during session;Premedicated before session    Home Living                      Prior Function            PT Goals (current goals can now be found in the care plan section)  Acute Rehab PT Goals Patient Stated Goal: walk, strengthen LLE PT Goal Formulation: With patient Time For Goal Achievement: 07/02/19 Potential to Achieve Goals: Good Progress towards PT goals: Progressing toward goals    Frequency    Min 3X/week      PT Plan Current plan remains appropriate    Co-evaluation              AM-PAC PT "6 Clicks" Mobility   Outcome Measure  Help needed turning from your back to your side while in a flat bed without using bedrails?: A Little Help needed moving from lying on your back to sitting on the side of a flat bed without using bedrails?: A Little Help needed moving to and from a bed to a chair (including a wheelchair)?: A Little Help needed standing up from a chair using your arms (e.g., wheelchair or bedside chair)?: A Little Help needed to walk in hospital room?: A Little Help needed climbing 3-5 steps with a railing? : A Lot 6 Click Score: 17    End of Session Equipment Utilized During Treatment: Gait belt Activity Tolerance: Patient tolerated treatment well Patient left: in chair;with call bell/phone within reach;with nursing/sitter in room(wife aware that if she leaves he needs chair alarm on) Nurse Communication: Mobility status PT Visit Diagnosis: Unsteadiness on feet (R26.81);Other abnormalities of gait and mobility (R26.89)     Time: 3244-0102 PT Time Calculation (min) (ACUTE ONLY): 21 min  Charges:  $Gait Training: 8-22 mins                     Blondell Reveal Kistler PT 06/20/2019  Acute Rehabilitation Services Pager [phone] Office [phone]

## 2019-06-20 NOTE — Progress Notes (Signed)
Patient complained of worsening pain in back and abdomen. On-call provider paged to assist. V.O received to give Dilaudid 1 mg IV for one more dose, now.

## 2019-06-20 NOTE — Progress Notes (Signed)
Occupational Therapy Treatment Patient Details Name: William Ali MRN: [id number] DOB: November 04, 1985 Today's Date: 06/20/2019    History of present illness William Ali is a 34 y.o. 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  Patient was seen in the ED on 05/10/2019 with a headache and left pelvic pain.  He subsequently underwent CT of the C/A/P and MRI of the brain and was found to have widely metastatic disease with a right lung mass, lytic lesions throughout the thoracic and lumbar spine and pelvis, and numerous liver lesions.  Patient subsequently underwent liver biopsy on 05/16/2019 with pathology showing poorly differentiated adenocarcinoma.   OT comments   OP OT for vision stuff? Will continue to evaluate           Precautions / Restrictions Precautions Precautions: Fall Restrictions Weight Bearing Restrictions: No       Mobility Bed Mobility Overal bed mobility: Needs Assistance Bed Mobility: Rolling;Sidelying to Sit Rolling: Supervision Sidelying to sit: Supervision       General bed mobility comments: pt OOB  Transfers Overall transfer level: Needs assistance Equipment used: Rolling walker (2 wheeled) Transfers: Sit to/from [facility] Sit to Stand: Supervision Stand pivot transfers: Supervision       General transfer comment: cues for proper hand placement    Balance Overall balance assessment: Needs assistance Sitting-balance support: Feet supported;No upper extremity supported Sitting balance-Leahy Scale: Good     Standing balance support: Bilateral upper extremity supported;During functional activity Standing balance-Leahy Scale: Good Standing balance comment: ambulated without loss of balance                           ADL either performed or assessed with clinical judgement   ADL       Grooming: Standing;Wash/dry face;Min guard                    Toilet Transfer: Minimal assistance;RW;Cueing for safety;Cueing for sequencing;Stand-pivot   Toileting- Clothing Manipulation and Hygiene: Minimal assistance;Sit to/from stand               Vision Baseline Vision/History: (patch on L eye due to double vision) Patient Visual Report: Eye fatigue/eye pain/headache;Nausea/blurring vision with head movement;Diplopia Vision Assessment?: Vision impaired- to be further tested in functional context Additional Comments: pt able to use phone but stated it made him feel sick at times- like motion sick.  Pt dos keep eyes closed often due to eye pain and discomfort.  Educated pt on focusing exercise with head turns L and R. Pt only able to tolerate 3 head turns.          Cognition Arousal/Alertness: Awake/alert Behavior During Therapy: WFL for tasks assessed/performed Overall Cognitive Status: Within Functional Limits for tasks assessed                                                     Pertinent Vitals/ Pain       Pain Score: 2  Pain Location: back and stomach Pain Descriptors / Indicators: Discomfort Pain Intervention(s): Limited activity within patient's tolerance;Repositioned   Progress Toward Goals  OT Goals(current goals can now be found in the care plan section)     Acute Rehab OT Goals Patient Stated Goal: walk,  strengthen LLE  Plan Discharge plan remains appropriate          End of Session Equipment Utilized During Treatment: Rolling walker  OT Visit Diagnosis: Muscle weakness (generalized) (M62.81)   Activity Tolerance Patient tolerated treatment well   Patient Left in chair;with family/visitor present;with call bell/phone within reach   Nurse Communication Mobility status        Time: 1356-1416 OT Time Calculation (min): 20 min  Charges: OT General Charges $OT Visit: 1 Visit OT Treatments $Self Care/Home Management : 8-22 mins  Kari Baars, [place] [phone] Office- [phone]      Tamella Tuccillo, Edwena Felty D 06/20/2019, 3:42 PM

## 2019-06-21 ENCOUNTER — Encounter: Payer: Self-pay | Admitting: Radiation Oncology

## 2019-06-21 ENCOUNTER — Inpatient Hospital Stay (HOSPITAL_COMMUNITY): Payer: PRIVATE HEALTH INSURANCE

## 2019-06-21 ENCOUNTER — Ambulatory Visit
Admission: RE | Admit: 2019-06-21 | Discharge: 2019-06-21 | Disposition: A | Discharge status: Home / Self Care | Payer: PRIVATE HEALTH INSURANCE | Source: Ambulatory Visit | Attending: Radiation Oncology | Admitting: Radiation Oncology

## 2019-06-21 ENCOUNTER — Inpatient Hospital Stay: Payer: Medicaid Other

## 2019-06-21 DIAGNOSIS — I2694 Multiple subsegmental pulmonary emboli without acute cor pulmonale: Secondary | ICD-10-CM

## 2019-06-21 DIAGNOSIS — C7949 Secondary malignant neoplasm of other parts of nervous system: Principal | ICD-10-CM

## 2019-06-21 LAB — CBC WITH DIFFERENTIAL/PLATELET
Abs Immature Granulocytes: 0.16 10*3/uL — ABNORMAL HIGH (ref 0.00–0.07)
Basophils Absolute: 0 10*3/uL (ref 0.0–0.1)
Basophils Relative: 0 %
Eosinophils Absolute: 0 10*3/uL (ref 0.0–0.5)
Eosinophils Relative: 0 %
HCT: 32.6 % — ABNORMAL LOW (ref 39.0–52.0)
Hemoglobin: 10.4 g/dL — ABNORMAL LOW (ref 13.0–17.0)
Immature Granulocytes: 2 %
Lymphocytes Relative: 1 %
Lymphs Abs: 0.1 10*3/uL — ABNORMAL LOW (ref 0.7–4.0)
MCH: 28 pg (ref 26.0–34.0)
MCHC: 31.9 g/dL (ref 30.0–36.0)
MCV: 87.9 fL (ref 80.0–100.0)
Monocytes Absolute: 0.4 10*3/uL (ref 0.1–1.0)
Monocytes Relative: 6 %
Neutro Abs: 6.2 10*3/uL (ref 1.7–7.7)
Neutrophils Relative %: 91 %
Platelets: 120 10*3/uL — ABNORMAL LOW (ref 150–400)
RBC: 3.71 MIL/uL — ABNORMAL LOW (ref 4.22–5.81)
RDW: 17.3 % — ABNORMAL HIGH (ref 11.5–15.5)
WBC: 6.9 10*3/uL (ref 4.0–10.5)
nRBC: 0.3 % — ABNORMAL HIGH (ref 0.0–0.2)

## 2019-06-21 LAB — COMPREHENSIVE METABOLIC PANEL
ALT: 241 U/L — ABNORMAL HIGH (ref 0–44)
AST: 78 U/L — ABNORMAL HIGH (ref 15–41)
Albumin: 2.8 g/dL — ABNORMAL LOW (ref 3.5–5.0)
Alkaline Phosphatase: 340 U/L — ABNORMAL HIGH (ref 38–126)
Anion gap: 9 (ref 5–15)
BUN: 21 mg/dL — ABNORMAL HIGH (ref 6–20)
CO2: 26 mmol/L (ref 22–32)
Calcium: 8.5 mg/dL — ABNORMAL LOW (ref 8.9–10.3)
Chloride: 107 mmol/L (ref 98–111)
Creatinine, Ser: 1.19 mg/dL (ref 0.61–1.24)
GFR calc Af Amer: >60 mL/min (ref >60)
GFR calc non Af Amer: >60 mL/min (ref >60)
Glucose, Bld: 142 mg/dL — ABNORMAL HIGH (ref 70–99)
Potassium: 4.4 mmol/L (ref 3.5–5.1)
Sodium: 142 mmol/L (ref 135–145)
Total Bilirubin: 0.6 mg/dL (ref 0.3–1.2)
Total Protein: 5.9 g/dL — ABNORMAL LOW (ref 6.5–8.1)

## 2019-06-21 IMAGING — US US ABDOMEN COMPLETE
1 series · 13 of 25 positions shown · non-contrast
Comparison: CT abdomen pelvis [DATE]

CLINICAL DATA: Postprandial abdominal pain. Metastatic lung cancer.

EXAM:
ABDOMEN ULTRASOUND COMPLETE

[Series 1: us abdomen complete · 13 of 108 slices shown]
[im 1/108]
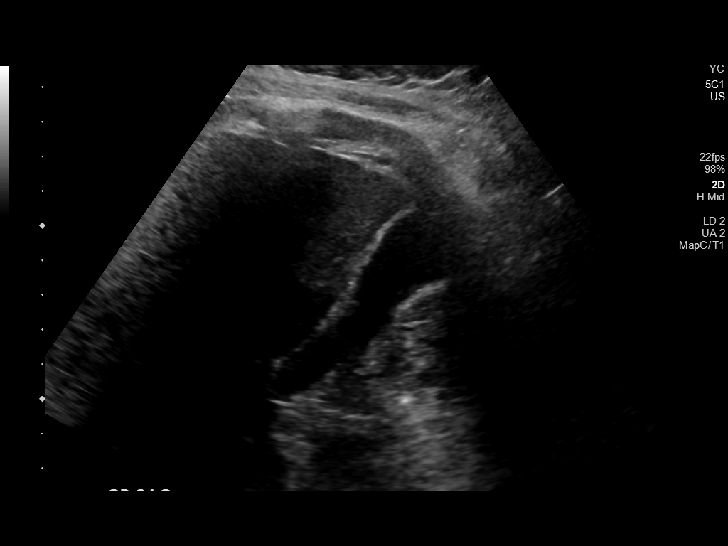
[im 9/108]
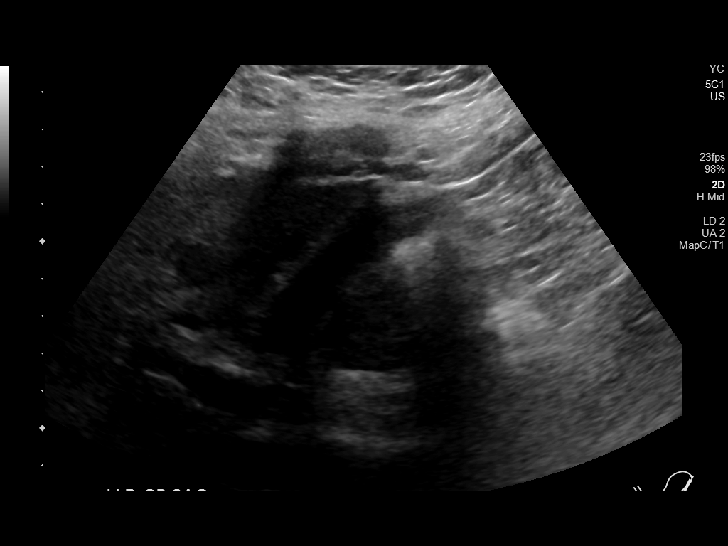
[im 18/108]
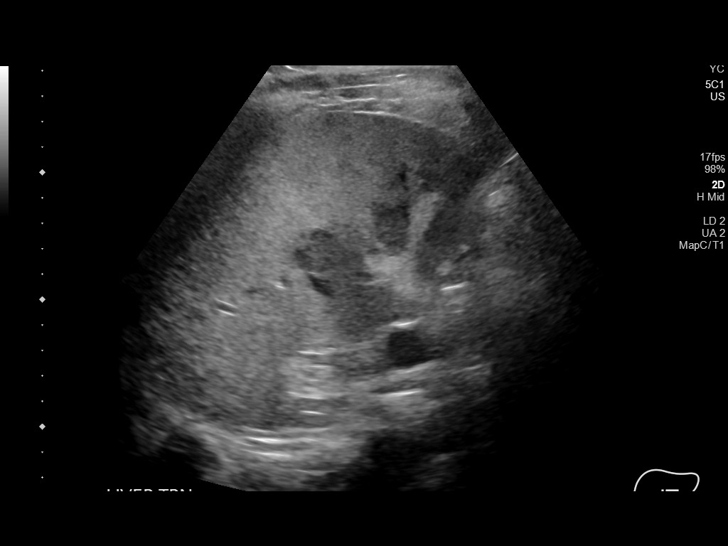
[im 27/108]
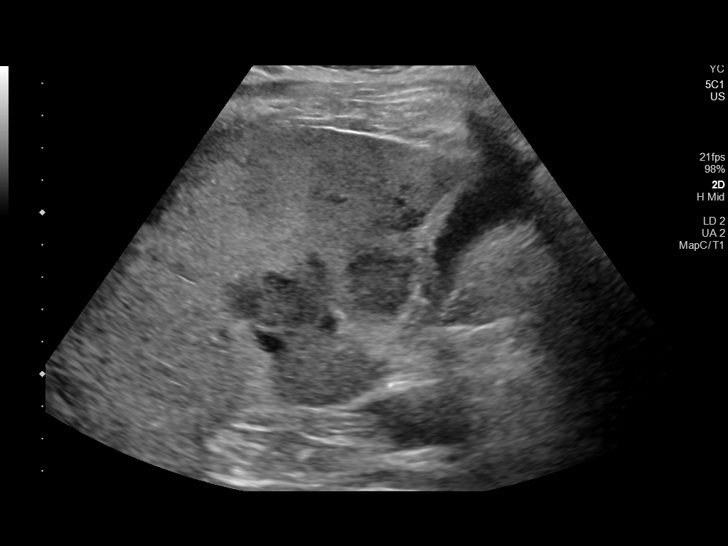
[im 36/108]
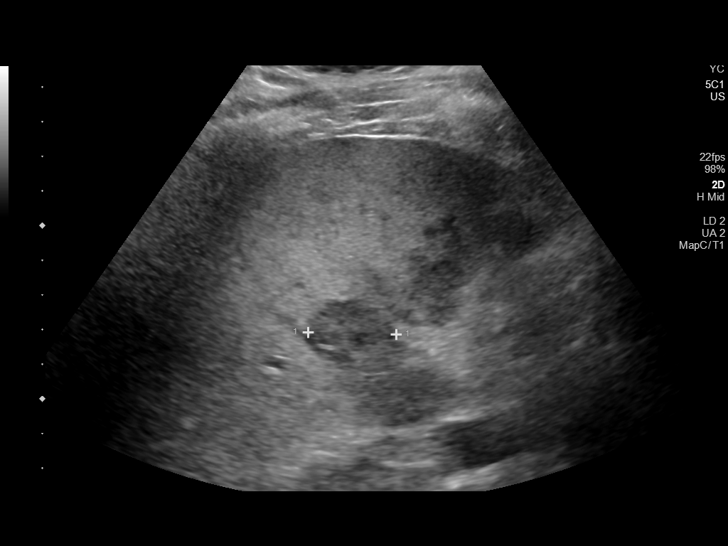
[im 45/108]
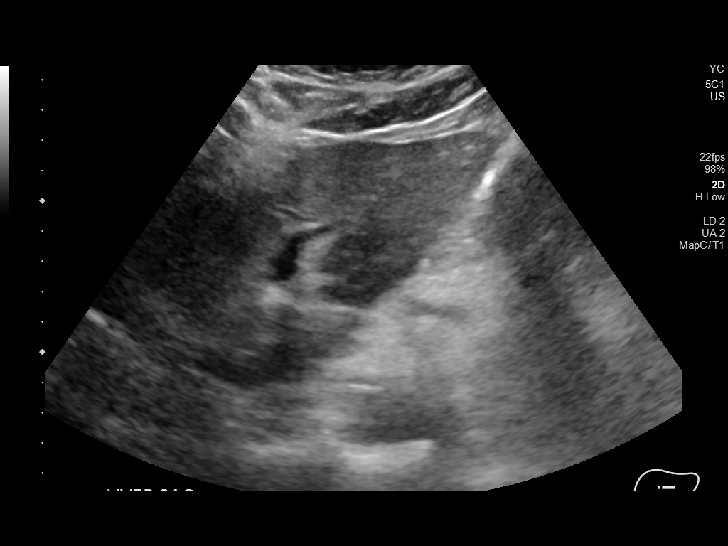
[im 54/108]
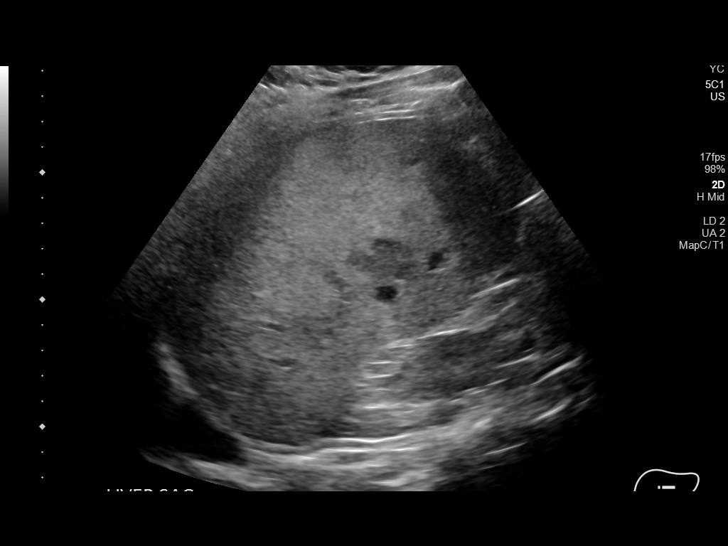
[im 63/108]
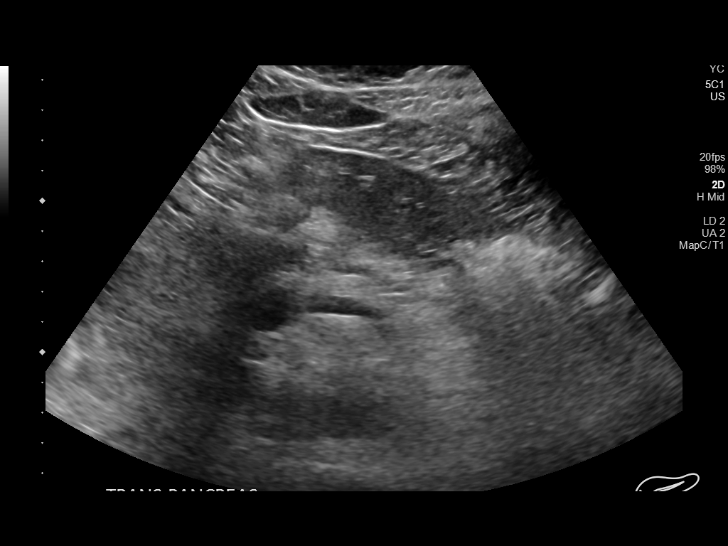
[im 72/108]
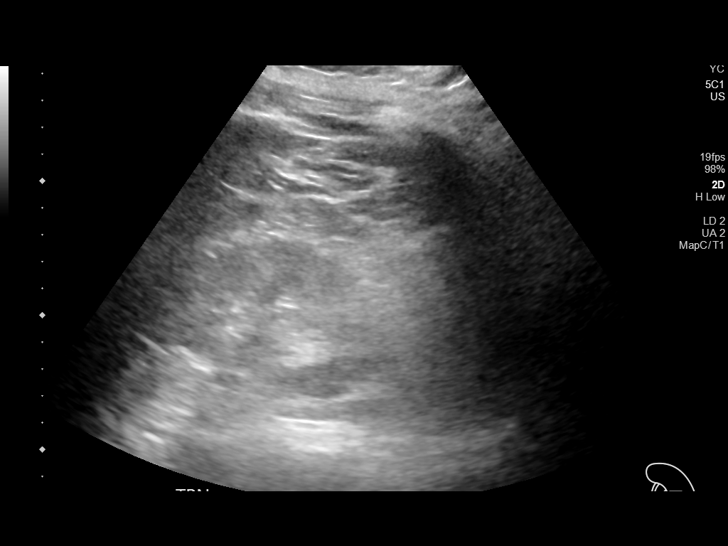
[im 81/108]
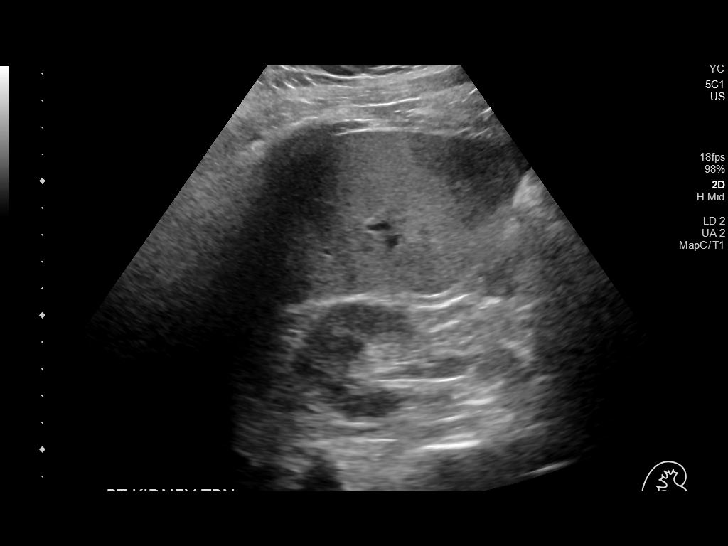
[im 90/108]
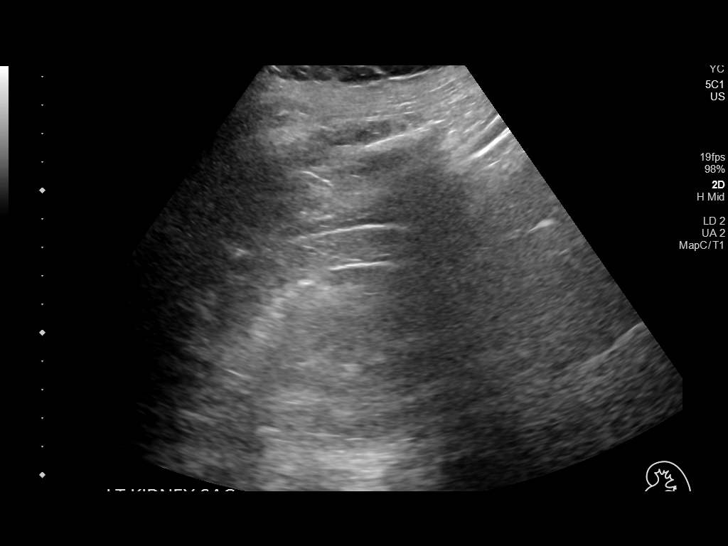
[im 99/108]
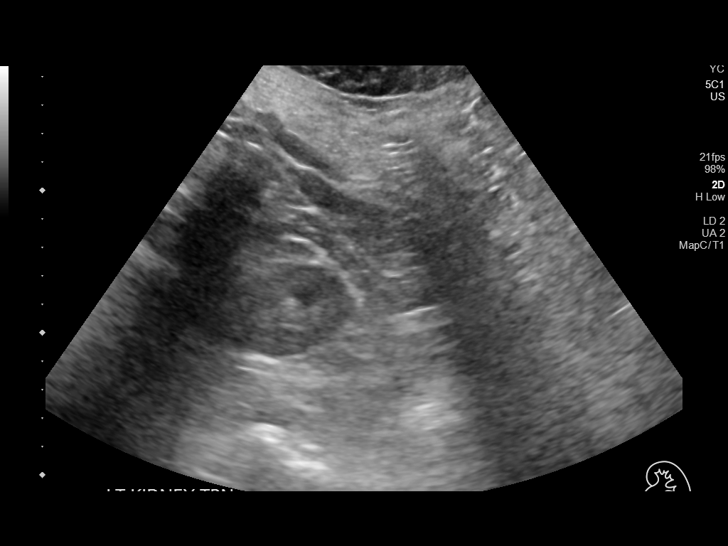
[im 108/108]
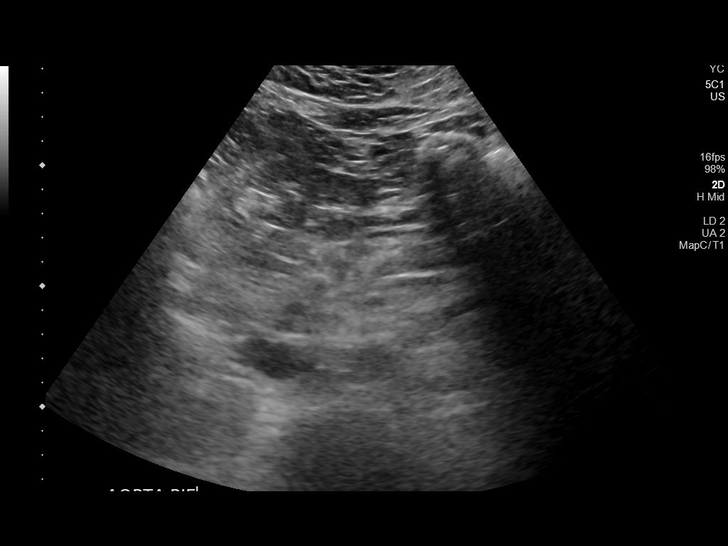

[13 of 25 positions shown; findings below may reference images not displayed]

FINDINGS: Gallbladder: No gallstones or wall thickening visualized. No
sonographic Murphy sign noted by sonographer.

Common bile duct: Diameter: 2.7 mm

Liver: Liver parenchyma demonstrates increased echogenicity
diffusely. Multiple liver masses are present compatible metastatic
disease as noted on prior CT. Largest lesions in the right lobe
measure 3.7 x 2.6 x 1.7 cm, and 3.8 x 3.5 x 3.5 cm. Multiple smaller
lesions also present in the liver. Portal vein is patent on color
Doppler imaging with normal direction of blood flow towards the
liver.

IVC: No abnormality visualized.

Pancreas: Visualized portion unremarkable.

Spleen: Size and appearance within normal limits.

Right Kidney: Length: 9.7 cm. Echogenicity within normal limits. No
mass or hydronephrosis visualized.

Left Kidney: Length: 10.4 cm. Echogenicity within normal limits. No
mass or hydronephrosis visualized.

Abdominal aorta: No aneurysm visualized.

Other findings: Small right pleural effusion
IMPRESSION: Negative for gallstones

Multiple hepatic metastasis as noted on CT. Fatty infiltration liver
is noted.

Small right pleural effusion.

## 2019-06-21 MED ORDER — HYDROMORPHONE HCL 1 MG/ML IJ SOLN
2.0000 mg | INTRAMUSCULAR | Status: DC | PRN
  Administered 2019-06-21 – 2019-06-28 (×41): 2 mg via INTRAVENOUS
  Filled 2019-06-21 (×42): qty 2

## 2019-06-21 MED ORDER — KETOROLAC TROMETHAMINE 30 MG/ML IJ SOLN
30.0000 mg | Freq: Once | INTRAMUSCULAR | Status: AC
  Administered 2019-06-21: 30 mg via INTRAVENOUS
  Filled 2019-06-21: qty 1

## 2019-06-21 NOTE — Progress Notes (Signed)
OT Cancellation Note  Patient Details Name: MACINTYRE ALEXA MRN: [id number] DOB: 05/01/1985   Cancelled Treatment:    Reason Eval/Treat Not Completed: Patient at procedure or test/ unavailable. First attempt in AM patient eating breakfast, second attempt in PM patient off floor. Will re-attempt 5/27 as schedule permits.  Delbert Phenix OT Pager: [phone] 06/21/2019, 2:42 PM

## 2019-06-21 NOTE — Progress Notes (Signed)
PROGRESS NOTE  William Ali SNK:[id number] DOB: 28-Aug-1985 DOA: 06/17/2019 PCP: Patient, No Pcp Per  HPI/Recap of past 24 hours: William J Carsonis a 34 y.o.maleveryunfortunate young manwith medical history significant ofmetastatic lung cancer who presents to the emergency department with complaints of headache,chest pain,back pain, abdominal pain. He was bothered by pain in upper back and chest. He was also having abdominal discomfort and severe headache. There is no report of fever, chills, nausea, vomiting, dysuria or shortness of breath.He has been currently treated with radiation therapy and is starting on chemotherapy soon. He was diagnosed with DVT on 06/04/2019 and was diagnosed with PE on his last admission. He was treated with heparin drip for his PE and was discharged on Xarelto. He also recently had a Chemo-Port placement on 06/12/2019. On presentation, he was in sinus tachycardia, quite hypertensive most likely secondary to pain. X-rays show continued extensive, expanding tumor burden. Patient was admitted for pain management, supportive care and possibly starting urgent radiation therapy for his cord compression on the T-spine.He has been started on Decadron. Palliative care consultation. Oncology and radiation oncology consulted.  PMT consulted for cancer related pain management.    Today, patient reports pain is controlled, pain is usually more in his abdomen.  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 His oncologist Dr. Lorenso Courier following Management directed by oncology and palliative care Chemo-Port in place on right chest. Cancer related pain management per  PMT  T-spine cord compression Continue IV Decadron 4 mg twice daily Continue pain management as recommended by PMT  DVT/PE, recently diagnosed Continue oral anticoagulation, Xarelto O2 saturation 99% on room air  Intractable cough Continue Tessalon Perles  Transaminitis Likely from mets to liver Daily CMP  Anemia of chronic disease in the setting of malignancy/thrombocytopenia Hemoglobin stable with no overt bleeding Daily CBC  Headache/left eye vision loss Primary adenocarcinoma the right with mets to the brain and left retina  Goals of care Palliative care team following Currently a full code    DVT prophylaxis: [id number]  Code Status: Full code  Family Communication:  Discussed with wife at bedside on 06/21/2019    Consultants:   Palliative care  Neurosurgery  Radiation Oncology  Medical Oncology  Procedures:  None     Status is: Inpatient   Dispo: The patient is from: Home.              Anticipated d/c is to: Home in the next 24-48 hours               Anticipated d/c date is: 06/23/2019               Patient currently ongoing oncology treatment and pain management with IV pain medications.        Objective: Vitals:   06/20/19 2115 06/21/19 0616 06/21/19 1317 06/21/19 1514  BP: 132/80 (!) 141/83 (!) 144/83 (!) 149/90  Pulse: 89 82 92 83  Resp: 18 18 18 17   Temp: 98.4 F (36.9 C) 97.7 F (36.5 C) (!) 97.5 F (36.4 C) 98.2 F (36.8 C)  TempSrc: Oral Oral Oral Oral  SpO2: 98% 100% 99% 99%  Weight:      Height:  Intake/Output Summary (Last 24 hours) at 06/21/2019 1817 Last data filed at 06/21/2019 1025 Gross per 24 hour  Intake 1042.15 ml  Output 675 ml  Net 367.15 ml   Filed Weights   06/17/19 1821  Weight: 99 kg    Exam:  General: NAD   Cardiovascular: S1, S2 present  Respiratory: CTAB  Abdomen: Soft, TTP, nondistended, bowel sounds present  Musculoskeletal: No bilateral pedal edema noted  Skin:  Normal  Psychiatry: Normal mood   Data Reviewed: CBC: Recent Labs  Lab 06/17/19 1418 06/18/19 0450 06/19/19 0438 06/20/19 0935 06/21/19 0333  WBC 6.6 7.0 8.3 7.4 6.9  NEUTROABS  --   --   --  6.5 6.2  HGB 11.0* 11.0* 9.7* 10.0* 10.4*  HCT 34.6* 35.3* 30.8* 31.9* 32.6*  MCV 86.9 88.0 87.0 86.9 87.9  PLT 113* 114* 122* 134* 527*   Basic Metabolic Panel: Recent Labs  Lab 06/17/19 1418 06/18/19 0450 06/19/19 0820 06/20/19 0935 06/21/19 0333  NA 139 140 142 140 142  K 3.6 4.7 4.0 4.1 4.4  CL 102 105 107 104 107  CO2 25 25 25 26 26   GLUCOSE 138* 224* 133* 182* 142*  BUN 22* 22* 22* 22* 21*  CREATININE 1.22 1.43* 1.19 1.20 1.19  CALCIUM 8.7* 8.9 8.5* 8.4* 8.5*  MG  --   --  1.9  --   --   PHOS  --   --  4.4  --   --    GFR: Estimated Creatinine Clearance: 98.1 mL/min (by C-G formula based on SCr of 1.19 mg/dL). Liver Function Tests: Recent Labs  Lab 06/18/19 0450 06/19/19 0820 06/20/19 0935 06/21/19 0333  AST 146* 76* 57* 78*  ALT 355* 275* 243* 241*  ALKPHOS 328* 298* 331* 340*  BILITOT 0.6 <0.1* 0.5 0.6  PROT 6.1* 5.7* 5.8* 5.9*  ALBUMIN 2.9* 2.7* 2.8* 2.8*   No results for input(s): LIPASE, AMYLASE in the last 168 hours. No results for input(s): AMMONIA in the last 168 hours. Coagulation Profile: No results for input(s): INR, PROTIME in the last 168 hours. Cardiac Enzymes: No results for input(s): CKTOTAL, CKMB, CKMBINDEX, TROPONINI in the last 168 hours. BNP (last 3 results) No results for input(s): PROBNP in the last 8760 hours. HbA1C: No results for input(s): HGBA1C in the last 72 hours. CBG: No results for input(s): GLUCAP in the last 168 hours. Lipid Profile: No results for input(s): CHOL, HDL, LDLCALC, TRIG, CHOLHDL, LDLDIRECT in the last 72 hours. Thyroid Function Tests: No results for input(s): TSH, T4TOTAL, FREET4, T3FREE, THYROIDAB in the last 72 hours. Anemia Panel: No results for input(s): VITAMINB12, FOLATE, FERRITIN, TIBC, IRON,  RETICCTPCT in the last 72 hours. Urine analysis:    Component Value Date/Time   COLORURINE YELLOW 06/02/2019 1838   APPEARANCEUR CLEAR 06/02/2019 1838   LABSPEC 1.016 06/02/2019 1838   PHURINE 5.0 06/02/2019 1838   GLUCOSEU NEGATIVE 06/02/2019 1838   HGBUR LARGE (A) 06/02/2019 1838   BILIRUBINUR NEGATIVE 06/02/2019 1838   KETONESUR NEGATIVE 06/02/2019 1838   PROTEINUR NEGATIVE 06/02/2019 1838   UROBILINOGEN 1.0 05/02/2014 1253   NITRITE NEGATIVE 06/02/2019 1838   LEUKOCYTESUR NEGATIVE 06/02/2019 1838   Sepsis Labs: @LABRCNTIP (procalcitonin:4,lacticidven:4)  ) Recent Results (from the past 240 hour(s))  SARS Coronavirus 2 by RT PCR (hospital order, performed in [place] hospital lab) Nasopharyngeal Nasopharyngeal Swab     Status: None   Collection Time: 06/13/19  1:42 PM   Specimen: Nasopharyngeal Swab  Result Value Ref Range Status  SARS Coronavirus 2 NEGATIVE NEGATIVE Final    Comment: (NOTE) SARS-CoV-2 target nucleic acids are NOT DETECTED. The SARS-CoV-2 RNA is generally detectable in upper and lower respiratory specimens during the acute phase of infection. The lowest concentration of SARS-CoV-2 viral copies this assay can detect is 250 copies / mL. A negative result does not preclude SARS-CoV-2 infection and should not be used as the sole basis for treatment or other patient management decisions.  A negative result may occur with improper specimen collection / handling, submission of specimen other than nasopharyngeal swab, presence of viral mutation(s) within the areas targeted by this assay, and inadequate number of viral copies (<250 copies / mL). A negative result must be combined with clinical observations, patient history, and epidemiological information. Fact Sheet for Patients:   StrictlyIdeas.no Fact Sheet for Healthcare Providers: BankingDealers.co.za This test is not yet approved or cleared  by the [place] FDA and has been authorized for detection and/or diagnosis of SARS-CoV-2 by FDA under an Emergency Use Authorization (EUA).  This EUA will remain in effect (meaning this test can be used) for the duration of the COVID-19 declaration under Section 564(b)(1) of the Act, 21 U.S.C. section 360bbb-3(b)(1), unless the authorization is terminated or revoked sooner. Performed at [facility], [place] [address], [place], [place] [address]   Blood culture (routine x 2)     Status: None   Collection Time: 06/13/19  6:35 PM   Specimen: BLOOD  Result Value Ref Range Status   Specimen Description   Final    BLOOD PORTA CATH Performed at [facility] [address], [place], [place] [address]    Special Requests   Final    BOTTLES DRAWN AEROBIC AND ANAEROBIC Blood Culture adequate volume Performed at [facility] [address] [place], [place] [address]    Culture   Final    NO GROWTH 5 DAYS Performed at [facility], [place] [address], [place],  [address]    Report Status 06/18/2019 FINAL  Final  SARS Coronavirus 2 by RT PCR (hospital order, performed in [facility] hospital lab) Nasopharyngeal Nasopharyngeal Swab     Status: None   Collection Time: 06/17/19  5:00 PM   Specimen: Nasopharyngeal Swab  Result Value Ref Range Status   SARS Coronavirus 2 NEGATIVE NEGATIVE Final    Comment: (NOTE) SARS-CoV-2 target nucleic acids are NOT DETECTED. The SARS-CoV-2 RNA is generally detectable in upper and lower respiratory specimens during the acute phase of infection. The lowest concentration of SARS-CoV-2 viral copies this assay can detect is 250 copies / mL. A negative result does not preclude SARS-CoV-2 infection and should not be used as the sole basis for treatment or other patient management decisions.  A negative result may occur with improper specimen collection / handling, submission of specimen other than  nasopharyngeal swab, presence of viral mutation(s) within the areas targeted by this assay, and inadequate number of viral copies (<250 copies / mL). A negative result must be combined with clinical observations, patient history, and epidemiological information. Fact Sheet for Patients:   StrictlyIdeas.no Fact Sheet for Healthcare Providers: BankingDealers.co.za This test is not yet approved or cleared  by the [place] FDA and has been authorized for detection and/or diagnosis of SARS-CoV-2 by FDA under an Emergency Use Authorization (EUA).  This EUA will remain in effect (meaning this test can be used) for the duration of the COVID-19 declaration under Section 564(b)(1) of the Act,  21 U.S.C. section 360bbb-3(b)(1), unless the authorization is terminated or revoked sooner. Performed at [facility], [place] [address] [place], [place] [address]       Studies: US Abdomen Complete  Result Date: 06/21/2019 CLINICAL DATA:  Postprandial abdominal pain. Metastatic lung cancer. EXAM: ABDOMEN ULTRASOUND COMPLETE COMPARISON:  CT abdomen pelvis 05/10/2019 FINDINGS: Gallbladder: No gallstones or wall thickening visualized. No sonographic Murphy sign noted by sonographer. Common bile duct: Diameter: 2.7 mm Liver: Liver parenchyma demonstrates increased echogenicity diffusely. Multiple liver masses are present compatible metastatic disease as noted on prior CT. Largest lesions in the right lobe measure 3.7 x 2.6 x 1.7 cm, and 3.8 x 3.5 x 3.5 cm. Multiple smaller lesions also present in the liver. Portal vein is patent on color Doppler imaging with normal direction of blood flow towards the liver. IVC: No abnormality visualized. Pancreas: Visualized portion unremarkable. Spleen: Size and appearance within normal limits. Right Kidney: Length: 9.7 cm. Echogenicity within normal limits. No mass or hydronephrosis visualized. Left Kidney:  Length: 10.4 cm. Echogenicity within normal limits. No mass or hydronephrosis visualized. Abdominal aorta: No aneurysm visualized. Other findings: Small right pleural effusion IMPRESSION: Negative for gallstones Multiple hepatic metastasis as noted on CT. Fatty infiltration liver is noted. Small right pleural effusion. Electronically Signed   By: Franchot Gallo M.D.   On: 06/21/2019 09:08    Scheduled Meds: . benzonatate  200 mg Oral BID  . Chlorhexidine Gluconate Cloth  6 each Topical Daily  . dexamethasone (DECADRON) injection  4 mg Intravenous Q12H  . fentaNYL  1 patch Transdermal Q72H  .  HYDROmorphone (DILAUDID) injection  1 mg Intravenous Once  . metoprolol tartrate  12.5 mg Oral BID  . pantoprazole  40 mg Oral BID  . polyethylene glycol  17 g Oral Daily  . rivaroxaban  15 mg Oral BID WC   Followed by  . [START ON 07/08/2019] rivaroxaban  20 mg Oral Q supper  . senna  2 tablet Oral BID  . sodium chloride flush  10-40 mL Intracatheter Q12H    Continuous Infusions: . sodium chloride 75 mL/hr at 06/21/19 1003     LOS: 4 days     Alma Friendly, MD [facility]  If 7PM-7AM, please contact night-coverage [email or web address] 06/21/2019, 6:17 PM

## 2019-06-21 NOTE — Progress Notes (Signed)
Palliative Care Progress Note   William Ali continues to have pain and is requiring multiple doses of prn hydromorphone. He describes his pain as mostly in his abdomen. He thinks the pain is caused from the decadron. On further history his pain appears to also be post-prandial occurring about 30 minutes after he eats food but is not associated with nausea,vomiting or diarrhea. It is crampy pain and lasts for about 15 minutes then resolves. He recently had issues with constipation but that has largely resolved with a better bowel regimen. He is also on high dose PPI for GI protection. I reviewed his most recent CT imaging - he has significant liver mets and involvement of his thoracic spine with a T3 deformity. He reports less pain with IV decadron but is now requesting hydromorphone prn to be given with the steroid which is helping.  1. Abdominal Pain, not fully characterized, may be referred visceral pain, capsular pain from liver mets and distention or dermatomal pain from spinal mets, but based on his history I am concerned about biliary colic or possible gastric ulcer pain.  Increased Duragesic to 56mcg  Continue PRN hydromorphone  Complete Abdominal Ultrasound to evaluate for additional pain etiologies not visible on CT scan  Continue high dose PPI  Aggressive bowel regimen  Consider adding on a COX-2 NSAID like Celebrex for capsular pain if not contraindicated with cancer treatment plan  Radiation may improve his pain if related to spinal segmental disease  2. Goals of Care Patient is coping fairly well with his diagnosis-he is taking this one day at a time-its hard for him to talk about the future. He hopes to get his pain under control so he can get out of the hospital. Planning on initiating chemotherapy Friday 5/28. Will continue to support him through hospitalization and see in outpatient in cancer center as needed.  [name], DO Palliative Medicine [phone]  Time: 35  min Greater than 50%  of this time was spent counseling and coordinating care related to the above assessment and plan.

## 2019-06-22 ENCOUNTER — Inpatient Hospital Stay: Payer: Medicaid Other

## 2019-06-22 LAB — CBC WITH DIFFERENTIAL/PLATELET
Abs Immature Granulocytes: 0.14 10*3/uL — ABNORMAL HIGH (ref 0.00–0.07)
Basophils Absolute: 0 10*3/uL (ref 0.0–0.1)
Basophils Relative: 0 %
Eosinophils Absolute: 0 10*3/uL (ref 0.0–0.5)
Eosinophils Relative: 0 %
HCT: 32.5 % — ABNORMAL LOW (ref 39.0–52.0)
Hemoglobin: 10.1 g/dL — ABNORMAL LOW (ref 13.0–17.0)
Immature Granulocytes: 2 %
Lymphocytes Relative: 2 %
Lymphs Abs: 0.1 10*3/uL — ABNORMAL LOW (ref 0.7–4.0)
MCH: 27.4 pg (ref 26.0–34.0)
MCHC: 31.1 g/dL (ref 30.0–36.0)
MCV: 88.1 fL (ref 80.0–100.0)
Monocytes Absolute: 0.6 10*3/uL (ref 0.1–1.0)
Monocytes Relative: 7 %
Neutro Abs: 6.9 10*3/uL (ref 1.7–7.7)
Neutrophils Relative %: 89 %
Platelets: 125 10*3/uL — ABNORMAL LOW (ref 150–400)
RBC: 3.69 MIL/uL — ABNORMAL LOW (ref 4.22–5.81)
RDW: 17.2 % — ABNORMAL HIGH (ref 11.5–15.5)
WBC: 7.7 10*3/uL (ref 4.0–10.5)
nRBC: 0 % (ref 0.0–0.2)

## 2019-06-22 LAB — COMPREHENSIVE METABOLIC PANEL
ALT: 217 U/L — ABNORMAL HIGH (ref 0–44)
AST: 64 U/L — ABNORMAL HIGH (ref 15–41)
Albumin: 2.6 g/dL — ABNORMAL LOW (ref 3.5–5.0)
Alkaline Phosphatase: 319 U/L — ABNORMAL HIGH (ref 38–126)
Anion gap: 14 (ref 5–15)
BUN: 22 mg/dL — ABNORMAL HIGH (ref 6–20)
CO2: 23 mmol/L (ref 22–32)
Calcium: 8.1 mg/dL — ABNORMAL LOW (ref 8.9–10.3)
Chloride: 106 mmol/L (ref 98–111)
Creatinine, Ser: 1.15 mg/dL (ref 0.61–1.24)
GFR calc Af Amer: >60 mL/min (ref >60)
GFR calc non Af Amer: >60 mL/min (ref >60)
Glucose, Bld: 163 mg/dL — ABNORMAL HIGH (ref 70–99)
Potassium: 4.3 mmol/L (ref 3.5–5.1)
Sodium: 143 mmol/L (ref 135–145)
Total Bilirubin: 0.4 mg/dL (ref 0.3–1.2)
Total Protein: 5.6 g/dL — ABNORMAL LOW (ref 6.5–8.1)

## 2019-06-22 MED ORDER — FOLIC ACID 1 MG PO TABS
1.0000 mg | ORAL_TABLET | Freq: Every day | ORAL | 3 refills | Status: DC
Start: 2019-06-22 — End: 2019-06-29

## 2019-06-22 MED FILL — Dexamethasone Sodium Phosphate Inj 100 MG/10ML: INTRAMUSCULAR | Qty: 1 | Status: AC

## 2019-06-22 MED FILL — Fosaprepitant Dimeglumine For IV Infusion 150 MG (Base Eq): INTRAVENOUS | Qty: 5 | Status: AC

## 2019-06-22 NOTE — Progress Notes (Signed)
HEMATOLOGY-ONCOLOGY PROGRESS NOTE  Hematological/Oncological History #MetastaticAdenocarcinoma of the Lungwith Brain, Osseus,and Liver Metastasis 1)04/24/2018: Chest radiograph shows a 1.7 cm nodular density. Short term f/u of this lesion was recommended. CXR on 05/01/2018 performed with recommended f/u imaging. 2) 05/10/2019: presented to the ED with headache and left pelvis pain. Patient underwent CT C/A/P and MRI brain. Brain MRI showed numerous enhancing parenchymal intracranial lesions likely reflecting metastases. CT scan showedcavitary lesion in therightlung apex with spiculated margins. Additional sites of disease include lymph nodes of the chest and numerous liver mets. In the bones there weremultiple lytic appearing lesions throughout the thoracic and lumbar spine as well as portions of the bony pelvis. Referred to Oncology. 3) 05/16/2019: US biopsy of the liver performed. Findings consistent with a poorly differentiated adenocarcinoma.  4) 05/19/2019: establish care with Dr. Lorenso Courier  SUBJECTIVE: Reports ongoing pain to his back, and right upper quadrant.  He received a dose of Toradol yesterday which he states helped very briefly.  He reports generalized weakness in his lower extremities.  He is able to get up and walk around the room a little bit but overall reports that due to weakness, he is not walking much.  He still reports intermittent headaches.  Reports nausea but no vomiting.  Denies constipation and diarrhea.  Oncology History  Metastatic adenocarcinoma to brain [facility])  05/21/2019 Initial Diagnosis   Metastatic adenocarcinoma to brain ([place])   06/23/2019 -  Chemotherapy   The patient had palonosetron (ALOXI) injection 0.25 mg, 0.25 mg, Intravenous,  Once, 0 of 6 cycles PEMEtrexed (ALIMTA) 1,100 mg in sodium chloride 0.9 % 100 mL chemo infusion, 500 mg/m2 = 1,100 mg, Intravenous,  Once, 0 of 6 cycles CARBOplatin (PARAPLATIN) 629.5 mg in sodium chloride 0.9 % 250 mL chemo  infusion, 629.5 mg (100 % of original dose 629.5 mg), Intravenous,  Once, 0 of 6 cycles Dose modification:   (original dose 629.5 mg, Cycle 1) fosaprepitant (EMEND) 150 mg in sodium chloride 0.9 % 145 mL IVPB, 150 mg, Intravenous,  Once, 0 of 6 cycles pembrolizumab (KEYTRUDA) 200 mg in sodium chloride 0.9 % 50 mL chemo infusion, 200 mg, Intravenous, Once, 0 of 6 cycles  for chemotherapy treatment.    Adenocarcinoma of lung ([place])  05/21/2019 Initial Diagnosis   Adenocarcinoma of lung ([place])   06/23/2019 -  Chemotherapy   The patient had palonosetron (ALOXI) injection 0.25 mg, 0.25 mg, Intravenous,  Once, 0 of 6 cycles PEMEtrexed (ALIMTA) 1,100 mg in sodium chloride 0.9 % 100 mL chemo infusion, 500 mg/m2 = 1,100 mg, Intravenous,  Once, 0 of 6 cycles CARBOplatin (PARAPLATIN) 629.5 mg in sodium chloride 0.9 % 250 mL chemo infusion, 629.5 mg (100 % of original dose 629.5 mg), Intravenous,  Once, 0 of 6 cycles Dose modification:   (original dose 629.5 mg, Cycle 1) fosaprepitant (EMEND) 150 mg in sodium chloride 0.9 % 145 mL IVPB, 150 mg, Intravenous,  Once, 0 of 6 cycles pembrolizumab (KEYTRUDA) 200 mg in sodium chloride 0.9 % 50 mL chemo infusion, 200 mg, Intravenous, Once, 0 of 6 cycles  for chemotherapy treatment.       REVIEW OF SYSTEMS:   Constitutional: Denies fevers, chills  Respiratory: Denies cough, dyspnea or wheezes Cardiovascular: Denies palpitation, chest discomfort Gastrointestinal: Reports right upper quadrant pain and nausea.  Denies vomiting, constipation, diarrhea. Skin: Denies abnormal skin rashes Lymphatics: Denies new lymphadenopathy or easy bruising Neurological: Reports headache and weakness in the left lower extremity.  He reports that his legs are overall weak. Behavioral/Psych:  Mood is stable, no new changes  Extremities: No lower extremity edema All other systems were reviewed with the patient and are negative.  I have reviewed the past medical history, past  surgical history, social history and family history with the patient and they are unchanged from previous note.   PHYSICAL EXAMINATION: ECOG PERFORMANCE STATUS: 2 - Symptomatic, <50% confined to bed  Vitals:   06/22/19 0712 06/22/19 1320  BP: (!) 153/92 (!) 158/93  Pulse: 75 80  Resp: 18 19  Temp: 98 F (36.7 C) 98.1 F (36.7 C)  SpO2: 99% 100%   Filed Weights   06/17/19 1821  Weight: 99 kg    Intake/Output from previous day: 05/26 0701 - 05/27 0700 In: 946.3 [P.O.:120; I.V.:826.3] Out: 1025 [Urine:1025]  GENERAL: Alert, appears uncomfortable at times SKIN: skin color, texture, turgor are normal, no rashes or significant lesions EYES: normal, Conjunctiva are pink and non-injected, sclera clear OROPHARYNX: No thrush or mucositis NECK: supple, thyroid normal size, non-tender, without nodularity LYMPH:  no palpable lymphadenopathy in the cervical, axillary or inguinal LUNGS: clear to auscultation and percussion with normal breathing effort HEART: regular rate & rhythm and no murmurs and no lower extremity edema ABDOMEN: Positive bowel sounds, soft, tenderness with palpation over the right upper quadrant Musculoskeletal: Strength 5/5 in the right lower extremity and 4/5 in the left lower extremity NEURO: alert & oriented x 3 with fluent speech, no focal motor/sensory deficits  LABORATORY DATA:  I have reviewed the data as listed CMP Latest Ref Rng & Units 06/22/2019 06/21/2019 06/20/2019  Glucose 70 - 99 mg/dL 163(H) 142(H) 182(H)  BUN 6 - 20 mg/dL 22(H) 21(H) 22(H)  Creatinine 0.61 - 1.24 mg/dL 1.15 1.19 1.20  Sodium 135 - 145 mmol/L 143 142 140  Potassium 3.5 - 5.1 mmol/L 4.3 4.4 4.1  Chloride 98 - 111 mmol/L 106 107 104  CO2 22 - 32 mmol/L 23 26 26   Calcium 8.9 - 10.3 mg/dL 8.1(L) 8.5(L) 8.4(L)  Total Protein 6.5 - 8.1 g/dL 5.6(L) 5.9(L) 5.8(L)  Total Bilirubin 0.3 - 1.2 mg/dL 0.4 0.6 0.5  Alkaline Phos 38 - 126 U/L 319(H) 340(H) 331(H)  AST 15 - 41 U/L 64(H) 78(H)  57(H)  ALT 0 - 44 U/L 217(H) 241(H) 243(H)    Lab Results  Component Value Date   WBC 7.7 06/22/2019   HGB 10.1 (L) 06/22/2019   HCT 32.5 (L) 06/22/2019   MCV 88.1 06/22/2019   PLT 125 (L) 06/22/2019   NEUTROABS 6.9 06/22/2019    DG Chest 2 View  Result Date: 06/17/2019 CLINICAL DATA:  Chest pain, history of prior PE EXAM: CHEST - 2 VIEW COMPARISON:  06/13/2019 FINDINGS: Cardiac shadow is within normal limits. Left lung is clear. Fullness in the right hilum and suprahilar region is noted with volume loss in the upper lobe consistent with the known mass lesion. Right chest wall port is seen in satisfactory position. IMPRESSION: Increasing fullness in the right hilar and suprahilar region with volume loss in the upper lobe slightly progressed when compared with the prior exam. Electronically Signed   By: Inez Catalina M.D.   On: 06/17/2019 14:58   DG Tibia/Fibula Left  Result Date: 06/04/2019 CLINICAL DATA:  Leg pain EXAM: LEFT TIBIA AND FIBULA - 2 VIEW COMPARISON:  None. FINDINGS: No fracture of the tibia or fibula. Knee joint and ankle joint appear normal on two views. The lateral tibial plateau appears irregular however on the comparison femur exam same day the lateral tibial  plateau appears normal. IMPRESSION: No fracture or dislocation. No aggressive osseous lesion identified. Electronically Signed   By: Suzy Bouchard M.D.   On: 06/04/2019 09:00   CT HEAD WO CONTRAST  Result Date: 06/17/2019 CLINICAL DATA:  Known brain metastases. Worsening headache. Patient recently started anticoagulation. EXAM: CT HEAD WITHOUT CONTRAST CT CERVICAL SPINE WITHOUT CONTRAST TECHNIQUE: Multidetector CT imaging of the head and cervical spine was performed following the standard protocol without intravenous contrast. Multiplanar CT image reconstructions of the cervical spine were also generated. COMPARISON:  Jun 04, 2019 CT scan of the brain. Brain MRI May 10, 2019. FINDINGS: CT HEAD FINDINGS Brain: No  subdural, epidural, or subarachnoid hemorrhage. Most of the patient's known brain metastases are not visualized on this unenhanced CT scan. There is suggestion of a few metastases, very subtle. No acute cortical ischemia or infarct. Cerebellum, brainstem, and basal cisterns are unremarkable. No midline shift or mass effect identified. Vascular: No hyperdense vessel or unexpected calcification. Skull: Suspected calvarial metastases seen on MRI are not well appreciated on this study. Sinuses/Orbits: Minimal fluid in the posterior right mastoid air cells without bony erosion. There is opacification of the sphenoid sinuses which is worsened in the interval. 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 CT CERVICAL SPINE FINDINGS Alignment: Normal. Skull base and vertebrae: No fractures identified. Relative lucency in the left lamina and pedicle at C2, extending into the spinous process with periosteal reaction. No other discrete bony lesions in the cervical spine. A lucency in the right mandibular head is well-circumscribed. Soft tissues and spinal canal: No prevertebral fluid or swelling. No visible canal hematoma. Disc levels:  Mild multilevel degenerative disc disease. Upper chest: Only the superior most aspect of the right apical mass is seen on this CT. Several small nodules in the left apex are better assessed on today's CT scan of the chest. Other: No other abnormalities. IMPRESSION: 1. 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 There is suggestion of some of the metastases, very subtle. No midline shift. Basal cisterns are patent. 2. 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 3. Suspected calvarial metastases on the previous MRI are not well seen on this study. 4. No fracture or malalignment in the cervical spine. Lucency in the left C2 lamina  and pedicle extending into the spinous process with periosteal reaction suggest metastatic disease in this region. No other definitive bony metastatic disease in the cervical spine. 5. There is a rounded lucency in the right mandibular head which is nonspecific. 6. Nodularity in the lung apices. See the CT scan of the chest for better evaluation of this region. Electronically Signed   By: Dorise Bullion III M.D   On: 06/17/2019 16:37   CT Head Wo Contrast  Result Date: 06/04/2019 CLINICAL DATA:  Worsening headaches. Additional history provided: Recently diagnosed with metastatic lung cancer. EXAM: CT HEAD WITHOUT CONTRAST TECHNIQUE: Contiguous axial images were obtained from the base of the skull through the vertex without intravenous contrast. COMPARISON:  Brain MRI 05/10/2019, head CT 09/13/2012 FINDINGS: Brain: Numerous small enhancing parenchymal lesions were demonstrated on prior MRI 05/10/2019. Some of these are appreciated on today's examination but many of these are occult by CT. Several of these lesions were hemorrhagic on the prior MRI. There is a 3 mm focus of hyperdensity within the left occipital lobe consistent with redemonstrated hemorrhage  associated with a lesion at this site (series 2, image 17). No definitively new lesion is identified. No demarcated cortical infarct. No extra-axial fluid collection. No evidence of intracranial mass. No midline shift. Vascular: No hyperdense vessel. Skull: No calvarial fracture. Suspected metastases within the skull base and upper cervical spine were better appreciated on prior MRI. Sinuses/Orbits: No acute orbital abnormality by CT. Please note subtle enhancement along the posterior left globe with suspected on prior MRI. Ethmoid and sphenoid sinus mucosal thickening. Right sphenoid sinus air-fluid level. No significant mastoid effusion IMPRESSION: Numerous small parenchymal lesions (likely metastases) were demonstrated on prior MRI 05/10/2019. Some of these  lesions are appreciated on today's examination, but many of these are occult by CT. There is a 3 mm focus of hyperdensity consistent with hemorrhage at site of a known left occipital lobe lesion. However, this is unchanged and hemorrhage was present at this site on prior MRI. No definitively new lesion or interval acute intracranial abnormality is identified. Suspected metastases within the skull base and upper cervical spine were better appreciated on prior MRI. Paranasal sinus mucosal thickening with right sphenoid sinus air-fluid level. Correlate for acute sinusitis. Electronically Signed   By: Kellie Simmering DO   On: 06/04/2019 08:44   CT ANGIO CHEST PE W OR WO CONTRAST  Result Date: 06/17/2019 CLINICAL DATA:  Recently diagnosed metastatic lung cancer, worsening shortness of breath, chest pain, dyspnea EXAM: CT ANGIOGRAPHY CHEST WITH CONTRAST TECHNIQUE: Multidetector CT imaging of the chest was performed using the standard protocol during bolus administration of intravenous contrast. Multiplanar CT image reconstructions and MIPs were obtained to evaluate the vascular anatomy. CONTRAST:  138mL OMNIPAQUE IOHEXOL 350 MG/ML SOLN COMPARISON:  06/13/2019 FINDINGS: Cardiovascular: This is a technically adequate evaluation of the pulmonary vasculature. The chronic right lower lobe pulmonary emboli seen previously are again noted and unchanged. There are no acute pulmonary emboli. The right hilar mass results in extrinsic compression of the right upper lobe pulmonary artery, unchanged since prior exam. No pericardial effusion.  The thoracic aorta remains unremarkable. Mediastinum/Nodes: Mediastinal and right hilar adenopathy unchanged since prior exam. Index lymph node in the right paratracheal region image 22 measures 11 mm in short axis, stable since previous study. Right hilar mass measures 2.5 cm reference image 37, previously measuring approximately 2.2 cm. No new adenopathy. The thyroid, trachea, and esophagus  are grossly normal. Lungs/Pleura: Thick-walled cavitary right upper lobe mass is again noted, measuring approximately 2.8 x 3.6 cm reference image 23, previously measuring 3.4 x 2.2 cm by my measurement. Findings are consistent with primary malignancy. Progressive wedge-shaped consolidation within the right upper lobe likely reflects pulmonary infarct given the extrinsic compression on the right upper lobe pulmonary artery by the infiltrative right hilar mass. Subcentimeter nodule left upper lobe image 28 and superior segment left lower lobe image 39 unchanged. There are trace bilateral pleural effusions right greater than left. No pneumothorax. There is narrowing of the right upper lobe bronchus and segmental airways due to the infiltrative right hilar mass described above, similar to previous study. Upper Abdomen: Numerous liver metastases are noted, please refer to CT abdomen report. Musculoskeletal: Mottled appearance of the thoracic spine consistent with bony metastases, stable since prior study. Pathologic T3 fracture is seen with mild retropulsion resulting in at least 50% narrowing of the central canal. There is likely an associated soft tissue component. No other pathologic fractures. Review of the MIP images confirms the above findings. IMPRESSION: 1. 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 No  acute pulmonary embolus. 2. Cavitating right upper lobe mass consistent with lung cancer. Minimal increase in size. 3. Marked right hilar and mediastinal adenopathy as above, without significant change. There is extrinsic compression upon the right upper lobe pulmonary artery and narrowing of the right upper lobe bronchus due to the right hilar mass, stable. 4. Progressive right upper lobe pulmonary infarct due to the extrinsic compression on the right upper lobe pulmonary artery. 5. Diffuse bony metastases. Pathologic T3 fracture with resulting central canal narrowing as above. Please refer to CT  thoracic spine report. 6. Trace bilateral pleural effusions. 7. Liver metastases.  Please refer to abdominal CT report. Electronically Signed   By: Randa Ngo M.D.   On: 06/17/2019 16:30   CT Angio Chest PE W and/or Wo Contrast  Result Date: 06/13/2019 CLINICAL DATA:  Shortness of breath. Chest pain. Metastatic lung carcinoma. Active chemotherapy and radiation. EXAM: CT ANGIOGRAPHY CHEST WITH CONTRAST TECHNIQUE: Multidetector CT imaging of the chest was performed using the standard protocol during bolus administration of intravenous contrast. Multiplanar CT image reconstructions and MIPs were obtained to evaluate the vascular anatomy. CONTRAST:  161mL OMNIPAQUE IOHEXOL 350 MG/ML SOLN COMPARISON:  Chest radiograph earlier this day. Most recent chest CT 05/10/2019 FINDINGS: Cardiovascular: Right hilar adenopathy/soft tissue density causes severe narrowing of the right upper lobe pulmonary arteries which are attenuated. This causes mass effect on the distal main right pulmonary artery. Circumferential soft tissue density involving the right lower lobar pulmonary artery. Filling defects within the segmental branches of the right lower lobe pulmonary arteries are eccentric and may be due to direct invasion or de novo pulmonary embolus, for example series 6, image 145 and 149. No definite pulmonary emboli on the left, however breathing motion artifact and contrast bolus timing limits detailed assessment. Thoracic aorta is normal in caliber without dissection. Right chest port tip in the lower SVC. Heart is normal in size. No pericardial effusion. Mediastinum/Nodes: Progressive right hilar adenopathy/soft tissue density causes severe narrowing of the right upper lobe pulmonary arteries. This is increased from prior exam. Representative right hilar lymph node measures 22 mm, series 5, image 59, previously 19 mm. This is contiguous with right paratracheal soft tissue densities/adenopathy which is difficult to  delineate. Right lower paratracheal node has increased currently 13 mm, series 5, image 52, previously 10 mm. High mediastinal node measures 11 mm, series 5, image 37, unchanged. No left hilar adenopathy. Esophagus slightly patulous without wall thickening. No visualized thyroid nodule. Lungs/Pleura: Spiculated right upper lobe cavitary nodule currently measuring approximately 3 x 2.6 cm with decreased peripheral soft tissue component from prior exam. This is contiguous with soft tissue density that extends to the right hilum. Severe narrowing of the right upper lobe bronchus. Ill-defined paramediastinal density in the right upper lobe is likely post treatment related change. Peripheral subpleural opacity in the right upper lobe abutting, series 7, image 57 is nonspecific, and may represent pulmonary infarct, post treatment related change, or pneumonia. Small left apical nodule series 7, image 39 and 40 are unchanged. Small nodule in the superior segment of the left lower lobe is unchanged, series 7, image 55. 3 mm perifissural nodule in the right middle lobe, series 7, image 64, unchanged. No definite new nodule. No pleural fluid. Upper Abdomen: Known metastatic disease in the liver, not as well visualized on the current exam given phase of contrast. No acute abdominal findings. 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 T3 lesion causing mild pathologic compression fracture, similar to  prior. No new spinal compression fractures. Review of the MIP images confirms the above findings. IMPRESSION: 1. Progressive right hilar adenopathy/soft tissue density which causes severe narrowing of the right upper lobe pulmonary arteries which are attenuated. Small segmental pulmonary emboli in the right lower lobe are adjacent to adenopathy, may represent direct invasion versus did no novo clot. Thromboembolic burden is small. No pulmonary embolus on the left, however breathing motion artifact and contrast  bolus timing limits detailed assessment. 2. Spiculated right upper lobe cavitary nodule with decreased peripheral soft tissue component from prior exam and slight decrease in size. 3. Progressive right hilar adenopathy/soft tissue density causes severe narrowing of the right upper lobe bronchus. There may be an element of post treatment related change, however some of the mediastinal nodes have increased in size suspicious for progression in disease. 4. Additional small pulmonary nodules both lungs are unchanged from exam last month. 5. Osseous metastatic disease is grossly unchanged. Known hepatic metastatic disease is not well delineated given phase of contrast These results were called by telephone at the time of interpretation on 06/13/2019 at 1:52 pm to [place] , who verbally acknowledged these results. Electronically Signed   By: Keith Rake M.D.   On: 06/13/2019 13:53   CT CERVICAL SPINE WO CONTRAST  Result Date: 06/17/2019 CLINICAL DATA:  Known brain metastases. Worsening headache. Patient recently started anticoagulation. EXAM: CT HEAD WITHOUT CONTRAST CT CERVICAL SPINE WITHOUT CONTRAST TECHNIQUE: Multidetector CT imaging of the head and cervical spine was performed following the standard protocol without intravenous contrast. Multiplanar CT image reconstructions of the cervical spine were also generated. COMPARISON:  Jun 04, 2019 CT scan of the brain. Brain MRI May 10, 2019. FINDINGS: CT HEAD FINDINGS Brain: No subdural, epidural, or subarachnoid hemorrhage. Most of the patient's known brain metastases are not visualized on this unenhanced CT scan. There is suggestion of a few metastases, very subtle. No acute cortical ischemia or infarct. Cerebellum, brainstem, and basal cisterns are unremarkable. No midline shift or mass effect identified. Vascular: No hyperdense vessel or unexpected calcification. Skull: Suspected calvarial metastases seen on MRI are not well appreciated on this  study. Sinuses/Orbits: Minimal fluid in the posterior right mastoid air cells without bony erosion. There is opacification of the sphenoid sinuses which is worsened in the interval. 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 CT CERVICAL SPINE FINDINGS Alignment: Normal. Skull base and vertebrae: No fractures identified. Relative lucency in the left lamina and pedicle at C2, extending into the spinous process with periosteal reaction. No other discrete bony lesions in the cervical spine. A lucency in the right mandibular head is well-circumscribed. Soft tissues and spinal canal: No prevertebral fluid or swelling. No visible canal hematoma. Disc levels:  Mild multilevel degenerative disc disease. Upper chest: Only the superior most aspect of the right apical mass is seen on this CT. Several small nodules in the left apex are better assessed on today's CT scan of the chest. Other: No other abnormalities. IMPRESSION: 1. 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 There is suggestion of some of the metastases, very subtle. No midline shift. Basal cisterns are patent. 2. 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 3. Suspected calvarial metastases on the previous MRI are not well seen on this study. 4. No fracture or malalignment in the cervical spine. Lucency in the  left C2 lamina and pedicle extending into the spinous process with periosteal reaction suggest metastatic disease in this region. No other definitive bony metastatic disease in the cervical spine. 5. There is a rounded lucency in the right mandibular head which is nonspecific. 6. Nodularity in the lung apices. See the CT scan of the chest for better evaluation of this region. Electronically Signed   By: Dorise Bullion III M.D   On: 06/17/2019 16:37   MR Cervical Spine Wo  Contrast  Result Date: 06/17/2019 CLINICAL DATA:  34 year old male with widely metastatic lung cancer. Back pain. EXAM: MRI CERVICAL SPINE WITHOUT CONTRAST TECHNIQUE: Multiplanar, multisequence MR imaging of the cervical spine was performed. No intravenous contrast was administered. COMPARISON:  CT Thoracic Spine, Lumbar Spine, CT Chest, Abdomen, and Pelvis yesterday. Brain MRI 05/10/2019. FINDINGS: Alignment: Overall straightening of cervical lordosis. Vertebrae: Metastatic disease to bone at virtually all cervical spine levels, and throughout the visible skull base. Only the C1 vertebra seems to be spared at this time. Anterior and posterior element metastases in the cervical spine. No pathologic cervical vertebral fracture, although there is epidural and extraosseous extension of tumor from the left C2 posterior elements (series 13, image 4) with severe involvement of the left C3 neural foramen. No significant cervical spinal stenosis. See thoracic spine findings reported separately. Cord: Spinal cord signal is within normal limits at all visualized levels. No IV contrast administered. Posterior Fossa, vertebral arteries, paraspinal tissues: Cervicomedullary junction is within normal limits. Abnormal T2 and STIR hyperintensity in the visible right cerebellar hemisphere compatible with known brain metastases. No posterior fossa mass effect is evident. Preserved major vascular flow voids in the neck. There is inflammation of the C2-C3 level posterior paraspinal muscles greater on the left which is probably related to the C2 extraosseous tumor. Disc levels: Incidental degenerative changes including: C3-C4: Small leftward disc herniation with mild spinal stenosis and mild spinal cord mass effect. C4-C5: Disc bulge and small rightward disc herniation with mild spinal stenosis and mild right hemi cord mass effect. Associated moderate to severe right C5 foraminal stenosis. IMPRESSION: 1. Widespread osseous metastatic  disease throughout the cervical spine and the visible skull base. 2. Epidural and extraosseous extension of tumor from the left C2 posterior elements with severe involvement of the left C3 neural foramen, but no malignant spinal stenosis. There is mild degenerative cervical spinal stenosis at C3-C4 and C4-C5. 3. No pathologic cervical vertebral fracture. 4. See Thoracic Spine findings reported separately. Electronically Signed   By: Genevie Ann M.D.   On: 06/17/2019 21:59   MR THORACIC SPINE WO CONTRAST  Result Date: 06/17/2019 CLINICAL DATA:  34 year old male with widely metastatic lung cancer. Back pain. EXAM: MRI THORACIC SPINE WITHOUT CONTRAST TECHNIQUE: Multiplanar, multisequence MR imaging of the thoracic spine was performed. No intravenous contrast was administered. COMPARISON:  Cervical MRI today reported separately. Thoracic spine CT, CT Chest, Abdomen, and Pelvis yesterday. FINDINGS: Limited cervical spine imaging:  Reported separately today. Thoracic spine segmentation:  Normal. Alignment:  Straightening of thoracic kyphosis. Vertebrae: Thoracic vertebral metastases at every level. Subtotal T1 vertebral replacement by tumor. Early extraosseous extension from the spinous process into the paraspinal muscles. T3 vertebral body pathologic compression fracture with 50% central loss of vertebral body height, and some retropulsed bone/tumor. Additionally, there is evidence of some epidural tumor extension at that level (series 34, image 15) resulting in mild spinal stenosis, borderline to mild cord compression. Moderate left T3 neural foraminal stenosis. Epidural tumor also at the T5 level  where the vertebra is completely replaced by tumor. Abundant extraosseous extension of tumor also from the posterior elements into the posterior paraspinal musculature. Mild spinal stenosis and borderline to mild cord compression. T6 near complete vertebral replacement by tumor with ventral and foraminal epidural tumor  extension. When superimposed on epidural lipomatosis there is mild spinal stenosis and cord compression (series 34, image 28). Moderate to severe right T6 foraminal involvement. Ventral epidural tumor extension at T7 without cord compression at this time (series 34, image 32). Ventral epidural tumor extension at T9 with mild spinal stenosis and borderline to mild cord compression at this time (image 42). T10 right side posterior element extraosseous extension of tumor into the paraspinal muscles. Cord: No thoracic cord signal abnormality despite the multiple levels of epidural tumor. Paraspinal and other soft tissues: Abnormal right upper lung and mediastinum. Small layering right pleural effusion. Partially visible liver metastases. Disc levels: There are some superimposed thoracic degenerative changes, including: -Mild degenerative thoracic spinal stenosis at T8-T9 related to left eccentric disc and endplate degeneration, mild to moderate posterior element degeneration. -borderline to mild degenerative spinal stenosis at T11-T12 related to right paracentral disc protrusion and mild facet hypertrophy. IMPRESSION: 1. Bone metastases involving every thoracic spine level. Pathologic fracture of T3 with up to 50% central vertebral loss of height, mild retropulsion of bone in tumor. 2. Ventral epidural tumor with up to mild spinal stenosis and borderline to mild cord compression at: T3, T5, T6, T7, T9. 3. Moderate or severe neural foraminal involvement by tumor at the left T3, right T6 nerve levels. 4. Superimposed mild degenerative thoracic spinal stenosis at T8-T9 and T11-T12. Electronically Signed   By: Genevie Ann M.D.   On: 06/17/2019 22:09   MR LUMBAR SPINE WO CONTRAST  Result Date: 06/17/2019 CLINICAL DATA:  34 year old male with widely metastatic lung cancer. Back pain. EXAM: MRI LUMBAR SPINE WITHOUT CONTRAST TECHNIQUE: Multiplanar, multisequence MR imaging of the lumbar spine was performed. No intravenous  contrast was administered. COMPARISON:  Thoracic spine MRI today. Lumbar CT, CT Chest, Abdomen, and Pelvis yesterday. Lumbar MRI 05/10/2019. FINDINGS: Segmentation: Normal, concordant with the thoracic spine numbering today. Alignment:  Stable lumbar lordosis since April. Vertebrae: Lumbar metastatic disease at all levels. Compared to 05/10/2019 lumbar vertebral tumor shows mild generalized progression. Confluent sacral and medial iliac bone metastatic disease redemonstrated. Mild pathologic fracture of the L2 superior endplate. This has only mildly progressed since April (series 38, image 9). No other lumbar pathologic fracture. And although there is some early lumbar extraosseous tumor extension (right L4 transverse process series 39, image 25), there is no significant lumbar epidural or neural foraminal tumor at this time. However, there is obliteration of the right S1, left S2 and S3 neural foramina by tumor. Conus medullaris and cauda equina: Conus extends to the T12-L1 level. No lower spinal cord or conus signal abnormality. Cauda equina nerve roots appear normal in the absence of contrast. Paraspinal and other soft tissues: Stable visible abdominal viscera. Disc levels: Lumbar epidural lipomatosis. Superimposed lumbar degenerative changes most notable for: -L3-L4 left paracentral disc protrusion superimposed on disc bulge, posterior element hypertrophy and epidural lipomatosis. Moderate left lateral recess stenosis (left L4 nerve level) and mild spinal stenosis. IMPRESSION: 1. Lumbar metastatic disease at all levels with mild generalized progression since the April MRI. Mild pathologic fracture of the L2 superior endplate. 2. Occasional early extraosseous extension of tumor (right L4 transverse process), but no lumbar epidural or neural foraminal tumor at this time. 3. Bulky sacral  tumor re-demonstrated including severe involvement of the right S1, left S2, and left S3 neural foramina. Electronically Signed    By: Genevie Ann M.D.   On: 06/17/2019 22:16   US Abdomen Complete  Result Date: 06/21/2019 CLINICAL DATA:  Postprandial abdominal pain. Metastatic lung cancer. EXAM: ABDOMEN ULTRASOUND COMPLETE COMPARISON:  CT abdomen pelvis 05/10/2019 FINDINGS: Gallbladder: No gallstones or wall thickening visualized. No sonographic Murphy sign noted by sonographer. Common bile duct: Diameter: 2.7 mm Liver: Liver parenchyma demonstrates increased echogenicity diffusely. Multiple liver masses are present compatible metastatic disease as noted on prior CT. Largest lesions in the right lobe measure 3.7 x 2.6 x 1.7 cm, and 3.8 x 3.5 x 3.5 cm. Multiple smaller lesions also present in the liver. Portal vein is patent on color Doppler imaging with normal direction of blood flow towards the liver. IVC: No abnormality visualized. Pancreas: Visualized portion unremarkable. Spleen: Size and appearance within normal limits. Right Kidney: Length: 9.7 cm. Echogenicity within normal limits. No mass or hydronephrosis visualized. Left Kidney: Length: 10.4 cm. Echogenicity within normal limits. No mass or hydronephrosis visualized. Abdominal aorta: No aneurysm visualized. Other findings: Small right pleural effusion IMPRESSION: Negative for gallstones Multiple hepatic metastasis as noted on CT. Fatty infiltration liver is noted. Small right pleural effusion. Electronically Signed   By: Franchot Gallo M.D.   On: 06/21/2019 09:08   CT ABDOMEN PELVIS W CONTRAST  Result Date: 06/17/2019 CLINICAL DATA:  Metastatic lung cancer, chest pain, lower and mid back pain EXAM: CT ABDOMEN AND PELVIS WITH CONTRAST TECHNIQUE: Multidetector CT imaging of the abdomen and pelvis was performed using the standard protocol following bolus administration of intravenous contrast. CONTRAST:  167mL OMNIPAQUE IOHEXOL 350 MG/ML SOLN COMPARISON:  05/10/2019 FINDINGS: Lower chest: Please refer to corresponding chest CT report describing right upper lobe malignancy, right  hilar and mediastinal adenopathy, and chronic right lower lobe pulmonary emboli. Hepatobiliary: Innumerable hepatic metastases are again noted, increased in size. Largest index lesion within the caudate measures 4.9 cm image 18, previously measuring 2.8 cm image 47. No biliary dilation.  Gallbladder is unremarkable. Pancreas: Unremarkable. No pancreatic ductal dilatation or surrounding inflammatory changes. Spleen: Normal in size without focal abnormality. Adrenals/Urinary Tract: Adrenal glands are unremarkable. Kidneys are normal, without renal calculi, focal lesion, or hydronephrosis. Bladder is unremarkable. Stomach/Bowel: No bowel obstruction or ileus. No bowel wall thickening or inflammatory changes. Vascular/Lymphatic: No pathologic adenopathy within the abdomen or pelvis. Vascular structures are unremarkable. Reproductive: Prostate is unremarkable. Other: No free fluid or free gas.  No abdominal wall hernia. Musculoskeletal: Progressive diffuse bony metastases are seen throughout the bilateral hips, lumbar spine, sacrum, and bony pelvis. Please refer to dedicated CT lumbar spine describing findings related to the lumbar spine and sacrum. Increased size of the expansile left iliac crest lesion with associated soft tissue component. There are no pathologic fractures. IMPRESSION: 1. Progressive liver metastases as above. 2. Progressive bony metastases involving the lumbar spine, bony pelvis, and sacrum. Please refer to corresponding CT lumbar spine report for important findings in that region. Electronically Signed   By: Randa Ngo M.D.   On: 06/17/2019 16:46   CT T-SPINE NO CHARGE  Result Date: 06/17/2019 CLINICAL DATA:  Metastatic lung cancer, back pain EXAM: CT THORACIC SPINE WITHOUT CONTRAST TECHNIQUE: Multidetector CT images of the thoracic were obtained using the standard protocol without intravenous contrast. COMPARISON:  06/13/2019 FINDINGS: Alignment: Alignment is anatomic. Vertebrae: Diffuse  metastatic disease is seen throughout the entirety of the thoracic spine. There is moderate  appearance and destruction of the T3 vertebral body with pathologic fracture centrally with 50% loss of vertebral body height. Paraspinal and other soft tissues: Please refer to corresponding chest CT report describing a cavitating right upper lobe mass, right hilar mass, and diffuse mediastinal adenopathy. Trace bilateral pleural effusions are noted. There are chronic right lower lobe segmental pulmonary emboli. Disc levels: As result of the T3 pathologic fracture and metastatic disease described above, there is approximately 50% narrowing of the central canal at T3. At T8 and T9, there are expansile metastatic lesions resulting in bowing of the posterior margin of the vertebral bodies with estimated 50-75% narrowing of the central canal and likely cord compression. IMPRESSION: 1. Diffuse bony metastases throughout the entirety of the thoracic spine, most pronounced at T3, T8, and T9 with resulting central canal narrowing and likely cord compression. 2. Pathologic fracture of the T3 vertebral body, with 50% loss of height centrally. 3. Please refer to CT chest report describing cavitating right upper lobe mass, right hilar mass, and mediastinal adenopathy. Electronically Signed   By: Randa Ngo M.D.   On: 06/17/2019 16:40   CT L-SPINE NO CHARGE  Result Date: 06/17/2019 CLINICAL DATA:  Low back pain, metastatic lung cancer EXAM: CT LUMBAR SPINE WITHOUT CONTRAST TECHNIQUE: Multidetector CT imaging of the lumbar spine was performed without intravenous contrast administration. Multiplanar CT image reconstructions were also generated. COMPARISON:  06/13/2019 FINDINGS: Segmentation: There are 5 non-rib-bearing lumbar type vertebral bodies. Alignment: Alignment is anatomic. Vertebrae: There is diffuse metastatic disease, with multiple lytic lesions throughout the lumbar spine. These are most pronounced within the left  aspect of the L1 vertebral body extending into the left pedicle, anterior aspect of the L2 vertebral body, right L3 transverse process and central aspect L3 vertebral body, right L4 pedicle and transverse process. Additional lytic lesions are seen within the bilateral iliac bones and sacrum. Bony destruction and soft tissue mass obliterate the left S2/S3 and S3/S4 neural foramen. I do not see any pathologic fracture within the lumbar spine. Paraspinal and other soft tissues: Soft tissue mass results in obliteration of the left S2/S3 and S3/S4 neural foramen. Likely soft tissue component arising from the L1 metastatic lesion with slight enlargement of the left psoas muscle. Disc levels: There is multilevel lumbar spondylosis. Mild central canal stenosis is seen at L1/L2, L2/L3, and L3/L4. At L3/L4 there is left predominant lateral recess and neural foraminal encroachment as well. IMPRESSION: 1. Diffuse bony metastatic disease throughout the lumbar spine, sacrum, and bilateral iliac bones as above. 2. There are no pathologic fractures. Soft tissue mass and bony destruction obliterate the left S2/S3 and S3/S4 neural foramen. 3. Multilevel lumbar spondylosis most pronounced at L3/L4 with left predominant neural foraminal and lateral recess encroachment. Electronically Signed   By: Randa Ngo M.D.   On: 06/17/2019 16:35   DG Chest Portable 1 View  Result Date: 06/13/2019 CLINICAL DATA:  Shortness of breath and chest pain EXAM: PORTABLE CHEST 1 VIEW COMPARISON:  Jun 02, 2019 FINDINGS: Port-A-Cath tip is in the superior vena cava. No pneumothorax. Lungs are clear. Heart size and pulmonary vascularity are normal. No adenopathy. No bone lesions. IMPRESSION: Port-A-Cath tip in superior vena cava. No pneumothorax. Lungs clear. Cardiac silhouette within normal limits. Electronically Signed   By: Lowella Grip III M.D.   On: 06/13/2019 10:54   DG Chest Portable 1 View  Result Date: 06/02/2019 CLINICAL DATA:  Hervey Ard  right-sided chest pain EXAM: PORTABLE CHEST 1 VIEW COMPARISON:  May 06, 2019 FINDINGS: The previously demonstrated right upper lobe pulmonary nodule is better visualized on the patient's prior CT chest and prior x-rays. There is no pneumothorax. No significant pleural effusion. No evidence for an acute osseous abnormality. The heart size is normal. There is some mild thickening of the right paratracheal stripe which may be secondary to underlying mediastinal adenopathy. IMPRESSION: 1. Mild thickening of the right paratracheal stripe which may be secondary to underlying mediastinal adenopathy. 2. The previously demonstrated right upper lobe pulmonary nodule is better visualized on the patient's prior CT chest and prior x-rays. 3. No acute cardiopulmonary process. Electronically Signed   By: Constance Holster M.D.   On: 06/02/2019 17:55   DG Femur Min 2 Views Left  Result Date: 06/04/2019 CLINICAL DATA:  Lower extremity pain. EXAM: LEFT FEMUR 2 VIEWS COMPARISON:  None. FINDINGS: Frontal and lateral views were obtained. No fracture or dislocation. No abnormal periosteal reaction. No blastic or lytic bone lesions. There is moderate degenerative change in the knee joint. No appreciable knee joint effusion. IMPRESSION: No blastic or lytic bone lesions. No abnormal periosteal reaction. No fracture or dislocation. 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 Electronically Signed   By: Lowella Grip III M.D.   On: 06/04/2019 08:55   IR IMAGING GUIDED PORT INSERTION  Result Date: 06/12/2019 INDICATION: 34 year old male with a history of metastatic right upper lobe lung cancer. He presents for port catheter placement. EXAM: IMPLANTED PORT A CATH PLACEMENT WITH ULTRASOUND AND FLUOROSCOPIC GUIDANCE MEDICATIONS: Ancef 2 g; The antibiotic was administered within an appropriate time interval prior to skin puncture. ANESTHESIA/SEDATION: Versed 4 mg IV; Fentanyl 100 mcg IV; Moderate Sedation Time:  19 minutes The  patient was continuously monitored during the procedure by the interventional radiology nurse under my direct supervision. FLUOROSCOPY TIME:  0 minutes, 30 seconds (7 mGy) COMPLICATIONS: None immediate. PROCEDURE: The right neck and chest was prepped with chlorhexidine, and draped in the usual sterile fashion using maximum barrier technique (cap and mask, sterile gown, sterile gloves, large sterile sheet, hand hygiene and cutaneous antiseptic). Local anesthesia was attained by infiltration with 1% lidocaine with epinephrine. Ultrasound demonstrated patency of the right internal jugular vein, and this was documented with an image. Under real-time ultrasound guidance, this vein was accessed with a 21 gauge micropuncture needle and image documentation was performed. A small dermatotomy was made at the access site with an 11 scalpel. A 0.018" wire was advanced into the SVC and the access needle exchanged for a 58F micropuncture vascular sheath. The 0.018" wire was then removed and a 0.035" wire advanced into the IVC. An appropriate location for the subcutaneous reservoir was selected below the clavicle and an incision was made through the skin and underlying soft tissues. The subcutaneous tissues were then dissected using a combination of blunt and sharp surgical technique and a pocket was formed. A single lumen power injectable portacatheter was then tunneled through the subcutaneous tissues from the pocket to the dermatotomy and the port reservoir placed within the subcutaneous pocket. The venous access site was then serially dilated and a peel away vascular sheath placed over the wire. The wire was removed and the port catheter advanced into position under fluoroscopic guidance. The catheter tip is positioned in the superior cavoatrial junction. This was documented with a spot image. The portacatheter was then tested and found to flush and aspirate well. The port was flushed with saline followed by 100 units/mL  heparinized saline. The pocket was then closed in two layers  using first subdermal inverted interrupted absorbable sutures followed by a running subcuticular suture. The epidermis was then sealed with Dermabond. The dermatotomy at the venous access site was also closed with Dermabond. IMPRESSION: Successful placement of a right IJ approach Power Port with ultrasound and fluoroscopic guidance. The catheter is ready for use. Electronically Signed   By: Jacqulynn Cadet M.D.   On: 06/12/2019 16:26   VAS [place] LOWER EXTREMITY VENOUS (DVT) (ONLY [facility] & [facility])  Result Date: 06/04/2019  Lower Venous DVTStudy Indications: Swelling.  Risk Factors: None identified. Limitations: Poor ultrasound/tissue interface. Comparison Study: No prior studies. Performing Technologist: Oliver Hum RVT  Examination Guidelines: A complete evaluation includes B-mode imaging, spectral Doppler, color Doppler, and power Doppler as needed of all accessible portions of each vessel. Bilateral testing is considered an integral part of a complete examination. Limited examinations for reoccurring indications may be performed as noted. The reflux portion of the exam is performed with the patient in reverse Trendelenburg.  +-----+---------------+---------+-----------+----------+--------------+ RIGHTCompressibilityPhasicitySpontaneityPropertiesThrombus Aging +-----+---------------+---------+-----------+----------+--------------+ CFV  Full           Yes      Yes                                 +-----+---------------+---------+-----------+----------+--------------+   +---------+---------------+---------+-----------+----------+--------------+ LEFT     CompressibilityPhasicitySpontaneityPropertiesThrombus Aging +---------+---------------+---------+-----------+----------+--------------+ CFV      Full           Yes      Yes                                 +---------+---------------+---------+-----------+----------+--------------+  SFJ      Full                                                        +---------+---------------+---------+-----------+----------+--------------+ FV Prox  Full                                                        +---------+---------------+---------+-----------+----------+--------------+ FV Mid   Full                                                        +---------+---------------+---------+-----------+----------+--------------+ FV DistalFull                                                        +---------+---------------+---------+-----------+----------+--------------+ PFV      Full                                                        +---------+---------------+---------+-----------+----------+--------------+  POP      None           No       No                   Acute          +---------+---------------+---------+-----------+----------+--------------+ PTV      None                                         Acute          +---------+---------------+---------+-----------+----------+--------------+ PERO     None                                         Acute          +---------+---------------+---------+-----------+----------+--------------+ Gastroc  None                                         Acute          +---------+---------------+---------+-----------+----------+--------------+     Summary: RIGHT: - No evidence of common femoral vein obstruction.  LEFT: - Findings consistent with acute deep vein thrombosis involving the left popliteal vein, left posterior tibial veins, left peroneal veins, and left gastrocnemius veins. - No cystic structure found in the popliteal fossa.  *See table(s) above for measurements and observations. Electronically signed by Deitra Mayo MD on 06/04/2019 at 12:00:49 PM.    Final     ASSESSMENT AND PLAN: William Ali 34 y.o. male with medical history significant for metastatic adenocarcinoma of the lung who  presents for re-admission with intractable pain.  Unfortunately this marked the second admission for William Ali due to intractable pain.  We appreciate the assistance of palliative care in attempting to better control his pain.    We would like to get systemic chemotherapy started as soon as possible.  He was initially scheduled for outpatient treatment at the cancer center on 06/23/2019. We have been holding on starting systemic therapy so the patient can receive radiation treatment to the brain and to the spine in order to help adequately controlled the strongest problem areas of his disease. Despite his widely metastatic disease this young man is still quite robust and I believe would be able to tolerate full strength chemotherapy.  We will be closely monitoring his kidney and liver function to assure that he is able to tolerate the therapy as planned. Fortunately his planned chemotherapy regimen does not need to be altered with worsening liver function.  I have sent a message to the patient's hospitalist to determine if we can get him transferred to the 6th floor for chemotherapy administration.  We would like to administer this on 06/23/2019.  #Metastatic Adenocarcinoma of the Lung #Intractable Pain of Head/Spine #RLL PE and LLE DVT --appreciate the assistance of the multiple disciplines involved, including hospitalist, radiation oncology and palliative care --Agree with current pain medication regimen --systemic therapy with Carboplatin/Pembrolizumab/Pemetrexed currently planned for Friday 06/23/2019. We intend to keep this start date.  --Oncology will continue to follow.     LOS: 5 days   Mikey Bussing, DNP, AGPCNP-BC, AOCNP 06/22/19

## 2019-06-22 NOTE — Progress Notes (Signed)
PT Cancellation Note  Patient Details Name: [name] MRN: [id number] DOB: Jan 25, 1986   Cancelled Treatment:    Reason Eval/Treat Not Completed: Fatigue/lethargy limiting ability to participate(pt stated he's too tired to do PT at present. Will follow.)   [name] Doheny PT 06/22/2019  Acute Rehabilitation Services Pager [phone] Office [phone]

## 2019-06-22 NOTE — TOC Progression Note (Signed)
Transition of Care [facility]) - Progression Note    Patient Details  Name: William Ali MRN: [id number] Date of Birth: 07-16-1985  Transition of Care [facility]) CM/SW Contact  Purcell Mouton, RN Phone Number: 06/22/2019, 3:53 PM  Clinical Narrative:    Palliative managing pain meds. Not ready fir discharge.   Expected Discharge Plan: Home/Self Care Barriers to Discharge: Continued Medical Work up  Expected Discharge Plan and Services Expected Discharge Plan: Home/Self Care   Discharge Planning Services: CM Consult   Living arrangements for the past 2 months: Apartment                 DME Arranged: Walker rolling, 3-N-1 DME Agency: [employer] Date DME Agency Contacted: 06/19/19 Time DME Agency Contacted: [id number] Representative spoke with at DME Agency: [place] Interventions    Readmission Risk Interventions Readmission Risk Prevention Plan 06/19/2019 06/14/2019  Transportation Screening Complete Complete  Medication Review Press photographer) Complete Complete  PCP or Specialist appointment within 3-5 days of discharge Complete Complete  HRI or [place] Complete Complete  SW Recovery Care/Counseling Consult Complete Complete  Palliative Care Screening Not Applicable Not [place] Not Applicable Not Applicable  Some recent data might be hidden

## 2019-06-22 NOTE — Progress Notes (Signed)
Called by RN due to patient complaints of re: worsening pain in his back. [place] reviewed suspect at least some of his pain is related to casular pain from liver mets but he has many areas of concern related to wide spread spinal mets. Has not gotten relief from increased duragesic dose and has actually required more PRN hydromorphone. Increased hydromorphone dose to 2mg  q2 hours and ordered a dose of toradol X1. If he improves with toradol will add on daily COX-2 NSAID.Will follow-up tomorrow to see how he has responded to changes made today- if his pain continues to escalate will need to explore the possibility of worsening disease- he is at high risk for serious complication related to his spinal mets and vertebral instability. Recommend dose of Zometa as well to reduce further SREs.  [name], DO Palliative Medicine

## 2019-06-22 NOTE — Progress Notes (Signed)
OT Cancellation Note  Patient Details Name: William Ali MRN: [id number] DOB: 1985-10-22   Cancelled Treatment:    Reason Eval/Treat Not Completed: Patient declined, no reason specified. First attempt patient on phone with chemotherapy, second attempt patient waiting for lunch and reports feeling tired "I've been up a lot today." Will re-attempt 5/28 as schedule permits.  Delbert Phenix OT Pager: [place] 06/22/2019, 1:52 PM

## 2019-06-22 NOTE — Progress Notes (Signed)
PROGRESS NOTE  JIONNI HELMING EHM:[id number] DOB: Jan 03, 1986 DOA: 06/17/2019 PCP: Patient, No Pcp Per  HPI/Recap of past 24 hours: Turki J Carsonis a 34 y.o.maleveryunfortunate young manwith medical history significant ofmetastatic lung cancer who presents to the emergency department with complaints of headache,chest pain,back pain, abdominal pain. He was bothered by pain in upper back and chest. He was also having abdominal discomfort and severe headache. There is no report of fever, chills, nausea, vomiting, dysuria or shortness of breath.He has been currently treated with radiation therapy and is starting on chemotherapy soon. He was diagnosed with DVT on 06/04/2019 and was diagnosed with PE on his last admission. He was treated with heparin drip for his PE and was discharged on Xarelto. He also recently had a Chemo-Port placement on 06/12/2019. On presentation, he was in sinus tachycardia, quite hypertensive most likely secondary to pain. X-rays show continued extensive, expanding tumor burden. Patient was admitted for pain management, supportive care and possibly starting urgent radiation therapy for his cord compression on the T-spine.He has been started on Decadron. Palliative care consultation. Oncology and radiation oncology consulted.  PMT consulted for cancer related pain management.    Today, patient still reports ongoing pain, worse with movement, although currently controlled with current regimen.    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 His oncologist Dr. Lorenso Courier following Management directed by oncology and palliative care Chemo-Port in place on right chest. Cancer related pain management  per PMT Oncology on board, plan to start systemic chemotherapy on 06/23/2019 Rad-onc also on board  T-spine cord compression Continue IV Decadron 4 mg twice daily Continue pain management as recommended by PMT  DVT/PE, recently diagnosed Continue oral anticoagulation, Xarelto O2 saturation 99% on room air  Intractable cough Continue Tessalon Perles  Transaminitis Likely from mets to liver Daily CMP  Anemia of chronic disease in the setting of malignancy/thrombocytopenia Hemoglobin stable with no overt bleeding Daily CBC  Headache/left eye vision loss Primary adenocarcinoma the right with mets to the brain and left retina  Goals of care Palliative care team following Currently a full code    DVT prophylaxis: [id number]  Code Status: Full code  Family Communication:  Discussed with wife at bedside on 06/21/2019    Consultants:   Palliative care  Neurosurgery  Radiation Oncology  Medical Oncology  Procedures:  None     Status is: Inpatient   Dispo: The patient is from: Home.              Anticipated d/c is to: Home in the next 24-48 hours               Anticipated d/c date is: TBD               Patient currently ongoing oncology treatment and pain management with IV pain medications.        Objective: Vitals:   06/21/19 1514 06/21/19 2214 06/22/19 0712 06/22/19 1320  BP: (!) 149/90 134/81 (!) 153/92 (!) 158/93  Pulse: 83 81 75 80  Resp: 17 20 18 19   Temp: 98.2 F (36.8 C) 97.9 F (36.6 C) 98 F (36.7 C) 98.1 F (36.7 C)  TempSrc: Oral Oral Oral Oral  SpO2: 99% 100% 99% 100%  Weight:      Height:        Intake/Output Summary (Last 24 hours) at 06/22/2019 1911 Last data filed at 06/22/2019 1900 Gross per 24 hour  Intake 2177.8 ml  Output 1520 ml  Net 657.8 ml   Filed Weights   06/17/19 1821  Weight: 99 kg    Exam:  General: NAD   Cardiovascular: S1, S2 present  Respiratory: CTAB  Abdomen: Soft, TTP, nondistended, bowel  sounds present  Musculoskeletal: No bilateral pedal edema noted  Skin: Normal  Psychiatry: Normal mood   Data Reviewed: CBC: Recent Labs  Lab 06/18/19 0450 06/19/19 0438 06/20/19 0935 06/21/19 0333 06/22/19 0327  WBC 7.0 8.3 7.4 6.9 7.7  NEUTROABS  --   --  6.5 6.2 6.9  HGB 11.0* 9.7* 10.0* 10.4* 10.1*  HCT 35.3* 30.8* 31.9* 32.6* 32.5*  MCV 88.0 87.0 86.9 87.9 88.1  PLT 114* 122* 134* 120* 875*   Basic Metabolic Panel: Recent Labs  Lab 06/18/19 0450 06/19/19 0820 06/20/19 0935 06/21/19 0333 06/22/19 0327  NA 140 142 140 142 143  K 4.7 4.0 4.1 4.4 4.3  CL 105 107 104 107 106  CO2 25 25 26 26 23   GLUCOSE 224* 133* 182* 142* 163*  BUN 22* 22* 22* 21* 22*  CREATININE 1.43* 1.19 1.20 1.19 1.15  CALCIUM 8.9 8.5* 8.4* 8.5* 8.1*  MG  --  1.9  --   --   --   PHOS  --  4.4  --   --   --    GFR: Estimated Creatinine Clearance: 101.5 mL/min (by C-G formula based on SCr of 1.15 mg/dL). Liver Function Tests: Recent Labs  Lab 06/18/19 0450 06/19/19 0820 06/20/19 0935 06/21/19 0333 06/22/19 0327  AST 146* 76* 57* 78* 64*  ALT 355* 275* 243* 241* 217*  ALKPHOS 328* 298* 331* 340* 319*  BILITOT 0.6 <0.1* 0.5 0.6 0.4  PROT 6.1* 5.7* 5.8* 5.9* 5.6*  ALBUMIN 2.9* 2.7* 2.8* 2.8* 2.6*   No results for input(s): LIPASE, AMYLASE in the last 168 hours. No results for input(s): AMMONIA in the last 168 hours. Coagulation Profile: No results for input(s): INR, PROTIME in the last 168 hours. Cardiac Enzymes: No results for input(s): CKTOTAL, CKMB, CKMBINDEX, TROPONINI in the last 168 hours. BNP (last 3 results) No results for input(s): PROBNP in the last 8760 hours. HbA1C: No results for input(s): HGBA1C in the last 72 hours. CBG: No results for input(s): GLUCAP in the last 168 hours. Lipid Profile: No results for input(s): CHOL, HDL, LDLCALC, TRIG, CHOLHDL, LDLDIRECT in the last 72 hours. Thyroid Function Tests: No results for input(s): TSH, T4TOTAL, FREET4, T3FREE,  THYROIDAB in the last 72 hours. Anemia Panel: No results for input(s): VITAMINB12, FOLATE, FERRITIN, TIBC, IRON, RETICCTPCT in the last 72 hours. Urine analysis:    Component Value Date/Time   COLORURINE YELLOW 06/02/2019 1838   APPEARANCEUR CLEAR 06/02/2019 1838   LABSPEC 1.016 06/02/2019 1838   PHURINE 5.0 06/02/2019 1838   GLUCOSEU NEGATIVE 06/02/2019 1838   HGBUR LARGE (A) 06/02/2019 1838   BILIRUBINUR NEGATIVE 06/02/2019 1838   KETONESUR NEGATIVE 06/02/2019 1838   PROTEINUR NEGATIVE 06/02/2019 1838   UROBILINOGEN 1.0 05/02/2014 1253   NITRITE NEGATIVE 06/02/2019 1838   LEUKOCYTESUR NEGATIVE 06/02/2019 1838   Sepsis Labs: @LABRCNTIP (procalcitonin:4,lacticidven:4)  ) Recent Results (from the past 240 hour(s))  SARS Coronavirus 2 by RT PCR (hospital order, performed in [facility] hospital lab) Nasopharyngeal Nasopharyngeal Swab     Status: None  Collection Time: 06/13/19  1:42 PM   Specimen: Nasopharyngeal Swab  Result Value Ref Range Status   SARS Coronavirus 2 NEGATIVE NEGATIVE Final    Comment: (NOTE) SARS-CoV-2 target nucleic acids are NOT DETECTED. The SARS-CoV-2 RNA is generally detectable in upper and lower respiratory specimens during the acute phase of infection. The lowest concentration of SARS-CoV-2 viral copies this assay can detect is 250 copies / mL. A negative result does not preclude SARS-CoV-2 infection and should not be used as the sole basis for treatment or other patient management decisions.  A negative result may occur with improper specimen collection / handling, submission of specimen other than nasopharyngeal swab, presence of viral mutation(s) within the areas targeted by this assay, and inadequate number of viral copies (<250 copies / mL). A negative result must be combined with clinical observations, patient history, and epidemiological information. Fact Sheet for Patients:   StrictlyIdeas.no Fact Sheet for  Healthcare Providers: BankingDealers.co.za This test is not yet approved or cleared  by the [place] FDA and has been authorized for detection and/or diagnosis of SARS-CoV-2 by FDA under an Emergency Use Authorization (EUA).  This EUA will remain in effect (meaning this test can be used) for the duration of the COVID-19 declaration under Section 564(b)(1) of the Act, 21 U.S.C. section 360bbb-3(b)(1), unless the authorization is terminated or revoked sooner. Performed at [facility], [place] [address] [place], [place] [address]   Blood culture (routine x 2)     Status: None   Collection Time: 06/13/19  6:35 PM   Specimen: BLOOD  Result Value Ref Range Status   Specimen Description   Final    BLOOD PORTA CATH Performed at [place] [address], [place], [place] [address]    Special Requests   Final    BOTTLES DRAWN AEROBIC AND ANAEROBIC Blood Culture adequate volume Performed at [place] [address] [place], [place] [address]    Culture   Final    NO GROWTH 5 DAYS Performed at [facility], [place] [address], [place], [place] [address]    Report Status 06/18/2019 FINAL  Final  SARS Coronavirus 2 by RT PCR (hospital order, performed in [facility] hospital lab) Nasopharyngeal Nasopharyngeal Swab     Status: None   Collection Time: 06/17/19  5:00 PM   Specimen: Nasopharyngeal Swab  Result Value Ref Range Status   SARS Coronavirus 2 NEGATIVE NEGATIVE Final    Comment: (NOTE) SARS-CoV-2 target nucleic acids are NOT DETECTED. The SARS-CoV-2 RNA is generally detectable in upper and lower respiratory specimens during the acute phase of infection. The lowest concentration of SARS-CoV-2 viral copies this assay can detect is 250 copies / mL. A negative result does not preclude SARS-CoV-2 infection and should not be used as the sole basis for treatment or other patient management  decisions.  A negative result may occur with improper specimen collection / handling, submission of specimen other than nasopharyngeal swab, presence of viral mutation(s) within the areas targeted by this assay, and inadequate number of viral copies (<250 copies / mL). A negative result must be combined with clinical observations, patient history, and epidemiological information. Fact Sheet for Patients:   StrictlyIdeas.no Fact Sheet for Healthcare Providers: BankingDealers.co.za This test is not yet approved or cleared  by the [place] FDA and has been authorized for detection and/or diagnosis of SARS-CoV-2 by FDA under an Emergency Use Authorization (EUA).  This EUA will remain in effect (  meaning this test can be used) for the duration of the COVID-19 declaration under Section 564(b)(1) of the Act, 21 U.S.C. section 360bbb-3(b)(1), unless the authorization is terminated or revoked sooner. Performed at [facility], [place] [address] [place], [place] [address]       Studies: No results found.  Scheduled Meds: . Chlorhexidine Gluconate Cloth  6 each Topical Daily  . dexamethasone (DECADRON) injection  4 mg Intravenous Q12H  . fentaNYL  1 patch Transdermal Q72H  .  HYDROmorphone (DILAUDID) injection  1 mg Intravenous Once  . metoprolol tartrate  12.5 mg Oral BID  . pantoprazole  40 mg Oral BID  . polyethylene glycol  17 g Oral Daily  . rivaroxaban  15 mg Oral BID WC   Followed by  . [START ON 07/08/2019] rivaroxaban  20 mg Oral Q supper  . senna  2 tablet Oral BID  . sodium chloride flush  10-40 mL Intracatheter Q12H    Continuous Infusions: . sodium chloride 75 mL/hr at 06/21/19 2228     LOS: 5 days     Alma Friendly, MD [facility]  If 7PM-7AM, please contact night-coverage [email or web address] 06/22/2019, 7:11 PM

## 2019-06-23 ENCOUNTER — Inpatient Hospital Stay: Payer: Medicaid Other

## 2019-06-23 ENCOUNTER — Inpatient Hospital Stay: Payer: Medicaid Other | Admitting: Hematology and Oncology

## 2019-06-23 ENCOUNTER — Inpatient Hospital Stay: Payer: Medicaid Other | Admitting: Internal Medicine

## 2019-06-23 ENCOUNTER — Encounter: Payer: Self-pay | Admitting: Pharmacy Technician

## 2019-06-23 LAB — COMPREHENSIVE METABOLIC PANEL WITH GFR
ALT: 231 U/L — ABNORMAL HIGH (ref 0–44)
AST: 70 U/L — ABNORMAL HIGH (ref 15–41)
Albumin: 2.8 g/dL — ABNORMAL LOW (ref 3.5–5.0)
Alkaline Phosphatase: 361 U/L — ABNORMAL HIGH (ref 38–126)
Anion gap: 10 (ref 5–15)
BUN: 24 mg/dL — ABNORMAL HIGH (ref 6–20)
CO2: 23 mmol/L (ref 22–32)
Calcium: 8.5 mg/dL — ABNORMAL LOW (ref 8.9–10.3)
Chloride: 107 mmol/L (ref 98–111)
Creatinine, Ser: 1.16 mg/dL (ref 0.61–1.24)
GFR calc Af Amer: >60 mL/min (ref >60)
GFR calc non Af Amer: >60 mL/min (ref >60)
Glucose, Bld: 142 mg/dL — ABNORMAL HIGH (ref 70–99)
Potassium: 4.1 mmol/L (ref 3.5–5.1)
Sodium: 140 mmol/L (ref 135–145)
Total Bilirubin: 0.4 mg/dL (ref 0.3–1.2)
Total Protein: 5.9 g/dL — ABNORMAL LOW (ref 6.5–8.1)

## 2019-06-23 LAB — CBC WITH DIFFERENTIAL/PLATELET
Abs Immature Granulocytes: 0.23 10*3/uL — ABNORMAL HIGH (ref 0.00–0.07)
Basophils Absolute: 0 10*3/uL (ref 0.0–0.1)
Basophils Relative: 0 %
Eosinophils Absolute: 0 10*3/uL (ref 0.0–0.5)
Eosinophils Relative: 0 %
HCT: 33.4 % — ABNORMAL LOW (ref 39.0–52.0)
Hemoglobin: 10.4 g/dL — ABNORMAL LOW (ref 13.0–17.0)
Immature Granulocytes: 3 %
Lymphocytes Relative: 1 %
Lymphs Abs: 0.1 10*3/uL — ABNORMAL LOW (ref 0.7–4.0)
MCH: 27.4 pg (ref 26.0–34.0)
MCHC: 31.1 g/dL (ref 30.0–36.0)
MCV: 88.1 fL (ref 80.0–100.0)
Monocytes Absolute: 0.6 10*3/uL (ref 0.1–1.0)
Monocytes Relative: 7 %
Neutro Abs: 8.2 10*3/uL — ABNORMAL HIGH (ref 1.7–7.7)
Neutrophils Relative %: 89 %
Platelets: 164 10*3/uL (ref 150–400)
RBC: 3.79 MIL/uL — ABNORMAL LOW (ref 4.22–5.81)
RDW: 17.3 % — ABNORMAL HIGH (ref 11.5–15.5)
WBC: 9.1 10*3/uL (ref 4.0–10.5)
nRBC: 0.5 % — ABNORMAL HIGH (ref 0.0–0.2)

## 2019-06-23 LAB — TSH: TSH: 0.744 u[IU]/mL (ref 0.350–4.500)

## 2019-06-23 MED ORDER — HYDRALAZINE HCL 20 MG/ML IJ SOLN: 10.0000 mg | Freq: Three times a day (TID) | INTRAMUSCULAR | Status: DC | PRN

## 2019-06-23 MED ORDER — SODIUM CHLORIDE 0.9 % IV SOLN
150.0000 mg | Freq: Once | INTRAVENOUS | Status: AC
  Administered 2019-06-23: 150 mg via INTRAVENOUS
  Filled 2019-06-23: qty 5

## 2019-06-23 MED ORDER — SODIUM CHLORIDE 0.9 % IV SOLN
726.0000 mg | Freq: Once | INTRAVENOUS | Status: AC
  Administered 2019-06-23: 730 mg via INTRAVENOUS
  Filled 2019-06-23: qty 60

## 2019-06-23 MED ORDER — PALONOSETRON HCL INJECTION 0.25 MG/5ML
0.2500 mg | Freq: Once | INTRAVENOUS | Status: AC
  Administered 2019-06-23: 0.25 mg via INTRAVENOUS
  Filled 2019-06-23: qty 5

## 2019-06-23 MED ORDER — FOLIC ACID 1 MG PO TABS
1.0000 mg | ORAL_TABLET | Freq: Every day | ORAL | Status: DC
  Administered 2019-06-23 – 2019-06-28 (×6): 1 mg via ORAL
  Filled 2019-06-23 (×6): qty 1

## 2019-06-23 MED ORDER — CYANOCOBALAMIN 1000 MCG/ML IJ SOLN
1000.0000 ug | Freq: Once | INTRAMUSCULAR | Status: AC
  Administered 2019-06-23: 1000 ug via INTRAMUSCULAR
  Filled 2019-06-23: qty 1

## 2019-06-23 MED ORDER — OXYCODONE-ACETAMINOPHEN 7.5-325 MG PO TABS
2.0000 | ORAL_TABLET | Freq: Four times a day (QID) | ORAL | Status: DC | PRN
  Administered 2019-06-24 – 2019-06-25 (×2): 2 via ORAL
  Filled 2019-06-23 (×2): qty 2

## 2019-06-23 MED ORDER — SODIUM CHLORIDE 0.9 % IV SOLN
10.0000 mg | Freq: Once | INTRAVENOUS | Status: AC
  Administered 2019-06-23: 10 mg via INTRAVENOUS
  Filled 2019-06-23: qty 1

## 2019-06-23 MED ORDER — SODIUM CHLORIDE 0.9 % IV SOLN
500.0000 mg/m2 | Freq: Once | INTRAVENOUS | Status: AC
  Administered 2019-06-23: 1100 mg via INTRAVENOUS
  Filled 2019-06-23: qty 40

## 2019-06-23 NOTE — Progress Notes (Signed)
PT Cancellation Note  Patient Details Name: William Ali MRN: [id number] DOB: November 27, 1985   Cancelled Treatment:    Reason Eval/Treat Not Completed: Patient declined, no reason specified(Patient resting in bed with pre-meds running for chemotherapy. Pt reports he is still having pain in back and that he feels very tired and wants to rest. Educated pt on importance of mobilizing to maintain activity tolerance and independence. Patient still declines and requests therapist return later. Will follow up at later date/time as schedule allows.)   Verner Mould, DPT Physical Therapist with [facility] [phone]  06/23/2019 10:51 AM

## 2019-06-23 NOTE — Progress Notes (Signed)
OT Cancellation Note  Patient Details Name: CASPIAN DELEONARDIS MRN: [id number] DOB: [birth year]   Cancelled Treatment:    Reason Eval/Treat Not Completed: Other (comment)(awaiting chemotherapy treatment). Patient reporting pain in abdomen due to B12 shot he received this morning. Educate patient on role of OT, inquired about any concerns for D/C home. Patient states they received the walker and bedside commode and are trying to move into a 1 story home. Patient currently being set up for chemotherapy treatment. Will re-attempt as schedule permits.  Delbert Phenix OT Pager: [place] 06/23/2019, 11:04 AM

## 2019-06-23 NOTE — Progress Notes (Signed)
Spoke w/ Dr. Lorenso Courier - okay to treat today with yesterday's labs. LFT elevation noted. Patient initiate daily folic acid 1mg  PO and B12 injection today.   William Ali, PharmD, BCPS, [place] [facility] Pharmacist Pharmacy Phone: [phone] 06/23/2019

## 2019-06-23 NOTE — Progress Notes (Signed)
Patient has been approved for drug assistance by [employer] for [employer]. The enrollment period is from 06/22/19-06/20/20 based on self pay. First DOS covered is 06/23/19.

## 2019-06-23 NOTE — Progress Notes (Signed)
Patient has been approved for drug assistance by [employer] for [employer]. The enrollment period is from 06/21/19-06/19/20 based on self pay. First DOS covered is 06/23/19.

## 2019-06-23 NOTE — Progress Notes (Signed)
Patient tolerated chemotherapy without any issues noted.  Zandra Abts [facility]  06/23/2019  1:44 PM

## 2019-06-23 NOTE — Progress Notes (Signed)
PROGRESS NOTE  [name] YJE:[id number] DOB: 04/10/1985 DOA: 06/17/2019 PCP: Patient, No Pcp Per  HPI/Recap of past 24 hours: William Ali a 34 y.o.maleveryunfortunate young manwith medical history significant ofmetastatic lung cancer who presents to the emergency department with complaints of headache,chest pain,back pain, abdominal pain. He was bothered by pain in upper back and chest. He was also having abdominal discomfort and severe headache. There is no report of fever, chills, nausea, vomiting, dysuria or shortness of breath.He has been currently treated with radiation therapy and is starting on chemotherapy soon. He was diagnosed with DVT on 06/04/2019 and was diagnosed with PE on his last admission. He was treated with heparin drip for his PE and was discharged on Xarelto. He also recently had a Chemo-Port placement on 06/12/2019. On presentation, he was in sinus tachycardia, quite hypertensive most likely secondary to pain. X-rays show continued extensive, expanding tumor burden. Patient was admitted for pain management, supportive care and possibly starting urgent radiation therapy for his cord compression on the T-spine.He has been started on Decadron. Palliative care consultation. Oncology and radiation oncology consulted.  PMT consulted for cancer related pain management.    Today, saw pt prior to receiving chemotherapy, denies any new complaints.     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 His oncologist Dr. Lorenso Courier following Management directed by oncology and palliative care Chemo-Port in place on right chest. Cancer related pain management per PMT Oncology on board, received his  first dose of chemo (pemetrexed and carboplatin) on 06/23/2019 without any issues Rad-onc also on board  T-spine cord compression Continue IV Decadron 4 mg twice daily Continue pain management as recommended by PMT  DVT/PE, recently diagnosed Continue oral anticoagulation, Xarelto  Intractable cough Continue Tessalon Perles  Transaminitis Likely from mets to liver Daily CMP  Anemia of chronic disease in the setting of malignancy/thrombocytopenia Hemoglobin stable with no overt bleeding Daily CBC  Headache/left eye vision loss Primary adenocarcinoma the right with mets to the brain and left retina  Goals of care Palliative care team following Currently a full code    DVT prophylaxis: [id number]  Code Status: Full code  Family Communication:  Discussed with wife at bedside on 06/23/2019    Consultants:   Palliative care  Neurosurgery  Radiation Oncology  Medical Oncology  Procedures:  None     Status is: Inpatient   Dispo: The patient is from: Home.              Anticipated d/c is to: Home in the next 24-48 hours               Anticipated d/c date is: TBD               Patient currently ongoing oncology treatment and pain management with IV pain medications.    Objective: Vitals:   06/22/19 1320 06/22/19 2111 06/23/19 0604 06/23/19 1324  BP: (!) 158/93 (!) 148/85 138/86 (!) 166/96  Pulse: 80 86 81 82  Resp: 19 16 16 16   Temp: 98.1 F (36.7 C) 98.2 F (36.8 C) 98 F (36.7 C) 97.6 F (36.4 C)  TempSrc: Oral Oral Oral Oral  SpO2: 100% 99% 97% 100%  Weight:  Height:        Intake/Output Summary (Last 24 hours) at 06/23/2019 1506 Last data filed at 06/23/2019 1300 Gross per 24 hour  Intake 2814.78 ml  Output 2300 ml  Net 514.78 ml   Filed Weights   06/17/19 1821  Weight: 99 kg    Exam:  General: NAD   Cardiovascular: S1, S2 present  Respiratory: CTAB  Abdomen: Soft, TTP, nondistended, bowel sounds  present  Musculoskeletal: No bilateral pedal edema noted  Skin: Normal  Psychiatry: Normal mood   Data Reviewed: CBC: Recent Labs  Lab 06/19/19 0438 06/20/19 0935 06/21/19 0333 06/22/19 0327 06/23/19 1009  WBC 8.3 7.4 6.9 7.7 9.1  NEUTROABS  --  6.5 6.2 6.9 8.2*  HGB 9.7* 10.0* 10.4* 10.1* 10.4*  HCT 30.8* 31.9* 32.6* 32.5* 33.4*  MCV 87.0 86.9 87.9 88.1 88.1  PLT 122* 134* 120* 125* 458   Basic Metabolic Panel: Recent Labs  Lab 06/19/19 0820 06/20/19 0935 06/21/19 0333 06/22/19 0327 06/23/19 1009  NA 142 140 142 143 140  K 4.0 4.1 4.4 4.3 4.1  CL 107 104 107 106 107  CO2 25 26 26 23 23   GLUCOSE 133* 182* 142* 163* 142*  BUN 22* 22* 21* 22* 24*  CREATININE 1.19 1.20 1.19 1.15 1.16  CALCIUM 8.5* 8.4* 8.5* 8.1* 8.5*  MG 1.9  --   --   --   --   PHOS 4.4  --   --   --   --    GFR: Estimated Creatinine Clearance: 100.6 mL/min (by C-G formula based on SCr of 1.16 mg/dL). Liver Function Tests: Recent Labs  Lab 06/19/19 0820 06/20/19 0935 06/21/19 0333 06/22/19 0327 06/23/19 1009  AST 76* 57* 78* 64* 70*  ALT 275* 243* 241* 217* 231*  ALKPHOS 298* 331* 340* 319* 361*  BILITOT <0.1* 0.5 0.6 0.4 0.4  PROT 5.7* 5.8* 5.9* 5.6* 5.9*  ALBUMIN 2.7* 2.8* 2.8* 2.6* 2.8*   No results for input(s): LIPASE, AMYLASE in the last 168 hours. No results for input(s): AMMONIA in the last 168 hours. Coagulation Profile: No results for input(s): INR, PROTIME in the last 168 hours. Cardiac Enzymes: No results for input(s): CKTOTAL, CKMB, CKMBINDEX, TROPONINI in the last 168 hours. BNP (last 3 results) No results for input(s): PROBNP in the last 8760 hours. HbA1C: No results for input(s): HGBA1C in the last 72 hours. CBG: No results for input(s): GLUCAP in the last 168 hours. Lipid Profile: No results for input(s): CHOL, HDL, LDLCALC, TRIG, CHOLHDL, LDLDIRECT in the last 72 hours. Thyroid Function Tests: Recent Labs    06/23/19 1009  TSH 0.744   Anemia Panel: No  results for input(s): VITAMINB12, FOLATE, FERRITIN, TIBC, IRON, RETICCTPCT in the last 72 hours. Urine analysis:    Component Value Date/Time   COLORURINE YELLOW 06/02/2019 1838   APPEARANCEUR CLEAR 06/02/2019 1838   LABSPEC 1.016 06/02/2019 1838   PHURINE 5.0 06/02/2019 1838   GLUCOSEU NEGATIVE 06/02/2019 1838   HGBUR LARGE (A) 06/02/2019 1838   BILIRUBINUR NEGATIVE 06/02/2019 1838   KETONESUR NEGATIVE 06/02/2019 1838   PROTEINUR NEGATIVE 06/02/2019 1838   UROBILINOGEN 1.0 05/02/2014 1253   NITRITE NEGATIVE 06/02/2019 1838   LEUKOCYTESUR NEGATIVE 06/02/2019 1838   Sepsis Labs: @LABRCNTIP (procalcitonin:4,lacticidven:4)  ) Recent Results (from the past 240 hour(s))  Blood culture (routine x 2)     Status: None   Collection Time: 06/13/19  6:35 PM   Specimen: BLOOD  Result Value Ref Range Status   Specimen Description  Final    BLOOD PORTA CATH Performed at [facility], [place] [address] [place], [place] [address]    Special Requests   Final    BOTTLES DRAWN AEROBIC AND ANAEROBIC Blood Culture adequate volume Performed at [place] [address] [place], [place] [address]    Culture   Final    NO GROWTH 5 DAYS Performed at [facility], [place] [address], [place], [place] [address]    Report Status 06/18/2019 FINAL  Final  SARS Coronavirus 2 by RT PCR (hospital order, performed in [facility] hospital lab) Nasopharyngeal Nasopharyngeal Swab     Status: None   Collection Time: 06/17/19  5:00 PM   Specimen: Nasopharyngeal Swab  Result Value Ref Range Status   SARS Coronavirus 2 NEGATIVE NEGATIVE Final    Comment: (NOTE) SARS-CoV-2 target nucleic acids are NOT DETECTED. The SARS-CoV-2 RNA is generally detectable in upper and lower respiratory specimens during the acute phase of infection. The lowest concentration of SARS-CoV-2 viral copies this assay can detect is 250 copies / mL. A negative result does not preclude SARS-CoV-2  infection and should not be used as the sole basis for treatment or other patient management decisions.  A negative result may occur with improper specimen collection / handling, submission of specimen other than nasopharyngeal swab, presence of viral mutation(s) within the areas targeted by this assay, and inadequate number of viral copies (<250 copies / mL). A negative result must be combined with clinical observations, patient history, and epidemiological information. Fact Sheet for Patients:   StrictlyIdeas.no Fact Sheet for Healthcare Providers: BankingDealers.co.za This test is not yet approved or cleared  by the [place] FDA and has been authorized for detection and/or diagnosis of SARS-CoV-2 by FDA under an Emergency Use Authorization (EUA).  This EUA will remain in effect (meaning this test can be used) for the duration of the COVID-19 declaration under Section 564(b)(1) of the Act, 21 U.S.C. section 360bbb-3(b)(1), unless the authorization is terminated or revoked sooner. Performed at [facility], [place] [address] [place], [place] [address]       Studies: No results found.  Scheduled Meds: . Chlorhexidine Gluconate Cloth  6 each Topical Daily  . dexamethasone (DECADRON) injection  4 mg Intravenous Q12H  . fentaNYL  1 patch Transdermal Q72H  . folic acid  1 mg Oral Daily  . metoprolol tartrate  12.5 mg Oral BID  . pantoprazole  40 mg Oral BID  . polyethylene glycol  17 g Oral Daily  . rivaroxaban  15 mg Oral BID WC   Followed by  . [START ON 07/08/2019] rivaroxaban  20 mg Oral Q supper  . senna  2 tablet Oral BID  . sodium chloride flush  10-40 mL Intracatheter Q12H    Continuous Infusions: . sodium chloride 75 mL/hr at 06/23/19 1247     LOS: 6 days     Alma Friendly, MD [facility]  If 7PM-7AM, please contact night-coverage [email or web address] 06/23/2019, 3:06 PM

## 2019-06-23 NOTE — Progress Notes (Signed)
Patient due for first doses of Pemetrexed and Carboplatin.  Chemotherapy education provided to patient and his spouse.  Educational sheets for each medication provided to patient/spouse.  Questions answered.  Patient signed Chemotherapy consent form and form placed on shadow chart.  Zandra Abts [facility]  06/23/2019  10:40 AM

## 2019-06-24 LAB — T4: T4, Total: 5.8 ug/dL (ref 4.5–12.0)

## 2019-06-24 LAB — CBC WITH DIFFERENTIAL/PLATELET
Abs Immature Granulocytes: 0.23 10*3/uL — ABNORMAL HIGH (ref 0.00–0.07)
Basophils Absolute: 0 10*3/uL (ref 0.0–0.1)
Basophils Relative: 0 %
Eosinophils Absolute: 0 10*3/uL (ref 0.0–0.5)
Eosinophils Relative: 0 %
HCT: 33.9 % — ABNORMAL LOW (ref 39.0–52.0)
Hemoglobin: 10.7 g/dL — ABNORMAL LOW (ref 13.0–17.0)
Immature Granulocytes: 2 %
Lymphocytes Relative: 1 %
Lymphs Abs: 0.1 10*3/uL — ABNORMAL LOW (ref 0.7–4.0)
MCH: 27.5 pg (ref 26.0–34.0)
MCHC: 31.6 g/dL (ref 30.0–36.0)
MCV: 87.1 fL (ref 80.0–100.0)
Monocytes Absolute: 0.8 10*3/uL (ref 0.1–1.0)
Monocytes Relative: 7 %
Neutro Abs: 9.6 10*3/uL — ABNORMAL HIGH (ref 1.7–7.7)
Neutrophils Relative %: 90 %
Platelets: 153 10*3/uL (ref 150–400)
RBC: 3.89 MIL/uL — ABNORMAL LOW (ref 4.22–5.81)
RDW: 17.5 % — ABNORMAL HIGH (ref 11.5–15.5)
WBC: 10.8 10*3/uL — ABNORMAL HIGH (ref 4.0–10.5)
nRBC: 0.4 % — ABNORMAL HIGH (ref 0.0–0.2)

## 2019-06-24 LAB — COMPREHENSIVE METABOLIC PANEL
ALT: 214 U/L — ABNORMAL HIGH (ref 0–44)
AST: 48 U/L — ABNORMAL HIGH (ref 15–41)
Albumin: 3.1 g/dL — ABNORMAL LOW (ref 3.5–5.0)
Alkaline Phosphatase: 373 U/L — ABNORMAL HIGH (ref 38–126)
Anion gap: 8 (ref 5–15)
BUN: 27 mg/dL — ABNORMAL HIGH (ref 6–20)
CO2: 25 mmol/L (ref 22–32)
Calcium: 8.6 mg/dL — ABNORMAL LOW (ref 8.9–10.3)
Chloride: 104 mmol/L (ref 98–111)
Creatinine, Ser: 1.18 mg/dL (ref 0.61–1.24)
GFR calc Af Amer: >60 mL/min (ref >60)
GFR calc non Af Amer: >60 mL/min (ref >60)
Glucose, Bld: 168 mg/dL — ABNORMAL HIGH (ref 70–99)
Potassium: 4.2 mmol/L (ref 3.5–5.1)
Sodium: 137 mmol/L (ref 135–145)
Total Bilirubin: 0.5 mg/dL (ref 0.3–1.2)
Total Protein: 6.4 g/dL — ABNORMAL LOW (ref 6.5–8.1)

## 2019-06-24 MED ORDER — KETOROLAC TROMETHAMINE 15 MG/ML IJ SOLN
15.0000 mg | Freq: Four times a day (QID) | INTRAMUSCULAR | Status: DC | PRN
  Administered 2019-06-26 – 2019-06-27 (×3): 15 mg via INTRAVENOUS
  Filled 2019-06-24 (×3): qty 1

## 2019-06-24 NOTE — Progress Notes (Signed)
PROGRESS NOTE  William Ali ACZ:[id number] DOB: 1985-07-25 DOA: 06/17/2019 PCP: Patient, No Pcp Per  HPI/Recap of past 24 hours: Oak J Carsonis a 34 y.o.maleveryunfortunate young manwith medical history significant ofmetastatic lung cancer who presents to the emergency department with complaints of headache,chest pain,back pain, abdominal pain. He was bothered by pain in upper back and chest. He was also having abdominal discomfort and severe headache. There is no report of fever, chills, nausea, vomiting, dysuria or shortness of breath.He has been currently treated with radiation therapy and is starting on chemotherapy soon. He was diagnosed with DVT on 06/04/2019 and was diagnosed with PE on his last admission. He was treated with heparin drip for his PE and was discharged on Xarelto. He also recently had a Chemo-Port placement on 06/12/2019. On presentation, he was in sinus tachycardia, quite hypertensive most likely secondary to pain. X-rays show continued extensive, expanding tumor burden. Patient was admitted for pain management, supportive care and possibly starting urgent radiation therapy for his cord compression on the T-spine.He has been started on Decadron. Palliative care consultation. Oncology and radiation oncology consulted.  PMT consulted for cancer related pain management.    Today, patient has no new complaints, just still complains of abdominal distention and pain right after he eats, with some nausea.  Denies any vomiting, chest pain, shortness of breath, fever/chills.  Reported some numbness around his mouth, although stated its been ongoing for about 3 weeks.    Assessment/Plan: Principal Problem:   Metastatic primary lung cancer ([place]) Active Problems:   Goals of care, counseling/discussion   Pulmonary embolism ([place])   Metastasis of neoplasm to spinal canal [facility])   Palliative care encounter  Metastatic adenocarcinoma of the right lung with mets to  brain, spine and liver. His oncologist Dr. Lorenso Courier following Management directed by oncology and palliative care Chemo-Port in place on right chest. Cancer related pain management per PMT Oncology on board, received his first dose of chemo (pemetrexed and carboplatin) on 06/23/2019 without any issues Rad-onc also on board  T-spine cord compression Continue IV Decadron 4 mg twice daily Continue pain management as recommended by PMT  DVT/PE, recently diagnosed Continue oral anticoagulation, Xarelto  Intractable cough Continue Tessalon Perles  Transaminitis Likely from mets to liver Daily CMP  Anemia of chronic disease in the setting of malignancy/thrombocytopenia Hemoglobin stable with no overt bleeding Daily CBC  Headache/left eye vision loss Primary adenocarcinoma the right with mets to the brain and left retina  Goals of care Palliative care team following Currently a full code    DVT prophylaxis: [id number]  Code Status: Full code  Family Communication:  Discussed with wife at bedside on 06/23/2019    Consultants:   Palliative care  Neurosurgery  Radiation Oncology  Medical Oncology  Procedures:  None     Status is: Inpatient   Dispo: The patient is from: Home.              Anticipated d/c is to: Home in the next 24-48 hours               Anticipated d/c date is: TBD               Patient currently ongoing oncology treatment and pain management with IV pain medications.    Objective: Vitals:   06/23/19 0604 06/23/19 1324 06/23/19 2000 06/24/19 0638  BP: 138/86 (!) 166/96 (!) 153/87 (!) 149/87  Pulse: 81 82 84 76  Resp: 16 16 16 16   Temp:  98 F (36.7 C) 97.6 F (36.4 C) 97.9 F (36.6 C) 97.9 F (36.6 C)  TempSrc: Oral Oral Oral Oral  SpO2: 97% 100% 96% 98%  Weight:      Height:        Intake/Output Summary (Last 24 hours) at 06/24/2019 1329 Last data filed at 06/24/2019 2409 Gross per 24 hour  Intake 377.55 ml  Output 900 ml  Net  -522.45 ml   Filed Weights   06/17/19 1821  Weight: 99 kg    Exam:  General: NAD   Cardiovascular: S1, S2 present  Respiratory: CTAB  Abdomen: Soft, TTP, nondistended, bowel sounds present  Musculoskeletal: No bilateral pedal edema noted  Skin: Normal  Psychiatry: Normal mood   Data Reviewed: CBC: Recent Labs  Lab 06/20/19 0935 06/21/19 0333 06/22/19 0327 06/23/19 1009 06/24/19 0550  WBC 7.4 6.9 7.7 9.1 10.8*  NEUTROABS 6.5 6.2 6.9 8.2* 9.6*  HGB 10.0* 10.4* 10.1* 10.4* 10.7*  HCT 31.9* 32.6* 32.5* 33.4* 33.9*  MCV 86.9 87.9 88.1 88.1 87.1  PLT 134* 120* 125* 164 735   Basic Metabolic Panel: Recent Labs  Lab 06/19/19 0820 06/19/19 0820 06/20/19 0935 06/21/19 0333 06/22/19 0327 06/23/19 1009 06/24/19 0550  NA 142   < > 140 142 143 140 137  K 4.0   < > 4.1 4.4 4.3 4.1 4.2  CL 107   < > 104 107 106 107 104  CO2 25   < > 26 26 23 23 25   GLUCOSE 133*   < > 182* 142* 163* 142* 168*  BUN 22*   < > 22* 21* 22* 24* 27*  CREATININE 1.19   < > 1.20 1.19 1.15 1.16 1.18  CALCIUM 8.5*   < > 8.4* 8.5* 8.1* 8.5* 8.6*  MG 1.9  --   --   --   --   --   --   PHOS 4.4  --   --   --   --   --   --    < > = values in this interval not displayed.   GFR: Estimated Creatinine Clearance: 98.9 mL/min (by C-G formula based on SCr of 1.18 mg/dL). Liver Function Tests: Recent Labs  Lab 06/20/19 0935 06/21/19 0333 06/22/19 0327 06/23/19 1009 06/24/19 0550  AST 57* 78* 64* 70* 48*  ALT 243* 241* 217* 231* 214*  ALKPHOS 331* 340* 319* 361* 373*  BILITOT 0.5 0.6 0.4 0.4 0.5  PROT 5.8* 5.9* 5.6* 5.9* 6.4*  ALBUMIN 2.8* 2.8* 2.6* 2.8* 3.1*   No results for input(s): LIPASE, AMYLASE in the last 168 hours. No results for input(s): AMMONIA in the last 168 hours. Coagulation Profile: No results for input(s): INR, PROTIME in the last 168 hours. Cardiac Enzymes: No results for input(s): CKTOTAL, CKMB, CKMBINDEX, TROPONINI in the last 168 hours. BNP (last 3 results) No  results for input(s): PROBNP in the last 8760 hours. HbA1C: No results for input(s): HGBA1C in the last 72 hours. CBG: No results for input(s): GLUCAP in the last 168 hours. Lipid Profile: No results for input(s): CHOL, HDL, LDLCALC, TRIG, CHOLHDL, LDLDIRECT in the last 72 hours. Thyroid Function Tests: Recent Labs    06/23/19 1009  TSH 0.744  T4TOTAL 5.8   Anemia Panel: No results for input(s): VITAMINB12, FOLATE, FERRITIN, TIBC, IRON, RETICCTPCT in the last 72 hours. Urine analysis:    Component Value Date/Time   COLORURINE YELLOW 06/02/2019 1838   APPEARANCEUR CLEAR 06/02/2019 1838   LABSPEC 1.016 06/02/2019 1838  PHURINE 5.0 06/02/2019 1838   GLUCOSEU NEGATIVE 06/02/2019 1838   HGBUR LARGE (A) 06/02/2019 1838   BILIRUBINUR NEGATIVE 06/02/2019 1838   KETONESUR NEGATIVE 06/02/2019 1838   PROTEINUR NEGATIVE 06/02/2019 1838   UROBILINOGEN 1.0 05/02/2014 1253   NITRITE NEGATIVE 06/02/2019 1838   LEUKOCYTESUR NEGATIVE 06/02/2019 1838   Sepsis Labs: @LABRCNTIP (procalcitonin:4,lacticidven:4)  ) Recent Results (from the past 240 hour(s))  SARS Coronavirus 2 by RT PCR (hospital order, performed in [facility] hospital lab) Nasopharyngeal Nasopharyngeal Swab     Status: None   Collection Time: 06/17/19  5:00 PM   Specimen: Nasopharyngeal Swab  Result Value Ref Range Status   SARS Coronavirus 2 NEGATIVE NEGATIVE Final    Comment: (NOTE) SARS-CoV-2 target nucleic acids are NOT DETECTED. The SARS-CoV-2 RNA is generally detectable in upper and lower respiratory specimens during the acute phase of infection. The lowest concentration of SARS-CoV-2 viral copies this assay can detect is 250 copies / mL. A negative result does not preclude SARS-CoV-2 infection and should not be used as the sole basis for treatment or other patient management decisions.  A negative result may occur with improper specimen collection / handling, submission of specimen other than nasopharyngeal swab,  presence of viral mutation(s) within the areas targeted by this assay, and inadequate number of viral copies (<250 copies / mL). A negative result must be combined with clinical observations, patient history, and epidemiological information. Fact Sheet for Patients:   StrictlyIdeas.no Fact Sheet for Healthcare Providers: BankingDealers.co.za This test is not yet approved or cleared  by the [place] FDA and has been authorized for detection and/or diagnosis of SARS-CoV-2 by FDA under an Emergency Use Authorization (EUA).  This EUA will remain in effect (meaning this test can be used) for the duration of the COVID-19 declaration under Section 564(b)(1) of the Act, 21 U.S.C. section 360bbb-3(b)(1), unless the authorization is terminated or revoked sooner. Performed at [facility], [place] [address] [place],  [address]       Studies: No results found.  Scheduled Meds: . Chlorhexidine Gluconate Cloth  6 each Topical Daily  . dexamethasone (DECADRON) injection  4 mg Intravenous Q12H  . fentaNYL  1 patch Transdermal Q72H  . folic acid  1 mg Oral Daily  . metoprolol tartrate  12.5 mg Oral BID  . pantoprazole  40 mg Oral BID  . polyethylene glycol  17 g Oral Daily  . rivaroxaban  15 mg Oral BID WC   Followed by  . [START ON 07/08/2019] rivaroxaban  20 mg Oral Q supper  . senna  2 tablet Oral BID  . sodium chloride flush  10-40 mL Intracatheter Q12H    Continuous Infusions: . sodium chloride 75 mL/hr at 06/23/19 1749     LOS: 7 days     Alma Friendly, MD [facility]  If 7PM-7AM, please contact night-coverage [email or web address] 06/24/2019, 1:29 PM

## 2019-06-24 NOTE — Progress Notes (Signed)
Physical Therapy Treatment Patient Details Name: William Ali MRN: [id number] DOB: 1986-01-07 Today's Date: 06/24/2019    History of Present Illness William Ali is a 34 y.o. 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  Patient was seen in the ED on 05/10/2019 with a headache and left pelvic pain.  He subsequently underwent CT of the C/A/P and MRI of the brain and was found to have widely metastatic disease with a right lung mass, lytic lesions throughout the thoracic and lumbar spine and pelvis, and numerous liver lesions.  Patient subsequently underwent liver biopsy on 05/16/2019 with pathology showing poorly differentiated adenocarcinoma.    PT Comments    Pt did well with gait today with HR up to 131. Will need continued strengthening and mobility training. He may not need HH or RW at time of d/c, but will continue to assess his progress and his medical status.    Follow Up Recommendations  Home health PT;Supervision for mobility/OOB     Equipment Recommendations  Rolling walker with 5" wheels;3in1 (PT)    Recommendations for Other Services       Precautions / Restrictions Precautions Precautions: Fall Restrictions Weight Bearing Restrictions: No    Mobility  Bed Mobility Overal bed mobility: Needs Assistance Bed Mobility: Sidelying to Sit   Sidelying to sit: Supervision       General bed mobility comments: Pt moving quickly  Transfers Overall transfer level: Needs assistance Equipment used: None Transfers: Sit to/from Stand Sit to Stand: Supervision         General transfer comment: S only for safety  Ambulation/Gait Ambulation/Gait assistance: Supervision;Min guard Gait Distance (Feet): 250 Feet Assistive device: IV Pole Gait Pattern/deviations: Step-through pattern;Decreased stride length;Decreased weight shift to left Gait velocity: decr   General Gait Details: Pt initally  declined use of IV pole for gait, but ended up using it for gait.  no LOB and happy to be out of his room.  HR 131 after gait with o2 98% on room air.   Stairs             Wheelchair Mobility    Modified Rankin (Stroke Patients Only)       Balance   Sitting-balance support: Feet supported;No upper extremity supported Sitting balance-Leahy Scale: Good     Standing balance support: During functional activity Standing balance-Leahy Scale: Good                              Cognition Arousal/Alertness: Awake/alert Behavior During Therapy: WFL for tasks assessed/performed Overall Cognitive Status: Within Functional Limits for tasks assessed                                        Exercises      General Comments General comments (skin integrity, edema, etc.): Pt instructed in general LE ther ex for hip, knee, ankle ROM to keep from getting stiff.      Pertinent Vitals/Pain Pain Assessment: 0-10 Pain Score: 4  Pain Location: back and stomach Pain Descriptors / Indicators: Discomfort Pain Intervention(s): Monitored during session;Heat applied    Home Living                      Prior Function  PT Goals (current goals can now be found in the care plan section) Acute Rehab PT Goals Patient Stated Goal: walk, strengthen LLE Progress towards PT goals: Progressing toward goals    Frequency    Min 3X/week      PT Plan Current plan remains appropriate    Co-evaluation              AM-PAC PT "6 Clicks" Mobility   Outcome Measure  Help needed turning from your back to your side while in a flat bed without using bedrails?: None Help needed moving from lying on your back to sitting on the side of a flat bed without using bedrails?: None Help needed moving to and from a bed to a chair (including a wheelchair)?: A Little Help needed standing up from a chair using your arms (e.g., wheelchair or bedside chair)?:  A Little Help needed to walk in hospital room?: A Little Help needed climbing 3-5 steps with a railing? : A Lot 6 Click Score: 19    End of Session Equipment Utilized During Treatment: Gait belt Activity Tolerance: Patient tolerated treatment well Patient left: in bed;with call bell/phone within reach;with family/visitor present Nurse Communication: Mobility status PT Visit Diagnosis: Unsteadiness on feet (R26.81);Other abnormalities of gait and mobility (R26.89)     Time: 3570-1779 PT Time Calculation (min) (ACUTE ONLY): 17 min  Charges:  $Gait Training: 8-22 mins                     William Ali, [place] Pager [phone] 06/24/2019    Galen [place] 06/24/2019, 2:49 PM

## 2019-06-24 NOTE — Progress Notes (Signed)
Palliative Care  Follow-up Note  William Ali is s/p initiation of chemotherapy inpatient for metastatic lung cancer. Continues to require IV hydromorphone for bone pain. He had some relief with PRN Toradol- discussed resuming this as a PRN as long as his renal function is good and no GI issues or bleeding issues. Suspect some of his abdominal pain is from capsular pain from liver mets- encouraged decadron. He has no other complaints except for a HA and appears to be tolerating his treatment well. He hopes his appetite improves. Provided encouragement and support. Will see him Monday unless there are additional pain or symptom needs. PC number placed on his board if there are additional needs.  Time: 15 min Greater than 50%  of this time was spent counseling and coordinating care related to the above assessment and plan.  [name], DO Palliative Medicine

## 2019-06-25 LAB — COMPREHENSIVE METABOLIC PANEL
ALT: 202 U/L — ABNORMAL HIGH (ref 0–44)
AST: 62 U/L — ABNORMAL HIGH (ref 15–41)
Albumin: 2.9 g/dL — ABNORMAL LOW (ref 3.5–5.0)
Alkaline Phosphatase: 362 U/L — ABNORMAL HIGH (ref 38–126)
Anion gap: 8 (ref 5–15)
BUN: 28 mg/dL — ABNORMAL HIGH (ref 6–20)
CO2: 25 mmol/L (ref 22–32)
Calcium: 8.5 mg/dL — ABNORMAL LOW (ref 8.9–10.3)
Chloride: 108 mmol/L (ref 98–111)
Creatinine, Ser: 1.15 mg/dL (ref 0.61–1.24)
GFR calc Af Amer: >60 mL/min (ref >60)
GFR calc non Af Amer: >60 mL/min (ref >60)
Glucose, Bld: 140 mg/dL — ABNORMAL HIGH (ref 70–99)
Potassium: 4.4 mmol/L (ref 3.5–5.1)
Sodium: 141 mmol/L (ref 135–145)
Total Bilirubin: 0.4 mg/dL (ref 0.3–1.2)
Total Protein: 6.1 g/dL — ABNORMAL LOW (ref 6.5–8.1)

## 2019-06-25 LAB — CBC WITH DIFFERENTIAL/PLATELET
Abs Immature Granulocytes: 0.13 10*3/uL — ABNORMAL HIGH (ref 0.00–0.07)
Basophils Absolute: 0 10*3/uL (ref 0.0–0.1)
Basophils Relative: 0 %
Eosinophils Absolute: 0 10*3/uL (ref 0.0–0.5)
Eosinophils Relative: 0 %
HCT: 33.4 % — ABNORMAL LOW (ref 39.0–52.0)
Hemoglobin: 10.2 g/dL — ABNORMAL LOW (ref 13.0–17.0)
Immature Granulocytes: 2 %
Lymphocytes Relative: 1 %
Lymphs Abs: 0.1 10*3/uL — ABNORMAL LOW (ref 0.7–4.0)
MCH: 26.8 pg (ref 26.0–34.0)
MCHC: 30.5 g/dL (ref 30.0–36.0)
MCV: 87.9 fL (ref 80.0–100.0)
Monocytes Absolute: 0.4 10*3/uL (ref 0.1–1.0)
Monocytes Relative: 5 %
Neutro Abs: 7.9 10*3/uL — ABNORMAL HIGH (ref 1.7–7.7)
Neutrophils Relative %: 92 %
Platelets: 144 10*3/uL — ABNORMAL LOW (ref 150–400)
RBC: 3.8 MIL/uL — ABNORMAL LOW (ref 4.22–5.81)
RDW: 17.5 % — ABNORMAL HIGH (ref 11.5–15.5)
WBC: 8.6 10*3/uL (ref 4.0–10.5)
nRBC: 0 % (ref 0.0–0.2)

## 2019-06-25 NOTE — Progress Notes (Signed)
PROGRESS NOTE  William Ali CHE:[id number] DOB: Jun 08, 1985 DOA: 06/17/2019 PCP: Patient, No Pcp Per  HPI/Recap of past 24 hours: Zavien J Carsonis a 34 y.o.maleveryunfortunate young manwith medical history significant ofmetastatic lung cancer who presents to the emergency department with complaints of headache,chest pain,back pain, abdominal pain. He was bothered by pain in upper back and chest. He was also having abdominal discomfort and severe headache. There is no report of fever, chills, nausea, vomiting, dysuria or shortness of breath.He has been currently treated with radiation therapy and is starting on chemotherapy soon. He was diagnosed with DVT on 06/04/2019 and was diagnosed with PE on his last admission. He was treated with heparin drip for his PE and was discharged on Xarelto. He also recently had a Chemo-Port placement on 06/12/2019. On presentation, he was in sinus tachycardia, quite hypertensive most likely secondary to pain. X-rays show continued extensive, expanding tumor burden. Patient was admitted for pain management, supportive care and possibly starting urgent radiation therapy for his cord compression on the T-spine.He has been started on Decadron. Palliative care consultation. Oncology and radiation oncology consulted.  PMT consulted for cancer related pain management.    Today, patient reports no new complaints, still with abdominal pain, headache and some back pain not controlled with pain medications.  Patient denies any new complaints.    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 His oncologist Dr. Lorenso Courier following Management directed by oncology and palliative care Chemo-Port in  place on right chest. Cancer related pain management per PMT Oncology on board, received his first dose of chemo (pemetrexed and carboplatin) on 06/23/2019 without any issues Rad-onc also on board  T-spine cord compression Continue IV Decadron 4 mg twice daily Continue pain management as recommended by PMT  DVT/PE, recently diagnosed Continue oral anticoagulation, Xarelto  Intractable cough Continue Tessalon Perles  Transaminitis Likely from mets to liver Daily CMP  Anemia of chronic disease in the setting of malignancy/thrombocytopenia Hemoglobin stable with no overt bleeding Daily CBC  Headache/left eye vision loss Primary adenocarcinoma the right with mets to the brain and left retina  Goals of care Palliative care team following Currently a full code    DVT prophylaxis: [id number]  Code Status: Full code  Family Communication:  Discussed with wife at bedside on 06/23/2019    Consultants:   Palliative care  Neurosurgery  Radiation Oncology  Medical Oncology  Procedures:  None     Status is: Inpatient   Dispo: The patient is from: Home.              Anticipated d/c is to: Home in the next 24-48 hours               Anticipated d/c date is: TBD               Patient currently ongoing oncology treatment and pain management with IV pain medications.    Objective: Vitals:   06/24/19 1543 06/24/19 2053 06/25/19 0604 06/25/19 1320  BP: (!) 147/88 (!) 154/87 (!) 148/86 (!) 143/82  Pulse: 88 84 79 80  Resp: 16 16 16 20   Temp: 97.8 F (36.6 C) 97.8 F (36.6 C) 98.1 F (36.7 C) 97.7 F (36.5 C)  TempSrc: Oral Oral  Oral Oral  SpO2: 100% 98% 98% 98%  Weight:      Height:        Intake/Output Summary (Last 24 hours) at 06/25/2019 1336 Last data filed at 06/25/2019 9147 Gross per 24 hour  Intake 1650.49 ml  Output 2300 ml  Net -649.51 ml   Filed Weights   06/17/19 1821  Weight: 99 kg    Exam:  General: NAD   Cardiovascular: S1, S2  present  Respiratory: CTAB  Abdomen: Soft, nontender, nondistended, bowel sounds present  Musculoskeletal: No bilateral pedal edema noted  Skin: Normal  Psychiatry: Normal mood   Data Reviewed: CBC: Recent Labs  Lab 06/21/19 0333 06/22/19 0327 06/23/19 1009 06/24/19 0550 06/25/19 0546  WBC 6.9 7.7 9.1 10.8* 8.6  NEUTROABS 6.2 6.9 8.2* 9.6* 7.9*  HGB 10.4* 10.1* 10.4* 10.7* 10.2*  HCT 32.6* 32.5* 33.4* 33.9* 33.4*  MCV 87.9 88.1 88.1 87.1 87.9  PLT 120* 125* 164 153 829*   Basic Metabolic Panel: Recent Labs  Lab 06/19/19 0820 06/20/19 0935 06/21/19 0333 06/22/19 0327 06/23/19 1009 06/24/19 0550 06/25/19 0546  NA 142   < > 142 143 140 137 141  K 4.0   < > 4.4 4.3 4.1 4.2 4.4  CL 107   < > 107 106 107 104 108  CO2 25   < > 26 23 23 25 25   GLUCOSE 133*   < > 142* 163* 142* 168* 140*  BUN 22*   < > 21* 22* 24* 27* 28*  CREATININE 1.19   < > 1.19 1.15 1.16 1.18 1.15  CALCIUM 8.5*   < > 8.5* 8.1* 8.5* 8.6* 8.5*  MG 1.9  --   --   --   --   --   --   PHOS 4.4  --   --   --   --   --   --    < > = values in this interval not displayed.   GFR: Estimated Creatinine Clearance: 101.5 mL/min (by C-G formula based on SCr of 1.15 mg/dL). Liver Function Tests: Recent Labs  Lab 06/21/19 0333 06/22/19 0327 06/23/19 1009 06/24/19 0550 06/25/19 0546  AST 78* 64* 70* 48* 62*  ALT 241* 217* 231* 214* 202*  ALKPHOS 340* 319* 361* 373* 362*  BILITOT 0.6 0.4 0.4 0.5 0.4  PROT 5.9* 5.6* 5.9* 6.4* 6.1*  ALBUMIN 2.8* 2.6* 2.8* 3.1* 2.9*   No results for input(s): LIPASE, AMYLASE in the last 168 hours. No results for input(s): AMMONIA in the last 168 hours. Coagulation Profile: No results for input(s): INR, PROTIME in the last 168 hours. Cardiac Enzymes: No results for input(s): CKTOTAL, CKMB, CKMBINDEX, TROPONINI in the last 168 hours. BNP (last 3 results) No results for input(s): PROBNP in the last 8760 hours. HbA1C: No results for input(s): HGBA1C in the last 72  hours. CBG: No results for input(s): GLUCAP in the last 168 hours. Lipid Profile: No results for input(s): CHOL, HDL, LDLCALC, TRIG, CHOLHDL, LDLDIRECT in the last 72 hours. Thyroid Function Tests: Recent Labs    06/23/19 1009  TSH 0.744  T4TOTAL 5.8   Anemia Panel: No results for input(s): VITAMINB12, FOLATE, FERRITIN, TIBC, IRON, RETICCTPCT in the last 72 hours. Urine analysis:    Component Value Date/Time   COLORURINE YELLOW 06/02/2019 1838   APPEARANCEUR CLEAR 06/02/2019 1838   LABSPEC 1.016 06/02/2019 1838   PHURINE 5.0 06/02/2019 1838   GLUCOSEU NEGATIVE 06/02/2019 1838   HGBUR LARGE (A) 06/02/2019 1838  BILIRUBINUR NEGATIVE 06/02/2019 1838   KETONESUR NEGATIVE 06/02/2019 1838   PROTEINUR NEGATIVE 06/02/2019 1838   UROBILINOGEN 1.0 05/02/2014 1253   NITRITE NEGATIVE 06/02/2019 1838   LEUKOCYTESUR NEGATIVE 06/02/2019 1838   Sepsis Labs: @LABRCNTIP (procalcitonin:4,lacticidven:4)  ) Recent Results (from the past 240 hour(s))  SARS Coronavirus 2 by RT PCR (hospital order, performed in [facility] hospital lab) Nasopharyngeal Nasopharyngeal Swab     Status: None   Collection Time: 06/17/19  5:00 PM   Specimen: Nasopharyngeal Swab  Result Value Ref Range Status   SARS Coronavirus 2 NEGATIVE NEGATIVE Final    Comment: (NOTE) SARS-CoV-2 target nucleic acids are NOT DETECTED. The SARS-CoV-2 RNA is generally detectable in upper and lower respiratory specimens during the acute phase of infection. The lowest concentration of SARS-CoV-2 viral copies this assay can detect is 250 copies / mL. A negative result does not preclude SARS-CoV-2 infection and should not be used as the sole basis for treatment or other patient management decisions.  A negative result may occur with improper specimen collection / handling, submission of specimen other than nasopharyngeal swab, presence of viral mutation(s) within the areas targeted by this assay, and inadequate number of viral  copies (<250 copies / mL). A negative result must be combined with clinical observations, patient history, and epidemiological information. Fact Sheet for Patients:   StrictlyIdeas.no Fact Sheet for Healthcare Providers: BankingDealers.co.za This test is not yet approved or cleared  by the [place] FDA and has been authorized for detection and/or diagnosis of SARS-CoV-2 by FDA under an Emergency Use Authorization (EUA).  This EUA will remain in effect (meaning this test can be used) for the duration of the COVID-19 declaration under Section 564(b)(1) of the Act, 21 U.S.C. section 360bbb-3(b)(1), unless the authorization is terminated or revoked sooner. Performed at [facility], [place] [address] [place], [place] [address]       Studies: No results found.  Scheduled Meds: . Chlorhexidine Gluconate Cloth  6 each Topical Daily  . dexamethasone (DECADRON) injection  4 mg Intravenous Q12H  . fentaNYL  1 patch Transdermal Q72H  . folic acid  1 mg Oral Daily  . metoprolol tartrate  12.5 mg Oral BID  . pantoprazole  40 mg Oral BID  . polyethylene glycol  17 g Oral Daily  . rivaroxaban  15 mg Oral BID WC   Followed by  . [START ON 07/08/2019] rivaroxaban  20 mg Oral Q supper  . senna  2 tablet Oral BID  . sodium chloride flush  10-40 mL Intracatheter Q12H    Continuous Infusions: . sodium chloride 75 mL/hr at 06/24/19 1614     LOS: 8 days     Alma Friendly, MD [facility]  If 7PM-7AM, please contact night-coverage [email or web address] 06/25/2019, 1:36 PM

## 2019-06-26 LAB — COMPREHENSIVE METABOLIC PANEL
ALT: 179 U/L — ABNORMAL HIGH (ref 0–44)
AST: 51 U/L — ABNORMAL HIGH (ref 15–41)
Albumin: 2.7 g/dL — ABNORMAL LOW (ref 3.5–5.0)
Alkaline Phosphatase: 358 U/L — ABNORMAL HIGH (ref 38–126)
Anion gap: 11 (ref 5–15)
BUN: 25 mg/dL — ABNORMAL HIGH (ref 6–20)
CO2: 24 mmol/L (ref 22–32)
Calcium: 8.5 mg/dL — ABNORMAL LOW (ref 8.9–10.3)
Chloride: 104 mmol/L (ref 98–111)
Creatinine, Ser: 1.14 mg/dL (ref 0.61–1.24)
GFR calc Af Amer: >60 mL/min (ref >60)
GFR calc non Af Amer: >60 mL/min (ref >60)
Glucose, Bld: 178 mg/dL — ABNORMAL HIGH (ref 70–99)
Potassium: 4.6 mmol/L (ref 3.5–5.1)
Sodium: 139 mmol/L (ref 135–145)
Total Bilirubin: 0.6 mg/dL (ref 0.3–1.2)
Total Protein: 5.8 g/dL — ABNORMAL LOW (ref 6.5–8.1)

## 2019-06-26 LAB — CBC WITH DIFFERENTIAL/PLATELET
Abs Immature Granulocytes: 0.06 10*3/uL (ref 0.00–0.07)
Basophils Absolute: 0 10*3/uL (ref 0.0–0.1)
Basophils Relative: 0 %
Eosinophils Absolute: 0 10*3/uL (ref 0.0–0.5)
Eosinophils Relative: 0 %
HCT: 32 % — ABNORMAL LOW (ref 39.0–52.0)
Hemoglobin: 10.3 g/dL — ABNORMAL LOW (ref 13.0–17.0)
Immature Granulocytes: 1 %
Lymphocytes Relative: 1 %
Lymphs Abs: 0.1 10*3/uL — ABNORMAL LOW (ref 0.7–4.0)
MCH: 28 pg (ref 26.0–34.0)
MCHC: 32.2 g/dL (ref 30.0–36.0)
MCV: 87 fL (ref 80.0–100.0)
Monocytes Absolute: 0.2 10*3/uL (ref 0.1–1.0)
Monocytes Relative: 3 %
Neutro Abs: 8 10*3/uL — ABNORMAL HIGH (ref 1.7–7.7)
Neutrophils Relative %: 95 %
Platelets: 121 10*3/uL — ABNORMAL LOW (ref 150–400)
RBC: 3.68 MIL/uL — ABNORMAL LOW (ref 4.22–5.81)
RDW: 17.2 % — ABNORMAL HIGH (ref 11.5–15.5)
WBC: 8.3 10*3/uL (ref 4.0–10.5)
nRBC: 0 % (ref 0.0–0.2)

## 2019-06-26 MED ORDER — AMPHETAMINE-DEXTROAMPHETAMINE 10 MG PO TABS
10.0000 mg | ORAL_TABLET | Freq: Every day | ORAL | Status: DC
  Administered 2019-06-26: 10 mg via ORAL
  Filled 2019-06-26 (×3): qty 1

## 2019-06-26 MED ORDER — HYDROMORPHONE HCL 4 MG PO TABS
4.0000 mg | ORAL_TABLET | ORAL | Status: DC | PRN
  Administered 2019-06-27 – 2019-06-28 (×2): 4 mg via ORAL
  Filled 2019-06-26 (×4): qty 1

## 2019-06-26 MED ORDER — METOPROLOL TARTRATE 25 MG PO TABS
25.0000 mg | ORAL_TABLET | Freq: Two times a day (BID) | ORAL | Status: DC
  Administered 2019-06-26: 25 mg via ORAL
  Administered 2019-06-26: 12.5 mg via ORAL
  Administered 2019-06-27 – 2019-06-28 (×3): 25 mg via ORAL
  Filled 2019-06-26 (×4): qty 1

## 2019-06-26 MED ORDER — ONDANSETRON HCL 4 MG PO TABS
4.0000 mg | ORAL_TABLET | ORAL | Status: DC
  Administered 2019-06-26 – 2019-06-28 (×11): 4 mg via ORAL
  Filled 2019-06-26 (×11): qty 1

## 2019-06-26 MED ORDER — CELECOXIB 200 MG PO CAPS
200.0000 mg | ORAL_CAPSULE | Freq: Every day | ORAL | Status: DC
  Administered 2019-06-27 – 2019-06-28 (×2): 200 mg via ORAL
  Filled 2019-06-26 (×2): qty 1

## 2019-06-26 MED ORDER — FENTANYL 100 MCG/HR TD PT72
1.0000 | MEDICATED_PATCH | TRANSDERMAL | Status: DC
  Administered 2019-06-26: 1 via TRANSDERMAL
  Filled 2019-06-26: qty 1

## 2019-06-26 NOTE — Progress Notes (Signed)
PROGRESS NOTE  William Ali WCB:[id number] DOB: 11-Sep-1985 DOA: 06/17/2019 PCP: Patient, No Pcp Per  HPI/Recap of past 24 hours: William J Carsonis a 34 y.o.maleveryunfortunate young manwith medical history significant ofmetastatic lung cancer who presents to the emergency department with complaints of headache,chest pain,back pain, abdominal pain. He was bothered by pain in upper back and chest. He was also having abdominal discomfort and severe headache. There is no report of fever, chills, nausea, vomiting, dysuria or shortness of breath.He has been currently treated with radiation therapy and is starting on chemotherapy soon. He was diagnosed with DVT on 06/04/2019 and was diagnosed with PE on his last admission. He was treated with heparin drip for his PE and was discharged on Xarelto. He also recently had a Chemo-Port placement on 06/12/2019. On presentation, he was in sinus tachycardia, quite hypertensive most likely secondary to pain. X-rays show continued extensive, expanding tumor burden. Patient was admitted for pain management, supportive care and possibly starting urgent radiation therapy for his cord compression on the T-spine.He has been started on Decadron. Palliative care consultation. Oncology and radiation oncology consulted.  PMT consulted for cancer related pain management.    Patient still complaining of abdominal pain, headache, still requiring significant amounts of IV narcotics.  Denies any chest pain, shortness of breath, fever/chills.    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 Management directed by oncology and palliative care Chemo-Port in place on right chest. Cancer related  pain management per PMT Oncology on board, received his first dose of chemo (pemetrexed and carboplatin) on 06/23/2019 without any issues Rad-onc also on board  T-spine cord compression Continue IV Decadron 4 mg twice daily Continue pain management as recommended by PMT  DVT/PE, recently diagnosed Continue oral anticoagulation, Xarelto  Intractable cough Improved Continue Tessalon Perles  Transaminitis Likely from mets to liver Daily CMP  Anemia of chronic disease in the setting of malignancy/thrombocytopenia Hemoglobin stable with no overt bleeding Daily CBC  Headache/left eye vision loss Primary adenocarcinoma the right with mets to the brain and left retina  Goals of care Palliative care team following Currently a full code    DVT prophylaxis: [id number]  Code Status: Full code  Family Communication:  Discussed with wife at bedside on 06/26/2019    Consultants:   Palliative care  Neurosurgery  Radiation Oncology  Medical Oncology  Procedures:  None     Status is: Inpatient   Dispo: The patient is from: Home.              Anticipated d/c is to: Home in the next 24-48 hours               Anticipated d/c date is: TBD               Patient currently ongoing oncology treatment and pain management with IV pain medications.    Objective: Vitals:   06/25/19 2046 06/26/19 0558 06/26/19 0945 06/26/19 1249  BP: (!) 160/87 (!) 156/84 (!) 149/82 (!) 146/80  Pulse: 91 81 88 86  Resp: 16 16  18   Temp: 97.7 F (36.5 C) 97.7 F (36.5 C)  97.9 F (36.6 C)  TempSrc: Oral Oral  Oral  SpO2: 100% 100%  97%  Weight:  Height:        Intake/Output Summary (Last 24 hours) at 06/26/2019 1322 Last data filed at 06/26/2019 0700 Gross per 24 hour  Intake --  Output 1650 ml  Net -1650 ml   Filed Weights   06/17/19 1821  Weight: 99 kg    Exam:  General: NAD, left eye visual loss  Cardiovascular: S1, S2 present  Respiratory: CTAB  Abdomen:  Soft, +tender, nondistended, bowel sounds present  Musculoskeletal: No bilateral pedal edema noted  Skin: Normal  Psychiatry: Normal mood   Data Reviewed: CBC: Recent Labs  Lab 06/22/19 0327 06/23/19 1009 06/24/19 0550 06/25/19 0546 06/26/19 0334  WBC 7.7 9.1 10.8* 8.6 8.3  NEUTROABS 6.9 8.2* 9.6* 7.9* 8.0*  HGB 10.1* 10.4* 10.7* 10.2* 10.3*  HCT 32.5* 33.4* 33.9* 33.4* 32.0*  MCV 88.1 88.1 87.1 87.9 87.0  PLT 125* 164 153 144* 038*   Basic Metabolic Panel: Recent Labs  Lab 06/22/19 0327 06/23/19 1009 06/24/19 0550 06/25/19 0546 06/26/19 0334  NA 143 140 137 141 139  K 4.3 4.1 4.2 4.4 4.6  CL 106 107 104 108 104  CO2 23 23 25 25 24   GLUCOSE 163* 142* 168* 140* 178*  BUN 22* 24* 27* 28* 25*  CREATININE 1.15 1.16 1.18 1.15 1.14  CALCIUM 8.1* 8.5* 8.6* 8.5* 8.5*   GFR: Estimated Creatinine Clearance: 102.4 mL/min (by C-G formula based on SCr of 1.14 mg/dL). Liver Function Tests: Recent Labs  Lab 06/22/19 0327 06/23/19 1009 06/24/19 0550 06/25/19 0546 06/26/19 0334  AST 64* 70* 48* 62* 51*  ALT 217* 231* 214* 202* 179*  ALKPHOS 319* 361* 373* 362* 358*  BILITOT 0.4 0.4 0.5 0.4 0.6  PROT 5.6* 5.9* 6.4* 6.1* 5.8*  ALBUMIN 2.6* 2.8* 3.1* 2.9* 2.7*   No results for input(s): LIPASE, AMYLASE in the last 168 hours. No results for input(s): AMMONIA in the last 168 hours. Coagulation Profile: No results for input(s): INR, PROTIME in the last 168 hours. Cardiac Enzymes: No results for input(s): CKTOTAL, CKMB, CKMBINDEX, TROPONINI in the last 168 hours. BNP (last 3 results) No results for input(s): PROBNP in the last 8760 hours. HbA1C: No results for input(s): HGBA1C in the last 72 hours. CBG: No results for input(s): GLUCAP in the last 168 hours. Lipid Profile: No results for input(s): CHOL, HDL, LDLCALC, TRIG, CHOLHDL, LDLDIRECT in the last 72 hours. Thyroid Function Tests: No results for input(s): TSH, T4TOTAL, FREET4, T3FREE, THYROIDAB in the last 72  hours. Anemia Panel: No results for input(s): VITAMINB12, FOLATE, FERRITIN, TIBC, IRON, RETICCTPCT in the last 72 hours. Urine analysis:    Component Value Date/Time   COLORURINE YELLOW 06/02/2019 1838   APPEARANCEUR CLEAR 06/02/2019 1838   LABSPEC 1.016 06/02/2019 1838   PHURINE 5.0 06/02/2019 1838   GLUCOSEU NEGATIVE 06/02/2019 1838   HGBUR LARGE (A) 06/02/2019 1838   BILIRUBINUR NEGATIVE 06/02/2019 1838   KETONESUR NEGATIVE 06/02/2019 1838   PROTEINUR NEGATIVE 06/02/2019 1838   UROBILINOGEN 1.0 05/02/2014 1253   NITRITE NEGATIVE 06/02/2019 1838   LEUKOCYTESUR NEGATIVE 06/02/2019 1838   Sepsis Labs: @LABRCNTIP (procalcitonin:4,lacticidven:4)  ) Recent Results (from the past 240 hour(s))  SARS Coronavirus 2 by RT PCR (hospital order, performed in [place] hospital lab) Nasopharyngeal Nasopharyngeal Swab     Status: None   Collection Time: 06/17/19  5:00 PM   Specimen: Nasopharyngeal Swab  Result Value Ref Range Status   SARS Coronavirus 2 NEGATIVE NEGATIVE Final    Comment: (NOTE) SARS-CoV-2 target nucleic acids are NOT DETECTED.  The SARS-CoV-2 RNA is generally detectable in upper and lower respiratory specimens during the acute phase of infection. The lowest concentration of SARS-CoV-2 viral copies this assay can detect is 250 copies / mL. A negative result does not preclude SARS-CoV-2 infection and should not be used as the sole basis for treatment or other patient management decisions.  A negative result may occur with improper specimen collection / handling, submission of specimen other than nasopharyngeal swab, presence of viral mutation(s) within the areas targeted by this assay, and inadequate number of viral copies (<250 copies / mL). A negative result must be combined with clinical observations, patient history, and epidemiological information. Fact Sheet for Patients:   StrictlyIdeas.no Fact Sheet for Healthcare  Providers: BankingDealers.co.za This test is not yet approved or cleared  by the [place] FDA and has been authorized for detection and/or diagnosis of SARS-CoV-2 by FDA under an Emergency Use Authorization (EUA).  This EUA will remain in effect (meaning this test can be used) for the duration of the COVID-19 declaration under Section 564(b)(1) of the Act, 21 U.S.C. section 360bbb-3(b)(1), unless the authorization is terminated or revoked sooner. Performed at [facility], [place] [address] [place], [place] [address]       Studies: No results found.  Scheduled Meds: . amphetamine-dextroamphetamine  10 mg Oral Daily  . [START ON 06/27/2019] celecoxib  200 mg Oral Daily  . Chlorhexidine Gluconate Cloth  6 each Topical Daily  . dexamethasone (DECADRON) injection  4 mg Intravenous Q12H  . fentaNYL  1 patch Transdermal Q72H  . folic acid  1 mg Oral Daily  . metoprolol tartrate  25 mg Oral BID  . ondansetron  4 mg Oral Q4H  . pantoprazole  40 mg Oral BID  . polyethylene glycol  17 g Oral Daily  . rivaroxaban  15 mg Oral BID WC   Followed by  . [START ON 07/08/2019] rivaroxaban  20 mg Oral Q supper  . senna  2 tablet Oral BID  . sodium chloride flush  10-40 mL Intracatheter Q12H    Continuous Infusions: . sodium chloride 75 mL/hr at 06/26/19 0356     LOS: 9 days     Alma Friendly, MD [facility]  If 7PM-7AM, please contact night-coverage [email or web address] 06/26/2019, 1:22 PM

## 2019-06-26 NOTE — Progress Notes (Signed)
Physical Therapy Treatment Patient Details Name: William Ali MRN: [id number] DOB: 12/20/1985 Today's Date: 06/26/2019    History of Present Illness William Ali is a 34 y.o. 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  Patient was seen in the ED on 05/10/2019 with a headache and left pelvic pain.  He subsequently underwent CT of the C/A/P and MRI of the brain and was found to have widely metastatic disease with a right lung mass, lytic lesions throughout the thoracic and lumbar spine and pelvis, and numerous liver lesions.  Patient subsequently underwent liver biopsy on 05/16/2019 with pathology showing poorly differentiated adenocarcinoma.    PT Comments    Patient making good progress and more agreeable to participate in therapy today. He required supervision for transfers and gait with no device. He remains mildly unsteady with slight drift to LE and decreased foot clearance on Lt LE occasionally but no overt LOB noted. Pt educated on exercises for functional LE strengthening with repeated sit to stand with mini-squat for muscle endurance. Pt's wife and pt both verbalized understanding of safe chair set up to complete exercise at home. He will continue to benefit from skilled PT interventions to address impairments and progress activity tolerance. Encouraged pt to mobilize with RN/NT team while here to improve activity tolerance. Acute PT will follow and progress as able.    Follow Up Recommendations  Home health PT;Supervision for mobility/OOB     Equipment Recommendations  3in1 (PT)    Recommendations for Other Services       Precautions / Restrictions Precautions Precautions: Fall Restrictions Weight Bearing Restrictions: No    Mobility  Bed Mobility        General bed mobility comments: pt sitting EOB at start of session. ended in recliner.   Transfers Overall transfer level: Needs  assistance Equipment used: None Transfers: Sit to/from [facility] Sit to Stand: Supervision Stand pivot transfers: Supervision       General transfer comment: Supervision for safety. pt steady with rising and with side steps to move bed to chair.  Ambulation/Gait Ambulation/Gait assistance: Supervision;Min guard Gait Distance (Feet): 360 Feet Assistive device: None Gait Pattern/deviations: Step-through pattern;Decreased stride length;Decreased weight shift to left;Decreased step length - left Gait velocity: fair   General Gait Details: pt ambulated wtih no device today and noted to have occasional short steps on Lt and slight stagger toward Lt LE however no overt LOB noted. HR increaed to 120 during gait and SpO2 at 99% on RA.    Stairs        Wheelchair Mobility    Modified Rankin (Stroke Patients Only)       Balance   Sitting-balance support: Feet supported;No upper extremity supported Sitting balance-Leahy Scale: Good     Standing balance support: During functional activity;No upper extremity supported Standing balance-Leahy Scale: Good           Cognition Arousal/Alertness: Awake/alert Behavior During Therapy: WFL for tasks assessed/performed Overall Cognitive Status: Within Functional Limits for tasks assessed           Exercises Other Exercises Other Exercises: 10x Sit<>Stand: no UE use for power up (pt holding mini-squat for 5 sec on each sit). cues required to reverse sit with chest over toes for hip flexion with return to sit.    General Comments General comments (skin integrity, edema, etc.): pt with good attitude and  agreeable to work with therapy and eager to  work on activity tolerance, gait, and exercises.       Pertinent Vitals/Pain Pain Assessment: 0-10 Pain Score: 3  Pain Location: back Pain Descriptors / Indicators: Discomfort Pain Intervention(s): Monitored during session;Limited activity within patient's tolerance            PT Goals (current goals can now be found in the care plan section) Acute Rehab PT Goals Patient Stated Goal: walk, strengthen LLE PT Goal Formulation: With patient Time For Goal Achievement: 07/02/19 Potential to Achieve Goals: Good Progress towards PT goals: Progressing toward goals    Frequency    Min 3X/week      PT Plan Current plan remains appropriate       AM-PAC PT "6 Clicks" Mobility   Outcome Measure  Help needed turning from your back to your side while in a flat bed without using bedrails?: None Help needed moving from lying on your back to sitting on the side of a flat bed without using bedrails?: None Help needed moving to and from a bed to a chair (including a wheelchair)?: A Little Help needed standing up from a chair using your arms (e.g., wheelchair or bedside chair)?: A Little Help needed to walk in hospital room?: A Little Help needed climbing 3-5 steps with a railing? : A Lot 6 Click Score: 19    End of Session Equipment Utilized During Treatment: Gait belt Activity Tolerance: Patient tolerated treatment well Patient left: with family/visitor present;with call bell/phone within reach;in chair Nurse Communication: Mobility status PT Visit Diagnosis: Unsteadiness on feet (R26.81);Other abnormalities of gait and mobility (R26.89)     Time: 1426-1450 PT Time Calculation (min) (ACUTE ONLY): 24 min  Charges:  $Gait Training: 8-22 mins $Therapeutic Exercise: 8-22 mins                     Verner Mould, DPT Physical Therapist with [facility] [phone]  06/26/2019 3:28 PM

## 2019-06-26 NOTE — Progress Notes (Signed)
Palliative Care Progress Note  William Ali continues to have difficult to manage pain related to metastatic disease. He has required 14mg  of prn hydromorphone in last 24 hours. We discussed several different options for ongoing pain management and transition of care home. Our goal is to get him off IV hydromorphone although we did discuss a last resort option of sending him on a hydromorphone PCA. We also discussed the difference in anticipatory anxiety related to pain and awareness of breakthrough physical pain and interpreting what messages his body is sending him about pain and what his needs are-he has also been having nausea and HA.  His wife is in the room and is concerned about his level of activity and sedation- she worries she will not be able to help him at home and worries that they will have another situation in which he is discharged and the pain control is not right and he ends up back in the hospital. I discussed having palliative care follow him on discharge at home or I can see him in [facility] clinic - will also help them get connected with support services in [facility].   Plan is as follows for pain control:  Current PRN totals= 14mg  hydromorphone/24hrs= 168mcg of fentanyl/24hour with 50% reduction He is currently on 41mcg patch.  Some relief with addition of Toradol.  Increase Duragesic to 129mcg q72 hrs Hydromorphone oral 4mg  q2 hours breakthrough Hydromorphone 2mg  IV for back up severe pain Added Adderall 10mg  qAM for cancer related and opioid related fatigue Increased Metoprolol for BP and HR control Added once daily Celebrex to start tomorrow and will discontinue Toradol tomorrow. Changed Zofran to tablet q4 hours for nausea tx and prohylaxis Continue Decadron.  [name], DO Palliative Medicine  Time: 35 minutes Greater than 50%  of this time was spent counseling and coordinating care related to the above assessment and plan.'

## 2019-06-27 LAB — CBC WITH DIFFERENTIAL/PLATELET
Abs Immature Granulocytes: 0.04 10*3/uL (ref 0.00–0.07)
Basophils Absolute: 0 10*3/uL (ref 0.0–0.1)
Basophils Relative: 0 %
Eosinophils Absolute: 0 10*3/uL (ref 0.0–0.5)
Eosinophils Relative: 0 %
HCT: 30.6 % — ABNORMAL LOW (ref 39.0–52.0)
Hemoglobin: 9.9 g/dL — ABNORMAL LOW (ref 13.0–17.0)
Immature Granulocytes: 1 %
Lymphocytes Relative: 1 %
Lymphs Abs: 0 10*3/uL — ABNORMAL LOW (ref 0.7–4.0)
MCH: 28 pg (ref 26.0–34.0)
MCHC: 32.4 g/dL (ref 30.0–36.0)
MCV: 86.4 fL (ref 80.0–100.0)
Monocytes Absolute: 0.1 10*3/uL (ref 0.1–1.0)
Monocytes Relative: 1 %
Neutro Abs: 6.8 10*3/uL (ref 1.7–7.7)
Neutrophils Relative %: 97 %
Platelets: 130 10*3/uL — ABNORMAL LOW (ref 150–400)
RBC: 3.54 MIL/uL — ABNORMAL LOW (ref 4.22–5.81)
RDW: 17.2 % — ABNORMAL HIGH (ref 11.5–15.5)
WBC: 7 10*3/uL (ref 4.0–10.5)
nRBC: 0 % (ref 0.0–0.2)

## 2019-06-27 LAB — COMPREHENSIVE METABOLIC PANEL
ALT: 166 U/L — ABNORMAL HIGH (ref 0–44)
AST: 55 U/L — ABNORMAL HIGH (ref 15–41)
Albumin: 2.9 g/dL — ABNORMAL LOW (ref 3.5–5.0)
Alkaline Phosphatase: 342 U/L — ABNORMAL HIGH (ref 38–126)
Anion gap: 9 (ref 5–15)
BUN: 25 mg/dL — ABNORMAL HIGH (ref 6–20)
CO2: 25 mmol/L (ref 22–32)
Calcium: 8.5 mg/dL — ABNORMAL LOW (ref 8.9–10.3)
Chloride: 106 mmol/L (ref 98–111)
Creatinine, Ser: 1.12 mg/dL (ref 0.61–1.24)
GFR calc Af Amer: >60 mL/min (ref >60)
GFR calc non Af Amer: >60 mL/min (ref >60)
Glucose, Bld: 135 mg/dL — ABNORMAL HIGH (ref 70–99)
Potassium: 4.5 mmol/L (ref 3.5–5.1)
Sodium: 140 mmol/L (ref 135–145)
Total Bilirubin: 0.9 mg/dL (ref 0.3–1.2)
Total Protein: 5.9 g/dL — ABNORMAL LOW (ref 6.5–8.1)

## 2019-06-27 MED ORDER — AMPHETAMINE-DEXTROAMPHETAMINE 10 MG PO TABS: 10.0000 mg | ORAL_TABLET | Freq: Every day | ORAL | Status: DC | PRN

## 2019-06-27 MED ORDER — FLUCONAZOLE 150 MG PO TABS
150.0000 mg | ORAL_TABLET | Freq: Once | ORAL | Status: AC
  Administered 2019-06-27: 150 mg via ORAL
  Filled 2019-06-27: qty 1

## 2019-06-27 MED ORDER — NYSTATIN 100000 UNIT/ML MT SUSP
5.0000 mL | Freq: Four times a day (QID) | OROMUCOSAL | Status: DC
  Administered 2019-06-27 – 2019-06-28 (×3): 500000 [IU] via ORAL
  Filled 2019-06-27 (×2): qty 5

## 2019-06-27 MED ORDER — ZOLPIDEM TARTRATE 10 MG PO TABS: 10.0000 mg | ORAL_TABLET | Freq: Every evening | ORAL | Status: DC | PRN

## 2019-06-27 MED ORDER — DEXAMETHASONE 4 MG PO TABS
4.0000 mg | ORAL_TABLET | Freq: Two times a day (BID) | ORAL | Status: DC
  Administered 2019-06-27 – 2019-06-28 (×2): 4 mg via ORAL
  Filled 2019-06-27 (×2): qty 1

## 2019-06-27 MED ORDER — DULOXETINE HCL 30 MG PO CPEP
30.0000 mg | ORAL_CAPSULE | Freq: Every day | ORAL | Status: DC
  Administered 2019-06-27 – 2019-06-28 (×2): 30 mg via ORAL
  Filled 2019-06-27 (×2): qty 1

## 2019-06-27 NOTE — Discharge Instructions (Signed)
Information on my medicine - XARELTO (rivaroxaban)  This medication education was reviewed with me or my healthcare representative as part of my discharge preparation.  The pharmacist that spoke with me during my hospital stay was:  Netta [place], [place]? Xarelto was prescribed to treat blood clots that may have been found in the veins of your legs (deep vein thrombosis) or in your lungs (pulmonary embolism) and to reduce the risk of them occurring again.  What do you need to know about Xarelto? The starting dose is one 15 mg tablet taken TWICE daily with food for the FIRST 21 DAYS then on 07/08/19  the dose is changed to one 20 mg tablet taken ONCE A DAY with your evening meal.  DO NOT stop taking Xarelto without talking to the health care provider who prescribed the medication.  Refill your prescription for 20 mg tablets before you run out.  After discharge, you should have regular check-up appointments with your healthcare provider that is prescribing your Xarelto.  In the future your dose may need to be changed if your kidney function changes by a significant amount.  What do you do if you miss a dose? If you are taking Xarelto TWICE DAILY and you miss a dose, take it as soon as you remember. You may take two 15 mg tablets (total 30 mg) at the same time then resume your regularly scheduled 15 mg twice daily the next day.  If you are taking Xarelto ONCE DAILY and you miss a dose, take it as soon as you remember on the same day then continue your regularly scheduled once daily regimen the next day. Do not take two doses of Xarelto at the same time.   Important Safety Information Xarelto is a blood thinner medicine that can cause bleeding. You should call your healthcare provider right away if you experience any of the following: ? Bleeding from an injury or your nose that does not stop. ? Unusual colored urine (red or dark brown) or unusual  colored stools (red or black). ? Unusual bruising for unknown reasons. ? A serious fall or if you hit your head (even if there is no bleeding).  Some medicines may interact with Xarelto and might increase your risk of bleeding while on Xarelto. To help avoid this, consult your healthcare provider or pharmacist prior to using any new prescription or non-prescription medications, including herbals, vitamins, non-steroidal anti-inflammatory drugs (NSAIDs) and supplements.  This website has more information on Xarelto: [email or web address]

## 2019-06-27 NOTE — Evaluation (Signed)
Clinical/Bedside Swallow Evaluation Patient Details  Name: William Ali MRN: [id number] Date of Birth: 1985-09-10  Today's Date: 06/27/2019 Time: SLP Start Time (ACUTE ONLY): 0808 SLP Stop Time (ACUTE ONLY): 0830 SLP Time Calculation (min) (ACUTE ONLY): 22 min  Past Medical History:  Past Medical History:  Diagnosis Date  . Back pain   . Cancer ([place])   . Hypertension    Past Surgical History:  Past Surgical History:  Procedure Laterality Date  . IR IMAGING GUIDED PORT INSERTION  06/12/2019  . KNEE SURGERY     HPI:  34 yo male adm to [facility] with retractable pain.  Pt recently diagnosed with primary lung cancer with mets to liver, brain, spine.  He has been undergoing XRT and now chemotherapy.  Swallow evaluation was ordered.   Assessment / Plan / Recommendation Clinical Impression  At this time, pt with no cranial nerve deficits impacting swallow ability. He does appear with whitish areas on soft palate and lateral sulci that may be consistent with oral candidiasis. RN and pt made aware. Advised pt to brush teeth, gums, etc and expectorate aggressively to determine if clears.  Pt's symptoms of burning in esophagus, sensing slow clearance and discomfort with certain foods/liquids are consistent with esophagitis.  SLP provided pt with written handout regarding xerostomia and mucositis/esophagitis compensation strategies.  Question if he may benefit from magic mouthrinse for use prn.  NO indication of oropharyngeal dysphagia nor aspiration with all po observed - muffin, 3 ounce water test and further intake of water.  No SLP follow up indicated at this time as all education completed to maximize comfort/safety with po. SLP Visit Diagnosis: Dysphagia, unspecified (R13.10)    Aspiration Risk  No limitations    Diet Recommendation Regular;Thin liquid   Liquid Administration via: Straw;Cup Medication Administration: Whole meds with liquid Supervision: Patient able to self feed Compensations:  Slow rate;Small sips/bites(small frequent meals, start with liquids) Postural Changes: Remain upright for at least 30 minutes after po intake;Seated upright at 90 degrees    Other  Recommendations Oral Care Recommendations: Oral care BID   Follow up Recommendations None      Frequency and Duration     n/a       Prognosis    n/a    Swallow Study   General Date of Onset: 06/27/19 HPI: 34 yo male adm to [facility] with retractable pain.  Pt recently diagnosed with primary lung cancer with mets to liver, brain, spine.  He has been undergoing XRT and now chemotherapy.  Swallow evaluation was ordered. Type of Study: Bedside Swallow Evaluation Diet Prior to this Study: Regular;Thin liquids Temperature Spikes Noted: No Respiratory Status: Room air History of Recent Intubation: No Behavior/Cognition: Alert;Cooperative;Pleasant mood Oral Cavity Assessment: Other (comment)(white spots in posterior oral cavity c/w oral candidiasis) Oral Care Completed by SLP: No Oral Cavity - Dentition: Adequate natural dentition Vision: Functional for self-feeding Self-Feeding Abilities: Able to feed self Patient Positioning: Upright in bed Baseline Vocal Quality: Normal Volitional Cough: Other (Comment)(dnt due to back pain) Volitional Swallow: Able to elicit    Oral/Motor/Sensory Function Overall Oral Motor/Sensory Function: Within functional limits   Ice Chips Ice chips: Not tested Other Comments: cold causes odynophagia   Thin Liquid Thin Liquid: Within functional limits Presentation: Self Fed;Spoon Other Comments: 3 ounce yale test passed    Nectar Thick Nectar Thick Liquid: Not tested   Honey Thick Honey Thick Liquid: Not tested   Puree Puree: Not tested   Solid  Solid: Within functional limits Presentation: Self Fredirick Ali 06/27/2019,8:43 AM   Kathleen Lime, MS [place] Office [phone]

## 2019-06-27 NOTE — Progress Notes (Signed)
PROGRESS NOTE  William Ali HQP:[id number] DOB: February 12, 1985 DOA: 06/17/2019 PCP: Patient, No Pcp Per  HPI/Recap of past 24 hours: William J Carsonis a 34 y.o.maleveryunfortunate young manwith medical history significant ofmetastatic lung cancer who presents to the emergency department with complaints of headache,chest pain,back pain, abdominal pain. He was bothered by pain in upper back and chest. He was also having abdominal discomfort and severe headache. There is no report of fever, chills, nausea, vomiting, dysuria or shortness of breath.He has been currently treated with radiation therapy and is starting on chemotherapy soon. He was diagnosed with DVT on 06/04/2019 and was diagnosed with PE on his last admission. He was treated with heparin drip for his PE and was discharged on Xarelto. He also recently had a Chemo-Port placement on 06/12/2019. On presentation, he was in sinus tachycardia, quite hypertensive most likely secondary to pain. X-rays show continued extensive, expanding tumor burden. Patient was admitted for pain management, supportive care and possibly starting urgent radiation therapy for his cord compression on the T-spine.He has been started on Decadron. Palliative care consultation. Oncology and radiation oncology consulted.  PMT consulted for cancer related pain management.    Today, patient's still complaining of some abdominal pain, mild headache, back pain, with some odynophagia, but overall, pain management adequate.  Denies any chest pain, shortness of breath, nausea/vomiting, fever/chills.    Assessment/Plan: Principal Problem:   Metastatic primary lung cancer ([place]) Active Problems:   Goals of care, counseling/discussion   Pulmonary embolism ([place])   Metastasis of neoplasm to spinal canal [facility])   Palliative care encounter  Metastatic adenocarcinoma of the right lung with mets to brain, spine and liver. Management directed by oncology and palliative  care Chemo-Port in place on right chest. Cancer related pain management per PMT (Dr. Hilma Favors following patient very closely, would recommend/place pain medication upon discharge) Oncology on board, received his first dose of chemo (pemetrexed and carboplatin) on 06/23/2019 without any issues Rad-onc also on board  T-spine cord compression Continue IV Decadron 4 mg twice daily Continue pain management as recommended by PMT  Possible oral candidiasis Start nystatin swish and swallow If worsens, may need Diflucan  DVT/PE, recently diagnosed Continue oral anticoagulation, Xarelto  Intractable cough Improved Continue Tessalon Perles  Transaminitis Likely from mets to liver Daily CMP  Anemia of chronic disease in the setting of malignancy/thrombocytopenia Hemoglobin stable with no overt bleeding Daily CBC  Headache/left eye vision loss Primary adenocarcinoma the right with mets to the brain and left retina  Goals of care Palliative care team following Currently a full code    DVT prophylaxis: [id number]  Code Status: Full code  Family Communication:  Discussed with wife at bedside on 06/27/2019    Consultants:   Palliative care  Neurosurgery  Radiation Oncology  Medical Oncology  Procedures:  None     Status is: Inpatient   Dispo: The patient is from: Home.              Anticipated d/c is to: Home in the next 24 hours               Anticipated d/c date is: 06/28/2019 as per Dr. Hilma Favors              Patient currently ongoing oncology treatment and pain management with IV pain medications.    Objective: Vitals:   06/26/19 1720 06/26/19 1952 06/27/19 0610 06/27/19 1329  BP: (!) 147/88 (!) 152/88 (!) 150/91 (!) 145/95  Pulse: 90 87 74  84  Resp: 20 18 19 17   Temp: 98 F (36.7 C) 98.2 F (36.8 C) 97.8 F (36.6 C) 98.5 F (36.9 C)  TempSrc: Oral Oral Oral Oral  SpO2: 100% 100% 97% 100%  Weight:      Height:        Intake/Output Summary (Last 24  hours) at 06/27/2019 1737 Last data filed at 06/27/2019 1007 Gross per 24 hour  Intake --  Output 725 ml  Net -725 ml   Filed Weights   06/17/19 1821  Weight: 99 kg    Exam:  General: NAD, left eye visual loss  Cardiovascular: S1, S2 present  Respiratory: CTAB  Abdomen: Soft, +tender, nondistended, bowel sounds present  Musculoskeletal: No bilateral pedal edema noted  Skin: Normal  Psychiatry: Normal mood   Data Reviewed: CBC: Recent Labs  Lab 06/23/19 1009 06/24/19 0550 06/25/19 0546 06/26/19 0334 06/27/19 0500  WBC 9.1 10.8* 8.6 8.3 7.0  NEUTROABS 8.2* 9.6* 7.9* 8.0* 6.8  HGB 10.4* 10.7* 10.2* 10.3* 9.9*  HCT 33.4* 33.9* 33.4* 32.0* 30.6*  MCV 88.1 87.1 87.9 87.0 86.4  PLT 164 153 144* 121* 580*   Basic Metabolic Panel: Recent Labs  Lab 06/23/19 1009 06/24/19 0550 06/25/19 0546 06/26/19 0334 06/27/19 0500  NA 140 137 141 139 140  K 4.1 4.2 4.4 4.6 4.5  CL 107 104 108 104 106  CO2 23 25 25 24 25   GLUCOSE 142* 168* 140* 178* 135*  BUN 24* 27* 28* 25* 25*  CREATININE 1.16 1.18 1.15 1.14 1.12  CALCIUM 8.5* 8.6* 8.5* 8.5* 8.5*   GFR: Estimated Creatinine Clearance: 104.2 mL/min (by C-G formula based on SCr of 1.12 mg/dL). Liver Function Tests: Recent Labs  Lab 06/23/19 1009 06/24/19 0550 06/25/19 0546 06/26/19 0334 06/27/19 0500  AST 70* 48* 62* 51* 55*  ALT 231* 214* 202* 179* 166*  ALKPHOS 361* 373* 362* 358* 342*  BILITOT 0.4 0.5 0.4 0.6 0.9  PROT 5.9* 6.4* 6.1* 5.8* 5.9*  ALBUMIN 2.8* 3.1* 2.9* 2.7* 2.9*   No results for input(s): LIPASE, AMYLASE in the last 168 hours. No results for input(s): AMMONIA in the last 168 hours. Coagulation Profile: No results for input(s): INR, PROTIME in the last 168 hours. Cardiac Enzymes: No results for input(s): CKTOTAL, CKMB, CKMBINDEX, TROPONINI in the last 168 hours. BNP (last 3 results) No results for input(s): PROBNP in the last 8760 hours. HbA1C: No results for input(s): HGBA1C in the last 72  hours. CBG: No results for input(s): GLUCAP in the last 168 hours. Lipid Profile: No results for input(s): CHOL, HDL, LDLCALC, TRIG, CHOLHDL, LDLDIRECT in the last 72 hours. Thyroid Function Tests: No results for input(s): TSH, T4TOTAL, FREET4, T3FREE, THYROIDAB in the last 72 hours. Anemia Panel: No results for input(s): VITAMINB12, FOLATE, FERRITIN, TIBC, IRON, RETICCTPCT in the last 72 hours. Urine analysis:    Component Value Date/Time   COLORURINE YELLOW 06/02/2019 1838   APPEARANCEUR CLEAR 06/02/2019 1838   LABSPEC 1.016 06/02/2019 1838   PHURINE 5.0 06/02/2019 1838   GLUCOSEU NEGATIVE 06/02/2019 1838   HGBUR LARGE (A) 06/02/2019 1838   BILIRUBINUR NEGATIVE 06/02/2019 1838   KETONESUR NEGATIVE 06/02/2019 1838   PROTEINUR NEGATIVE 06/02/2019 1838   UROBILINOGEN 1.0 05/02/2014 1253   NITRITE NEGATIVE 06/02/2019 1838   LEUKOCYTESUR NEGATIVE 06/02/2019 1838   Sepsis Labs: @LABRCNTIP (procalcitonin:4,lacticidven:4)  ) No results found for this or any previous visit (from the past 240 hour(s)).    Studies: No results found.  Scheduled Meds: . amphetamine-dextroamphetamine  10 mg Oral Daily  . celecoxib  200 mg Oral Daily  . Chlorhexidine Gluconate Cloth  6 each Topical Daily  . dexamethasone  4 mg Oral Q12H  . DULoxetine  30 mg Oral Daily  . fentaNYL  1 patch Transdermal Q72H  . fluconazole  150 mg Oral Once  . folic acid  1 mg Oral Daily  . metoprolol tartrate  25 mg Oral BID  . ondansetron  4 mg Oral Q4H  . pantoprazole  40 mg Oral BID  . polyethylene glycol  17 g Oral Daily  . rivaroxaban  15 mg Oral BID WC   Followed by  . [START ON 07/08/2019] rivaroxaban  20 mg Oral Q supper  . senna  2 tablet Oral BID  . sodium chloride flush  10-40 mL Intracatheter Q12H    Continuous Infusions: . sodium chloride 75 mL/hr at 06/27/19 0518     LOS: 10 days     Alma Friendly, MD [facility]  If 7PM-7AM, please contact  night-coverage [email or web address] 06/27/2019, 5:37 PM

## 2019-06-27 NOTE — Progress Notes (Signed)
Physical Therapy Treatment Patient Details Name: William Ali MRN: [id number] DOB: 1985-10-03 Today's Date: 06/27/2019    History of Present Illness William Ali is a 34 y.o. 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  Patient was seen in the ED on 05/10/2019 with a headache and left pelvic pain.  He subsequently underwent CT of the C/A/P and MRI of the brain and was found to have widely metastatic disease with a right lung mass, lytic lesions throughout the thoracic and lumbar spine and pelvis, and numerous liver lesions.  Patient subsequently underwent liver biopsy on 05/16/2019 with pathology showing poorly differentiated adenocarcinoma.    PT Comments    Pt demonstrating good progress with mild gait deviations but no LOB.  Continue to progress mobility and strength as able. Cont POC.     Follow Up Recommendations  Home health PT;Supervision - Intermittent     Equipment Recommendations  3in1 (PT)    Recommendations for Other Services       Precautions / Restrictions Precautions Precautions: None    Mobility  Bed Mobility Overal bed mobility: Needs Assistance Bed Mobility: Supine to Sit   Sidelying to sit: Supervision          Transfers Overall transfer level: Needs assistance Equipment used: None Transfers: Sit to/from Stand Sit to Stand: Supervision            Ambulation/Gait Ambulation/Gait assistance: Supervision Gait Distance (Feet): 400 Feet Assistive device: None Gait Pattern/deviations: Step-through pattern;Decreased stride length     General Gait Details: Ambulated with no device; noted L LE with toe in and occasional scissor but no LOB; cued for increased BOS; VSS   Stairs Stairs: Yes Stairs assistance: Min guard Stair Management: One rail Left;Alternating pattern;Forwards Number of Stairs: 4 General stair comments: without difficulty; limited due to  IV   Wheelchair Mobility    Modified Rankin (Stroke Patients Only)       Balance Overall balance assessment: Needs assistance Sitting-balance support: Feet supported;No upper extremity supported Sitting balance-Leahy Scale: Normal     Standing balance support: During functional activity;No upper extremity supported Standing balance-Leahy Scale: Good                              Cognition Arousal/Alertness: Awake/alert Behavior During Therapy: WFL for tasks assessed/performed Overall Cognitive Status: Within Functional Limits for tasks assessed                                        Exercises      General Comments General comments (skin integrity, edema, etc.): Pt with good attitude and agreeable to work with therapy.  VSS.  Held exercises due to pt's lunch arrived and ready to eat.      Pertinent Vitals/Pain Pain Assessment: No/denies pain    Home Living                      Prior Function            PT Goals (current goals can now be found in the care plan section) Progress towards PT goals: Progressing toward goals    Frequency    Min 3X/week      PT Plan Current plan remains appropriate    Co-evaluation  AM-PAC PT "6 Clicks" Mobility   Outcome Measure  Help needed turning from your back to your side while in a flat bed without using bedrails?: None Help needed moving from lying on your back to sitting on the side of a flat bed without using bedrails?: None Help needed moving to and from a bed to a chair (including a wheelchair)?: None Help needed standing up from a chair using your arms (e.g., wheelchair or bedside chair)?: None Help needed to walk in hospital room?: None Help needed climbing 3-5 steps with a railing? : None 6 Click Score: 24    End of Session   Activity Tolerance: Patient tolerated treatment well Patient left: with family/visitor present;with call bell/phone within  reach;in chair Nurse Communication: Mobility status(pt ambulating with nursing and family in addition to PT) PT Visit Diagnosis: Unsteadiness on feet (R26.81);Other abnormalities of gait and mobility (R26.89)     Time: 4360-1658 PT Time Calculation (min) (ACUTE ONLY): 17 min  Charges:  $Gait Training: 8-22 mins                     Maggie Font, PT Acute Rehab Services Pager [phone] [facility] [phone] [facility] [place] 06/27/2019, 3:10 PM

## 2019-06-27 NOTE — Progress Notes (Signed)
Clinical swallow evaluation completed.  At this time, pt with no cranial nerve deficits impacting swallow ability. He does appear with whitish areas on soft palate and lateral sulci that may be consistent with oral candidiasis. RN and pt made aware. Advised pt to brush teeth, gums, etc and expectorate aggressively to determine if clears.  Pt's symptoms of burning in esophagus, sensing slow clearance and discomfort with certain foods/liquids are consistent with esophagitis.  SLP provided pt with written handout regarding xerostomia and mucositis/esophagitis compensation strategies.  Question if he may benefit from magic mouthrinse for use prn.  NO indication of oropharyngeal dysphagia nor aspiration with all po observed - muffin, 3 ounce water test and further intake of water.  No SLP follow up indicated at this time as all education completed to maximize comfort/safety with po.    Kathleen Lime, MS [place] [facility] [phone]

## 2019-06-27 NOTE — Progress Notes (Signed)
OT Cancellation Note  Patient Details Name: William Ali MRN: [id number] DOB: [birth year]   Cancelled Treatment:    Reason Eval/Treat Not Completed: Patient declined, no reason specified. Patient reports just ambulated in hallway and is requesting to rest. Will check back as schedule allows.  Delbert Phenix OT Pager: [place] 06/27/2019, 2:21 PM

## 2019-06-27 NOTE — Progress Notes (Signed)
Palliative Care Progress Note Symptom Management   34 yo man with newly diagnosed widely metastatic primary non-small cell lung cancer involving spine, pelvis, liver and brain admitted with intractable pain and extremity weakness on 5/22 with mild spinal cord compression at T3,T5-7and T9. Has required increased doses of IV opioids without adequate relief.  S/P Whole Brain Radiation and focused spinal radiation completed 5/24  Initiated in-hospital chemotherapy 5/28 Carboplatin/Pemetrexed CID1  Fentanyl Patch increased yesterday to 129mcg, less use of IV hydromorphone but still 4 prn doses in last 24 hours-I strongly encouraged him to use PO instead of IV pain medication-ordered PO to be given before IV. Started hydromorphone 4mg -8mg  PO for breakthrough pain yesterday - I provided education on this to his wife. Also began daily celebrex for inflammatory pain.  Assessment: William Ali issues continue to be trying to achieve pain control without causing over sedation. For the past several days he has been much more withdrawn- the goal of pain management is to improve function but it is difficult to sort out how much of this is related to side effects of chemotherapy, emotional distress, progression of his cancer or related to increased opioid dosing. Yesterday he complained to RN about peri-oribtal numbness and difficulty swallowing-like his throat was "tight". This has resolved today. He also reports poor sleep last PM-he was given ativan at 10PM but may have had a paradoxical reaction- he also said he was having diarrhea and was restless.  1. Pain, cancer related, mild cord compression, liver capsular pain, multifactorial pain- still asking for PRN hydromorphone IV, has not wanted to take PO form per nursing-I strongly encouraged this today- I don't want him to not have IV medication if needed but I also want him to at least try to take the oral first. He has tolerated fentanyl dose adjustment. I will  follow him outpatient for ongoing pain management- It is likely I will need to convert him to methadone if our current regimen cannot provide better relief.  2. Severe Fatigue, Multifactorial: chemoradiation, cancer, medication induced Started Adderall yesterday and he responded well to first dose and was able to get oob and walk around-he has refused his dose today despite encouragement.   3. Nausea, Diarrhea, likely chemotherapy related can use higher doses of immodium, doubt infectious etiology.  4. ?Acute Neurotoxic Side Effects: perioral numbness and throat spasm yesterday, he has also had his head and eyes covered on every visit I have made for the past few days- HA and light sensitivity have been present. Denies diplopia, dizziness or any hearing issues. Does complain of fleeting pins and needles feelings. I provided counseling on avoiding cold drinks, or ice chips which may make things worse for him. He has pain that persists in his Torso and pelvis- he reports heat is very helpful for this pain which may suggest a neuropathic process.  Will start him on Duloxitine (Cymbalta) 30mg  daily and increase this in about 2-3 weeks.  Will need to continue to track progression of these symptoms.  His wife denies any changes in his mental status other than severe fatigue and motivation.  5. Oral Candidiasis: Begin to have signs of thrush infection on tounge and buccal surfaces, will give fluconazole 150mg  X1 and order daily MMW.  6. Emotional/Coping: Tomorrow is William Ali's birthday-he hopes to go home, but he and his wife are nervous to go home and leave the security of the hospital setting- I provided encouragement and support.  Planning on discharge tomorrow.  William Hacker, DO Palliative  Medicine   Time: 35 min Greater than 50%  of this time was spent counseling and coordinating care related to the above assessment and plan.

## 2019-06-27 NOTE — Progress Notes (Signed)
HEMATOLOGY-ONCOLOGY PROGRESS NOTE  Hematological/Oncological History #MetastaticAdenocarcinoma of the Lungwith Brain, Osseus,and Liver Metastasis 1)04/24/2018: Chest radiograph shows a 1.7 cm nodular density. Short term f/u of this lesion was recommended. CXR on 05/01/2018 performed with recommended f/u imaging. 2) 05/10/2019: presented to the ED with headache and left pelvis pain. Patient underwent CT C/A/P and MRI brain. Brain MRI showed numerous enhancing parenchymal intracranial lesions likely reflecting metastases. CT scan showedcavitary lesion in therightlung apex with spiculated margins. Additional sites of disease include lymph nodes of the chest and numerous liver mets. In the bones there weremultiple lytic appearing lesions throughout the thoracic and lumbar spine as well as portions of the bony pelvis. Referred to Oncology. 3) 05/16/2019: US biopsy of the liver performed. Findings consistent with a poorly differentiated adenocarcinoma.  4) 05/19/2019: establish care with Dr. Lorenso Courier 5) 5/282021: C1D1 of Carboplatin/Pemetrexed. Pembrolizumab held due to logistical issues administering in the inpatient setting.   Interval History: --received carboplatin/pemetrexed on 06/23/2019. Has tolerated therapy well --continues to have intractable pain, requiring PRN IV dilaudid. Pain primarily in chest, back, and head.  --Palliative care following and assisting in pain management --counts have remained stable, no other major symptoms other than pain at this time.   Oncology History  Metastatic adenocarcinoma to brain [facility])  05/21/2019 Initial Diagnosis   Metastatic adenocarcinoma to brain ([place])   06/23/2019 -  Chemotherapy   The patient had palonosetron (ALOXI) injection 0.25 mg, 0.25 mg, Intravenous,  Once, 1 of 6 cycles PEMEtrexed (ALIMTA) 1,100 mg in sodium chloride 0.9 % 100 mL chemo infusion, 500 mg/m2 = 1,100 mg, Intravenous,  Once, 1 of 6 cycles CARBOplatin (PARAPLATIN) 730 mg in sodium  chloride 0.9 % 250 mL chemo infusion, 730 mg (115.3 % of original dose 629.5 mg), Intravenous,  Once, 1 of 6 cycles Dose modification:   (original dose 629.5 mg, Cycle 1) fosaprepitant (EMEND) 150 mg in sodium chloride 0.9 % 145 mL IVPB, 150 mg, Intravenous,  Once, 1 of 6 cycles pembrolizumab (KEYTRUDA) 200 mg in sodium chloride 0.9 % 50 mL chemo infusion, 200 mg, Intravenous, Once, 0 of 5 cycles  for chemotherapy treatment.    Adenocarcinoma of lung ([place])  05/21/2019 Initial Diagnosis   Adenocarcinoma of lung ([place])   06/23/2019 -  Chemotherapy   The patient had palonosetron (ALOXI) injection 0.25 mg, 0.25 mg, Intravenous,  Once, 1 of 6 cycles PEMEtrexed (ALIMTA) 1,100 mg in sodium chloride 0.9 % 100 mL chemo infusion, 500 mg/m2 = 1,100 mg, Intravenous,  Once, 1 of 6 cycles CARBOplatin (PARAPLATIN) 730 mg in sodium chloride 0.9 % 250 mL chemo infusion, 730 mg (115.3 % of original dose 629.5 mg), Intravenous,  Once, 1 of 6 cycles Dose modification:   (original dose 629.5 mg, Cycle 1) fosaprepitant (EMEND) 150 mg in sodium chloride 0.9 % 145 mL IVPB, 150 mg, Intravenous,  Once, 1 of 6 cycles pembrolizumab (KEYTRUDA) 200 mg in sodium chloride 0.9 % 50 mL chemo infusion, 200 mg, Intravenous, Once, 0 of 5 cycles  for chemotherapy treatment.      I have reviewed the past medical history, past surgical history, social history and family history with the patient and they are unchanged from previous note.   PHYSICAL EXAMINATION: ECOG PERFORMANCE STATUS: 2 - Symptomatic, <50% confined to bed  Vitals:   06/26/19 1952 06/27/19 0610  BP: (!) 152/88 (!) 150/91  Pulse: 87 74  Resp: 18 19  Temp: 98.2 F (36.8 C) 97.8 F (36.6 C)  SpO2: 100% 97%   Filed  Weights   06/17/19 1821  Weight: 218 lb 4.1 oz (99 kg)    Intake/Output from previous day: 05/31 0701 - 06/01 0700 In: -  Out: 375 [Urine:375] LABORATORY DATA:  I have reviewed the data as listed CMP Latest Ref Rng & Units 06/27/2019  06/26/2019 06/25/2019  Glucose 70 - 99 mg/dL 135(H) 178(H) 140(H)  BUN 6 - 20 mg/dL 25(H) 25(H) 28(H)  Creatinine 0.61 - 1.24 mg/dL 1.12 1.14 1.15  Sodium 135 - 145 mmol/L 140 139 141  Potassium 3.5 - 5.1 mmol/L 4.5 4.6 4.4  Chloride 98 - 111 mmol/L 106 104 108  CO2 22 - 32 mmol/L 25 24 25   Calcium 8.9 - 10.3 mg/dL 8.5(L) 8.5(L) 8.5(L)  Total Protein 6.5 - 8.1 g/dL 5.9(L) 5.8(L) 6.1(L)  Total Bilirubin 0.3 - 1.2 mg/dL 0.9 0.6 0.4  Alkaline Phos 38 - 126 U/L 342(H) 358(H) 362(H)  AST 15 - 41 U/L 55(H) 51(H) 62(H)  ALT 0 - 44 U/L 166(H) 179(H) 202(H)    Lab Results  Component Value Date   WBC 7.0 06/27/2019   HGB 9.9 (L) 06/27/2019   HCT 30.6 (L) 06/27/2019   MCV 86.4 06/27/2019   PLT 130 (L) 06/27/2019   NEUTROABS 6.8 06/27/2019    DG Chest 2 View  Result Date: 06/17/2019 CLINICAL DATA:  Chest pain, history of prior PE EXAM: CHEST - 2 VIEW COMPARISON:  06/13/2019 FINDINGS: Cardiac shadow is within normal limits. Left lung is clear. Fullness in the right hilum and suprahilar region is noted with volume loss in the upper lobe consistent with the known mass lesion. Right chest wall port is seen in satisfactory position. IMPRESSION: Increasing fullness in the right hilar and suprahilar region with volume loss in the upper lobe slightly progressed when compared with the prior exam. Electronically Signed   By: Inez Catalina M.D.   On: 06/17/2019 14:58   DG Tibia/Fibula Left  Result Date: 06/04/2019 CLINICAL DATA:  Leg pain EXAM: LEFT TIBIA AND FIBULA - 2 VIEW COMPARISON:  None. FINDINGS: No fracture of the tibia or fibula. Knee joint and ankle joint appear normal on two views. The lateral tibial plateau appears irregular however on the comparison femur exam same day the lateral tibial plateau appears normal. IMPRESSION: No fracture or dislocation. No aggressive osseous lesion identified. Electronically Signed   By: Suzy Bouchard M.D.   On: 06/04/2019 09:00   CT HEAD WO CONTRAST  Result  Date: 06/17/2019 CLINICAL DATA:  Known brain metastases. Worsening headache. Patient recently started anticoagulation. EXAM: CT HEAD WITHOUT CONTRAST CT CERVICAL SPINE WITHOUT CONTRAST TECHNIQUE: Multidetector CT imaging of the head and cervical spine was performed following the standard protocol without intravenous contrast. Multiplanar CT image reconstructions of the cervical spine were also generated. COMPARISON:  Jun 04, 2019 CT scan of the brain. Brain MRI May 10, 2019. FINDINGS: CT HEAD FINDINGS Brain: No subdural, epidural, or subarachnoid hemorrhage. Most of the patient's known brain metastases are not visualized on this unenhanced CT scan. There is suggestion of a few metastases, very subtle. No acute cortical ischemia or infarct. Cerebellum, brainstem, and basal cisterns are unremarkable. No midline shift or mass effect identified. Vascular: No hyperdense vessel or unexpected calcification. Skull: Suspected calvarial metastases seen on MRI are not well appreciated on this study. Sinuses/Orbits: Minimal fluid in the posterior right mastoid air cells without bony erosion. There is opacification of the sphenoid sinuses which is worsened in the interval. Other: The patient is suspected left retinal metastasis likely presents  with slight thickening on today's CT scan, very subtle. CT CERVICAL SPINE FINDINGS Alignment: Normal. Skull base and vertebrae: No fractures identified. Relative lucency in the left lamina and pedicle at C2, extending into the spinous process with periosteal reaction. No other discrete bony lesions in the cervical spine. A lucency in the right mandibular head is well-circumscribed. Soft tissues and spinal canal: No prevertebral fluid or swelling. No visible canal hematoma. Disc levels:  Mild multilevel degenerative disc disease. Upper chest: Only the superior most aspect of the right apical mass is seen on this CT. Several small nodules in the left apex are better assessed on today's CT  scan of the chest. Other: No other abnormalities. IMPRESSION: 1. 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 There is suggestion of some of the metastases, very subtle. No midline shift. Basal cisterns are patent. 2. 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 3. Suspected calvarial metastases on the previous MRI are not well seen on this study. 4. No fracture or malalignment in the cervical spine. Lucency in the left C2 lamina and pedicle extending into the spinous process with periosteal reaction suggest metastatic disease in this region. No other definitive bony metastatic disease in the cervical spine. 5. There is a rounded lucency in the right mandibular head which is nonspecific. 6. Nodularity in the lung apices. See the CT scan of the chest for better evaluation of this region. Electronically Signed   By: Dorise Bullion III M.D   On: 06/17/2019 16:37   CT Head Wo Contrast  Result Date: 06/04/2019 CLINICAL DATA:  Worsening headaches. Additional history provided: Recently diagnosed with metastatic lung cancer. EXAM: CT HEAD WITHOUT CONTRAST TECHNIQUE: Contiguous axial images were obtained from the base of the skull through the vertex without intravenous contrast. COMPARISON:  Brain MRI 05/10/2019, head CT 09/13/2012 FINDINGS: Brain: Numerous small enhancing parenchymal lesions were demonstrated on prior MRI 05/10/2019. Some of these are appreciated on today's examination but many of these are occult by CT. Several of these lesions were hemorrhagic on the prior MRI. There is a 3 mm focus of hyperdensity within the left occipital lobe consistent with redemonstrated hemorrhage associated with a lesion at this site (series 2, image 17). No definitively new lesion is identified. No demarcated cortical infarct. No extra-axial fluid collection. No evidence of intracranial mass. No midline shift. Vascular: No hyperdense vessel.  Skull: No calvarial fracture. Suspected metastases within the skull base and upper cervical spine were better appreciated on prior MRI. Sinuses/Orbits: No acute orbital abnormality by CT. Please note subtle enhancement along the posterior left globe with suspected on prior MRI. Ethmoid and sphenoid sinus mucosal thickening. Right sphenoid sinus air-fluid level. No significant mastoid effusion IMPRESSION: Numerous small parenchymal lesions (likely metastases) were demonstrated on prior MRI 05/10/2019. Some of these lesions are appreciated on today's examination, but many of these are occult by CT. There is a 3 mm focus of hyperdensity consistent with hemorrhage at site of a known left occipital lobe lesion. However, this is unchanged and hemorrhage was present at this site on prior MRI. No definitively new lesion or interval acute intracranial abnormality is identified. Suspected metastases within the skull base and upper cervical spine were better appreciated on prior MRI. Paranasal sinus mucosal thickening with right sphenoid sinus air-fluid level. Correlate for acute sinusitis. Electronically Signed   By: Kellie Simmering DO   On: 06/04/2019 08:44   CT ANGIO  CHEST PE W OR WO CONTRAST  Result Date: 06/17/2019 CLINICAL DATA:  Recently diagnosed metastatic lung cancer, worsening shortness of breath, chest pain, dyspnea EXAM: CT ANGIOGRAPHY CHEST WITH CONTRAST TECHNIQUE: Multidetector CT imaging of the chest was performed using the standard protocol during bolus administration of intravenous contrast. Multiplanar CT image reconstructions and MIPs were obtained to evaluate the vascular anatomy. CONTRAST:  136mL OMNIPAQUE IOHEXOL 350 MG/ML SOLN COMPARISON:  06/13/2019 FINDINGS: Cardiovascular: This is a technically adequate evaluation of the pulmonary vasculature. The chronic right lower lobe pulmonary emboli seen previously are again noted and unchanged. There are no acute pulmonary emboli. The right hilar mass results  in extrinsic compression of the right upper lobe pulmonary artery, unchanged since prior exam. No pericardial effusion.  The thoracic aorta remains unremarkable. Mediastinum/Nodes: Mediastinal and right hilar adenopathy unchanged since prior exam. Index lymph node in the right paratracheal region image 22 measures 11 mm in short axis, stable since previous study. Right hilar mass measures 2.5 cm reference image 37, previously measuring approximately 2.2 cm. No new adenopathy. The thyroid, trachea, and esophagus are grossly normal. Lungs/Pleura: Thick-walled cavitary right upper lobe mass is again noted, measuring approximately 2.8 x 3.6 cm reference image 23, previously measuring 3.4 x 2.2 cm by my measurement. Findings are consistent with primary malignancy. Progressive wedge-shaped consolidation within the right upper lobe likely reflects pulmonary infarct given the extrinsic compression on the right upper lobe pulmonary artery by the infiltrative right hilar mass. Subcentimeter nodule left upper lobe image 28 and superior segment left lower lobe image 39 unchanged. There are trace bilateral pleural effusions right greater than left. No pneumothorax. There is narrowing of the right upper lobe bronchus and segmental airways due to the infiltrative right hilar mass described above, similar to previous study. Upper Abdomen: Numerous liver metastases are noted, please refer to CT abdomen report. Musculoskeletal: Mottled appearance of the thoracic spine consistent with bony metastases, stable since prior study. Pathologic T3 fracture is seen with mild retropulsion resulting in at least 50% narrowing of the central canal. There is likely an associated soft tissue component. No other pathologic fractures. Review of the MIP images confirms the above findings. IMPRESSION: 1. 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 No acute pulmonary embolus. 2. Cavitating right upper lobe mass consistent with lung cancer.  Minimal increase in size. 3. Marked right hilar and mediastinal adenopathy as above, without significant change. There is extrinsic compression upon the right upper lobe pulmonary artery and narrowing of the right upper lobe bronchus due to the right hilar mass, stable. 4. Progressive right upper lobe pulmonary infarct due to the extrinsic compression on the right upper lobe pulmonary artery. 5. Diffuse bony metastases. Pathologic T3 fracture with resulting central canal narrowing as above. Please refer to CT thoracic spine report. 6. Trace bilateral pleural effusions. 7. Liver metastases.  Please refer to abdominal CT report. Electronically Signed   By: Randa Ngo M.D.   On: 06/17/2019 16:30   CT Angio Chest PE W and/or Wo Contrast  Result Date: 06/13/2019 CLINICAL DATA:  Shortness of breath. Chest pain. Metastatic lung carcinoma. Active chemotherapy and radiation. EXAM: CT ANGIOGRAPHY CHEST WITH CONTRAST TECHNIQUE: Multidetector CT imaging of the chest was performed using the standard protocol during bolus administration of intravenous contrast. Multiplanar CT image reconstructions and MIPs were obtained to evaluate the vascular anatomy. CONTRAST:  166mL OMNIPAQUE IOHEXOL 350 MG/ML SOLN COMPARISON:  Chest radiograph earlier this day. Most recent chest CT 05/10/2019 FINDINGS: Cardiovascular: Right hilar adenopathy/soft  tissue density causes severe narrowing of the right upper lobe pulmonary arteries which are attenuated. This causes mass effect on the distal main right pulmonary artery. Circumferential soft tissue density involving the right lower lobar pulmonary artery. Filling defects within the segmental branches of the right lower lobe pulmonary arteries are eccentric and may be due to direct invasion or de novo pulmonary embolus, for example series 6, image 145 and 149. No definite pulmonary emboli on the left, however breathing motion artifact and contrast bolus timing limits detailed assessment.  Thoracic aorta is normal in caliber without dissection. Right chest port tip in the lower SVC. Heart is normal in size. No pericardial effusion. Mediastinum/Nodes: Progressive right hilar adenopathy/soft tissue density causes severe narrowing of the right upper lobe pulmonary arteries. This is increased from prior exam. Representative right hilar lymph node measures 22 mm, series 5, image 59, previously 19 mm. This is contiguous with right paratracheal soft tissue densities/adenopathy which is difficult to delineate. Right lower paratracheal node has increased currently 13 mm, series 5, image 52, previously 10 mm. High mediastinal node measures 11 mm, series 5, image 37, unchanged. No left hilar adenopathy. Esophagus slightly patulous without wall thickening. No visualized thyroid nodule. Lungs/Pleura: Spiculated right upper lobe cavitary nodule currently measuring approximately 3 x 2.6 cm with decreased peripheral soft tissue component from prior exam. This is contiguous with soft tissue density that extends to the right hilum. Severe narrowing of the right upper lobe bronchus. Ill-defined paramediastinal density in the right upper lobe is likely post treatment related change. Peripheral subpleural opacity in the right upper lobe abutting, series 7, image 57 is nonspecific, and may represent pulmonary infarct, post treatment related change, or pneumonia. Small left apical nodule series 7, image 39 and 40 are unchanged. Small nodule in the superior segment of the left lower lobe is unchanged, series 7, image 55. 3 mm perifissural nodule in the right middle lobe, series 7, image 64, unchanged. No definite new nodule. No pleural fluid. Upper Abdomen: Known metastatic disease in the liver, not as well visualized on the current exam given phase of contrast. No acute abdominal findings. 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 T3 lesion causing mild pathologic compression fracture, similar to  prior. No new spinal compression fractures. Review of the MIP images confirms the above findings. IMPRESSION: 1. Progressive right hilar adenopathy/soft tissue density which causes severe narrowing of the right upper lobe pulmonary arteries which are attenuated. Small segmental pulmonary emboli in the right lower lobe are adjacent to adenopathy, may represent direct invasion versus did no novo clot. Thromboembolic burden is small. No pulmonary embolus on the left, however breathing motion artifact and contrast bolus timing limits detailed assessment. 2. Spiculated right upper lobe cavitary nodule with decreased peripheral soft tissue component from prior exam and slight decrease in size. 3. Progressive right hilar adenopathy/soft tissue density causes severe narrowing of the right upper lobe bronchus. There may be an element of post treatment related change, however some of the mediastinal nodes have increased in size suspicious for progression in disease. 4. Additional small pulmonary nodules both lungs are unchanged from exam last month. 5. Osseous metastatic disease is grossly unchanged. Known hepatic metastatic disease is not well delineated given phase of contrast These results were called by telephone at the time of interpretation on 06/13/2019 at 1:52 pm to [place] , who verbally acknowledged these results. Electronically Signed   By: Keith Rake M.D.   On: 06/13/2019 13:53  CT CERVICAL SPINE WO CONTRAST  Result Date: 06/17/2019 CLINICAL DATA:  Known brain metastases. Worsening headache. Patient recently started anticoagulation. EXAM: CT HEAD WITHOUT CONTRAST CT CERVICAL SPINE WITHOUT CONTRAST TECHNIQUE: Multidetector CT imaging of the head and cervical spine was performed following the standard protocol without intravenous contrast. Multiplanar CT image reconstructions of the cervical spine were also generated. COMPARISON:  Jun 04, 2019 CT scan of the brain. Brain MRI May 10, 2019.  FINDINGS: CT HEAD FINDINGS Brain: No subdural, epidural, or subarachnoid hemorrhage. Most of the patient's known brain metastases are not visualized on this unenhanced CT scan. There is suggestion of a few metastases, very subtle. No acute cortical ischemia or infarct. Cerebellum, brainstem, and basal cisterns are unremarkable. No midline shift or mass effect identified. Vascular: No hyperdense vessel or unexpected calcification. Skull: Suspected calvarial metastases seen on MRI are not well appreciated on this study. Sinuses/Orbits: Minimal fluid in the posterior right mastoid air cells without bony erosion. There is opacification of the sphenoid sinuses which is worsened in the interval. 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 CT CERVICAL SPINE FINDINGS Alignment: Normal. Skull base and vertebrae: No fractures identified. Relative lucency in the left lamina and pedicle at C2, extending into the spinous process with periosteal reaction. No other discrete bony lesions in the cervical spine. A lucency in the right mandibular head is well-circumscribed. Soft tissues and spinal canal: No prevertebral fluid or swelling. No visible canal hematoma. Disc levels:  Mild multilevel degenerative disc disease. Upper chest: Only the superior most aspect of the right apical mass is seen on this CT. Several small nodules in the left apex are better assessed on today's CT scan of the chest. Other: No other abnormalities. IMPRESSION: 1. 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 There is suggestion of some of the metastases, very subtle. No midline shift. Basal cisterns are patent. 2. 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 3. Suspected calvarial metastases on the previous MRI are not well seen on this study. 4. No fracture or malalignment in the cervical  spine. Lucency in the left C2 lamina and pedicle extending into the spinous process with periosteal reaction suggest metastatic disease in this region. No other definitive bony metastatic disease in the cervical spine. 5. There is a rounded lucency in the right mandibular head which is nonspecific. 6. Nodularity in the lung apices. See the CT scan of the chest for better evaluation of this region. Electronically Signed   By: Dorise Bullion III M.D   On: 06/17/2019 16:37   MR Cervical Spine Wo Contrast  Result Date: 06/17/2019 CLINICAL DATA:  34 year old male with widely metastatic lung cancer. Back pain. EXAM: MRI CERVICAL SPINE WITHOUT CONTRAST TECHNIQUE: Multiplanar, multisequence MR imaging of the cervical spine was performed. No intravenous contrast was administered. COMPARISON:  CT Thoracic Spine, Lumbar Spine, CT Chest, Abdomen, and Pelvis yesterday. Brain MRI 05/10/2019. FINDINGS: Alignment: Overall straightening of cervical lordosis. Vertebrae: Metastatic disease to bone at virtually all cervical spine levels, and throughout the visible skull base. Only the C1 vertebra seems to be spared at this time. Anterior and posterior element metastases in the cervical spine. No pathologic cervical vertebral fracture, although there is epidural and extraosseous extension of tumor from the left C2 posterior elements (series 13, image 4) with severe involvement of the left C3 neural foramen. No  significant cervical spinal stenosis. See thoracic spine findings reported separately. Cord: Spinal cord signal is within normal limits at all visualized levels. No IV contrast administered. Posterior Fossa, vertebral arteries, paraspinal tissues: Cervicomedullary junction is within normal limits. Abnormal T2 and STIR hyperintensity in the visible right cerebellar hemisphere compatible with known brain metastases. No posterior fossa mass effect is evident. Preserved major vascular flow voids in the neck. There is inflammation  of the C2-C3 level posterior paraspinal muscles greater on the left which is probably related to the C2 extraosseous tumor. Disc levels: Incidental degenerative changes including: C3-C4: Small leftward disc herniation with mild spinal stenosis and mild spinal cord mass effect. C4-C5: Disc bulge and small rightward disc herniation with mild spinal stenosis and mild right hemi cord mass effect. Associated moderate to severe right C5 foraminal stenosis. IMPRESSION: 1. Widespread osseous metastatic disease throughout the cervical spine and the visible skull base. 2. Epidural and extraosseous extension of tumor from the left C2 posterior elements with severe involvement of the left C3 neural foramen, but no malignant spinal stenosis. There is mild degenerative cervical spinal stenosis at C3-C4 and C4-C5. 3. No pathologic cervical vertebral fracture. 4. See Thoracic Spine findings reported separately. Electronically Signed   By: Genevie Ann M.D.   On: 06/17/2019 21:59   MR THORACIC SPINE WO CONTRAST  Result Date: 06/17/2019 CLINICAL DATA:  34 year old male with widely metastatic lung cancer. Back pain. EXAM: MRI THORACIC SPINE WITHOUT CONTRAST TECHNIQUE: Multiplanar, multisequence MR imaging of the thoracic spine was performed. No intravenous contrast was administered. COMPARISON:  Cervical MRI today reported separately. Thoracic spine CT, CT Chest, Abdomen, and Pelvis yesterday. FINDINGS: Limited cervical spine imaging:  Reported separately today. Thoracic spine segmentation:  Normal. Alignment:  Straightening of thoracic kyphosis. Vertebrae: Thoracic vertebral metastases at every level. Subtotal T1 vertebral replacement by tumor. Early extraosseous extension from the spinous process into the paraspinal muscles. T3 vertebral body pathologic compression fracture with 50% central loss of vertebral body height, and some retropulsed bone/tumor. Additionally, there is evidence of some epidural tumor extension at that level  (series 34, image 15) resulting in mild spinal stenosis, borderline to mild cord compression. Moderate left T3 neural foraminal stenosis. Epidural tumor also at the T5 level where the vertebra is completely replaced by tumor. Abundant extraosseous extension of tumor also from the posterior elements into the posterior paraspinal musculature. Mild spinal stenosis and borderline to mild cord compression. T6 near complete vertebral replacement by tumor with ventral and foraminal epidural tumor extension. When superimposed on epidural lipomatosis there is mild spinal stenosis and cord compression (series 34, image 28). Moderate to severe right T6 foraminal involvement. Ventral epidural tumor extension at T7 without cord compression at this time (series 34, image 32). Ventral epidural tumor extension at T9 with mild spinal stenosis and borderline to mild cord compression at this time (image 42). T10 right side posterior element extraosseous extension of tumor into the paraspinal muscles. Cord: No thoracic cord signal abnormality despite the multiple levels of epidural tumor. Paraspinal and other soft tissues: Abnormal right upper lung and mediastinum. Small layering right pleural effusion. Partially visible liver metastases. Disc levels: There are some superimposed thoracic degenerative changes, including: -Mild degenerative thoracic spinal stenosis at T8-T9 related to left eccentric disc and endplate degeneration, mild to moderate posterior element degeneration. -borderline to mild degenerative spinal stenosis at T11-T12 related to right paracentral disc protrusion and mild facet hypertrophy. IMPRESSION: 1. Bone metastases involving every thoracic spine level. Pathologic fracture of T3 with  up to 50% central vertebral loss of height, mild retropulsion of bone in tumor. 2. Ventral epidural tumor with up to mild spinal stenosis and borderline to mild cord compression at: T3, T5, T6, T7, T9. 3. Moderate or severe neural  foraminal involvement by tumor at the left T3, right T6 nerve levels. 4. Superimposed mild degenerative thoracic spinal stenosis at T8-T9 and T11-T12. Electronically Signed   By: Genevie Ann M.D.   On: 06/17/2019 22:09   MR LUMBAR SPINE WO CONTRAST  Result Date: 06/17/2019 CLINICAL DATA:  34 year old male with widely metastatic lung cancer. Back pain. EXAM: MRI LUMBAR SPINE WITHOUT CONTRAST TECHNIQUE: Multiplanar, multisequence MR imaging of the lumbar spine was performed. No intravenous contrast was administered. COMPARISON:  Thoracic spine MRI today. Lumbar CT, CT Chest, Abdomen, and Pelvis yesterday. Lumbar MRI 05/10/2019. FINDINGS: Segmentation: Normal, concordant with the thoracic spine numbering today. Alignment:  Stable lumbar lordosis since April. Vertebrae: Lumbar metastatic disease at all levels. Compared to 05/10/2019 lumbar vertebral tumor shows mild generalized progression. Confluent sacral and medial iliac bone metastatic disease redemonstrated. Mild pathologic fracture of the L2 superior endplate. This has only mildly progressed since April (series 38, image 9). No other lumbar pathologic fracture. And although there is some early lumbar extraosseous tumor extension (right L4 transverse process series 39, image 25), there is no significant lumbar epidural or neural foraminal tumor at this time. However, there is obliteration of the right S1, left S2 and S3 neural foramina by tumor. Conus medullaris and cauda equina: Conus extends to the T12-L1 level. No lower spinal cord or conus signal abnormality. Cauda equina nerve roots appear normal in the absence of contrast. Paraspinal and other soft tissues: Stable visible abdominal viscera. Disc levels: Lumbar epidural lipomatosis. Superimposed lumbar degenerative changes most notable for: -L3-L4 left paracentral disc protrusion superimposed on disc bulge, posterior element hypertrophy and epidural lipomatosis. Moderate left lateral recess stenosis (left L4  nerve level) and mild spinal stenosis. IMPRESSION: 1. Lumbar metastatic disease at all levels with mild generalized progression since the April MRI. Mild pathologic fracture of the L2 superior endplate. 2. Occasional early extraosseous extension of tumor (right L4 transverse process), but no lumbar epidural or neural foraminal tumor at this time. 3. Bulky sacral tumor re-demonstrated including severe involvement of the right S1, left S2, and left S3 neural foramina. Electronically Signed   By: Genevie Ann M.D.   On: 06/17/2019 22:16   US Abdomen Complete  Result Date: 06/21/2019 CLINICAL DATA:  Postprandial abdominal pain. Metastatic lung cancer. EXAM: ABDOMEN ULTRASOUND COMPLETE COMPARISON:  CT abdomen pelvis 05/10/2019 FINDINGS: Gallbladder: No gallstones or wall thickening visualized. No sonographic Murphy sign noted by sonographer. Common bile duct: Diameter: 2.7 mm Liver: Liver parenchyma demonstrates increased echogenicity diffusely. Multiple liver masses are present compatible metastatic disease as noted on prior CT. Largest lesions in the right lobe measure 3.7 x 2.6 x 1.7 cm, and 3.8 x 3.5 x 3.5 cm. Multiple smaller lesions also present in the liver. Portal vein is patent on color Doppler imaging with normal direction of blood flow towards the liver. IVC: No abnormality visualized. Pancreas: Visualized portion unremarkable. Spleen: Size and appearance within normal limits. Right Kidney: Length: 9.7 cm. Echogenicity within normal limits. No mass or hydronephrosis visualized. Left Kidney: Length: 10.4 cm. Echogenicity within normal limits. No mass or hydronephrosis visualized. Abdominal aorta: No aneurysm visualized. Other findings: Small right pleural effusion IMPRESSION: Negative for gallstones Multiple hepatic metastasis as noted on CT. Fatty infiltration liver is noted. Small right  pleural effusion. Electronically Signed   By: Franchot Gallo M.D.   On: 06/21/2019 09:08   CT ABDOMEN PELVIS W  CONTRAST  Result Date: 06/17/2019 CLINICAL DATA:  Metastatic lung cancer, chest pain, lower and mid back pain EXAM: CT ABDOMEN AND PELVIS WITH CONTRAST TECHNIQUE: Multidetector CT imaging of the abdomen and pelvis was performed using the standard protocol following bolus administration of intravenous contrast. CONTRAST:  147mL OMNIPAQUE IOHEXOL 350 MG/ML SOLN COMPARISON:  05/10/2019 FINDINGS: Lower chest: Please refer to corresponding chest CT report describing right upper lobe malignancy, right hilar and mediastinal adenopathy, and chronic right lower lobe pulmonary emboli. Hepatobiliary: Innumerable hepatic metastases are again noted, increased in size. Largest index lesion within the caudate measures 4.9 cm image 18, previously measuring 2.8 cm image 47. No biliary dilation.  Gallbladder is unremarkable. Pancreas: Unremarkable. No pancreatic ductal dilatation or surrounding inflammatory changes. Spleen: Normal in size without focal abnormality. Adrenals/Urinary Tract: Adrenal glands are unremarkable. Kidneys are normal, without renal calculi, focal lesion, or hydronephrosis. Bladder is unremarkable. Stomach/Bowel: No bowel obstruction or ileus. No bowel wall thickening or inflammatory changes. Vascular/Lymphatic: No pathologic adenopathy within the abdomen or pelvis. Vascular structures are unremarkable. Reproductive: Prostate is unremarkable. Other: No free fluid or free gas.  No abdominal wall hernia. Musculoskeletal: Progressive diffuse bony metastases are seen throughout the bilateral hips, lumbar spine, sacrum, and bony pelvis. Please refer to dedicated CT lumbar spine describing findings related to the lumbar spine and sacrum. Increased size of the expansile left iliac crest lesion with associated soft tissue component. There are no pathologic fractures. IMPRESSION: 1. Progressive liver metastases as above. 2. Progressive bony metastases involving the lumbar spine, bony pelvis, and sacrum. Please refer to  corresponding CT lumbar spine report for important findings in that region. Electronically Signed   By: Randa Ngo M.D.   On: 06/17/2019 16:46   CT T-SPINE NO CHARGE  Result Date: 06/17/2019 CLINICAL DATA:  Metastatic lung cancer, back pain EXAM: CT THORACIC SPINE WITHOUT CONTRAST TECHNIQUE: Multidetector CT images of the thoracic were obtained using the standard protocol without intravenous contrast. COMPARISON:  06/13/2019 FINDINGS: Alignment: Alignment is anatomic. Vertebrae: Diffuse metastatic disease is seen throughout the entirety of the thoracic spine. There is moderate appearance and destruction of the T3 vertebral body with pathologic fracture centrally with 50% loss of vertebral body height. Paraspinal and other soft tissues: Please refer to corresponding chest CT report describing a cavitating right upper lobe mass, right hilar mass, and diffuse mediastinal adenopathy. Trace bilateral pleural effusions are noted. There are chronic right lower lobe segmental pulmonary emboli. Disc levels: As result of the T3 pathologic fracture and metastatic disease described above, there is approximately 50% narrowing of the central canal at T3. At T8 and T9, there are expansile metastatic lesions resulting in bowing of the posterior margin of the vertebral bodies with estimated 50-75% narrowing of the central canal and likely cord compression. IMPRESSION: 1. Diffuse bony metastases throughout the entirety of the thoracic spine, most pronounced at T3, T8, and T9 with resulting central canal narrowing and likely cord compression. 2. Pathologic fracture of the T3 vertebral body, with 50% loss of height centrally. 3. Please refer to CT chest report describing cavitating right upper lobe mass, right hilar mass, and mediastinal adenopathy. Electronically Signed   By: Randa Ngo M.D.   On: 06/17/2019 16:40   CT L-SPINE NO CHARGE  Result Date: 06/17/2019 CLINICAL DATA:  Low back pain, metastatic lung cancer  EXAM: CT LUMBAR SPINE WITHOUT  CONTRAST TECHNIQUE: Multidetector CT imaging of the lumbar spine was performed without intravenous contrast administration. Multiplanar CT image reconstructions were also generated. COMPARISON:  06/13/2019 FINDINGS: Segmentation: There are 5 non-rib-bearing lumbar type vertebral bodies. Alignment: Alignment is anatomic. Vertebrae: There is diffuse metastatic disease, with multiple lytic lesions throughout the lumbar spine. These are most pronounced within the left aspect of the L1 vertebral body extending into the left pedicle, anterior aspect of the L2 vertebral body, right L3 transverse process and central aspect L3 vertebral body, right L4 pedicle and transverse process. Additional lytic lesions are seen within the bilateral iliac bones and sacrum. Bony destruction and soft tissue mass obliterate the left S2/S3 and S3/S4 neural foramen. I do not see any pathologic fracture within the lumbar spine. Paraspinal and other soft tissues: Soft tissue mass results in obliteration of the left S2/S3 and S3/S4 neural foramen. Likely soft tissue component arising from the L1 metastatic lesion with slight enlargement of the left psoas muscle. Disc levels: There is multilevel lumbar spondylosis. Mild central canal stenosis is seen at L1/L2, L2/L3, and L3/L4. At L3/L4 there is left predominant lateral recess and neural foraminal encroachment as well. IMPRESSION: 1. Diffuse bony metastatic disease throughout the lumbar spine, sacrum, and bilateral iliac bones as above. 2. There are no pathologic fractures. Soft tissue mass and bony destruction obliterate the left S2/S3 and S3/S4 neural foramen. 3. Multilevel lumbar spondylosis most pronounced at L3/L4 with left predominant neural foraminal and lateral recess encroachment. Electronically Signed   By: Randa Ngo M.D.   On: 06/17/2019 16:35   DG Chest Portable 1 View  Result Date: 06/13/2019 CLINICAL DATA:  Shortness of breath and chest pain  EXAM: PORTABLE CHEST 1 VIEW COMPARISON:  Jun 02, 2019 FINDINGS: Port-A-Cath tip is in the superior vena cava. No pneumothorax. Lungs are clear. Heart size and pulmonary vascularity are normal. No adenopathy. No bone lesions. IMPRESSION: Port-A-Cath tip in superior vena cava. No pneumothorax. Lungs clear. Cardiac silhouette within normal limits. Electronically Signed   By: Lowella Grip III M.D.   On: 06/13/2019 10:54   DG Chest Portable 1 View  Result Date: 06/02/2019 CLINICAL DATA:  Hervey Ard right-sided chest pain EXAM: PORTABLE CHEST 1 VIEW COMPARISON:  May 06, 2019 FINDINGS: The previously demonstrated right upper lobe pulmonary nodule is better visualized on the patient's prior CT chest and prior x-rays. There is no pneumothorax. No significant pleural effusion. No evidence for an acute osseous abnormality. The heart size is normal. There is some mild thickening of the right paratracheal stripe which may be secondary to underlying mediastinal adenopathy. IMPRESSION: 1. Mild thickening of the right paratracheal stripe which may be secondary to underlying mediastinal adenopathy. 2. The previously demonstrated right upper lobe pulmonary nodule is better visualized on the patient's prior CT chest and prior x-rays. 3. No acute cardiopulmonary process. Electronically Signed   By: Constance Holster M.D.   On: 06/02/2019 17:55   DG Femur Min 2 Views Left  Result Date: 06/04/2019 CLINICAL DATA:  Lower extremity pain. EXAM: LEFT FEMUR 2 VIEWS COMPARISON:  None. FINDINGS: Frontal and lateral views were obtained. No fracture or dislocation. No abnormal periosteal reaction. No blastic or lytic bone lesions. There is moderate degenerative change in the knee joint. No appreciable knee joint effusion. IMPRESSION: No blastic or lytic bone lesions. No abnormal periosteal reaction. No fracture or dislocation. 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 Electronically Signed   By: Lowella Grip III M.D.   On:  06/04/2019 08:55  IR IMAGING GUIDED PORT INSERTION  Result Date: 06/12/2019 INDICATION: 34 year old male with a history of metastatic right upper lobe lung cancer. He presents for port catheter placement. EXAM: IMPLANTED PORT A CATH PLACEMENT WITH ULTRASOUND AND FLUOROSCOPIC GUIDANCE MEDICATIONS: Ancef 2 g; The antibiotic was administered within an appropriate time interval prior to skin puncture. ANESTHESIA/SEDATION: Versed 4 mg IV; Fentanyl 100 mcg IV; Moderate Sedation Time:  19 minutes The patient was continuously monitored during the procedure by the interventional radiology nurse under my direct supervision. FLUOROSCOPY TIME:  0 minutes, 30 seconds (7 mGy) COMPLICATIONS: None immediate. PROCEDURE: The right neck and chest was prepped with chlorhexidine, and draped in the usual sterile fashion using maximum barrier technique (cap and mask, sterile gown, sterile gloves, large sterile sheet, hand hygiene and cutaneous antiseptic). Local anesthesia was attained by infiltration with 1% lidocaine with epinephrine. Ultrasound demonstrated patency of the right internal jugular vein, and this was documented with an image. Under real-time ultrasound guidance, this vein was accessed with a 21 gauge micropuncture needle and image documentation was performed. A small dermatotomy was made at the access site with an 11 scalpel. A 0.018" wire was advanced into the SVC and the access needle exchanged for a 29F micropuncture vascular sheath. The 0.018" wire was then removed and a 0.035" wire advanced into the IVC. An appropriate location for the subcutaneous reservoir was selected below the clavicle and an incision was made through the skin and underlying soft tissues. The subcutaneous tissues were then dissected using a combination of blunt and sharp surgical technique and a pocket was formed. A single lumen power injectable portacatheter was then tunneled through the subcutaneous tissues from the pocket to the dermatotomy  and the port reservoir placed within the subcutaneous pocket. The venous access site was then serially dilated and a peel away vascular sheath placed over the wire. The wire was removed and the port catheter advanced into position under fluoroscopic guidance. The catheter tip is positioned in the superior cavoatrial junction. This was documented with a spot image. The portacatheter was then tested and found to flush and aspirate well. The port was flushed with saline followed by 100 units/mL heparinized saline. The pocket was then closed in two layers using first subdermal inverted interrupted absorbable sutures followed by a running subcuticular suture. The epidermis was then sealed with Dermabond. The dermatotomy at the venous access site was also closed with Dermabond. IMPRESSION: Successful placement of a right IJ approach Power Port with ultrasound and fluoroscopic guidance. The catheter is ready for use. Electronically Signed   By: Jacqulynn Cadet M.D.   On: 06/12/2019 16:26   VAS [place] LOWER EXTREMITY VENOUS (DVT) (ONLY [facility] & [facility])  Result Date: 06/04/2019  Lower Venous DVTStudy Indications: Swelling.  Risk Factors: None identified. Limitations: Poor ultrasound/tissue interface. Comparison Study: No prior studies. Performing Technologist: Oliver Hum RVT  Examination Guidelines: A complete evaluation includes B-mode imaging, spectral Doppler, color Doppler, and power Doppler as needed of all accessible portions of each vessel. Bilateral testing is considered an integral part of a complete examination. Limited examinations for reoccurring indications may be performed as noted. The reflux portion of the exam is performed with the patient in reverse Trendelenburg.  +-----+---------------+---------+-----------+----------+--------------+  RIGHT Compressibility Phasicity Spontaneity Properties Thrombus Aging  +-----+---------------+---------+-----------+----------+--------------+  CFV   Full            Yes        Yes                                    +-----+---------------+---------+-----------+----------+--------------+   +---------+---------------+---------+-----------+----------+--------------+  LEFT      Compressibility Phasicity Spontaneity Properties Thrombus Aging  +---------+---------------+---------+-----------+----------+--------------+  CFV       Full            Yes       Yes                                    +---------+---------------+---------+-----------+----------+--------------+  SFJ       Full                                                             +---------+---------------+---------+-----------+----------+--------------+  FV Prox   Full                                                             +---------+---------------+---------+-----------+----------+--------------+  FV Mid    Full                                                             +---------+---------------+---------+-----------+----------+--------------+  FV Distal Full                                                             +---------+---------------+---------+-----------+----------+--------------+  PFV       Full                                                             +---------+---------------+---------+-----------+----------+--------------+  POP       None            No        No                     Acute           +---------+---------------+---------+-----------+----------+--------------+  PTV       None                                             Acute           +---------+---------------+---------+-----------+----------+--------------+  PERO      None                                             Acute           +---------+---------------+---------+-----------+----------+--------------+  Gastroc   None                                             Acute           +---------+---------------+---------+-----------+----------+--------------+     Summary: RIGHT: - No evidence of common femoral vein obstruction.  LEFT: - Findings  consistent with acute deep vein thrombosis involving the left popliteal vein, left posterior tibial veins, left peroneal veins, and left gastrocnemius veins. - No cystic structure found in the popliteal fossa.  *See table(s) above for measurements and observations. Electronically signed by Deitra Mayo MD on 06/04/2019 at 12:00:49 PM.    Final     ASSESSMENT AND PLAN: William Ali 34 y.o. male with medical history significant for metastatic adenocarcinoma of the lung who presents for re-admission with intractable pain.  Unfortunately this marked the second admission for William Ali due to intractable pain.  We appreciate the assistance of palliative care in attempting to better control his pain.    In terms of his cancer care, he has received C1D1 of Carbo/pemetrexed on 06/23/2019. He has tolerated dose 1 well with no clear complications. His labwork has remained stable with normal kidney function and stably low Hgb. His LFTs have remained elevated, but no increased. He will require treatment again in 3 weeks time, with the addition of pembrolizumab at that time.   In the interim, pain control has been this patient's major issue and the primary reason we have no been able to d/c him from the inpatient setting. Ideally the patient would need to be off IV pain medication x 24-48 hours prior to D/c. Given the high doses of pain medication required we appreciate the input and assistance of palliative care in dose adjustments. Other than the pain control there are no clear indications to keep the patient in house.  Marland Kitchen  #Metastatic Adenocarcinoma of the Lung #Intractable Pain of Head/Spine #RLL PE and LLE DVT --appreciate the assistance of the multiple disciplines involved, including hospitalist, radiation oncology and palliative care --Agree with current pain medication regimen, appreciate palliative recommendations for transitioning to a PO regimen.  --systemic therapy with Carboplatin/Pemetrexed  administered on Friday 06/23/2019 while inpatient. This appears to have been tolerated well. Anticipate next cycle in 3 weeks time with the addition of pembrolizumab. --repeat screening for targetable mutations with [employer] in the outpatient setting  --Oncology will continue to follow.    Ledell Peoples, MD Department of Hematology/Oncology [place] [facility] Phone: [phone] Pager: [phone] Email: [email or web address]

## 2019-06-28 ENCOUNTER — Encounter (HOSPITAL_COMMUNITY): Payer: Self-pay | Admitting: Hematology and Oncology

## 2019-06-28 ENCOUNTER — Other Ambulatory Visit (HOSPITAL_COMMUNITY): Payer: Self-pay | Admitting: Internal Medicine

## 2019-06-28 ENCOUNTER — Encounter: Payer: Self-pay | Admitting: Hematology and Oncology

## 2019-06-28 LAB — COMPREHENSIVE METABOLIC PANEL
ALT: 166 U/L — ABNORMAL HIGH (ref 0–44)
AST: 61 U/L — ABNORMAL HIGH (ref 15–41)
Albumin: 2.9 g/dL — ABNORMAL LOW (ref 3.5–5.0)
Alkaline Phosphatase: 345 U/L — ABNORMAL HIGH (ref 38–126)
Anion gap: 9 (ref 5–15)
BUN: 25 mg/dL — ABNORMAL HIGH (ref 6–20)
CO2: 26 mmol/L (ref 22–32)
Calcium: 8.3 mg/dL — ABNORMAL LOW (ref 8.9–10.3)
Chloride: 102 mmol/L (ref 98–111)
Creatinine, Ser: 1.16 mg/dL (ref 0.61–1.24)
GFR calc Af Amer: >60 mL/min (ref >60)
GFR calc non Af Amer: >60 mL/min (ref >60)
Glucose, Bld: 154 mg/dL — ABNORMAL HIGH (ref 70–99)
Potassium: 4.7 mmol/L (ref 3.5–5.1)
Sodium: 137 mmol/L (ref 135–145)
Total Bilirubin: 0.7 mg/dL (ref 0.3–1.2)
Total Protein: 5.8 g/dL — ABNORMAL LOW (ref 6.5–8.1)

## 2019-06-28 LAB — GASTROINTESTINAL PANEL BY PCR, STOOL (REPLACES STOOL CULTURE)

## 2019-06-28 LAB — CBC WITH DIFFERENTIAL/PLATELET
Abs Immature Granulocytes: 0.02 10*3/uL (ref 0.00–0.07)
Basophils Absolute: 0 10*3/uL (ref 0.0–0.1)
Basophils Relative: 0 %
Eosinophils Absolute: 0 10*3/uL (ref 0.0–0.5)
Eosinophils Relative: 0 %
HCT: 29.8 % — ABNORMAL LOW (ref 39.0–52.0)
Hemoglobin: 9.4 g/dL — ABNORMAL LOW (ref 13.0–17.0)
Immature Granulocytes: 0 %
Lymphocytes Relative: 1 %
Lymphs Abs: 0.1 10*3/uL — ABNORMAL LOW (ref 0.7–4.0)
MCH: 27.3 pg (ref 26.0–34.0)
MCHC: 31.5 g/dL (ref 30.0–36.0)
MCV: 86.6 fL (ref 80.0–100.0)
Monocytes Absolute: 0.1 10*3/uL (ref 0.1–1.0)
Monocytes Relative: 2 %
Neutro Abs: 5.1 10*3/uL (ref 1.7–7.7)
Neutrophils Relative %: 97 %
Platelets: 118 10*3/uL — ABNORMAL LOW (ref 150–400)
RBC: 3.44 MIL/uL — ABNORMAL LOW (ref 4.22–5.81)
RDW: 17.2 % — ABNORMAL HIGH (ref 11.5–15.5)
WBC: 5.3 10*3/uL (ref 4.0–10.5)
nRBC: 0 % (ref 0.0–0.2)

## 2019-06-28 MED ORDER — NYSTATIN 100000 UNIT/ML MT SUSP: 5.0000 mL | Freq: Four times a day (QID) | OROMUCOSAL | 0 refills | Status: DC

## 2019-06-28 MED ORDER — HEPARIN SOD (PORK) LOCK FLUSH 100 UNIT/ML IV SOLN
500.0000 [IU] | INTRAVENOUS | Status: DC | PRN
  Filled 2019-06-28: qty 5

## 2019-06-28 MED ORDER — AMPHETAMINE-DEXTROAMPHETAMINE 10 MG PO TABS: ORAL_TABLET | ORAL | 0 refills | Status: DC

## 2019-06-28 MED ORDER — CELECOXIB 200 MG PO CAPS: 200.0000 mg | ORAL_CAPSULE | Freq: Every day | ORAL | 0 refills | Status: DC

## 2019-06-28 MED ORDER — ZOLPIDEM TARTRATE 10 MG PO TABS: 10.0000 mg | ORAL_TABLET | Freq: Every evening | ORAL | 0 refills | Status: DC | PRN

## 2019-06-28 MED ORDER — HYDROMORPHONE HCL 8 MG PO TABS: ORAL_TABLET | ORAL | 0 refills | Status: DC

## 2019-06-28 MED ORDER — DULOXETINE HCL 30 MG PO CPEP: 30.0000 mg | ORAL_CAPSULE | Freq: Every day | ORAL | 0 refills | Status: DC

## 2019-06-28 MED ORDER — METOPROLOL TARTRATE 25 MG PO TABS: 25.0000 mg | ORAL_TABLET | Freq: Two times a day (BID) | ORAL | 0 refills | Status: DC

## 2019-06-28 MED ORDER — SENNOSIDES-DOCUSATE SODIUM 8.6-50 MG PO TABS
2.0000 | ORAL_TABLET | Freq: Two times a day (BID) | ORAL | 0 refills | Status: DC
Start: 2019-06-28 — End: 2019-10-28

## 2019-06-28 MED ORDER — METOPROLOL SUCCINATE ER 50 MG PO TB24
50.0000 mg | ORAL_TABLET | Freq: Every day | ORAL | 11 refills | Status: DC
Start: 2019-06-28 — End: 2019-08-23

## 2019-06-28 MED ORDER — FENTANYL 100 MCG/HR TD PT72: 1.0000 | MEDICATED_PATCH | TRANSDERMAL | 0 refills | Status: DC

## 2019-06-28 MED ORDER — HEPARIN SOD (PORK) LOCK FLUSH 100 UNIT/ML IV SOLN: 500.0000 [IU] | INTRAVENOUS | Status: DC

## 2019-06-28 MED ORDER — DEXAMETHASONE 4 MG PO TABS
4.0000 mg | ORAL_TABLET | Freq: Two times a day (BID) | ORAL | 0 refills | Status: DC
Start: 2019-06-28 — End: 2019-08-22

## 2019-06-28 MED ORDER — ONDANSETRON HCL 4 MG PO TABS: 4.0000 mg | ORAL_TABLET | Freq: Three times a day (TID) | ORAL | 0 refills | Status: DC | PRN

## 2019-06-28 MED ORDER — POLYETHYLENE GLYCOL 3350 17 G PO PACK: 17.0000 g | PACK | Freq: Every day | ORAL | 0 refills | Status: DC

## 2019-06-28 MED FILL — AMPHETAMINE-DEXTROAMPHETAMI: 10 | 30 days supply | Qty: 60 | Fill #0

## 2019-06-28 MED FILL — ONDANSETRON HCL 4 MG TABS: 4 | 20 days supply | Qty: 60 | Fill #0

## 2019-06-28 MED FILL — NYSTATIN 100,000 UNITS/ML S: 100000 | 3 days supply | Qty: 60 | Fill #0

## 2019-06-28 MED FILL — DULoxetine HCL 30 MG CPEP: 30 | 30 days supply | Qty: 30 | Fill #0

## 2019-06-28 MED FILL — METOPROLOL SUCCINATE ER 50: 50 | 30 days supply | Qty: 30 | Fill #0

## 2019-06-28 MED FILL — fentaNYL 100 MCG/HR PT72: 100 | 30 days supply | Qty: 10 | Fill #0

## 2019-06-28 MED FILL — ZOLPIDEM TARTRATE 10 MG TAB: 10 | 15 days supply | Qty: 15 | Fill #0

## 2019-06-28 NOTE — TOC Transition Note (Signed)
Transition of Care [facility]) - CM/SW Discharge Note   Patient Details  Name: William Ali MRN: [id number] Date of Birth: October 27, 1985  Transition of Care [facility]) CM/SW Contact:  Lynnell Catalan, RN Phone Number: 06/28/2019, 1:03 PM   Clinical Narrative:    This CM contacted [facility] outpatient pharmacy about dc medications. Hydromorphone and Celebrex require prior auth from MD. MD made aware. Fentanyl patches will be in stock at pharmacy tomorrow. He is not due to change patches until tomorrow. Wife to be informed to get patches tomorrow.     Barriers to Discharge: Continued Medical Work up   Patient Goals and CMS Choice Patient states their goals for this hospitalization and ongoing recovery are:: to go home    Discharge Plan and Services   Discharge Planning Services: CM Consult            DME Arranged: Gilford Rile rolling, 3-N-1 DME Agency: [employer] Date DME Agency Contacted: 06/19/19 Time DME Agency Contacted: [id number] Representative spoke with at DME Agency: [place] ([place]) Interventions     Readmission Risk Interventions Readmission Risk Prevention Plan 06/19/2019 06/14/2019  Transportation Screening Complete Complete  Medication Review Press photographer) Complete Complete  PCP or Specialist appointment within 3-5 days of discharge Complete Complete  HRI or [place] Complete Complete  SW Recovery Care/Counseling Consult Complete Complete  Palliative Care Screening Not Applicable Not [place] Not Applicable Not Applicable  Some recent data might be hidden

## 2019-06-28 NOTE — Discharge Summary (Signed)
Physician Discharge Summary  William Ali ATF:[id number] DOB: 06/08/1985 DOA: 06/17/2019  PCP: Patient, No Pcp Per  Admit date: 06/17/2019 Discharge date: 06/28/2019  Admitted From: Home Disposition: Home  Recommendations for Outpatient Follow-up:  1. Follow up with PCP in 1-2 weeks 2. Follow-up with palliative care in 2 weeks 3. Please obtain BMP/CBC in one week 4. Please follow up with your PCP on the following pending results: Unresulted Labs (From admission, onward)    Start     Ordered   06/26/19 1732  Gastrointestinal Panel by PCR , Stool  (Gastrointestinal Panel by PCR, Stool                                                                                                                                                     *Does Not include CLOSTRIDIUM DIFFICILE testing.**If CDIFF testing is needed, select the C Difficile Quick Screen w PCR reflex order below)  Once,   R     06/26/19 1731   06/24/19 0500  CBC with Differential/Platelet  Daily,   R    Question:  Specimen collection method  Answer:  IV Team=IV Team collect   06/23/19 1512   Unscheduled  CBC with Differential ([place])  STAT     06/23/19 0812   Unscheduled  CMP ([place] only)  STAT     06/23/19 0812   Unscheduled  T4  STAT     06/23/19 0812   Unscheduled  TSH  STAT     06/23/19 0812           Home Health: Yes Equipment/Devices: Commode  Discharge Condition: Stable CODE STATUS: Full code Diet recommendation: Cardiac   subjective: Seen and examined.  Pain controlled.  Feels much better.  Wants to go home as today is his birthday.  Brief/Interim Summary: Srihaan J Carsonis a 34 y.o.maleveryunfortunate young manwith medical history significant ofmetastatic lung cancer who presented to the emergency department with complaints of headache,chest pain,back pain, abdominal pain. He was bothered by pain in upper back and chest. He was also having abdominal discomfort and severe headache. There  is no report of fever, chills, nausea, vomiting, dysuria or shortness of breath.He has been currently treated with radiation therapy and is starting on chemotherapy soon. He was diagnosed with DVT on 06/04/2019 and was diagnosed with PE on his last admission.He was treated with heparin drip for his PE and was discharged on Xarelto. He also recently had a Chemo-Port placement on 06/12/2019. On presentation, he was in sinus tachycardia, quite hypertensive most likely secondary to pain.X-rays show continued extensive, expanding tumor burden. 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 Oncology and radiation oncology consulted.  PMT consulted for cancer related pain  management.  Patient received his first dose of chemo (pemetrexed and carboplatin) on 06/23/2019 without any issues and next dose will be given in 3 weeks.  He also developed oral candidiasis which was treated with antifungal.  Rest of the medical issues remained stable.  Finally his pain is under control managed by palliative care.  Today is his birthday and he wants to go home.  Dr. Hilma Favors from [facility] has sent all the medications to his pharmacy.  He will receive them before leaving today.   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   [place]: Confirm  PDL-1 expression >/= 50%. Required labs: TSH baseline and every 3rd cycle, CMET/CBC with each cycle. May treat up to 24 months for both indications.     Allergies as of 06/28/2019      Reactions   Onion Anaphylaxis   Other    All sea food causes swelling and a rash.     Peanut Oil Itching   Peanut-containing Drug Products Itching      Medication List    STOP taking these medications   alum & mag hydroxide-simeth 200-200-20 MG/5ML suspension Commonly known as: MAALOX/MYLANTA   chlorpheniramine-HYDROcodone 10-8 MG/5ML Suer Commonly known as: TUSSIONEX     TAKE these medications   albuterol 108 (90 Base) MCG/ACT inhaler Commonly known as: VENTOLIN HFA Inhale 2 puffs into the lungs every 6 (six) hours as needed for wheezing or shortness of breath.   amphetamine-dextroamphetamine 10 MG tablet Commonly known as: ADDERALL Take 10 mg by mouth every morning, may take 15m additional dose daily as needed for fatigue   celecoxib 200 MG capsule Commonly known as: CELEBREX Take 1 capsule (200 mg total) by mouth daily.   dexamethasone 4 MG tablet Commonly known as: DECADRON Take 1 tablet (4 mg total) by mouth 2 (two) times daily with a meal. Starting on 5/11, and drop dose to once daily on 5/14   DULoxetine 30 MG capsule Commonly known as: CYMBALTA Take 1 capsule (30 mg total) by mouth daily.   fentaNYL 100 MCG/HR Commonly known as: DAmboy1 patch onto the skin every 3 (three) days. Start taking on: June 3, 20211  folic acid 1 MG tablet Commonly known as: FOLVITE Take 1 tablet (1 mg total) by mouth daily.   HYDROmorphone 8 MG tablet Commonly known as: Dilaudid Take 1/2- 1 tablet every 4 hours as needed for pain What changed:   medication strength  additional instructions   metoprolol succinate 50 MG 24 hr tablet Commonly known as: Toprol XL Take 1 tablet (50 mg total) by mouth daily. Take with or immediately following a meal.   metoprolol tartrate 25 MG  tablet Commonly known as: LOPRESSOR Take 1 tablet (25 mg total) by mouth 2 (two) times daily. What changed: how much to take   nystatin 100000 UNIT/ML suspension Commonly known as: MYCOSTATIN Take 5 mLs (500,000 Units total) by mouth 4 (four) times daily.   ondansetron 4 MG tablet Commonly  known as: ZOFRAN Take 1 tablet (4 mg total) by mouth every 8 (eight) hours as needed for nausea or vomiting.   pantoprazole 40 MG tablet Commonly known as: PROTONIX Take 1 tablet (40 mg total) by mouth 2 (two) times daily.   polyethylene glycol 17 g packet Commonly known as: MIRALAX / GLYCOLAX Take 17 g by mouth daily.   Rivaroxaban Stater Pack (15 mg and 20 mg) Commonly known as: XARELTO STARTER PACK Follow package directions: Take one 38m tablet by mouth twice a day. On day 22, switch to one 260mtablet once a day. Take with food.   senna-docusate 8.6-50 MG tablet Commonly known as: Senokot-S Take 2 tablets by mouth 2 (two) times daily.   zolpidem 10 MG tablet Commonly known as: AMBIEN Take 1 tablet (10 mg total) by mouth at bedtime as needed for up to 15 days for sleep.            Durable Medical Equipment  (From admission, onward)         Start     Ordered   06/19/19 1533  For home use only DME Walker rolling  Once    Question Answer Comment  Walker: With 5 Inch Wheels   Patient needs a walker to treat with the following condition Ambulatory dysfunction      06/19/19 1532   06/19/19 1533  For home use only DME 3 n 1  Once     06/19/19 1532         Follow-up Information    [place]   Why: you have an appointment on June 17 at 11 am to establish primary care.  Contact information: [id number] Follow up in 2 week(s).   Specialty: Internal Medicine Why: Virtual Visit in [place]: [id number] [phone]        Allergies  Allergen Reactions  . Onion Anaphylaxis  . Other     All sea food causes swelling and a rash.    . Peanut Oil Itching  . Peanut-Containing Drug Products Itching    Consultations: [facility]   Procedures/Studies: DG Chest 2 View  Result Date: 06/17/2019 CLINICAL DATA:  Chest pain, history of prior PE EXAM: CHEST - 2 VIEW COMPARISON:  06/13/2019 FINDINGS: Cardiac shadow is within normal limits. Left lung is clear. Fullness in the right hilum and suprahilar region is noted with volume loss in the upper lobe consistent with the known mass lesion. Right chest wall port is seen in satisfactory position. IMPRESSION: Increasing fullness in the right hilar and suprahilar region with volume loss in the upper lobe slightly progressed when compared with the prior exam. Electronically Signed   By: MaInez Catalina.D.   On: 06/17/2019 14:58   DG Tibia/Fibula Left  Result Date: 06/04/2019 CLINICAL DATA:  Leg pain EXAM: LEFT TIBIA AND FIBULA - 2 VIEW COMPARISON:  None. FINDINGS: No fracture of the tibia or fibula. Knee joint and ankle joint appear normal on two views. The lateral tibial plateau appears irregular however on the comparison femur exam same day the lateral tibial plateau appears normal. IMPRESSION: No fracture or dislocation. No aggressive osseous lesion identified. Electronically Signed   By: StSuzy Bouchard.D.   On: 06/04/2019 09:00   CT HEAD WO CONTRAST  Result Date: 06/17/2019 CLINICAL DATA:  Known  brain metastases. Worsening headache. Patient recently started anticoagulation. EXAM: CT HEAD WITHOUT CONTRAST CT CERVICAL SPINE WITHOUT CONTRAST TECHNIQUE: Multidetector CT imaging of the head and cervical spine was performed following the standard protocol without intravenous contrast. Multiplanar CT image reconstructions of the cervical spine were also generated. COMPARISON:  Jun 04, 2019 CT scan of the brain. Brain MRI May 10, 2019.  FINDINGS: CT HEAD FINDINGS Brain: No subdural, epidural, or subarachnoid hemorrhage. Most of the patient's known brain metastases are not visualized on this unenhanced CT scan. There is suggestion of a few metastases, very subtle. No acute cortical ischemia or infarct. Cerebellum, brainstem, and basal cisterns are unremarkable. No midline shift or mass effect identified. Vascular: No hyperdense vessel or unexpected calcification. Skull: Suspected calvarial metastases seen on MRI are not well appreciated on this study. Sinuses/Orbits: Minimal fluid in the posterior right mastoid air cells without bony erosion. There is opacification of the sphenoid sinuses which is worsened in the interval. 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 CT CERVICAL SPINE FINDINGS Alignment: Normal. Skull base and vertebrae: No fractures identified. Relative lucency in the left lamina and pedicle at C2, extending into the spinous process with periosteal reaction. No other discrete bony lesions in the cervical spine. A lucency in the right mandibular head is well-circumscribed. Soft tissues and spinal canal: No prevertebral fluid or swelling. No visible canal hematoma. Disc levels:  Mild multilevel degenerative disc disease. Upper chest: Only the superior most aspect of the right apical mass is seen on this CT. Several small nodules in the left apex are better assessed on today's CT scan of the chest. Other: No other abnormalities. IMPRESSION: 1. 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 There is suggestion of some of the metastases, very subtle. No midline shift. Basal cisterns are patent. 2. 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 3. Suspected calvarial metastases on the previous MRI are not well seen on this study. 4. No fracture or malalignment in the cervical  spine. Lucency in the left C2 lamina and pedicle extending into the spinous process with periosteal reaction suggest metastatic disease in this region. No other definitive bony metastatic disease in the cervical spine. 5. There is a rounded lucency in the right mandibular head which is nonspecific. 6. Nodularity in the lung apices. See the CT scan of the chest for better evaluation of this region. Electronically Signed   By: Dorise Bullion III M.D   On: 06/17/2019 16:37   CT Head Wo Contrast  Result Date: 06/04/2019 CLINICAL DATA:  Worsening headaches. Additional history provided: Recently diagnosed with metastatic lung cancer. EXAM: CT HEAD WITHOUT CONTRAST TECHNIQUE: Contiguous axial images were obtained from the base of the skull through the vertex without intravenous contrast. COMPARISON:  Brain MRI 05/10/2019, head CT 09/13/2012 FINDINGS: Brain: Numerous small enhancing parenchymal lesions were demonstrated on prior MRI 05/10/2019. Some of these are appreciated on today's examination but many of these are occult by CT. Several of these lesions were hemorrhagic on the prior MRI. There is a 3 mm focus of hyperdensity within the left occipital lobe consistent with redemonstrated hemorrhage associated with a lesion at this site (series 2, image 17). No definitively new lesion is identified. No demarcated cortical infarct. No extra-axial fluid collection. No evidence of intracranial mass. No midline shift. Vascular: No hyperdense vessel. Skull: No calvarial fracture.  Suspected metastases within the skull base and upper cervical spine were better appreciated on prior MRI. Sinuses/Orbits: No acute orbital abnormality by CT. Please note subtle enhancement along the posterior left globe with suspected on prior MRI. Ethmoid and sphenoid sinus mucosal thickening. Right sphenoid sinus air-fluid level. No significant mastoid effusion IMPRESSION: Numerous small parenchymal lesions (likely metastases) were demonstrated on  prior MRI 05/10/2019. Some of these lesions are appreciated on today's examination, but many of these are occult by CT. There is a 3 mm focus of hyperdensity consistent with hemorrhage at site of a known left occipital lobe lesion. However, this is unchanged and hemorrhage was present at this site on prior MRI. No definitively new lesion or interval acute intracranial abnormality is identified. Suspected metastases within the skull base and upper cervical spine were better appreciated on prior MRI. Paranasal sinus mucosal thickening with right sphenoid sinus air-fluid level. Correlate for acute sinusitis. Electronically Signed   By: Kellie Simmering DO   On: 06/04/2019 08:44   CT ANGIO CHEST PE W OR WO CONTRAST  Result Date: 06/17/2019 CLINICAL DATA:  Recently diagnosed metastatic lung cancer, worsening shortness of breath, chest pain, dyspnea EXAM: CT ANGIOGRAPHY CHEST WITH CONTRAST TECHNIQUE: Multidetector CT imaging of the chest was performed using the standard protocol during bolus administration of intravenous contrast. Multiplanar CT image reconstructions and MIPs were obtained to evaluate the vascular anatomy. CONTRAST:  166m OMNIPAQUE IOHEXOL 350 MG/ML SOLN COMPARISON:  06/13/2019 FINDINGS: Cardiovascular: This is a technically adequate evaluation of the pulmonary vasculature. The chronic right lower lobe pulmonary emboli seen previously are again noted and unchanged. There are no acute pulmonary emboli. The right hilar mass results in extrinsic compression of the right upper lobe pulmonary artery, unchanged since prior exam. No pericardial effusion.  The thoracic aorta remains unremarkable. Mediastinum/Nodes: Mediastinal and right hilar adenopathy unchanged since prior exam. Index lymph node in the right paratracheal region image 22 measures 11 mm in short axis, stable since previous study. Right hilar mass measures 2.5 cm reference image 37, previously measuring approximately 2.2 cm. No new adenopathy.  The thyroid, trachea, and esophagus are grossly normal. Lungs/Pleura: Thick-walled cavitary right upper lobe mass is again noted, measuring approximately 2.8 x 3.6 cm reference image 23, previously measuring 3.4 x 2.2 cm by my measurement. Findings are consistent with primary malignancy. Progressive wedge-shaped consolidation within the right upper lobe likely reflects pulmonary infarct given the extrinsic compression on the right upper lobe pulmonary artery by the infiltrative right hilar mass. Subcentimeter nodule left upper lobe image 28 and superior segment left lower lobe image 39 unchanged. There are trace bilateral pleural effusions right greater than left. No pneumothorax. There is narrowing of the right upper lobe bronchus and segmental airways due to the infiltrative right hilar mass described above, similar to previous study. Upper Abdomen: Numerous liver metastases are noted, please refer to CT abdomen report. Musculoskeletal: Mottled appearance of the thoracic spine consistent with bony metastases, stable since prior study. Pathologic T3 fracture is seen with mild retropulsion resulting in at least 50% narrowing of the central canal. There is likely an associated soft tissue component. No other pathologic fractures. Review of the MIP images confirms the above findings. IMPRESSION: 1. 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 No acute pulmonary embolus. 2. Cavitating right upper lobe mass consistent with lung cancer. Minimal increase in size. 3. Marked right hilar and mediastinal adenopathy as above, without significant change. There is extrinsic compression upon the right upper lobe pulmonary artery and  narrowing of the right upper lobe bronchus due to the right hilar mass, stable. 4. Progressive right upper lobe pulmonary infarct due to the extrinsic compression on the right upper lobe pulmonary artery. 5. Diffuse bony metastases. Pathologic T3 fracture with resulting central canal narrowing  as above. Please refer to CT thoracic spine report. 6. Trace bilateral pleural effusions. 7. Liver metastases.  Please refer to abdominal CT report. Electronically Signed   By: Randa Ngo M.D.   On: 06/17/2019 16:30   CT Angio Chest PE W and/or Wo Contrast  Result Date: 06/13/2019 CLINICAL DATA:  Shortness of breath. Chest pain. Metastatic lung carcinoma. Active chemotherapy and radiation. EXAM: CT ANGIOGRAPHY CHEST WITH CONTRAST TECHNIQUE: Multidetector CT imaging of the chest was performed using the standard protocol during bolus administration of intravenous contrast. Multiplanar CT image reconstructions and MIPs were obtained to evaluate the vascular anatomy. CONTRAST:  160m OMNIPAQUE IOHEXOL 350 MG/ML SOLN COMPARISON:  Chest radiograph earlier this day. Most recent chest CT 05/10/2019 FINDINGS: Cardiovascular: Right hilar adenopathy/soft tissue density causes severe narrowing of the right upper lobe pulmonary arteries which are attenuated. This causes mass effect on the distal main right pulmonary artery. Circumferential soft tissue density involving the right lower lobar pulmonary artery. Filling defects within the segmental branches of the right lower lobe pulmonary arteries are eccentric and may be due to direct invasion or de novo pulmonary embolus, for example series 6, image 145 and 149. No definite pulmonary emboli on the left, however breathing motion artifact and contrast bolus timing limits detailed assessment. Thoracic aorta is normal in caliber without dissection. Right chest port tip in the lower SVC. Heart is normal in size. No pericardial effusion. Mediastinum/Nodes: Progressive right hilar adenopathy/soft tissue density causes severe narrowing of the right upper lobe pulmonary arteries. This is increased from prior exam. Representative right hilar lymph node measures 22 mm, series 5, image 59, previously 19 mm. This is contiguous with right paratracheal soft tissue densities/adenopathy  which is difficult to delineate. Right lower paratracheal node has increased currently 13 mm, series 5, image 52, previously 10 mm. High mediastinal node measures 11 mm, series 5, image 37, unchanged. No left hilar adenopathy. Esophagus slightly patulous without wall thickening. No visualized thyroid nodule. Lungs/Pleura: Spiculated right upper lobe cavitary nodule currently measuring approximately 3 x 2.6 cm with decreased peripheral soft tissue component from prior exam. This is contiguous with soft tissue density that extends to the right hilum. Severe narrowing of the right upper lobe bronchus. Ill-defined paramediastinal density in the right upper lobe is likely post treatment related change. Peripheral subpleural opacity in the right upper lobe abutting, series 7, image 57 is nonspecific, and may represent pulmonary infarct, post treatment related change, or pneumonia. Small left apical nodule series 7, image 39 and 40 are unchanged. Small nodule in the superior segment of the left lower lobe is unchanged, series 7, image 55. 3 mm perifissural nodule in the right middle lobe, series 7, image 64, unchanged. No definite new nodule. No pleural fluid. Upper Abdomen: Known metastatic disease in the liver, not as well visualized on the current exam given phase of contrast. No acute abdominal findings. 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 T3 lesion causing mild pathologic compression fracture, similar to prior. No new spinal compression fractures. Review of the MIP images confirms the above findings. IMPRESSION: 1. Progressive right hilar adenopathy/soft tissue density which causes severe narrowing of the right upper lobe pulmonary arteries which are attenuated. Small segmental pulmonary emboli  in the right lower lobe are adjacent to adenopathy, may represent direct invasion versus did no novo clot. Thromboembolic burden is small. No pulmonary embolus on the left, however breathing motion  artifact and contrast bolus timing limits detailed assessment. 2. Spiculated right upper lobe cavitary nodule with decreased peripheral soft tissue component from prior exam and slight decrease in size. 3. Progressive right hilar adenopathy/soft tissue density causes severe narrowing of the right upper lobe bronchus. There may be an element of post treatment related change, however some of the mediastinal nodes have increased in size suspicious for progression in disease. 4. Additional small pulmonary nodules both lungs are unchanged from exam last month. 5. Osseous metastatic disease is grossly unchanged. Known hepatic metastatic disease is not well delineated given phase of contrast These results were called by telephone at the time of interpretation on 06/13/2019 at 1:52 pm to [place] , who verbally acknowledged these results. Electronically Signed   By: Keith Rake M.D.   On: 06/13/2019 13:53   CT CERVICAL SPINE WO CONTRAST  Result Date: 06/17/2019 CLINICAL DATA:  Known brain metastases. Worsening headache. Patient recently started anticoagulation. EXAM: CT HEAD WITHOUT CONTRAST CT CERVICAL SPINE WITHOUT CONTRAST TECHNIQUE: Multidetector CT imaging of the head and cervical spine was performed following the standard protocol without intravenous contrast. Multiplanar CT image reconstructions of the cervical spine were also generated. COMPARISON:  Jun 04, 2019 CT scan of the brain. Brain MRI May 10, 2019. FINDINGS: CT HEAD FINDINGS Brain: No subdural, epidural, or subarachnoid hemorrhage. Most of the patient's known brain metastases are not visualized on this unenhanced CT scan. There is suggestion of a few metastases, very subtle. No acute cortical ischemia or infarct. Cerebellum, brainstem, and basal cisterns are unremarkable. No midline shift or mass effect identified. Vascular: No hyperdense vessel or unexpected calcification. Skull: Suspected calvarial metastases seen on MRI are not well  appreciated on this study. Sinuses/Orbits: Minimal fluid in the posterior right mastoid air cells without bony erosion. There is opacification of the sphenoid sinuses which is worsened in the interval. 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 CT CERVICAL SPINE FINDINGS Alignment: Normal. Skull base and vertebrae: No fractures identified. Relative lucency in the left lamina and pedicle at C2, extending into the spinous process with periosteal reaction. No other discrete bony lesions in the cervical spine. A lucency in the right mandibular head is well-circumscribed. Soft tissues and spinal canal: No prevertebral fluid or swelling. No visible canal hematoma. Disc levels:  Mild multilevel degenerative disc disease. Upper chest: Only the superior most aspect of the right apical mass is seen on this CT. Several small nodules in the left apex are better assessed on today's CT scan of the chest. Other: No other abnormalities. IMPRESSION: 1. 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 There is suggestion of some of the metastases, very subtle. No midline shift. Basal cisterns are patent. 2. 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 3. Suspected calvarial metastases on the previous MRI are not well seen on this study. 4. No fracture or malalignment in the cervical spine. Lucency in the left C2 lamina and pedicle extending into the spinous process with periosteal reaction suggest metastatic disease in this region. No other definitive bony metastatic disease in the cervical spine. 5. There is a rounded lucency in the right mandibular head which  is nonspecific. 6. Nodularity in the lung apices. See the CT scan of the chest for better evaluation of this region. Electronically Signed   By: Dorise Bullion III M.D   On: 06/17/2019 16:37   MR Cervical Spine  Wo Contrast  Result Date: 06/17/2019 CLINICAL DATA:  34 year old male with widely metastatic lung cancer. Back pain. EXAM: MRI CERVICAL SPINE WITHOUT CONTRAST TECHNIQUE: Multiplanar, multisequence MR imaging of the cervical spine was performed. No intravenous contrast was administered. COMPARISON:  CT Thoracic Spine, Lumbar Spine, CT Chest, Abdomen, and Pelvis yesterday. Brain MRI 05/10/2019. FINDINGS: Alignment: Overall straightening of cervical lordosis. Vertebrae: Metastatic disease to bone at virtually all cervical spine levels, and throughout the visible skull base. Only the C1 vertebra seems to be spared at this time. Anterior and posterior element metastases in the cervical spine. No pathologic cervical vertebral fracture, although there is epidural and extraosseous extension of tumor from the left C2 posterior elements (series 13, image 4) with severe involvement of the left C3 neural foramen. No significant cervical spinal stenosis. See thoracic spine findings reported separately. Cord: Spinal cord signal is within normal limits at all visualized levels. No IV contrast administered. Posterior Fossa, vertebral arteries, paraspinal tissues: Cervicomedullary junction is within normal limits. Abnormal T2 and STIR hyperintensity in the visible right cerebellar hemisphere compatible with known brain metastases. No posterior fossa mass effect is evident. Preserved major vascular flow voids in the neck. There is inflammation of the C2-C3 level posterior paraspinal muscles greater on the left which is probably related to the C2 extraosseous tumor. Disc levels: Incidental degenerative changes including: C3-C4: Small leftward disc herniation with mild spinal stenosis and mild spinal cord mass effect. C4-C5: Disc bulge and small rightward disc herniation with mild spinal stenosis and mild right hemi cord mass effect. Associated moderate to severe right C5 foraminal stenosis. IMPRESSION: 1. Widespread osseous  metastatic disease throughout the cervical spine and the visible skull base. 2. Epidural and extraosseous extension of tumor from the left C2 posterior elements with severe involvement of the left C3 neural foramen, but no malignant spinal stenosis. There is mild degenerative cervical spinal stenosis at C3-C4 and C4-C5. 3. No pathologic cervical vertebral fracture. 4. See Thoracic Spine findings reported separately. Electronically Signed   By: Genevie Ann M.D.   On: 06/17/2019 21:59   MR THORACIC SPINE WO CONTRAST  Result Date: 06/17/2019 CLINICAL DATA:  34 year old male with widely metastatic lung cancer. Back pain. EXAM: MRI THORACIC SPINE WITHOUT CONTRAST TECHNIQUE: Multiplanar, multisequence MR imaging of the thoracic spine was performed. No intravenous contrast was administered. COMPARISON:  Cervical MRI today reported separately. Thoracic spine CT, CT Chest, Abdomen, and Pelvis yesterday. FINDINGS: Limited cervical spine imaging:  Reported separately today. Thoracic spine segmentation:  Normal. Alignment:  Straightening of thoracic kyphosis. Vertebrae: Thoracic vertebral metastases at every level. Subtotal T1 vertebral replacement by tumor. Early extraosseous extension from the spinous process into the paraspinal muscles. T3 vertebral body pathologic compression fracture with 50% central loss of vertebral body height, and some retropulsed bone/tumor. Additionally, there is evidence of some epidural tumor extension at that level (series 34, image 15) resulting in mild spinal stenosis, borderline to mild cord compression. Moderate left T3 neural foraminal stenosis. Epidural tumor also at the T5 level where the vertebra is completely replaced by tumor. Abundant extraosseous extension of tumor also from the posterior elements into the posterior paraspinal musculature. Mild spinal stenosis and borderline to mild cord compression. T6 near complete vertebral replacement by tumor with ventral  and foraminal epidural  tumor extension. When superimposed on epidural lipomatosis there is mild spinal stenosis and cord compression (series 34, image 28). Moderate to severe right T6 foraminal involvement. Ventral epidural tumor extension at T7 without cord compression at this time (series 34, image 32). Ventral epidural tumor extension at T9 with mild spinal stenosis and borderline to mild cord compression at this time (image 42). T10 right side posterior element extraosseous extension of tumor into the paraspinal muscles. Cord: No thoracic cord signal abnormality despite the multiple levels of epidural tumor. Paraspinal and other soft tissues: Abnormal right upper lung and mediastinum. Small layering right pleural effusion. Partially visible liver metastases. Disc levels: There are some superimposed thoracic degenerative changes, including: -Mild degenerative thoracic spinal stenosis at T8-T9 related to left eccentric disc and endplate degeneration, mild to moderate posterior element degeneration. -borderline to mild degenerative spinal stenosis at T11-T12 related to right paracentral disc protrusion and mild facet hypertrophy. IMPRESSION: 1. Bone metastases involving every thoracic spine level. Pathologic fracture of T3 with up to 50% central vertebral loss of height, mild retropulsion of bone in tumor. 2. Ventral epidural tumor with up to mild spinal stenosis and borderline to mild cord compression at: T3, T5, T6, T7, T9. 3. Moderate or severe neural foraminal involvement by tumor at the left T3, right T6 nerve levels. 4. Superimposed mild degenerative thoracic spinal stenosis at T8-T9 and T11-T12. Electronically Signed   By: Genevie Ann M.D.   On: 06/17/2019 22:09   MR LUMBAR SPINE WO CONTRAST  Result Date: 06/17/2019 CLINICAL DATA:  34 year old male with widely metastatic lung cancer. Back pain. EXAM: MRI LUMBAR SPINE WITHOUT CONTRAST TECHNIQUE: Multiplanar, multisequence MR imaging of the lumbar spine was performed. No intravenous  contrast was administered. COMPARISON:  Thoracic spine MRI today. Lumbar CT, CT Chest, Abdomen, and Pelvis yesterday. Lumbar MRI 05/10/2019. FINDINGS: Segmentation: Normal, concordant with the thoracic spine numbering today. Alignment:  Stable lumbar lordosis since April. Vertebrae: Lumbar metastatic disease at all levels. Compared to 05/10/2019 lumbar vertebral tumor shows mild generalized progression. Confluent sacral and medial iliac bone metastatic disease redemonstrated. Mild pathologic fracture of the L2 superior endplate. This has only mildly progressed since April (series 38, image 9). No other lumbar pathologic fracture. And although there is some early lumbar extraosseous tumor extension (right L4 transverse process series 39, image 25), there is no significant lumbar epidural or neural foraminal tumor at this time. However, there is obliteration of the right S1, left S2 and S3 neural foramina by tumor. Conus medullaris and cauda equina: Conus extends to the T12-L1 level. No lower spinal cord or conus signal abnormality. Cauda equina nerve roots appear normal in the absence of contrast. Paraspinal and other soft tissues: Stable visible abdominal viscera. Disc levels: Lumbar epidural lipomatosis. Superimposed lumbar degenerative changes most notable for: -L3-L4 left paracentral disc protrusion superimposed on disc bulge, posterior element hypertrophy and epidural lipomatosis. Moderate left lateral recess stenosis (left L4 nerve level) and mild spinal stenosis. IMPRESSION: 1. Lumbar metastatic disease at all levels with mild generalized progression since the April MRI. Mild pathologic fracture of the L2 superior endplate. 2. Occasional early extraosseous extension of tumor (right L4 transverse process), but no lumbar epidural or neural foraminal tumor at this time. 3. Bulky sacral tumor re-demonstrated including severe involvement of the right S1, left S2, and left S3 neural foramina. Electronically Signed    By: Genevie Ann M.D.   On: 06/17/2019 22:16   US Abdomen Complete  Result Date: 06/21/2019 CLINICAL  DATA:  Postprandial abdominal pain. Metastatic lung cancer. EXAM: ABDOMEN ULTRASOUND COMPLETE COMPARISON:  CT abdomen pelvis 05/10/2019 FINDINGS: Gallbladder: No gallstones or wall thickening visualized. No sonographic Murphy sign noted by sonographer. Common bile duct: Diameter: 2.7 mm Liver: Liver parenchyma demonstrates increased echogenicity diffusely. Multiple liver masses are present compatible metastatic disease as noted on prior CT. Largest lesions in the right lobe measure 3.7 x 2.6 x 1.7 cm, and 3.8 x 3.5 x 3.5 cm. Multiple smaller lesions also present in the liver. Portal vein is patent on color Doppler imaging with normal direction of blood flow towards the liver. IVC: No abnormality visualized. Pancreas: Visualized portion unremarkable. Spleen: Size and appearance within normal limits. Right Kidney: Length: 9.7 cm. Echogenicity within normal limits. No mass or hydronephrosis visualized. Left Kidney: Length: 10.4 cm. Echogenicity within normal limits. No mass or hydronephrosis visualized. Abdominal aorta: No aneurysm visualized. Other findings: Small right pleural effusion IMPRESSION: Negative for gallstones Multiple hepatic metastasis as noted on CT. Fatty infiltration liver is noted. Small right pleural effusion. Electronically Signed   By: Franchot Gallo M.D.   On: 06/21/2019 09:08   CT ABDOMEN PELVIS W CONTRAST  Result Date: 06/17/2019 CLINICAL DATA:  Metastatic lung cancer, chest pain, lower and mid back pain EXAM: CT ABDOMEN AND PELVIS WITH CONTRAST TECHNIQUE: Multidetector CT imaging of the abdomen and pelvis was performed using the standard protocol following bolus administration of intravenous contrast. CONTRAST:  151m OMNIPAQUE IOHEXOL 350 MG/ML SOLN COMPARISON:  05/10/2019 FINDINGS: Lower chest: Please refer to corresponding chest CT report describing right upper lobe malignancy, right  hilar and mediastinal adenopathy, and chronic right lower lobe pulmonary emboli. Hepatobiliary: Innumerable hepatic metastases are again noted, increased in size. Largest index lesion within the caudate measures 4.9 cm image 18, previously measuring 2.8 cm image 47. No biliary dilation.  Gallbladder is unremarkable. Pancreas: Unremarkable. No pancreatic ductal dilatation or surrounding inflammatory changes. Spleen: Normal in size without focal abnormality. Adrenals/Urinary Tract: Adrenal glands are unremarkable. Kidneys are normal, without renal calculi, focal lesion, or hydronephrosis. Bladder is unremarkable. Stomach/Bowel: No bowel obstruction or ileus. No bowel wall thickening or inflammatory changes. Vascular/Lymphatic: No pathologic adenopathy within the abdomen or pelvis. Vascular structures are unremarkable. Reproductive: Prostate is unremarkable. Other: No free fluid or free gas.  No abdominal wall hernia. Musculoskeletal: Progressive diffuse bony metastases are seen throughout the bilateral hips, lumbar spine, sacrum, and bony pelvis. Please refer to dedicated CT lumbar spine describing findings related to the lumbar spine and sacrum. Increased size of the expansile left iliac crest lesion with associated soft tissue component. There are no pathologic fractures. IMPRESSION: 1. Progressive liver metastases as above. 2. Progressive bony metastases involving the lumbar spine, bony pelvis, and sacrum. Please refer to corresponding CT lumbar spine report for important findings in that region. Electronically Signed   By: MRanda NgoM.D.   On: 06/17/2019 16:46   CT T-SPINE NO CHARGE  Result Date: 06/17/2019 CLINICAL DATA:  Metastatic lung cancer, back pain EXAM: CT THORACIC SPINE WITHOUT CONTRAST TECHNIQUE: Multidetector CT images of the thoracic were obtained using the standard protocol without intravenous contrast. COMPARISON:  06/13/2019 FINDINGS: Alignment: Alignment is anatomic. Vertebrae: Diffuse  metastatic disease is seen throughout the entirety of the thoracic spine. There is moderate appearance and destruction of the T3 vertebral body with pathologic fracture centrally with 50% loss of vertebral body height. Paraspinal and other soft tissues: Please refer to corresponding chest CT report describing a cavitating right upper lobe mass, right hilar mass,  and diffuse mediastinal adenopathy. Trace bilateral pleural effusions are noted. There are chronic right lower lobe segmental pulmonary emboli. Disc levels: As result of the T3 pathologic fracture and metastatic disease described above, there is approximately 50% narrowing of the central canal at T3. At T8 and T9, there are expansile metastatic lesions resulting in bowing of the posterior margin of the vertebral bodies with estimated 50-75% narrowing of the central canal and likely cord compression. IMPRESSION: 1. Diffuse bony metastases throughout the entirety of the thoracic spine, most pronounced at T3, T8, and T9 with resulting central canal narrowing and likely cord compression. 2. Pathologic fracture of the T3 vertebral body, with 50% loss of height centrally. 3. Please refer to CT chest report describing cavitating right upper lobe mass, right hilar mass, and mediastinal adenopathy. Electronically Signed   By: Randa Ngo M.D.   On: 06/17/2019 16:40   CT L-SPINE NO CHARGE  Result Date: 06/17/2019 CLINICAL DATA:  Low back pain, metastatic lung cancer EXAM: CT LUMBAR SPINE WITHOUT CONTRAST TECHNIQUE: Multidetector CT imaging of the lumbar spine was performed without intravenous contrast administration. Multiplanar CT image reconstructions were also generated. COMPARISON:  06/13/2019 FINDINGS: Segmentation: There are 5 non-rib-bearing lumbar type vertebral bodies. Alignment: Alignment is anatomic. Vertebrae: There is diffuse metastatic disease, with multiple lytic lesions throughout the lumbar spine. These are most pronounced within the left  aspect of the L1 vertebral body extending into the left pedicle, anterior aspect of the L2 vertebral body, right L3 transverse process and central aspect L3 vertebral body, right L4 pedicle and transverse process. Additional lytic lesions are seen within the bilateral iliac bones and sacrum. Bony destruction and soft tissue mass obliterate the left S2/S3 and S3/S4 neural foramen. I do not see any pathologic fracture within the lumbar spine. Paraspinal and other soft tissues: Soft tissue mass results in obliteration of the left S2/S3 and S3/S4 neural foramen. Likely soft tissue component arising from the L1 metastatic lesion with slight enlargement of the left psoas muscle. Disc levels: There is multilevel lumbar spondylosis. Mild central canal stenosis is seen at L1/L2, L2/L3, and L3/L4. At L3/L4 there is left predominant lateral recess and neural foraminal encroachment as well. IMPRESSION: 1. Diffuse bony metastatic disease throughout the lumbar spine, sacrum, and bilateral iliac bones as above. 2. There are no pathologic fractures. Soft tissue mass and bony destruction obliterate the left S2/S3 and S3/S4 neural foramen. 3. Multilevel lumbar spondylosis most pronounced at L3/L4 with left predominant neural foraminal and lateral recess encroachment. Electronically Signed   By: Randa Ngo M.D.   On: 06/17/2019 16:35   DG Chest Portable 1 View  Result Date: 06/13/2019 CLINICAL DATA:  Shortness of breath and chest pain EXAM: PORTABLE CHEST 1 VIEW COMPARISON:  Jun 02, 2019 FINDINGS: Port-A-Cath tip is in the superior vena cava. No pneumothorax. Lungs are clear. Heart size and pulmonary vascularity are normal. No adenopathy. No bone lesions. IMPRESSION: Port-A-Cath tip in superior vena cava. No pneumothorax. Lungs clear. Cardiac silhouette within normal limits. Electronically Signed   By: Lowella Grip III M.D.   On: 06/13/2019 10:54   DG Chest Portable 1 View  Result Date: 06/02/2019 CLINICAL DATA:  Hervey Ard  right-sided chest pain EXAM: PORTABLE CHEST 1 VIEW COMPARISON:  May 06, 2019 FINDINGS: The previously demonstrated right upper lobe pulmonary nodule is better visualized on the patient's prior CT chest and prior x-rays. There is no pneumothorax. No significant pleural effusion. No evidence for an acute osseous abnormality. The heart size is  normal. There is some mild thickening of the right paratracheal stripe which may be secondary to underlying mediastinal adenopathy. IMPRESSION: 1. Mild thickening of the right paratracheal stripe which may be secondary to underlying mediastinal adenopathy. 2. The previously demonstrated right upper lobe pulmonary nodule is better visualized on the patient's prior CT chest and prior x-rays. 3. No acute cardiopulmonary process. Electronically Signed   By: Constance Holster M.D.   On: 06/02/2019 17:55   DG Femur Min 2 Views Left  Result Date: 06/04/2019 CLINICAL DATA:  Lower extremity pain. EXAM: LEFT FEMUR 2 VIEWS COMPARISON:  None. FINDINGS: Frontal and lateral views were obtained. No fracture or dislocation. No abnormal periosteal reaction. No blastic or lytic bone lesions. There is moderate degenerative change in the knee joint. No appreciable knee joint effusion. IMPRESSION: No blastic or lytic bone lesions. No abnormal periosteal reaction. No fracture or dislocation. 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 Electronically Signed   By: Lowella Grip III M.D.   On: 06/04/2019 08:55   IR IMAGING GUIDED PORT INSERTION  Result Date: 06/12/2019 INDICATION: 34 year old male with a history of metastatic right upper lobe lung cancer. He presents for port catheter placement. EXAM: IMPLANTED PORT A CATH PLACEMENT WITH ULTRASOUND AND FLUOROSCOPIC GUIDANCE MEDICATIONS: Ancef 2 g; The antibiotic was administered within an appropriate time interval prior to skin puncture. ANESTHESIA/SEDATION: Versed 4 mg IV; Fentanyl 100 mcg IV; Moderate Sedation Time:  19 minutes The  patient was continuously monitored during the procedure by the interventional radiology nurse under my direct supervision. FLUOROSCOPY TIME:  0 minutes, 30 seconds (7 mGy) COMPLICATIONS: None immediate. PROCEDURE: The right neck and chest was prepped with chlorhexidine, and draped in the usual sterile fashion using maximum barrier technique (cap and mask, sterile gown, sterile gloves, large sterile sheet, hand hygiene and cutaneous antiseptic). Local anesthesia was attained by infiltration with 1% lidocaine with epinephrine. Ultrasound demonstrated patency of the right internal jugular vein, and this was documented with an image. Under real-time ultrasound guidance, this vein was accessed with a 21 gauge micropuncture needle and image documentation was performed. A small dermatotomy was made at the access site with an 11 scalpel. A 0.018" wire was advanced into the SVC and the access needle exchanged for a 46F micropuncture vascular sheath. The 0.018" wire was then removed and a 0.035" wire advanced into the IVC. An appropriate location for the subcutaneous reservoir was selected below the clavicle and an incision was made through the skin and underlying soft tissues. The subcutaneous tissues were then dissected using a combination of blunt and sharp surgical technique and a pocket was formed. A single lumen power injectable portacatheter was then tunneled through the subcutaneous tissues from the pocket to the dermatotomy and the port reservoir placed within the subcutaneous pocket. The venous access site was then serially dilated and a peel away vascular sheath placed over the wire. The wire was removed and the port catheter advanced into position under fluoroscopic guidance. The catheter tip is positioned in the superior cavoatrial junction. This was documented with a spot image. The portacatheter was then tested and found to flush and aspirate well. The port was flushed with saline followed by 100 units/mL  heparinized saline. The pocket was then closed in two layers using first subdermal inverted interrupted absorbable sutures followed by a running subcuticular suture. The epidermis was then sealed with Dermabond. The dermatotomy at the venous access site was also closed with Dermabond. IMPRESSION: Successful placement of a right IJ approach Power  Port with ultrasound and fluoroscopic guidance. The catheter is ready for use. Electronically Signed   By: Jacqulynn Cadet M.D.   On: 06/12/2019 16:26   VAS [place] LOWER EXTREMITY VENOUS (DVT) (ONLY [facility] & [facility])  Result Date: 06/04/2019  Lower Venous DVTStudy Indications: Swelling.  Risk Factors: None identified. Limitations: Poor ultrasound/tissue interface. Comparison Study: No prior studies. Performing Technologist: Oliver Hum RVT  Examination Guidelines: A complete evaluation includes B-mode imaging, spectral Doppler, color Doppler, and power Doppler as needed of all accessible portions of each vessel. Bilateral testing is considered an integral part of a complete examination. Limited examinations for reoccurring indications may be performed as noted. The reflux portion of the exam is performed with the patient in reverse Trendelenburg.  +-----+---------------+---------+-----------+----------+--------------+ RIGHTCompressibilityPhasicitySpontaneityPropertiesThrombus Aging +-----+---------------+---------+-----------+----------+--------------+ CFV  Full           Yes      Yes                                 +-----+---------------+---------+-----------+----------+--------------+   +---------+---------------+---------+-----------+----------+--------------+ LEFT     CompressibilityPhasicitySpontaneityPropertiesThrombus Aging +---------+---------------+---------+-----------+----------+--------------+ CFV      Full           Yes      Yes                                 +---------+---------------+---------+-----------+----------+--------------+  SFJ      Full                                                        +---------+---------------+---------+-----------+----------+--------------+ FV Prox  Full                                                        +---------+---------------+---------+-----------+----------+--------------+ FV Mid   Full                                                        +---------+---------------+---------+-----------+----------+--------------+ FV DistalFull                                                        +---------+---------------+---------+-----------+----------+--------------+ PFV      Full                                                        +---------+---------------+---------+-----------+----------+--------------+ POP      None           No       No  Acute          +---------+---------------+---------+-----------+----------+--------------+ PTV      None                                         Acute          +---------+---------------+---------+-----------+----------+--------------+ PERO     None                                         Acute          +---------+---------------+---------+-----------+----------+--------------+ Gastroc  None                                         Acute          +---------+---------------+---------+-----------+----------+--------------+     Summary: RIGHT: - No evidence of common femoral vein obstruction.  LEFT: - Findings consistent with acute deep vein thrombosis involving the left popliteal vein, left posterior tibial veins, left peroneal veins, and left gastrocnemius veins. - No cystic structure found in the popliteal fossa.  *See table(s) above for measurements and observations. Electronically signed by Deitra Mayo MD on 06/04/2019 at 12:00:49 PM.    Final       Discharge Exam: Vitals:   06/27/19 2238 06/28/19 0616  BP: (!) 157/94 (!) 154/93  Pulse: 90 83  Resp: 16 16  Temp: (!) 97.5  F (36.4 C) 98.2 F (36.8 C)  SpO2: 99% 98%   Vitals:   06/27/19 0610 06/27/19 1329 06/27/19 2238 06/28/19 0616  BP: (!) 150/91 (!) 145/95 (!) 157/94 (!) 154/93  Pulse: 74 84 90 83  Resp: 19 17 16 16   Temp: 97.8 F (36.6 C) 98.5 F (36.9 C) (!) 97.5 F (36.4 C) 98.2 F (36.8 C)  TempSrc: Oral Oral Oral Oral  SpO2: 97% 100% 99% 98%  Weight:      Height:        General: Pt is alert, awake, not in acute distress Cardiovascular: RRR, S1/S2 +, no rubs, no gallops, port present at the chest Respiratory: CTA bilaterally, no wheezing, no rhonchi Abdominal: Soft, NT, ND, bowel sounds + Extremities: no edema, no cyanosis    The results of significant diagnostics from this hospitalization (including imaging, microbiology, ancillary and laboratory) are listed below for reference.     Microbiology: No results found for this or any previous visit (from the past 240 hour(s)).   Labs: BNP (last 3 results) No results for input(s): BNP in the last 8760 hours. Basic Metabolic Panel: Recent Labs  Lab 06/24/19 0550 06/25/19 0546 06/26/19 0334 06/27/19 0500 06/28/19 0444  NA 137 141 139 140 137  K 4.2 4.4 4.6 4.5 4.7  CL 104 108 104 106 102  CO2 25 25 24 25 26   GLUCOSE 168* 140* 178* 135* 154*  BUN 27* 28* 25* 25* 25*  CREATININE 1.18 1.15 1.14 1.12 1.16  CALCIUM 8.6* 8.5* 8.5* 8.5* 8.3*   Liver Function Tests: Recent Labs  Lab 06/24/19 0550 06/25/19 0546 06/26/19 0334 06/27/19 0500 06/28/19 0444  AST 48* 62* 51* 55* 61*  ALT 214* 202* 179* 166* 166*  ALKPHOS 373* 362* 358* 342* 345*  BILITOT 0.5 0.4 0.6 0.9 0.7  PROT 6.4*  6.1* 5.8* 5.9* 5.8*  ALBUMIN 3.1* 2.9* 2.7* 2.9* 2.9*   No results for input(s): LIPASE, AMYLASE in the last 168 hours. No results for input(s): AMMONIA in the last 168 hours. CBC: Recent Labs  Lab 06/24/19 0550 06/25/19 0546 06/26/19 0334 06/27/19 0500 06/28/19 0444  WBC 10.8* 8.6 8.3 7.0 5.3  NEUTROABS 9.6* 7.9* 8.0* 6.8 5.1  HGB 10.7*  10.2* 10.3* 9.9* 9.4*  HCT 33.9* 33.4* 32.0* 30.6* 29.8*  MCV 87.1 87.9 87.0 86.4 86.6  PLT 153 144* 121* 130* 118*   Cardiac Enzymes: No results for input(s): CKTOTAL, CKMB, CKMBINDEX, TROPONINI in the last 168 hours. BNP: Invalid input(s): POCBNP CBG: No results for input(s): GLUCAP in the last 168 hours. D-Dimer No results for input(s): DDIMER in the last 72 hours. Hgb A1c No results for input(s): HGBA1C in the last 72 hours. Lipid Profile No results for input(s): CHOL, HDL, LDLCALC, TRIG, CHOLHDL, LDLDIRECT in the last 72 hours. Thyroid function studies No results for input(s): TSH, T4TOTAL, T3FREE, THYROIDAB in the last 72 hours.  Invalid input(s): FREET3 Anemia work up No results for input(s): VITAMINB12, FOLATE, FERRITIN, TIBC, IRON, RETICCTPCT in the last 72 hours. Urinalysis    Component Value Date/Time   COLORURINE YELLOW 06/02/2019 1838   APPEARANCEUR CLEAR 06/02/2019 1838   LABSPEC 1.016 06/02/2019 1838   PHURINE 5.0 06/02/2019 1838   GLUCOSEU NEGATIVE 06/02/2019 1838   HGBUR LARGE (A) 06/02/2019 1838   BILIRUBINUR NEGATIVE 06/02/2019 1838   KETONESUR NEGATIVE 06/02/2019 1838   PROTEINUR NEGATIVE 06/02/2019 1838   UROBILINOGEN 1.0 05/02/2014 1253   NITRITE NEGATIVE 06/02/2019 1838   LEUKOCYTESUR NEGATIVE 06/02/2019 1838   Sepsis Labs Invalid input(s): PROCALCITONIN,  WBC,  LACTICIDVEN Microbiology No results found for this or any previous visit (from the past 240 hour(s)).   Time coordinating discharge: Over 30 minutes  SIGNED:   Darliss Cheney, MD  [facility] 06/28/2019, 10:43 AM  If 7PM-7AM, please contact night-coverage [email or web address]

## 2019-06-28 NOTE — Progress Notes (Signed)
Pt discharged home with spouse in stable condition. Discharge instructions given. Scripts sent to pharmacy of choice. No immediate questions or concerns at this time. Pt discharged from unit via wheelchair.

## 2019-06-29 ENCOUNTER — Other Ambulatory Visit: Payer: Self-pay | Admitting: Hematology and Oncology

## 2019-06-29 ENCOUNTER — Other Ambulatory Visit: Payer: Self-pay | Admitting: *Deleted

## 2019-06-29 ENCOUNTER — Encounter: Payer: Self-pay | Admitting: Hematology and Oncology

## 2019-06-29 MED ORDER — FOLIC ACID 1 MG PO TABS: 1.0000 mg | ORAL_TABLET | Freq: Every day | ORAL | 3 refills | Status: AC

## 2019-06-29 MED ORDER — RIVAROXABAN 20 MG PO TABS
20.0000 mg | ORAL_TABLET | Freq: Every day | ORAL | 1 refills | Status: DC
Start: 2019-06-29 — End: 2019-08-09

## 2019-06-29 MED FILL — XARELTO 20 MG TABLET: 20 | 30 days supply | Qty: 30 | Fill #0

## 2019-07-04 ENCOUNTER — Other Ambulatory Visit: Payer: Self-pay | Admitting: Hematology and Oncology

## 2019-07-04 DIAGNOSIS — C3491 Malignant neoplasm of unspecified part of right bronchus or lung: Secondary | ICD-10-CM

## 2019-07-04 DIAGNOSIS — C7931 Secondary malignant neoplasm of brain: Secondary | ICD-10-CM

## 2019-07-04 DIAGNOSIS — C3490 Malignant neoplasm of unspecified part of unspecified bronchus or lung: Secondary | ICD-10-CM

## 2019-07-05 ENCOUNTER — Inpatient Hospital Stay: Payer: PRIVATE HEALTH INSURANCE

## 2019-07-05 ENCOUNTER — Encounter: Payer: Self-pay | Admitting: Hematology and Oncology

## 2019-07-05 ENCOUNTER — Other Ambulatory Visit: Payer: Self-pay

## 2019-07-05 ENCOUNTER — Inpatient Hospital Stay: Payer: PRIVATE HEALTH INSURANCE | Attending: Hematology and Oncology | Admitting: Hematology and Oncology

## 2019-07-05 VITALS — BP 134/85 | HR 132 | Temp 97.5°F | Resp 17 | Ht 67.0 in | Wt 200.2 lb

## 2019-07-05 DIAGNOSIS — C787 Secondary malignant neoplasm of liver and intrahepatic bile duct: Secondary | ICD-10-CM | POA: Diagnosis not present

## 2019-07-05 DIAGNOSIS — C3491 Malignant neoplasm of unspecified part of right bronchus or lung: Secondary | ICD-10-CM | POA: Insufficient documentation

## 2019-07-05 DIAGNOSIS — C7951 Secondary malignant neoplasm of bone: Secondary | ICD-10-CM | POA: Diagnosis not present

## 2019-07-05 DIAGNOSIS — Z79899 Other long term (current) drug therapy: Secondary | ICD-10-CM | POA: Insufficient documentation

## 2019-07-05 DIAGNOSIS — C7931 Secondary malignant neoplasm of brain: Secondary | ICD-10-CM | POA: Insufficient documentation

## 2019-07-05 DIAGNOSIS — C7949 Secondary malignant neoplasm of other parts of nervous system: Secondary | ICD-10-CM

## 2019-07-05 DIAGNOSIS — R634 Abnormal weight loss: Secondary | ICD-10-CM | POA: Diagnosis not present

## 2019-07-05 DIAGNOSIS — Z5111 Encounter for antineoplastic chemotherapy: Secondary | ICD-10-CM | POA: Diagnosis present

## 2019-07-05 DIAGNOSIS — G893 Neoplasm related pain (acute) (chronic): Secondary | ICD-10-CM | POA: Insufficient documentation

## 2019-07-05 DIAGNOSIS — I2694 Multiple subsegmental pulmonary emboli without acute cor pulmonale: Secondary | ICD-10-CM

## 2019-07-05 DIAGNOSIS — Z95828 Presence of other vascular implants and grafts: Secondary | ICD-10-CM

## 2019-07-05 LAB — CBC WITH DIFFERENTIAL (CANCER CENTER ONLY)
Abs Immature Granulocytes: 0.07 10*3/uL (ref 0.00–0.07)
Basophils Absolute: 0 10*3/uL (ref 0.0–0.1)
Basophils Relative: 0 %
Eosinophils Absolute: 0 10*3/uL (ref 0.0–0.5)
Eosinophils Relative: 1 %
HCT: 33.6 % — ABNORMAL LOW (ref 39.0–52.0)
Hemoglobin: 10.6 g/dL — ABNORMAL LOW (ref 13.0–17.0)
Immature Granulocytes: 1 %
Lymphocytes Relative: 3 %
Lymphs Abs: 0.2 10*3/uL — ABNORMAL LOW (ref 0.7–4.0)
MCH: 28 pg (ref 26.0–34.0)
MCHC: 31.5 g/dL (ref 30.0–36.0)
MCV: 88.7 fL (ref 80.0–100.0)
Monocytes Absolute: 0.5 10*3/uL (ref 0.1–1.0)
Monocytes Relative: 9 %
Neutro Abs: 5.3 10*3/uL (ref 1.7–7.7)
Neutrophils Relative %: 86 %
Platelet Count: 73 10*3/uL — ABNORMAL LOW (ref 150–400)
RBC: 3.79 MIL/uL — ABNORMAL LOW (ref 4.22–5.81)
RDW: 18.4 % — ABNORMAL HIGH (ref 11.5–15.5)
WBC Count: 6.1 10*3/uL (ref 4.0–10.5)
nRBC: 0.5 % — ABNORMAL HIGH (ref 0.0–0.2)

## 2019-07-05 LAB — CMP (CANCER CENTER ONLY)
ALT: 172 U/L — ABNORMAL HIGH (ref 0–44)
AST: 64 U/L — ABNORMAL HIGH (ref 15–41)
Albumin: 3.1 g/dL — ABNORMAL LOW (ref 3.5–5.0)
Alkaline Phosphatase: 422 U/L — ABNORMAL HIGH (ref 38–126)
Anion gap: 15 (ref 5–15)
BUN: 23 mg/dL — ABNORMAL HIGH (ref 6–20)
CO2: 24 mmol/L (ref 22–32)
Calcium: 9 mg/dL (ref 8.9–10.3)
Chloride: 106 mmol/L (ref 98–111)
Creatinine: 1.26 mg/dL — ABNORMAL HIGH (ref 0.61–1.24)
GFR, Est AFR Am: >60 mL/min (ref >60)
GFR, Estimated: >60 mL/min (ref >60)
Glucose, Bld: 147 mg/dL — ABNORMAL HIGH (ref 70–99)
Potassium: 3.8 mmol/L (ref 3.5–5.1)
Sodium: 145 mmol/L (ref 135–145)
Total Bilirubin: 0.4 mg/dL (ref 0.3–1.2)
Total Protein: 6.7 g/dL (ref 6.5–8.1)

## 2019-07-05 LAB — TSH: TSH: 1.679 u[IU]/mL (ref 0.320–4.118)

## 2019-07-05 MED ORDER — HEPARIN SOD (PORK) LOCK FLUSH 100 UNIT/ML IV SOLN
500.0000 [IU] | INTRAVENOUS | Status: AC | PRN
  Administered 2019-07-05: 500 [IU]
  Filled 2019-07-05: qty 5

## 2019-07-05 MED ORDER — HYDROMORPHONE HCL 1 MG/ML IJ SOLN
2.0000 mg | Freq: Once | INTRAMUSCULAR | Status: AC
  Administered 2019-07-05: 2 mg via INTRAVENOUS

## 2019-07-05 MED ORDER — SODIUM CHLORIDE 0.9% FLUSH
10.0000 mL | INTRAVENOUS | Status: DC | PRN
  Filled 2019-07-05: qty 10

## 2019-07-05 MED ORDER — SODIUM CHLORIDE 0.9 % IV SOLN
INTRAVENOUS | Status: AC
  Filled 2019-07-05 (×2): qty 250

## 2019-07-05 MED ORDER — SODIUM CHLORIDE 0.9% FLUSH
10.0000 mL | INTRAVENOUS | Status: DC | PRN
  Administered 2019-07-05 (×2): 10 mL via INTRAVENOUS
  Filled 2019-07-05: qty 10

## 2019-07-05 MED ORDER — HYDROMORPHONE HCL 1 MG/ML IJ SOLN
INTRAMUSCULAR | Status: AC
  Filled 2019-07-05: qty 2

## 2019-07-05 MED ORDER — HEPARIN SOD (PORK) LOCK FLUSH 100 UNIT/ML IV SOLN
500.0000 [IU] | Freq: Once | INTRAVENOUS | Status: DC
  Filled 2019-07-05: qty 5

## 2019-07-05 NOTE — Patient Instructions (Signed)

## 2019-07-06 LAB — T4: T4, Total: 7.6 ug/dL (ref 4.5–12.0)

## 2019-07-08 MED ORDER — ALUM & MAG HYDROXIDE-SIMETH 400-400-40 MG/5ML PO SUSP: 10.0000 mL | Freq: Four times a day (QID) | ORAL | 2 refills | Status: DC | PRN

## 2019-07-08 MED ORDER — LIDOCAINE-PRILOCAINE 2.5-2.5 % EX CREA: 1.0000 "application " | TOPICAL_CREAM | CUTANEOUS | 2 refills | Status: AC | PRN

## 2019-07-08 NOTE — Progress Notes (Signed)
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 Short term f/u of this lesion was recommended. CXR on 05/01/2018 performed with recommended f/u imaging. 2) 05/10/2019: presented to the ED with headache and left pelvis pain. Patient underwent CT C/A/P and MRI brain. Brain MRI showed numerous enhancing parenchymal intracranial lesions likely reflecting metastases. CT scan showed cavitary lesion in the right lung apex with spiculated margins. Additional sites of disease include lymph nodes of the chest and numerous liver mets. In the bones there were multiple lytic appearing lesions throughout the thoracic and lumbar spine as well as portions of the bony pelvis. Referred to Oncology. 3) 05/16/2019: US biopsy of the liver performed. Findings consistent with a poorly differentiated adenocarcinoma.  4) 05/19/2019: establish care with Dr. Lorenso Courier.  5) 05/30/2019: CT simulation. Received Radiation to brain/spine.  5) 06/23/2019: Received Cycle 1 Day 1 of Carbo/Pemetrexed while inpatient. Pembrolizumab not able to be administered in inpatient setting. 6) 07/12/2019: Intended Cycle 2 Day 2 of Carboplatin/Pemetrexed/Pembrolizumab    Interval History:  William Ali 34 y.o. male with medical history significant for metastatic adenocarcinoma of the lung who presents for a follow up visit. The patient's last visit was on 05/19/2019 at which time he established care. In the interim since the last visit he has had 2 hospitalizations or pain control, during the second of which he received Cycle 1 of Carbo/Pemetrexed.  On exam today Mr. Culbertson notes that his appetite has slowly been improving.  He reports that he has been trying to  eat more and that there was little suppression of appetite after receiving chemotherapy.  His improved pain control may also be helping with his appetite.  He notes that he tolerated chemotherapy quite well with only some rare episodes of diarrhea as well as some nausea, with no vomiting.  He has been having continued fatigue and spends most of his day sleeping or in bed.  In terms of his pain control he notes that his headaches have improved and that they are no longer constant and simply come and go.  He reports that most of his pain is now located in his back and his stomach.  He has been doing his best to stay hydrated and use his pain medications as prescribed.  He currently describes the pain he is experiencing as a 7 out of 10 in severity which is improved from prior to his admission to the hospital.  In terms of his anticoagulation therapy he notes he has had no issues with bleeding, bruising, or dark stools.  He reports that he has not been having constipation due to his opioid medications but does want medication on standby in case this does loosen his bowels.  He currently denies having any issues with fevers, chills, sweats, nausea, vomiting or diarrhea.  A full 10 point ROS is listed below.  MEDICAL HISTORY:  Past Medical History:  Diagnosis Date  . Back pain   . Cancer ([place])   . Hypertension     SURGICAL HISTORY: Past Surgical History:  Procedure Laterality Date  . IR IMAGING GUIDED PORT INSERTION  06/12/2019  . KNEE SURGERY      SOCIAL HISTORY: Social History   Socioeconomic History  . Marital status: Married  Spouse name: Not on file  . Number of children: Not on file  . Years of education: Not on file  . Highest education level: Not on file  Occupational History  . Not on file  Tobacco Use  . Smoking status: Never Smoker  . Smokeless tobacco: Never Used  Vaping Use  . Vaping Use: Never used  Substance and Sexual Activity  . Alcohol use: No  . Drug use: No  .  Sexual activity: Yes  Other Topics Concern  . Not on file  Social History Narrative  . Not on file   Social Determinants of Health   Financial Resource Strain:   . Difficulty of Paying Living Expenses:   Food Insecurity:   . Worried About Charity fundraiser in the Last Year:   . Arboriculturist in the Last Year:   Transportation Needs:   . Film/video editor (Medical):   Marland Kitchen Lack of Transportation (Non-Medical):   Physical Activity:   . Days of Exercise per Week:   . Minutes of Exercise per Session:   Stress:   . Feeling of Stress :   Social Connections:   . Frequency of Communication with Friends and Family:   . Frequency of Social Gatherings with Friends and Family:   . Attends Religious Services:   . Active Member of Clubs or Organizations:   . Attends Archivist Meetings:   Marland Kitchen Marital Status:   Intimate Partner Violence:   . Fear of Current or Ex-Partner:   . Emotionally Abused:   Marland Kitchen Physically Abused:   . Sexually Abused:     FAMILY HISTORY: Family History  Problem Relation Age of Onset  . Hypertension Mother   . Diabetes Father     ALLERGIES:  is allergic to onion, other, peanut oil, and peanut-containing drug products.  MEDICATIONS:  Current Outpatient Medications  Medication Sig Dispense Refill  . albuterol (VENTOLIN HFA) 108 (90 Base) MCG/ACT inhaler TAKE 2 PUFFS BY MOUTH EVERY 6 HOURS AS NEEDED FOR WHEEZE OR SHORTNESS OF BREATH 6.7 g 1  . amphetamine-dextroamphetamine (ADDERALL) 10 MG tablet Take 10 mg by mouth every morning, may take 32m additional dose daily as needed for fatigue 60 tablet 0  . celecoxib (CELEBREX) 200 MG capsule Take 1 capsule (200 mg total) by mouth daily. 30 capsule 0  . dexamethasone (DECADRON) 4 MG tablet Take 1 tablet (4 mg total) by mouth 2 (two) times daily with a meal. Starting on 5/11, and drop dose to once daily on 5/14 60 tablet 0  . DULoxetine (CYMBALTA) 30 MG capsule Take 1 capsule (30 mg total) by mouth daily.  30 capsule 0  . fentaNYL (DURAGESIC) 100 MCG/HR Place 1 patch onto the skin every 3 (three) days. 10 patch 0  . folic acid (FOLVITE) 1 MG tablet Take 1 tablet (1 mg total) by mouth daily. 60 tablet 3  . HYDROmorphone (DILAUDID) 8 MG tablet Take 1/2- 1 tablet every 4 hours as needed for pain 90 tablet 0  . metoprolol succinate (TOPROL XL) 50 MG 24 hr tablet Take 1 tablet (50 mg total) by mouth daily. Take with or immediately following a meal. 30 tablet 11  . metoprolol tartrate (LOPRESSOR) 25 MG tablet Take 1 tablet (25 mg total) by mouth 2 (two) times daily. 60 tablet 0  . nystatin (MYCOSTATIN) 100000 UNIT/ML suspension Take 5 mLs (500,000 Units total) by mouth 4 (four) times daily. 60 mL 0  . ondansetron (ZOFRAN) 4 MG tablet  Take 1 tablet (4 mg total) by mouth every 8 (eight) hours as needed for nausea or vomiting. 60 tablet 0  . pantoprazole (PROTONIX) 40 MG tablet Take 1 tablet (40 mg total) by mouth 2 (two) times daily. 60 tablet 0  . polyethylene glycol (MIRALAX / GLYCOLAX) 17 g packet Take 17 g by mouth daily. 14 each 0  . rivaroxaban (XARELTO) 20 MG TABS tablet Take 1 tablet (20 mg total) by mouth daily with supper. 30 tablet 1  . senna-docusate (SENOKOT-S) 8.6-50 MG tablet Take 2 tablets by mouth 2 (two) times daily. 120 tablet 0  . zolpidem (AMBIEN) 10 MG tablet Take 1 tablet (10 mg total) by mouth at bedtime as needed for up to 15 days for sleep. 15 tablet 0   No current facility-administered medications for this visit.    REVIEW OF SYSTEMS:   Constitutional: ( - ) fevers, ( - )  chills , ( - ) night sweats Eyes: ( - ) blurriness of vision, ( - ) double vision, ( - ) watery eyes Ears, nose, mouth, throat, and face: ( - ) mucositis, ( - ) sore throat Respiratory: ( - ) cough, ( - ) dyspnea, ( - ) wheezes Cardiovascular: ( - ) palpitation, ( - ) chest discomfort, ( - ) lower extremity swelling Gastrointestinal:  ( - ) nausea, ( - ) heartburn, ( - ) change in bowel habits Skin: ( - )  abnormal skin rashes Lymphatics: ( - ) new lymphadenopathy, ( - ) easy bruising Neurological: ( - ) numbness, ( - ) tingling, ( - ) new weaknesses Behavioral/Psych: ( - ) mood change, ( - ) new changes  All other systems were reviewed with the patient and are negative.  PHYSICAL EXAMINATION: ECOG PERFORMANCE STATUS: 2 - Symptomatic, <50% confined to bed  Vitals:   07/05/19 1105  BP: 134/85  Pulse: (!) 132  Resp: 17  Temp: (!) 97.5 F (36.4 C)  SpO2: 100%   Filed Weights   07/05/19 1105  Weight: 200 lb 3.2 oz (90.8 kg)    GENERAL: well appearing young African American male in NAD  SKIN: skin color, texture, turgor are normal, no rashes or significant lesions EYES: conjunctiva are pink and non-injected, sclera clear LUNGS: clear to auscultation and percussion with normal breathing effort HEART: regular rate & rhythm and no murmurs and no lower extremity edema Musculoskeletal: no cyanosis of digits and no clubbing  PSYCH: alert & oriented x 3, fluent speech NEURO: no focal motor/sensory deficits  LABORATORY DATA:  I have reviewed the data as listed CBC Latest Ref Rng & Units 07/05/2019 06/28/2019 06/27/2019  WBC 4.0 - 10.5 K/uL 6.1 5.3 7.0  Hemoglobin 13.0 - 17.0 g/dL 10.6(L) 9.4(L) 9.9(L)  Hematocrit 39 - 52 % 33.6(L) 29.8(L) 30.6(L)  Platelets 150 - 400 K/uL 73(L) 118(L) 130(L)    CMP Latest Ref Rng & Units 07/05/2019 06/28/2019 06/27/2019  Glucose 70 - 99 mg/dL 147(H) 154(H) 135(H)  BUN 6 - 20 mg/dL 23(H) 25(H) 25(H)  Creatinine 0.61 - 1.24 mg/dL 1.26(H) 1.16 1.12  Sodium 135 - 145 mmol/L 145 137 140  Potassium 3.5 - 5.1 mmol/L 3.8 4.7 4.5  Chloride 98 - 111 mmol/L 106 102 106  CO2 22 - 32 mmol/L _0 Calcium 8.9 - 10.3 mg/dL 9.0 8.3(L) 8.5(L)  Total Protein 6.5 - 8.1 g/dL 6.7 5.8(L) 5.9(L)  Total Bilirubin 0.3 - 1.2 mg/dL 0.4 0.7 0.9  Alkaline Phos 38 - 126 U/L 422(H)  345(H) 342(H)  AST 15 - 41 U/L 64(H) 61(H) 55(H)  ALT 0 - 44 U/L 172(H) 166(H) 166(H)      RADIOGRAPHIC STUDIES: DG Chest 2 View  Result Date: 06/17/2019 CLINICAL DATA:  Chest pain, history of prior PE EXAM: CHEST - 2 VIEW COMPARISON:  06/13/2019 FINDINGS: Cardiac shadow is within normal limits. Left lung is clear. Fullness in the right hilum and suprahilar region is noted with volume loss in the upper lobe consistent with the known mass lesion. Right chest wall port is seen in satisfactory position. IMPRESSION: Increasing fullness in the right hilar and suprahilar region with volume loss in the upper lobe slightly progressed when compared with the prior exam. Electronically Signed   By: Inez Catalina M.D.   On: 06/17/2019 14:58   CT HEAD WO CONTRAST  Result Date: 06/17/2019 CLINICAL DATA:  Known brain metastases. Worsening headache. Patient recently started anticoagulation. EXAM: CT HEAD WITHOUT CONTRAST CT CERVICAL SPINE WITHOUT CONTRAST TECHNIQUE: Multidetector CT imaging of the head and cervical spine was performed following the standard protocol without intravenous contrast. Multiplanar CT image reconstructions of the cervical spine were also generated. COMPARISON:  Jun 04, 2019 CT scan of the brain. Brain MRI May 10, 2019. FINDINGS: CT HEAD FINDINGS Brain: No subdural, epidural, or subarachnoid hemorrhage. Most of the patient's known brain metastases are not visualized on this unenhanced CT scan. There is suggestion of a few metastases, very subtle. No acute cortical ischemia or infarct. Cerebellum, brainstem, and basal cisterns are unremarkable. No midline shift or mass effect identified. Vascular: No hyperdense vessel or unexpected calcification. Skull: Suspected calvarial metastases seen on MRI are not well appreciated on this study. Sinuses/Orbits: Minimal fluid in the posterior right mastoid air cells without bony erosion. There is opacification of the sphenoid sinuses which is worsened in the interval. 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 CT CERVICAL SPINE FINDINGS Alignment: Normal. Skull base and vertebrae: No fractures identified. Relative lucency in the left lamina and pedicle at C2, extending into the spinous process with periosteal reaction. No other discrete bony lesions in the cervical spine. A lucency in the right mandibular head is well-circumscribed. Soft tissues and spinal canal: No prevertebral fluid or swelling. No visible canal hematoma. Disc levels:  Mild multilevel degenerative disc disease. Upper chest: Only the superior most aspect of the right apical mass is seen on this CT. Several small nodules in the left apex are better assessed on today's CT scan of the chest. Other: No other abnormalities. IMPRESSION: 1. 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 There is suggestion of some of the metastases, very subtle. No midline shift. Basal cisterns are patent. 2. 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 3. Suspected calvarial metastases on the previous MRI are not well seen on this study. 4. No fracture or malalignment in the cervical spine. Lucency in the left C2 lamina and pedicle extending into the spinous process with periosteal reaction suggest metastatic disease in this region. No other definitive bony metastatic disease in the cervical spine. 5. There is a rounded lucency in the right mandibular head which is nonspecific. 6. Nodularity in the lung apices. See the CT scan of the chest for better evaluation of this region. Electronically Signed   By: Dorise Bullion III M.D   On: 06/17/2019 16:37   CT ANGIO CHEST  PE W OR WO CONTRAST  Result Date: 06/17/2019 CLINICAL DATA:  Recently diagnosed metastatic lung cancer, worsening shortness of breath, chest pain, dyspnea EXAM: CT ANGIOGRAPHY CHEST WITH CONTRAST TECHNIQUE: Multidetector CT imaging of the chest was performed using the standard  protocol during bolus administration of intravenous contrast. Multiplanar CT image reconstructions and MIPs were obtained to evaluate the vascular anatomy. CONTRAST:  143m OMNIPAQUE IOHEXOL 350 MG/ML SOLN COMPARISON:  06/13/2019 FINDINGS: Cardiovascular: This is a technically adequate evaluation of the pulmonary vasculature. The chronic right lower lobe pulmonary emboli seen previously are again noted and unchanged. There are no acute pulmonary emboli. The right hilar mass results in extrinsic compression of the right upper lobe pulmonary artery, unchanged since prior exam. No pericardial effusion.  The thoracic aorta remains unremarkable. Mediastinum/Nodes: Mediastinal and right hilar adenopathy unchanged since prior exam. Index lymph node in the right paratracheal region image 22 measures 11 mm in short axis, stable since previous study. Right hilar mass measures 2.5 cm reference image 37, previously measuring approximately 2.2 cm. No new adenopathy. The thyroid, trachea, and esophagus are grossly normal. Lungs/Pleura: Thick-walled cavitary right upper lobe mass is again noted, measuring approximately 2.8 x 3.6 cm reference image 23, previously measuring 3.4 x 2.2 cm by my measurement. Findings are consistent with primary malignancy. Progressive wedge-shaped consolidation within the right upper lobe likely reflects pulmonary infarct given the extrinsic compression on the right upper lobe pulmonary artery by the infiltrative right hilar mass. Subcentimeter nodule left upper lobe image 28 and superior segment left lower lobe image 39 unchanged. There are trace bilateral pleural effusions right greater than left. No pneumothorax. There is narrowing of the right upper lobe bronchus and segmental airways due to the infiltrative right hilar mass described above, similar to previous study. Upper Abdomen: Numerous liver metastases are noted, please refer to CT abdomen report. Musculoskeletal: Mottled appearance of the  thoracic spine consistent with bony metastases, stable since prior study. Pathologic T3 fracture is seen with mild retropulsion resulting in at least 50% narrowing of the central canal. There is likely an associated soft tissue component. No other pathologic fractures. Review of the MIP images confirms the above findings. IMPRESSION: 1. 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 No acute pulmonary embolus. 2. Cavitating right upper lobe mass consistent with lung cancer. Minimal increase in size. 3. Marked right hilar and mediastinal adenopathy as above, without significant change. There is extrinsic compression upon the right upper lobe pulmonary artery and narrowing of the right upper lobe bronchus due to the right hilar mass, stable. 4. Progressive right upper lobe pulmonary infarct due to the extrinsic compression on the right upper lobe pulmonary artery. 5. Diffuse bony metastases. Pathologic T3 fracture with resulting central canal narrowing as above. Please refer to CT thoracic spine report. 6. Trace bilateral pleural effusions. 7. Liver metastases.  Please refer to abdominal CT report. Electronically Signed   By: MRanda NgoM.D.   On: 06/17/2019 16:30   CT Angio Chest PE W and/or Wo Contrast  Result Date: 06/13/2019 CLINICAL DATA:  Shortness of breath. Chest pain. Metastatic lung carcinoma. Active chemotherapy and radiation. EXAM: CT ANGIOGRAPHY CHEST WITH CONTRAST TECHNIQUE: Multidetector CT imaging of the chest was performed using the standard protocol during bolus administration of intravenous contrast. Multiplanar CT image reconstructions and MIPs were obtained to evaluate the vascular anatomy. CONTRAST:  1049mOMNIPAQUE IOHEXOL 350 MG/ML SOLN COMPARISON:  Chest radiograph earlier this day. Most recent chest CT 05/10/2019 FINDINGS: Cardiovascular: Right hilar adenopathy/soft tissue  density causes severe narrowing of the right upper lobe pulmonary arteries which are attenuated. This  causes mass effect on the distal main right pulmonary artery. Circumferential soft tissue density involving the right lower lobar pulmonary artery. Filling defects within the segmental branches of the right lower lobe pulmonary arteries are eccentric and may be due to direct invasion or de novo pulmonary embolus, for example series 6, image 145 and 149. No definite pulmonary emboli on the left, however breathing motion artifact and contrast bolus timing limits detailed assessment. Thoracic aorta is normal in caliber without dissection. Right chest port tip in the lower SVC. Heart is normal in size. No pericardial effusion. Mediastinum/Nodes: Progressive right hilar adenopathy/soft tissue density causes severe narrowing of the right upper lobe pulmonary arteries. This is increased from prior exam. Representative right hilar lymph node measures 22 mm, series 5, image 59, previously 19 mm. This is contiguous with right paratracheal soft tissue densities/adenopathy which is difficult to delineate. Right lower paratracheal node has increased currently 13 mm, series 5, image 52, previously 10 mm. High mediastinal node measures 11 mm, series 5, image 37, unchanged. No left hilar adenopathy. Esophagus slightly patulous without wall thickening. No visualized thyroid nodule. Lungs/Pleura: Spiculated right upper lobe cavitary nodule currently measuring approximately 3 x 2.6 cm with decreased peripheral soft tissue component from prior exam. This is contiguous with soft tissue density that extends to the right hilum. Severe narrowing of the right upper lobe bronchus. Ill-defined paramediastinal density in the right upper lobe is likely post treatment related change. Peripheral subpleural opacity in the right upper lobe abutting, series 7, image 57 is nonspecific, and may represent pulmonary infarct, post treatment related change, or pneumonia. Small left apical nodule series 7, image 39 and 40 are unchanged. Small nodule in the  superior segment of the left lower lobe is unchanged, series 7, image 55. 3 mm perifissural nodule in the right middle lobe, series 7, image 64, unchanged. No definite new nodule. No pleural fluid. Upper Abdomen: Known metastatic disease in the liver, not as well visualized on the current exam given phase of contrast. No acute abdominal findings. 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 T3 lesion causing mild pathologic compression fracture, similar to prior. No new spinal compression fractures. Review of the MIP images confirms the above findings. IMPRESSION: 1. Progressive right hilar adenopathy/soft tissue density which causes severe narrowing of the right upper lobe pulmonary arteries which are attenuated. Small segmental pulmonary emboli in the right lower lobe are adjacent to adenopathy, may represent direct invasion versus did no novo clot. Thromboembolic burden is small. No pulmonary embolus on the left, however breathing motion artifact and contrast bolus timing limits detailed assessment. 2. Spiculated right upper lobe cavitary nodule with decreased peripheral soft tissue component from prior exam and slight decrease in size. 3. Progressive right hilar adenopathy/soft tissue density causes severe narrowing of the right upper lobe bronchus. There may be an element of post treatment related change, however some of the mediastinal nodes have increased in size suspicious for progression in disease. 4. Additional small pulmonary nodules both lungs are unchanged from exam last month. 5. Osseous metastatic disease is grossly unchanged. Known hepatic metastatic disease is not well delineated given phase of contrast These results were called by telephone at the time of interpretation on 06/13/2019 at 1:52 pm to [place] , who verbally acknowledged these results. Electronically Signed   By: Keith Rake M.D.   On: 06/13/2019 13:53   CT  CERVICAL SPINE WO CONTRAST  Result Date:  06/17/2019 CLINICAL DATA:  Known brain metastases. Worsening headache. Patient recently started anticoagulation. EXAM: CT HEAD WITHOUT CONTRAST CT CERVICAL SPINE WITHOUT CONTRAST TECHNIQUE: Multidetector CT imaging of the head and cervical spine was performed following the standard protocol without intravenous contrast. Multiplanar CT image reconstructions of the cervical spine were also generated. COMPARISON:  Jun 04, 2019 CT scan of the brain. Brain MRI May 10, 2019. FINDINGS: CT HEAD FINDINGS Brain: No subdural, epidural, or subarachnoid hemorrhage. Most of the patient's known brain metastases are not visualized on this unenhanced CT scan. There is suggestion of a few metastases, very subtle. No acute cortical ischemia or infarct. Cerebellum, brainstem, and basal cisterns are unremarkable. No midline shift or mass effect identified. Vascular: No hyperdense vessel or unexpected calcification. Skull: Suspected calvarial metastases seen on MRI are not well appreciated on this study. Sinuses/Orbits: Minimal fluid in the posterior right mastoid air cells without bony erosion. There is opacification of the sphenoid sinuses which is worsened in the interval. 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 CT CERVICAL SPINE FINDINGS Alignment: Normal. Skull base and vertebrae: No fractures identified. Relative lucency in the left lamina and pedicle at C2, extending into the spinous process with periosteal reaction. No other discrete bony lesions in the cervical spine. A lucency in the right mandibular head is well-circumscribed. Soft tissues and spinal canal: No prevertebral fluid or swelling. No visible canal hematoma. Disc levels:  Mild multilevel degenerative disc disease. Upper chest: Only the superior most aspect of the right apical mass is seen on this CT. Several small nodules in the left apex are better assessed on today's CT scan of  the chest. Other: No other abnormalities. IMPRESSION: 1. 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 There is suggestion of some of the metastases, very subtle. No midline shift. Basal cisterns are patent. 2. 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 3. Suspected calvarial metastases on the previous MRI are not well seen on this study. 4. No fracture or malalignment in the cervical spine. Lucency in the left C2 lamina and pedicle extending into the spinous process with periosteal reaction suggest metastatic disease in this region. No other definitive bony metastatic disease in the cervical spine. 5. There is a rounded lucency in the right mandibular head which is nonspecific. 6. Nodularity in the lung apices. See the CT scan of the chest for better evaluation of this region. Electronically Signed   By: Dorise Bullion III M.D   On: 06/17/2019 16:37   MR Cervical Spine Wo Contrast  Result Date: 06/17/2019 CLINICAL DATA:  34 year old male with widely metastatic lung cancer. Back pain. EXAM: MRI CERVICAL SPINE WITHOUT CONTRAST TECHNIQUE: Multiplanar, multisequence MR imaging of the cervical spine was performed. No intravenous contrast was administered. COMPARISON:  CT Thoracic Spine, Lumbar Spine, CT Chest, Abdomen, and Pelvis yesterday. Brain MRI 05/10/2019. FINDINGS: Alignment: Overall straightening of cervical lordosis. Vertebrae: Metastatic disease to bone at virtually all cervical spine levels, and throughout the visible skull base. Only the C1 vertebra seems to be spared at this time. Anterior and posterior element metastases in the cervical spine. No pathologic cervical vertebral fracture, although there is epidural and extraosseous extension of tumor from the left C2 posterior elements (series 13, image 4) with severe involvement of the left C3 neural foramen. No significant  cervical spinal stenosis. See thoracic spine  findings reported separately. Cord: Spinal cord signal is within normal limits at all visualized levels. No IV contrast administered. Posterior Fossa, vertebral arteries, paraspinal tissues: Cervicomedullary junction is within normal limits. Abnormal T2 and STIR hyperintensity in the visible right cerebellar hemisphere compatible with known brain metastases. No posterior fossa mass effect is evident. Preserved major vascular flow voids in the neck. There is inflammation of the C2-C3 level posterior paraspinal muscles greater on the left which is probably related to the C2 extraosseous tumor. Disc levels: Incidental degenerative changes including: C3-C4: Small leftward disc herniation with mild spinal stenosis and mild spinal cord mass effect. C4-C5: Disc bulge and small rightward disc herniation with mild spinal stenosis and mild right hemi cord mass effect. Associated moderate to severe right C5 foraminal stenosis. IMPRESSION: 1. Widespread osseous metastatic disease throughout the cervical spine and the visible skull base. 2. Epidural and extraosseous extension of tumor from the left C2 posterior elements with severe involvement of the left C3 neural foramen, but no malignant spinal stenosis. There is mild degenerative cervical spinal stenosis at C3-C4 and C4-C5. 3. No pathologic cervical vertebral fracture. 4. See Thoracic Spine findings reported separately. Electronically Signed   By: Genevie Ann M.D.   On: 06/17/2019 21:59   MR THORACIC SPINE WO CONTRAST  Result Date: 06/17/2019 CLINICAL DATA:  34 year old male with widely metastatic lung cancer. Back pain. EXAM: MRI THORACIC SPINE WITHOUT CONTRAST TECHNIQUE: Multiplanar, multisequence MR imaging of the thoracic spine was performed. No intravenous contrast was administered. COMPARISON:  Cervical MRI today reported separately. Thoracic spine CT, CT Chest, Abdomen, and Pelvis yesterday. FINDINGS: Limited cervical spine imaging:  Reported separately today.  Thoracic spine segmentation:  Normal. Alignment:  Straightening of thoracic kyphosis. Vertebrae: Thoracic vertebral metastases at every level. Subtotal T1 vertebral replacement by tumor. Early extraosseous extension from the spinous process into the paraspinal muscles. T3 vertebral body pathologic compression fracture with 50% central loss of vertebral body height, and some retropulsed bone/tumor. Additionally, there is evidence of some epidural tumor extension at that level (series 34, image 15) resulting in mild spinal stenosis, borderline to mild cord compression. Moderate left T3 neural foraminal stenosis. Epidural tumor also at the T5 level where the vertebra is completely replaced by tumor. Abundant extraosseous extension of tumor also from the posterior elements into the posterior paraspinal musculature. Mild spinal stenosis and borderline to mild cord compression. T6 near complete vertebral replacement by tumor with ventral and foraminal epidural tumor extension. When superimposed on epidural lipomatosis there is mild spinal stenosis and cord compression (series 34, image 28). Moderate to severe right T6 foraminal involvement. Ventral epidural tumor extension at T7 without cord compression at this time (series 34, image 32). Ventral epidural tumor extension at T9 with mild spinal stenosis and borderline to mild cord compression at this time (image 42). T10 right side posterior element extraosseous extension of tumor into the paraspinal muscles. Cord: No thoracic cord signal abnormality despite the multiple levels of epidural tumor. Paraspinal and other soft tissues: Abnormal right upper lung and mediastinum. Small layering right pleural effusion. Partially visible liver metastases. Disc levels: There are some superimposed thoracic degenerative changes, including: -Mild degenerative thoracic spinal stenosis at T8-T9 related to left eccentric disc and endplate degeneration, mild to moderate posterior element  degeneration. -borderline to mild degenerative spinal stenosis at T11-T12 related to right paracentral disc protrusion and mild facet hypertrophy. IMPRESSION: 1. Bone metastases involving every thoracic spine level. Pathologic fracture of T3 with  up to 50% central vertebral loss of height, mild retropulsion of bone in tumor. 2. Ventral epidural tumor with up to mild spinal stenosis and borderline to mild cord compression at: T3, T5, T6, T7, T9. 3. Moderate or severe neural foraminal involvement by tumor at the left T3, right T6 nerve levels. 4. Superimposed mild degenerative thoracic spinal stenosis at T8-T9 and T11-T12. Electronically Signed   By: Genevie Ann M.D.   On: 06/17/2019 22:09   MR LUMBAR SPINE WO CONTRAST  Result Date: 06/17/2019 CLINICAL DATA:  34 year old male with widely metastatic lung cancer. Back pain. EXAM: MRI LUMBAR SPINE WITHOUT CONTRAST TECHNIQUE: Multiplanar, multisequence MR imaging of the lumbar spine was performed. No intravenous contrast was administered. COMPARISON:  Thoracic spine MRI today. Lumbar CT, CT Chest, Abdomen, and Pelvis yesterday. Lumbar MRI 05/10/2019. FINDINGS: Segmentation: Normal, concordant with the thoracic spine numbering today. Alignment:  Stable lumbar lordosis since April. Vertebrae: Lumbar metastatic disease at all levels. Compared to 05/10/2019 lumbar vertebral tumor shows mild generalized progression. Confluent sacral and medial iliac bone metastatic disease redemonstrated. Mild pathologic fracture of the L2 superior endplate. This has only mildly progressed since April (series 38, image 9). No other lumbar pathologic fracture. And although there is some early lumbar extraosseous tumor extension (right L4 transverse process series 39, image 25), there is no significant lumbar epidural or neural foraminal tumor at this time. However, there is obliteration of the right S1, left S2 and S3 neural foramina by tumor. Conus medullaris and cauda equina: Conus extends to  the T12-L1 level. No lower spinal cord or conus signal abnormality. Cauda equina nerve roots appear normal in the absence of contrast. Paraspinal and other soft tissues: Stable visible abdominal viscera. Disc levels: Lumbar epidural lipomatosis. Superimposed lumbar degenerative changes most notable for: -L3-L4 left paracentral disc protrusion superimposed on disc bulge, posterior element hypertrophy and epidural lipomatosis. Moderate left lateral recess stenosis (left L4 nerve level) and mild spinal stenosis. IMPRESSION: 1. Lumbar metastatic disease at all levels with mild generalized progression since the April MRI. Mild pathologic fracture of the L2 superior endplate. 2. Occasional early extraosseous extension of tumor (right L4 transverse process), but no lumbar epidural or neural foraminal tumor at this time. 3. Bulky sacral tumor re-demonstrated including severe involvement of the right S1, left S2, and left S3 neural foramina. Electronically Signed   By: Genevie Ann M.D.   On: 06/17/2019 22:16   US Abdomen Complete  Result Date: 06/21/2019 CLINICAL DATA:  Postprandial abdominal pain. Metastatic lung cancer. EXAM: ABDOMEN ULTRASOUND COMPLETE COMPARISON:  CT abdomen pelvis 05/10/2019 FINDINGS: Gallbladder: No gallstones or wall thickening visualized. No sonographic Murphy sign noted by sonographer. Common bile duct: Diameter: 2.7 mm Liver: Liver parenchyma demonstrates increased echogenicity diffusely. Multiple liver masses are present compatible metastatic disease as noted on prior CT. Largest lesions in the right lobe measure 3.7 x 2.6 x 1.7 cm, and 3.8 x 3.5 x 3.5 cm. Multiple smaller lesions also present in the liver. Portal vein is patent on color Doppler imaging with normal direction of blood flow towards the liver. IVC: No abnormality visualized. Pancreas: Visualized portion unremarkable. Spleen: Size and appearance within normal limits. Right Kidney: Length: 9.7 cm. Echogenicity within normal limits. No  mass or hydronephrosis visualized. Left Kidney: Length: 10.4 cm. Echogenicity within normal limits. No mass or hydronephrosis visualized. Abdominal aorta: No aneurysm visualized. Other findings: Small right pleural effusion IMPRESSION: Negative for gallstones Multiple hepatic metastasis as noted on CT. Fatty infiltration liver is noted. Small right  pleural effusion. Electronically Signed   By: Franchot Gallo M.D.   On: 06/21/2019 09:08   CT ABDOMEN PELVIS W CONTRAST  Result Date: 06/17/2019 CLINICAL DATA:  Metastatic lung cancer, chest pain, lower and mid back pain EXAM: CT ABDOMEN AND PELVIS WITH CONTRAST TECHNIQUE: Multidetector CT imaging of the abdomen and pelvis was performed using the standard protocol following bolus administration of intravenous contrast. CONTRAST:  142m OMNIPAQUE IOHEXOL 350 MG/ML SOLN COMPARISON:  05/10/2019 FINDINGS: Lower chest: Please refer to corresponding chest CT report describing right upper lobe malignancy, right hilar and mediastinal adenopathy, and chronic right lower lobe pulmonary emboli. Hepatobiliary: Innumerable hepatic metastases are again noted, increased in size. Largest index lesion within the caudate measures 4.9 cm image 18, previously measuring 2.8 cm image 47. No biliary dilation.  Gallbladder is unremarkable. Pancreas: Unremarkable. No pancreatic ductal dilatation or surrounding inflammatory changes. Spleen: Normal in size without focal abnormality. Adrenals/Urinary Tract: Adrenal glands are unremarkable. Kidneys are normal, without renal calculi, focal lesion, or hydronephrosis. Bladder is unremarkable. Stomach/Bowel: No bowel obstruction or ileus. No bowel wall thickening or inflammatory changes. Vascular/Lymphatic: No pathologic adenopathy within the abdomen or pelvis. Vascular structures are unremarkable. Reproductive: Prostate is unremarkable. Other: No free fluid or free gas.  No abdominal wall hernia. Musculoskeletal: Progressive diffuse bony metastases  are seen throughout the bilateral hips, lumbar spine, sacrum, and bony pelvis. Please refer to dedicated CT lumbar spine describing findings related to the lumbar spine and sacrum. Increased size of the expansile left iliac crest lesion with associated soft tissue component. There are no pathologic fractures. IMPRESSION: 1. Progressive liver metastases as above. 2. Progressive bony metastases involving the lumbar spine, bony pelvis, and sacrum. Please refer to corresponding CT lumbar spine report for important findings in that region. Electronically Signed   By: MRanda NgoM.D.   On: 06/17/2019 16:46   CT T-SPINE NO CHARGE  Result Date: 06/17/2019 CLINICAL DATA:  Metastatic lung cancer, back pain EXAM: CT THORACIC SPINE WITHOUT CONTRAST TECHNIQUE: Multidetector CT images of the thoracic were obtained using the standard protocol without intravenous contrast. COMPARISON:  06/13/2019 FINDINGS: Alignment: Alignment is anatomic. Vertebrae: Diffuse metastatic disease is seen throughout the entirety of the thoracic spine. There is moderate appearance and destruction of the T3 vertebral body with pathologic fracture centrally with 50% loss of vertebral body height. Paraspinal and other soft tissues: Please refer to corresponding chest CT report describing a cavitating right upper lobe mass, right hilar mass, and diffuse mediastinal adenopathy. Trace bilateral pleural effusions are noted. There are chronic right lower lobe segmental pulmonary emboli. Disc levels: As result of the T3 pathologic fracture and metastatic disease described above, there is approximately 50% narrowing of the central canal at T3. At T8 and T9, there are expansile metastatic lesions resulting in bowing of the posterior margin of the vertebral bodies with estimated 50-75% narrowing of the central canal and likely cord compression. IMPRESSION: 1. Diffuse bony metastases throughout the entirety of the thoracic spine, most pronounced at T3, T8,  and T9 with resulting central canal narrowing and likely cord compression. 2. Pathologic fracture of the T3 vertebral body, with 50% loss of height centrally. 3. Please refer to CT chest report describing cavitating right upper lobe mass, right hilar mass, and mediastinal adenopathy. Electronically Signed   By: MRanda NgoM.D.   On: 06/17/2019 16:40   CT L-SPINE NO CHARGE  Result Date: 06/17/2019 CLINICAL DATA:  Low back pain, metastatic lung cancer EXAM: CT LUMBAR SPINE WITHOUT CONTRAST  TECHNIQUE: Multidetector CT imaging of the lumbar spine was performed without intravenous contrast administration. Multiplanar CT image reconstructions were also generated. COMPARISON:  06/13/2019 FINDINGS: Segmentation: There are 5 non-rib-bearing lumbar type vertebral bodies. Alignment: Alignment is anatomic. Vertebrae: There is diffuse metastatic disease, with multiple lytic lesions throughout the lumbar spine. These are most pronounced within the left aspect of the L1 vertebral body extending into the left pedicle, anterior aspect of the L2 vertebral body, right L3 transverse process and central aspect L3 vertebral body, right L4 pedicle and transverse process. Additional lytic lesions are seen within the bilateral iliac bones and sacrum. Bony destruction and soft tissue mass obliterate the left S2/S3 and S3/S4 neural foramen. I do not see any pathologic fracture within the lumbar spine. Paraspinal and other soft tissues: Soft tissue mass results in obliteration of the left S2/S3 and S3/S4 neural foramen. Likely soft tissue component arising from the L1 metastatic lesion with slight enlargement of the left psoas muscle. Disc levels: There is multilevel lumbar spondylosis. Mild central canal stenosis is seen at L1/L2, L2/L3, and L3/L4. At L3/L4 there is left predominant lateral recess and neural foraminal encroachment as well. IMPRESSION: 1. Diffuse bony metastatic disease throughout the lumbar spine, sacrum, and  bilateral iliac bones as above. 2. There are no pathologic fractures. Soft tissue mass and bony destruction obliterate the left S2/S3 and S3/S4 neural foramen. 3. Multilevel lumbar spondylosis most pronounced at L3/L4 with left predominant neural foraminal and lateral recess encroachment. Electronically Signed   By: Randa Ngo M.D.   On: 06/17/2019 16:35   DG Chest Portable 1 View  Result Date: 06/13/2019 CLINICAL DATA:  Shortness of breath and chest pain EXAM: PORTABLE CHEST 1 VIEW COMPARISON:  Jun 02, 2019 FINDINGS: Port-A-Cath tip is in the superior vena cava. No pneumothorax. Lungs are clear. Heart size and pulmonary vascularity are normal. No adenopathy. No bone lesions. IMPRESSION: Port-A-Cath tip in superior vena cava. No pneumothorax. Lungs clear. Cardiac silhouette within normal limits. Electronically Signed   By: Lowella Grip III M.D.   On: 06/13/2019 10:54   IR IMAGING GUIDED PORT INSERTION  Result Date: 06/12/2019 INDICATION: 34 year old male with a history of metastatic right upper lobe lung cancer. He presents for port catheter placement. EXAM: IMPLANTED PORT A CATH PLACEMENT WITH ULTRASOUND AND FLUOROSCOPIC GUIDANCE MEDICATIONS: Ancef 2 g; The antibiotic was administered within an appropriate time interval prior to skin puncture. ANESTHESIA/SEDATION: Versed 4 mg IV; Fentanyl 100 mcg IV; Moderate Sedation Time:  19 minutes The patient was continuously monitored during the procedure by the interventional radiology nurse under my direct supervision. FLUOROSCOPY TIME:  0 minutes, 30 seconds (7 mGy) COMPLICATIONS: None immediate. PROCEDURE: The right neck and chest was prepped with chlorhexidine, and draped in the usual sterile fashion using maximum barrier technique (cap and mask, sterile gown, sterile gloves, large sterile sheet, hand hygiene and cutaneous antiseptic). Local anesthesia was attained by infiltration with 1% lidocaine with epinephrine. Ultrasound demonstrated patency of the  right internal jugular vein, and this was documented with an image. Under real-time ultrasound guidance, this vein was accessed with a 21 gauge micropuncture needle and image documentation was performed. A small dermatotomy was made at the access site with an 11 scalpel. A 0.018" wire was advanced into the SVC and the access needle exchanged for a 69F micropuncture vascular sheath. The 0.018" wire was then removed and a 0.035" wire advanced into the IVC. An appropriate location for the subcutaneous reservoir was selected below the clavicle and an  incision was made through the skin and underlying soft tissues. The subcutaneous tissues were then dissected using a combination of blunt and sharp surgical technique and a pocket was formed. A single lumen power injectable portacatheter was then tunneled through the subcutaneous tissues from the pocket to the dermatotomy and the port reservoir placed within the subcutaneous pocket. The venous access site was then serially dilated and a peel away vascular sheath placed over the wire. The wire was removed and the port catheter advanced into position under fluoroscopic guidance. The catheter tip is positioned in the superior cavoatrial junction. This was documented with a spot image. The portacatheter was then tested and found to flush and aspirate well. The port was flushed with saline followed by 100 units/mL heparinized saline. The pocket was then closed in two layers using first subdermal inverted interrupted absorbable sutures followed by a running subcuticular suture. The epidermis was then sealed with Dermabond. The dermatotomy at the venous access site was also closed with Dermabond. IMPRESSION: Successful placement of a right IJ approach Power Port with ultrasound and fluoroscopic guidance. The catheter is ready for use. Electronically Signed   By: Jacqulynn Cadet M.D.   On: 06/12/2019 16:26    ASSESSMENT & PLAN Gayle Isidoro Donning 34 y.o. male with medical history  significant for metastatic adenocarcinoma of the lung who presents for a follow up visit.  The patient has successfully started cycle 1 of carboplatin and pemetrexed on 06/23/2019.  Unfortunately this had to be administered in the inpatient setting because the patient was admitted for pain control.  As we were unsure when he would be discharged we decided to move forward with the treatment at that time.  Pembrolizumab was not able to be administered in house due to logistical and insurance issues.  The plan is currently for him to receive pembrolizumab during cycle 2 which starts on 07/14/2019.  We are beginning to get better control of his pain with fentanyl patches as well as Dilaudid p.o. with some additional medications including Celebrex and Cymbalta.  Pain control has been slowly improving.  In terms of appetite the patient has been eating more but his weight still continues to decline.  He is off steroid therapy as he believes this was causing his abdominal pain to worsen.  Diagnostically the patient had foundation 1 PD-L1 testing set for his tumor and unfortunately no targetable mutations were noted.  Given his young age and the aggressive nature of this cancer I would recommend we get guardant 360 in order to try to detect any possible targetable mutations.  This was discussed with the lung expert onsite who notes that one test or the other can miss mutations in up to 15% of patients.  We will plan to have this drawn during his next clinic visit.  Once again I made it clear to Mr. Millette that all treatments for administering are palliative in nature and designed to shrink the tumor and reduce symptoms.  The goal is to extend life and provide him some quality of life.  Once again we reiterated that there is no cure for his disease and that all steps moving forward should be made with this in mind.  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 Pathology could not definitively say, but he had a prior nodule on CXR and mass in lung appears to be the primary lesion --tissue sent for  Foundation One and PD-L1 immunohistochemistry with no targetable mutations noted. Will send for Guardant 360 with next blood collection. --started Carbo/Pem/Pem (regimen detailed above) Cycle 1 Day 1 on 06/23/2019. Cycle 1 administered inpatient, pembrolizumab was not able to be added at that time. Plan for Cycle 2 to start on 07/12/2019 with pembroliuzmab.  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 and radiation oncology are following, Appreciate assistance in management of brain mets.  --pain control as noted below.  #Pain Control --pain is predominately in the back, head, and abdomen. Of note, headaches have been improving  --patient has d/c steroid therapy as it made abdominal pain worse --currently on fentanyl patches 13mg q 72H --Taking dilaudid PO [id number] PRN --additionally is taking cymablata 3066mO and celebrex 200m41mily  --senna docusate/PRN miralax for opioid induced constipation  #Osseus Lesions/Hip Pain --oxycodone as above --recommend evaluation by radiation oncology  --encourage dental evaluation  --will administer IV zometa at next chemotherapy treatment  #Left Eye Vision Changes --[facility] vs occipital lobe lesion --currently using eye patch over left eye.  --continue to monitor   # Weight Loss --encourage high calorie diet --possible increase appetite and weight from steroid therapy --continue to monitor  No orders of the defined types were placed in this encounter.   All questions were  answered. The patient knows to call the clinic with any problems, questions or concerns.  A total of more than 40 minutes were spent on this encounter and over half of that time was spent on counseling and coordination of care as outlined above.   JohnLedell Peoples Department of Hematology/Oncology [facility]: [phone]: [phone]: [email or web address]  07/08/2019 1:59 PM

## 2019-07-11 ENCOUNTER — Other Ambulatory Visit: Payer: Self-pay | Admitting: Hematology and Oncology

## 2019-07-11 DIAGNOSIS — C3491 Malignant neoplasm of unspecified part of right bronchus or lung: Secondary | ICD-10-CM

## 2019-07-11 DIAGNOSIS — C7949 Secondary malignant neoplasm of other parts of nervous system: Secondary | ICD-10-CM

## 2019-07-11 MED FILL — Dexamethasone Sodium Phosphate Inj 100 MG/10ML: INTRAMUSCULAR | Qty: 1 | Status: AC

## 2019-07-11 MED FILL — Fosaprepitant Dimeglumine For IV Infusion 150 MG (Base Eq): INTRAVENOUS | Qty: 5 | Status: AC

## 2019-07-11 NOTE — Progress Notes (Deleted)
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 Short term f/u of this lesion was recommended. CXR on 05/01/2018 performed with recommended f/u imaging. 2) 05/10/2019: presented to the ED with headache and left pelvis pain. Patient underwent CT C/A/P and MRI brain. Brain MRI showed numerous enhancing parenchymal intracranial lesions likely reflecting metastases. CT scan showed cavitary lesion in the right lung apex with spiculated margins. Additional sites of disease include lymph nodes of the chest and numerous liver mets. In the bones there were multiple lytic appearing lesions throughout the thoracic and lumbar spine as well as portions of the bony pelvis. Referred to Oncology. 3) 05/16/2019: US biopsy of the liver performed. Findings consistent with a poorly differentiated adenocarcinoma.  4) 05/19/2019: establish care with Dr. Lorenso Courier.  5) 05/30/2019: CT simulation. Received Radiation to brain/spine.  5) 06/23/2019: Received Cycle 1 Day 1 of Carbo/Pemetrexed while inpatient. Pembrolizumab not able to be administered in inpatient setting. 6) 07/12/2019: Intended Cycle 2 Day 2 of Carboplatin/Pemetrexed/Pembrolizumab    Interval History:  William Ali 34 y.o. male with medical history significant for metastatic adenocarcinoma of the lung who presents for a follow up visit. The patient's last visit was on 07/05/2019 at which time he was evaluated as a post hospital/post cycle 1 clinic visit. In the interim since the last visit he has had no ED visits or symptom management visits.   On exam today Mr. Episcopo notes ***  In terms of his pain control he notes that his headaches have improved and that they are no  longer constant and simply come and go.  He reports that most of his pain is now located in his back and his stomach.  He has been doing his best to stay hydrated and use his pain medications as prescribed.  He currently describes the pain he is experiencing as a 7 out of 10 in severity which is improved from prior to his admission to the hospital.  In terms of his anticoagulation therapy he notes he has had no issues with bleeding, bruising, or dark stools.  He reports that he has not been having constipation due to his opioid medications but does want medication on standby in case this does loosen his bowels.  He currently denies having any issues with fevers, chills, sweats, nausea, vomiting or diarrhea.  A full 10 point ROS is listed below.  MEDICAL HISTORY:  Past Medical History:  Diagnosis Date  . Back pain   . Cancer ([place])   . Hypertension     SURGICAL HISTORY: Past Surgical History:  Procedure Laterality Date  . IR IMAGING GUIDED PORT INSERTION  06/12/2019  . KNEE SURGERY      SOCIAL HISTORY: Social History   Socioeconomic History  . Marital status: Married    Spouse name: Not on file  . Number of children: Not on file  . Years of education: Not on file  . Highest education level: Not on file  Occupational History  . Not on file  Tobacco Use  . Smoking status: Never Smoker  . Smokeless tobacco: Never Used  Vaping Use  . Vaping Use: Never used  Substance and Sexual Activity  . Alcohol use: No  . Drug use: No  . Sexual activity:  Yes  Other Topics Concern  . Not on file  Social History Narrative  . Not on file   Social Determinants of Health   Financial Resource Strain:   . Difficulty of Paying Living Expenses:   Food Insecurity:   . Worried About Charity fundraiser in the Last Year:   . Arboriculturist in the Last Year:   Transportation Needs:   . Film/video editor (Medical):   Marland Kitchen Lack of Transportation (Non-Medical):   Physical Activity:   . Days of  Exercise per Week:   . Minutes of Exercise per Session:   Stress:   . Feeling of Stress :   Social Connections:   . Frequency of Communication with Friends and Family:   . Frequency of Social Gatherings with Friends and Family:   . Attends Religious Services:   . Active Member of Clubs or Organizations:   . Attends Archivist Meetings:   Marland Kitchen Marital Status:   Intimate Partner Violence:   . Fear of Current or Ex-Partner:   . Emotionally Abused:   Marland Kitchen Physically Abused:   . Sexually Abused:     FAMILY HISTORY: Family History  Problem Relation Age of Onset  . Hypertension Mother   . Diabetes Father     ALLERGIES:  is allergic to onion, other, peanut oil, and peanut-containing drug products.  MEDICATIONS:  Current Outpatient Medications  Medication Sig Dispense Refill  . albuterol (VENTOLIN HFA) 108 (90 Base) MCG/ACT inhaler TAKE 2 PUFFS BY MOUTH EVERY 6 HOURS AS NEEDED FOR WHEEZE OR SHORTNESS OF BREATH 6.7 g 1  . alum & mag hydroxide-simeth (MAALOX MULTI SYMPTOM MAX ST) 400-400-40 MG/5ML suspension Take 10 mLs by mouth every 6 (six) hours as needed for indigestion. 355 mL 2  . amphetamine-dextroamphetamine (ADDERALL) 10 MG tablet Take 10 mg by mouth every morning, may take 63m additional dose daily as needed for fatigue 60 tablet 0  . celecoxib (CELEBREX) 200 MG capsule Take 1 capsule (200 mg total) by mouth daily. 30 capsule 0  . dexamethasone (DECADRON) 4 MG tablet Take 1 tablet (4 mg total) by mouth 2 (two) times daily with a meal. Starting on 5/11, and drop dose to once daily on 5/14 60 tablet 0  . DULoxetine (CYMBALTA) 30 MG capsule Take 1 capsule (30 mg total) by mouth daily. 30 capsule 0  . fentaNYL (DURAGESIC) 100 MCG/HR Place 1 patch onto the skin every 3 (three) days. 10 patch 0  . folic acid (FOLVITE) 1 MG tablet Take 1 tablet (1 mg total) by mouth daily. 60 tablet 3  . HYDROmorphone (DILAUDID) 8 MG tablet Take 1/2- 1 tablet every 4 hours as needed for pain 90  tablet 0  . lidocaine-prilocaine (EMLA) cream Apply 1 application topically as needed. 30 g 2  . metoprolol succinate (TOPROL XL) 50 MG 24 hr tablet Take 1 tablet (50 mg total) by mouth daily. Take with or immediately following a meal. 30 tablet 11  . metoprolol tartrate (LOPRESSOR) 25 MG tablet Take 1 tablet (25 mg total) by mouth 2 (two) times daily. 60 tablet 0  . nystatin (MYCOSTATIN) 100000 UNIT/ML suspension Take 5 mLs (500,000 Units total) by mouth 4 (four) times daily. 60 mL 0  . ondansetron (ZOFRAN) 4 MG tablet Take 1 tablet (4 mg total) by mouth every 8 (eight) hours as needed for nausea or vomiting. 60 tablet 0  . pantoprazole (PROTONIX) 40 MG tablet Take 1 tablet (40 mg total) by mouth  2 (two) times daily. 60 tablet 0  . polyethylene glycol (MIRALAX / GLYCOLAX) 17 g packet Take 17 g by mouth daily. 14 each 0  . rivaroxaban (XARELTO) 20 MG TABS tablet Take 1 tablet (20 mg total) by mouth daily with supper. 30 tablet 1  . senna-docusate (SENOKOT-S) 8.6-50 MG tablet Take 2 tablets by mouth 2 (two) times daily. 120 tablet 0  . zolpidem (AMBIEN) 10 MG tablet Take 1 tablet (10 mg total) by mouth at bedtime as needed for up to 15 days for sleep. 15 tablet 0   No current facility-administered medications for this visit.    REVIEW OF SYSTEMS:   Constitutional: ( - ) fevers, ( - )  chills , ( - ) night sweats Eyes: ( - ) blurriness of vision, ( - ) double vision, ( - ) watery eyes Ears, nose, mouth, throat, and face: ( - ) mucositis, ( - ) sore throat Respiratory: ( - ) cough, ( - ) dyspnea, ( - ) wheezes Cardiovascular: ( - ) palpitation, ( - ) chest discomfort, ( - ) lower extremity swelling Gastrointestinal:  ( - ) nausea, ( - ) heartburn, ( - ) change in bowel habits Skin: ( - ) abnormal skin rashes Lymphatics: ( - ) new lymphadenopathy, ( - ) easy bruising Neurological: ( - ) numbness, ( - ) tingling, ( - ) new weaknesses Behavioral/Psych: ( - ) mood change, ( - ) new changes  All  other systems were reviewed with the patient and are negative.  PHYSICAL EXAMINATION: ECOG PERFORMANCE STATUS: 2 - Symptomatic, <50% confined to bed  There were no vitals filed for this visit. There were no vitals filed for this visit.  GENERAL: well appearing young African American male in NAD  SKIN: skin color, texture, turgor are normal, no rashes or significant lesions EYES: conjunctiva are pink and non-injected, sclera clear LUNGS: clear to auscultation and percussion with normal breathing effort HEART: regular rate & rhythm and no murmurs and no lower extremity edema Musculoskeletal: no cyanosis of digits and no clubbing  PSYCH: alert & oriented x 3, fluent speech NEURO: no focal motor/sensory deficits  LABORATORY DATA:  I have reviewed the data as listed CBC Latest Ref Rng & Units 07/05/2019 06/28/2019 06/27/2019  WBC 4.0 - 10.5 K/uL 6.1 5.3 7.0  Hemoglobin 13.0 - 17.0 g/dL 10.6(L) 9.4(L) 9.9(L)  Hematocrit 39 - 52 % 33.6(L) 29.8(L) 30.6(L)  Platelets 150 - 400 K/uL 73(L) 118(L) 130(L)    CMP Latest Ref Rng & Units 07/05/2019 06/28/2019 06/27/2019  Glucose 70 - 99 mg/dL 147(H) 154(H) 135(H)  BUN 6 - 20 mg/dL 23(H) 25(H) 25(H)  Creatinine 0.61 - 1.24 mg/dL 1.26(H) 1.16 1.12  Sodium 135 - 145 mmol/L 145 137 140  Potassium 3.5 - 5.1 mmol/L 3.8 4.7 4.5  Chloride 98 - 111 mmol/L 106 102 106  CO2 22 - 32 mmol/L _0 Calcium 8.9 - 10.3 mg/dL 9.0 8.3(L) 8.5(L)  Total Protein 6.5 - 8.1 g/dL 6.7 5.8(L) 5.9(L)  Total Bilirubin 0.3 - 1.2 mg/dL 0.4 0.7 0.9  Alkaline Phos 38 - 126 U/L 422(H) 345(H) 342(H)  AST 15 - 41 U/L 64(H) 61(H) 55(H)  ALT 0 - 44 U/L 172(H) 166(H) 166(H)     RADIOGRAPHIC STUDIES: DG Chest 2 View  Result Date: 06/17/2019 CLINICAL DATA:  Chest pain, history of prior PE EXAM: CHEST - 2 VIEW COMPARISON:  06/13/2019 FINDINGS: Cardiac shadow is within normal limits. Left lung is clear. Fullness  in the right hilum and suprahilar region is noted with volume loss in the  upper lobe consistent with the known mass lesion. Right chest wall port is seen in satisfactory position. IMPRESSION: Increasing fullness in the right hilar and suprahilar region with volume loss in the upper lobe slightly progressed when compared with the prior exam. Electronically Signed   By: Inez Catalina M.D.   On: 06/17/2019 14:58   CT HEAD WO CONTRAST  Result Date: 06/17/2019 CLINICAL DATA:  Known brain metastases. Worsening headache. Patient recently started anticoagulation. EXAM: CT HEAD WITHOUT CONTRAST CT CERVICAL SPINE WITHOUT CONTRAST TECHNIQUE: Multidetector CT imaging of the head and cervical spine was performed following the standard protocol without intravenous contrast. Multiplanar CT image reconstructions of the cervical spine were also generated. COMPARISON:  Jun 04, 2019 CT scan of the brain. Brain MRI May 10, 2019. FINDINGS: CT HEAD FINDINGS Brain: No subdural, epidural, or subarachnoid hemorrhage. Most of the patient's known brain metastases are not visualized on this unenhanced CT scan. There is suggestion of a few metastases, very subtle. No acute cortical ischemia or infarct. Cerebellum, brainstem, and basal cisterns are unremarkable. No midline shift or mass effect identified. Vascular: No hyperdense vessel or unexpected calcification. Skull: Suspected calvarial metastases seen on MRI are not well appreciated on this study. Sinuses/Orbits: Minimal fluid in the posterior right mastoid air cells without bony erosion. There is opacification of the sphenoid sinuses which is worsened in the interval. 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 CT CERVICAL SPINE FINDINGS Alignment: Normal. Skull base and vertebrae: No fractures identified. Relative lucency in the left lamina and pedicle at C2, extending into the spinous process with periosteal reaction. No other discrete bony lesions in the cervical spine. A  lucency in the right mandibular head is well-circumscribed. Soft tissues and spinal canal: No prevertebral fluid or swelling. No visible canal hematoma. Disc levels:  Mild multilevel degenerative disc disease. Upper chest: Only the superior most aspect of the right apical mass is seen on this CT. Several small nodules in the left apex are better assessed on today's CT scan of the chest. Other: No other abnormalities. IMPRESSION: 1. 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 There is suggestion of some of the metastases, very subtle. No midline shift. Basal cisterns are patent. 2. 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 3. Suspected calvarial metastases on the previous MRI are not well seen on this study. 4. No fracture or malalignment in the cervical spine. Lucency in the left C2 lamina and pedicle extending into the spinous process with periosteal reaction suggest metastatic disease in this region. No other definitive bony metastatic disease in the cervical spine. 5. There is a rounded lucency in the right mandibular head which is nonspecific. 6. Nodularity in the lung apices. See the CT scan of the chest for better evaluation of this region. Electronically Signed   By: Dorise Bullion III M.D   On: 06/17/2019 16:37   CT ANGIO CHEST PE W OR WO CONTRAST  Result Date: 06/17/2019 CLINICAL DATA:  Recently diagnosed metastatic lung cancer, worsening shortness of breath, chest pain, dyspnea EXAM: CT ANGIOGRAPHY CHEST WITH CONTRAST TECHNIQUE: Multidetector CT imaging of the chest was performed using the standard protocol during bolus administration of intravenous contrast. Multiplanar CT image reconstructions and MIPs were obtained to evaluate the vascular anatomy. CONTRAST:  128m OMNIPAQUE IOHEXOL 350 MG/ML SOLN COMPARISON:  06/13/2019 FINDINGS: Cardiovascular: This is a technically adequate evaluation of the pulmonary  vasculature. The chronic right lower lobe pulmonary emboli seen previously are again noted and unchanged. There are no acute pulmonary emboli. The right hilar mass results in extrinsic compression of the right upper lobe pulmonary artery, unchanged since prior exam. No pericardial effusion.  The thoracic aorta remains unremarkable. Mediastinum/Nodes: Mediastinal and right hilar adenopathy unchanged since prior exam. Index lymph node in the right paratracheal region image 22 measures 11 mm in short axis, stable since previous study. Right hilar mass measures 2.5 cm reference image 37, previously measuring approximately 2.2 cm. No new adenopathy. The thyroid, trachea, and esophagus are grossly normal. Lungs/Pleura: Thick-walled cavitary right upper lobe mass is again noted, measuring approximately 2.8 x 3.6 cm reference image 23, previously measuring 3.4 x 2.2 cm by my measurement. Findings are consistent with primary malignancy. Progressive wedge-shaped consolidation within the right upper lobe likely reflects pulmonary infarct given the extrinsic compression on the right upper lobe pulmonary artery by the infiltrative right hilar mass. Subcentimeter nodule left upper lobe image 28 and superior segment left lower lobe image 39 unchanged. There are trace bilateral pleural effusions right greater than left. No pneumothorax. There is narrowing of the right upper lobe bronchus and segmental airways due to the infiltrative right hilar mass described above, similar to previous study. Upper Abdomen: Numerous liver metastases are noted, please refer to CT abdomen report. Musculoskeletal: Mottled appearance of the thoracic spine consistent with bony metastases, stable since prior study. Pathologic T3 fracture is seen with mild retropulsion resulting in at least 50% narrowing of the central canal. There is likely an associated soft tissue component. No other pathologic fractures. Review of the MIP images confirms the above  findings. IMPRESSION: 1. 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 No acute pulmonary embolus. 2. Cavitating right upper lobe mass consistent with lung cancer. Minimal increase in size. 3. Marked right hilar and mediastinal adenopathy as above, without significant change. There is extrinsic compression upon the right upper lobe pulmonary artery and narrowing of the right upper lobe bronchus due to the right hilar mass, stable. 4. Progressive right upper lobe pulmonary infarct due to the extrinsic compression on the right upper lobe pulmonary artery. 5. Diffuse bony metastases. Pathologic T3 fracture with resulting central canal narrowing as above. Please refer to CT thoracic spine report. 6. Trace bilateral pleural effusions. 7. Liver metastases.  Please refer to abdominal CT report. Electronically Signed   By: MRanda NgoM.D.   On: 06/17/2019 16:30   CT Angio Chest PE W and/or Wo Contrast  Result Date: 06/13/2019 CLINICAL DATA:  Shortness of breath. Chest pain. Metastatic lung carcinoma. Active chemotherapy and radiation. EXAM: CT ANGIOGRAPHY CHEST WITH CONTRAST TECHNIQUE: Multidetector CT imaging of the chest was performed using the standard protocol during bolus administration of intravenous contrast. Multiplanar CT image reconstructions and MIPs were obtained to evaluate the vascular anatomy. CONTRAST:  1037mOMNIPAQUE IOHEXOL 350 MG/ML SOLN COMPARISON:  Chest radiograph earlier this day. Most recent chest CT 05/10/2019 FINDINGS: Cardiovascular: Right hilar adenopathy/soft tissue density causes severe narrowing of the right upper lobe pulmonary arteries which are attenuated. This causes mass effect on the distal main right pulmonary artery. Circumferential soft tissue density involving the right lower lobar pulmonary artery. Filling defects within the segmental branches of the right lower lobe pulmonary arteries are eccentric and may be due to direct invasion or de novo pulmonary embolus, for  example series 6, image 145 and 149. No definite pulmonary emboli on the left, however breathing motion artifact and contrast bolus timing limits detailed assessment. Thoracic aorta is normal in caliber without dissection. Right chest port tip in the lower SVC. Heart is normal in size. No pericardial effusion. Mediastinum/Nodes: Progressive right hilar adenopathy/soft tissue density causes severe narrowing of the right upper lobe pulmonary arteries. This is increased from prior exam. Representative right hilar lymph node measures 22 mm, series 5, image 59, previously 19 mm. This is contiguous with right paratracheal soft tissue densities/adenopathy which is difficult to delineate. Right lower paratracheal node has increased currently 13 mm, series 5, image 52, previously 10 mm. High mediastinal node measures 11 mm, series 5, image 37, unchanged. No left hilar adenopathy. Esophagus slightly patulous without wall thickening. No visualized thyroid nodule. Lungs/Pleura: Spiculated right upper lobe cavitary nodule currently measuring approximately 3 x 2.6 cm with decreased peripheral soft tissue component from prior exam. This is contiguous with soft tissue density that extends to the right hilum. Severe narrowing of the right upper lobe bronchus. Ill-defined paramediastinal density in the right upper lobe is likely post treatment related change. Peripheral subpleural opacity in the right upper lobe abutting, series 7, image 57 is nonspecific, and may represent pulmonary infarct, post treatment related change, or pneumonia. Small left apical nodule series 7, image 39 and 40 are unchanged. Small nodule in the superior segment of the left lower lobe is unchanged, series 7, image 55. 3 mm perifissural nodule in the right middle lobe, series 7, image 64, unchanged. No definite new nodule. No pleural fluid. Upper Abdomen: Known metastatic disease in the liver, not as well visualized on the current exam given phase of  contrast. No acute abdominal findings. 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 T3 lesion causing mild pathologic compression fracture, similar to prior. No new spinal compression fractures. Review of the MIP images confirms the above findings. IMPRESSION: 1. Progressive right hilar adenopathy/soft tissue density which causes severe narrowing of the right upper lobe pulmonary arteries which are attenuated. Small segmental pulmonary emboli in the right lower lobe are adjacent to adenopathy, may represent direct invasion versus did no novo clot. Thromboembolic burden is small. No pulmonary embolus on the left, however breathing motion artifact and contrast bolus timing limits detailed assessment. 2. Spiculated right upper lobe cavitary nodule with decreased peripheral soft tissue component from prior exam and slight decrease in size. 3. Progressive right hilar adenopathy/soft tissue density causes severe narrowing of the right upper lobe bronchus. There may be an element of post treatment related change, however some of the mediastinal nodes have increased in size suspicious for progression in disease. 4. Additional small pulmonary nodules both lungs are unchanged from exam last month. 5. Osseous metastatic disease is grossly unchanged. Known hepatic metastatic disease is not well delineated given phase of contrast These results were called by telephone at the time of interpretation on 06/13/2019 at 1:52 pm to [place] , who verbally acknowledged these results. Electronically Signed   By: Keith Rake M.D.   On: 06/13/2019 13:53   CT CERVICAL SPINE WO CONTRAST  Result Date: 06/17/2019 CLINICAL DATA:  Known brain metastases. Worsening headache. Patient recently started anticoagulation. EXAM: CT HEAD WITHOUT CONTRAST CT CERVICAL SPINE WITHOUT CONTRAST TECHNIQUE: Multidetector CT imaging of the head and cervical spine was performed following the standard protocol without  intravenous contrast. Multiplanar CT image reconstructions of the cervical spine were also generated. COMPARISON:  Jun 04, 2019 CT scan of the brain. Brain MRI May 10, 2019. FINDINGS: CT HEAD FINDINGS Brain: No subdural, epidural, or subarachnoid hemorrhage. Most of the patient's known brain metastases are not visualized on this unenhanced CT scan. There is suggestion of a few metastases, very subtle. No acute cortical ischemia or infarct. Cerebellum, brainstem, and basal cisterns are unremarkable. No midline shift or mass effect identified. Vascular: No hyperdense vessel or unexpected calcification. Skull: Suspected calvarial metastases seen on MRI are not well appreciated on this study. Sinuses/Orbits: Minimal fluid in the posterior right mastoid air cells without bony erosion. There is opacification of the sphenoid sinuses which is worsened in the interval. 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 CT CERVICAL SPINE FINDINGS Alignment: Normal. Skull base and vertebrae: No fractures identified. Relative lucency in the left lamina and pedicle at C2, extending into the spinous process with periosteal reaction. No other discrete bony lesions in the cervical spine. A lucency in the right mandibular head is well-circumscribed. Soft tissues and spinal canal: No prevertebral fluid or swelling. No visible canal hematoma. Disc levels:  Mild multilevel degenerative disc disease. Upper chest: Only the superior most aspect of the right apical mass is seen on this CT. Several small nodules in the left apex are better assessed on today's CT scan of the chest. Other: No other abnormalities. IMPRESSION: 1. 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 There is suggestion of some of the metastases, very subtle. No midline shift. Basal cisterns are patent. 2. The patient's known left retinal  metastasis is extremely subtle on this study. 3. Suspected calvarial metastases on the previous MRI are not well seen on this study. 4. No fracture or malalignment in the cervical spine. Lucency in the left C2 lamina and pedicle extending into the spinous process with periosteal reaction suggest metastatic disease in this region. No other definitive bony metastatic disease in the cervical spine. 5. There is a rounded lucency in the right mandibular head which is nonspecific. 6. Nodularity in the lung apices. See the CT scan of the chest for better evaluation of this region. Electronically Signed   By: Dorise Bullion III M.D   On: 06/17/2019 16:37   MR Cervical Spine Wo Contrast  Result Date: 06/17/2019 CLINICAL DATA:  34 year old male with widely metastatic lung cancer. Back pain. EXAM: MRI CERVICAL SPINE WITHOUT CONTRAST TECHNIQUE: Multiplanar, multisequence MR imaging of the cervical spine was performed. No intravenous contrast was administered. COMPARISON:  CT Thoracic Spine, Lumbar Spine, CT Chest, Abdomen, and Pelvis yesterday. Brain MRI 05/10/2019. FINDINGS: Alignment: Overall straightening of cervical lordosis. Vertebrae: Metastatic disease to bone at virtually all cervical spine levels, and throughout the visible skull base. Only the C1 vertebra seems to be spared at this time. Anterior and posterior element metastases in the cervical spine. No pathologic cervical vertebral fracture, although there is epidural and extraosseous extension of tumor from the left C2 posterior elements (series 13, image 4) with severe involvement of the left C3 neural foramen. No significant cervical spinal stenosis. See thoracic spine findings reported separately. Cord: Spinal cord signal is within normal limits at all visualized levels. No IV contrast administered. Posterior Fossa, vertebral arteries, paraspinal tissues: Cervicomedullary junction is within normal limits. Abnormal T2 and STIR hyperintensity in the visible  right cerebellar hemisphere compatible with known brain metastases. No posterior fossa mass effect is evident. Preserved major vascular flow  voids in the neck. There is inflammation of the C2-C3 level posterior paraspinal muscles greater on the left which is probably related to the C2 extraosseous tumor. Disc levels: Incidental degenerative changes including: C3-C4: Small leftward disc herniation with mild spinal stenosis and mild spinal cord mass effect. C4-C5: Disc bulge and small rightward disc herniation with mild spinal stenosis and mild right hemi cord mass effect. Associated moderate to severe right C5 foraminal stenosis. IMPRESSION: 1. Widespread osseous metastatic disease throughout the cervical spine and the visible skull base. 2. Epidural and extraosseous extension of tumor from the left C2 posterior elements with severe involvement of the left C3 neural foramen, but no malignant spinal stenosis. There is mild degenerative cervical spinal stenosis at C3-C4 and C4-C5. 3. No pathologic cervical vertebral fracture. 4. See Thoracic Spine findings reported separately. Electronically Signed   By: Genevie Ann M.D.   On: 06/17/2019 21:59   MR THORACIC SPINE WO CONTRAST  Result Date: 06/17/2019 CLINICAL DATA:  34 year old male with widely metastatic lung cancer. Back pain. EXAM: MRI THORACIC SPINE WITHOUT CONTRAST TECHNIQUE: Multiplanar, multisequence MR imaging of the thoracic spine was performed. No intravenous contrast was administered. COMPARISON:  Cervical MRI today reported separately. Thoracic spine CT, CT Chest, Abdomen, and Pelvis yesterday. FINDINGS: Limited cervical spine imaging:  Reported separately today. Thoracic spine segmentation:  Normal. Alignment:  Straightening of thoracic kyphosis. Vertebrae: Thoracic vertebral metastases at every level. Subtotal T1 vertebral replacement by tumor. Early extraosseous extension from the spinous process into the paraspinal muscles. T3 vertebral body pathologic  compression fracture with 50% central loss of vertebral body height, and some retropulsed bone/tumor. Additionally, there is evidence of some epidural tumor extension at that level (series 34, image 15) resulting in mild spinal stenosis, borderline to mild cord compression. Moderate left T3 neural foraminal stenosis. Epidural tumor also at the T5 level where the vertebra is completely replaced by tumor. Abundant extraosseous extension of tumor also from the posterior elements into the posterior paraspinal musculature. Mild spinal stenosis and borderline to mild cord compression. T6 near complete vertebral replacement by tumor with ventral and foraminal epidural tumor extension. When superimposed on epidural lipomatosis there is mild spinal stenosis and cord compression (series 34, image 28). Moderate to severe right T6 foraminal involvement. Ventral epidural tumor extension at T7 without cord compression at this time (series 34, image 32). Ventral epidural tumor extension at T9 with mild spinal stenosis and borderline to mild cord compression at this time (image 42). T10 right side posterior element extraosseous extension of tumor into the paraspinal muscles. Cord: No thoracic cord signal abnormality despite the multiple levels of epidural tumor. Paraspinal and other soft tissues: Abnormal right upper lung and mediastinum. Small layering right pleural effusion. Partially visible liver metastases. Disc levels: There are some superimposed thoracic degenerative changes, including: -Mild degenerative thoracic spinal stenosis at T8-T9 related to left eccentric disc and endplate degeneration, mild to moderate posterior element degeneration. -borderline to mild degenerative spinal stenosis at T11-T12 related to right paracentral disc protrusion and mild facet hypertrophy. IMPRESSION: 1. Bone metastases involving every thoracic spine level. Pathologic fracture of T3 with up to 50% central vertebral loss of height, mild  retropulsion of bone in tumor. 2. Ventral epidural tumor with up to mild spinal stenosis and borderline to mild cord compression at: T3, T5, T6, T7, T9. 3. Moderate or severe neural foraminal involvement by tumor at the left T3, right T6 nerve levels. 4. Superimposed mild degenerative thoracic spinal stenosis at T8-T9 and T11-T12. Electronically  Signed   By: Genevie Ann M.D.   On: 06/17/2019 22:09   MR LUMBAR SPINE WO CONTRAST  Result Date: 06/17/2019 CLINICAL DATA:  34 year old male with widely metastatic lung cancer. Back pain. EXAM: MRI LUMBAR SPINE WITHOUT CONTRAST TECHNIQUE: Multiplanar, multisequence MR imaging of the lumbar spine was performed. No intravenous contrast was administered. COMPARISON:  Thoracic spine MRI today. Lumbar CT, CT Chest, Abdomen, and Pelvis yesterday. Lumbar MRI 05/10/2019. FINDINGS: Segmentation: Normal, concordant with the thoracic spine numbering today. Alignment:  Stable lumbar lordosis since April. Vertebrae: Lumbar metastatic disease at all levels. Compared to 05/10/2019 lumbar vertebral tumor shows mild generalized progression. Confluent sacral and medial iliac bone metastatic disease redemonstrated. Mild pathologic fracture of the L2 superior endplate. This has only mildly progressed since April (series 38, image 9). No other lumbar pathologic fracture. And although there is some early lumbar extraosseous tumor extension (right L4 transverse process series 39, image 25), there is no significant lumbar epidural or neural foraminal tumor at this time. However, there is obliteration of the right S1, left S2 and S3 neural foramina by tumor. Conus medullaris and cauda equina: Conus extends to the T12-L1 level. No lower spinal cord or conus signal abnormality. Cauda equina nerve roots appear normal in the absence of contrast. Paraspinal and other soft tissues: Stable visible abdominal viscera. Disc levels: Lumbar epidural lipomatosis. Superimposed lumbar degenerative changes most  notable for: -L3-L4 left paracentral disc protrusion superimposed on disc bulge, posterior element hypertrophy and epidural lipomatosis. Moderate left lateral recess stenosis (left L4 nerve level) and mild spinal stenosis. IMPRESSION: 1. Lumbar metastatic disease at all levels with mild generalized progression since the April MRI. Mild pathologic fracture of the L2 superior endplate. 2. Occasional early extraosseous extension of tumor (right L4 transverse process), but no lumbar epidural or neural foraminal tumor at this time. 3. Bulky sacral tumor re-demonstrated including severe involvement of the right S1, left S2, and left S3 neural foramina. Electronically Signed   By: Genevie Ann M.D.   On: 06/17/2019 22:16   US Abdomen Complete  Result Date: 06/21/2019 CLINICAL DATA:  Postprandial abdominal pain. Metastatic lung cancer. EXAM: ABDOMEN ULTRASOUND COMPLETE COMPARISON:  CT abdomen pelvis 05/10/2019 FINDINGS: Gallbladder: No gallstones or wall thickening visualized. No sonographic Murphy sign noted by sonographer. Common bile duct: Diameter: 2.7 mm Liver: Liver parenchyma demonstrates increased echogenicity diffusely. Multiple liver masses are present compatible metastatic disease as noted on prior CT. Largest lesions in the right lobe measure 3.7 x 2.6 x 1.7 cm, and 3.8 x 3.5 x 3.5 cm. Multiple smaller lesions also present in the liver. Portal vein is patent on color Doppler imaging with normal direction of blood flow towards the liver. IVC: No abnormality visualized. Pancreas: Visualized portion unremarkable. Spleen: Size and appearance within normal limits. Right Kidney: Length: 9.7 cm. Echogenicity within normal limits. No mass or hydronephrosis visualized. Left Kidney: Length: 10.4 cm. Echogenicity within normal limits. No mass or hydronephrosis visualized. Abdominal aorta: No aneurysm visualized. Other findings: Small right pleural effusion IMPRESSION: Negative for gallstones Multiple hepatic metastasis as  noted on CT. Fatty infiltration liver is noted. Small right pleural effusion. Electronically Signed   By: Franchot Gallo M.D.   On: 06/21/2019 09:08   CT ABDOMEN PELVIS W CONTRAST  Result Date: 06/17/2019 CLINICAL DATA:  Metastatic lung cancer, chest pain, lower and mid back pain EXAM: CT ABDOMEN AND PELVIS WITH CONTRAST TECHNIQUE: Multidetector CT imaging of the abdomen and pelvis was performed using the standard protocol following bolus administration  of intravenous contrast. CONTRAST:  149m OMNIPAQUE IOHEXOL 350 MG/ML SOLN COMPARISON:  05/10/2019 FINDINGS: Lower chest: Please refer to corresponding chest CT report describing right upper lobe malignancy, right hilar and mediastinal adenopathy, and chronic right lower lobe pulmonary emboli. Hepatobiliary: Innumerable hepatic metastases are again noted, increased in size. Largest index lesion within the caudate measures 4.9 cm image 18, previously measuring 2.8 cm image 47. No biliary dilation.  Gallbladder is unremarkable. Pancreas: Unremarkable. No pancreatic ductal dilatation or surrounding inflammatory changes. Spleen: Normal in size without focal abnormality. Adrenals/Urinary Tract: Adrenal glands are unremarkable. Kidneys are normal, without renal calculi, focal lesion, or hydronephrosis. Bladder is unremarkable. Stomach/Bowel: No bowel obstruction or ileus. No bowel wall thickening or inflammatory changes. Vascular/Lymphatic: No pathologic adenopathy within the abdomen or pelvis. Vascular structures are unremarkable. Reproductive: Prostate is unremarkable. Other: No free fluid or free gas.  No abdominal wall hernia. Musculoskeletal: Progressive diffuse bony metastases are seen throughout the bilateral hips, lumbar spine, sacrum, and bony pelvis. Please refer to dedicated CT lumbar spine describing findings related to the lumbar spine and sacrum. Increased size of the expansile left iliac crest lesion with associated soft tissue component. There are no  pathologic fractures. IMPRESSION: 1. Progressive liver metastases as above. 2. Progressive bony metastases involving the lumbar spine, bony pelvis, and sacrum. Please refer to corresponding CT lumbar spine report for important findings in that region. Electronically Signed   By: MRanda NgoM.D.   On: 06/17/2019 16:46   CT T-SPINE NO CHARGE  Result Date: 06/17/2019 CLINICAL DATA:  Metastatic lung cancer, back pain EXAM: CT THORACIC SPINE WITHOUT CONTRAST TECHNIQUE: Multidetector CT images of the thoracic were obtained using the standard protocol without intravenous contrast. COMPARISON:  06/13/2019 FINDINGS: Alignment: Alignment is anatomic. Vertebrae: Diffuse metastatic disease is seen throughout the entirety of the thoracic spine. There is moderate appearance and destruction of the T3 vertebral body with pathologic fracture centrally with 50% loss of vertebral body height. Paraspinal and other soft tissues: Please refer to corresponding chest CT report describing a cavitating right upper lobe mass, right hilar mass, and diffuse mediastinal adenopathy. Trace bilateral pleural effusions are noted. There are chronic right lower lobe segmental pulmonary emboli. Disc levels: As result of the T3 pathologic fracture and metastatic disease described above, there is approximately 50% narrowing of the central canal at T3. At T8 and T9, there are expansile metastatic lesions resulting in bowing of the posterior margin of the vertebral bodies with estimated 50-75% narrowing of the central canal and likely cord compression. IMPRESSION: 1. Diffuse bony metastases throughout the entirety of the thoracic spine, most pronounced at T3, T8, and T9 with resulting central canal narrowing and likely cord compression. 2. Pathologic fracture of the T3 vertebral body, with 50% loss of height centrally. 3. Please refer to CT chest report describing cavitating right upper lobe mass, right hilar mass, and mediastinal adenopathy.  Electronically Signed   By: MRanda NgoM.D.   On: 06/17/2019 16:40   CT L-SPINE NO CHARGE  Result Date: 06/17/2019 CLINICAL DATA:  Low back pain, metastatic lung cancer EXAM: CT LUMBAR SPINE WITHOUT CONTRAST TECHNIQUE: Multidetector CT imaging of the lumbar spine was performed without intravenous contrast administration. Multiplanar CT image reconstructions were also generated. COMPARISON:  06/13/2019 FINDINGS: Segmentation: There are 5 non-rib-bearing lumbar type vertebral bodies. Alignment: Alignment is anatomic. Vertebrae: There is diffuse metastatic disease, with multiple lytic lesions throughout the lumbar spine. These are most pronounced within the left aspect of the L1 vertebral body  extending into the left pedicle, anterior aspect of the L2 vertebral body, right L3 transverse process and central aspect L3 vertebral body, right L4 pedicle and transverse process. Additional lytic lesions are seen within the bilateral iliac bones and sacrum. Bony destruction and soft tissue mass obliterate the left S2/S3 and S3/S4 neural foramen. I do not see any pathologic fracture within the lumbar spine. Paraspinal and other soft tissues: Soft tissue mass results in obliteration of the left S2/S3 and S3/S4 neural foramen. Likely soft tissue component arising from the L1 metastatic lesion with slight enlargement of the left psoas muscle. Disc levels: There is multilevel lumbar spondylosis. Mild central canal stenosis is seen at L1/L2, L2/L3, and L3/L4. At L3/L4 there is left predominant lateral recess and neural foraminal encroachment as well. IMPRESSION: 1. Diffuse bony metastatic disease throughout the lumbar spine, sacrum, and bilateral iliac bones as above. 2. There are no pathologic fractures. Soft tissue mass and bony destruction obliterate the left S2/S3 and S3/S4 neural foramen. 3. Multilevel lumbar spondylosis most pronounced at L3/L4 with left predominant neural foraminal and lateral recess encroachment.  Electronically Signed   By: Randa Ngo M.D.   On: 06/17/2019 16:35   DG Chest Portable 1 View  Result Date: 06/13/2019 CLINICAL DATA:  Shortness of breath and chest pain EXAM: PORTABLE CHEST 1 VIEW COMPARISON:  Jun 02, 2019 FINDINGS: Port-A-Cath tip is in the superior vena cava. No pneumothorax. Lungs are clear. Heart size and pulmonary vascularity are normal. No adenopathy. No bone lesions. IMPRESSION: Port-A-Cath tip in superior vena cava. No pneumothorax. Lungs clear. Cardiac silhouette within normal limits. Electronically Signed   By: Lowella Grip III M.D.   On: 06/13/2019 10:54   IR IMAGING GUIDED PORT INSERTION  Result Date: 06/12/2019 INDICATION: 34 year old male with a history of metastatic right upper lobe lung cancer. He presents for port catheter placement. EXAM: IMPLANTED PORT A CATH PLACEMENT WITH ULTRASOUND AND FLUOROSCOPIC GUIDANCE MEDICATIONS: Ancef 2 g; The antibiotic was administered within an appropriate time interval prior to skin puncture. ANESTHESIA/SEDATION: Versed 4 mg IV; Fentanyl 100 mcg IV; Moderate Sedation Time:  19 minutes The patient was continuously monitored during the procedure by the interventional radiology nurse under my direct supervision. FLUOROSCOPY TIME:  0 minutes, 30 seconds (7 mGy) COMPLICATIONS: None immediate. PROCEDURE: The right neck and chest was prepped with chlorhexidine, and draped in the usual sterile fashion using maximum barrier technique (cap and mask, sterile gown, sterile gloves, large sterile sheet, hand hygiene and cutaneous antiseptic). Local anesthesia was attained by infiltration with 1% lidocaine with epinephrine. Ultrasound demonstrated patency of the right internal jugular vein, and this was documented with an image. Under real-time ultrasound guidance, this vein was accessed with a 21 gauge micropuncture needle and image documentation was performed. A small dermatotomy was made at the access site with an 11 scalpel. A 0.018" wire was  advanced into the SVC and the access needle exchanged for a 79F micropuncture vascular sheath. The 0.018" wire was then removed and a 0.035" wire advanced into the IVC. An appropriate location for the subcutaneous reservoir was selected below the clavicle and an incision was made through the skin and underlying soft tissues. The subcutaneous tissues were then dissected using a combination of blunt and sharp surgical technique and a pocket was formed. A single lumen power injectable portacatheter was then tunneled through the subcutaneous tissues from the pocket to the dermatotomy and the port reservoir placed within the subcutaneous pocket. The venous access site was then serially  dilated and a peel away vascular sheath placed over the wire. The wire was removed and the port catheter advanced into position under fluoroscopic guidance. The catheter tip is positioned in the superior cavoatrial junction. This was documented with a spot image. The portacatheter was then tested and found to flush and aspirate well. The port was flushed with saline followed by 100 units/mL heparinized saline. The pocket was then closed in two layers using first subdermal inverted interrupted absorbable sutures followed by a running subcuticular suture. The epidermis was then sealed with Dermabond. The dermatotomy at the venous access site was also closed with Dermabond. IMPRESSION: Successful placement of a right IJ approach Power Port with ultrasound and fluoroscopic guidance. The catheter is ready for use. Electronically Signed   By: Jacqulynn Cadet M.D.   On: 06/12/2019 16:26    ASSESSMENT & PLAN William Ali 34 y.o. male with medical history significant for metastatic adenocarcinoma of the lung who presents for a follow up visit.  The patient has successfully completed Cycle 1 of carboplatin and pemetrexed, started on 06/23/2019.  Unfortunately this had to be administered in the inpatient setting because the patient was  admitted for pain control.  As we were unsure when he would be discharged we decided to move forward with the treatment at that time.  Pembrolizumab was not able to be administered in house due to logistical and insurance issues.  The plan is currently for him to receive pembrolizumab during Cycle 2 which starts today.  We are beginning to get better control of his pain with fentanyl patches as well as Dilaudid p.o. with some additional medications including Celebrex and Cymbalta.  Pain control has been slowly improving.  In terms of appetite the patient has been eating more but his weight still continues to decline.  He is off steroid therapy as he believes this was causing his abdominal pain to worsen.  Diagnostically the patient had Foundation One PD-L1 testing set for his tumor and unfortunately no targetable mutations were noted.  Given his young age and the aggressive nature of this cancer I would recommend we get Guardant 360 in order to try to detect any possible targetable mutations.  This was discussed with the lung expert onsite who notes that one test or the other can miss mutations in up to 15% of patients.  We will plan to have this drawn during his next clinic visit.  Previously I made it clear to Mr. Nunn that all treatments for administering are palliative in nature and designed to shrink the tumor and reduce symptoms.  The goal is to extend life and provide him some quality of life.  Once again we reiterated that there is no cure for his disease and that all steps moving forward should be made with this in mind.  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 Pathology could not definitively say, but he had a prior nodule on CXR and mass in lung appears to be the primary lesion --tissue sent for Foundation One and PD-L1 immunohistochemistry with no targetable  mutations noted. Will send for Guardant 360 with blood collection today --started Carbo/Pem/Pem (regimen detailed above) Cycle 1 Day 1 on 06/23/2019. Cycle 1 administered inpatient, pembrolizumab was not able to be added at that time. Plan for Cycle 2 to start today with pembroliuzmab.  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 and radiation oncology are following, Appreciate assistance in management of brain mets.  --pain control as noted below.  #Pain Control --pain is predominately in the back, head, and abdomen. Of note, headaches have been improving  --patient has d/c steroid therapy as it made abdominal pain worse --currently on fentanyl patches 128mg q 72H --Taking dilaudid PO [id number] PRN --additionally is taking cymablata 3012mO and celebrex 200m91mily  --senna docusate/PRN miralax for opioid induced constipation  #Osseus Lesions/Hip Pain --oxycodone as above --recommend evaluation by radiation oncology  --encourage dental evaluation  --will administer IV zometa at next chemotherapy treatment  #Left Eye Vision Changes --[facility] vs occipital lobe lesion --currently using eye patch over left eye.  --continue to monitor   # Weight Loss --encourage high calorie diet --possible increase appetite and weight from steroid therapy --continue to monitor  No orders of the defined types were placed in this encounter.   All questions were answered. The patient knows to call the clinic with any problems, questions or concerns.  A total of more than 30 minutes were spent on this encounter and over half of that time was spent on counseling and coordination of care as outlined above.   William Ali Department of  H[facility]ology[facility]: [phone]: [phone]: [email or web address]  07/11/2019 3:40 PM

## 2019-07-12 ENCOUNTER — Inpatient Hospital Stay: Payer: PRIVATE HEALTH INSURANCE | Admitting: Hematology and Oncology

## 2019-07-12 ENCOUNTER — Inpatient Hospital Stay: Payer: PRIVATE HEALTH INSURANCE

## 2019-07-12 ENCOUNTER — Other Ambulatory Visit: Payer: Self-pay

## 2019-07-12 ENCOUNTER — Telehealth: Payer: Self-pay | Admitting: *Deleted

## 2019-07-12 NOTE — Telephone Encounter (Signed)
Pt and wife arrived 30 minutes past his scheduled time due to misunderstanding in schedule.  Pt made aware that we could do his treatment at 11:30am today but due to his ongoing back pain he preferred to come back tomorrow morning @ 9am for labs/flush/infusion with Dr. Lorenso Courier seeing him in infusion . Delle Reining, Warehouse manager made aware.  Pt and wife very understanding of change in schedule.

## 2019-07-13 ENCOUNTER — Inpatient Hospital Stay: Payer: PRIVATE HEALTH INSURANCE

## 2019-07-13 ENCOUNTER — Encounter: Payer: Self-pay | Admitting: Hematology and Oncology

## 2019-07-13 ENCOUNTER — Inpatient Hospital Stay (HOSPITAL_BASED_OUTPATIENT_CLINIC_OR_DEPARTMENT_OTHER): Payer: PRIVATE HEALTH INSURANCE | Admitting: Hematology and Oncology

## 2019-07-13 ENCOUNTER — Other Ambulatory Visit: Payer: Self-pay

## 2019-07-13 ENCOUNTER — Inpatient Hospital Stay: Payer: Medicaid Other | Admitting: Critical Care Medicine

## 2019-07-13 VITALS — BP 138/80 | HR 97 | Temp 98.1°F | Resp 20 | Wt 206.4 lb

## 2019-07-13 DIAGNOSIS — C7949 Secondary malignant neoplasm of other parts of nervous system: Secondary | ICD-10-CM

## 2019-07-13 DIAGNOSIS — C7931 Secondary malignant neoplasm of brain: Secondary | ICD-10-CM

## 2019-07-13 DIAGNOSIS — C3491 Malignant neoplasm of unspecified part of right bronchus or lung: Secondary | ICD-10-CM

## 2019-07-13 DIAGNOSIS — Z5111 Encounter for antineoplastic chemotherapy: Secondary | ICD-10-CM | POA: Diagnosis not present

## 2019-07-13 DIAGNOSIS — I2694 Multiple subsegmental pulmonary emboli without acute cor pulmonale: Secondary | ICD-10-CM

## 2019-07-13 LAB — CMP (CANCER CENTER ONLY)
ALT: 305 U/L (ref 0–44)
AST: 80 U/L — ABNORMAL HIGH (ref 15–41)
Albumin: 3 g/dL — ABNORMAL LOW (ref 3.5–5.0)
Alkaline Phosphatase: 659 U/L — ABNORMAL HIGH (ref 38–126)
Anion gap: 13 (ref 5–15)
BUN: 19 mg/dL (ref 6–20)
CO2: 24 mmol/L (ref 22–32)
Calcium: 8.9 mg/dL (ref 8.9–10.3)
Chloride: 105 mmol/L (ref 98–111)
Creatinine: 1.18 mg/dL (ref 0.61–1.24)
GFR, Est AFR Am: >60 mL/min (ref >60)
GFR, Estimated: >60 mL/min (ref >60)
Glucose, Bld: 153 mg/dL — ABNORMAL HIGH (ref 70–99)
Potassium: 3.5 mmol/L (ref 3.5–5.1)
Sodium: 142 mmol/L (ref 135–145)
Total Bilirubin: 0.5 mg/dL (ref 0.3–1.2)
Total Protein: 6.5 g/dL (ref 6.5–8.1)

## 2019-07-13 LAB — CBC WITH DIFFERENTIAL (CANCER CENTER ONLY)
Abs Immature Granulocytes: 0.24 10*3/uL — ABNORMAL HIGH (ref 0.00–0.07)
Basophils Absolute: 0 10*3/uL (ref 0.0–0.1)
Basophils Relative: 1 %
Eosinophils Absolute: 0 10*3/uL (ref 0.0–0.5)
Eosinophils Relative: 1 %
HCT: 30.1 % — ABNORMAL LOW (ref 39.0–52.0)
Hemoglobin: 9.4 g/dL — ABNORMAL LOW (ref 13.0–17.0)
Immature Granulocytes: 7 %
Lymphocytes Relative: 6 %
Lymphs Abs: 0.2 10*3/uL — ABNORMAL LOW (ref 0.7–4.0)
MCH: 27.4 pg (ref 26.0–34.0)
MCHC: 31.2 g/dL (ref 30.0–36.0)
MCV: 87.8 fL (ref 80.0–100.0)
Monocytes Absolute: 0.5 10*3/uL (ref 0.1–1.0)
Monocytes Relative: 15 %
Neutro Abs: 2.5 10*3/uL (ref 1.7–7.7)
Neutrophils Relative %: 70 %
Platelet Count: 109 10*3/uL — ABNORMAL LOW (ref 150–400)
RBC: 3.43 MIL/uL — ABNORMAL LOW (ref 4.22–5.81)
RDW: 18.9 % — ABNORMAL HIGH (ref 11.5–15.5)
WBC Count: 3.5 10*3/uL — ABNORMAL LOW (ref 4.0–10.5)
nRBC: 3.4 % — ABNORMAL HIGH (ref 0.0–0.2)

## 2019-07-13 LAB — MAGNESIUM: Magnesium: 1.6 mg/dL — ABNORMAL LOW (ref 1.7–2.4)

## 2019-07-13 LAB — TSH: TSH: 1.577 u[IU]/mL (ref 0.320–4.118)

## 2019-07-13 MED ORDER — PALONOSETRON HCL INJECTION 0.25 MG/5ML
INTRAVENOUS | Status: AC
  Filled 2019-07-13: qty 5

## 2019-07-13 MED ORDER — SODIUM CHLORIDE 0.9 % IV SOLN
150.0000 mg | Freq: Once | INTRAVENOUS | Status: AC
  Administered 2019-07-13: 150 mg via INTRAVENOUS
  Filled 2019-07-13: qty 150

## 2019-07-13 MED ORDER — HYDROMORPHONE HCL 1 MG/ML IJ SOLN
INTRAMUSCULAR | Status: AC
  Filled 2019-07-13: qty 1

## 2019-07-13 MED ORDER — ZOLEDRONIC ACID 4 MG/100ML IV SOLN
INTRAVENOUS | Status: AC
  Filled 2019-07-13: qty 100

## 2019-07-13 MED ORDER — PALONOSETRON HCL INJECTION 0.25 MG/5ML
0.2500 mg | Freq: Once | INTRAVENOUS | Status: AC
  Administered 2019-07-13: 0.25 mg via INTRAVENOUS

## 2019-07-13 MED ORDER — SODIUM CHLORIDE 0.9% FLUSH
10.0000 mL | INTRAVENOUS | Status: DC | PRN
  Administered 2019-07-13: 10 mL
  Filled 2019-07-13: qty 10

## 2019-07-13 MED ORDER — HEPARIN SOD (PORK) LOCK FLUSH 100 UNIT/ML IV SOLN
500.0000 [IU] | Freq: Once | INTRAVENOUS | Status: AC | PRN
  Administered 2019-07-13: 500 [IU]
  Filled 2019-07-13: qty 5

## 2019-07-13 MED ORDER — SODIUM CHLORIDE 0.9 % IV SOLN
Freq: Once | INTRAVENOUS | Status: AC
  Filled 2019-07-13: qty 250

## 2019-07-13 MED ORDER — HYDROMORPHONE HCL 1 MG/ML IJ SOLN
1.0000 mg | Freq: Once | INTRAMUSCULAR | Status: AC
  Administered 2019-07-13: 1 mg via INTRAVENOUS

## 2019-07-13 MED ORDER — SODIUM CHLORIDE 0.9 % IV SOLN
10.0000 mg | Freq: Once | INTRAVENOUS | Status: AC
  Administered 2019-07-13: 10 mg via INTRAVENOUS
  Filled 2019-07-13: qty 10

## 2019-07-13 MED ORDER — HYDROMORPHONE HCL 1 MG/ML IJ SOLN: 1.0000 mg | Freq: Once | INTRAMUSCULAR | Status: DC

## 2019-07-13 MED ORDER — SODIUM CHLORIDE 0.9 % IV SOLN
200.0000 mg | Freq: Once | INTRAVENOUS | Status: AC
  Administered 2019-07-13: 200 mg via INTRAVENOUS
  Filled 2019-07-13: qty 8

## 2019-07-13 MED ORDER — ZOLEDRONIC ACID 4 MG/100ML IV SOLN
4.0000 mg | Freq: Once | INTRAVENOUS | Status: AC
  Administered 2019-07-13: 4 mg via INTRAVENOUS

## 2019-07-13 MED ORDER — SODIUM CHLORIDE 0.9% FLUSH
10.0000 mL | Freq: Once | INTRAVENOUS | Status: AC | PRN
  Administered 2019-07-13: 10 mL
  Filled 2019-07-13: qty 10

## 2019-07-13 MED ORDER — SODIUM CHLORIDE 0.9 % IV SOLN
500.0000 mg/m2 | Freq: Once | INTRAVENOUS | Status: AC
  Administered 2019-07-13: 1100 mg via INTRAVENOUS
  Filled 2019-07-13: qty 40

## 2019-07-13 MED ORDER — SODIUM CHLORIDE 0.9 % IV SOLN
705.0000 mg | Freq: Once | INTRAVENOUS | Status: AC
  Administered 2019-07-13: 710 mg via INTRAVENOUS
  Filled 2019-07-13: qty 71

## 2019-07-13 NOTE — Progress Notes (Signed)
Per MD Ok to treat with LFTs

## 2019-07-13 NOTE — Progress Notes (Signed)
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 Short term f/u of this lesion was recommended. CXR on 05/01/2018 performed with recommended f/u imaging. 2) 05/10/2019: presented to the ED with headache and left pelvis pain. Patient underwent CT C/A/P and MRI brain. Brain MRI showed numerous enhancing parenchymal intracranial lesions likely reflecting metastases. CT scan showed cavitary lesion in the right lung apex with spiculated margins. Additional sites of disease include lymph nodes of the chest and numerous liver mets. In the bones there were multiple lytic appearing lesions throughout the thoracic and lumbar spine as well as portions of the bony pelvis. Referred to Oncology. 3) 05/16/2019: US biopsy of the liver performed. Findings consistent with a poorly differentiated adenocarcinoma.  4) 05/19/2019: establish care with Dr. Lorenso Courier.  5) 05/30/2019: CT simulation. Received Radiation to brain/spine.  5) 06/23/2019: Received Cycle 1 Day 1 of Carbo/Pemetrexed while inpatient. Pembrolizumab not able to be administered in inpatient setting. 6) 07/13/2019:Cycle 2 Day 2 of Carboplatin/Pemetrexed/Pembrolizumab    Interval History:  William Ali 34 y.o. male with medical history significant for metastatic adenocarcinoma of the lung who presents for a follow up visit. The patient's last visit was on 07/05/2019 at which time he was evaluated as a post hospital/post cycle 1 clinic visit. In the interim since the last visit he has had no ED visits or symptom management visits.   On exam today William Ali notes that we are getting a better hold of his pain.  He notes yesterday was quite a good day and he was able to go  out and furniture shop and eat a meal.  He notes that his pain is the worst in the abdomen when he takes a deep breath.  He also notes that his pain is mostly worst in the a.m. though he often does not take his medication until slightly later in the day.  He notes that he seldom does cough but when he does it is quite painful.  He reports on the first day he was on the fentanyl patch it is quite potent, but around the second day he finds a good balance with the pain control.    He also reports that he tolerated the initial treatment of chemotherapy quite well with only some mild nausea and diarrhea.  He is eager to proceed forward with treatment today.  In terms of his anticoagulation therapy he notes he has had no issues with bleeding, bruising, or dark stools.  He reports that he has not been having constipation due to his opioid medications but does want medication on standby in case this does loosen his bowels.  He currently denies having any issues with fevers, chills, sweats, nausea, vomiting or diarrhea.  A full 10 point ROS is listed below.  MEDICAL HISTORY:  Past Medical History:  Diagnosis Date  . Back pain   . Cancer ([place])   . Hypertension     SURGICAL HISTORY: Past Surgical History:  Procedure Laterality Date  . IR IMAGING GUIDED PORT INSERTION  06/12/2019  . KNEE SURGERY      SOCIAL HISTORY: Social History   Socioeconomic History  . Marital status: Married    Spouse name: Not on file  . Number of children: Not on file  .  Years of education: Not on file  . Highest education level: Not on file  Occupational History  . Not on file  Tobacco Use  . Smoking status: Never Smoker  . Smokeless tobacco: Never Used  Vaping Use  . Vaping Use: Never used  Substance and Sexual Activity  . Alcohol use: No  . Drug use: No  . Sexual activity: Yes  Other Topics Concern  . Not on file  Social History Narrative  . Not on file   Social Determinants of Health   Financial  Resource Strain:   . Difficulty of Paying Living Expenses:   Food Insecurity:   . Worried About Charity fundraiser in the Last Year:   . Arboriculturist in the Last Year:   Transportation Needs:   . Film/video editor (Medical):   Marland Kitchen Lack of Transportation (Non-Medical):   Physical Activity:   . Days of Exercise per Week:   . Minutes of Exercise per Session:   Stress:   . Feeling of Stress :   Social Connections:   . Frequency of Communication with Friends and Family:   . Frequency of Social Gatherings with Friends and Family:   . Attends Religious Services:   . Active Member of Clubs or Organizations:   . Attends Archivist Meetings:   Marland Kitchen Marital Status:   Intimate Partner Violence:   . Fear of Current or Ex-Partner:   . Emotionally Abused:   Marland Kitchen Physically Abused:   . Sexually Abused:     FAMILY HISTORY: Family History  Problem Relation Age of Onset  . Hypertension Mother   . Diabetes Father     ALLERGIES:  is allergic to onion, other, peanut oil, and peanut-containing drug products.  MEDICATIONS:  Current Outpatient Medications  Medication Sig Dispense Refill  . albuterol (VENTOLIN HFA) 108 (90 Base) MCG/ACT inhaler TAKE 2 PUFFS BY MOUTH EVERY 6 HOURS AS NEEDED FOR WHEEZE OR SHORTNESS OF BREATH 6.7 g 1  . alum & mag hydroxide-simeth (MAALOX MULTI SYMPTOM MAX ST) 400-400-40 MG/5ML suspension Take 10 mLs by mouth every 6 (six) hours as needed for indigestion. 355 mL 2  . amphetamine-dextroamphetamine (ADDERALL) 10 MG tablet Take 10 mg by mouth every morning, may take 35m additional dose daily as needed for fatigue 60 tablet 0  . celecoxib (CELEBREX) 200 MG capsule Take 1 capsule (200 mg total) by mouth daily. 30 capsule 0  . dexamethasone (DECADRON) 4 MG tablet Take 1 tablet (4 mg total) by mouth 2 (two) times daily with a meal. Starting on 5/11, and drop dose to once daily on 5/14 60 tablet 0  . DULoxetine (CYMBALTA) 30 MG capsule Take 1 capsule (30 mg total)  by mouth daily. 30 capsule 0  . fentaNYL (DURAGESIC) 100 MCG/HR Place 1 patch onto the skin every 3 (three) days. 10 patch 0  . folic acid (FOLVITE) 1 MG tablet Take 1 tablet (1 mg total) by mouth daily. 60 tablet 3  . HYDROmorphone (DILAUDID) 8 MG tablet Take 1/2- 1 tablet every 4 hours as needed for pain 90 tablet 0  . lidocaine-prilocaine (EMLA) cream Apply 1 application topically as needed. 30 g 2  . metoprolol succinate (TOPROL XL) 50 MG 24 hr tablet Take 1 tablet (50 mg total) by mouth daily. Take with or immediately following a meal. 30 tablet 11  . metoprolol tartrate (LOPRESSOR) 25 MG tablet Take 1 tablet (25 mg total) by mouth 2 (two) times daily. 60 tablet 0  .  nystatin (MYCOSTATIN) 100000 UNIT/ML suspension Take 5 mLs (500,000 Units total) by mouth 4 (four) times daily. 60 mL 0  . ondansetron (ZOFRAN) 4 MG tablet Take 1 tablet (4 mg total) by mouth every 8 (eight) hours as needed for nausea or vomiting. 60 tablet 0  . pantoprazole (PROTONIX) 40 MG tablet Take 1 tablet (40 mg total) by mouth 2 (two) times daily. 60 tablet 0  . polyethylene glycol (MIRALAX / GLYCOLAX) 17 g packet Take 17 g by mouth daily. 14 each 0  . rivaroxaban (XARELTO) 20 MG TABS tablet Take 1 tablet (20 mg total) by mouth daily with supper. 30 tablet 1  . senna-docusate (SENOKOT-S) 8.6-50 MG tablet Take 2 tablets by mouth 2 (two) times daily. 120 tablet 0  . zolpidem (AMBIEN) 10 MG tablet Take 1 tablet (10 mg total) by mouth at bedtime as needed for up to 15 days for sleep. 15 tablet 0   No current facility-administered medications for this visit.   Facility-Administered Medications Ordered in Other Visits  Medication Dose Route Frequency Provider Last Rate Last Admin  . HYDROmorphone (DILAUDID) injection 1 mg  1 mg Intravenous Once Jeanie Sewer T IV, MD      . sodium chloride flush (NS) 0.9 % injection 10 mL  10 mL Intracatheter PRN Jaci Standard, MD   10 mL at 07/13/19 1352    REVIEW OF SYSTEMS:    Constitutional: ( - ) fevers, ( - )  chills , ( - ) night sweats Eyes: ( - ) blurriness of vision, ( - ) double vision, ( - ) watery eyes Ears, nose, mouth, throat, and face: ( - ) mucositis, ( - ) sore throat Respiratory: ( - ) cough, ( - ) dyspnea, ( - ) wheezes Cardiovascular: ( - ) palpitation, ( - ) chest discomfort, ( - ) lower extremity swelling Gastrointestinal:  ( - ) nausea, ( - ) heartburn, ( - ) change in bowel habits Skin: ( - ) abnormal skin rashes Lymphatics: ( - ) new lymphadenopathy, ( - ) easy bruising Neurological: ( - ) numbness, ( - ) tingling, ( - ) new weaknesses Behavioral/Psych: ( - ) mood change, ( - ) new changes  All other systems were reviewed with the patient and are negative.  PHYSICAL EXAMINATION: ECOG PERFORMANCE STATUS: 2 - Symptomatic, <50% confined to bed  There were no vitals filed for this visit. There were no vitals filed for this visit.  GENERAL: well appearing young African American male in NAD  SKIN: skin color, texture, turgor are normal, no rashes or significant lesions EYES: conjunctiva are pink and non-injected, sclera clear LUNGS: clear to auscultation and percussion with normal breathing effort HEART: regular rate & rhythm and no murmurs and no lower extremity edema Musculoskeletal: no cyanosis of digits and no clubbing  PSYCH: alert & oriented x 3, fluent speech NEURO: no focal motor/sensory deficits  LABORATORY DATA:  I have reviewed the data as listed CBC Latest Ref Rng & Units 07/13/2019 07/05/2019 06/28/2019  WBC 4.0 - 10.5 K/uL 3.5(L) 6.1 5.3  Hemoglobin 13.0 - 17.0 g/dL 7.6(Y) 10.6(L) 9.4(L)  Hematocrit 39 - 52 % 30.1(L) 33.6(L) 29.8(L)  Platelets 150 - 400 K/uL 109(L) 73(L) 118(L)    CMP Latest Ref Rng & Units 07/13/2019 07/05/2019 06/28/2019  Glucose 70 - 99 mg/dL 146(I) 415(V) 250(B)  BUN 6 - 20 mg/dL 19 22(G) 84(K)  Creatinine 0.61 - 1.24 mg/dL 9.02 0.80(Y) 5.18  Sodium 135 - 145 mmol/L  142 145 137  Potassium 3.5 - 5.1 mmol/L  3.5 3.8 4.7  Chloride 98 - 111 mmol/L 105 106 102  CO2 22 - 32 mmol/L _0 Calcium 8.9 - 10.3 mg/dL 8.9 9.0 8.3(L)  Total Protein 6.5 - 8.1 g/dL 6.5 6.7 5.8(L)  Total Bilirubin 0.3 - 1.2 mg/dL 0.5 0.4 0.7  Alkaline Phos 38 - 126 U/L 659(H) 422(H) 345(H)  AST 15 - 41 U/L 80(H) 64(H) 61(H)  ALT 0 - 44 U/L 305(HH) 172(H) 166(H)     RADIOGRAPHIC STUDIES: DG Chest 2 View  Result Date: 06/17/2019 CLINICAL DATA:  Chest pain, history of prior PE EXAM: CHEST - 2 VIEW COMPARISON:  06/13/2019 FINDINGS: Cardiac shadow is within normal limits. Left lung is clear. Fullness in the right hilum and suprahilar region is noted with volume loss in the upper lobe consistent with the known mass lesion. Right chest wall port is seen in satisfactory position. IMPRESSION: Increasing fullness in the right hilar and suprahilar region with volume loss in the upper lobe slightly progressed when compared with the prior exam. Electronically Signed   By: Inez Catalina M.D.   On: 06/17/2019 14:58   CT HEAD WO CONTRAST  Result Date: 06/17/2019 CLINICAL DATA:  Known brain metastases. Worsening headache. Patient recently started anticoagulation. EXAM: CT HEAD WITHOUT CONTRAST CT CERVICAL SPINE WITHOUT CONTRAST TECHNIQUE: Multidetector CT imaging of the head and cervical spine was performed following the standard protocol without intravenous contrast. Multiplanar CT image reconstructions of the cervical spine were also generated. COMPARISON:  Jun 04, 2019 CT scan of the brain. Brain MRI May 10, 2019. FINDINGS: CT HEAD FINDINGS Brain: No subdural, epidural, or subarachnoid hemorrhage. Most of the patient's known brain metastases are not visualized on this unenhanced CT scan. There is suggestion of a few metastases, very subtle. No acute cortical ischemia or infarct. Cerebellum, brainstem, and basal cisterns are unremarkable. No midline shift or mass effect identified. Vascular: No hyperdense vessel or unexpected calcification.  Skull: Suspected calvarial metastases seen on MRI are not well appreciated on this study. Sinuses/Orbits: Minimal fluid in the posterior right mastoid air cells without bony erosion. There is opacification of the sphenoid sinuses which is worsened in the interval. 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 CT CERVICAL SPINE FINDINGS Alignment: Normal. Skull base and vertebrae: No fractures identified. Relative lucency in the left lamina and pedicle at C2, extending into the spinous process with periosteal reaction. No other discrete bony lesions in the cervical spine. A lucency in the right mandibular head is well-circumscribed. Soft tissues and spinal canal: No prevertebral fluid or swelling. No visible canal hematoma. Disc levels:  Mild multilevel degenerative disc disease. Upper chest: Only the superior most aspect of the right apical mass is seen on this CT. Several small nodules in the left apex are better assessed on today's CT scan of the chest. Other: No other abnormalities. IMPRESSION: 1. 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 There is suggestion of some of the metastases, very subtle. No midline shift. Basal cisterns are patent. 2. 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 3. Suspected calvarial metastases on the previous MRI are not well seen on this study. 4. No fracture or malalignment in the cervical spine. Lucency in the left C2 lamina and pedicle extending into the spinous process with periosteal reaction suggest metastatic  disease in this region. No other definitive bony metastatic disease in the cervical spine. 5. There is a rounded lucency in the right mandibular head which is nonspecific. 6. Nodularity in the lung apices. See the CT scan of the chest for better evaluation of this region. Electronically Signed   By: Gerome Sam III M.D   On: 06/17/2019 16:37   CT ANGIO CHEST PE W OR WO CONTRAST  Result Date: 06/17/2019 CLINICAL DATA:  Recently diagnosed metastatic lung cancer, worsening shortness of breath, chest pain, dyspnea EXAM: CT ANGIOGRAPHY CHEST WITH CONTRAST TECHNIQUE: Multidetector CT imaging of the chest was performed using the standard protocol during bolus administration of intravenous contrast. Multiplanar CT image reconstructions and MIPs were obtained to evaluate the vascular anatomy. CONTRAST:  OMNIPAQUE IOHEXOL 350 MG/ML SOLN COMPARISON:  06/13/2019 FINDINGS: Cardiovascular: This is a technically adequate evaluation of the pulmonary vasculature. The chronic right lower lobe pulmonary emboli seen previously are again noted and unchanged. There are no acute pulmonary emboli. The right hilar mass results in extrinsic compression of the right upper lobe pulmonary artery, unchanged since prior exam. No pericardial effusion.  The thoracic aorta remains unremarkable. Mediastinum/Nodes: Mediastinal and right hilar adenopathy unchanged since prior exam. Index lymph node in the right paratracheal region image 22 measures 11 mm in short axis, stable since previous study. Right hilar mass measures 2.5 cm reference image 37, previously measuring approximately 2.2 cm. No new adenopathy. The thyroid, trachea, and esophagus are grossly normal. Lungs/Pleura: Thick-walled cavitary right upper lobe mass is again noted, measuring approximately 2.8 x 3.6 cm reference image 23, previously measuring 3.4 x 2.2 cm by my measurement. Findings are consistent with primary malignancy. Progressive wedge-shaped consolidation within the right upper lobe likely reflects pulmonary infarct given the extrinsic compression on the right upper lobe pulmonary artery by the infiltrative right hilar mass. Subcentimeter nodule left upper lobe image 28 and superior segment left lower lobe image 39 unchanged. There are trace bilateral pleural  effusions right greater than left. No pneumothorax. There is narrowing of the right upper lobe bronchus and segmental airways due to the infiltrative right hilar mass described above, similar to previous study. Upper Abdomen: Numerous liver metastases are noted, please refer to CT abdomen report. Musculoskeletal: Mottled appearance of the thoracic spine consistent with bony metastases, stable since prior study. Pathologic T3 fracture is seen with mild retropulsion resulting in at least 50% narrowing of the central canal. There is likely an associated soft tissue component. No other pathologic fractures. Review of the MIP images confirms the above findings. IMPRESSION: 1. 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 No acute pulmonary embolus. 2. Cavitating right upper lobe mass consistent with lung cancer. Minimal increase in size. 3. Marked right hilar and mediastinal adenopathy as above, without significant change. There is extrinsic compression upon the right upper lobe pulmonary artery and narrowing of the right upper lobe bronchus due to the right hilar mass, stable. 4. Progressive right upper lobe pulmonary infarct due to the extrinsic compression on the right upper lobe pulmonary artery. 5. Diffuse bony metastases. Pathologic T3 fracture with resulting central canal narrowing as above. Please refer to CT thoracic spine report. 6. Trace bilateral pleural effusions. 7. Liver metastases.  Please refer to abdominal CT report. Electronically Signed   By: Sharlet Salina M.D.   On: 06/17/2019 16:30   CT CERVICAL SPINE WO CONTRAST  Result Date: 06/17/2019 CLINICAL DATA:  Known brain metastases. Worsening headache. Patient recently started anticoagulation. EXAM: CT HEAD  WITHOUT CONTRAST CT CERVICAL SPINE WITHOUT CONTRAST TECHNIQUE: Multidetector CT imaging of the head and cervical spine was performed following the standard protocol without intravenous contrast. Multiplanar CT image reconstructions of the  cervical spine were also generated. COMPARISON:  Jun 04, 2019 CT scan of the brain. Brain MRI May 10, 2019. FINDINGS: CT HEAD FINDINGS Brain: No subdural, epidural, or subarachnoid hemorrhage. Most of the patient's known brain metastases are not visualized on this unenhanced CT scan. There is suggestion of a few metastases, very subtle. No acute cortical ischemia or infarct. Cerebellum, brainstem, and basal cisterns are unremarkable. No midline shift or mass effect identified. Vascular: No hyperdense vessel or unexpected calcification. Skull: Suspected calvarial metastases seen on MRI are not well appreciated on this study. Sinuses/Orbits: Minimal fluid in the posterior right mastoid air cells without bony erosion. There is opacification of the sphenoid sinuses which is worsened in the interval. 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 CT CERVICAL SPINE FINDINGS Alignment: Normal. Skull base and vertebrae: No fractures identified. Relative lucency in the left lamina and pedicle at C2, extending into the spinous process with periosteal reaction. No other discrete bony lesions in the cervical spine. A lucency in the right mandibular head is well-circumscribed. Soft tissues and spinal canal: No prevertebral fluid or swelling. No visible canal hematoma. Disc levels:  Mild multilevel degenerative disc disease. Upper chest: Only the superior most aspect of the right apical mass is seen on this CT. Several small nodules in the left apex are better assessed on today's CT scan of the chest. Other: No other abnormalities. IMPRESSION: 1. 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 There is suggestion of some of the metastases, very subtle. No midline shift. Basal cisterns are patent. 2. 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 3. Suspected calvarial  metastases on the previous MRI are not well seen on this study. 4. No fracture or malalignment in the cervical spine. Lucency in the left C2 lamina and pedicle extending into the spinous process with periosteal reaction suggest metastatic disease in this region. No other definitive bony metastatic disease in the cervical spine. 5. There is a rounded lucency in the right mandibular head which is nonspecific. 6. Nodularity in the lung apices. See the CT scan of the chest for better evaluation of this region. Electronically Signed   By: Dorise Bullion III M.D   On: 06/17/2019 16:37   MR Cervical Spine Wo Contrast  Result Date: 06/17/2019 CLINICAL DATA:  34 year old male with widely metastatic lung cancer. Back pain. EXAM: MRI CERVICAL SPINE WITHOUT CONTRAST TECHNIQUE: Multiplanar, multisequence MR imaging of the cervical spine was performed. No intravenous contrast was administered. COMPARISON:  CT Thoracic Spine, Lumbar Spine, CT Chest, Abdomen, and Pelvis yesterday. Brain MRI 05/10/2019. FINDINGS: Alignment: Overall straightening of cervical lordosis. Vertebrae: Metastatic disease to bone at virtually all cervical spine levels, and throughout the visible skull base. Only the C1 vertebra seems to be spared at this time. Anterior and posterior element metastases in the cervical spine. No pathologic cervical vertebral fracture, although there is epidural and extraosseous extension of tumor from the left C2 posterior elements (series 13, image 4) with severe involvement of the left C3 neural foramen. No significant cervical spinal stenosis. See thoracic spine findings reported separately. Cord: Spinal cord signal is within normal limits at all visualized levels. No IV  contrast administered. Posterior Fossa, vertebral arteries, paraspinal tissues: Cervicomedullary junction is within normal limits. Abnormal T2 and STIR hyperintensity in the visible right cerebellar hemisphere compatible with known brain metastases. No  posterior fossa mass effect is evident. Preserved major vascular flow voids in the neck. There is inflammation of the C2-C3 level posterior paraspinal muscles greater on the left which is probably related to the C2 extraosseous tumor. Disc levels: Incidental degenerative changes including: C3-C4: Small leftward disc herniation with mild spinal stenosis and mild spinal cord mass effect. C4-C5: Disc bulge and small rightward disc herniation with mild spinal stenosis and mild right hemi cord mass effect. Associated moderate to severe right C5 foraminal stenosis. IMPRESSION: 1. Widespread osseous metastatic disease throughout the cervical spine and the visible skull base. 2. Epidural and extraosseous extension of tumor from the left C2 posterior elements with severe involvement of the left C3 neural foramen, but no malignant spinal stenosis. There is mild degenerative cervical spinal stenosis at C3-C4 and C4-C5. 3. No pathologic cervical vertebral fracture. 4. See Thoracic Spine findings reported separately. Electronically Signed   By: Genevie Ann M.D.   On: 06/17/2019 21:59   MR THORACIC SPINE WO CONTRAST  Result Date: 06/17/2019 CLINICAL DATA:  34 year old male with widely metastatic lung cancer. Back pain. EXAM: MRI THORACIC SPINE WITHOUT CONTRAST TECHNIQUE: Multiplanar, multisequence MR imaging of the thoracic spine was performed. No intravenous contrast was administered. COMPARISON:  Cervical MRI today reported separately. Thoracic spine CT, CT Chest, Abdomen, and Pelvis yesterday. FINDINGS: Limited cervical spine imaging:  Reported separately today. Thoracic spine segmentation:  Normal. Alignment:  Straightening of thoracic kyphosis. Vertebrae: Thoracic vertebral metastases at every level. Subtotal T1 vertebral replacement by tumor. Early extraosseous extension from the spinous process into the paraspinal muscles. T3 vertebral body pathologic compression fracture with 50% central loss of vertebral body height, and  some retropulsed bone/tumor. Additionally, there is evidence of some epidural tumor extension at that level (series 34, image 15) resulting in mild spinal stenosis, borderline to mild cord compression. Moderate left T3 neural foraminal stenosis. Epidural tumor also at the T5 level where the vertebra is completely replaced by tumor. Abundant extraosseous extension of tumor also from the posterior elements into the posterior paraspinal musculature. Mild spinal stenosis and borderline to mild cord compression. T6 near complete vertebral replacement by tumor with ventral and foraminal epidural tumor extension. When superimposed on epidural lipomatosis there is mild spinal stenosis and cord compression (series 34, image 28). Moderate to severe right T6 foraminal involvement. Ventral epidural tumor extension at T7 without cord compression at this time (series 34, image 32). Ventral epidural tumor extension at T9 with mild spinal stenosis and borderline to mild cord compression at this time (image 42). T10 right side posterior element extraosseous extension of tumor into the paraspinal muscles. Cord: No thoracic cord signal abnormality despite the multiple levels of epidural tumor. Paraspinal and other soft tissues: Abnormal right upper lung and mediastinum. Small layering right pleural effusion. Partially visible liver metastases. Disc levels: There are some superimposed thoracic degenerative changes, including: -Mild degenerative thoracic spinal stenosis at T8-T9 related to left eccentric disc and endplate degeneration, mild to moderate posterior element degeneration. -borderline to mild degenerative spinal stenosis at T11-T12 related to right paracentral disc protrusion and mild facet hypertrophy. IMPRESSION: 1. Bone metastases involving every thoracic spine level. Pathologic fracture of T3 with up to 50% central vertebral loss of height, mild retropulsion of bone in tumor. 2. Ventral epidural tumor with up to mild  spinal  stenosis and borderline to mild cord compression at: T3, T5, T6, T7, T9. 3. Moderate or severe neural foraminal involvement by tumor at the left T3, right T6 nerve levels. 4. Superimposed mild degenerative thoracic spinal stenosis at T8-T9 and T11-T12. Electronically Signed   By: Genevie Ann M.D.   On: 06/17/2019 22:09   MR LUMBAR SPINE WO CONTRAST  Result Date: 06/17/2019 CLINICAL DATA:  34 year old male with widely metastatic lung cancer. Back pain. EXAM: MRI LUMBAR SPINE WITHOUT CONTRAST TECHNIQUE: Multiplanar, multisequence MR imaging of the lumbar spine was performed. No intravenous contrast was administered. COMPARISON:  Thoracic spine MRI today. Lumbar CT, CT Chest, Abdomen, and Pelvis yesterday. Lumbar MRI 05/10/2019. FINDINGS: Segmentation: Normal, concordant with the thoracic spine numbering today. Alignment:  Stable lumbar lordosis since April. Vertebrae: Lumbar metastatic disease at all levels. Compared to 05/10/2019 lumbar vertebral tumor shows mild generalized progression. Confluent sacral and medial iliac bone metastatic disease redemonstrated. Mild pathologic fracture of the L2 superior endplate. This has only mildly progressed since April (series 38, image 9). No other lumbar pathologic fracture. And although there is some early lumbar extraosseous tumor extension (right L4 transverse process series 39, image 25), there is no significant lumbar epidural or neural foraminal tumor at this time. However, there is obliteration of the right S1, left S2 and S3 neural foramina by tumor. Conus medullaris and cauda equina: Conus extends to the T12-L1 level. No lower spinal cord or conus signal abnormality. Cauda equina nerve roots appear normal in the absence of contrast. Paraspinal and other soft tissues: Stable visible abdominal viscera. Disc levels: Lumbar epidural lipomatosis. Superimposed lumbar degenerative changes most notable for: -L3-L4 left paracentral disc protrusion superimposed on disc  bulge, posterior element hypertrophy and epidural lipomatosis. Moderate left lateral recess stenosis (left L4 nerve level) and mild spinal stenosis. IMPRESSION: 1. Lumbar metastatic disease at all levels with mild generalized progression since the April MRI. Mild pathologic fracture of the L2 superior endplate. 2. Occasional early extraosseous extension of tumor (right L4 transverse process), but no lumbar epidural or neural foraminal tumor at this time. 3. Bulky sacral tumor re-demonstrated including severe involvement of the right S1, left S2, and left S3 neural foramina. Electronically Signed   By: Genevie Ann M.D.   On: 06/17/2019 22:16   US Abdomen Complete  Result Date: 06/21/2019 CLINICAL DATA:  Postprandial abdominal pain. Metastatic lung cancer. EXAM: ABDOMEN ULTRASOUND COMPLETE COMPARISON:  CT abdomen pelvis 05/10/2019 FINDINGS: Gallbladder: No gallstones or wall thickening visualized. No sonographic Murphy sign noted by sonographer. Common bile duct: Diameter: 2.7 mm Liver: Liver parenchyma demonstrates increased echogenicity diffusely. Multiple liver masses are present compatible metastatic disease as noted on prior CT. Largest lesions in the right lobe measure 3.7 x 2.6 x 1.7 cm, and 3.8 x 3.5 x 3.5 cm. Multiple smaller lesions also present in the liver. Portal vein is patent on color Doppler imaging with normal direction of blood flow towards the liver. IVC: No abnormality visualized. Pancreas: Visualized portion unremarkable. Spleen: Size and appearance within normal limits. Right Kidney: Length: 9.7 cm. Echogenicity within normal limits. No mass or hydronephrosis visualized. Left Kidney: Length: 10.4 cm. Echogenicity within normal limits. No mass or hydronephrosis visualized. Abdominal aorta: No aneurysm visualized. Other findings: Small right pleural effusion IMPRESSION: Negative for gallstones Multiple hepatic metastasis as noted on CT. Fatty infiltration liver is noted. Small right pleural  effusion. Electronically Signed   By: Franchot Gallo M.D.   On: 06/21/2019 09:08   CT ABDOMEN PELVIS W CONTRAST  Result Date: 06/17/2019 CLINICAL DATA:  Metastatic lung cancer, chest pain, lower and mid back pain EXAM: CT ABDOMEN AND PELVIS WITH CONTRAST TECHNIQUE: Multidetector CT imaging of the abdomen and pelvis was performed using the standard protocol following bolus administration of intravenous contrast. CONTRAST:  154m OMNIPAQUE IOHEXOL 350 MG/ML SOLN COMPARISON:  05/10/2019 FINDINGS: Lower chest: Please refer to corresponding chest CT report describing right upper lobe malignancy, right hilar and mediastinal adenopathy, and chronic right lower lobe pulmonary emboli. Hepatobiliary: Innumerable hepatic metastases are again noted, increased in size. Largest index lesion within the caudate measures 4.9 cm image 18, previously measuring 2.8 cm image 47. No biliary dilation.  Gallbladder is unremarkable. Pancreas: Unremarkable. No pancreatic ductal dilatation or surrounding inflammatory changes. Spleen: Normal in size without focal abnormality. Adrenals/Urinary Tract: Adrenal glands are unremarkable. Kidneys are normal, without renal calculi, focal lesion, or hydronephrosis. Bladder is unremarkable. Stomach/Bowel: No bowel obstruction or ileus. No bowel wall thickening or inflammatory changes. Vascular/Lymphatic: No pathologic adenopathy within the abdomen or pelvis. Vascular structures are unremarkable. Reproductive: Prostate is unremarkable. Other: No free fluid or free gas.  No abdominal wall hernia. Musculoskeletal: Progressive diffuse bony metastases are seen throughout the bilateral hips, lumbar spine, sacrum, and bony pelvis. Please refer to dedicated CT lumbar spine describing findings related to the lumbar spine and sacrum. Increased size of the expansile left iliac crest lesion with associated soft tissue component. There are no pathologic fractures. IMPRESSION: 1. Progressive liver metastases as  above. 2. Progressive bony metastases involving the lumbar spine, bony pelvis, and sacrum. Please refer to corresponding CT lumbar spine report for important findings in that region. Electronically Signed   By: MRanda NgoM.D.   On: 06/17/2019 16:46   CT T-SPINE NO CHARGE  Result Date: 06/17/2019 CLINICAL DATA:  Metastatic lung cancer, back pain EXAM: CT THORACIC SPINE WITHOUT CONTRAST TECHNIQUE: Multidetector CT images of the thoracic were obtained using the standard protocol without intravenous contrast. COMPARISON:  06/13/2019 FINDINGS: Alignment: Alignment is anatomic. Vertebrae: Diffuse metastatic disease is seen throughout the entirety of the thoracic spine. There is moderate appearance and destruction of the T3 vertebral body with pathologic fracture centrally with 50% loss of vertebral body height. Paraspinal and other soft tissues: Please refer to corresponding chest CT report describing a cavitating right upper lobe mass, right hilar mass, and diffuse mediastinal adenopathy. Trace bilateral pleural effusions are noted. There are chronic right lower lobe segmental pulmonary emboli. Disc levels: As result of the T3 pathologic fracture and metastatic disease described above, there is approximately 50% narrowing of the central canal at T3. At T8 and T9, there are expansile metastatic lesions resulting in bowing of the posterior margin of the vertebral bodies with estimated 50-75% narrowing of the central canal and likely cord compression. IMPRESSION: 1. Diffuse bony metastases throughout the entirety of the thoracic spine, most pronounced at T3, T8, and T9 with resulting central canal narrowing and likely cord compression. 2. Pathologic fracture of the T3 vertebral body, with 50% loss of height centrally. 3. Please refer to CT chest report describing cavitating right upper lobe mass, right hilar mass, and mediastinal adenopathy. Electronically Signed   By: MRanda NgoM.D.   On: 06/17/2019 16:40    CT L-SPINE NO CHARGE  Result Date: 06/17/2019 CLINICAL DATA:  Low back pain, metastatic lung cancer EXAM: CT LUMBAR SPINE WITHOUT CONTRAST TECHNIQUE: Multidetector CT imaging of the lumbar spine was performed without intravenous contrast administration. Multiplanar CT image reconstructions were also generated. COMPARISON:  06/13/2019  FINDINGS: Segmentation: There are 5 non-rib-bearing lumbar type vertebral bodies. Alignment: Alignment is anatomic. Vertebrae: There is diffuse metastatic disease, with multiple lytic lesions throughout the lumbar spine. These are most pronounced within the left aspect of the L1 vertebral body extending into the left pedicle, anterior aspect of the L2 vertebral body, right L3 transverse process and central aspect L3 vertebral body, right L4 pedicle and transverse process. Additional lytic lesions are seen within the bilateral iliac bones and sacrum. Bony destruction and soft tissue mass obliterate the left S2/S3 and S3/S4 neural foramen. I do not see any pathologic fracture within the lumbar spine. Paraspinal and other soft tissues: Soft tissue mass results in obliteration of the left S2/S3 and S3/S4 neural foramen. Likely soft tissue component arising from the L1 metastatic lesion with slight enlargement of the left psoas muscle. Disc levels: There is multilevel lumbar spondylosis. Mild central canal stenosis is seen at L1/L2, L2/L3, and L3/L4. At L3/L4 there is left predominant lateral recess and neural foraminal encroachment as well. IMPRESSION: 1. Diffuse bony metastatic disease throughout the lumbar spine, sacrum, and bilateral iliac bones as above. 2. There are no pathologic fractures. Soft tissue mass and bony destruction obliterate the left S2/S3 and S3/S4 neural foramen. 3. Multilevel lumbar spondylosis most pronounced at L3/L4 with left predominant neural foraminal and lateral recess encroachment. Electronically Signed   By: Randa Ngo M.D.   On: 06/17/2019 16:35     ASSESSMENT & PLAN William Ali 34 y.o. male with medical history significant for metastatic adenocarcinoma of the lung who presents for a follow up visit.  The patient has successfully completed Cycle 1 of carboplatin and pemetrexed, started on 06/23/2019.  Unfortunately this had to be administered in the inpatient setting because the patient was admitted for pain control.  As we were unsure when he would be discharged we decided to move forward with the treatment at that time.  Pembrolizumab was not able to be administered in house due to logistical and insurance issues.  The plan is currently for him to receive pembrolizumab during Cycle 2 which starts today.  We are continuing to get better control of his pain with fentanyl patches as well as Dilaudid p.o. with some additional medications including Celebrex and Cymbalta.  Pain control has been improving.  In terms of appetite the patient has been eating well and is currently up 6 lbs..  He is off steroid therapy as he believes this was causing his abdominal pain to worsen.  Diagnostically the patient had Foundation One PD-L1 testing set for his tumor and unfortunately no targetable mutations were noted.  Given his young age and the aggressive nature of this cancer I would recommend we get Guardant 360 in order to try to detect any possible targetable mutations.  This was discussed with the lung expert onsite who notes that one test or the other can miss mutations in up to 15% of patients.  We will plan to have this drawn during his next clinic visit.  Previously I made it clear to Mr. Opara that all treatments for administering are palliative in nature and designed to shrink the tumor and reduce symptoms.  The goal is to extend life and provide him some quality of life.  Once again we reiterated that there is no cure for his disease and that all steps moving forward should be made with this in mind.  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 Pathology could not definitively say, but he had a prior nodule on CXR and mass in lung appears to be the primary lesion --tissue sent for Foundation One and PD-L1 immunohistochemistry with no targetable mutations noted. Will send for Guardant 360 with blood collection today --started Carbo/Pem/Pem (regimen detailed above) Cycle 1 Day 1 on 06/23/2019. Cycle 1 administered inpatient, pembrolizumab was not able to be added at that time. Plan for Cycle 2 to start today with pembroliuzmab.  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 and radiation oncology are following, Appreciate assistance in management of brain mets.  --pain control as noted below.  #Pain Control, improving --pain is predominately in the back, head, and abdomen. Of note, headaches have been improving  --patient has d/c steroid therapy as it made abdominal pain worse --currently on fentanyl patches 149mg q 72H --Taking dilaudid PO [id number] PRN --additionally is taking cymablata 3032mO and celebrex 200m67mily  --senna docusate/PRN miralax for opioid induced constipation  #Osseus Lesions/Hip Pain --oxycodone as above --recommend evaluation by radiation oncology  --encourage dental evaluation  --continue IV zometa therapy for bone protection. Started 07/13/2019, to continue q 3 months.   #Left Eye Vision Changes --[facility] of globe vs occipital lobe lesion --currently using eye patch over left eye.  --continue to monitor   # Weight Loss --encourage high  calorie diet --possible increase appetite and weight from steroid therapy --continue to monitor  No orders of the defined types were placed in this encounter.  All questions were answered. The patient knows to call the clinic with any problems, questions or concerns.  A total of more than 30 minutes were spent on this encounter and over half of that time was spent on counseling and coordination of care as outlined above.   JohnLedell Peoples Department of Hematology/Oncology [facility]: [phone]: [phone]: [email or web address]  07/13/2019 6:09 PM

## 2019-07-13 NOTE — Progress Notes (Deleted)
Subjective:    Patient ID: William Ali, male    DOB: [birth year], 34 y.o.   MRN: [id number]  Admit date: 06/17/2019 Discharge date: 06/28/2019  Admitted From: Home Disposition: Home  Recommendations for Outpatient Follow-up:  1. Follow up with PCP in 1-2 weeks 2. Follow-up with palliative care in 2 weeks 3. Please obtain BMP/CBC in one week 4. Please follow up with your PCP on the following pending results: Unresulted Labs (From admission, onward)    Start     Ordered   06/26/19 1732   Gastrointestinal Panel by PCR , Stool  (Gastrointestinal Panel by PCR, Stool                                                                                                                                                     *Does Not include CLOSTRIDIUM DIFFICILE testing.**If CDIFF testing is needed, select the C Difficile Quick Screen w PCR reflex order below)  Once,   R    06/26/19 1731   06/24/19 0500   CBC with Differential/Platelet  Daily,   R   Question:  Specimen collection method  Answer:  IV Team=IV Team collect  06/23/19 1512   Unscheduled   CBC with Differential ([place])  STAT    06/23/19 0812   Unscheduled   CMP ([place] only)  STAT    06/23/19 0812   Unscheduled   T4  STAT    06/23/19 0812   Unscheduled   TSH  STAT    06/23/19 0812        Home Health: Yes Equipment/Devices: Commode  Discharge Condition: Stable CODE STATUS: Full code Diet recommendation: Cardiac   subjective: Seen and examined.  Pain controlled.  Feels much better.  Wants to go home as today is his birthday.  Brief/Interim Summary: William J Carsonis a 34 y.o.maleveryunfortunate young manwith medical history significant ofmetastatic lung cancer who presented to the emergency department with complaints of headache,chest pain,back pain, abdominal pain. He was bothered by pain in upper back and chest. He was also having abdominal discomfort and severe headache.  There is no report of fever, chills, nausea, vomiting, dysuria or shortness of breath.He has been currently treated with radiation therapy and is starting on chemotherapy soon. He was diagnosed with DVT on 06/04/2019 and was diagnosed with PE on his last admission.He was treated with heparin drip for his PE and was discharged on Xarelto. He also recently had a Chemo-Port placement on 06/12/2019. On presentation, he was in sinus tachycardia, quite hypertensive most likely secondary to pain.X-rays show continued extensive, expanding tumor burden. 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 Oncology and radiation oncology consulted. [facility]T consulted for cancer related pain management.  Patient received his first dose of chemo (pemetrexed and carboplatin) on 06/23/2019 without any issues and next dose will be given in 3 weeks.  He also developed oral candidiasis which was treated with antifungal.  Rest of the medical issues remained stable.  Finally his pain is under control managed by palliative care.  Today is his birthday and he wants to go home.  Dr. Hilma Favors from [facility] has sent all the medications to his pharmacy.  He will receive them before leaving today.   Discharge Diagnoses:  Principal Problem:   Metastatic primary lung cancer ([place]) Active Problems:   Goals of care, counseling/discussion   Pulmonary embolism ([place])   Metastasis of neoplasm to spinal canal [facility])   Palliative care encounter        Review of Systems     Objective:   Physical Exam        Assessment & Plan:

## 2019-07-13 NOTE — Patient Instructions (Addendum)
[facility] Discharge Instructions for Patients Receiving Chemotherapy  Today you received the following chemotherapy agents Pembrolizumab, Carboplatin, and Alimta.  To help prevent nausea and vomiting after your treatment, we encourage you to take your nausea medication as directed.  If you develop nausea and vomiting that is not controlled by your nausea medication, call the clinic.   BELOW ARE SYMPTOMS THAT SHOULD BE REPORTED IMMEDIATELY:  *FEVER GREATER THAN 100.5 F  *CHILLS WITH OR WITHOUT FEVER  NAUSEA AND VOMITING THAT IS NOT CONTROLLED WITH YOUR NAUSEA MEDICATION  *UNUSUAL SHORTNESS OF BREATH  *UNUSUAL BRUISING OR BLEEDING  TENDERNESS IN MOUTH AND THROAT WITH OR WITHOUT PRESENCE OF ULCERS  *URINARY PROBLEMS  *BOWEL PROBLEMS  UNUSUAL RASH Items with * indicate a potential emergency and should be followed up as soon as possible.  Feel free to call the clinic should you have any questions or concerns. The clinic phone number is [phone].  Please show the [facility] at check-in to the Emergency Department and triage nurse.  Pembrolizumab injection What is this medicine? PEMBROLIZUMAB (pem broe liz ue mab) is a monoclonal antibody. It is used to treat certain types of cancer. This medicine may be used for other purposes; ask your health care provider or pharmacist if you have questions. COMMON BRAND NAME(S): Keytruda What should I tell my health care provider before I take this medicine? They need to know if you have any of these conditions:  diabetes  immune system problems  inflammatory bowel disease  liver disease  lung or breathing disease  lupus  received or scheduled to receive an organ transplant or a stem-cell transplant that uses donor stem cells  an unusual or allergic reaction to pembrolizumab, other medicines, foods, dyes, or preservatives  pregnant or trying to get pregnant  breast-feeding How should I use this  medicine? This medicine is for infusion into a vein. It is given by a health care professional in a hospital or clinic setting. A special MedGuide will be given to you before each treatment. Be sure to read this information carefully each time. Talk to your pediatrician regarding the use of this medicine in children. While this drug may be prescribed for children as young as 6 months for selected conditions, precautions do apply. Overdosage: If you think you have taken too much of this medicine contact a poison control center or emergency room at once. NOTE: This medicine is only for you. Do not share this medicine with others. What if I miss a dose? It is important not to miss your dose. Call your doctor or health care professional if you are unable to keep an appointment. What may interact with this medicine? Interactions have not been studied. Give your health care provider a list of all the medicines, herbs, non-prescription drugs, or dietary supplements you use. Also tell them if you smoke, drink alcohol, or use illegal drugs. Some items may interact with your medicine. This list may not describe all possible interactions. Give your health care provider a list of all the medicines, herbs, non-prescription drugs, or dietary supplements you use. Also tell them if you smoke, drink alcohol, or use illegal drugs. Some items may interact with your medicine. What should I watch for while using this medicine? Your condition will be monitored carefully while you are receiving this medicine. You may need blood work done while you are taking this medicine. Do not become pregnant while taking this medicine or for 4 months after stopping it.  Women should inform their doctor if they wish to become pregnant or think they might be pregnant. There is a potential for serious side effects to an unborn child. Talk to your health care professional or pharmacist for more information. Do not breast-feed an infant while  taking this medicine or for 4 months after the last dose. What side effects may I notice from receiving this medicine? Side effects that you should report to your doctor or health care professional as soon as possible:  allergic reactions like skin rash, itching or hives, swelling of the face, lips, or tongue  bloody or black, tarry  breathing problems  changes in vision  chest pain  chills  confusion  constipation  cough  diarrhea  dizziness or feeling faint or lightheaded  fast or irregular heartbeat  fever  flushing  joint pain  low blood counts - this medicine may decrease the number of white blood cells, red blood cells and platelets. You may be at increased risk for infections and bleeding.  muscle pain  muscle weakness  pain, tingling, numbness in the hands or feet  persistent headache  redness, blistering, peeling or loosening of the skin, including inside the mouth  signs and symptoms of high blood sugar such as dizziness; dry mouth; dry skin; fruity breath; nausea; stomach pain; increased hunger or thirst; increased urination  signs and symptoms of kidney injury like trouble passing urine or change in the amount of urine  signs and symptoms of liver injury like dark urine, light-colored stools, loss of appetite, nausea, right upper belly pain, yellowing of the eyes or skin  sweating  swollen lymph nodes  weight loss Side effects that usually do not require medical attention (report to your doctor or health care professional if they continue or are bothersome):  decreased appetite  hair loss  muscle pain  tiredness This list may not describe all possible side effects. Call your doctor for medical advice about side effects. You may report side effects to FDA at 1-800-FDA-1088. Where should I keep my medicine? This drug is given in a hospital or clinic and will not be stored at home. NOTE: This sheet is a summary. It may not cover all  possible information. If you have questions about this medicine, talk to your doctor, pharmacist, or health care provider.  2020 [employer]/Gold Standard (2018-11-18 18:07:58)  Zoledronic Acid injection (Hypercalcemia, Oncology) What is this medicine? ZOLEDRONIC ACID (ZOE le dron ik AS id) lowers the amount of calcium loss from bone. It is used to treat too much calcium in your blood from cancer. It is also used to prevent complications of cancer that has spread to the bone. This medicine may be used for other purposes; ask your health care provider or pharmacist if you have questions. COMMON BRAND NAME(S): Zometa What should I tell my health care provider before I take this medicine? They need to know if you have any of these conditions:  aspirin-sensitive asthma  cancer, especially if you are receiving medicines used to treat cancer  dental disease or wear dentures  infection  kidney disease  receiving corticosteroids like dexamethasone or prednisone  an unusual or allergic reaction to zoledronic acid, other medicines, foods, dyes, or preservatives  pregnant or trying to get pregnant  breast-feeding How should I use this medicine? This medicine is for infusion into a vein. It is given by a health care professional in a hospital or clinic setting. Talk to your pediatrician regarding the use of this medicine  in children. Special care may be needed. Overdosage: If you think you have taken too much of this medicine contact a poison control center or emergency room at once. NOTE: This medicine is only for you. Do not share this medicine with others. What if I miss a dose? It is important not to miss your dose. Call your doctor or health care professional if you are unable to keep an appointment. What may interact with this medicine?  certain antibiotics given by injection  NSAIDs, medicines for pain and inflammation, like ibuprofen or naproxen  some diuretics like bumetanide,  furosemide  teriparatide  thalidomide This list may not describe all possible interactions. Give your health care provider a list of all the medicines, herbs, non-prescription drugs, or dietary supplements you use. Also tell them if you smoke, drink alcohol, or use illegal drugs. Some items may interact with your medicine. What should I watch for while using this medicine? Visit your doctor or health care professional for regular checkups. It may be some time before you see the benefit from this medicine. Do not stop taking your medicine unless your doctor tells you to. Your doctor may order blood tests or other tests to see how you are doing. Women should inform their doctor if they wish to become pregnant or think they might be pregnant. There is a potential for serious side effects to an unborn child. Talk to your health care professional or pharmacist for more information. You should make sure that you get enough calcium and vitamin D while you are taking this medicine. Discuss the foods you eat and the vitamins you take with your health care professional. Some people who take this medicine have severe bone, joint, and/or muscle pain. This medicine may also increase your risk for jaw problems or a broken thigh bone. Tell your doctor right away if you have severe pain in your jaw, bones, joints, or muscles. Tell your doctor if you have any pain that does not go away or that gets worse. Tell your dentist and dental surgeon that you are taking this medicine. You should not have major dental surgery while on this medicine. See your dentist to have a dental exam and fix any dental problems before starting this medicine. Take good care of your teeth while on this medicine. Make sure you see your dentist for regular follow-up appointments. What side effects may I notice from receiving this medicine? Side effects that you should report to your doctor or health care professional as soon as  possible:  allergic reactions like skin rash, itching or hives, swelling of the face, lips, or tongue  anxiety, confusion, or depression  breathing problems  changes in vision  eye pain  feeling faint or lightheaded, falls  jaw pain, especially after dental work  mouth sores  muscle cramps, stiffness, or weakness  redness, blistering, peeling or loosening of the skin, including inside the mouth  trouble passing urine or change in the amount of urine Side effects that usually do not require medical attention (report to your doctor or health care professional if they continue or are bothersome):  bone, joint, or muscle pain  constipation  diarrhea  fever  hair loss  irritation at site where injected  loss of appetite  nausea, vomiting  stomach upset  trouble sleeping  trouble swallowing  weak or tired This list may not describe all possible side effects. Call your doctor for medical advice about side effects. You may report side effects to FDA  at 1-800-FDA-1088. Where should I keep my medicine? This drug is given in a hospital or clinic and will not be stored at home. NOTE: This sheet is a summary. It may not cover all possible information. If you have questions about this medicine, talk to your doctor, pharmacist, or health care provider.  2020 [employer]vier/Gold Standard (2013-06-10 14:19:39)

## 2019-07-14 LAB — T4: T4, Total: 6.9 ug/dL (ref 4.5–12.0)

## 2019-07-19 ENCOUNTER — Telehealth: Payer: Self-pay | Admitting: Nurse Practitioner

## 2019-07-19 ENCOUNTER — Ambulatory Visit: Payer: Self-pay | Admitting: Nurse Practitioner

## 2019-07-19 NOTE — Telephone Encounter (Signed)
Pt was called to reschedule appointment for today Pt did not show up

## 2019-07-23 NOTE — Progress Notes (Deleted)
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 Short term f/u of this lesion was recommended. CXR on 05/01/2018 performed with recommended f/u imaging. 2) 05/10/2019: presented to the ED with headache and left pelvis pain. Patient underwent CT C/A/P and MRI brain. Brain MRI showed numerous enhancing parenchymal intracranial lesions likely reflecting metastases. CT scan showed cavitary lesion in the right lung apex with spiculated margins. Additional sites of disease include lymph nodes of the chest and numerous liver mets. In the bones there were multiple lytic appearing lesions throughout the thoracic and lumbar spine as well as portions of the bony pelvis. Referred to Oncology. 3) 05/16/2019: US biopsy of the liver performed. Findings consistent with a poorly differentiated adenocarcinoma.  4) 05/19/2019: establish care with Dr. Lorenso Courier.  5) 05/30/2019: CT simulation. Received Radiation to brain/spine.  5) 06/23/2019: Received Cycle 1 Day 1 of Carbo/Pemetrexed while inpatient. Pembrolizumab not able to be administered in inpatient setting. 6) 07/13/2019:Cycle 2 Day 1 of Carboplatin/Pemetrexed/Pembrolizumab   7) 08/04/2019: intended Cycle 3 Day 1 of Carboplatin/Pemetrexed/Pembrolizumab    Interval History:  William Ali 34 y.o. male with medical history significant for metastatic adenocarcinoma of the lung who presents for a follow up visit. The patient's last visit was on 07/13/2019 at which time he was evaluated prior to the start of Cycle 2. In the interim since the last visit he has had no ED visits or symptom management visits.   On exam today William Ali notes ***  MEDICAL HISTORY:  Past Medical History:   Diagnosis Date  . Back pain   . Cancer ([place])   . Hypertension     SURGICAL HISTORY: Past Surgical History:  Procedure Laterality Date  . IR IMAGING GUIDED PORT INSERTION  06/12/2019  . KNEE SURGERY      SOCIAL HISTORY: Social History   Socioeconomic History  . Marital status: Married    Spouse name: Not on file  . Number of children: Not on file  . Years of education: Not on file  . Highest education level: Not on file  Occupational History  . Not on file  Tobacco Use  . Smoking status: Never Smoker  . Smokeless tobacco: Never Used  Vaping Use  . Vaping Use: Never used  Substance and Sexual Activity  . Alcohol use: No  . Drug use: No  . Sexual activity: Yes  Other Topics Concern  . Not on file  Social History Narrative  . Not on file   Social Determinants of Health   Financial Resource Strain:   . Difficulty of Paying Living Expenses:   Food Insecurity:   . Worried About Charity fundraiser in the Last Year:   . Arboriculturist in the Last Year:   Transportation Needs:   . Film/video editor (Medical):   Marland Kitchen Lack of Transportation (Non-Medical):   Physical Activity:   . Days of Exercise per Week:   . Minutes of Exercise per Session:   Stress:   . Feeling of Stress :   Social Connections:   . Frequency of Communication with Friends and Family:   . Frequency of Social Gatherings with Friends and Family:   . Attends Religious Services:   . Active Member of Clubs  or Organizations:   . Attends Archivist Meetings:   Marland Kitchen Marital Status:   Intimate Partner Violence:   . Fear of Current or Ex-Partner:   . Emotionally Abused:   Marland Kitchen Physically Abused:   . Sexually Abused:     FAMILY HISTORY: Family History  Problem Relation Age of Onset  . Hypertension Mother   . Diabetes Father     ALLERGIES:  is allergic to onion, other, peanut oil, and peanut-containing drug products.  MEDICATIONS:  Current Outpatient Medications  Medication Sig Dispense  Refill  . albuterol (VENTOLIN HFA) 108 (90 Base) MCG/ACT inhaler TAKE 2 PUFFS BY MOUTH EVERY 6 HOURS AS NEEDED FOR WHEEZE OR SHORTNESS OF BREATH 6.7 g 1  . alum & mag hydroxide-simeth (MAALOX MULTI SYMPTOM MAX ST) 400-400-40 MG/5ML suspension Take 10 mLs by mouth every 6 (six) hours as needed for indigestion. 355 mL 2  . amphetamine-dextroamphetamine (ADDERALL) 10 MG tablet Take 10 mg by mouth every morning, may take 79m additional dose daily as needed for fatigue 60 tablet 0  . celecoxib (CELEBREX) 200 MG capsule Take 1 capsule (200 mg total) by mouth daily. 30 capsule 0  . dexamethasone (DECADRON) 4 MG tablet Take 1 tablet (4 mg total) by mouth 2 (two) times daily with a meal. Starting on 5/11, and drop dose to once daily on 5/14 60 tablet 0  . DULoxetine (CYMBALTA) 30 MG capsule Take 1 capsule (30 mg total) by mouth daily. 30 capsule 0  . fentaNYL (DURAGESIC) 100 MCG/HR Place 1 patch onto the skin every 3 (three) days. 10 patch 0  . folic acid (FOLVITE) 1 MG tablet Take 1 tablet (1 mg total) by mouth daily. 60 tablet 3  . HYDROmorphone (DILAUDID) 8 MG tablet Take 1/2- 1 tablet every 4 hours as needed for pain 90 tablet 0  . lidocaine-prilocaine (EMLA) cream Apply 1 application topically as needed. 30 g 2  . metoprolol succinate (TOPROL XL) 50 MG 24 hr tablet Take 1 tablet (50 mg total) by mouth daily. Take with or immediately following a meal. 30 tablet 11  . metoprolol tartrate (LOPRESSOR) 25 MG tablet Take 1 tablet (25 mg total) by mouth 2 (two) times daily. 60 tablet 0  . nystatin (MYCOSTATIN) 100000 UNIT/ML suspension Take 5 mLs (500,000 Units total) by mouth 4 (four) times daily. 60 mL 0  . ondansetron (ZOFRAN) 4 MG tablet Take 1 tablet (4 mg total) by mouth every 8 (eight) hours as needed for nausea or vomiting. 60 tablet 0  . pantoprazole (PROTONIX) 40 MG tablet Take 1 tablet (40 mg total) by mouth 2 (two) times daily. 60 tablet 0  . polyethylene glycol (MIRALAX / GLYCOLAX) 17 g packet  Take 17 g by mouth daily. 14 each 0  . rivaroxaban (XARELTO) 20 MG TABS tablet Take 1 tablet (20 mg total) by mouth daily with supper. 30 tablet 1  . senna-docusate (SENOKOT-S) 8.6-50 MG tablet Take 2 tablets by mouth 2 (two) times daily. 120 tablet 0  . zolpidem (AMBIEN) 10 MG tablet Take 1 tablet (10 mg total) by mouth at bedtime as needed for up to 15 days for sleep. 15 tablet 0   No current facility-administered medications for this visit.    REVIEW OF SYSTEMS:   Constitutional: ( - ) fevers, ( - )  chills , ( - ) night sweats Eyes: ( - ) blurriness of vision, ( - ) double vision, ( - ) watery eyes Ears, nose, mouth, throat, and face: ( - )  mucositis, ( - ) sore throat Respiratory: ( - ) cough, ( - ) dyspnea, ( - ) wheezes Cardiovascular: ( - ) palpitation, ( - ) chest discomfort, ( - ) lower extremity swelling Gastrointestinal:  ( - ) nausea, ( - ) heartburn, ( - ) change in bowel habits Skin: ( - ) abnormal skin rashes Lymphatics: ( - ) new lymphadenopathy, ( - ) easy bruising Neurological: ( - ) numbness, ( - ) tingling, ( - ) new weaknesses Behavioral/Psych: ( - ) mood change, ( - ) new changes  All other systems were reviewed with the patient and are negative.  PHYSICAL EXAMINATION: ECOG PERFORMANCE STATUS: 2 - Symptomatic, <50% confined to bed  There were no vitals filed for this visit. There were no vitals filed for this visit.  GENERAL: well appearing young African American male in NAD  SKIN: skin color, texture, turgor are normal, no rashes or significant lesions EYES: conjunctiva are pink and non-injected, sclera clear LUNGS: clear to auscultation and percussion with normal breathing effort HEART: regular rate & rhythm and no murmurs and no lower extremity edema Musculoskeletal: no cyanosis of digits and no clubbing  PSYCH: alert & oriented x 3, fluent speech NEURO: no focal motor/sensory deficits  LABORATORY DATA:  I have reviewed the data as listed CBC Latest Ref  Rng & Units 07/13/2019 07/05/2019 06/28/2019  WBC 4.0 - 10.5 K/uL 3.5(L) 6.1 5.3  Hemoglobin 13.0 - 17.0 g/dL 9.4(L) 10.6(L) 9.4(L)  Hematocrit 39 - 52 % 30.1(L) 33.6(L) 29.8(L)  Platelets 150 - 400 K/uL 109(L) 73(L) 118(L)    CMP Latest Ref Rng & Units 07/13/2019 07/05/2019 06/28/2019  Glucose 70 - 99 mg/dL 153(H) 147(H) 154(H)  BUN 6 - 20 mg/dL 19 23(H) 25(H)  Creatinine 0.61 - 1.24 mg/dL 1.18 1.26(H) 1.16  Sodium 135 - 145 mmol/L 142 145 137  Potassium 3.5 - 5.1 mmol/L 3.5 3.8 4.7  Chloride 98 - 111 mmol/L 105 106 102  CO2 22 - 32 mmol/L _0 Calcium 8.9 - 10.3 mg/dL 8.9 9.0 8.3(L)  Total Protein 6.5 - 8.1 g/dL 6.5 6.7 5.8(L)  Total Bilirubin 0.3 - 1.2 mg/dL 0.5 0.4 0.7  Alkaline Phos 38 - 126 U/L 659(H) 422(H) 345(H)  AST 15 - 41 U/L 80(H) 64(H) 61(H)  ALT 0 - 44 U/L 305(HH) 172(H) 166(H)     RADIOGRAPHIC STUDIES: No results found.  ASSESSMENT & PLAN Jeanmarc Isidoro Ali 34 y.o. male with medical history significant for metastatic adenocarcinoma of the lung who presents for a follow up visit.  The patient has successfully completed Cycle 2 of carboplatin, pembrolizumab and pemetrexed, started on 07/13/2019.  Unfortunately Cycle 1 had to be administered in the inpatient setting because the patient was admitted for pain control.  As we were unsure when he would be discharged we decided to move forward with the treatment at that time.  Pembrolizumab was not able to be administered in house due to logistical and insurance issues.  The plan is currently for him to receive pembrolizumab with each subsequent cycle.  We are continuing to get better control of his pain with fentanyl patches as well as Dilaudid p.o. with some additional medications including Celebrex and Cymbalta.  Pain control has been improving.  In terms of appetite the patient has been eating well and is currently up 6 lbs..  He is off steroid therapy as he believes this was causing his abdominal pain to worsen.  Diagnostically  the patient had Foundation One  PD-L1 testing set for his tumor and unfortunately no targetable mutations were noted.  Given his young age and the aggressive nature of this cancer I  recommended we get Guardant 360 in order to try to detect any possible targetable mutations.  This was discussed with the lung expert onsite who notes that one test or the other can miss mutations in up to 15% of patients.  We are awaiting the results of this study.   Previously I made it clear to William Ali that all treatments for administering are palliative in nature and designed to shrink the tumor and reduce symptoms.  The goal is to extend life and provide him some quality of life.  Once again we reiterated that there is no cure for his disease and that all steps moving forward should be made with this in mind.  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 Pathology could not definitively say, but he had a prior nodule on CXR and mass in lung appears to be the primary lesion --tissue sent for Foundation One and PD-L1 immunohistochemistry with no targetable mutations noted. Will send for Guardant 360 with blood collection today --started Carbo/Pem/Pem (regimen detailed above) Cycle 1 Day 1 on 06/23/2019. Cycle 1 administered inpatient, pembrolizumab was not able to be added at that time. Plan for all subsequent cycles to have pembroliuzmab.  --pain control regimen as listed below   --RTC on Day 1 Cycle 3 (08/04/2019)   #Metastatic Lesions in the Brain --patient has neurological symptoms including headache, left leg weakness, and left eye vision changes -- [name] and radiation oncology are following, Appreciate assistance in management of brain mets.  --pain control as noted below.  #Pain Control, improving --pain is predominately in the back, head, and abdomen. Of note,  headaches have been improving  --patient has d/c steroid therapy as it made abdominal pain worse --currently on fentanyl patches 174mg q 72H --Taking dilaudid PO [id number] PRN --additionally is taking cymablata 3012mO and celebrex 200m67mily  --senna docusate/PRN miralax for opioid induced constipation  #Osseus Lesions/Hip Pain --oxycodone as above --recommend evaluation by radiation oncology  --encourage dental evaluation  --continue IV zometa therapy for bone protection. Started 07/13/2019, to continue q 3 months.   #Left Eye Vision Changes --[facility]ital [facility] --currently using eye patch over left eye.  --continue to monitor   # Weight Loss --encourage high calorie diet --possible increase appetite and weight from steroid therapy --continue to monitor  No orders of the defined types were placed in this encounter.  All questions were answered. The patient knows to call the clinic with any problems, questions or concerns.  A total of more than 30 minutes were spent on this encounter and over half of that time was spent on counseling and coordination of care as outlined above.   JohnLedell Peoples Department of Hematology/[facility]cology [facility]: [phone]: [phone]: [email or web address]  07/23/2019 4:04 PM

## 2019-07-24 ENCOUNTER — Inpatient Hospital Stay: Payer: PRIVATE HEALTH INSURANCE

## 2019-07-24 ENCOUNTER — Inpatient Hospital Stay: Payer: PRIVATE HEALTH INSURANCE | Admitting: Hematology and Oncology

## 2019-07-24 ENCOUNTER — Inpatient Hospital Stay (HOSPITAL_BASED_OUTPATIENT_CLINIC_OR_DEPARTMENT_OTHER): Payer: PRIVATE HEALTH INSURANCE | Admitting: Hematology and Oncology

## 2019-07-24 ENCOUNTER — Other Ambulatory Visit: Payer: Self-pay | Admitting: Hematology and Oncology

## 2019-07-24 ENCOUNTER — Other Ambulatory Visit: Payer: Self-pay

## 2019-07-24 ENCOUNTER — Encounter: Payer: Self-pay | Admitting: Hematology and Oncology

## 2019-07-24 VITALS — BP 137/94 | HR 108 | Temp 97.3°F | Resp 18 | Ht 67.0 in | Wt 200.6 lb

## 2019-07-24 DIAGNOSIS — I2694 Multiple subsegmental pulmonary emboli without acute cor pulmonale: Secondary | ICD-10-CM

## 2019-07-24 DIAGNOSIS — C3491 Malignant neoplasm of unspecified part of right bronchus or lung: Secondary | ICD-10-CM

## 2019-07-24 DIAGNOSIS — C7949 Secondary malignant neoplasm of other parts of nervous system: Secondary | ICD-10-CM

## 2019-07-24 DIAGNOSIS — Z5111 Encounter for antineoplastic chemotherapy: Secondary | ICD-10-CM | POA: Diagnosis not present

## 2019-07-24 DIAGNOSIS — C7931 Secondary malignant neoplasm of brain: Secondary | ICD-10-CM

## 2019-07-24 LAB — TSH: TSH: 0.147 u[IU]/mL — ABNORMAL LOW (ref 0.320–4.118)

## 2019-07-24 LAB — CMP (CANCER CENTER ONLY)
ALT: 152 U/L — ABNORMAL HIGH (ref 0–44)
AST: 44 U/L — ABNORMAL HIGH (ref 15–41)
Albumin: 3.2 g/dL — ABNORMAL LOW (ref 3.5–5.0)
Alkaline Phosphatase: 439 U/L — ABNORMAL HIGH (ref 38–126)
Anion gap: 13 (ref 5–15)
BUN: 22 mg/dL — ABNORMAL HIGH (ref 6–20)
CO2: 21 mmol/L — ABNORMAL LOW (ref 22–32)
Calcium: 8.4 mg/dL — ABNORMAL LOW (ref 8.9–10.3)
Chloride: 108 mmol/L (ref 98–111)
Creatinine: 1.05 mg/dL (ref 0.61–1.24)
GFR, Est AFR Am: >60 mL/min (ref >60)
GFR, Estimated: >60 mL/min (ref >60)
Glucose, Bld: 149 mg/dL — ABNORMAL HIGH (ref 70–99)
Potassium: 4.2 mmol/L (ref 3.5–5.1)
Sodium: 142 mmol/L (ref 135–145)
Total Bilirubin: 0.3 mg/dL (ref 0.3–1.2)
Total Protein: 6.7 g/dL (ref 6.5–8.1)

## 2019-07-24 LAB — CBC WITH DIFFERENTIAL (CANCER CENTER ONLY)
Abs Immature Granulocytes: 0.14 10*3/uL — ABNORMAL HIGH (ref 0.00–0.07)
Basophils Absolute: 0 10*3/uL (ref 0.0–0.1)
Basophils Relative: 0 %
Eosinophils Absolute: 0 10*3/uL (ref 0.0–0.5)
Eosinophils Relative: 0 %
HCT: 29.5 % — ABNORMAL LOW (ref 39.0–52.0)
Hemoglobin: 9.2 g/dL — ABNORMAL LOW (ref 13.0–17.0)
Immature Granulocytes: 3 %
Lymphocytes Relative: 3 %
Lymphs Abs: 0.2 10*3/uL — ABNORMAL LOW (ref 0.7–4.0)
MCH: 27.7 pg (ref 26.0–34.0)
MCHC: 31.2 g/dL (ref 30.0–36.0)
MCV: 88.9 fL (ref 80.0–100.0)
Monocytes Absolute: 0.6 10*3/uL (ref 0.1–1.0)
Monocytes Relative: 12 %
Neutro Abs: 4.4 10*3/uL (ref 1.7–7.7)
Neutrophils Relative %: 82 %
Platelet Count: 61 10*3/uL — ABNORMAL LOW (ref 150–400)
RBC: 3.32 MIL/uL — ABNORMAL LOW (ref 4.22–5.81)
RDW: 19.3 % — ABNORMAL HIGH (ref 11.5–15.5)
WBC Count: 5.3 10*3/uL (ref 4.0–10.5)
nRBC: 1.9 % — ABNORMAL HIGH (ref 0.0–0.2)

## 2019-07-24 MED ORDER — FENTANYL 75 MCG/HR TD PT72: 1.0000 | MEDICATED_PATCH | TRANSDERMAL | 0 refills | Status: AC

## 2019-07-24 MED ORDER — SODIUM CHLORIDE 0.9% FLUSH
10.0000 mL | Freq: Once | INTRAVENOUS | Status: AC | PRN
  Administered 2019-07-24: 10 mL
  Filled 2019-07-24: qty 10

## 2019-07-24 MED ORDER — HEPARIN SOD (PORK) LOCK FLUSH 100 UNIT/ML IV SOLN
500.0000 [IU] | Freq: Once | INTRAVENOUS | Status: AC | PRN
  Administered 2019-07-24: 500 [IU]
  Filled 2019-07-24: qty 5

## 2019-07-24 NOTE — Progress Notes (Signed)
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 Short term f/u of this lesion was recommended. CXR on 05/01/2018 performed with recommended f/u imaging. 2) 05/10/2019: presented to the ED with headache and left pelvis pain. Patient underwent CT C/A/P and MRI brain. Brain MRI showed numerous enhancing parenchymal intracranial lesions likely reflecting metastases. CT scan showed cavitary lesion in the right lung apex with spiculated margins. Additional sites of disease include lymph nodes of the chest and numerous liver mets. In the bones there were multiple lytic appearing lesions throughout the thoracic and lumbar spine as well as portions of the bony pelvis. Referred to Oncology. 3) 05/16/2019: US biopsy of the liver performed. Findings consistent with a poorly differentiated adenocarcinoma.  4) 05/19/2019: establish care with Dr. Lorenso Courier.  5) 05/30/2019: CT simulation. Received Radiation to brain/spine.  5) 06/23/2019: Received Cycle 1 Day 1 of Carbo/Pemetrexed while inpatient. Pembrolizumab not able to be administered in inpatient setting. 6) 07/13/2019:Cycle 2 Day 1 of Carboplatin/Pemetrexed/Pembrolizumab 7)  08/04/2019: intended Cycle 3 day 1 of Carboplatin/Pemetrexed/Pembrolizumab  Interval History:  William Ali 34 y.o. male with medical history significant for metastatic adenocarcinoma of the lung who presents for a follow up visit. The patient's last visit was on 07/13/2019 at which time he was evaluated prior to Cycle 2. In the interim since the last visit he has had no ED visits or symptom management visits.   On exam today Mr. Lichtenwalner notes that he has not required the Dilaudid p.o. medication over the  last several days.  He is currently taking 100 mcg of fentanyl via transdermal patch every 72 hours but the wife noticed that he is sleeping quite heavily and occasionally has episodes of apnea.  Additionally he has had some issues with myoclonic jerks particularly at night though during waking hours of the day.  He reports that he is not having any headaches but continues to take dexamethasone 4 mg p.o. daily.  He notes that his vision is improved and otherwise his pain is at a 4-5 out of 10 in severity.  He has been trying to stay active and his wife has been doing her best to keep him moving outside without pushing him too hard.  On further discussion he reports that his appetite has been quite good and that he has been eating without much limitation.  He does note that his weight has dropped slightly from 206 pounds down to 200, though it appears to be stable since the beginning of the month at 07/05/2019.  Otherwise he currently denies having any issues with fevers, chills, sweats, nausea, vomiting or diarrhea.  His bowels have been moving every few days and he does have MiraLAX available to help with that.  A full 10 point ROS is listed below.  MEDICAL HISTORY:  Past Medical History:  Diagnosis Date  . Back pain   . Cancer ([place])   . Hypertension     SURGICAL HISTORY: Past Surgical History:  Procedure Laterality Date  . IR IMAGING GUIDED PORT INSERTION  06/12/2019  . KNEE SURGERY      SOCIAL HISTORY: Social History   Socioeconomic History  . Marital status: Married    Spouse name: Not on file  . Number of children: Not on  file  . Years of education: Not on file  . Highest education level: Not on file  Occupational History  . Not on file  Tobacco Use  . Smoking status: Never Smoker  . Smokeless tobacco: Never Used  Vaping Use  . Vaping Use: Never used  Substance and Sexual Activity  . Alcohol use: No  . Drug use: No  . Sexual activity: Yes  Other Topics Concern  . Not on file   Social History Narrative  . Not on file   Social Determinants of Health   Financial Resource Strain:   . Difficulty of Paying Living Expenses:   Food Insecurity:   . Worried About Charity fundraiser in the Last Year:   . Arboriculturist in the Last Year:   Transportation Needs:   . Film/video editor (Medical):   Marland Kitchen Lack of Transportation (Non-Medical):   Physical Activity:   . Days of Exercise per Week:   . Minutes of Exercise per Session:   Stress:   . Feeling of Stress :   Social Connections:   . Frequency of Communication with Friends and Family:   . Frequency of Social Gatherings with Friends and Family:   . Attends Religious Services:   . Active Member of Clubs or Organizations:   . Attends Archivist Meetings:   Marland Kitchen Marital Status:   Intimate Partner Violence:   . Fear of Current or Ex-Partner:   . Emotionally Abused:   Marland Kitchen Physically Abused:   . Sexually Abused:     FAMILY HISTORY: Family History  Problem Relation Age of Onset  . Hypertension Mother   . Diabetes Father     ALLERGIES:  is allergic to onion, other, peanut oil, and peanut-containing drug products.  MEDICATIONS:  Current Outpatient Medications  Medication Sig Dispense Refill  . albuterol (VENTOLIN HFA) 108 (90 Base) MCG/ACT inhaler TAKE 2 PUFFS BY MOUTH EVERY 6 HOURS AS NEEDED FOR WHEEZE OR SHORTNESS OF BREATH 6.7 g 1  . alum & mag hydroxide-simeth (MAALOX MULTI SYMPTOM MAX ST) 400-400-40 MG/5ML suspension Take 10 mLs by mouth every 6 (six) hours as needed for indigestion. 355 mL 2  . amphetamine-dextroamphetamine (ADDERALL) 10 MG tablet Take 10 mg by mouth every morning, may take 83m additional dose daily as needed for fatigue 60 tablet 0  . celecoxib (CELEBREX) 200 MG capsule Take 1 capsule (200 mg total) by mouth daily. (Patient not taking: Reported on 07/24/2019) 30 capsule 0  . dexamethasone (DECADRON) 4 MG tablet Take 1 tablet (4 mg total) by mouth 2 (two) times daily with a meal.  Starting on 5/11, and drop dose to once daily on 5/14 60 tablet 0  . DULoxetine (CYMBALTA) 30 MG capsule Take 1 capsule (30 mg total) by mouth daily. 30 capsule 0  . fentaNYL (DURAGESIC) 75 MCG/HR Place 1 patch onto the skin every 3 (three) days for 3 days. 10 patch 0  . folic acid (FOLVITE) 1 MG tablet Take 1 tablet (1 mg total) by mouth daily. 60 tablet 3  . HYDROmorphone (DILAUDID) 8 MG tablet Take 1/2- 1 tablet every 4 hours as needed for pain 90 tablet 0  . lidocaine-prilocaine (EMLA) cream Apply 1 application topically as needed. 30 g 2  . metoprolol succinate (TOPROL XL) 50 MG 24 hr tablet Take 1 tablet (50 mg total) by mouth daily. Take with or immediately following a meal. 30 tablet 11  . metoprolol tartrate (LOPRESSOR) 25 MG tablet Take 1 tablet (  25 mg total) by mouth 2 (two) times daily. 60 tablet 0  . nystatin (MYCOSTATIN) 100000 UNIT/ML suspension Take 5 mLs (500,000 Units total) by mouth 4 (four) times daily. 60 mL 0  . ondansetron (ZOFRAN) 4 MG tablet Take 1 tablet (4 mg total) by mouth every 8 (eight) hours as needed for nausea or vomiting. 60 tablet 0  . pantoprazole (PROTONIX) 40 MG tablet Take 1 tablet (40 mg total) by mouth 2 (two) times daily. 60 tablet 0  . polyethylene glycol (MIRALAX / GLYCOLAX) 17 g packet Take 17 g by mouth daily. 14 each 0  . rivaroxaban (XARELTO) 20 MG TABS tablet Take 1 tablet (20 mg total) by mouth daily with supper. 30 tablet 1  . senna-docusate (SENOKOT-S) 8.6-50 MG tablet Take 2 tablets by mouth 2 (two) times daily. 120 tablet 0   No current facility-administered medications for this visit.    REVIEW OF SYSTEMS:   Constitutional: ( - ) fevers, ( - )  chills , ( - ) night sweats Eyes: ( - ) blurriness of vision, ( - ) double vision, ( - ) watery eyes Ears, nose, mouth, throat, and face: ( - ) mucositis, ( - ) sore throat Respiratory: ( - ) cough, ( - ) dyspnea, ( - ) wheezes Cardiovascular: ( - ) palpitation, ( - ) chest discomfort, ( - ) lower  extremity swelling Gastrointestinal:  ( - ) nausea, ( - ) heartburn, ( - ) change in bowel habits Skin: ( - ) abnormal skin rashes Lymphatics: ( - ) new lymphadenopathy, ( - ) easy bruising Neurological: ( - ) numbness, ( - ) tingling, ( - ) new weaknesses Behavioral/Psych: ( - ) mood change, ( - ) new changes  All other systems were reviewed with the patient and are negative.  PHYSICAL EXAMINATION: ECOG PERFORMANCE STATUS: 2 - Symptomatic, <50% confined to bed  Vitals:   07/24/19 1141  BP: (!) 137/94  Pulse: (!) 108  Resp: 18  Temp: (!) 97.3 F (36.3 C)  SpO2: 100%   Filed Weights   07/24/19 1141  Weight: 200 lb 9.6 oz (91 kg)    GENERAL: well appearing young African American male in NAD  SKIN: skin color, texture, turgor are normal, no rashes or significant lesions EYES: conjunctiva are pink and non-injected, sclera clear LUNGS: clear to auscultation and percussion with normal breathing effort HEART: regular rate & rhythm and no murmurs and no lower extremity edema Musculoskeletal: no cyanosis of digits and no clubbing  PSYCH: alert & oriented x 3, fluent speech NEURO: no focal motor/sensory deficits  LABORATORY DATA:  I have reviewed the data as listed CBC Latest Ref Rng & Units 07/24/2019 07/13/2019 07/05/2019  WBC 4.0 - 10.5 K/uL 5.3 3.5(L) 6.1  Hemoglobin 13.0 - 17.0 g/dL 9.2(L) 9.4(L) 10.6(L)  Hematocrit 39 - 52 % 29.5(L) 30.1(L) 33.6(L)  Platelets 150 - 400 K/uL 61(L) 109(L) 73(L)    CMP Latest Ref Rng & Units 07/24/2019 07/13/2019 07/05/2019  Glucose 70 - 99 mg/dL 149(H) 153(H) 147(H)  BUN 6 - 20 mg/dL 22(H) 19 23(H)  Creatinine 0.61 - 1.24 mg/dL 1.05 1.18 1.26(H)  Sodium 135 - 145 mmol/L 142 142 145  Potassium 3.5 - 5.1 mmol/L 4.2 3.5 3.8  Chloride 98 - 111 mmol/L 108 105 106  CO2 22 - 32 mmol/L 21(L) 24 24  Calcium 8.9 - 10.3 mg/dL 8.4(L) 8.9 9.0  Total Protein 6.5 - 8.1 g/dL 6.7 6.5 6.7  Total Bilirubin 0.3 -  1.2 mg/dL 0.3 0.5 0.4  Alkaline Phos 38 - 126 U/L  439(H) 659(H) 422(H)  AST 15 - 41 U/L 44(H) 80(H) 64(H)  ALT 0 - 44 U/L 152(H) 305(HH) 172(H)    RADIOGRAPHIC STUDIES: No results found.  ASSESSMENT & PLAN Jemel Isidoro Ali 34 y.o. male with medical history significant for metastatic adenocarcinoma of the lung who presents for a follow up visit.  The patient has successfully completed Cycle 1 of carboplatin and pemetrexed, started on 06/23/2019.  Unfortunately this had to be administered in the inpatient setting because the patient was admitted for pain control.  As we were unsure when he would be discharged we decided to move forward with the treatment at that time.  Pembrolizumab was not able to be administered in house due to logistical and insurance issues.  He received pembrolizumab during Cycle 2 which started on 07/13/2019.   We are continuing to get better control of his pain with fentanyl patches as well as Dilaudid p.o. with some additional medications including Celebrex and Cymbalta.  Pain control has been improving.  In terms of appetite the patient has been eating well and his weight has been stable this month.  Diagnostically the patient had Foundation One PD-L1 testing set for his tumor and unfortunately no targetable mutations were noted.  Given his young age and the aggressive nature of this cancer I recommended we get Guardant 360 in order to try to detect any possible targetable mutations.  This was discussed with the lung expert onsite who notes that one test or the other can miss mutations in up to 15% of patients. 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 This was shared with the patient today.   Previously I made it clear to Mr. Guia that all treatments for administering are palliative in nature and designed to shrink the tumor and reduce symptoms.  The goal is to extend life and provide him some quality of life.  Once again we reiterated that there is no cure for his disease  and that all steps moving forward should be made with this in mind.  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 Pathology could not definitively say, but he had a prior nodule on CXR and mass in lung appears to be the primary lesion --tissue sent for Foundation One and PD-L1 immunohistochemistry with no targetable mutations noted. Guardant 360 returned today with actionable EGFR mutation noted.  --started Carbo/Pem/Pem (regimen detailed above) Cycle 1 Day 1 on 06/23/2019. Cycle 1 administered inpatient, pembrolizumab was not able to be added at that time. Cycle 2 (07/13/2019) included pembroliuzmab, which will be included from here on out.  --pain control regimen as listed below   --RTC on Day 1 Cycle 3  #Metastatic Lesions in the Brain --patient has had neurological symptoms including headache, left leg weakness, and left eye vision changes -- [name] and radiation oncology are following, Appreciate assistance in management of brain mets.  --pain control as noted below.  #Pain Control, improving --pain is predominately in the back and abdomen. Of note, headaches have resloved --patient is taking dexamethasone 66m PO daily  --currently on fentanyl patches 1071m q 72H, will decrease to 7586mdue to increased sedation, sleep apnea symptoms, and myoclonic muscle jerks.  --dilaudid PO 4-8mg39mH PRN (not used for last several days).  --additionally is  taking cymablata 93m PO and celebrex 2022mdaily  --senna docusate/PRN miralax for opioid induced constipation  #Osseus Lesions/Hip Pain --dilaudid as above --recommend evaluation by radiation oncology  --encourage dental evaluation (has yet to be done) --continue IV zometa therapy for bone protection. Started 07/13/2019, to continue q 3 months.   #Left Eye Vision Changes --patient to be  [facility]-possible direct involvement of globe vs occipital lobe lesion --currently using eye patch over left eye.  --continue to monitor   # Weight Loss, stable --encourage high calorie diet --possible increase appetite and weight from steroid therapy --continue to monitor  #Pulmonary Embolism --continue Xarelto 2042mO daily   No orders of the defined types were placed in this encounter.  All questions were answered. The patient knows to call the clinic with any problems, questions or concerns.  A total of more than 30 minutes were spent on this encounter and over half of that time was spent on counseling and coordination of care as outlined above.   JohLedell PeoplesD Department of Hematology/Oncology [facility] Hospitalone: [phone]: [phone]: [email or web address]  07/24/2019 5:04 PM

## 2019-07-25 LAB — T4: T4, Total: 8 ug/dL (ref 4.5–12.0)

## 2019-07-26 ENCOUNTER — Other Ambulatory Visit: Payer: Self-pay | Admitting: Internal Medicine

## 2019-07-26 MED FILL — METOPROLOL SUCCINATE ER 50: 50 | 30 days supply | Qty: 30 | Fill #1

## 2019-07-26 MED FILL — fentaNYL 75 MCG/HR PT72: 75 | 30 days supply | Qty: 10 | Fill #0

## 2019-07-28 ENCOUNTER — Other Ambulatory Visit: Payer: Self-pay | Admitting: Internal Medicine

## 2019-07-28 ENCOUNTER — Other Ambulatory Visit: Payer: Self-pay | Admitting: Hematology and Oncology

## 2019-07-28 ENCOUNTER — Telehealth: Payer: Self-pay | Admitting: *Deleted

## 2019-07-28 ENCOUNTER — Other Ambulatory Visit (INDEPENDENT_AMBULATORY_CARE_PROVIDER_SITE_OTHER): Payer: Self-pay

## 2019-07-28 DIAGNOSIS — G893 Neoplasm related pain (acute) (chronic): Secondary | ICD-10-CM

## 2019-07-28 MED ORDER — HYDROMORPHONE HCL 8 MG PO TABS: ORAL_TABLET | ORAL | 0 refills | Status: DC

## 2019-07-28 MED ORDER — DULOXETINE HCL 30 MG PO CPEP: 30.0000 mg | ORAL_CAPSULE | Freq: Every day | ORAL | 3 refills | Status: DC

## 2019-07-28 NOTE — Telephone Encounter (Signed)
Call received to palliative care from patients wife for refill on dilaudid -also see phone request to Dr. Lorenso Courier. Will fill for now.   [name], DO Palliative Medicine

## 2019-07-28 NOTE — Telephone Encounter (Signed)
Received vm message from pt's wife requesting refill of his dilaudid. Last filled at [facility] on 06/28/19 for # 23.  Dr. Lorenso Courier made aware.

## 2019-07-28 NOTE — Progress Notes (Signed)
Wife called requesting cymbalta refill- script sent to [facility] on [address] w/ 3 refills.  [name], DO Palliative Medicine

## 2019-07-28 NOTE — Progress Notes (Signed)
Pharmacist Chemotherapy Monitoring - Follow Up Assessment    I verify that I have reviewed each item in the below checklist:  . Regimen for the patient is scheduled for the appropriate day and plan matches scheduled date. Marland Kitchen Appropriate non-routine labs are ordered dependent on drug ordered. . If applicable, additional medications reviewed and ordered per protocol based on lifetime cumulative doses and/or treatment regimen.   Plan for follow-up and/or issues identified: No . I-vent associated with next due treatment: No . MD and/or nursing notified: No  William Ali Course 07/28/2019 11:34 AM

## 2019-08-03 ENCOUNTER — Other Ambulatory Visit: Payer: Self-pay | Admitting: Hematology and Oncology

## 2019-08-03 ENCOUNTER — Other Ambulatory Visit: Payer: Self-pay | Admitting: Internal Medicine

## 2019-08-04 ENCOUNTER — Inpatient Hospital Stay: Payer: PRIVATE HEALTH INSURANCE

## 2019-08-04 ENCOUNTER — Other Ambulatory Visit: Payer: Self-pay

## 2019-08-04 ENCOUNTER — Other Ambulatory Visit: Payer: Self-pay | Admitting: Hematology and Oncology

## 2019-08-04 ENCOUNTER — Inpatient Hospital Stay: Payer: PRIVATE HEALTH INSURANCE | Attending: Hematology and Oncology | Admitting: Hematology and Oncology

## 2019-08-04 VITALS — BP 135/88 | HR 107 | Temp 97.5°F | Resp 18 | Ht 67.0 in | Wt 205.5 lb

## 2019-08-04 VITALS — HR 105

## 2019-08-04 DIAGNOSIS — C7931 Secondary malignant neoplasm of brain: Secondary | ICD-10-CM | POA: Diagnosis not present

## 2019-08-04 DIAGNOSIS — C7949 Secondary malignant neoplasm of other parts of nervous system: Secondary | ICD-10-CM

## 2019-08-04 DIAGNOSIS — Z5111 Encounter for antineoplastic chemotherapy: Secondary | ICD-10-CM | POA: Insufficient documentation

## 2019-08-04 DIAGNOSIS — C3491 Malignant neoplasm of unspecified part of right bronchus or lung: Secondary | ICD-10-CM

## 2019-08-04 DIAGNOSIS — Z79899 Other long term (current) drug therapy: Secondary | ICD-10-CM | POA: Insufficient documentation

## 2019-08-04 DIAGNOSIS — G893 Neoplasm related pain (acute) (chronic): Secondary | ICD-10-CM | POA: Diagnosis not present

## 2019-08-04 DIAGNOSIS — C787 Secondary malignant neoplasm of liver and intrahepatic bile duct: Secondary | ICD-10-CM | POA: Insufficient documentation

## 2019-08-04 LAB — CBC WITH DIFFERENTIAL (CANCER CENTER ONLY)
Abs Immature Granulocytes: 0.26 10*3/uL — ABNORMAL HIGH (ref 0.00–0.07)
Basophils Absolute: 0 10*3/uL (ref 0.0–0.1)
Basophils Relative: 0 %
Eosinophils Absolute: 0 10*3/uL (ref 0.0–0.5)
Eosinophils Relative: 0 %
HCT: 31.8 % — ABNORMAL LOW (ref 39.0–52.0)
Hemoglobin: 9.6 g/dL — ABNORMAL LOW (ref 13.0–17.0)
Immature Granulocytes: 5 %
Lymphocytes Relative: 4 %
Lymphs Abs: 0.2 10*3/uL — ABNORMAL LOW (ref 0.7–4.0)
MCH: 27.7 pg (ref 26.0–34.0)
MCHC: 30.2 g/dL (ref 30.0–36.0)
MCV: 91.6 fL (ref 80.0–100.0)
Monocytes Absolute: 1 10*3/uL (ref 0.1–1.0)
Monocytes Relative: 18 %
Neutro Abs: 4.2 10*3/uL (ref 1.7–7.7)
Neutrophils Relative %: 73 %
Platelet Count: 150 10*3/uL (ref 150–400)
RBC: 3.47 MIL/uL — ABNORMAL LOW (ref 4.22–5.81)
RDW: 21 % — ABNORMAL HIGH (ref 11.5–15.5)
WBC Count: 5.7 10*3/uL (ref 4.0–10.5)
nRBC: 4.9 % — ABNORMAL HIGH (ref 0.0–0.2)

## 2019-08-04 LAB — CMP (CANCER CENTER ONLY)
ALT: 117 U/L — ABNORMAL HIGH (ref 0–44)
AST: 39 U/L (ref 15–41)
Albumin: 3.2 g/dL — ABNORMAL LOW (ref 3.5–5.0)
Alkaline Phosphatase: 559 U/L — ABNORMAL HIGH (ref 38–126)
Anion gap: 14 (ref 5–15)
BUN: 19 mg/dL (ref 6–20)
CO2: 22 mmol/L (ref 22–32)
Calcium: 8.9 mg/dL (ref 8.9–10.3)
Chloride: 106 mmol/L (ref 98–111)
Creatinine: 1.15 mg/dL (ref 0.61–1.24)
GFR, Est AFR Am: >60 mL/min (ref >60)
GFR, Estimated: >60 mL/min (ref >60)
Glucose, Bld: 176 mg/dL — ABNORMAL HIGH (ref 70–99)
Potassium: 4.3 mmol/L (ref 3.5–5.1)
Sodium: 142 mmol/L (ref 135–145)
Total Bilirubin: 0.3 mg/dL (ref 0.3–1.2)
Total Protein: 7 g/dL (ref 6.5–8.1)

## 2019-08-04 LAB — TSH: TSH: 1.412 u[IU]/mL (ref 0.320–4.118)

## 2019-08-04 LAB — LACTATE DEHYDROGENASE: LDH: 833 U/L — ABNORMAL HIGH (ref 98–192)

## 2019-08-04 MED ORDER — SODIUM CHLORIDE 0.9 % IV SOLN
200.0000 mg | Freq: Once | INTRAVENOUS | Status: AC
  Administered 2019-08-04: 200 mg via INTRAVENOUS
  Filled 2019-08-04: qty 8

## 2019-08-04 MED ORDER — SODIUM CHLORIDE 0.9 % IV SOLN
500.0000 mg/m2 | Freq: Once | INTRAVENOUS | Status: AC
  Administered 2019-08-04: 1100 mg via INTRAVENOUS
  Filled 2019-08-04: qty 40

## 2019-08-04 MED ORDER — CYANOCOBALAMIN 1000 MCG/ML IJ SOLN
1000.0000 ug | Freq: Once | INTRAMUSCULAR | Status: AC
  Administered 2019-08-04: 1000 ug via INTRAMUSCULAR

## 2019-08-04 MED ORDER — SODIUM CHLORIDE 0.9 % IV SOLN
Freq: Once | INTRAVENOUS | Status: AC
  Filled 2019-08-04: qty 250

## 2019-08-04 MED ORDER — HYDROMORPHONE HCL 1 MG/ML IJ SOLN
INTRAMUSCULAR | Status: AC
  Filled 2019-08-04: qty 2

## 2019-08-04 MED ORDER — SODIUM CHLORIDE 0.9 % IV SOLN
710.0000 mg | Freq: Once | INTRAVENOUS | Status: AC
  Administered 2019-08-04: 710 mg via INTRAVENOUS
  Filled 2019-08-04: qty 71

## 2019-08-04 MED ORDER — HEPARIN SOD (PORK) LOCK FLUSH 100 UNIT/ML IV SOLN
500.0000 [IU] | Freq: Once | INTRAVENOUS | Status: DC | PRN
  Filled 2019-08-04: qty 5

## 2019-08-04 MED ORDER — PALONOSETRON HCL INJECTION 0.25 MG/5ML
INTRAVENOUS | Status: AC
  Filled 2019-08-04: qty 5

## 2019-08-04 MED ORDER — HYDROMORPHONE HCL 1 MG/ML IJ SOLN
2.0000 mg | Freq: Once | INTRAMUSCULAR | Status: AC
  Administered 2019-08-04: 2 mg via INTRAVENOUS

## 2019-08-04 MED ORDER — PALONOSETRON HCL INJECTION 0.25 MG/5ML
0.2500 mg | Freq: Once | INTRAVENOUS | Status: AC
  Administered 2019-08-04: 0.25 mg via INTRAVENOUS

## 2019-08-04 MED ORDER — SODIUM CHLORIDE 0.9 % IV SOLN
150.0000 mg | Freq: Once | INTRAVENOUS | Status: AC
  Administered 2019-08-04: 150 mg via INTRAVENOUS
  Filled 2019-08-04: qty 150

## 2019-08-04 MED ORDER — SODIUM CHLORIDE 0.9% FLUSH
10.0000 mL | INTRAVENOUS | Status: DC | PRN
  Administered 2019-08-04: 10 mL
  Filled 2019-08-04: qty 10

## 2019-08-04 MED ORDER — CYANOCOBALAMIN 1000 MCG/ML IJ SOLN
INTRAMUSCULAR | Status: AC
  Filled 2019-08-04: qty 1

## 2019-08-04 MED ORDER — SODIUM CHLORIDE 0.9% FLUSH
10.0000 mL | Freq: Once | INTRAVENOUS | Status: AC | PRN
  Administered 2019-08-04: 10 mL
  Filled 2019-08-04: qty 10

## 2019-08-04 MED ORDER — HEPARIN SOD (PORK) LOCK FLUSH 100 UNIT/ML IV SOLN
500.0000 [IU] | Freq: Once | INTRAVENOUS | Status: AC | PRN
  Administered 2019-08-04: 500 [IU]
  Filled 2019-08-04: qty 5

## 2019-08-04 MED ORDER — SODIUM CHLORIDE 0.9 % IV SOLN
10.0000 mg | Freq: Once | INTRAVENOUS | Status: AC
  Administered 2019-08-04: 10 mg via INTRAVENOUS
  Filled 2019-08-04: qty 10

## 2019-08-04 NOTE — Progress Notes (Signed)
Per Dr. Lorenso Courier, [place] to treat with elevated ALT and pulse 105.

## 2019-08-04 NOTE — Patient Instructions (Signed)
[facility] Discharge Instructions for Patients Receiving Chemotherapy  Today you received the following chemotherapy agents: Keytruda, Alimta, Carboplatin   To help prevent nausea and vomiting after your treatment, we encourage you to take your nausea medication as directed.    If you develop nausea and vomiting that is not controlled by your nausea medication, call the clinic.   BELOW ARE SYMPTOMS THAT SHOULD BE REPORTED IMMEDIATELY:  *FEVER GREATER THAN 100.5 F  *CHILLS WITH OR WITHOUT FEVER  NAUSEA AND VOMITING THAT IS NOT CONTROLLED WITH YOUR NAUSEA MEDICATION  *UNUSUAL SHORTNESS OF BREATH  *UNUSUAL BRUISING OR BLEEDING  TENDERNESS IN MOUTH AND THROAT WITH OR WITHOUT PRESENCE OF ULCERS  *URINARY PROBLEMS  *BOWEL PROBLEMS  UNUSUAL RASH Items with * indicate a potential emergency and should be followed up as soon as possible.  Feel free to call the clinic should you have any questions or concerns. The clinic phone number is [phone].  Please show the [facility] at check-in to the Emergency Department and triage nurse.

## 2019-08-05 LAB — T4: T4, Total: 6.7 ug/dL (ref 4.5–12.0)

## 2019-08-06 ENCOUNTER — Encounter: Payer: Self-pay | Admitting: Hematology and Oncology

## 2019-08-06 NOTE — Progress Notes (Signed)
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 Short term f/u of this lesion was recommended. CXR on 05/01/2018 performed with recommended f/u imaging. 2) 05/10/2019: presented to the ED with headache and left pelvis pain. Patient underwent CT C/A/P and MRI brain. Brain MRI showed numerous enhancing parenchymal intracranial lesions likely reflecting metastases. CT scan showed cavitary lesion in the right lung apex with spiculated margins. Additional sites of disease include lymph nodes of the chest and numerous liver mets. In the bones there were multiple lytic appearing lesions throughout the thoracic and lumbar spine as well as portions of the bony pelvis. Referred to Oncology. 3) 05/16/2019: US biopsy of the liver performed. Findings consistent with a poorly differentiated adenocarcinoma.  4) 05/19/2019: establish care with Dr. Lorenso Courier.  5) 05/30/2019: CT simulation. Received Radiation to brain/spine.  5) 06/23/2019: Received Cycle 1 Day 1 of Carbo/Pemetrexed while inpatient. Pembrolizumab not able to be administered in inpatient setting. 6) 07/13/2019:Cycle 2 Day 1 of Carboplatin/Pemetrexed/Pembrolizumab 7)  08/04/2019:Cycle 3 day 1 of Carboplatin/Pemetrexed/Pembrolizumab  Interval History:  William Ali 34 y.o. male with medical history significant for metastatic adenocarcinoma of the lung who presents for a follow up visit. The patient's last visit was on 07/24/2019 at which time he was evaluated after Cycle 2. In the interim since the last visit he has had no ED visits or symptom management visits.   On exam today William Ali that he has not required the Dilaudid p.o. medication and had been doing  well, but today had a flare in his pain.  During his last visit we decreased the fentanyl patch down to 75 mcg every 72 hours.  William Ali Ali that this has been adequate pain control but today is somewhat different.  He Ali that everything else has been going quite well.  He reports that he has had good appetite but has had some issues with constipation and did note a single episode of having blood in his underwear.  He does also with urinary symptoms he has seen some hesitancy and decrease in his ability to obtain erections.  He reports that since we started fentanyl patches he is only had about 2-3 erections in total.  He denies having any morning erections.  Mr. Fluegel also Ali that his breathing has been quite good and he has had virtually no episodes of coughing up blood clots.  He had an increase in his weight up about 5 pounds from his last visit.  Otherwise he denies having any issues with fevers, chills, sweats, nausea, vomiting or diarrhea.  A full 10 point ROS is listed below.  MEDICAL HISTORY:  Past Medical History:  Diagnosis Date  . Back pain   . Cancer ([place])   . Hypertension     SURGICAL HISTORY: Past Surgical History:  Procedure Laterality Date  . IR IMAGING GUIDED PORT INSERTION  06/12/2019  . KNEE SURGERY      SOCIAL HISTORY: Social History   Socioeconomic History  . Marital status: Married    Spouse name: Not on file  . Number of children: Not on file  . Years of education: Not on file  . Highest education level: Not on file  Occupational History  . Not on file  Tobacco  Use  . Smoking status: Never Smoker  . Smokeless tobacco: Never Used  Vaping Use  . Vaping Use: Never used  Substance and Sexual Activity  . Alcohol use: No  . Drug use: No  . Sexual activity: Yes  Other Topics Concern  . Not on file  Social History Narrative  . Not on file   Social Determinants of Health   Financial Resource Strain:   . Difficulty of Paying Living Expenses:    Food Insecurity:   . Worried About Charity fundraiser in the Last Year:   . Arboriculturist in the Last Year:   Transportation Needs:   . Film/video editor (Medical):   Marland Kitchen Lack of Transportation (Non-Medical):   Physical Activity:   . Days of Exercise per Week:   . Minutes of Exercise per Session:   Stress:   . Feeling of Stress :   Social Connections:   . Frequency of Communication with Friends and Family:   . Frequency of Social Gatherings with Friends and Family:   . Attends Religious Services:   . Active Member of Clubs or Organizations:   . Attends Archivist Meetings:   Marland Kitchen Marital Status:   Intimate Partner Violence:   . Fear of Current or Ex-Partner:   . Emotionally Abused:   Marland Kitchen Physically Abused:   . Sexually Abused:     FAMILY HISTORY: Family History  Problem Relation Age of Onset  . Hypertension Mother   . Diabetes Father     ALLERGIES:  is allergic to onion, other, peanut oil, and peanut-containing drug products.  MEDICATIONS:  Current Outpatient Medications  Medication Sig Dispense Refill  . albuterol (VENTOLIN HFA) 108 (90 Base) MCG/ACT inhaler TAKE 2 PUFFS BY MOUTH EVERY 6 HOURS AS NEEDED FOR WHEEZE OR SHORTNESS OF BREATH 6.7 g 1  . alum & mag hydroxide-simeth (MAALOX MULTI SYMPTOM MAX ST) 400-400-40 MG/5ML suspension Take 10 mLs by mouth every 6 (six) hours as needed for indigestion. 355 mL 2  . amphetamine-dextroamphetamine (ADDERALL) 10 MG tablet Take 10 mg by mouth every morning, may take 2m additional dose daily as needed for fatigue 60 tablet 0  . celecoxib (CELEBREX) 200 MG capsule Take 1 capsule (200 mg total) by mouth daily. (Patient not taking: Reported on 07/24/2019) 30 capsule 0  . dexamethasone (DECADRON) 4 MG tablet Take 1 tablet (4 mg total) by mouth 2 (two) times daily with a meal. Starting on 5/11, and drop dose to once daily on 5/14 60 tablet 0  . DULoxetine (CYMBALTA) 30 MG capsule Take 1 capsule (30 mg total) by mouth daily.  30 capsule 3  . folic acid (FOLVITE) 1 MG tablet Take 1 tablet (1 mg total) by mouth daily. 60 tablet 3  . HYDROmorphone (DILAUDID) 8 MG tablet Take 1/2- 1 tablet every 4 hours as needed for pain 90 tablet 0  . lidocaine-prilocaine (EMLA) cream Apply 1 application topically as needed. 30 g 2  . metoprolol succinate (TOPROL XL) 50 MG 24 hr tablet Take 1 tablet (50 mg total) by mouth daily. Take with or immediately following a meal. 30 tablet 11  . metoprolol tartrate (LOPRESSOR) 25 MG tablet Take 1 tablet (25 mg total) by mouth 2 (two) times daily. 60 tablet 0  . nystatin (MYCOSTATIN) 100000 UNIT/ML suspension Take 5 mLs (500,000 Units total) by mouth 4 (four) times daily. 60 mL 0  . ondansetron (ZOFRAN) 4 MG tablet Take 1 tablet (4 mg total) by mouth  every 8 (eight) hours as needed for nausea or vomiting. 60 tablet 0  . pantoprazole (PROTONIX) 40 MG tablet Take 1 tablet (40 mg total) by mouth 2 (two) times daily. 60 tablet 0  . polyethylene glycol (MIRALAX / GLYCOLAX) 17 g packet Take 17 g by mouth daily. 14 each 0  . rivaroxaban (XARELTO) 20 MG TABS tablet Take 1 tablet (20 mg total) by mouth daily with supper. 30 tablet 1  . senna-docusate (SENOKOT-S) 8.6-50 MG tablet Take 2 tablets by mouth 2 (two) times daily. 120 tablet 0   No current facility-administered medications for this visit.    REVIEW OF SYSTEMS:   Constitutional: ( - ) fevers, ( - )  chills , ( - ) night sweats Eyes: ( - ) blurriness of vision, ( - ) double vision, ( - ) watery eyes Ears, nose, mouth, throat, and face: ( - ) mucositis, ( - ) sore throat Respiratory: ( - ) cough, ( - ) dyspnea, ( - ) wheezes Cardiovascular: ( - ) palpitation, ( - ) chest discomfort, ( - ) lower extremity swelling Gastrointestinal:  ( - ) nausea, ( - ) heartburn, ( - ) change in bowel habits Skin: ( - ) abnormal skin rashes Lymphatics: ( - ) new lymphadenopathy, ( - ) easy bruising Neurological: ( - ) numbness, ( - ) tingling, ( - ) new  weaknesses Behavioral/Psych: ( - ) mood change, ( - ) new changes  All other systems were reviewed with the patient and are negative.  PHYSICAL EXAMINATION: ECOG PERFORMANCE STATUS: 2 - Symptomatic, <50% confined to bed  Vitals:   08/04/19 0954  BP: 135/88  Pulse: (!) 107  Resp: 18  Temp: (!) 97.5 F (36.4 C)  SpO2: 100%   Filed Weights   08/04/19 0954  Weight: 205 lb 8 oz (93.2 kg)    GENERAL: well appearing young African American male in NAD  SKIN: skin color, texture, turgor are normal, no rashes or significant lesions EYES: conjunctiva are pink and non-injected, sclera clear LUNGS: clear to auscultation and percussion with normal breathing effort HEART: regular rate & rhythm and no murmurs and no lower extremity edema Musculoskeletal: no cyanosis of digits and no clubbing  PSYCH: alert & oriented x 3, fluent speech NEURO: no focal motor/sensory deficits  LABORATORY DATA:  I have reviewed the data as listed CBC Latest Ref Rng & Units 08/04/2019 07/24/2019 07/13/2019  WBC 4.0 - 10.5 K/uL 5.7 5.3 3.5(L)  Hemoglobin 13.0 - 17.0 g/dL 9.6(L) 9.2(L) 9.4(L)  Hematocrit 39 - 52 % 31.8(L) 29.5(L) 30.1(L)  Platelets 150 - 400 K/uL 150 61(L) 109(L)    CMP Latest Ref Rng & Units 08/04/2019 07/24/2019 07/13/2019  Glucose 70 - 99 mg/dL 176(H) 149(H) 153(H)  BUN 6 - 20 mg/dL 19 22(H) 19  Creatinine 0.61 - 1.24 mg/dL 1.15 1.05 1.18  Sodium 135 - 145 mmol/L 142 142 142  Potassium 3.5 - 5.1 mmol/L 4.3 4.2 3.5  Chloride 98 - 111 mmol/L 106 108 105  CO2 22 - 32 mmol/L 22 21(L) 24  Calcium 8.9 - 10.3 mg/dL 8.9 8.4(L) 8.9  Total Protein 6.5 - 8.1 g/dL 7.0 6.7 6.5  Total Bilirubin 0.3 - 1.2 mg/dL 0.3 0.3 0.5  Alkaline Phos 38 - 126 U/L 559(H) 439(H) 659(H)  AST 15 - 41 U/L 39 44(H) 80(H)  ALT 0 - 44 U/L 117(H) 152(H) 305(HH)    RADIOGRAPHIC STUDIES: No results found.  ASSESSMENT & PLAN Camdon Isidoro Donning 34 y.o.  male with medical history significant for metastatic adenocarcinoma of the  lung who presents for a follow up visit.  The patient successfully completed Cycle 1 of carboplatin and pemetrexed, started on 06/23/2019.  Unfortunately this had to be administered in the inpatient setting because the patient was admitted for pain control.  As we were unsure when he would be discharged we decided to move forward with the treatment at that time.  Pembrolizumab was not able to be administered in house due to logistical and insurance issues.  He received pembrolizumab during Cycle 2 which started on 07/13/2019.   We have better control of his pain with fentanyl patches as well as Dilaudid p.o. (which he has not needed over the last several weeks) with some additional medications including Celebrex and Cymbalta.  Pain control has been improving, but he had some worsening today.  In terms of appetite the patient has been eating well and his weight has been increasing and no other symptoms are present.   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 This will give [place] therapeutic options later in his treatment course. Previously I made it clear to William Ali that all treatments for administering are palliative in nature and designed to shrink the tumor and reduce symptoms.  The goal is to extend life and provide him some quality of life.  Once again we reiterated that there is no cure for his disease and that all steps moving forward should be made with this in mind.  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 Pathology could not definitively say, but he had a prior nodule on CXR and mass in lung appears to be the primary lesion -- Guardant 360 with actionable EGFR mutation noted.  --started Carbo/Pem/Pem (regimen detailed above) Cycle 1 Day 1 on 06/23/2019. Cycle 1 administered inpatient,  pembrolizumab was not able to be added at that time. Cycle 2 (07/13/2019) included pembroliuzmab, which will be included in all subsequent cycles.  --repeat CT scan due to be performed in August 2021.  --pain control regimen as listed below   --RTC on Day 1 Cycle 4  #Metastatic Lesions in the Brain --patient has had neurological symptoms including headache, left leg weakness, and left eye vision changes -- [name] and radiation oncology are following, Appreciate assistance in management of brain mets.  --pain control as noted below.  #Pain Control, improving --pain is predominately in the back and abdomen. Of note, headaches have resloved --patient is taking dexamethasone 1m PO daily  --currently on fentanyl patches 75 mcg q 72H, pain is improved with less side effects.  --dilaudid PO [id number] PRN (not used for last several weeks).  --additionally is taking cymablata 3010mO and celebrex 200m41mily  --senna docusate/PRN miralax for opioid induced constipation  #Osseus Lesions/Hip Pain --dilaudid as above --recommend evaluation by radiation oncology  --encourage dental evaluation (has yet to be done) --continue IV zometa therapy for bone protection. Started 07/13/2019, to continue q 3 months. Next due Sept 2021.   #Left Eye Vision Changes --[facility] vs [facility]ipital lobe [facility] --currently using eye patch over left eye.  --continue to monitor   # Weight Loss, stable --encourage high calorie diet --possible increase appetite and weight from steroid therapy --continue to monitor  #Pulmonary Embolism --  continue Xarelto 23m PO daily   No orders of the defined types were placed in this encounter.  All questions were answered. The patient knows to call the clinic with any problems, questions or concerns.  A total of more than 30 minutes were spent on this encounter and over half of  that time was spent on counseling and coordination of care as outlined above.   JLedell Peoples MD Department of Hematology/Oncology [facility]Phone: [phone]: [phone]: [email or web address]  08/06/2019 5:58 PM

## 2019-08-09 ENCOUNTER — Telehealth: Payer: Self-pay | Admitting: *Deleted

## 2019-08-09 ENCOUNTER — Other Ambulatory Visit: Payer: Self-pay | Admitting: *Deleted

## 2019-08-09 ENCOUNTER — Encounter: Payer: Self-pay | Admitting: Hematology and Oncology

## 2019-08-09 ENCOUNTER — Encounter: Payer: Self-pay | Admitting: *Deleted

## 2019-08-09 DIAGNOSIS — C3491 Malignant neoplasm of unspecified part of right bronchus or lung: Secondary | ICD-10-CM

## 2019-08-09 DIAGNOSIS — C7949 Secondary malignant neoplasm of other parts of nervous system: Secondary | ICD-10-CM

## 2019-08-09 DIAGNOSIS — I2694 Multiple subsegmental pulmonary emboli without acute cor pulmonale: Secondary | ICD-10-CM

## 2019-08-09 DIAGNOSIS — C7931 Secondary malignant neoplasm of brain: Secondary | ICD-10-CM

## 2019-08-09 MED ORDER — RIVAROXABAN 20 MG PO TABS: 20.0000 mg | ORAL_TABLET | Freq: Every day | ORAL | 1 refills | Status: DC

## 2019-08-09 MED FILL — XARELTO 20 MG TABLET: 20 | 30 days supply | Qty: 30 | Fill #0

## 2019-08-09 NOTE — Telephone Encounter (Signed)
Received call from Johnnye Lana, [place] with [facility]. She has recently spoken to pt's wife, Cindra Eves. Cindra Eves is asking for referral for home PT/OT and evaluate for DME Referral placed for [facility] and spoke with [place] about referral.  She will review pt's chart and call back.

## 2019-08-09 NOTE — Progress Notes (Signed)
[place] Work  Holiday representative received call from patients significant other, [place] requesting assistance at home.  CSW and William Ali discussed patient and caregiver needs.  William Ali expressed DME needs and home care needs.  She is hopeful to return to work, but would like to establish care/support for patient.  William Ali also reported patient received his Medicaid card in the mail today.  Now that patient has Medicaid he is eligible for personal care services (PCS).  CSW shared patients request with RN, who will complete home health referral and DME orders.  CSW also sent PCS form to RN.  CSW encouraged patient to apply for [facility] grant.  CSW emailed financial advocate with patient request.  William Ali was appreciative of CSW support, and will call with any additional questions or concerns.    William Ali, MSW, LCSW, OSW-C Clinical Social Worker [facility] [phone]

## 2019-08-10 ENCOUNTER — Encounter: Payer: Self-pay | Admitting: Hematology and Oncology

## 2019-08-10 NOTE — Progress Notes (Signed)
Received referral from social worker regarding spouse with financial concerns.  Called spouse and introduced myself as Arboriculturist and to offer available resources.  Discussed one-time $1000 Radio broadcast assistant to assist with personal expenses while going through treatment. Based on verbal income, they qualify for the grant. Advised what is needed to apply. She will provide documentation on 08/24/19 at appointment.  Gave her my contact name and number for any additional financial questions or concerns.

## 2019-08-14 ENCOUNTER — Telehealth: Payer: Self-pay | Admitting: *Deleted

## 2019-08-14 ENCOUNTER — Other Ambulatory Visit: Payer: Self-pay | Admitting: Internal Medicine

## 2019-08-14 ENCOUNTER — Encounter: Payer: Self-pay | Admitting: Hematology and Oncology

## 2019-08-14 ENCOUNTER — Other Ambulatory Visit: Payer: Self-pay | Admitting: *Deleted

## 2019-08-14 ENCOUNTER — Encounter: Payer: Self-pay | Admitting: Internal Medicine

## 2019-08-14 DIAGNOSIS — C7931 Secondary malignant neoplasm of brain: Secondary | ICD-10-CM

## 2019-08-14 DIAGNOSIS — C7949 Secondary malignant neoplasm of other parts of nervous system: Secondary | ICD-10-CM

## 2019-08-14 DIAGNOSIS — C3491 Malignant neoplasm of unspecified part of right bronchus or lung: Secondary | ICD-10-CM

## 2019-08-14 NOTE — Telephone Encounter (Signed)
Received My Chart message from patient's wife, Cindra Eves and shared with Dr. Lorenso Courier.  Per Dr. Lorenso Courier, pt can be seen on Wednesday @ 1pm with labs at 12:30.   TCT the home and spoke with [facility].  Advised of appts on 08/16/19. She and Karla are good with that. Advised that if the swelling in face and neck get worse to call back and we can have him seen in [facility]. She voiced understanding. High priority scheduling message sent and lab orders placed.

## 2019-08-15 ENCOUNTER — Encounter: Payer: Self-pay | Admitting: *Deleted

## 2019-08-15 ENCOUNTER — Other Ambulatory Visit: Payer: Self-pay | Admitting: Hematology and Oncology

## 2019-08-15 DIAGNOSIS — C7931 Secondary malignant neoplasm of brain: Secondary | ICD-10-CM

## 2019-08-16 ENCOUNTER — Inpatient Hospital Stay: Payer: PRIVATE HEALTH INSURANCE

## 2019-08-16 ENCOUNTER — Inpatient Hospital Stay (HOSPITAL_BASED_OUTPATIENT_CLINIC_OR_DEPARTMENT_OTHER): Payer: PRIVATE HEALTH INSURANCE | Admitting: Hematology and Oncology

## 2019-08-16 ENCOUNTER — Other Ambulatory Visit: Payer: Self-pay

## 2019-08-16 ENCOUNTER — Telehealth: Payer: Self-pay | Admitting: *Deleted

## 2019-08-16 ENCOUNTER — Other Ambulatory Visit: Payer: Self-pay | Admitting: *Deleted

## 2019-08-16 VITALS — BP 122/85 | HR 98 | Temp 97.7°F | Resp 14 | Wt 201.7 lb

## 2019-08-16 DIAGNOSIS — C3491 Malignant neoplasm of unspecified part of right bronchus or lung: Secondary | ICD-10-CM

## 2019-08-16 DIAGNOSIS — C7931 Secondary malignant neoplasm of brain: Secondary | ICD-10-CM

## 2019-08-16 DIAGNOSIS — C3490 Malignant neoplasm of unspecified part of unspecified bronchus or lung: Secondary | ICD-10-CM

## 2019-08-16 DIAGNOSIS — C7949 Secondary malignant neoplasm of other parts of nervous system: Secondary | ICD-10-CM

## 2019-08-16 DIAGNOSIS — Z95828 Presence of other vascular implants and grafts: Secondary | ICD-10-CM

## 2019-08-16 DIAGNOSIS — Z5111 Encounter for antineoplastic chemotherapy: Secondary | ICD-10-CM | POA: Diagnosis not present

## 2019-08-16 LAB — CMP (CANCER CENTER ONLY)
ALT: 136 U/L — ABNORMAL HIGH (ref 0–44)
AST: 48 U/L — ABNORMAL HIGH (ref 15–41)
Albumin: 3.2 g/dL — ABNORMAL LOW (ref 3.5–5.0)
Alkaline Phosphatase: 446 U/L — ABNORMAL HIGH (ref 38–126)
Anion gap: 11 (ref 5–15)
BUN: 27 mg/dL — ABNORMAL HIGH (ref 6–20)
CO2: 21 mmol/L — ABNORMAL LOW (ref 22–32)
Calcium: 9.2 mg/dL (ref 8.9–10.3)
Chloride: 107 mmol/L (ref 98–111)
Creatinine: 1.19 mg/dL (ref 0.61–1.24)
GFR, Est AFR Am: >60 mL/min (ref >60)
GFR, Estimated: >60 mL/min (ref >60)
Glucose, Bld: 135 mg/dL — ABNORMAL HIGH (ref 70–99)
Potassium: 4.6 mmol/L (ref 3.5–5.1)
Sodium: 139 mmol/L (ref 135–145)
Total Bilirubin: 0.3 mg/dL (ref 0.3–1.2)
Total Protein: 7 g/dL (ref 6.5–8.1)

## 2019-08-16 LAB — CBC WITH DIFFERENTIAL (CANCER CENTER ONLY)
Abs Immature Granulocytes: 0.05 10*3/uL (ref 0.00–0.07)
Basophils Absolute: 0 10*3/uL (ref 0.0–0.1)
Basophils Relative: 0 %
Eosinophils Absolute: 0 10*3/uL (ref 0.0–0.5)
Eosinophils Relative: 0 %
HCT: 31.5 % — ABNORMAL LOW (ref 39.0–52.0)
Hemoglobin: 9.9 g/dL — ABNORMAL LOW (ref 13.0–17.0)
Immature Granulocytes: 1 %
Lymphocytes Relative: 1 %
Lymphs Abs: 0.1 10*3/uL — ABNORMAL LOW (ref 0.7–4.0)
MCH: 29.2 pg (ref 26.0–34.0)
MCHC: 31.4 g/dL (ref 30.0–36.0)
MCV: 92.9 fL (ref 80.0–100.0)
Monocytes Absolute: 0.6 10*3/uL (ref 0.1–1.0)
Monocytes Relative: 9 %
Neutro Abs: 6.3 10*3/uL (ref 1.7–7.7)
Neutrophils Relative %: 89 %
Platelet Count: 83 10*3/uL — ABNORMAL LOW (ref 150–400)
RBC: 3.39 MIL/uL — ABNORMAL LOW (ref 4.22–5.81)
RDW: 20.7 % — ABNORMAL HIGH (ref 11.5–15.5)
WBC Count: 7.1 10*3/uL (ref 4.0–10.5)
nRBC: 0.7 % — ABNORMAL HIGH (ref 0.0–0.2)

## 2019-08-16 LAB — TSH: TSH: 0.537 u[IU]/mL (ref 0.320–4.118)

## 2019-08-16 MED ORDER — HEPARIN SOD (PORK) LOCK FLUSH 100 UNIT/ML IV SOLN
500.0000 [IU] | Freq: Once | INTRAVENOUS | Status: AC
  Administered 2019-08-16: 500 [IU] via INTRAVENOUS
  Filled 2019-08-16: qty 5

## 2019-08-16 MED ORDER — SODIUM CHLORIDE 0.9% FLUSH
10.0000 mL | INTRAVENOUS | Status: DC | PRN
  Administered 2019-08-16: 10 mL via INTRAVENOUS
  Filled 2019-08-16: qty 10

## 2019-08-16 NOTE — Progress Notes (Signed)
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 Short term f/u of this lesion was recommended. CXR on 05/01/2018 performed with recommended f/u imaging. 2) 05/10/2019: presented to the ED with headache and left pelvis pain. Patient underwent CT C/A/P and MRI brain. Brain MRI showed numerous enhancing parenchymal intracranial lesions likely reflecting metastases. CT scan showed cavitary lesion in the right lung apex with spiculated margins. Additional sites of disease include lymph nodes of the chest and numerous liver mets. In the bones there were multiple lytic appearing lesions throughout the thoracic and lumbar spine as well as portions of the bony pelvis. Referred to Oncology. 3) 05/16/2019: US biopsy of the liver performed. Findings consistent with a poorly differentiated adenocarcinoma.  4) 05/19/2019: establish care with Dr. Lorenso Courier.  5) 05/30/2019: CT simulation. Received Radiation to brain/spine.  5) 06/23/2019: Received Cycle 1 Day 1 of Carbo/Pemetrexed while inpatient. Pembrolizumab not able to be administered in inpatient setting. 6) 07/13/2019:Cycle 2 Day 1 of Carboplatin/Pemetrexed/Pembrolizumab 7)  08/04/2019:Cycle 3 day 1 of Carboplatin/Pemetrexed/Pembrolizumab  Interval History:  Shameer MATISSE SALAIS 34 y.o. male with medical history significant for metastatic adenocarcinoma of the lung who presents for an urgent follow up visit for facial swelling.  The patient's last visit was on 08/04/2019 at which time he was evaluated prior to Cycle 3.   On exam today Mr. Matusek notes he is having new and worsening swelling around his face and neck.  He reports this has been going on for a little over a week.   Swelling is predominantly on the sides of his face as well as he is having some swelling of his eyelids as well.  He notes that he is not having much in the way of swelling of his arms, but is having a little more abdominal distention but no lower extremity swelling.  He also notes that he tolerated his last cycle of chemotherapy quite well with no clear side effects from his medication.    He does continue to have a tremor with some seizures and jerks during sleep.  He also endorses a new problem with left leg discomfort that began after cycle three of chemotherapy.  He notes it is more of a superficial muscle pain located on the lateral aspect of his left thigh.  He has been using his fentanyl patches as prescribed and has very rarely required the use of his Dilaudid medication.  On exam today he otherwise denies having issues with fevers, chills, sweats, nausea, vomiting or diarrhea.  He denies having any chest pain or shortness of breath.  A full 10 point ROS is listed below.  MEDICAL HISTORY:  Past Medical History:  Diagnosis Date  . Back pain   . Cancer ([place])   . Hypertension     SURGICAL HISTORY: Past Surgical History:  Procedure Laterality Date  . IR IMAGING GUIDED PORT INSERTION  06/12/2019  . KNEE SURGERY      SOCIAL HISTORY: Social History   Socioeconomic History  . Marital status: Married    Spouse name: Not on file  . Number of children: Not on file  . Years of education: Not on file  . Highest education level: Not on file  Occupational History  . Not on file  Tobacco  Use  . Smoking status: Never Smoker  . Smokeless tobacco: Never Used  Vaping Use  . Vaping Use: Never used  Substance and Sexual Activity  . Alcohol use: No  . Drug use: No  . Sexual activity: Yes  Other Topics Concern  . Not on file  Social History Narrative  . Not on file   Social Determinants of Health   Financial Resource Strain:   . Difficulty of Paying Living Expenses:   Food Insecurity:    . Worried About Charity fundraiser in the Last Year:   . Arboriculturist in the Last Year:   Transportation Needs:   . Film/video editor (Medical):   Marland Kitchen Lack of Transportation (Non-Medical):   Physical Activity:   . Days of Exercise per Week:   . Minutes of Exercise per Session:   Stress:   . Feeling of Stress :   Social Connections:   . Frequency of Communication with Friends and Family:   . Frequency of Social Gatherings with Friends and Family:   . Attends Religious Services:   . Active Member of Clubs or Organizations:   . Attends Archivist Meetings:   Marland Kitchen Marital Status:   Intimate Partner Violence:   . Fear of Current or Ex-Partner:   . Emotionally Abused:   Marland Kitchen Physically Abused:   . Sexually Abused:     FAMILY HISTORY: Family History  Problem Relation Age of Onset  . Hypertension Mother   . Diabetes Father     ALLERGIES:  is allergic to onion, other, peanut oil, and peanut-containing drug products.  MEDICATIONS:  Current Outpatient Medications  Medication Sig Dispense Refill  . fentaNYL (DURAGESIC) 75 MCG/HR Place 1 patch onto the skin every 3 (three) days.    Marland Kitchen albuterol (VENTOLIN HFA) 108 (90 Base) MCG/ACT inhaler TAKE 2 PUFFS BY MOUTH EVERY 6 HOURS AS NEEDED FOR WHEEZE OR SHORTNESS OF BREATH 6.7 g 1  . alum & mag hydroxide-simeth (MAALOX MULTI SYMPTOM MAX ST) 400-400-40 MG/5ML suspension Take 10 mLs by mouth every 6 (six) hours as needed for indigestion. 355 mL 2  . amphetamine-dextroamphetamine (ADDERALL) 10 MG tablet Take 10 mg by mouth every morning, may take 17m additional dose daily as needed for fatigue 60 tablet 0  . celecoxib (CELEBREX) 200 MG capsule Take 1 capsule (200 mg total) by mouth daily. (Patient not taking: Reported on 07/24/2019) 30 capsule 0  . dexamethasone (DECADRON) 4 MG tablet Take 1 tablet (4 mg total) by mouth 2 (two) times daily with a meal. Starting on 5/11, and drop dose to once daily on 5/14 60 tablet 0  . DULoxetine  (CYMBALTA) 30 MG capsule Take 1 capsule (30 mg total) by mouth daily. 30 capsule 3  . folic acid (FOLVITE) 1 MG tablet Take 1 tablet (1 mg total) by mouth daily. 60 tablet 3  . HYDROmorphone (DILAUDID) 8 MG tablet Take 1/2- 1 tablet every 4 hours as needed for pain 90 tablet 0  . lidocaine-prilocaine (EMLA) cream Apply 1 application topically as needed. 30 g 2  . metoprolol succinate (TOPROL XL) 50 MG 24 hr tablet Take 1 tablet (50 mg total) by mouth daily. Take with or immediately following a meal. 30 tablet 11  . metoprolol tartrate (LOPRESSOR) 25 MG tablet Take 1 tablet (25 mg total) by mouth 2 (two) times daily. 60 tablet 0  . nystatin (MYCOSTATIN) 100000 UNIT/ML suspension Take 5 mLs (500,000 Units total) by mouth 4 (four) times daily.  60 mL 0  . ondansetron (ZOFRAN) 4 MG tablet Take 1 tablet (4 mg total) by mouth every 8 (eight) hours as needed for nausea or vomiting. 60 tablet 0  . pantoprazole (PROTONIX) 40 MG tablet Take 1 tablet (40 mg total) by mouth 2 (two) times daily. 60 tablet 0  . polyethylene glycol (MIRALAX / GLYCOLAX) 17 g packet Take 17 g by mouth daily. 14 each 0  . rivaroxaban (XARELTO) 20 MG TABS tablet Take 1 tablet (20 mg total) by mouth daily with supper. 30 tablet 1  . senna-docusate (SENOKOT-S) 8.6-50 MG tablet Take 2 tablets by mouth 2 (two) times daily. 120 tablet 0   No current facility-administered medications for this visit.    REVIEW OF SYSTEMS:   Constitutional: ( - ) fevers, ( - )  chills , ( - ) night sweats Eyes: ( - ) blurriness of vision, ( - ) double vision, ( - ) watery eyes Ears, nose, mouth, throat, and face: ( - ) mucositis, ( - ) sore throat Respiratory: ( - ) cough, ( - ) dyspnea, ( - ) wheezes Cardiovascular: ( - ) palpitation, ( - ) chest discomfort, ( - ) lower extremity swelling Gastrointestinal:  ( - ) nausea, ( - ) heartburn, ( - ) change in bowel habits Skin: ( - ) abnormal skin rashes Lymphatics: ( - ) new lymphadenopathy, ( - ) easy  bruising Neurological: ( - ) numbness, ( - ) tingling, ( - ) new weaknesses Behavioral/Psych: ( - ) mood change, ( - ) new changes  All other systems were reviewed with the patient and are negative.  PHYSICAL EXAMINATION: ECOG PERFORMANCE STATUS: 2 - Symptomatic, <50% confined to bed  Vitals:   08/16/19 1311  BP: 122/85  Pulse: 98  Resp: 14  Temp: 97.7 F (36.5 C)  SpO2: 100%   Filed Weights   08/16/19 1311  Weight: 201 lb 11.2 oz (91.5 kg)    GENERAL: well appearing young African American male in NAD  SKIN: skin color, texture, turgor are normal, no rashes or significant lesions EYES: conjunctiva are pink and non-injected, sclera clear LUNGS: clear to auscultation and percussion with normal breathing effort HEART: regular rate & rhythm and no murmurs and no lower extremity edema Musculoskeletal: no cyanosis of digits and no clubbing  PSYCH: alert & oriented x 3, fluent speech NEURO: no focal motor/sensory deficits  LABORATORY DATA:  I have reviewed the data as listed CBC Latest Ref Rng & Units 08/16/2019 08/04/2019 07/24/2019  WBC 4.0 - 10.5 K/uL 7.1 5.7 5.3  Hemoglobin 13.0 - 17.0 g/dL 9.9(L) 9.6(L) 9.2(L)  Hematocrit 39 - 52 % 31.5(L) 31.8(L) 29.5(L)  Platelets 150 - 400 K/uL 83(L) 150 61(L)    CMP Latest Ref Rng & Units 08/16/2019 08/04/2019 07/24/2019  Glucose 70 - 99 mg/dL 135(H) 176(H) 149(H)  BUN 6 - 20 mg/dL 27(H) 19 22(H)  Creatinine 0.61 - 1.24 mg/dL 1.19 1.15 1.05  Sodium 135 - 145 mmol/L 139 142 142  Potassium 3.5 - 5.1 mmol/L 4.6 4.3 4.2  Chloride 98 - 111 mmol/L 107 106 108  CO2 22 - 32 mmol/L 21(L) 22 21(L)  Calcium 8.9 - 10.3 mg/dL 9.2 8.9 8.4(L)  Total Protein 6.5 - 8.1 g/dL 7.0 7.0 6.7  Total Bilirubin 0.3 - 1.2 mg/dL 0.3 0.3 0.3  Alkaline Phos 38 - 126 U/L 446(H) 559(H) 439(H)  AST 15 - 41 U/L 48(H) 39 44(H)  ALT 0 - 44 U/L 136(H) 117(H) 152(H)  RADIOGRAPHIC STUDIES: No results found.  ASSESSMENT & PLAN Abelardo Isidoro Donning 34 y.o. male with  medical history significant for metastatic adenocarcinoma of the lung who presents for an urgent follow up visit for facial swelling.   The patient successfully completed Cycle 1 of carboplatin and pemetrexed, started on 06/23/2019.  Unfortunately this had to be administered in the inpatient setting because the patient was admitted for pain control.  As we were unsure when he would be discharged we decided to move forward with the treatment at that time.  Pembrolizumab was not able to be administered in house due to logistical and insurance issues.  He received pembrolizumab during Cycle 2 which started on 07/13/2019.   On exam today [place] notes that he is presenting due to worsening swelling in his face etiology of which is not clear.  This is been going on for several days.  His pain is currently under adequate control with the fentanyl patch and occasional use of as needed dilaudid.  He is not having edema elsewhere in his body or other concerning symptoms at this time.  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 This will give [place] therapeutic options later in his treatment course. Previously I made it clear to Mr. Dutton that all treatments for administering are palliative in nature and designed to shrink the tumor and reduce symptoms.  The goal is to extend life and provide him some quality of life.  Once again we reiterated that there is no cure for his disease and that all steps moving forward should be made with this in mind.  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 Pathology could not definitively say, but he had a prior nodule on CXR and mass in lung appears to be the primary lesion -- Guardant 360 with actionable EGFR mutation noted.  --started Carbo/Pem/Pem (regimen detailed above) Cycle  1 Day 1 on 06/23/2019. Cycle 1 administered inpatient, pembrolizumab was not able to be added at that time. Cycle 2 (07/13/2019) included pembroliuzmab, which will be included in all subsequent cycles.  --repeat CT scan due to be performed in August 2021, however due to new facial swelling will repeat this sooner than anticipated (within the next week).  --pain control regimen as listed below   --RTC on Day 1 Cycle 4 ( 08/24/2019)   #Facial Swelling, acute --unclear etiology, developed over the last several days --bilateral facial swelling, most pronounced in the neck bilaterally. Some lid edema and facial fullness with no marked increase in arm swelling --no worsening abdominal or LE swelling. No difficultly breathing or swallowing.  --concern for medication side effect vs SVC syndrome. Will move up date for response scans to this month to assure no compression of SVC   #Metastatic Lesions in the Brain --patient has had neurological symptoms including headache, left leg weakness, and left eye vision changes -- [name] and radiation oncology are following, Appreciate assistance in management of brain mets.  --pain control as noted below.  #Pain Control, improving --pain is predominately in the back and abdomen. Of note, headaches have resloved --patient is taking dexamethasone 61m PO daily  --currently on fentanyl patches 75 mcg q 72H, pain is improved with less side effects.  --dilaudid PO [id number] PRN (not used for last several weeks).  --additionally is taking cymablata 3047mO and celebrex  21m daily  --senna docusate/PRN miralax for opioid induced constipation  #Osseus Lesions/Hip Pain --dilaudid as above --recommend evaluation by radiation oncology  --encourage dental evaluation (has yet to be done) --continue IV zometa therapy for bone protection. Started 07/13/2019, to continue q 3 months. Next due Sept 2021.   #Left Eye Vision Changes --[facility]--possible direct involvement of globe vs occipital lobe lesion --currently using eye patch over left eye.  --continue to monitor   # Weight Loss, stable --encourage high calorie diet --possible increase appetite and weight from steroid therapy --continue to monitor  #Pulmonary Embolism --continue Xarelto 252mPO daily   Orders Placed This Encounter  Procedures  . CT Chest W Contrast    Standing Status:   Future    Standing Expiration Date:   08/15/2020    Order Specific Question:   If indicated for the ordered procedure, I authorize the administration of contrast media per Radiology protocol    Answer:   Yes    Order Specific Question:   [facility]    [facility]  Order Specific Question:   Radiology Contrast Protocol - do NOT remove file path    Answer:   \\[email or web address]  . CT Abdomen Pelvis W Contrast    Standing Status:   Future    Standing Expiration Date:   08/15/2020    Order Specific Question:   If indicated for the ordered procedure, I authorize the administration of contrast media per Radiology protocol    Answer:   Yes    Order Specific Question:   [facility]    [facility]  Order Specific Question:   Is Oral Contrast requested for this exam?    Answer:   Yes, Per Radiology protocol    Order Specific Question:   Radiology Contrast Protocol - do NOT remove file path    Answer:   \\[email or web address]   All questions were answered. The patient knows to call the clinic with any problems, questions or concerns.  A total of more than 30 minutes were spent on this encounter and over half of that time was spent on counseling and coordination of care as outlined above.   JoLedell PeoplesMD Department of Hematology/Oncology [facility]: [phone]:  [phone]: [email or web address]  08/16/2019 5:01 PM

## 2019-08-16 NOTE — Telephone Encounter (Signed)
Yes can schedule at either location

## 2019-08-16 NOTE — Telephone Encounter (Signed)
TCT [facility] regarding scheduling of Brain and cervical spine MRI. They are unable to schedule both on the same day but can do 2 weeks apart.  Please advise if can be scheduled @ [facility] or [facility]

## 2019-08-17 ENCOUNTER — Other Ambulatory Visit: Payer: Self-pay | Admitting: *Deleted

## 2019-08-17 DIAGNOSIS — C7949 Secondary malignant neoplasm of other parts of nervous system: Secondary | ICD-10-CM

## 2019-08-17 LAB — T4: T4, Total: 7.1 ug/dL (ref 4.5–12.0)

## 2019-08-21 ENCOUNTER — Other Ambulatory Visit: Payer: Self-pay | Admitting: Internal Medicine

## 2019-08-21 ENCOUNTER — Other Ambulatory Visit: Payer: Self-pay | Admitting: *Deleted

## 2019-08-21 DIAGNOSIS — C7949 Secondary malignant neoplasm of other parts of nervous system: Secondary | ICD-10-CM

## 2019-08-22 ENCOUNTER — Telehealth: Payer: Self-pay

## 2019-08-22 MED ORDER — ONDANSETRON HCL 4 MG PO TABS: 4.0000 mg | ORAL_TABLET | Freq: Three times a day (TID) | ORAL | 0 refills | Status: DC | PRN

## 2019-08-22 MED ORDER — DEXAMETHASONE 4 MG PO TABS: 4.0000 mg | ORAL_TABLET | Freq: Two times a day (BID) | ORAL | 0 refills | Status: DC

## 2019-08-22 MED FILL — ONDANSETRON HCL 4 MG TABS: 4 | 20 days supply | Qty: 60 | Fill #0

## 2019-08-22 NOTE — Telephone Encounter (Signed)
Refills sent

## 2019-08-23 ENCOUNTER — Ambulatory Visit: Payer: PRIVATE HEALTH INSURANCE | Attending: Critical Care Medicine | Admitting: Critical Care Medicine

## 2019-08-23 ENCOUNTER — Other Ambulatory Visit: Payer: Self-pay | Admitting: Hematology and Oncology

## 2019-08-23 ENCOUNTER — Other Ambulatory Visit: Payer: Self-pay

## 2019-08-23 ENCOUNTER — Encounter: Payer: Self-pay | Admitting: Critical Care Medicine

## 2019-08-23 ENCOUNTER — Telehealth: Payer: Self-pay

## 2019-08-23 VITALS — BP 119/82 | HR 100 | Temp 97.5°F | Resp 17 | Ht 68.0 in | Wt 205.0 lb

## 2019-08-23 DIAGNOSIS — I2694 Multiple subsegmental pulmonary emboli without acute cor pulmonale: Secondary | ICD-10-CM | POA: Diagnosis not present

## 2019-08-23 DIAGNOSIS — I82509 Chronic embolism and thrombosis of unspecified deep veins of unspecified lower extremity: Secondary | ICD-10-CM | POA: Diagnosis not present

## 2019-08-23 DIAGNOSIS — Z1159 Encounter for screening for other viral diseases: Secondary | ICD-10-CM

## 2019-08-23 DIAGNOSIS — C3490 Malignant neoplasm of unspecified part of unspecified bronchus or lung: Secondary | ICD-10-CM

## 2019-08-23 DIAGNOSIS — C3491 Malignant neoplasm of unspecified part of right bronchus or lung: Secondary | ICD-10-CM

## 2019-08-23 DIAGNOSIS — Z7189 Other specified counseling: Secondary | ICD-10-CM

## 2019-08-23 DIAGNOSIS — I10 Essential (primary) hypertension: Secondary | ICD-10-CM | POA: Diagnosis not present

## 2019-08-23 DIAGNOSIS — C7931 Secondary malignant neoplasm of brain: Secondary | ICD-10-CM

## 2019-08-23 DIAGNOSIS — K219 Gastro-esophageal reflux disease without esophagitis: Secondary | ICD-10-CM | POA: Diagnosis not present

## 2019-08-23 DIAGNOSIS — Z515 Encounter for palliative care: Secondary | ICD-10-CM

## 2019-08-23 DIAGNOSIS — C7949 Secondary malignant neoplasm of other parts of nervous system: Secondary | ICD-10-CM

## 2019-08-23 DIAGNOSIS — Z7689 Persons encountering health services in other specified circumstances: Secondary | ICD-10-CM

## 2019-08-23 DIAGNOSIS — G893 Neoplasm related pain (acute) (chronic): Secondary | ICD-10-CM

## 2019-08-23 MED ORDER — METOPROLOL SUCCINATE ER 50 MG PO TB24: 50.0000 mg | ORAL_TABLET | Freq: Every day | ORAL | 11 refills | Status: AC

## 2019-08-23 MED ORDER — FENTANYL 75 MCG/HR TD PT72: 1.0000 | MEDICATED_PATCH | TRANSDERMAL | 0 refills | Status: DC

## 2019-08-23 MED ORDER — PANTOPRAZOLE SODIUM 40 MG PO TBEC: 40.0000 mg | DELAYED_RELEASE_TABLET | Freq: Two times a day (BID) | ORAL | 0 refills | Status: DC

## 2019-08-23 MED ORDER — DULOXETINE HCL 30 MG PO CPEP: 30.0000 mg | ORAL_CAPSULE | Freq: Every day | ORAL | 3 refills | Status: DC

## 2019-08-23 MED ORDER — HYDROMORPHONE HCL 8 MG PO TABS: ORAL_TABLET | ORAL | 0 refills | Status: DC

## 2019-08-23 MED FILL — fentaNYL 75 MCG/HR PT72: 75 | 15 days supply | Qty: 5 | Fill #0

## 2019-08-23 MED FILL — PANTOPRAZOLE SOD DR 40 MG T: 40 | 30 days supply | Qty: 60 | Fill #0

## 2019-08-23 MED FILL — METOPROLOL SUCCINATE ER 50: 50 | 30 days supply | Qty: 30 | Fill #2

## 2019-08-23 MED FILL — DEXAMETHASONE 4 MG TABLET: 4 | 30 days supply | Qty: 60 | Fill #0

## 2019-08-23 NOTE — Patient Instructions (Signed)
All of your medications will be sent to [facility] going forward. I will ask for patient assistance on your current expensive medications  Return to see Dr Joya Gaskins with video visit in two weeks

## 2019-08-23 NOTE — Assessment & Plan Note (Signed)
As per pulmonary embolism assessment

## 2019-08-23 NOTE — Assessment & Plan Note (Signed)
Reflux disease will continue Protonix twice daily

## 2019-08-23 NOTE — Assessment & Plan Note (Signed)
Palliative care encounter at this visit  My goal to support the oncologist with the patient's care and ensure refills on pain medications and other symptom management

## 2019-08-23 NOTE — Assessment & Plan Note (Signed)
Spinal canal metastases status post spine radiation

## 2019-08-23 NOTE — Assessment & Plan Note (Signed)
Primary carcinoma of the lung adeno poorly differentiated with multiple metastases  Care per oncology

## 2019-08-23 NOTE — Progress Notes (Signed)
Subjective:    Patient ID: William Ali, male    DOB: 04-05-1985, 34 y.o.   MRN: [id number]  This is a pleasant 34 year old male who comes to see me to establish for primary care and he is accompanied with his spouse.  This patient unfortunately developed a nodular density in March 2020 and another x-ray obtained in April 2020 recommending follow-up imaging with a nodule seen in the lung.  Subsequent to this the patient presented to the emergency room in mid April with headaches and left pelvic pain ultimately found to have a cavitary lesion in the right lung apex with spiculated margins and additional sites of disease including lymph nodes of the chest and numerous liver mets numerous multiple lytic appearing lesions throughout the thoracic and lumbar spine as well as portions of the bony pelvis.  Patient also had MRI of the brain showing numerous enhancing parenchymal intracranial lesions likely metastases.  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  The patient is now undergoing treatment and outlined below is the patient's treatment summary per oncology   #MetastaticAdenocarcinoma of the Lungwith Brain, [place],and Liver Metastasis 1)04/24/2018: Chest radiograph shows a 1.7 cm nodular density. Short term f/u of this lesion was recommended. CXR on 05/01/2018 performed with recommended f/u imaging. 2) 05/10/2019: presented to the ED with headache and left pelvis pain. Patient underwent CT C/A/P and MRI brain. Brain MRI showed numerous enhancing parenchymal intracranial lesions likely reflecting metastases. CT scan showedcavitary lesion in therightlung apex with spiculated margins. Additional sites of disease include lymph nodes of the chest and numerous liver mets. In the bones there weremultiple lytic appearing lesions throughout the thoracic and lumbar spine as well as portions of the bony pelvis. Referred to Oncology. 3) 05/16/2019: US  biopsy of the liver performed. Findings consistent with a poorly differentiated adenocarcinoma.  4) 05/19/2019: establish care with Dr. Lorenso Courier.  5) 05/30/2019: CT simulation. Received Radiation to brain/spine.  5) 06/23/2019: Received Cycle 1 Day 1 of Carbo/Pemetrexed while inpatient. Pembrolizumab not able to be administered in inpatient setting. 6) 07/13/2019:Cycle 2 Day 1 of Carboplatin/Pemetrexed/Pembrolizumab 7)  08/04/2019:Cycle 3 day 1 of Carboplatin/Pemetrexed/Pembrolizumab  Note this patient now is starting cycle 4 of his chemotherapy and has finished his brain and spine radiation therapy.  But the patient does have access to Dilaudid as needed for pain and now also has a fentanyl patch which is helped to some degree.  He states his breathing is at a stable situation for now.  His wife states he has some noises he makes when he breathes at night.  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  The patient is currently receiving his medications retail at the [facility] pain extremely high price.  We will attempt to get his Xarelto and fentanyl patches at least at a reduced amount at the [facility].  Repeat scans are due to be obtained in August as noted.  Patient has a physical therapist coming to his home as well.  Note patient is on Decadron and antiemetics as well.  The patient is a full code at this time and is not yet in any type of hospice care but is interested in appropriately receiving aggressive treatment for his condition though he does realize it is a palliative approach as per oncology's recent note Note there is a history of pulmonary embolism which is why he is on the Xarelto  due to hypercoagulable state.  Also note the patient is being managed by radiation therapy, medical oncology and neuro-oncology.  The patient's weight loss is improved and he is taking a nutritional supplements at this time.  . Past Medical  History:  Diagnosis Date  . Back pain   . Cancer ([place])   . Hypertension      Family History  Problem Relation Age of Onset  . Hypertension Mother   . Diabetes Father      Social History   Socioeconomic History  . Marital status: Married    Spouse name: Not on file  . Number of children: Not on file  . Years of education: Not on file  . Highest education level: Not on file  Occupational History  . Not on file  Tobacco Use  . Smoking status: Never Smoker  . Smokeless tobacco: Never Used  Vaping Use  . Vaping Use: Never used  Substance and Sexual Activity  . Alcohol use: No  . Drug use: No  . Sexual activity: Yes  Other Topics Concern  . Not on file  Social History Narrative  . Not on file   Social Determinants of Health   Financial Resource Strain:   . Difficulty of Paying Living Expenses:   Food Insecurity:   . Worried About Charity fundraiser in the Last Year:   . Arboriculturist in the Last Year:   Transportation Needs:   . Film/video editor (Medical):   Marland Kitchen Lack of Transportation (Non-Medical):   Physical Activity:   . Days of Exercise per Week:   . Minutes of Exercise per Session:   Stress:   . Feeling of Stress :   Social Connections:   . Frequency of Communication with Friends and Family:   . Frequency of Social Gatherings with Friends and Family:   . Attends Religious Services:   . Active Member of Clubs or Organizations:   . Attends Archivist Meetings:   Marland Kitchen Marital Status:   Intimate Partner Violence:   . Fear of Current or Ex-Partner:   . Emotionally Abused:   Marland Kitchen Physically Abused:   . Sexually Abused:      Allergies  Allergen Reactions  . Onion Anaphylaxis  . Other     All sea food causes swelling and a rash.    . Peanut Oil Itching  . Peanut-Containing Drug Products Itching     Outpatient Medications Prior to Visit  Medication Sig Dispense Refill  . albuterol (VENTOLIN HFA) 108 (90 Base) MCG/ACT inhaler TAKE 2 PUFFS  BY MOUTH EVERY 6 HOURS AS NEEDED FOR WHEEZE OR SHORTNESS OF BREATH 6.7 g 1  . alum & mag hydroxide-simeth (MAALOX MULTI SYMPTOM MAX ST) 400-400-40 MG/5ML suspension Take 10 mLs by mouth every 6 (six) hours as needed for indigestion. 355 mL 2  . dexamethasone (DECADRON) 4 MG tablet Take 1 tablet (4 mg total) by mouth 2 (two) times daily with a meal. Starting on 5/11, and drop dose to once daily on 5/14 60 tablet 0  . DULoxetine (CYMBALTA) 30 MG capsule Take 1 capsule (30 mg total) by mouth daily. 30 capsule 3  . fentaNYL (DURAGESIC) 75 MCG/HR Place 1 patch onto the skin every 3 (three) days.    . folic acid (FOLVITE) 1 MG tablet Take 1 tablet (1 mg total) by mouth daily. 60 tablet 3  . HYDROmorphone (DILAUDID) 8 MG tablet Take 1/2- 1 tablet every 4 hours as needed for pain  90 tablet 0  . lidocaine-prilocaine (EMLA) cream Apply 1 application topically as needed. 30 g 2  . metoprolol succinate (TOPROL XL) 50 MG 24 hr tablet Take 1 tablet (50 mg total) by mouth daily. Take with or immediately following a meal. 30 tablet 11  . ondansetron (ZOFRAN) 4 MG tablet Take 1 tablet (4 mg total) by mouth every 8 (eight) hours as needed for nausea or vomiting. 60 tablet 0  . pantoprazole (PROTONIX) 40 MG tablet Take 1 tablet (40 mg total) by mouth 2 (two) times daily. 60 tablet 0  . polyethylene glycol (MIRALAX / GLYCOLAX) 17 g packet Take 17 g by mouth daily. 14 each 0  . rivaroxaban (XARELTO) 20 MG TABS tablet Take 1 tablet (20 mg total) by mouth daily with supper. 30 tablet 1  . senna-docusate (SENOKOT-S) 8.6-50 MG tablet Take 2 tablets by mouth 2 (two) times daily. (Patient taking differently: Take 3 tablets by mouth at bedtime. ) 120 tablet 0  . amphetamine-dextroamphetamine (ADDERALL) 10 MG tablet Take 10 mg by mouth every morning, may take 28m additional dose daily as needed for fatigue (Patient not taking: Reported on 08/23/2019) 60 tablet 0  . celecoxib (CELEBREX) 200 MG capsule Take 1 capsule (200 mg total)  by mouth daily. (Patient not taking: Reported on 07/24/2019) 30 capsule 0  . hydrochlorothiazide (HYDRODIURIL) 25 MG tablet Take 25 mg by mouth daily. (Patient not taking: Reported on 08/23/2019)    . dexamethasone (DECADRON) 4 MG tablet Take 1 tablet (4 mg total) by mouth 2 (two) times daily with a meal. Starting on 5/11, and drop dose to once daily on 5/14 60 tablet 0  . metoprolol tartrate (LOPRESSOR) 25 MG tablet Take 1 tablet (25 mg total) by mouth 2 (two) times daily. 60 tablet 0  . nystatin (MYCOSTATIN) 100000 UNIT/ML suspension Take 5 mLs (500,000 Units total) by mouth 4 (four) times daily. 60 mL 0  . ondansetron (ZOFRAN) 4 MG tablet Take 1 tablet (4 mg total) by mouth every 8 (eight) hours as needed for nausea or vomiting. 60 tablet 0   No facility-administered medications prior to visit.     Review of Systems  Constitutional: Positive for activity change, appetite change, fatigue and unexpected weight change.  HENT: Negative for congestion, dental problem, drooling, ear discharge, facial swelling, hearing loss, nosebleeds, postnasal drip, rhinorrhea, sinus pressure, sinus pain, sore throat and voice change.   Eyes: Positive for visual disturbance.  Respiratory: Positive for cough and shortness of breath.   Cardiovascular: Positive for chest pain.  Gastrointestinal: Positive for constipation, nausea and vomiting.  Endocrine: Negative.   Genitourinary: Negative.   Musculoskeletal: Positive for back pain, joint swelling and neck pain.  Skin: Negative.   Neurological: Positive for dizziness, weakness and headaches. Negative for tremors, seizures, syncope, facial asymmetry, speech difficulty, light-headedness and numbness.  Hematological: Negative for adenopathy. Does not bruise/bleed easily.  Psychiatric/Behavioral: Positive for decreased concentration, dysphoric mood and sleep disturbance. Negative for behavioral problems, confusion, self-injury and suicidal ideas. The patient is  nervous/anxious.        Objective:   Physical Exam Vitals:   08/23/19 0916  BP: 119/82  Pulse: 100  Resp: 17  Temp: (!) 97.5 F (36.4 C)  TempSrc: Temporal  SpO2: 99%  Weight: (!) 205 lb (93 kg)  Height: 5' 8" (1.727 m)    Gen: Depressed and tearful, well-nourished, in no distress,    ENT: No lesions,  mouth clear,  oropharynx clear, no postnasal drip  Neck:  No JVD, no TMG, no carotid bruits  Lungs: No use of accessory muscles, no dullness to percussion, clear without rales or rhonchi  Cardiovascular: RRR, heart sounds normal, no murmur or gallops, no peripheral edema  Abdomen: soft and NT, no HSM,  BS normal  Musculoskeletal: No deformities, no cyanosis or clubbing  Neuro: alert, non focal  Skin: Warm, no lesions or rashes  BMP Latest Ref Rng & Units 08/16/2019 08/04/2019 07/24/2019  Glucose 70 - 99 mg/dL 135(H) 176(H) 149(H)  BUN 6 - 20 mg/dL 27(H) 19 22(H)  Creatinine 0.61 - 1.24 mg/dL 1.19 1.15 1.05  Sodium 135 - 145 mmol/L 139 142 142  Potassium 3.5 - 5.1 mmol/L 4.6 4.3 4.2  Chloride 98 - 111 mmol/L 107 106 108  CO2 22 - 32 mmol/L 21(L) 22 21(L)  Calcium 8.9 - 10.3 mg/dL 9.2 8.9 8.4(L)   Hepatic Function Latest Ref Rng & Units 08/16/2019 08/04/2019 07/24/2019  Total Protein 6.5 - 8.1 g/dL 7.0 7.0 6.7  Albumin 3.5 - 5.0 g/dL 3.2(L) 3.2(L) 3.2(L)  AST 15 - 41 U/L 48(H) 39 44(H)  ALT 0 - 44 U/L 136(H) 117(H) 152(H)  Alk Phosphatase 38 - 126 U/L 446(H) 559(H) 439(H)  Total Bilirubin 0.3 - 1.2 mg/dL 0.3 0.3 0.3  Bilirubin, Direct 0.0 - 0.2 mg/dL - - -   CBC Latest Ref Rng & Units 08/16/2019 08/04/2019 07/24/2019  WBC 4.0 - 10.5 K/uL 7.1 5.7 5.3  Hemoglobin 13.0 - 17.0 g/dL 9.9(L) 9.6(L) 9.2(L)  Hematocrit 39 - 52 % 31.5(L) 31.8(L) 29.5(L)  Platelets 150 - 400 K/uL 83(L) 150 61(L)   All images and [place] link were reviewed      Assessment & Plan:  I personally reviewed all images and lab data in the [facility] system as well as any outside material available during  this office visit and agree with the  radiology impressions.   Pulmonary embolism (HCC) History of pulmonary embolism and deep vein thrombosis due to hypercoagulable state secondary to metastatic adenocarcinoma of the lung  We will continue Xarelto 20 mg daily for life  DVT (deep venous thrombosis) (HCC) As per pulmonary embolism assessment  Essential hypertension Hypertension well controlled at this time on hydrochlorothiazide and metoprolol will continue same  Adenocarcinoma of lung ([place]) Primary carcinoma of the lung adeno poorly differentiated with multiple metastases  Care per oncology  Metastatic primary lung cancer ([place]) Metastatic primary lung cancer to and that is with bone liver and brain involvement  Need for chronic palliation  We will continue Decadron and obtain refills, will also obtain refills ofThe Xarelto and Dilaudid along with fentanyl  Gastroesophageal reflux disease without esophagitis Reflux disease will continue Protonix twice daily  Metastatic adenocarcinoma to brain [facility]) Metastatic adenocarcinoma to brain patient is continuing Decadron has completed his course of radiotherapy and is followed by neuro oncology  Goals of care, counseling/discussion The patient did have a goals of care discussion previously we had a brief one today he wishes to continue to be full code and to hold off on hospice care.  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

## 2019-08-23 NOTE — Progress Notes (Signed)
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 Short term f/u of this lesion was recommended. CXR on 05/01/2018 performed with recommended f/u imaging. 2) 05/10/2019: presented to the ED with headache and left pelvis pain. Patient underwent CT C/A/P and MRI brain. Brain MRI showed numerous enhancing parenchymal intracranial lesions likely reflecting metastases. CT scan showed cavitary lesion in the right lung apex with spiculated margins. Additional sites of disease include lymph nodes of the chest and numerous liver mets. In the bones there were multiple lytic appearing lesions throughout the thoracic and lumbar spine as well as portions of the bony pelvis. Referred to Oncology. 3) 05/16/2019: US biopsy of the liver performed. Findings consistent with a poorly differentiated adenocarcinoma.  4) 05/19/2019: establish care with Dr. Lorenso Courier.  5) 05/30/2019: CT simulation. Received Radiation to brain/spine.  5) 06/23/2019: Received Cycle 1 Day 1 of Carbo/Pemetrexed while inpatient. Pembrolizumab not able to be administered in inpatient setting. 6) 07/13/2019:Cycle 2 Day 1 of Carboplatin/Pemetrexed/Pembrolizumab 7)  08/04/2019:Cycle 3 day 1 of Carboplatin/Pemetrexed/Pembrolizumab 8) 08/24/2019: Cycle 4 day 1 of Carboplatin/Pemetrexed/Pembrolizumab  Interval History:  William Ali 34 y.o. male with medical history significant for metastatic adenocarcinoma of the lung who presents for a follow up visit.  The patient's last visit was on 08/16/2019 at which time he was evaluated for facial swelling in an urgent visit.   On exam today William Ali notes his facial spelt welling is approximately the same as it was during  his last visit.  His wife notes that she is concerned about the lumps that he is developing under his arms as well as a lump that he has in his groin.  He reports that his pain is currently elevated at a 7-10 in severity mostly in his abdomen but in his back and leg as well.  He notes that his left thigh is worse when lying down and he occasionally is to put a pillow between his legs in order to try to help with this.  Furthermore he reports that he is not having any issues with nausea, vomiting, or diarrhea.  He reports that his energy is "so-so".  And has been relatively low lately.  He notes that his appetite has been good and his weight is actually increased up to 207 pounds.  Otherwise he notes that he is not having any issues with fevers, chills, sweats, shortness of breath, or chest pain.  He notes that he is not having any other overt signs of bleeding while on Eliquis therapy.  Full 10 point ROS is listed below.  MEDICAL HISTORY:  Past Medical History:  Diagnosis Date  . Back pain   . Cancer ([place])   . Hypertension     SURGICAL HISTORY: Past Surgical History:  Procedure Laterality Date  . IR IMAGING GUIDED PORT INSERTION  06/12/2019  . KNEE SURGERY      SOCIAL HISTORY: Social History   Socioeconomic History  . Marital status: Married    Spouse name: Not on file  . Number of children: Not on file  . Years of education: Not on file  . Highest education level: Not on file  Occupational History  . Not on file  Tobacco Use  . Smoking status: Never Smoker  .  Smokeless tobacco: Never Used  Vaping Use  . Vaping Use: Never used  Substance and Sexual Activity  . Alcohol use: No  . Drug use: No  . Sexual activity: Yes  Other Topics Concern  . Not on file  Social History Narrative  . Not on file   Social Determinants of Health   Financial Resource Strain:   . Difficulty of Paying Living Expenses:   Food Insecurity:   . Worried About Charity fundraiser in the Last Year:    . Arboriculturist in the Last Year:   Transportation Needs:   . Film/video editor (Medical):   Marland Kitchen Lack of Transportation (Non-Medical):   Physical Activity:   . Days of Exercise per Week:   . Minutes of Exercise per Session:   Stress:   . Feeling of Stress :   Social Connections:   . Frequency of Communication with Friends and Family:   . Frequency of Social Gatherings with Friends and Family:   . Attends Religious Services:   . Active Member of Clubs or Organizations:   . Attends Archivist Meetings:   Marland Kitchen Marital Status:   Intimate Partner Violence:   . Fear of Current or Ex-Partner:   . Emotionally Abused:   Marland Kitchen Physically Abused:   . Sexually Abused:     FAMILY HISTORY: Family History  Problem Relation Age of Onset  . Hypertension Mother   . Diabetes Father     ALLERGIES:  is allergic to onion, other, peanut oil, and peanut-containing drug products.  MEDICATIONS:  Current Outpatient Medications  Medication Sig Dispense Refill  . albuterol (VENTOLIN HFA) 108 (90 Base) MCG/ACT inhaler TAKE 2 PUFFS BY MOUTH EVERY 6 HOURS AS NEEDED FOR WHEEZE OR SHORTNESS OF BREATH 6.7 g 1  . alum & mag hydroxide-simeth (MAALOX MULTI SYMPTOM MAX ST) 400-400-40 MG/5ML suspension Take 10 mLs by mouth every 6 (six) hours as needed for indigestion. 355 mL 2  . amphetamine-dextroamphetamine (ADDERALL) 10 MG tablet Take 10 mg by mouth every morning, may take 59m additional dose daily as needed for fatigue (Patient not taking: Reported on 08/23/2019) 60 tablet 0  . dexamethasone (DECADRON) 4 MG tablet Take 1 tablet (4 mg total) by mouth 2 (two) times daily with a meal. Starting on 5/11, and drop dose to once daily on 5/14 60 tablet 0  . DULoxetine (CYMBALTA) 30 MG capsule Take 1 capsule (30 mg total) by mouth daily. 30 capsule 3  . fentaNYL (DURAGESIC) 75 MCG/HR Place 1 patch onto the skin every 3 (three) days. 5 patch 0  . folic acid (FOLVITE) 1 MG tablet Take 1 tablet (1 mg total) by  mouth daily. 60 tablet 3  . HYDROmorphone (DILAUDID) 8 MG tablet Take 1/2- 1 tablet every 4 hours as needed for pain 90 tablet 0  . lidocaine-prilocaine (EMLA) cream Apply 1 application topically as needed. 30 g 2  . metoprolol succinate (TOPROL XL) 50 MG 24 hr tablet Take 1 tablet (50 mg total) by mouth daily. Take with or immediately following a meal. 30 tablet 11  . ondansetron (ZOFRAN) 4 MG tablet Take 1 tablet (4 mg total) by mouth every 8 (eight) hours as needed for nausea or vomiting. 60 tablet 0  . pantoprazole (PROTONIX) 40 MG tablet Take 1 tablet (40 mg total) by mouth 2 (two) times daily. 60 tablet 0  . polyethylene glycol (MIRALAX / GLYCOLAX) 17 g packet Take 17 g by mouth daily. 14 each 0  .  rivaroxaban (XARELTO) 20 MG TABS tablet Take 1 tablet (20 mg total) by mouth daily with supper. 30 tablet 1  . senna-docusate (SENOKOT-S) 8.6-50 MG tablet Take 2 tablets by mouth 2 (two) times daily. (Patient taking differently: Take 3 tablets by mouth at bedtime. ) 120 tablet 0   No current facility-administered medications for this visit.   Facility-Administered Medications Ordered in Other Visits  Medication Dose Route Frequency Provider Last Rate Last Admin  . sodium chloride flush (NS) 0.9 % injection 10 mL  10 mL Intracatheter PRN Orson Slick, MD   10 mL at 08/24/19 1545    REVIEW OF SYSTEMS:   Constitutional: ( - ) fevers, ( - )  chills , ( - ) night sweats Eyes: ( - ) blurriness of vision, ( - ) double vision, ( - ) watery eyes Ears, nose, mouth, throat, and face: ( - ) mucositis, ( - ) sore throat Respiratory: ( - ) cough, ( - ) dyspnea, ( - ) wheezes Cardiovascular: ( - ) palpitation, ( - ) chest discomfort, ( - ) lower extremity swelling Gastrointestinal:  ( - ) nausea, ( - ) heartburn, ( - ) change in bowel habits Skin: ( - ) abnormal skin rashes Lymphatics: ( - ) new lymphadenopathy, ( - ) easy bruising Neurological: ( - ) numbness, ( - ) tingling, ( - ) new  weaknesses Behavioral/Psych: ( - ) mood change, ( - ) new changes  All other systems were reviewed with the patient and are negative.  PHYSICAL EXAMINATION: ECOG PERFORMANCE STATUS: 2 - Symptomatic, <50% confined to bed  Vitals:   08/24/19 1133  BP: (!) 129/87  Pulse: 102  Resp: 20  Temp: 97.8 F (36.6 C)  SpO2: 100%   Filed Weights   08/24/19 1133  Weight: (!) 207 lb 11.2 oz (94.2 kg)    GENERAL: well appearing young African American male in NAD  SKIN: skin color, texture, turgor are normal, no rashes or significant lesions NECK: supple, nontender LYMPH: fullness of the cervical lymph nodes bilaterally. No clear axillary lymph nodes EYES: conjunctiva are pink and non-injected, sclera clear LUNGS: clear to auscultation and percussion with normal breathing effort HEART: regular rate & rhythm and no murmurs and no lower extremity edema Musculoskeletal: no cyanosis of digits and no clubbing. Increased swelling inferior to axilla bilaterally. Also noted to have soft tissue swelling in left leg  PSYCH: alert & oriented x 3, fluent speech NEURO: no focal motor/sensory deficits  LABORATORY DATA:  I have reviewed the data as listed CBC Latest Ref Rng & Units 08/24/2019 08/16/2019 08/04/2019  WBC 4.0 - 10.5 K/uL 3.8(L) 7.1 5.7  Hemoglobin 13.0 - 17.0 g/dL 9.7(L) 9.9(L) 9.6(L)  Hematocrit 39 - 52 % 31.6(L) 31.5(L) 31.8(L)  Platelets 150 - 400 K/uL 100(L) 83(L) 150    CMP Latest Ref Rng & Units 08/24/2019 08/16/2019 08/04/2019  Glucose 70 - 99 mg/dL 152(H) 135(H) 176(H)  BUN 6 - 20 mg/dL 22(H) 27(H) 19  Creatinine 0.61 - 1.24 mg/dL 1.24 1.19 1.15  Sodium 135 - 145 mmol/L 142 139 142  Potassium 3.5 - 5.1 mmol/L 4.2 4.6 4.3  Chloride 98 - 111 mmol/L 109 107 106  CO2 22 - 32 mmol/L 23 21(L) 22  Calcium 8.9 - 10.3 mg/dL 9.8 9.2 8.9  Total Protein 6.5 - 8.1 g/dL 7.0 7.0 7.0  Total Bilirubin 0.3 - 1.2 mg/dL 0.3 0.3 0.3  Alkaline Phos 38 - 126 U/L 505(H) 446(H) 559(H)  AST  15 - 41 U/L  51(H) 48(H) 39  ALT 0 - 44 U/L 165(H) 136(H) 117(H)    RADIOGRAPHIC STUDIES: No results found.  ASSESSMENT & PLAN Manson Isidoro Ali 34 y.o. male with medical history significant for metastatic adenocarcinoma of the lung who presents for an urgent follow up visit for facial swelling.   The patient successfully completed Cycle 1 of carboplatin and pemetrexed, started on 06/23/2019.  Unfortunately this had to be administered in the inpatient setting because the patient was admitted for pain control.  As we were unsure when he would be discharged we decided to move forward with the treatment at that time.  Pembrolizumab was not able to be administered in house due to logistical and insurance issues.  He received pembrolizumab during Cycle 2 which started on 07/13/2019.   On exam today William Ali notes his facial swelling is stable from our last visit.  Additionally he is continuing to have abdominal pain as well as back and leg pain.  These findings are stable compared to prior, however on physical exam of his neck today there was more palpable lymphadenopathy in the cervical lymph nodes bilaterally.  The collections under his arms did not feel like lymph nodes, nor did the soft tissue lesion on his left thigh.  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 This will give [place] therapeutic options later in his treatment course. Previously I made it clear to William Ali that all treatments for administering are palliative in nature and designed to shrink the tumor and reduce symptoms.  The goal is to extend life and provide him some quality of life.  Once again we reiterated that there is no cure for his disease and that all steps moving forward should be made with this in mind.  # Metastatic Adenocarcinoma of  the Lung with Brain, Osseus, and Liver Metastasis --patient has extensive metastatic spread of poorly differentiated adenocarcinoma, most likely lung in origin. Pathology could not definitively say, but he had a prior nodule on CXR and mass in lung appears to be the primary lesion -- Guardant 360 with actionable EGFR mutation noted.  --started Carbo/Pem/Pem (regimen detailed above) Cycle 1 Day 1 on 06/23/2019. Cycle 1 administered inpatient, pembrolizumab was not able to be added at that time. Cycle 2 (07/13/2019) included pembroliuzmab, which will be included in all subsequent cycles.  --repeat CT scan due to be performed in August 2021, however due to new facial swelling will repeat this sooner than anticipated (within the next week).  --pain control regimen as listed below   --RTC 1 week prior to Day 1 Cycle 5 ( 09/07/2019)   #Facial Swelling #Cervical Fullness/Lymphadenopathy --bilateral facial swelling, most pronounced in the neck bilaterally. Some lid edema and facial fullness with no marked increase in arm swelling --no worsening abdominal or LE swelling. No difficultly breathing or swallowing.  --on exam today the cervical regions bilaterally felt more full, likely represents lymphadenopathy. Will add  CT neck to his upcoming scan.   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 and radiation oncology are following, Appreciate assistance in management of brain mets.  --pain control as noted below.  #Pain Control, improving --pain is  predominately in the back and abdomen. Of note, headaches have resloved --patient is taking dexamethasone 10m PO daily  --currently on fentanyl patches 75 mcg q 72H, pain is improved with less side effects.  --dilaudid PO 4-88mq4H PRN (not used for last several weeks).  --additionally is taking cymablata 3067mO and celebrex 200m31mily  --senna  docusate/PRN miralax for opioid induced constipation  #Osseus Lesions/Hip Pain --dilaudid as above --recommend evaluation by radiation oncology  --encourage dental evaluation (has yet to be done) --continue IV zometa therapy for bone protection. Started 07/13/2019, to continue q 3 months. Next due Sept 2021.   #Left Eye Vision Changes --[facility] vs [facility]ipital [facility] --currently using eye patch over left eye.  --continue to monitor   # Weight Loss, improved --encourage high calorie diet --possible increase appetite and weight from steroid therapy --continue to monitor  #Pulmonary Embolism --continue Xarelto 20mg68mdaily   Orders Placed This Encounter  Procedures  . CT Soft Tissue Neck W Contrast    Standing Status:   Future    Standing Expiration Date:   08/23/2020    Order Specific Question:   If indicated for the ordered procedure, I authorize the administration of contrast media per Radiology protocol    Answer:   Yes    Order Specific Question:   [facility]?    [facility]:   Radiology Contrast Protocol - do NOT remove file path    Answer:   \\[email or web address]   All questions were answered. The patient knows to call the clinic with any problems, questions or concerns.  A total of more than 30 minutes were spent on this encounter and over half of that time was spent on counseling and coordination of care as outlined above.   William Ali Ledell PeoplesDepartment of Hematology/Oncology [facility]orporatede: [phone]: [phone]: [email or web address]  08/24/2019 7:30 PM

## 2019-08-23 NOTE — Telephone Encounter (Signed)
I MAILED AN APPLICATION TO PATIENT TODAY FOR [facility](XARELTO).

## 2019-08-23 NOTE — Assessment & Plan Note (Signed)
The patient did have a goals of care discussion previously we had a brief one today he wishes to continue to be full code and to hold off on hospice care.  I did explain to the receiving palliative care and hospice care and told him that is an option and he wishes to hold off on palliative care for now

## 2019-08-23 NOTE — Assessment & Plan Note (Signed)
Metastatic primary lung cancer to and that is with bone liver and brain involvement  Need for chronic palliation  We will continue Decadron and obtain refills, will also obtain refills ofThe Xarelto and Dilaudid along with fentanyl

## 2019-08-23 NOTE — Assessment & Plan Note (Signed)
Metastatic adenocarcinoma to brain patient is continuing Decadron has completed his course of radiotherapy and is followed by neuro oncology

## 2019-08-23 NOTE — Assessment & Plan Note (Signed)
History of pulmonary embolism and deep vein thrombosis due to hypercoagulable state secondary to metastatic adenocarcinoma of the lung  We will continue Xarelto 20 mg daily for life

## 2019-08-23 NOTE — Assessment & Plan Note (Signed)
Hypertension well controlled at this time on hydrochlorothiazide and metoprolol will continue same

## 2019-08-24 ENCOUNTER — Other Ambulatory Visit: Payer: Self-pay

## 2019-08-24 ENCOUNTER — Inpatient Hospital Stay (HOSPITAL_BASED_OUTPATIENT_CLINIC_OR_DEPARTMENT_OTHER): Payer: PRIVATE HEALTH INSURANCE | Admitting: Hematology and Oncology

## 2019-08-24 ENCOUNTER — Inpatient Hospital Stay: Payer: PRIVATE HEALTH INSURANCE

## 2019-08-24 ENCOUNTER — Other Ambulatory Visit: Payer: Self-pay | Admitting: *Deleted

## 2019-08-24 ENCOUNTER — Encounter: Payer: Self-pay | Admitting: *Deleted

## 2019-08-24 ENCOUNTER — Encounter: Payer: Self-pay | Admitting: Hematology and Oncology

## 2019-08-24 VITALS — BP 129/87 | HR 102 | Temp 97.8°F | Resp 20 | Ht 68.0 in | Wt 207.7 lb

## 2019-08-24 VITALS — HR 99

## 2019-08-24 DIAGNOSIS — C3491 Malignant neoplasm of unspecified part of right bronchus or lung: Secondary | ICD-10-CM

## 2019-08-24 DIAGNOSIS — C7931 Secondary malignant neoplasm of brain: Secondary | ICD-10-CM

## 2019-08-24 DIAGNOSIS — Z5112 Encounter for antineoplastic immunotherapy: Secondary | ICD-10-CM

## 2019-08-24 DIAGNOSIS — C7949 Secondary malignant neoplasm of other parts of nervous system: Secondary | ICD-10-CM

## 2019-08-24 DIAGNOSIS — Z5111 Encounter for antineoplastic chemotherapy: Secondary | ICD-10-CM | POA: Diagnosis not present

## 2019-08-24 LAB — CBC WITH DIFFERENTIAL (CANCER CENTER ONLY)
Abs Immature Granulocytes: 0.07 10*3/uL (ref 0.00–0.07)
Basophils Absolute: 0 10*3/uL (ref 0.0–0.1)
Basophils Relative: 0 %
Eosinophils Absolute: 0 10*3/uL (ref 0.0–0.5)
Eosinophils Relative: 0 %
HCT: 31.6 % — ABNORMAL LOW (ref 39.0–52.0)
Hemoglobin: 9.7 g/dL — ABNORMAL LOW (ref 13.0–17.0)
Immature Granulocytes: 2 %
Lymphocytes Relative: 5 %
Lymphs Abs: 0.2 10*3/uL — ABNORMAL LOW (ref 0.7–4.0)
MCH: 29.1 pg (ref 26.0–34.0)
MCHC: 30.7 g/dL (ref 30.0–36.0)
MCV: 94.9 fL (ref 80.0–100.0)
Monocytes Absolute: 0.7 10*3/uL (ref 0.1–1.0)
Monocytes Relative: 17 %
Neutro Abs: 2.9 10*3/uL (ref 1.7–7.7)
Neutrophils Relative %: 76 %
Platelet Count: 100 10*3/uL — ABNORMAL LOW (ref 150–400)
RBC: 3.33 MIL/uL — ABNORMAL LOW (ref 4.22–5.81)
RDW: 20.7 % — ABNORMAL HIGH (ref 11.5–15.5)
WBC Count: 3.8 10*3/uL — ABNORMAL LOW (ref 4.0–10.5)
nRBC: 2.6 % — ABNORMAL HIGH (ref 0.0–0.2)

## 2019-08-24 LAB — CMP (CANCER CENTER ONLY)
ALT: 165 U/L — ABNORMAL HIGH (ref 0–44)
AST: 51 U/L — ABNORMAL HIGH (ref 15–41)
Albumin: 3.3 g/dL — ABNORMAL LOW (ref 3.5–5.0)
Alkaline Phosphatase: 505 U/L — ABNORMAL HIGH (ref 38–126)
Anion gap: 10 (ref 5–15)
BUN: 22 mg/dL — ABNORMAL HIGH (ref 6–20)
CO2: 23 mmol/L (ref 22–32)
Calcium: 9.8 mg/dL (ref 8.9–10.3)
Chloride: 109 mmol/L (ref 98–111)
Creatinine: 1.24 mg/dL (ref 0.61–1.24)
GFR, Est AFR Am: >60 mL/min (ref >60)
GFR, Estimated: >60 mL/min (ref >60)
Glucose, Bld: 152 mg/dL — ABNORMAL HIGH (ref 70–99)
Potassium: 4.2 mmol/L (ref 3.5–5.1)
Sodium: 142 mmol/L (ref 135–145)
Total Bilirubin: 0.3 mg/dL (ref 0.3–1.2)
Total Protein: 7 g/dL (ref 6.5–8.1)

## 2019-08-24 LAB — GUARDANT 360

## 2019-08-24 LAB — TSH: TSH: 0.506 u[IU]/mL (ref 0.320–4.118)

## 2019-08-24 MED ORDER — CYANOCOBALAMIN 1000 MCG/ML IJ SOLN
INTRAMUSCULAR | Status: AC
  Filled 2019-08-24: qty 1

## 2019-08-24 MED ORDER — HYDROMORPHONE HCL 1 MG/ML IJ SOLN
2.0000 mg | Freq: Once | INTRAMUSCULAR | Status: AC
  Administered 2019-08-24: 2 mg via INTRAVENOUS

## 2019-08-24 MED ORDER — SODIUM CHLORIDE 0.9 % IV SOLN
Freq: Once | INTRAVENOUS | Status: AC
  Filled 2019-08-24: qty 250

## 2019-08-24 MED ORDER — SODIUM CHLORIDE 0.9 % IV SOLN
150.0000 mg | Freq: Once | INTRAVENOUS | Status: AC
  Administered 2019-08-24: 150 mg via INTRAVENOUS
  Filled 2019-08-24: qty 150

## 2019-08-24 MED ORDER — PALONOSETRON HCL INJECTION 0.25 MG/5ML
INTRAVENOUS | Status: AC
  Filled 2019-08-24: qty 5

## 2019-08-24 MED ORDER — PALONOSETRON HCL INJECTION 0.25 MG/5ML
0.2500 mg | Freq: Once | INTRAVENOUS | Status: AC
  Administered 2019-08-24: 0.25 mg via INTRAVENOUS

## 2019-08-24 MED ORDER — HEPARIN SOD (PORK) LOCK FLUSH 100 UNIT/ML IV SOLN
500.0000 [IU] | Freq: Once | INTRAVENOUS | Status: AC | PRN
  Administered 2019-08-24: 500 [IU]
  Filled 2019-08-24: qty 5

## 2019-08-24 MED ORDER — HYDROMORPHONE HCL 1 MG/ML IJ SOLN
INTRAMUSCULAR | Status: AC
  Filled 2019-08-24: qty 2

## 2019-08-24 MED ORDER — CYANOCOBALAMIN 1000 MCG/ML IJ SOLN
1000.0000 ug | Freq: Once | INTRAMUSCULAR | Status: AC
  Administered 2019-08-24: 1000 ug via INTRAMUSCULAR

## 2019-08-24 MED ORDER — SODIUM CHLORIDE 0.9% FLUSH
10.0000 mL | INTRAVENOUS | Status: DC | PRN
  Administered 2019-08-24: 10 mL
  Filled 2019-08-24: qty 10

## 2019-08-24 MED ORDER — SODIUM CHLORIDE 0.9 % IV SOLN
710.0000 mg | Freq: Once | INTRAVENOUS | Status: AC
  Administered 2019-08-24: 710 mg via INTRAVENOUS
  Filled 2019-08-24: qty 71

## 2019-08-24 MED ORDER — SODIUM CHLORIDE 0.9 % IV SOLN
200.0000 mg | Freq: Once | INTRAVENOUS | Status: AC
  Administered 2019-08-24: 200 mg via INTRAVENOUS
  Filled 2019-08-24: qty 8

## 2019-08-24 MED ORDER — SODIUM CHLORIDE 0.9 % IV SOLN
10.0000 mg | Freq: Once | INTRAVENOUS | Status: AC
  Administered 2019-08-24: 10 mg via INTRAVENOUS
  Filled 2019-08-24: qty 10

## 2019-08-24 MED ORDER — SODIUM CHLORIDE 0.9 % IV SOLN
500.0000 mg/m2 | Freq: Once | INTRAVENOUS | Status: AC
  Administered 2019-08-24: 1100 mg via INTRAVENOUS
  Filled 2019-08-24: qty 40

## 2019-08-24 NOTE — Progress Notes (Signed)
Orders added for RN to release for [facility] to access and deaccess portacath

## 2019-08-24 NOTE — Progress Notes (Signed)
OK to treat with elevated Alk Phos and ALT per Dr. Lorenso Courier.

## 2019-08-24 NOTE — Patient Instructions (Signed)
[facility] Discharge Instructions for Patients Receiving Chemotherapy  Today you received the following chemotherapy agents Keytruda, Alimta and Carboplatin   To help prevent nausea and vomiting after your treatment, we encourage you to take your nausea medication as directed.    If you develop nausea and vomiting that is not controlled by your nausea medication, call the clinic.   BELOW ARE SYMPTOMS THAT SHOULD BE REPORTED IMMEDIATELY:  *FEVER GREATER THAN 100.5 F  *CHILLS WITH OR WITHOUT FEVER  NAUSEA AND VOMITING THAT IS NOT CONTROLLED WITH YOUR NAUSEA MEDICATION  *UNUSUAL SHORTNESS OF BREATH  *UNUSUAL BRUISING OR BLEEDING  TENDERNESS IN MOUTH AND THROAT WITH OR WITHOUT PRESENCE OF ULCERS  *URINARY PROBLEMS  *BOWEL PROBLEMS  UNUSUAL RASH Items with * indicate a potential emergency and should be followed up as soon as possible.  Feel free to call the clinic should you have any questions or concerns. The clinic phone number is [phone].  Please show the [place] at check-in to the Emergency Department and triage nurse.

## 2019-08-25 LAB — T4: T4, Total: 6.5 ug/dL (ref 4.5–12.0)

## 2019-08-26 IMAGING — CR DG PELVIS 1-2V
1 series · 1 of 1 positions shown · non-contrast
Comparison: None.

CLINICAL DATA: Acute left hip pain without known injury.

EXAM:
PELVIS - 1-2 VIEW

[x pelvis]
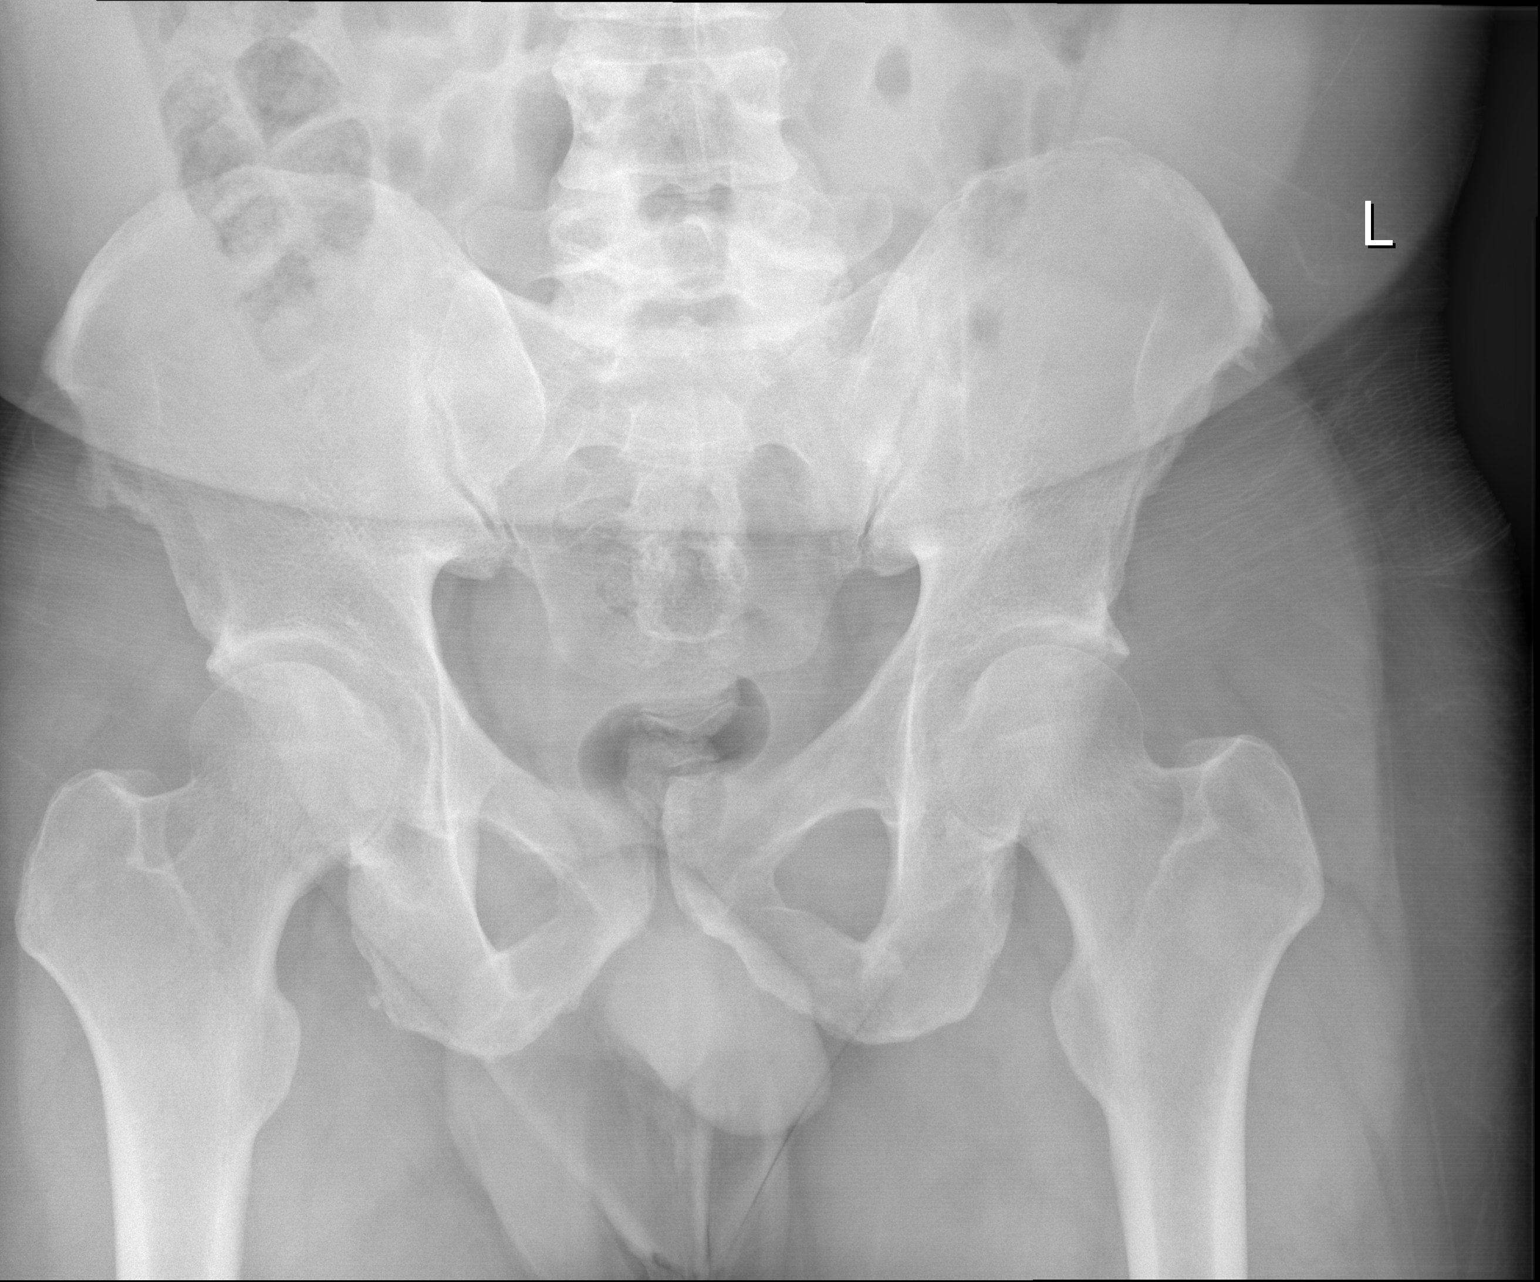

[1 of 1 positions shown; findings below may reference images not displayed]

FINDINGS: There is no evidence of pelvic fracture or diastasis. No pelvic bone
lesions are seen.
IMPRESSION: Negative.

## 2019-08-28 ENCOUNTER — Other Ambulatory Visit: Payer: Self-pay | Admitting: Radiation Therapy

## 2019-08-28 ENCOUNTER — Telehealth: Payer: Self-pay | Admitting: Internal Medicine

## 2019-08-28 NOTE — Telephone Encounter (Signed)
Scheduled appt per 8/2 sch msg - left message with appt date and time

## 2019-08-29 ENCOUNTER — Telehealth: Payer: Self-pay | Admitting: Hematology and Oncology

## 2019-08-29 NOTE — Telephone Encounter (Signed)
Scheduled per los. Called and left msg

## 2019-08-31 ENCOUNTER — Other Ambulatory Visit: Payer: Medicaid Other

## 2019-08-31 ENCOUNTER — Telehealth: Payer: Self-pay | Admitting: *Deleted

## 2019-08-31 ENCOUNTER — Other Ambulatory Visit: Payer: Self-pay | Admitting: Hematology and Oncology

## 2019-08-31 MED ORDER — LORAZEPAM 1 MG PO TABS
1.0000 mg | ORAL_TABLET | ORAL | 0 refills | Status: DC
Start: 2019-08-31 — End: 2019-09-04

## 2019-08-31 MED FILL — LORazepam 1 MG TABS: 1 | 1 days supply | Qty: 1 | Fill #0

## 2019-08-31 NOTE — Telephone Encounter (Signed)
Received call from pt's wife, Cindra Eves.  She states pt is scheduled for MRI of the brain and cervical spine tomorrow. She states he is claustrophobic and will need medication prior to scan.  Discussed with Dr. Lorenso Courier. He ordered Lorazepam 1 mg to be taken 30-60 minutes prior to scan.  TCT [facility] and advised that this medication is at [facility]. Reviewed instructions for taking this medication. She voiced understanding and is comfortable explaining it to [place]. No further questions or concerns

## 2019-09-01 ENCOUNTER — Ambulatory Visit: Admission: RE | Admit: 2019-09-01 | Payer: Medicaid Other | Source: Ambulatory Visit

## 2019-09-01 ENCOUNTER — Ambulatory Visit
Admission: RE | Admit: 2019-09-01 | Discharge: 2019-09-01 | Disposition: A | Discharge status: Home / Self Care | Payer: PRIVATE HEALTH INSURANCE | Source: Ambulatory Visit | Attending: Internal Medicine | Admitting: Internal Medicine

## 2019-09-01 DIAGNOSIS — C7949 Secondary malignant neoplasm of other parts of nervous system: Secondary | ICD-10-CM

## 2019-09-01 MED ORDER — HEPARIN SOD (PORK) LOCK FLUSH 100 UNIT/ML IV SOLN: 500.0000 [IU] | Freq: Once | INTRAVENOUS | Status: DC

## 2019-09-01 MED ORDER — SODIUM CHLORIDE 0.9% FLUSH: 10.0000 mL | INTRAVENOUS | Status: DC | PRN

## 2019-09-03 NOTE — Progress Notes (Signed)
  Radiation Oncology         [phone] ________________________________  Name: William Ali MRN: [id number]  Date: 06/21/2019  DOB: 20-Jul-1985  End of Treatment Note  Diagnosis:   Bone/ brain metastasis     Indication for treatment::  palliative       Radiation treatment dates:   05/31/19 - 06/21/19  Site/dose:   1. The L/S spine was treated to a dose of 30 Gy in 10 fractions using an 3Dplan. This consisted of 4 fields. 2. The upper T-spine was treated to a dose of 30 Gy in 10 fractions using an isodose plan. This consisted of 2 fields. 3. The lower T-spine was treated to a dose of 30 Gy in 10 fractions using an isodose plan. This consisted of 2 fields. 4. The whole brain was treated to a dose of 30 Gy in 10 fractions using an isodose plan. This consisted of 4 fields.    Narrative: The patient tolerated radiation treatment relatively well.     Plan: The patient has completed radiation treatment. The patient will return to radiation oncology clinic for routine followup in one month. I advised the patient to call or return sooner if they have any questions or concerns related to their recovery or treatment. ________________________________  Jodelle Gross, M.D., Ph.D.

## 2019-09-04 ENCOUNTER — Inpatient Hospital Stay: Payer: PRIVATE HEALTH INSURANCE

## 2019-09-04 ENCOUNTER — Other Ambulatory Visit: Payer: Self-pay | Admitting: *Deleted

## 2019-09-04 ENCOUNTER — Ambulatory Visit (HOSPITAL_COMMUNITY): Payer: PRIVATE HEALTH INSURANCE

## 2019-09-04 DIAGNOSIS — C7949 Secondary malignant neoplasm of other parts of nervous system: Secondary | ICD-10-CM

## 2019-09-04 MED ORDER — LORAZEPAM 1 MG PO TABS: 1.0000 mg | ORAL_TABLET | ORAL | 0 refills | Status: DC

## 2019-09-04 NOTE — Telephone Encounter (Signed)
TCT to pt's wife regarding upcoming scans: MRI or [facility] Edelman and Cervical spine have been rescheduled to [facility] on 09[facility] @ 1pm  With arrival time of 12:30 pm. Pt is to take his pain medication before he leaves his house for these scans and to bring his lorazepam with him to [facility]. He is to check in as scheduled and is to make sure his scan is going to be on time before he takes the lorazepam.  CT Scan [facility] is scheduled on 8/17 @ 1:30pm  VM message left for both Eduardo and Cindra Eves

## 2019-09-05 ENCOUNTER — Inpatient Hospital Stay: Payer: PRIVATE HEALTH INSURANCE | Admitting: Internal Medicine

## 2019-09-05 ENCOUNTER — Telehealth: Payer: Self-pay | Admitting: Hematology and Oncology

## 2019-09-05 NOTE — Telephone Encounter (Signed)
Scheduled appt per 8/09 sch msg - pt is aware of appt date and time on 8/13

## 2019-09-06 ENCOUNTER — Other Ambulatory Visit: Payer: Self-pay | Admitting: Hematology and Oncology

## 2019-09-06 ENCOUNTER — Ambulatory Visit (HOSPITAL_COMMUNITY)
Admission: RE | Admit: 2019-09-06 | Discharge: 2019-09-06 | Disposition: A | Discharge status: Home / Self Care | Payer: PRIVATE HEALTH INSURANCE | Source: Ambulatory Visit | Attending: Internal Medicine | Admitting: Internal Medicine

## 2019-09-06 ENCOUNTER — Other Ambulatory Visit: Payer: Self-pay

## 2019-09-06 ENCOUNTER — Inpatient Hospital Stay: Payer: PRIVATE HEALTH INSURANCE | Admitting: Hematology and Oncology

## 2019-09-06 ENCOUNTER — Inpatient Hospital Stay: Payer: PRIVATE HEALTH INSURANCE

## 2019-09-06 DIAGNOSIS — C7949 Secondary malignant neoplasm of other parts of nervous system: Secondary | ICD-10-CM | POA: Insufficient documentation

## 2019-09-06 IMAGING — MR MR CERVICAL SPINE W/O CM
4 of 5 series · 18 of 48 positions shown · non-contrast
Comparison: Cervical spine MRI [DATE].

CLINICAL DATA: 34-year-old male with widely metastatic lung cancer.
Restaging.

EXAM:
MRI CERVICAL SPINE WITHOUT CONTRAST
TECHNIQUE: Multiplanar, multisequence MR imaging of the cervical spine was
performed. No intravenous contrast was administered.

[Series 3: STIR · sagittal · 3.0mm · 0.43mm/px · 3 of 18 slices shown]
[im 1/18]
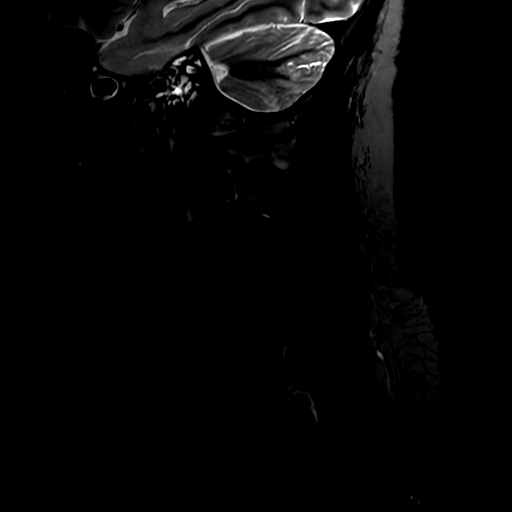
[im 9/18]
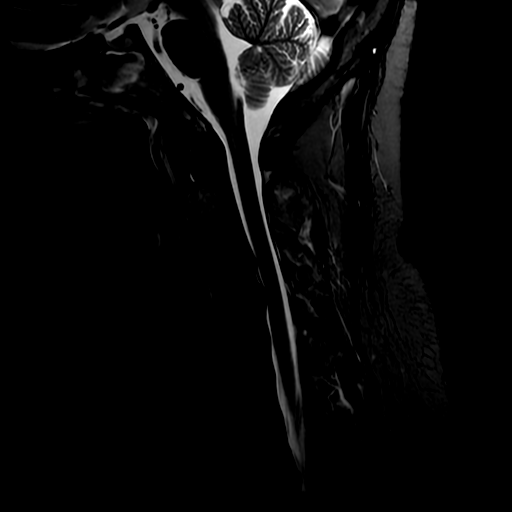
[im 18/18]
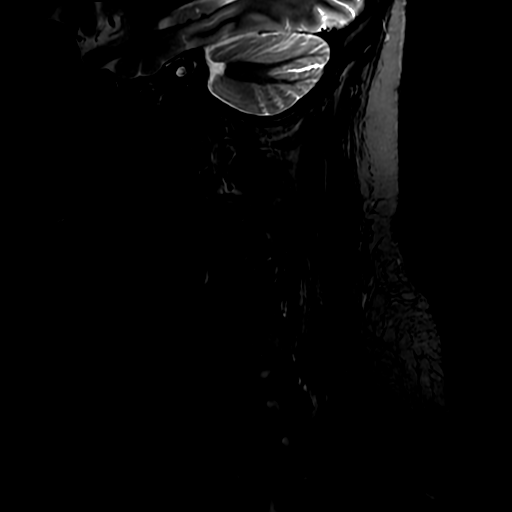

[Series 4: FLAIR · sagittal · 3.0mm · 0.43mm/px · 3 of 18 slices shown]
[im 1/18]
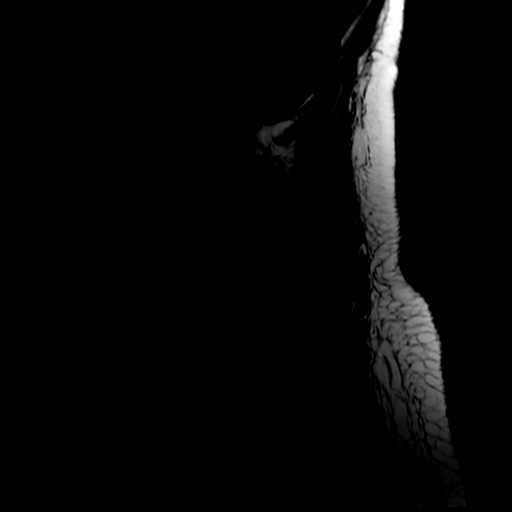
[im 9/18]
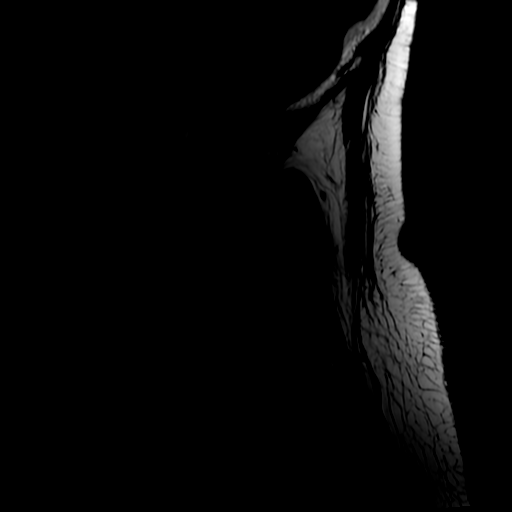
[im 18/18]
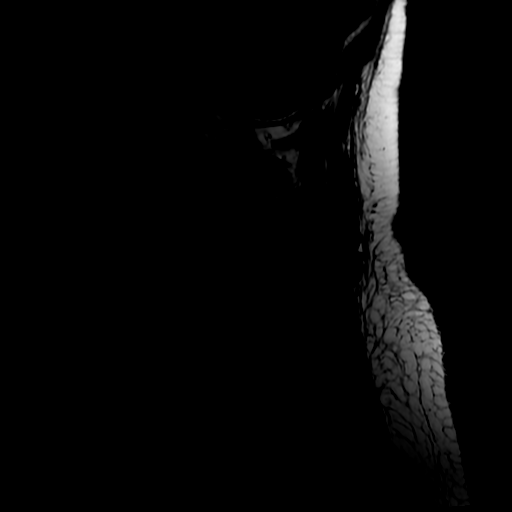

[Series 5: T2 · sagittal · 3.0mm · 0.43mm/px · 5 of 18 slices shown (1 of 2)]
[im 1/18]
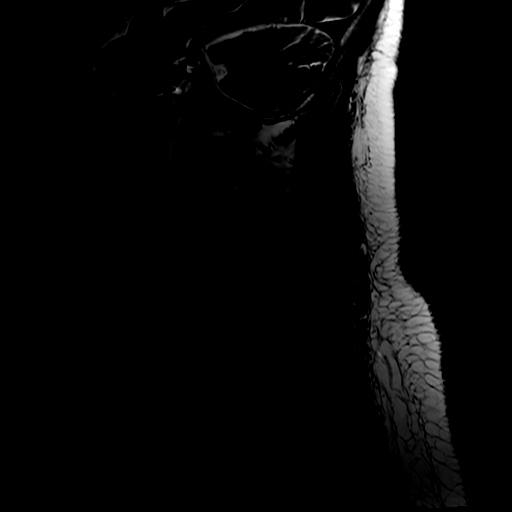
[im 5/18]
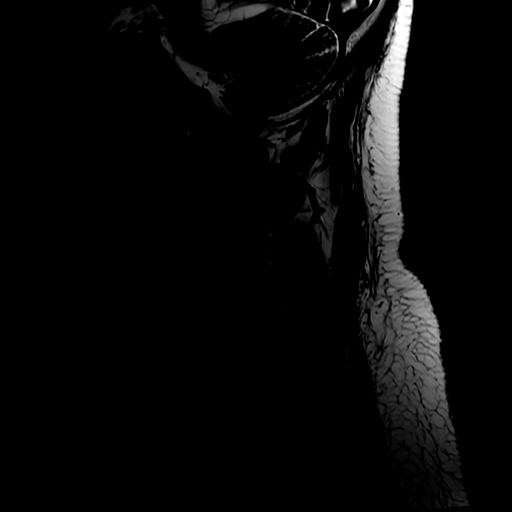
[im 9/18]
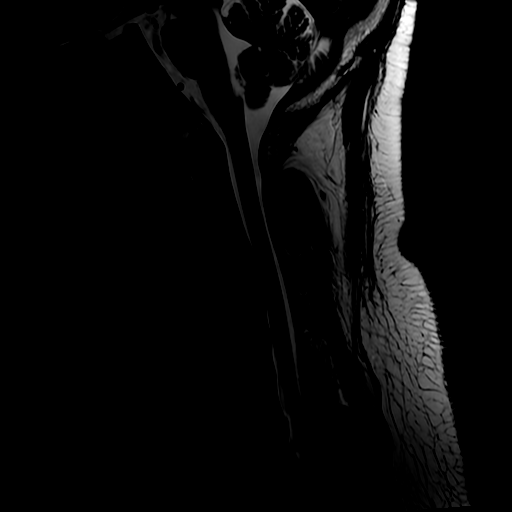
[im 13/18]
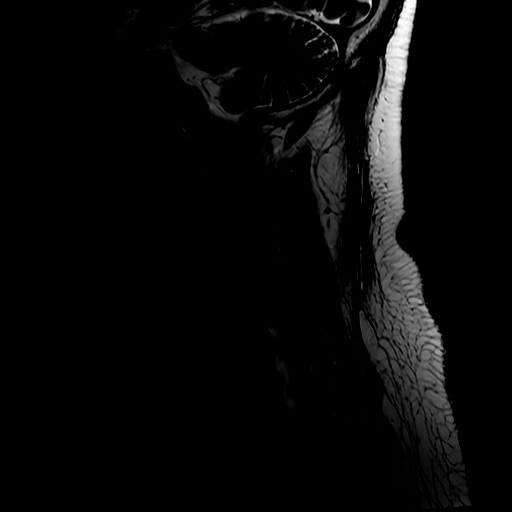
[im 18/18]
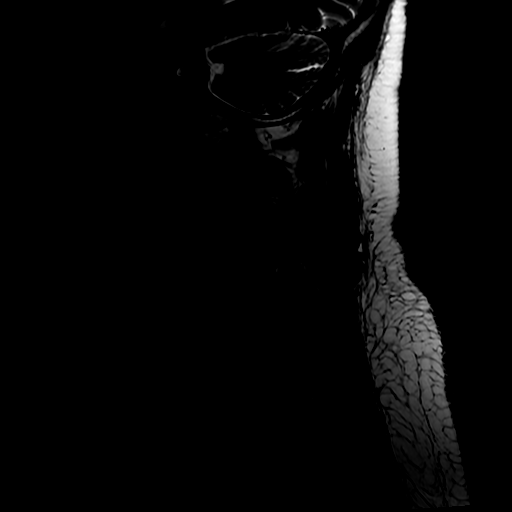

[Series 7: T2 · axial · 3.0mm · 0.35mm/px · z∈[-153,-76]mm · 7 of 30 slices shown (2 of 2)]
[im 1/30]
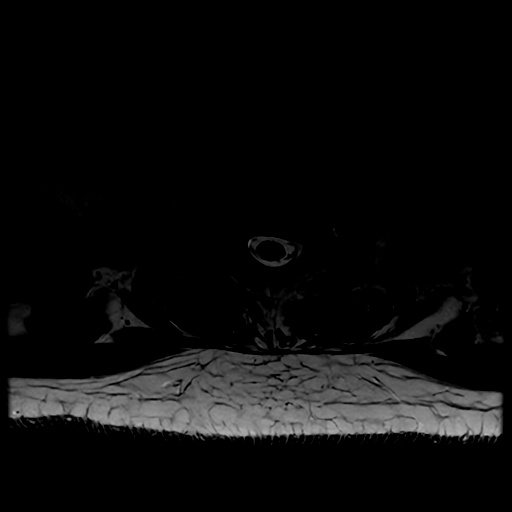
[im 5/30]
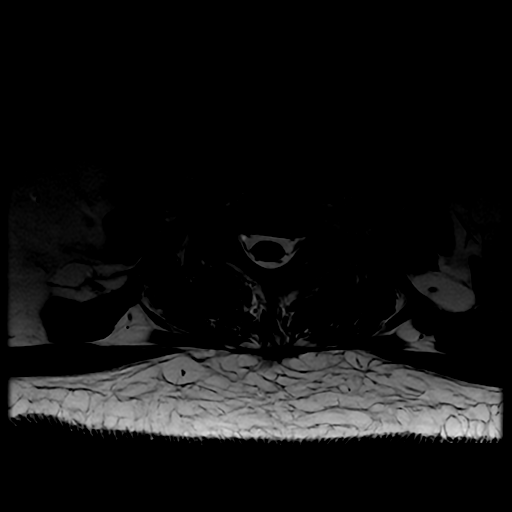
[im 9/30]
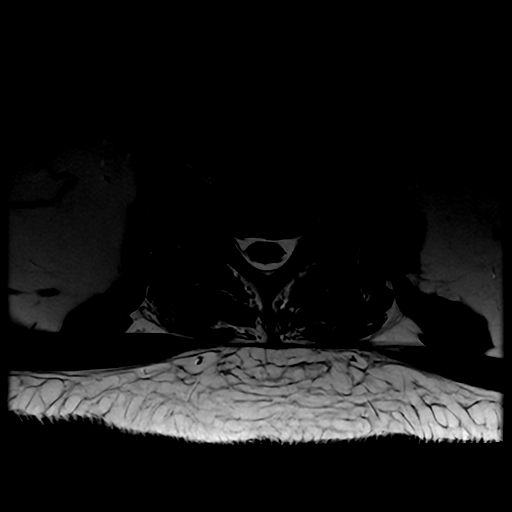
[im 13/30]
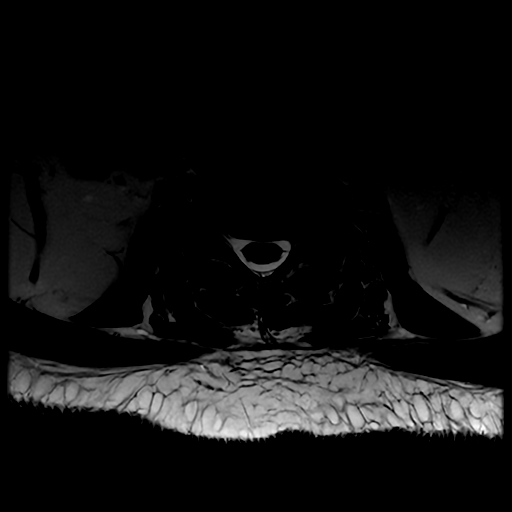
[im 17/30]
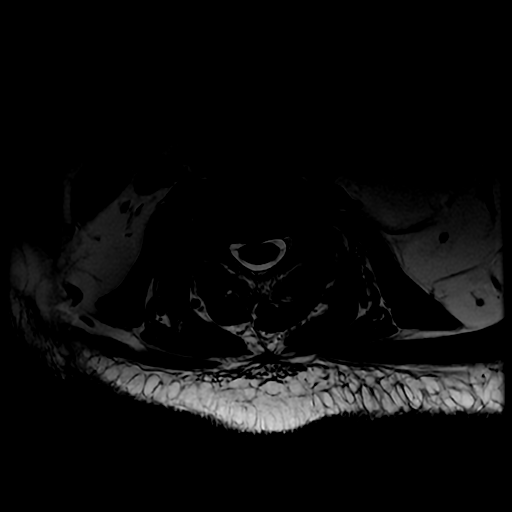
[im 21/30]
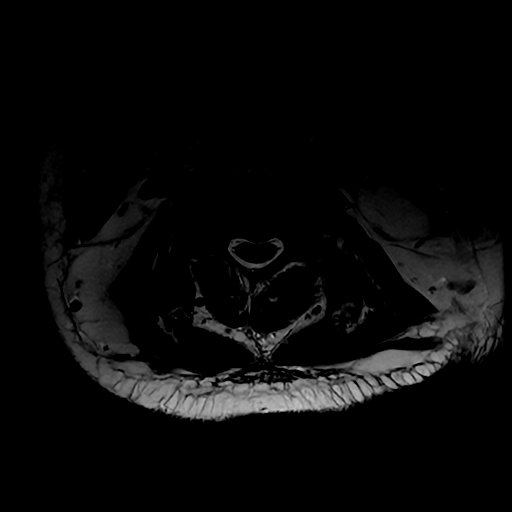
[im 25/30]
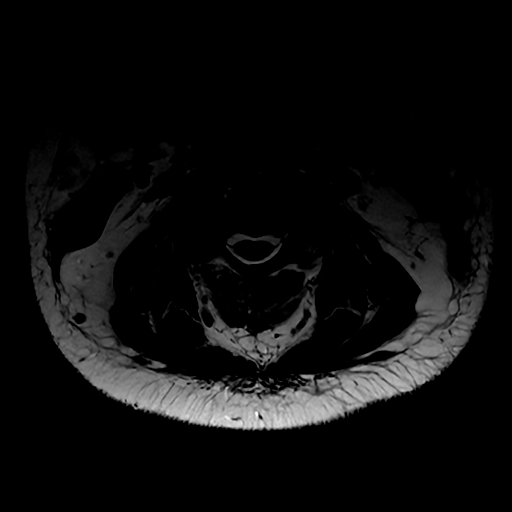

[18 of 48 positions shown; findings below may reference images not displayed]

FINDINGS: Alignment: Chronic straightening of lumbar lordosis, mild reversal
compared to May.

Vertebrae: Widespread T1 hypointense bone metastases throughout the
visible spine, and confluent involvement at the skull base including
the entire clivus and right occipital condyle. In the cervical spine
only the C1 vertebra appears spared. Extensive vertebral body and
posterior element involvement elsewhere. The C3 body is relatively
spared. No cervical vertebral pathologic fracture. There is evidence
of spinous process extraosseous extension of tumor at C2, C3, C6
(series 3, images 10 and 11).

No cervical spine epidural tumor identified.

Confluent upper thoracic metastases at T1 (with some spinous process
extraosseous tumor) and the posterior T2 body. The T3 pathologic
fracture seen [REDACTED] is not included today.

Cord: Normal noncontrast MRI appearance of the cervical spinal cord.
The visible T1 and T2 level cord also appears normal.

Posterior Fossa, vertebral arteries, paraspinal tissues: Negative
visible posterior fossa, brain parenchyma. Partially visible mastoid
and paranasal sinus fluid. Preserved major vascular flow voids in
the neck, the right vertebral artery appears dominant as before.

Disc levels:

Stable small cervical disc protrusions at C3-C4 and C4-C5 without
spinal stenosis.
IMPRESSION: 1. Widespread osseous metastatic disease throughout the visible
skull base and cervical spine, not significantly changed [REDACTED] -
although there does appear to be new extraosseous extension of tumor
from several spinous processes.

2. There is no cervical spine epidural tumor or cord compression.
Note that the T3 pathologic fracture seen [REDACTED] is not included
today.

## 2019-09-06 IMAGING — MR MR HEAD WO/W CM
6 of 13 series · 22 of 48 positions shown · IV contrast (9.5 M GAD)
Comparison: Brain and orbit MRI [DATE].  Head CT [DATE].

CLINICAL DATA: 34-year-old male with widely metastatic lung cancer.
Status post whole brain radiation [REDACTED]. Restaging.

EXAM:
MRI HEAD WITHOUT AND WITH CONTRAST
TECHNIQUE: Multiplanar, multiecho pulse sequences of the brain and surrounding
structures were obtained without and with intravenous contrast.
CONTRAST:  9.5mL GADAVIST GADOBUTROL 1 MMOL/ML IV SOLN

[Series 2: DWI · axial · 3.0mm · 0.94mm/px · z∈[-156,-0]mm · 6 of 114 slices shown (1 of 2)]
[im 1/114]
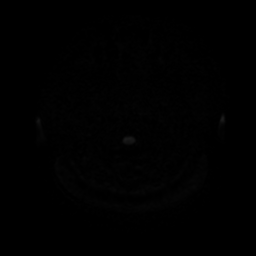
[im 23/114]
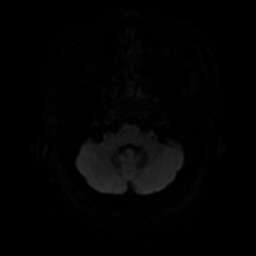
[im 46/114]
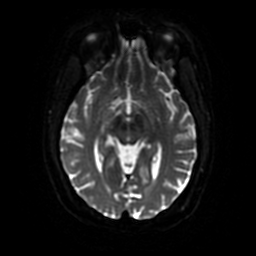
[im 68/114]
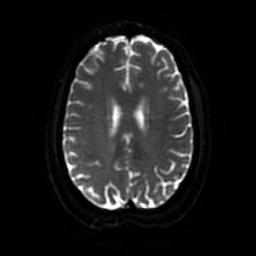
[im 91/114]
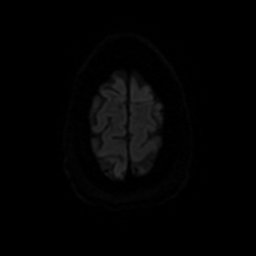
[im 114/114]
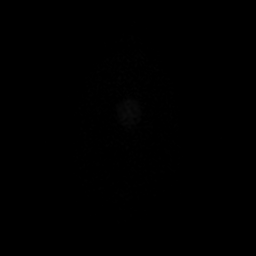

[Series 3: DWI · coronal · 4.0mm · 0.94mm/px · 5 of 80 slices shown (2 of 2)]
[im 1/80]
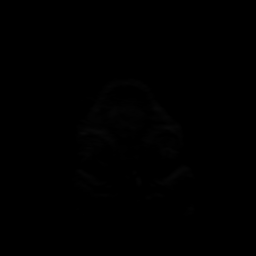
[im 20/80]
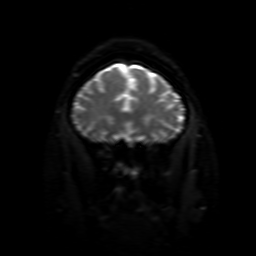
[im 40/80]
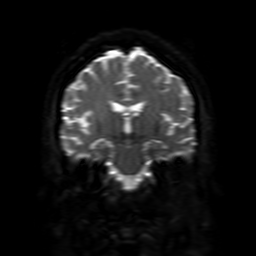
[im 60/80]
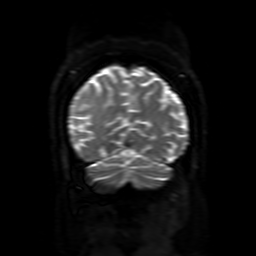
[im 80/80]
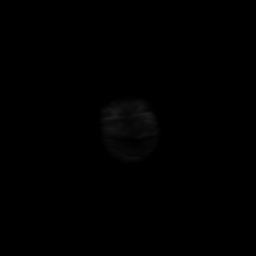

[Series 4: FLAIR · sagittal · 5.0mm · 0.23mm/px · 2 of 26 slices shown (1 of 2)]
[im 1/26]
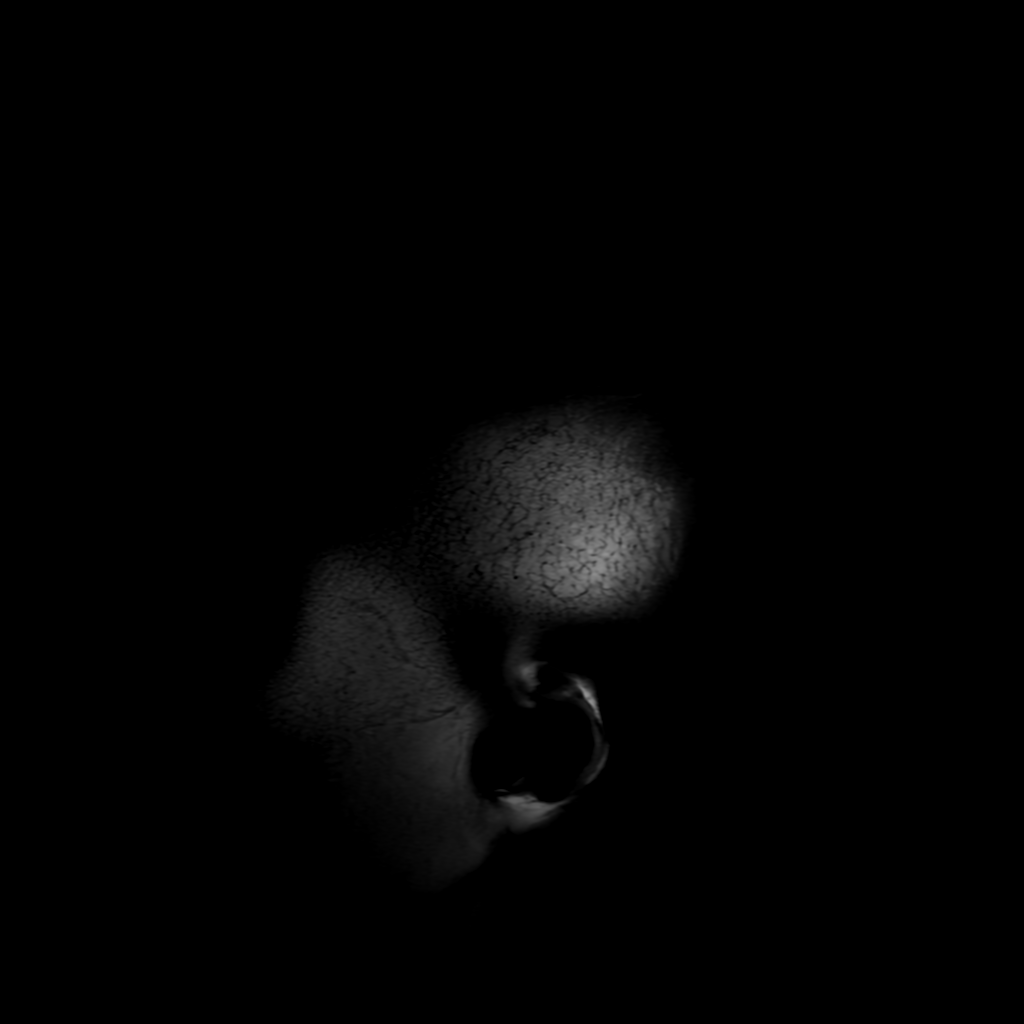
[im 26/26]
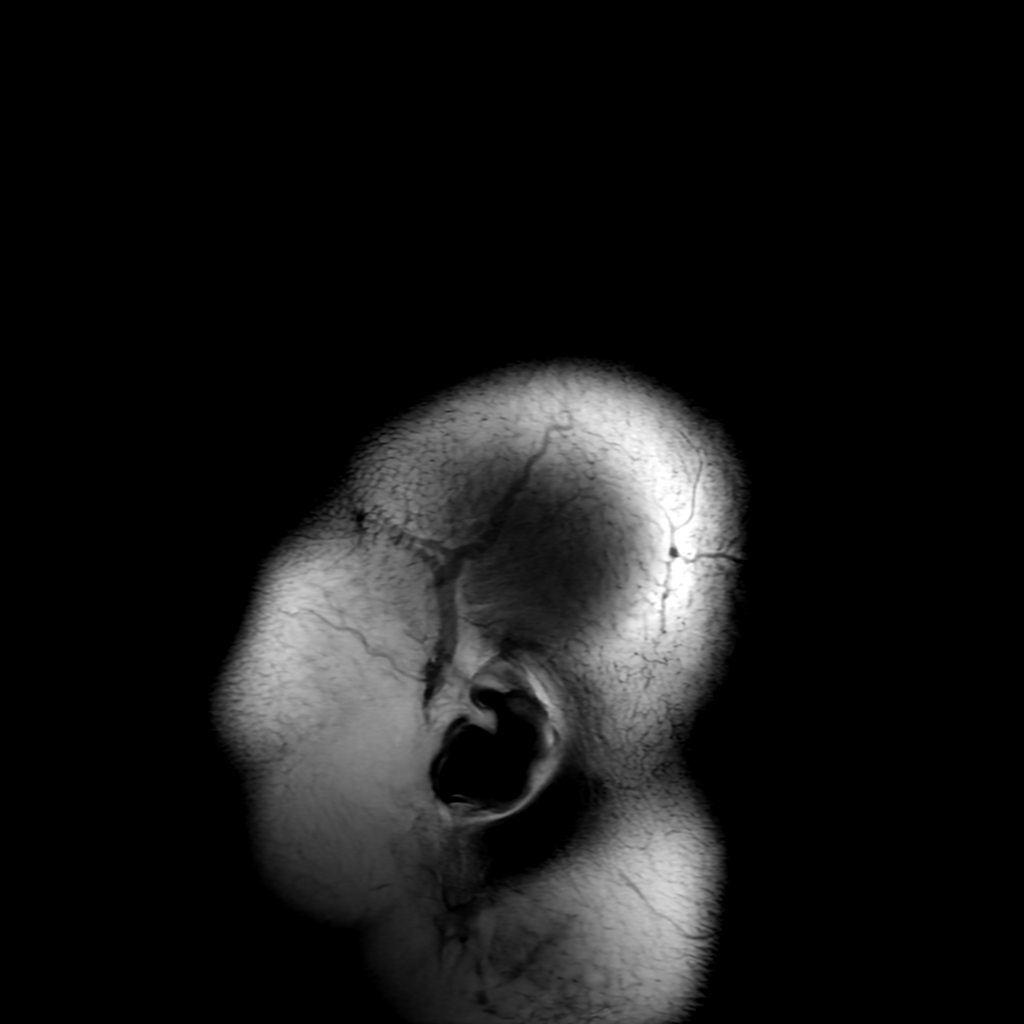

[Series 6: FLAIR · axial · 3.0mm · 0.45mm/px · z∈[-147,+11]mm · 2 of 29 slices shown (2 of 2)]
[im 1/29]
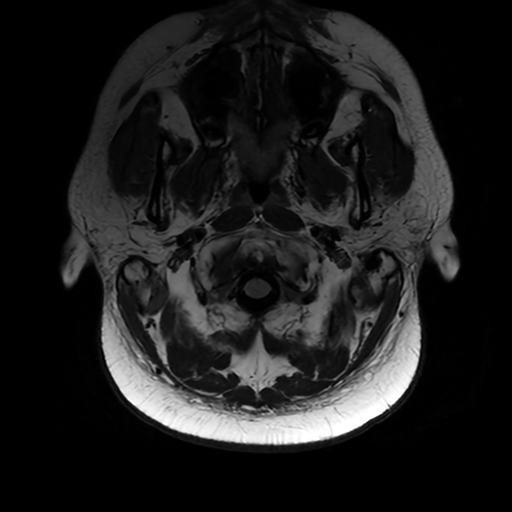
[im 29/29]
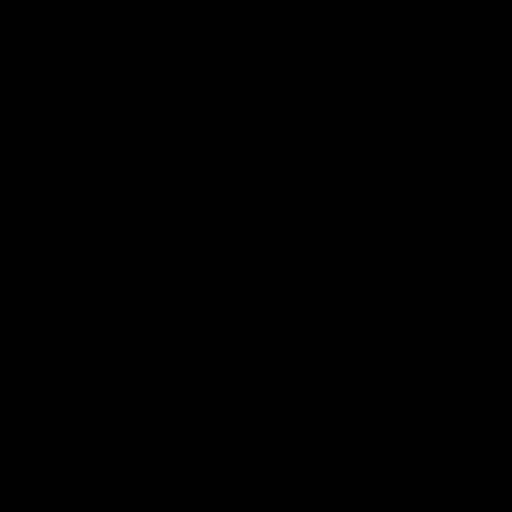

[Series 250: ADC · axial · 3.0mm · 0.94mm/px · z∈[-156,-0]mm · 4 of 57 slices shown (1 of 2)]
[im 1/57]
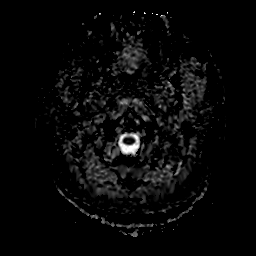
[im 19/57]
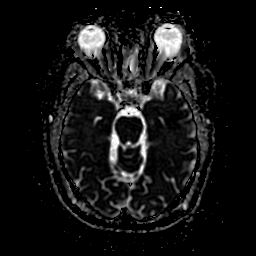
[im 38/57]
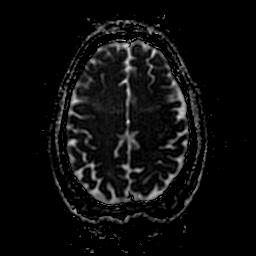
[im 57/57]
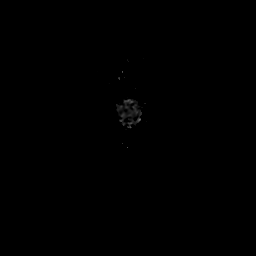

[Series 350: ADC · coronal · 4.0mm · 0.94mm/px · 3 of 40 slices shown (2 of 2)]
[im 1/40]
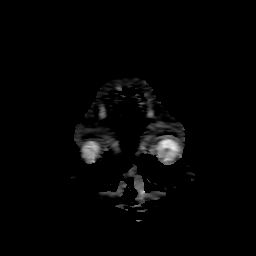
[im 20/40]
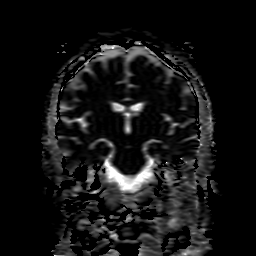
[im 40/40]
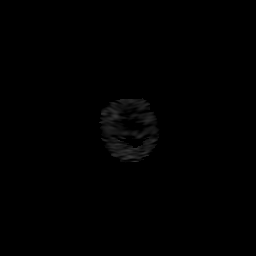

[22 of 48 positions shown; findings below may reference images not displayed]

FINDINGS: Brain: This is the first 3 Tesla MRI of the brain on this patient.

Postcontrast images demonstrate greater than 35 small enhancing
brain metastases ranging from punctate 2 11 mm diameter. Notable
lesions include the posteromedial right parietal lobe on series 10,
image 35, superior left thalamus abutting the body of the lateral
ventricle on series 11, image 18, right dorsal midbrain at the
superior colliculus on series 10, image 21, cerebellar vermis on
series 10, image 11.

Despite the large number of enhancing metastases there is only mild
scattered cerebral edema associated with the largest lesions. No
intracranial mass effect or midline shift. Many of the metastases
also now demonstrate microhemorrhage on SWI which is new since HT SPARTA
and likely a post radiation effect.

No abnormal diffusion suggestive of ischemia or infarct. No
ventriculomegaly. No acute intracranial hemorrhage. No dural or
leptomeningeal thickening identified. Negative pituitary and
cervicomedullary junction.

Vascular: Major intracranial vascular flow voids are stable. The
major dural venous sinuses are enhancing and appear to be patent.

Skull and upper cervical spine: Cervical spine is reported
separately today. Confluent bone metastasis throughout the clivus
and the right occipital condyle is noted with superimposed
inspissated T1 hyperintense secretions in the sphenoid sinuses.

Sinuses/Orbits: Negative orbits. Inspissated secretions in the
sphenoid sinuses.

Other: Mild bilateral mastoid effusions. Grossly normal visible
internal auditory structures. Scalp and face appear negative.
IMPRESSION: 1. Status post whole brain radiation [REDACTED] with a large number
(>35) of small enhancing brain metastases on this first 3 Tesla
Brain study for this patient.
Brain mets range from punctate to 11 mm diameter. Many demonstrate
micro-hemorrhage which is likely the sequelae of radiation. But only
small areas of edema from the largest lesions, and no intracranial
mass effect.

2. Confluent bone metastasis throughout the clivus and at the right
occipital condyle. Superimposed inspissated T1 hyperintense
secretions in the sphenoid sinuses.

3. Cervical spine MRI reported separately today.

## 2019-09-06 MED ORDER — LORAZEPAM 1 MG PO TABS: 1.0000 mg | ORAL_TABLET | ORAL | 0 refills | Status: DC

## 2019-09-06 MED ORDER — HEPARIN SOD (PORK) LOCK FLUSH 100 UNIT/ML IV SOLN
500.0000 [IU] | INTRAVENOUS | Status: AC | PRN
  Administered 2019-09-06: 500 [IU]
  Filled 2019-09-06: qty 5

## 2019-09-06 MED ORDER — GADOBUTROL 1 MMOL/ML IV SOLN
9.5000 mL | Freq: Once | INTRAVENOUS | Status: AC | PRN
  Administered 2019-09-06: 9.5 mL via INTRAVENOUS

## 2019-09-06 NOTE — Telephone Encounter (Signed)
TCT pt's wife, Cindra Eves. Confirmed Jimmy's upcoming scan appts with her (MRI brain and cervical spine and CT chest, abd, pelvis) She states she did get my message on 09/04/19 and understood all directions. Advised to pick up his lorazepam @ [facility] on [address] prior to today's MRI.  She voiced understanding. She is aware of his appt with Dr. Lorenso Courier on Friday, 09/08/19

## 2019-09-07 ENCOUNTER — Other Ambulatory Visit: Payer: Self-pay | Admitting: Hematology and Oncology

## 2019-09-07 ENCOUNTER — Encounter: Payer: Self-pay | Admitting: Hematology and Oncology

## 2019-09-07 ENCOUNTER — Ambulatory Visit: Payer: PRIVATE HEALTH INSURANCE | Admitting: Critical Care Medicine

## 2019-09-07 DIAGNOSIS — C3491 Malignant neoplasm of unspecified part of right bronchus or lung: Secondary | ICD-10-CM

## 2019-09-07 DIAGNOSIS — C7949 Secondary malignant neoplasm of other parts of nervous system: Secondary | ICD-10-CM

## 2019-09-07 NOTE — Progress Notes (Unsigned)
Subjective:    Patient ID: William Ali, male    DOB: 04-05-1985, 34 y.o.   MRN: [id number]  This is a pleasant 34 year old male who comes to see me to establish for primary care and he is accompanied with his spouse.  This patient unfortunately developed a nodular density in March 2020 and another x-ray obtained in April 2020 recommending follow-up imaging with a nodule seen in the lung.  Subsequent to this the patient presented to the emergency room in mid April with headaches and left pelvic pain ultimately found to have a cavitary lesion in the right lung apex with spiculated margins and additional sites of disease including lymph nodes of the chest and numerous liver mets numerous multiple lytic appearing lesions throughout the thoracic and lumbar spine as well as portions of the bony pelvis.  Patient also had MRI of the brain showing numerous enhancing parenchymal intracranial lesions likely metastases.  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  The patient is now undergoing treatment and outlined below is the patient's treatment summary per oncology   #MetastaticAdenocarcinoma of the Lungwith Brain, Osseus,and Liver Metastasis 1)04/24/2018: Chest radiograph shows a 1.7 cm nodular density. Short term f/u of this lesion was recommended. CXR on 05/01/2018 performed with recommended f/u imaging. 2) 05/10/2019: presented to the ED with headache and left pelvis pain. Patient underwent CT C/A/P and MRI brain. Brain MRI showed numerous enhancing parenchymal intracranial lesions likely reflecting metastases. CT scan showedcavitary lesion in therightlung apex with spiculated margins. Additional sites of disease include lymph nodes of the chest and numerous liver mets. In the bones there weremultiple lytic appearing lesions throughout the thoracic and lumbar spine as well as portions of the bony pelvis. Referred to Oncology. 3) 05/16/2019: US  biopsy of the liver performed. Findings consistent with a poorly differentiated adenocarcinoma.  4) 05/19/2019: establish care with Dr. Lorenso Courier.  5) 05/30/2019: CT simulation. Received Radiation to brain/spine.  5) 06/23/2019: Received Cycle 1 Day 1 of Carbo/Pemetrexed while inpatient. Pembrolizumab not able to be administered in inpatient setting. 6) 07/13/2019:Cycle 2 Day 1 of Carboplatin/Pemetrexed/Pembrolizumab 7)  08/04/2019:Cycle 3 day 1 of Carboplatin/Pemetrexed/Pembrolizumab  Note this patient now is starting cycle 4 of his chemotherapy and has finished his brain and spine radiation therapy.  But the patient does have access to Dilaudid as needed for pain and now also has a fentanyl patch which is helped to some degree.  He states his breathing is at a stable situation for now.  His wife states he has some noises he makes when he breathes at night.  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  The patient is [facility]  We will attempt to get his Xarelto and fentanyl patches at least at a reduced amount at the [facility].  Repeat scans are due to be obtained in August as noted.  Patient has a physical therapist coming to his home as well.  Note patient is on Decadron and antiemetics as well.  The patient is a full code at this time and is not yet in any type of hospice care but is interested in appropriately receiving aggressive treatment for his condition though he does realize it is a palliative approach as per oncology's recent note Note there is a history of pulmonary embolism which is why he is on the Xarelto  due to hypercoagulable state.  Also note the patient is being managed by radiation therapy, medical oncology and neuro-oncology.  The patient's weight loss is improved and he is taking a nutritional supplements at this time.  09/07/2019  Pulmonary  embolism (HCC) History of pulmonary embolism and deep vein thrombosis due to hypercoagulable state secondary to metastatic adenocarcinoma of the lung  We will continue Xarelto 20 mg daily for life  DVT (deep venous thrombosis) (HCC) As per pulmonary embolism assessment  Essential hypertension Hypertension well controlled at this time on hydrochlorothiazide and metoprolol will continue same  Adenocarcinoma of lung (HCC) Primary carcinoma of the lung adeno poorly differentiated with multiple metastases  Care per oncology  Metastatic primary lung cancer (HCC) Metastatic primary lung cancer to and that is with bone liver and brain involvement  Need for chronic palliation  We will continue Decadron and obtain refills, will also obtain refills ofThe Xarelto and Dilaudid along with fentanyl  Gastroesophageal reflux disease without esophagitis Reflux disease will continue Protonix twice daily  Metastatic adenocarcinoma to brain [facility]) Metastatic adenocarcinoma to brain patient is continuing Decadron has completed his course of radiotherapy and is followed by neuro oncology  Goals of care, counseling/discussion The patient did have a goals of care discussion previously we had a brief one today he wishes to continue to be full code and to hold off on hospice care.  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 Past Medical History:  Diagnosis Date  . Back pain   . Cancer (HCC)   . Hypertension      Family History  Problem Relation Age of Onset  . Hypertension Mother   . Diabetes  Father      Social History   Socioeconomic History  . Marital status: Married    Spouse name: Not on file  . Number of children: Not on file  . Years of education: Not on file  . Highest education level: Not on file  Occupational History  . Not on file  Tobacco Use  . Smoking status: Never Smoker  . Smokeless tobacco: Never Used  Vaping Use  . Vaping Use: Never used  Substance and Sexual Activity  . Alcohol use: No  . Drug use: No  . Sexual activity: Yes  Other Topics Concern  . Not on file  Social History Narrative  . Not on file   Social Determinants of Health   Financial Resource Strain:   . Difficulty of Paying Living Expenses:   Food Insecurity:   . Worried About Programme researcher, broadcasting/film/video in the Last Year:   . Barista in the Last Year:   Transportation Needs:   . Freight forwarder (Medical):   Marland Kitchen Lack of Transportation (Non-Medical):   Physical Activity:   . Days of Exercise per Week:   . Minutes of Exercise per Session:   Stress:   . Feeling of Stress :   Social Connections:   . Frequency of Communication with Friends and Family:   . Frequency of Social Gatherings with Friends and Family:   .  Attends Religious Services:   . Active Member of Clubs or Organizations:   . Attends Archivist Meetings:   Marland Kitchen Marital Status:   Intimate Partner Violence:   . Fear of Current or Ex-Partner:   . Emotionally Abused:   Marland Kitchen Physically Abused:   . Sexually Abused:      Allergies  Allergen Reactions  . Onion Anaphylaxis  . Other     All sea food causes swelling and a rash.    . Peanut Oil Itching  . Peanut-Containing Drug Products Itching     Outpatient Medications Prior to Visit  Medication Sig Dispense Refill  . albuterol (VENTOLIN HFA) 108 (90 Base) MCG/ACT inhaler TAKE 2 PUFFS BY MOUTH EVERY 6 HOURS AS NEEDED FOR WHEEZE OR SHORTNESS OF BREATH 6.7 g 1  . alum & mag hydroxide-simeth (MAALOX MULTI SYMPTOM MAX ST) 400-400-40 MG/5ML suspension Take  10 mLs by mouth every 6 (six) hours as needed for indigestion. 355 mL 2  . amphetamine-dextroamphetamine (ADDERALL) 10 MG tablet Take 10 mg by mouth every morning, may take '10mg'$  additional dose daily as needed for fatigue (Patient not taking: Reported on 08/23/2019) 60 tablet 0  . dexamethasone (DECADRON) 4 MG tablet Take 1 tablet (4 mg total) by mouth 2 (two) times daily with a meal. Starting on 5/11, and drop dose to once daily on 5/14 60 tablet 0  . DULoxetine (CYMBALTA) 30 MG capsule Take 1 capsule (30 mg total) by mouth daily. 30 capsule 3  . fentaNYL (DURAGESIC) 75 MCG/HR Place 1 patch onto the skin every 3 (three) days. 5 patch 0  . folic acid (FOLVITE) 1 MG tablet Take 1 tablet (1 mg total) by mouth daily. 60 tablet 3  . HYDROmorphone (DILAUDID) 8 MG tablet Take 1/2- 1 tablet every 4 hours as needed for pain 90 tablet 0  . lidocaine-prilocaine (EMLA) cream Apply 1 application topically as needed. 30 g 2  . LORazepam (ATIVAN) 1 MG tablet Take 1 tablet (1 mg total) by mouth as directed. Please take 30 to 60 minutes prior to CT scans. 1 tablet 0  . metoprolol succinate (TOPROL XL) 50 MG 24 hr tablet Take 1 tablet (50 mg total) by mouth daily. Take with or immediately following a meal. 30 tablet 11  . ondansetron (ZOFRAN) 4 MG tablet Take 1 tablet (4 mg total) by mouth every 8 (eight) hours as needed for nausea or vomiting. 60 tablet 0  . pantoprazole (PROTONIX) 40 MG tablet Take 1 tablet (40 mg total) by mouth 2 (two) times daily. 60 tablet 0  . polyethylene glycol (MIRALAX / GLYCOLAX) 17 g packet Take 17 g by mouth daily. 14 each 0  . rivaroxaban (XARELTO) 20 MG TABS tablet Take 1 tablet (20 mg total) by mouth daily with supper. 30 tablet 1  . senna-docusate (SENOKOT-S) 8.6-50 MG tablet Take 2 tablets by mouth 2 (two) times daily. (Patient taking differently: Take 3 tablets by mouth at bedtime. ) 120 tablet 0   No facility-administered medications prior to visit.     Review of Systems    Constitutional: Positive for activity change, appetite change, fatigue and unexpected weight change.  HENT: Negative for congestion, dental problem, drooling, ear discharge, facial swelling, hearing loss, nosebleeds, postnasal drip, rhinorrhea, sinus pressure, sinus pain, sore throat and voice change.   Eyes: Positive for visual disturbance.  Respiratory: Positive for cough and shortness of breath.   Cardiovascular: Positive for chest pain.  Gastrointestinal: Positive for constipation, nausea and vomiting.  Endocrine: Negative.   Genitourinary: Negative.   Musculoskeletal: Positive for back pain, joint swelling and neck pain.  Skin: Negative.   Neurological: Positive for dizziness, weakness and headaches. Negative for tremors, seizures, syncope, facial asymmetry, speech difficulty, light-headedness and numbness.  Hematological: Negative for adenopathy. Does not bruise/bleed easily.  Psychiatric/Behavioral: Positive for decreased concentration, dysphoric mood and sleep disturbance. Negative for behavioral problems, confusion, self-injury and suicidal ideas. The patient is nervous/anxious.        Objective:   Physical Exam There were no vitals filed for this visit.  Gen: Depressed and tearful, well-nourished, in no distress,    ENT: No lesions,  mouth clear,  oropharynx clear, no postnasal drip  Neck: No JVD, no TMG, no carotid bruits  Lungs: No use of accessory muscles, no dullness to percussion, clear without rales or rhonchi  Cardiovascular: RRR, heart sounds normal, no murmur or gallops, no peripheral edema  Abdomen: soft and NT, no HSM,  BS normal  Musculoskeletal: No deformities, no cyanosis or clubbing  Neuro: alert, non focal  Skin: Warm, no lesions or rashes  BMP Latest Ref Rng & Units 08/24/2019 08/16/2019 08/04/2019  Glucose 70 - 99 mg/dL 152(H) 135(H) 176(H)  BUN 6 - 20 mg/dL 22(H) 27(H) 19  Creatinine 0.61 - 1.24 mg/dL 1.24 1.19 1.15  Sodium 135 - 145 mmol/L 142 139  142  Potassium 3.5 - 5.1 mmol/L 4.2 4.6 4.3  Chloride 98 - 111 mmol/L 109 107 106  CO2 22 - 32 mmol/L 23 21(L) 22  Calcium 8.9 - 10.3 mg/dL 9.8 9.2 8.9   Hepatic Function Latest Ref Rng & Units 08/24/2019 08/16/2019 08/04/2019  Total Protein 6.5 - 8.1 g/dL 7.0 7.0 7.0  Albumin 3.5 - 5.0 g/dL 3.3(L) 3.2(L) 3.2(L)  AST 15 - 41 U/L 51(H) 48(H) 39  ALT 0 - 44 U/L 165(H) 136(H) 117(H)  Alk Phosphatase 38 - 126 U/L 505(H) 446(H) 559(H)  Total Bilirubin 0.3 - 1.2 mg/dL 0.3 0.3 0.3  Bilirubin, Direct 0.0 - 0.2 mg/dL - - -   CBC Latest Ref Rng & Units 08/24/2019 08/16/2019 08/04/2019  WBC 4.0 - 10.5 K/uL 3.8(L) 7.1 5.7  Hemoglobin 13.0 - 17.0 g/dL 9.7(L) 9.9(L) 9.6(L)  Hematocrit 39 - 52 % 31.6(L) 31.5(L) 31.8(L)  Platelets 150 - 400 K/uL 100(L) 83(L) 150   All images and [facility] link were reviewed      Assessment & Plan:  I personally reviewed all images and lab data in the [facility] system as well as any outside material available during this office visit and agree with the  radiology impressions.   No problem-specific Assessment & Plan notes found for this encounter.   There are no diagnoses linked to this encounter.

## 2019-09-07 NOTE — Progress Notes (Signed)
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 Short term f/u of this lesion was recommended. CXR on 05/01/2018 performed with recommended f/u imaging. 2) 05/10/2019: presented to the ED with headache and left pelvis pain. Patient underwent CT C/A/P and MRI brain. Brain MRI showed numerous enhancing parenchymal intracranial lesions likely reflecting metastases. CT scan showed cavitary lesion in the right lung apex with spiculated margins. Additional sites of disease include lymph nodes of the chest and numerous liver mets. In the bones there were multiple lytic appearing lesions throughout the thoracic and lumbar spine as well as portions of the bony pelvis. Referred to Oncology. 3) 05/16/2019: US biopsy of the liver performed. Findings consistent with a poorly differentiated adenocarcinoma.  4) 05/19/2019: establish care with Dr. Lorenso Courier.  5) 05/30/2019: CT simulation. Received Radiation to brain/spine.  5) 06/23/2019: Received Cycle 1 Day 1 of Carbo/Pemetrexed while inpatient. Pembrolizumab not able to be administered in inpatient setting. 6) 07/13/2019:Cycle 2 Day 1 of Carboplatin/Pemetrexed/Pembrolizumab 7)  08/04/2019:Cycle 3 day 1 of Carboplatin/Pemetrexed/Pembrolizumab 8) 08/24/2019: Cycle 4 day 1 of Carboplatin/Pemetrexed/Pembrolizumab 9) 09/14/2019: Cycle 5 day 1 of Carboplatin/Pemetrexed/Pembrolizumab  Interval History:  [place] Isidoro Donning 34 y.o. male with medical history significant for metastatic adenocarcinoma of the lung who presents for a follow up visit.  The patient's last visit was on 08/24/2019 prior to Cycle 4 day 1 of chemotherapy.   On exam today Mr. Sweeden accompanied by his wife who answers  most of the questions for him.  He was in quite a lot of pain at the beginning of our visit and therefore we administered 2 mg IV Dilaudid in order to help control the pain.  His wife reports he is not getting up much and that his pain has been worsening over the last few days.  He reports it is mostly in his back and in his stomach.  He is also having discomfort of his leg.  The wife reports that he has been eating okay and that his bowel movements have been regular.  He had to increase his pain medication to 8 mg p.o. Dilaudid yesterday, however he had only previously been taking half a pill at 4 mg p.o. daily.  His fentanyl patches are at 75 mcg every 72 hours, and he has been wearing these faithfully.  He just switched fentanyl patch the other day.  On further discussion the patient is had no issues with nausea, vomiting, or diarrhea.  He denies having any fevers, chills, sweats.  He does occasionally have symptoms of the shortness of breath, but is not having any cough.  He has been tolerating his Xarelto therapy well without any bleeding, or bruising.  A full 10 point ROS is listed below. MEDICAL HISTORY:  Past Medical History:  Diagnosis Date  . Back pain   . Cancer ([place])   . Hypertension     SURGICAL HISTORY: Past Surgical History:  Procedure Laterality Date  . IR IMAGING GUIDED PORT INSERTION  06/12/2019  . KNEE SURGERY      SOCIAL HISTORY: Social History   Socioeconomic History  . Marital status: Married    Spouse name: Not on file  . Number of children: Not on file  . Years of education: Not on file  .  Highest education level: Not on file  Occupational History  . Not on file  Tobacco Use  . Smoking status: Never Smoker  . Smokeless tobacco: Never Used  Vaping Use  . Vaping Use: Never used  Substance and Sexual Activity  . Alcohol use: No  . Drug use: No  . Sexual activity: Yes  Other Topics Concern  . Not on file  Social History Narrative  . Not on file   Social  Determinants of Health   Financial Resource Strain:   . Difficulty of Paying Living Expenses:   Food Insecurity:   . Worried About Charity fundraiser in the Last Year:   . Arboriculturist in the Last Year:   Transportation Needs:   . Film/video editor (Medical):   Marland Kitchen Lack of Transportation (Non-Medical):   Physical Activity:   . Days of Exercise per Week:   . Minutes of Exercise per Session:   Stress:   . Feeling of Stress :   Social Connections:   . Frequency of Communication with Friends and Family:   . Frequency of Social Gatherings with Friends and Family:   . Attends Religious Services:   . Active Member of Clubs or Organizations:   . Attends Archivist Meetings:   Marland Kitchen Marital Status:   Intimate Partner Violence:   . Fear of Current or Ex-Partner:   . Emotionally Abused:   Marland Kitchen Physically Abused:   . Sexually Abused:     FAMILY HISTORY: Family History  Problem Relation Age of Onset  . Hypertension Mother   . Diabetes Father     ALLERGIES:  is allergic to onion, other, peanut oil, and peanut-containing drug products.  MEDICATIONS:  Current Outpatient Medications  Medication Sig Dispense Refill  . albuterol (VENTOLIN HFA) 108 (90 Base) MCG/ACT inhaler TAKE 2 PUFFS BY MOUTH EVERY 6 HOURS AS NEEDED FOR WHEEZE OR SHORTNESS OF BREATH 6.7 g 1  . alum & mag hydroxide-simeth (MAALOX MULTI SYMPTOM MAX ST) 400-400-40 MG/5ML suspension Take 10 mLs by mouth every 6 (six) hours as needed for indigestion. 355 mL 2  . amphetamine-dextroamphetamine (ADDERALL) 10 MG tablet Take 10 mg by mouth every morning, may take 61m additional dose daily as needed for fatigue (Patient not taking: Reported on 08/23/2019) 60 tablet 0  . dexamethasone (DECADRON) 4 MG tablet Take 1 tablet (4 mg total) by mouth 2 (two) times daily with a meal. Starting on 5/11, and drop dose to once daily on 5/14 60 tablet 0  . DULoxetine (CYMBALTA) 30 MG capsule Take 1 capsule (30 mg total) by mouth daily.  30 capsule 3  . fentaNYL (DURAGESIC) 100 MCG/HR Place 1 patch onto the skin every 3 (three) days. 10 patch 0  . folic acid (FOLVITE) 1 MG tablet Take 1 tablet (1 mg total) by mouth daily. 60 tablet 3  . HYDROmorphone (DILAUDID) 8 MG tablet Take 1/2- 1 tablet every 4 hours as needed for pain 90 tablet 0  . lidocaine-prilocaine (EMLA) cream Apply 1 application topically as needed. 30 g 2  . LORazepam (ATIVAN) 1 MG tablet Take 1 tablet (1 mg total) by mouth as directed. Please take 30 to 60 minutes prior to CT scans. 1 tablet 0  . metoprolol succinate (TOPROL XL) 50 MG 24 hr tablet Take 1 tablet (50 mg total) by mouth daily. Take with or immediately following a meal. 30 tablet 11  . ondansetron (ZOFRAN) 4 MG tablet Take 1 tablet (4 mg total) by  mouth every 8 (eight) hours as needed for nausea or vomiting. 60 tablet 0  . pantoprazole (PROTONIX) 40 MG tablet Take 1 tablet (40 mg total) by mouth 2 (two) times daily. 120 tablet 3  . polyethylene glycol (MIRALAX / GLYCOLAX) 17 g packet Take 17 g by mouth daily. 14 each 0  . rivaroxaban (XARELTO) 20 MG TABS tablet Take 1 tablet (20 mg total) by mouth daily with supper. 90 tablet 1  . senna-docusate (SENOKOT-S) 8.6-50 MG tablet Take 2 tablets by mouth 2 (two) times daily. (Patient taking differently: Take 3 tablets by mouth at bedtime. ) 120 tablet 0   No current facility-administered medications for this visit.    REVIEW OF SYSTEMS:   Constitutional: ( - ) fevers, ( - )  chills , ( - ) night sweats Eyes: ( - ) blurriness of vision, ( - ) double vision, ( - ) watery eyes Ears, nose, mouth, throat, and face: ( - ) mucositis, ( - ) sore throat Respiratory: ( - ) cough, ( - ) dyspnea, ( - ) wheezes Cardiovascular: ( - ) palpitation, ( - ) chest discomfort, ( - ) lower extremity swelling Gastrointestinal:  ( - ) nausea, ( - ) heartburn, ( - ) change in bowel habits Skin: ( - ) abnormal skin rashes Lymphatics: ( - ) new lymphadenopathy, ( - ) easy  bruising Neurological: ( - ) numbness, ( - ) tingling, ( - ) new weaknesses Behavioral/Psych: ( - ) mood change, ( - ) new changes  All other systems were reviewed with the patient and are negative.  PHYSICAL EXAMINATION: ECOG PERFORMANCE STATUS: 2 - Symptomatic, <50% confined to bed  Vitals:   09/08/19 1023 09/08/19 1148  BP: (!) 139/99 (!) 139/95  Pulse: (!) 114 (!) 106  Resp: 20 16  Temp: (!) 96.6 F (35.9 C) (!) 97 F (36.1 C)  SpO2: 100% 100%   Filed Weights   09/08/19 1023  Weight: 208 lb 1.6 oz (94.4 kg)    GENERAL: well appearing young African American male in NAD  SKIN: skin color, texture, turgor are normal, no rashes or significant lesions NECK: supple, nontender LYMPH: fullness of the cervical lymph nodes bilaterally. No clear axillary lymph nodes. Stable from prior EYES: conjunctiva are pink and non-injected, sclera clear LUNGS: clear to auscultation and percussion with normal breathing effort HEART: regular rate & rhythm and no murmurs and no lower extremity edema Musculoskeletal: no cyanosis of digits and no clubbing. Increased swelling inferior to axilla bilaterally. Also noted to have soft tissue swelling in left leg  PSYCH: alert & oriented x 3, fluent speech NEURO: no focal motor/sensory deficits  LABORATORY DATA:  I have reviewed the data as listed CBC Latest Ref Rng & Units 09/08/2019 08/24/2019 08/16/2019  WBC 4.0 - 10.5 K/uL 2.9(L) 3.8(L) 7.1  Hemoglobin 13.0 - 17.0 g/dL 9.5(L) 9.7(L) 9.9(L)  Hematocrit 39 - 52 % 31.0(L) 31.6(L) 31.5(L)  Platelets 150 - 400 K/uL 87(L) 100(L) 83(L)    CMP Latest Ref Rng & Units 09/08/2019 08/24/2019 08/16/2019  Glucose 70 - 99 mg/dL 225(H) 152(H) 135(H)  BUN 6 - 20 mg/dL 22(H) 22(H) 27(H)  Creatinine 0.61 - 1.24 mg/dL 1.22 1.24 1.19  Sodium 135 - 145 mmol/L 141 142 139  Potassium 3.5 - 5.1 mmol/L 3.8 4.2 4.6  Chloride 98 - 111 mmol/L 107 109 107  CO2 22 - 32 mmol/L 20(L) 23 21(L)  Calcium 8.9 - 10.3 mg/dL 8.9 9.8 9.2   Total Protein  6.5 - 8.1 g/dL 6.8 7.0 7.0  Total Bilirubin 0.3 - 1.2 mg/dL 0.4 0.3 0.3  Alkaline Phos 38 - 126 U/L 452(H) 505(H) 446(H)  AST 15 - 41 U/L 44(H) 51(H) 48(H)  ALT 0 - 44 U/L 130(H) 165(H) 136(H)    RADIOGRAPHIC STUDIES: MR Brain W Wo Contrast  Result Date: 09/06/2019 CLINICAL DATA:  34 year old male with widely metastatic lung cancer. Status post whole brain radiation in May. Restaging. EXAM: MRI HEAD WITHOUT AND WITH CONTRAST TECHNIQUE: Multiplanar, multiecho pulse sequences of the brain and surrounding structures were obtained without and with intravenous contrast. CONTRAST:  9.11m GADAVIST GADOBUTROL 1 MMOL/ML IV SOLN COMPARISON:  Brain and orbit MRI 05/10/2019.  Head CT 06/17/2019. FINDINGS: Brain: This is the first 3 Tesla MRI of the brain on this patient. Postcontrast images demonstrate greater than 35 small enhancing brain metastases ranging from punctate 2 11 mm diameter. Notable lesions include the posteromedial right parietal lobe on series 10, image 35, superior left thalamus abutting the body of the lateral ventricle on series 11, image 18, right dorsal midbrain at the superior colliculus on series 10, image 21, cerebellar vermis on series 10, image 11. Despite the large number of enhancing metastases there is only mild scattered cerebral edema associated with the largest lesions. No intracranial mass effect or midline shift. Many of the metastases also now demonstrate microhemorrhage on SWI which is new since April and likely a post radiation effect. No abnormal diffusion suggestive of ischemia or infarct. No ventriculomegaly. No acute intracranial hemorrhage. No dural or leptomeningeal thickening identified. Negative pituitary and cervicomedullary junction. Vascular: Major intracranial vascular flow voids are stable. The major dural venous sinuses are enhancing and appear to be patent. Skull and upper cervical spine: Cervical spine is reported separately today. Confluent bone  metastasis throughout the clivus and the right occipital condyle is noted with superimposed inspissated T1 hyperintense secretions in the sphenoid sinuses. Sinuses/Orbits: Negative orbits. Inspissated secretions in the sphenoid sinuses. Other: Mild bilateral mastoid effusions. Grossly normal visible internal auditory structures. Scalp and face appear negative. IMPRESSION: 1. Status post whole brain radiation in May with a large number (>35) of small enhancing brain metastases on this first 3 Tesla Brain study for this patient. Brain mets range from punctate to 11 mm diameter. Many demonstrate micro-hemorrhage which is likely the sequelae of radiation. But only small areas of edema from the largest lesions, and no intracranial mass effect. 2. Confluent bone metastasis throughout the clivus and at the right occipital condyle. Superimposed inspissated T1 hyperintense secretions in the sphenoid sinuses. 3. Cervical spine MRI reported separately today. Electronically Signed   By: HGenevie AnnM.D.   On: 09/06/2019 22:16   MR CERVICAL SPINE WO CONTRAST  Result Date: 09/06/2019 CLINICAL DATA:  34year old male with widely metastatic lung cancer. Restaging. EXAM: MRI CERVICAL SPINE WITHOUT CONTRAST TECHNIQUE: Multiplanar, multisequence MR imaging of the cervical spine was performed. No intravenous contrast was administered. COMPARISON:  Cervical spine MRI 06/17/2019. FINDINGS: Alignment: Chronic straightening of lumbar lordosis, mild reversal compared to May. Vertebrae: Widespread T1 hypointense bone metastases throughout the visible spine, and confluent involvement at the skull base including the entire clivus and right occipital condyle. In the cervical spine only the C1 vertebra appears spared. Extensive vertebral body and posterior element involvement elsewhere. The C3 body is relatively spared. No cervical vertebral pathologic fracture. There is evidence of spinous process extraosseous extension of tumor at C2, C3, C6  (series 3, images 10 and 11). No cervical spine epidural  tumor identified. Confluent upper thoracic metastases at T1 (with some spinous process extraosseous tumor) and the posterior T2 body. The T3 pathologic fracture seen in May is not included today. Cord: Normal noncontrast MRI appearance of the cervical spinal cord. The visible T1 and T2 level cord also appears normal. Posterior Fossa, vertebral arteries, paraspinal tissues: Negative visible posterior fossa, brain parenchyma. Partially visible mastoid and paranasal sinus fluid. Preserved major vascular flow voids in the neck, the right vertebral artery appears dominant as before. Disc levels: Stable small cervical disc protrusions at C3-C4 and C4-C5 without spinal stenosis. IMPRESSION: 1. Widespread osseous metastatic disease throughout the visible skull base and cervical spine, not significantly changed since May - although there does appear to be new extraosseous extension of tumor from several spinous processes. 2. There is no cervical spine epidural tumor or cord compression. Note that the T3 pathologic fracture seen in May is not included today. Electronically Signed   By: Genevie Ann M.D.   On: 09/06/2019 22:01    ASSESSMENT & PLAN Kymari Isidoro Donning 34 y.o. male with medical history significant for metastatic adenocarcinoma of the lung who presents for a follow up visit prior to Cycle 5 of chemotherapy.   The patient successfully completed Cycle 1 of carboplatin and pemetrexed, started on 06/23/2019.  Unfortunately this had to be administered in the inpatient setting because the patient was admitted for pain control.  As we were unsure when he would be discharged we decided to move forward with the treatment at that time.  Pembrolizumab was not able to be administered in house due to logistical and insurance issues.  He received pembrolizumab during Cycle 2 which started on 07/13/2019.   On exam today Wafer is having worsening pain, but is otherwise  stable.  His weight is stable and his appetite is been good.  He is not having any other concerning symptoms at this time.  His blood counts have had some difficulty rebounding, though this appears in line with his prior cycles.  Assuming his scans on 09/12/2019 show no evidence of progression he is clear for treatment on 09/14/2019.  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 This will give [place] therapeutic options later in his treatment course. Previously I made it clear to Mr. Toruno that all treatments for administering are palliative in nature and designed to shrink the tumor and reduce symptoms.  The goal is to extend life and provide him some quality of life.  Once again we reiterated that there is no cure for his disease and that all steps moving forward should be made with this in mind.  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 Pathology could not definitively say, but he had a prior nodule on CXR and mass in lung appears to be the primary lesion -- Guardant 360 with actionable EGFR mutation noted.  --started Carbo/Pem/Pem (regimen detailed above) Cycle 1 Day 1 on 06/23/2019. Cycle 1 administered inpatient, pembrolizumab was not able to be added at that time. Cycle 2 (07/13/2019) included pembroliuzmab, which will be included in all subsequent cycles.  --repeat CT scan due to be performed next week, prior to Cycle 5 of chemotherapy.  --pain control regimen as listed below   --RTC prior to Day 1 Cycle 6 ( 10/05/2019) or sooner if he is noted to have progression on his  next scan.   #Facial Swelling, stable #Cervical Fullness/Lymphadenopathy, stable --bilateral facial swelling, most pronounced in the neck bilaterally. Some lid edema and facial fullness with no marked increase in arm swelling --no  worsening abdominal or LE swelling. No difficultly breathing or swallowing.  --on exam today the cervical regions bilaterally felt stable.   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 and radiation oncology are following, Appreciate assistance in management of brain mets.  --pain control as noted below.  #Pain Control, worsening --pain is predominately in the back and abdomen. Of note, headaches have resloved --patient is taking dexamethasone 102m PO daily  --increase fentanyl patches to 100 mcg q 72H due to worsening pain --dilaudid PO [id number] PRN (usage increasing) --additionally is taking cymablata 3013mO and celebrex 200m52mily  --senna docusate/PRN miralax for opioid induced constipation  #Osseus Lesions/Hip Pain --dilaudid as above --encourage dental evaluation (has yet to be done) --continue IV zometa therapy for bone protection. Started 07/13/2019, to continue q 3 months. Next due Sept 2021.   #Left Eye Vision Changes --[facility] vs occipital lobe lesion --currently using eye patch over left eye.  --continue to monitor   # Weight Loss, stable --encourage high calorie diet --possible increase appetite and weight from steroid therapy --continue to monitor  #Pulmonary Embolism --continue Xarelto 20mg60mdaily   No orders of the defined types were placed in this encounter.  All questions were answered. The patient knows to call the clinic with any problems, questions or concerns.  A total of more than 30 minutes were spent on this encounter and over half of that time was spent on counseling and coordination of care as outlined above.   Debie Ashline Ledell PeoplesDepartment of Hematology/Oncology [facility]: [phone]:  [phone]: [email or web address]  09/08/2019 1:02 PM

## 2019-09-08 ENCOUNTER — Inpatient Hospital Stay: Payer: PRIVATE HEALTH INSURANCE

## 2019-09-08 ENCOUNTER — Encounter: Payer: Self-pay | Admitting: Hematology and Oncology

## 2019-09-08 ENCOUNTER — Other Ambulatory Visit: Payer: Self-pay | Admitting: Hematology and Oncology

## 2019-09-08 ENCOUNTER — Other Ambulatory Visit: Payer: Self-pay

## 2019-09-08 ENCOUNTER — Inpatient Hospital Stay: Payer: PRIVATE HEALTH INSURANCE | Attending: Hematology and Oncology | Admitting: Hematology and Oncology

## 2019-09-08 VITALS — BP 139/95 | HR 106 | Temp 97.0°F | Resp 16 | Ht 68.0 in | Wt 208.1 lb

## 2019-09-08 DIAGNOSIS — Z79899 Other long term (current) drug therapy: Secondary | ICD-10-CM | POA: Diagnosis not present

## 2019-09-08 DIAGNOSIS — Z5111 Encounter for antineoplastic chemotherapy: Secondary | ICD-10-CM | POA: Diagnosis present

## 2019-09-08 DIAGNOSIS — C787 Secondary malignant neoplasm of liver and intrahepatic bile duct: Secondary | ICD-10-CM | POA: Diagnosis not present

## 2019-09-08 DIAGNOSIS — C7949 Secondary malignant neoplasm of other parts of nervous system: Secondary | ICD-10-CM | POA: Diagnosis not present

## 2019-09-08 DIAGNOSIS — C7951 Secondary malignant neoplasm of bone: Secondary | ICD-10-CM | POA: Diagnosis not present

## 2019-09-08 DIAGNOSIS — R220 Localized swelling, mass and lump, head: Secondary | ICD-10-CM | POA: Diagnosis not present

## 2019-09-08 DIAGNOSIS — C3491 Malignant neoplasm of unspecified part of right bronchus or lung: Secondary | ICD-10-CM

## 2019-09-08 DIAGNOSIS — C7931 Secondary malignant neoplasm of brain: Secondary | ICD-10-CM

## 2019-09-08 DIAGNOSIS — G893 Neoplasm related pain (acute) (chronic): Secondary | ICD-10-CM | POA: Insufficient documentation

## 2019-09-08 DIAGNOSIS — Z95828 Presence of other vascular implants and grafts: Secondary | ICD-10-CM

## 2019-09-08 DIAGNOSIS — Z86711 Personal history of pulmonary embolism: Secondary | ICD-10-CM | POA: Insufficient documentation

## 2019-09-08 DIAGNOSIS — Z7901 Long term (current) use of anticoagulants: Secondary | ICD-10-CM | POA: Insufficient documentation

## 2019-09-08 LAB — CBC WITH DIFFERENTIAL (CANCER CENTER ONLY)
Abs Immature Granulocytes: 0.08 10*3/uL — ABNORMAL HIGH (ref 0.00–0.07)
Basophils Absolute: 0 10*3/uL (ref 0.0–0.1)
Basophils Relative: 0 %
Eosinophils Absolute: 0 10*3/uL (ref 0.0–0.5)
Eosinophils Relative: 0 %
HCT: 31 % — ABNORMAL LOW (ref 39.0–52.0)
Hemoglobin: 9.5 g/dL — ABNORMAL LOW (ref 13.0–17.0)
Immature Granulocytes: 3 %
Lymphocytes Relative: 9 %
Lymphs Abs: 0.3 10*3/uL — ABNORMAL LOW (ref 0.7–4.0)
MCH: 29.8 pg (ref 26.0–34.0)
MCHC: 30.6 g/dL (ref 30.0–36.0)
MCV: 97.2 fL (ref 80.0–100.0)
Monocytes Absolute: 0.6 10*3/uL (ref 0.1–1.0)
Monocytes Relative: 20 %
Neutro Abs: 2 10*3/uL (ref 1.7–7.7)
Neutrophils Relative %: 68 %
Platelet Count: 87 10*3/uL — ABNORMAL LOW (ref 150–400)
RBC: 3.19 MIL/uL — ABNORMAL LOW (ref 4.22–5.81)
RDW: 19.9 % — ABNORMAL HIGH (ref 11.5–15.5)
WBC Count: 2.9 10*3/uL — ABNORMAL LOW (ref 4.0–10.5)
nRBC: 6.6 % — ABNORMAL HIGH (ref 0.0–0.2)

## 2019-09-08 LAB — CMP (CANCER CENTER ONLY)
ALT: 130 U/L — ABNORMAL HIGH (ref 0–44)
AST: 44 U/L — ABNORMAL HIGH (ref 15–41)
Albumin: 3.3 g/dL — ABNORMAL LOW (ref 3.5–5.0)
Alkaline Phosphatase: 452 U/L — ABNORMAL HIGH (ref 38–126)
Anion gap: 14 (ref 5–15)
BUN: 22 mg/dL — ABNORMAL HIGH (ref 6–20)
CO2: 20 mmol/L — ABNORMAL LOW (ref 22–32)
Calcium: 8.9 mg/dL (ref 8.9–10.3)
Chloride: 107 mmol/L (ref 98–111)
Creatinine: 1.22 mg/dL (ref 0.61–1.24)
GFR, Est AFR Am: >60 mL/min (ref >60)
GFR, Estimated: >60 mL/min (ref >60)
Glucose, Bld: 225 mg/dL — ABNORMAL HIGH (ref 70–99)
Potassium: 3.8 mmol/L (ref 3.5–5.1)
Sodium: 141 mmol/L (ref 135–145)
Total Bilirubin: 0.4 mg/dL (ref 0.3–1.2)
Total Protein: 6.8 g/dL (ref 6.5–8.1)

## 2019-09-08 LAB — TSH: TSH: 2.516 u[IU]/mL (ref 0.320–4.118)

## 2019-09-08 MED ORDER — HYDROMORPHONE HCL 1 MG/ML IJ SOLN
2.0000 mg | Freq: Once | INTRAMUSCULAR | Status: AC
  Administered 2019-09-08: 2 mg via INTRAVENOUS

## 2019-09-08 MED ORDER — SODIUM CHLORIDE 0.9% FLUSH
10.0000 mL | INTRAVENOUS | Status: DC | PRN
  Administered 2019-09-08: 10 mL via INTRAVENOUS
  Filled 2019-09-08: qty 10

## 2019-09-08 MED ORDER — FENTANYL 100 MCG/HR TD PT72: 1.0000 | MEDICATED_PATCH | TRANSDERMAL | 0 refills | Status: DC

## 2019-09-08 MED ORDER — HEPARIN SOD (PORK) LOCK FLUSH 100 UNIT/ML IV SOLN
500.0000 [IU] | Freq: Once | INTRAVENOUS | Status: AC
  Administered 2019-09-08: 500 [IU] via INTRAVENOUS
  Filled 2019-09-08: qty 5

## 2019-09-08 MED ORDER — SODIUM CHLORIDE 0.9% FLUSH
10.0000 mL | Freq: Once | INTRAVENOUS | Status: AC
  Administered 2019-09-08: 10 mL via INTRAVENOUS
  Filled 2019-09-08: qty 10

## 2019-09-08 MED ORDER — PANTOPRAZOLE SODIUM 40 MG PO TBEC: 40.0000 mg | DELAYED_RELEASE_TABLET | Freq: Two times a day (BID) | ORAL | 3 refills | Status: DC

## 2019-09-08 MED ORDER — RIVAROXABAN 20 MG PO TABS: 20.0000 mg | ORAL_TABLET | Freq: Every day | ORAL | 1 refills | Status: AC

## 2019-09-08 MED ORDER — HYDROMORPHONE HCL 1 MG/ML IJ SOLN
INTRAMUSCULAR | Status: AC
  Filled 2019-09-08: qty 2

## 2019-09-08 MED ORDER — SODIUM CHLORIDE 0.9 % IV SOLN
Freq: Once | INTRAVENOUS | Status: AC
  Filled 2019-09-08: qty 250

## 2019-09-08 NOTE — Patient Instructions (Signed)

## 2019-09-09 LAB — T4: T4, Total: 7.7 ug/dL (ref 4.5–12.0)

## 2019-09-11 ENCOUNTER — Telehealth: Payer: Self-pay | Admitting: Hematology and Oncology

## 2019-09-11 ENCOUNTER — Inpatient Hospital Stay: Payer: PRIVATE HEALTH INSURANCE

## 2019-09-11 NOTE — Telephone Encounter (Signed)
Scheduled per los. Called and left msg. Mailed printout  °

## 2019-09-12 ENCOUNTER — Other Ambulatory Visit: Payer: Self-pay

## 2019-09-12 ENCOUNTER — Ambulatory Visit (HOSPITAL_COMMUNITY)
Admission: RE | Admit: 2019-09-12 | Discharge: 2019-09-12 | Disposition: A | Discharge status: Home / Self Care | Payer: PRIVATE HEALTH INSURANCE | Source: Ambulatory Visit | Attending: Hematology and Oncology | Admitting: Hematology and Oncology

## 2019-09-12 ENCOUNTER — Encounter (HOSPITAL_COMMUNITY): Payer: Self-pay

## 2019-09-12 DIAGNOSIS — C3491 Malignant neoplasm of unspecified part of right bronchus or lung: Secondary | ICD-10-CM | POA: Insufficient documentation

## 2019-09-12 DIAGNOSIS — C3490 Malignant neoplasm of unspecified part of unspecified bronchus or lung: Secondary | ICD-10-CM | POA: Diagnosis not present

## 2019-09-12 IMAGING — CT CT ABD-PELV W/ CM
2 of 4 series · 14 of 36 positions shown, 17 images · IV contrast (OMNIPAQUE)
Comparison: CTA chest and CT abdomen/pelvis dated [DATE]. MRI
thoracolumbar spine dated [DATE].

CLINICAL DATA: Metastatic lung cancer, chemotherapy/immunotherapy
ongoing, XRT complete [DATE]. Mid chest pain, hemoptysis, weight
loss. Right upper quadrant abdominal pain, constipation.

EXAM:
CT CHEST, ABDOMEN, AND PELVIS WITH CONTRAST
TECHNIQUE: Multidetector CT imaging of the chest, abdomen and pelvis was
performed following the standard protocol during bolus
administration of intravenous contrast.
CONTRAST:  100mL OMNIPAQUE IOHEXOL 300 MG/ML  SOLN

[Series 2: cap with · axial · 0.98mm/px · z∈[-761,-206]mm · 11 of 133 slices shown, 14 images]
[im 11/133  mediastinal]
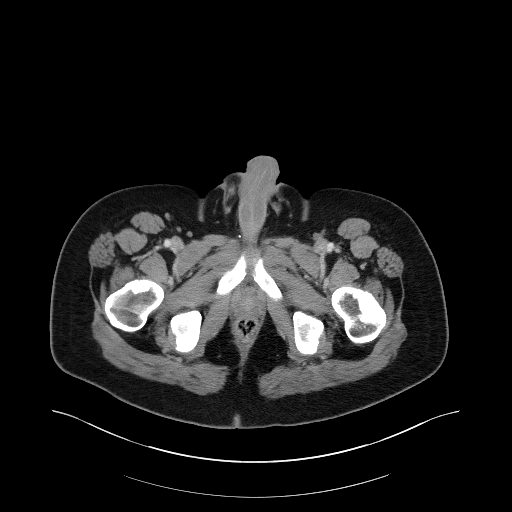
[im 11/133  lung]
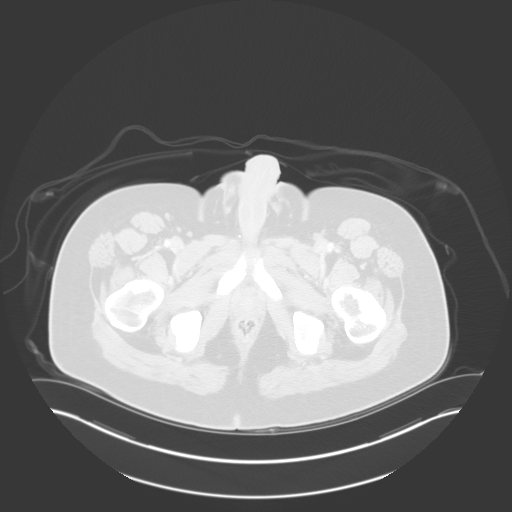
[im 21/133  lung]
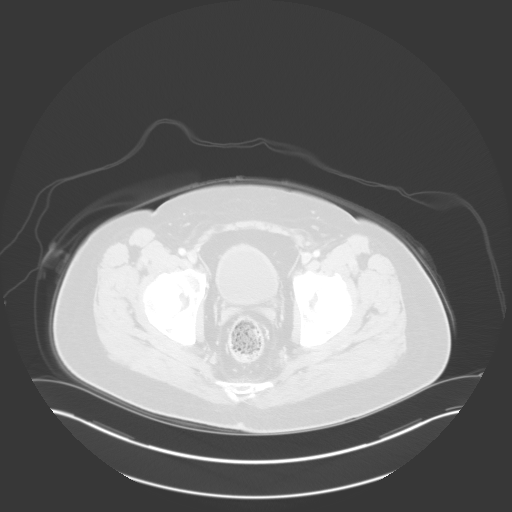
[im 31/133  lung]
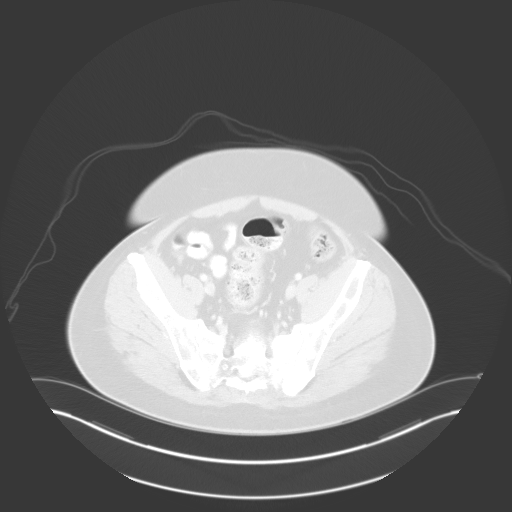
[im 41/133  lung]
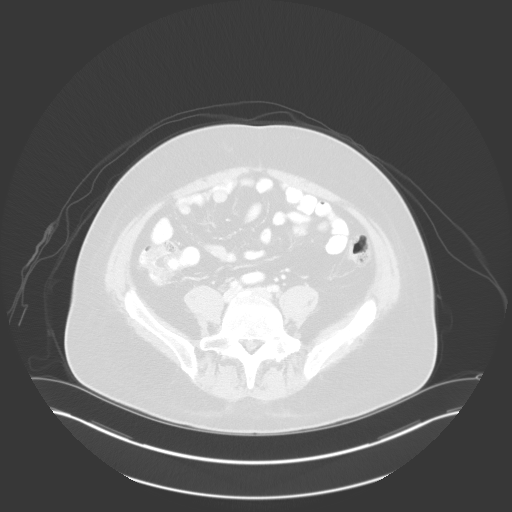
[im 51/133  mediastinal]
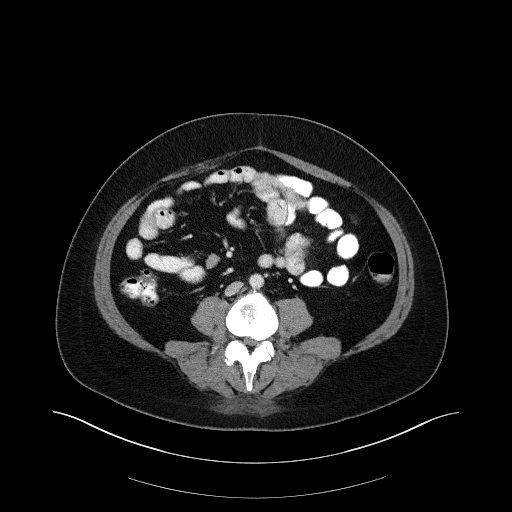
[im 51/133  lung]
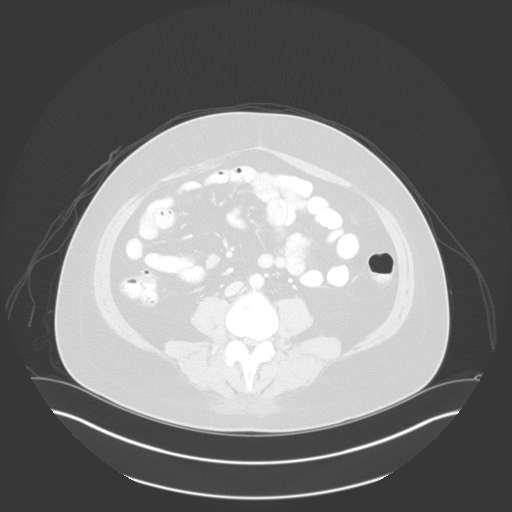
[im 72/133  lung]
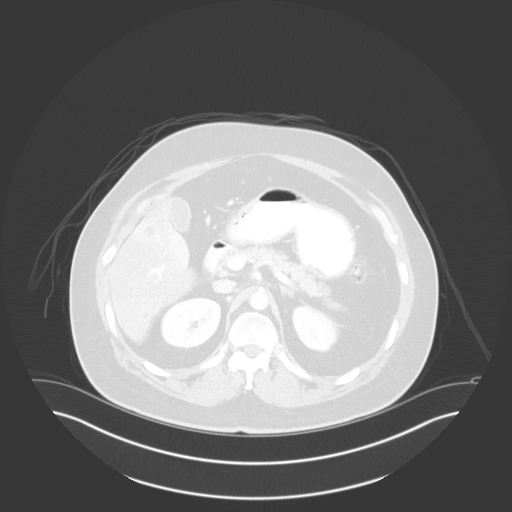
[im 82/133  lung]
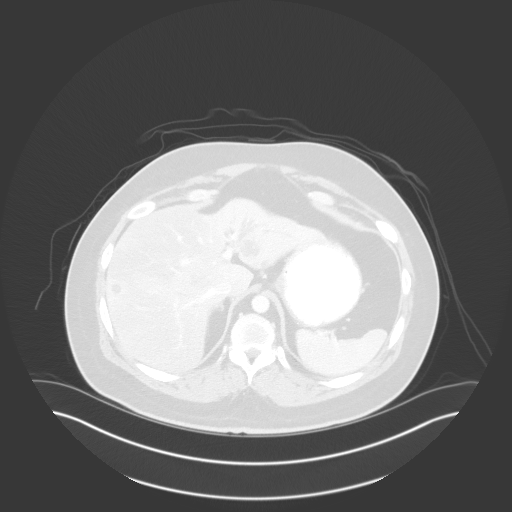
[im 92/133  lung]
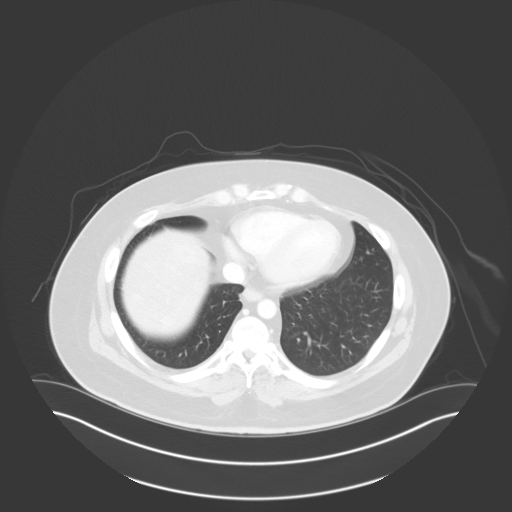
[im 102/133  mediastinal]
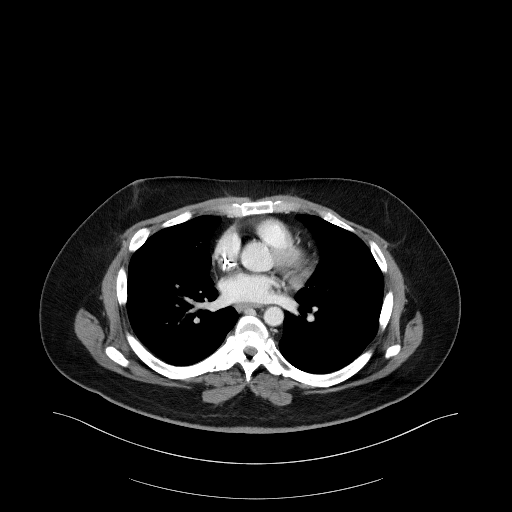
[im 102/133  lung]
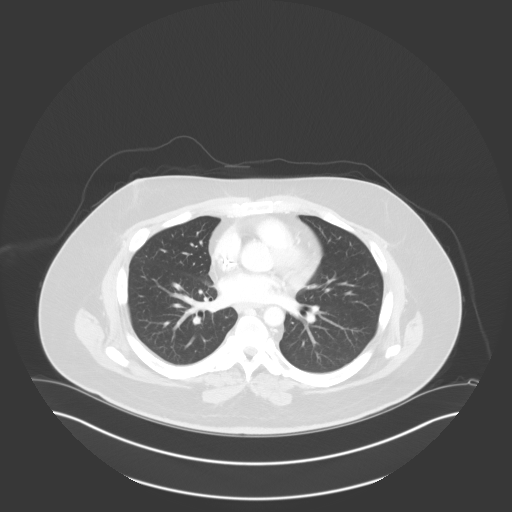
[im 112/133  lung]
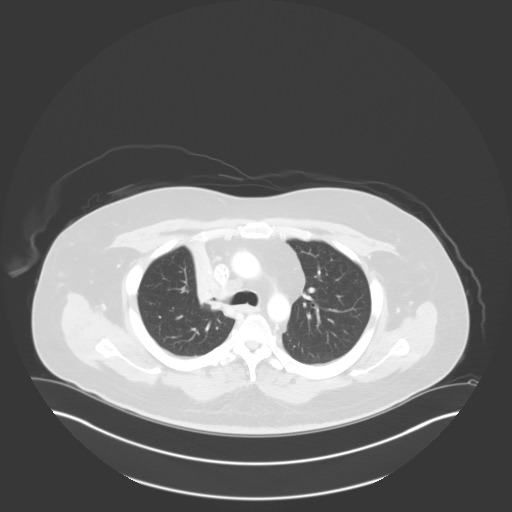
[im 122/133  lung]
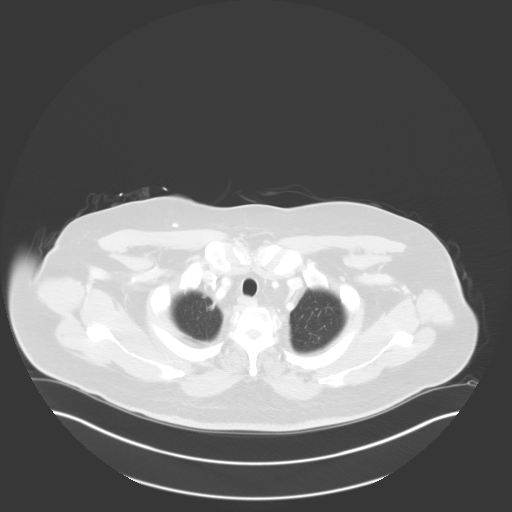

[Series 5: coronals · coronal · 1.29mm/px · 3 of 168 slices shown]
[im 34/168  lung]
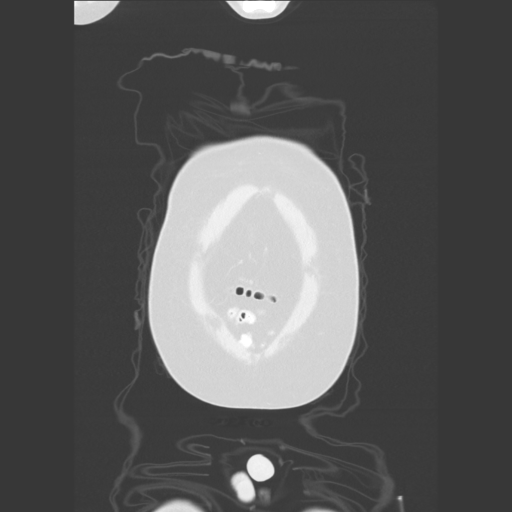
[im 67/168  lung]
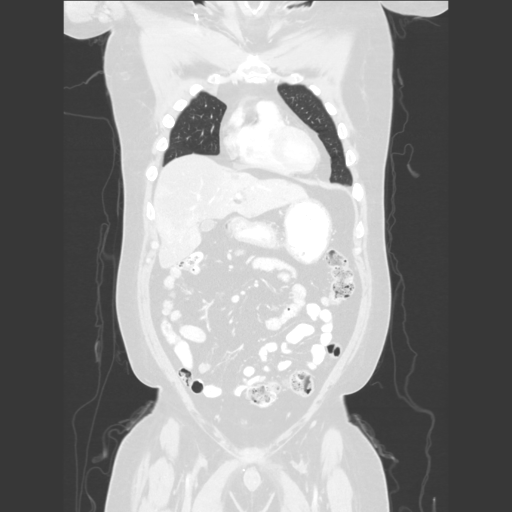
[im 101/168  lung]
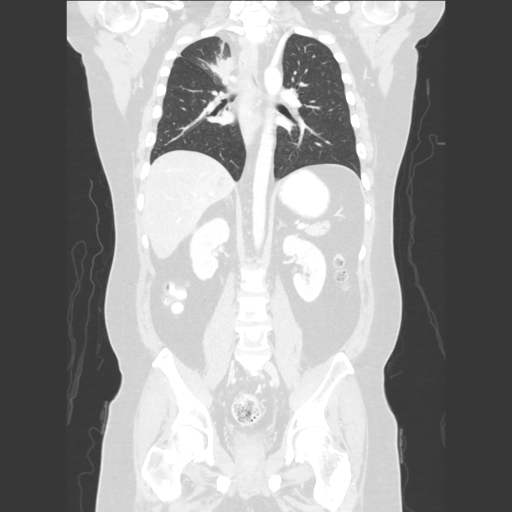

[14 of 36 positions shown; findings below may reference images not displayed]

FINDINGS: CT CHEST FINDINGS

Cardiovascular: Heart is normal in size.  No pericardial effusion.

No evidence of thoracic aortic aneurysm.

Right chest port terminates the cavoatrial junction.

Mediastinum/Nodes: Improving thoracic lymphadenopathy. 10 mm short
axis right paratracheal node (series 2/image 22), previously 16 mm.
15 mm short axis right hilar node (series 2/image 24), previously 25
mm.

No suspicious axillary lymphadenopathy.

Visualized thyroid is unremarkable.

Lungs/Pleura: Suspected radiation changes in the medial right upper
lobe (series 4/image 44). Given the residual masslike appearance
(series 2/image 18), underlying viable tumor cannot be entirely
excluded on this baseline post treatment evaluation.

Lungs are otherwise clear.

No pleural effusion or pneumothorax.

Musculoskeletal: Multifocal sclerotic osseous metastases throughout
the visualized axial and appendicular skeleton. This is certainly
more conspicuous than on prior CT, although the overall appearance
is unchanged from MR, and may simply reflect treatment related
sclerosis.

Moderate compression fracture deformity at T3 (sagittal image 100),
minimally progressive. No retropulsion.

CT ABDOMEN PELVIS FINDINGS

Hepatobiliary: Numerous hepatic metastases in both lobes,
approximately 30 in number. Index lesions include:

--2.3 cm lesion in the medial aspect of the posterior right hepatic
lobe (series 2/image 49), previously 2.9 cm

--2.2 cm lesion in the central left hepatic lobe (series 2/image
48), previously 2.2 cm

--2.6 cm lesion inferiorly in the posterior right hepatic lobe
(series 2/image 67), previously 3.1 cm

Overall, mild improvement is suspected.

Gallbladder is unremarkable. No intrahepatic or extrahepatic ductal
dilatation.

Pancreas: Within normal limits.

Spleen: Normal limits.

Adrenals/Urinary Tract: Adrenal glands are within normal limits.

Subcentimeter right renal cyst (series 2/image 65). Left kidney is
within normal limits. No hydronephrosis.

Bladder is mildly thick-walled although underdistended.

Stomach/Bowel: Stomach is within normal limits.

No evidence of bowel obstruction.

Normal appendix (series 2/image 103).

Vascular/Lymphatic: No evidence of abdominal aortic aneurysm.

No suspicious abdominopelvic lymphadenopathy.

Reproductive: Prostate is unremarkable.

Other: No abdominopelvic ascites.

Musculoskeletal: Multifocal sclerotic osseous metastases throughout
the visualized axial and appendicular skeleton. This is certainly
more conspicuous than on prior CT, although the overall appearance
is unchanged from MR, and may simply reflect treatment related
sclerosis.

Mild superior endplate compression fracture deformity at L2
(sagittal image 96), unchanged, without retropulsion.
IMPRESSION: Radiation changes in the medial right upper lobe. Underlying viable
tumor is difficult to exclude. Continued attention on follow-up is
suggested.

Mild thoracic lymphadenopathy, improved.

Multifocal hepatic metastases, mildly improved.

Multifocal osseous metastases throughout the visualized axial and
appendicular skeleton, more conspicuous on CT but unchanged from
prior MR, favoring treatment related sclerosis. Associated
pathologic fracture at T3 and L2, unchanged.

## 2019-09-12 IMAGING — CT CT CHEST W/ CM
2 of 4 series · 14 of 36 positions shown, 17 images · IV contrast (OMNIPAQUE)
Comparison: CTA chest and CT abdomen/pelvis dated [DATE]. MRI
thoracolumbar spine dated [DATE].

CLINICAL DATA: Metastatic lung cancer, chemotherapy/immunotherapy
ongoing, XRT complete [DATE]. Mid chest pain, hemoptysis, weight
loss. Right upper quadrant abdominal pain, constipation.

EXAM:
CT CHEST, ABDOMEN, AND PELVIS WITH CONTRAST
TECHNIQUE: Multidetector CT imaging of the chest, abdomen and pelvis was
performed following the standard protocol during bolus
administration of intravenous contrast.
CONTRAST:  100mL OMNIPAQUE IOHEXOL 300 MG/ML  SOLN

[Series 2: cap with · axial · 0.98mm/px · z∈[-761,-206]mm · 11 of 133 slices shown, 14 images]
[im 11/133  mediastinal]
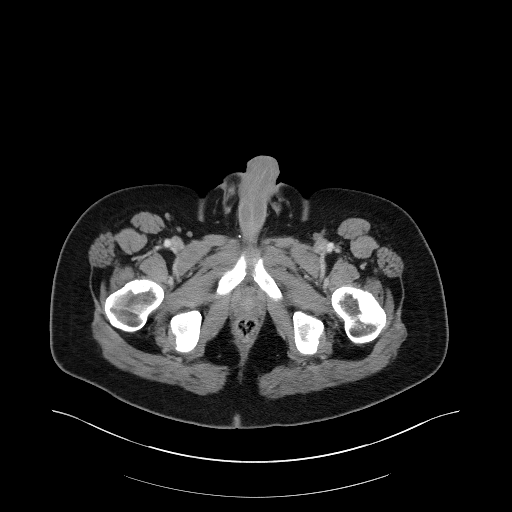
[im 11/133  lung]
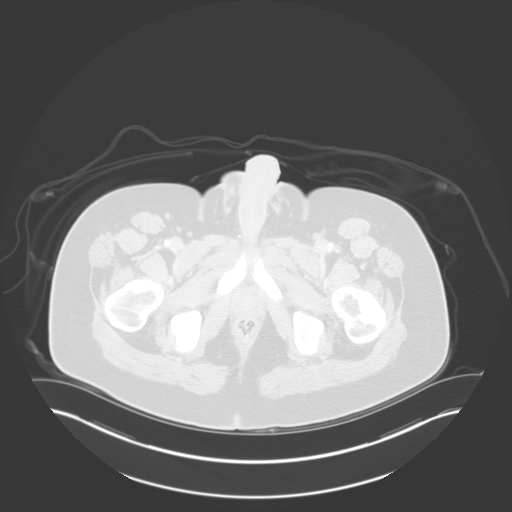
[im 21/133  lung]
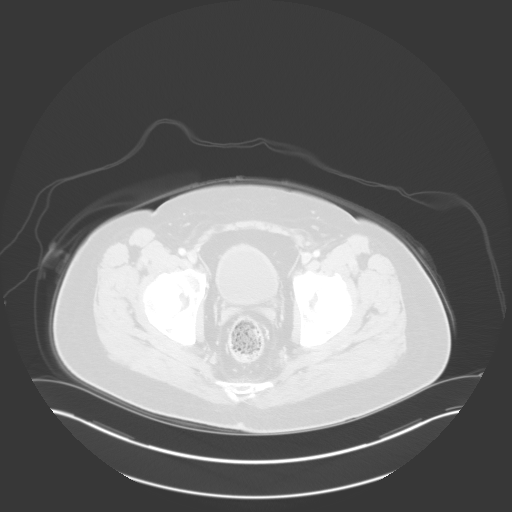
[im 31/133  lung]
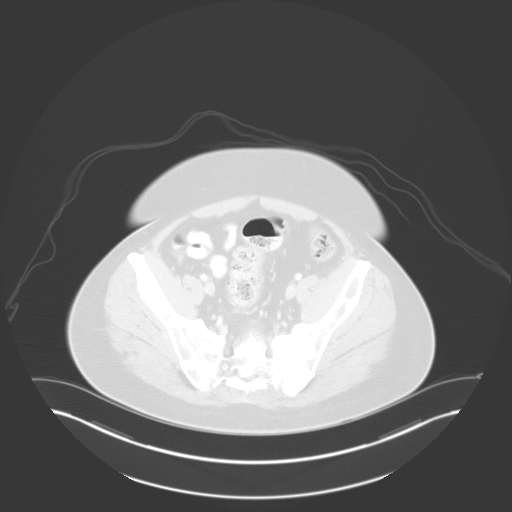
[im 41/133  lung]
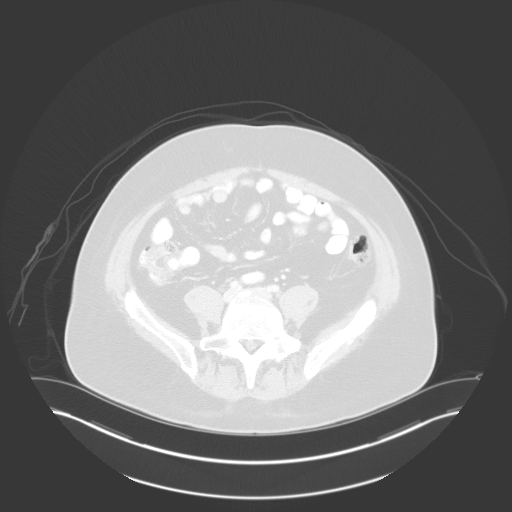
[im 51/133  mediastinal]
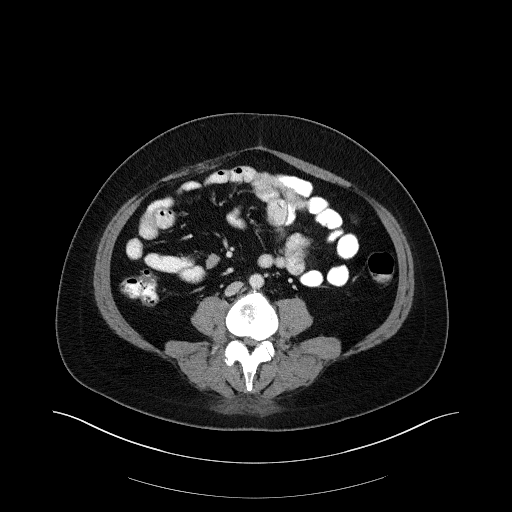
[im 51/133  lung]
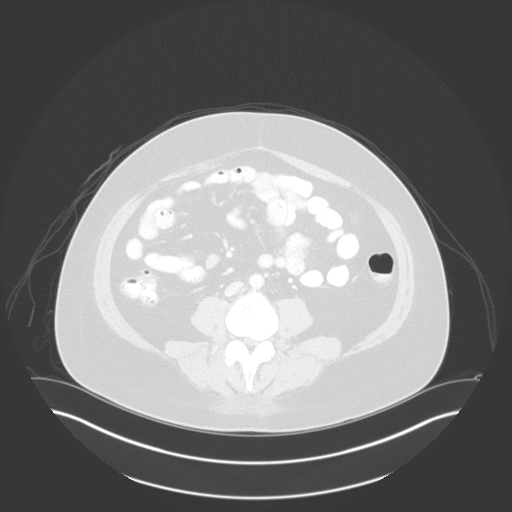
[im 72/133  lung]
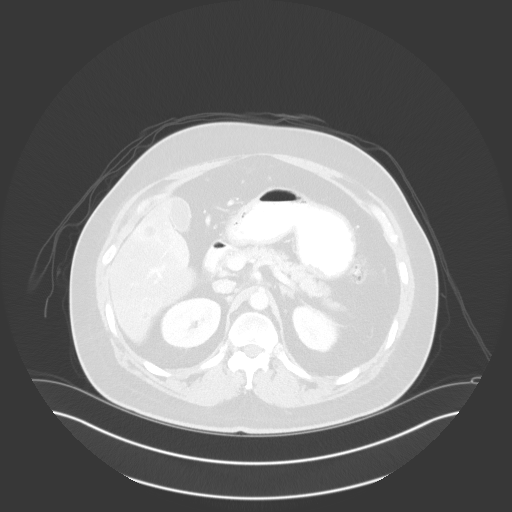
[im 82/133  lung]
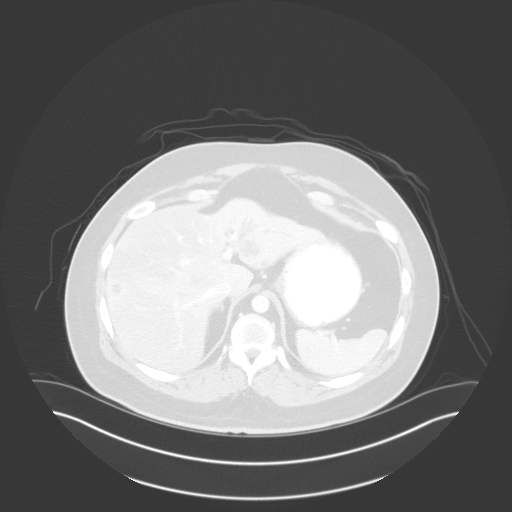
[im 92/133  lung]
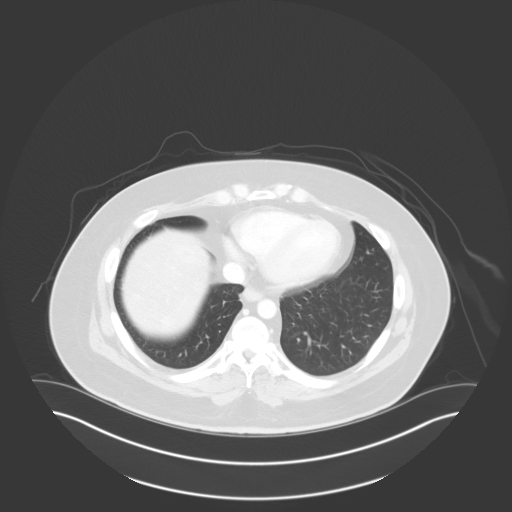
[im 102/133  mediastinal]
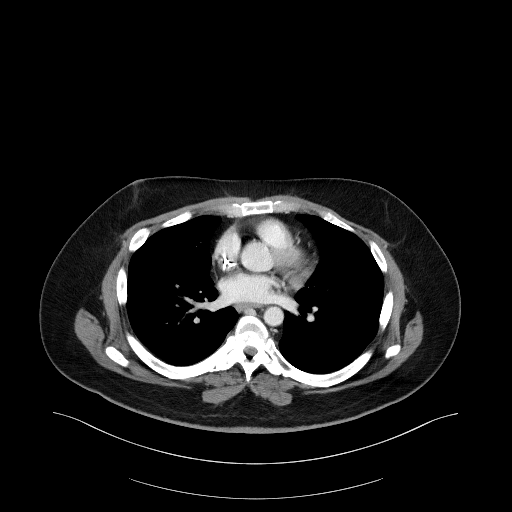
[im 102/133  lung]
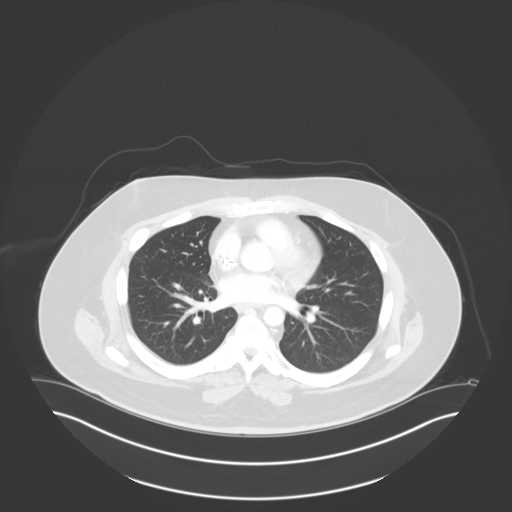
[im 112/133  lung]
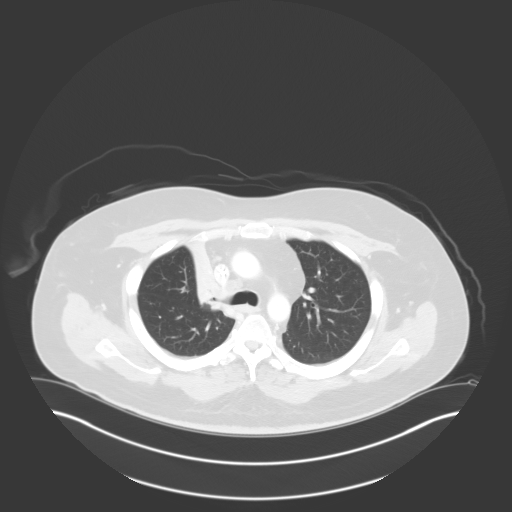
[im 122/133  lung]
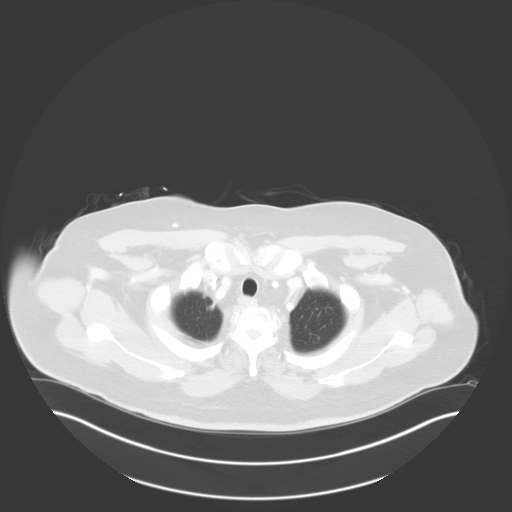

[Series 5: coronals · coronal · 1.29mm/px · 3 of 168 slices shown]
[im 34/168  lung]
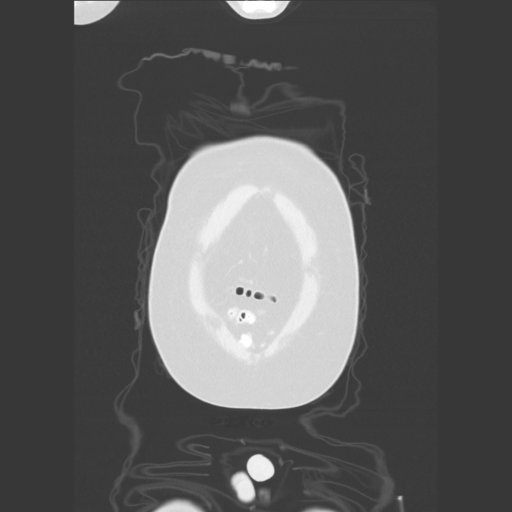
[im 67/168  lung]
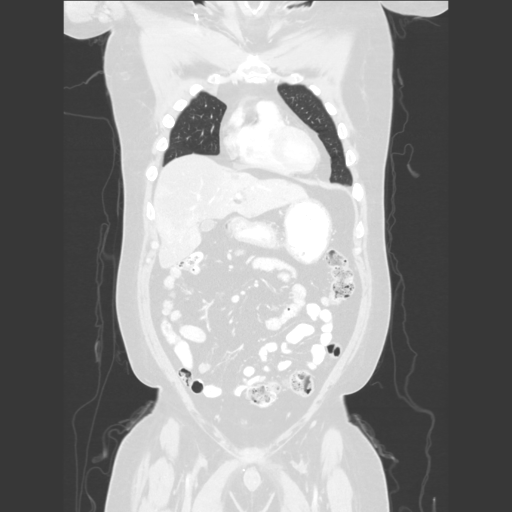
[im 101/168  lung]
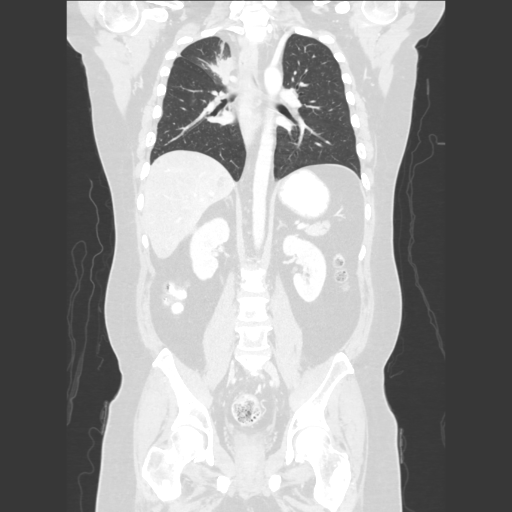

[14 of 36 positions shown; findings below may reference images not displayed]

FINDINGS: CT CHEST FINDINGS

Cardiovascular: Heart is normal in size.  No pericardial effusion.

No evidence of thoracic aortic aneurysm.

Right chest port terminates the cavoatrial junction.

Mediastinum/Nodes: Improving thoracic lymphadenopathy. 10 mm short
axis right paratracheal node (series 2/image 22), previously 16 mm.
15 mm short axis right hilar node (series 2/image 24), previously 25
mm.

No suspicious axillary lymphadenopathy.

Visualized thyroid is unremarkable.

Lungs/Pleura: Suspected radiation changes in the medial right upper
lobe (series 4/image 44). Given the residual masslike appearance
(series 2/image 18), underlying viable tumor cannot be entirely
excluded on this baseline post treatment evaluation.

Lungs are otherwise clear.

No pleural effusion or pneumothorax.

Musculoskeletal: Multifocal sclerotic osseous metastases throughout
the visualized axial and appendicular skeleton. This is certainly
more conspicuous than on prior CT, although the overall appearance
is unchanged from MR, and may simply reflect treatment related
sclerosis.

Moderate compression fracture deformity at T3 (sagittal image 100),
minimally progressive. No retropulsion.

CT ABDOMEN PELVIS FINDINGS

Hepatobiliary: Numerous hepatic metastases in both lobes,
approximately 30 in number. Index lesions include:

--2.3 cm lesion in the medial aspect of the posterior right hepatic
lobe (series 2/image 49), previously 2.9 cm

--2.2 cm lesion in the central left hepatic lobe (series 2/image
48), previously 2.2 cm

--2.6 cm lesion inferiorly in the posterior right hepatic lobe
(series 2/image 67), previously 3.1 cm

Overall, mild improvement is suspected.

Gallbladder is unremarkable. No intrahepatic or extrahepatic ductal
dilatation.

Pancreas: Within normal limits.

Spleen: Normal limits.

Adrenals/Urinary Tract: Adrenal glands are within normal limits.

Subcentimeter right renal cyst (series 2/image 65). Left kidney is
within normal limits. No hydronephrosis.

Bladder is mildly thick-walled although underdistended.

Stomach/Bowel: Stomach is within normal limits.

No evidence of bowel obstruction.

Normal appendix (series 2/image 103).

Vascular/Lymphatic: No evidence of abdominal aortic aneurysm.

No suspicious abdominopelvic lymphadenopathy.

Reproductive: Prostate is unremarkable.

Other: No abdominopelvic ascites.

Musculoskeletal: Multifocal sclerotic osseous metastases throughout
the visualized axial and appendicular skeleton. This is certainly
more conspicuous than on prior CT, although the overall appearance
is unchanged from MR, and may simply reflect treatment related
sclerosis.

Mild superior endplate compression fracture deformity at L2
(sagittal image 96), unchanged, without retropulsion.
IMPRESSION: Radiation changes in the medial right upper lobe. Underlying viable
tumor is difficult to exclude. Continued attention on follow-up is
suggested.

Mild thoracic lymphadenopathy, improved.

Multifocal hepatic metastases, mildly improved.

Multifocal osseous metastases throughout the visualized axial and
appendicular skeleton, more conspicuous on CT but unchanged from
prior MR, favoring treatment related sclerosis. Associated
pathologic fracture at T3 and L2, unchanged.

## 2019-09-12 IMAGING — CT CT NECK W/ CM
3 of 4 series · 14 of 33 positions shown, 17 images · IV contrast (OMNIPAQUE)
Comparison: Concurrently performed chest CT [DATE], cervical
spine MRI [DATE], brain MRI [DATE]

CLINICAL DATA: Primary malignant neoplasm of right lung metastatic
to other site. Lymphadenopathy, neck. Additional history provided:
Ongoing chemotherapy, immunotherapy, radiation therapy completed [DATE], left lateral neck swelling for several weeks.

EXAM:
CT NECK WITH CONTRAST
TECHNIQUE: Multidetector CT imaging of the neck was performed using the
standard protocol following the bolus administration of intravenous
contrast.
CONTRAST:  100mL OMNIPAQUE IOHEXOL 300 MG/ML  SOLN

[Series 602: axial reformats · axial · 0.46mm/px · z∈[-283,-110]mm · 6 of 135 slices shown, 8 images]
[im 20/135  soft-tissue]
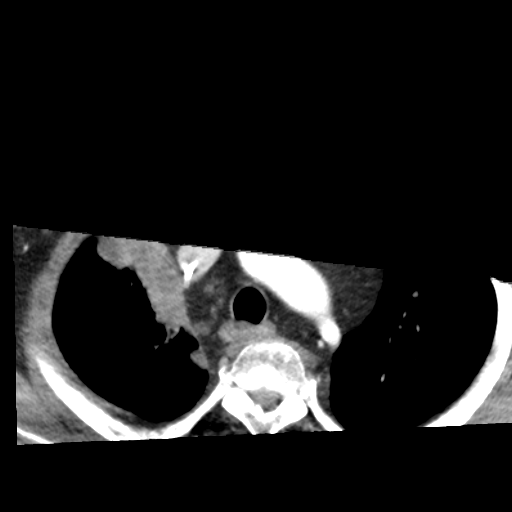
[im 20/135  bone]
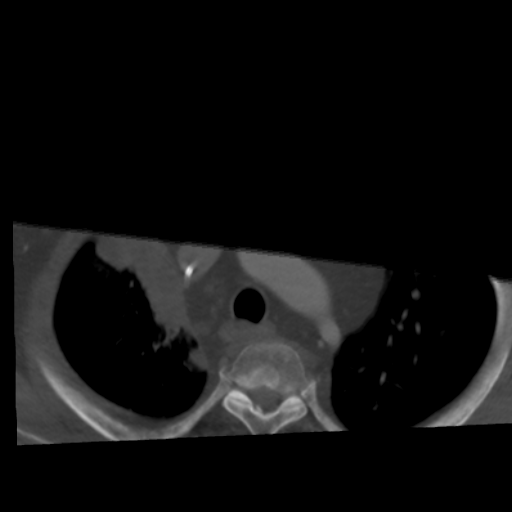
[im 39/135  bone]
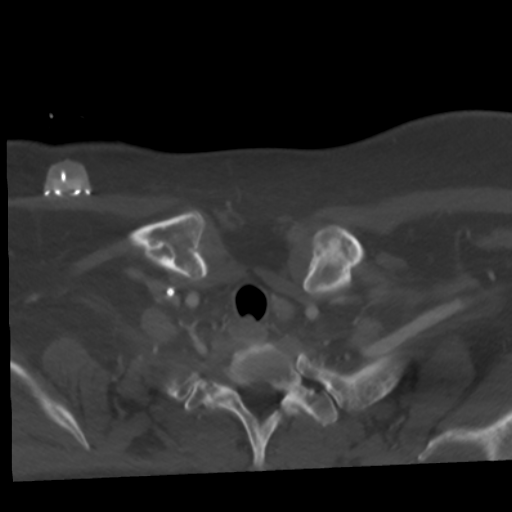
[im 58/135  bone]
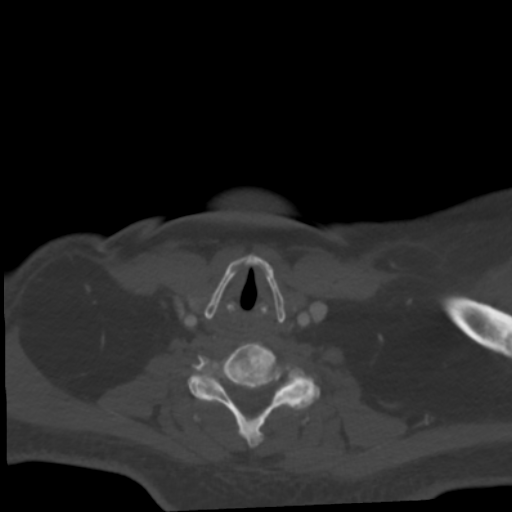
[im 77/135  bone]
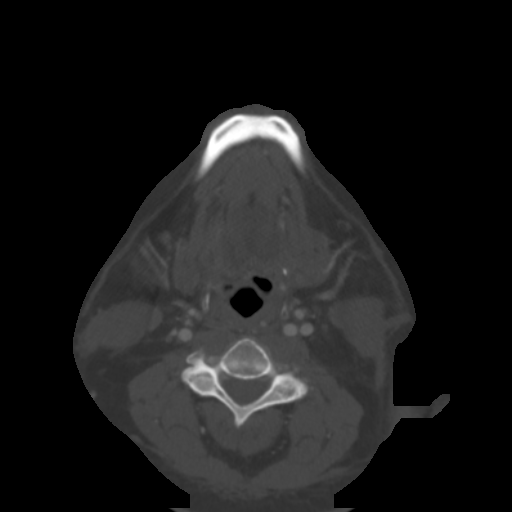
[im 96/135  soft-tissue]
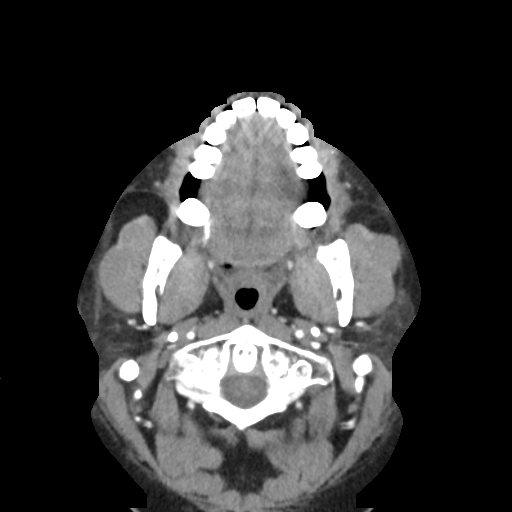
[im 96/135  bone]
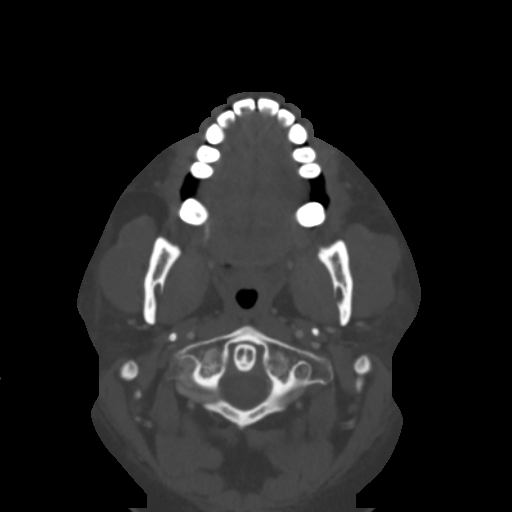
[im 115/135  bone]
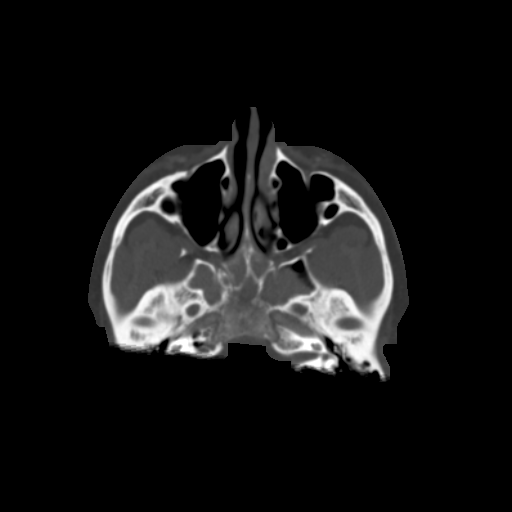

[Series 603: coronal images · coronal · 0.46mm/px · 3 of 118 slices shown]
[im 35/118  bone]
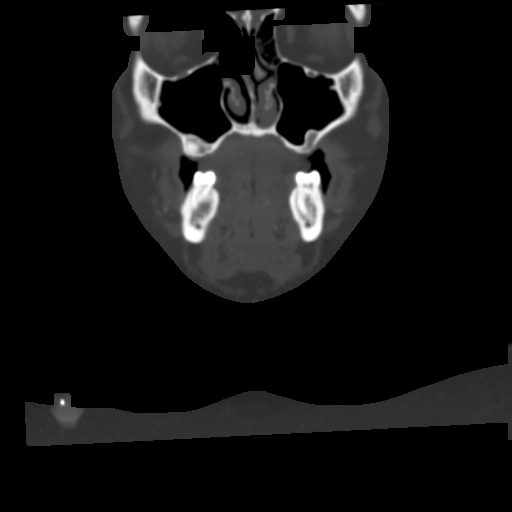
[im 51/118  bone]
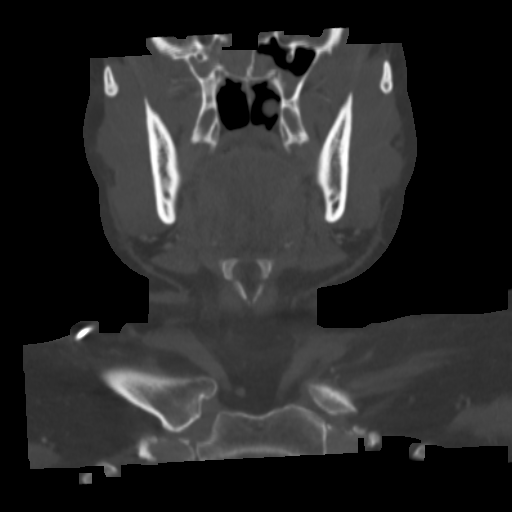
[im 67/118  bone]
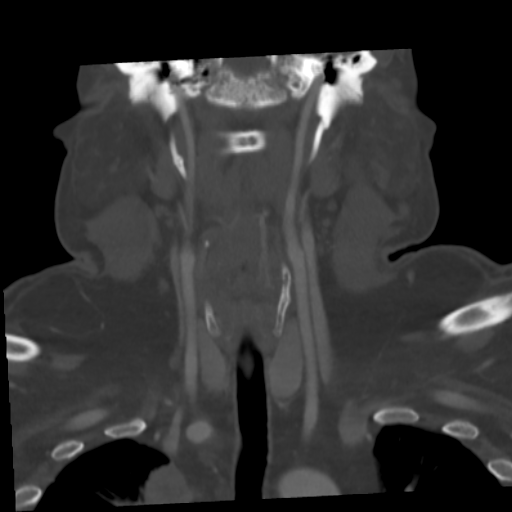

[Series 604: sagittal images · sagittal · 0.46mm/px · 5 of 95 slices shown, 6 images]
[im 32/95  bone]
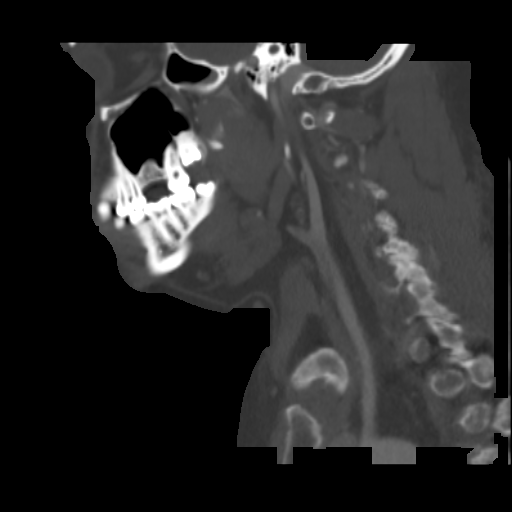
[im 40/95  bone]
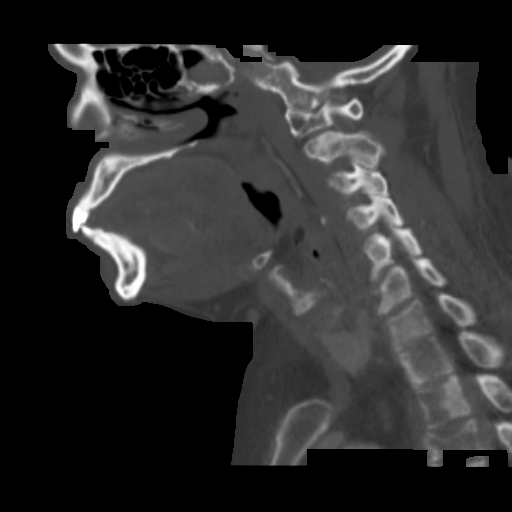
[im 48/95  soft-tissue]
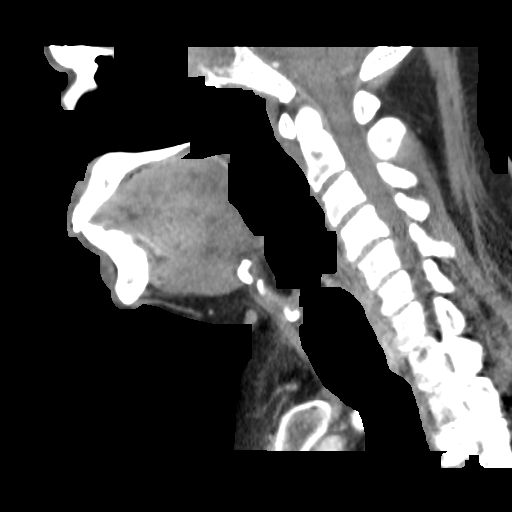
[im 48/95  bone]
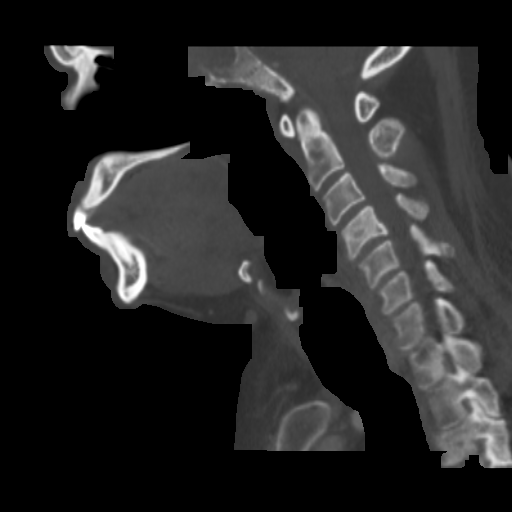
[im 55/95  bone]
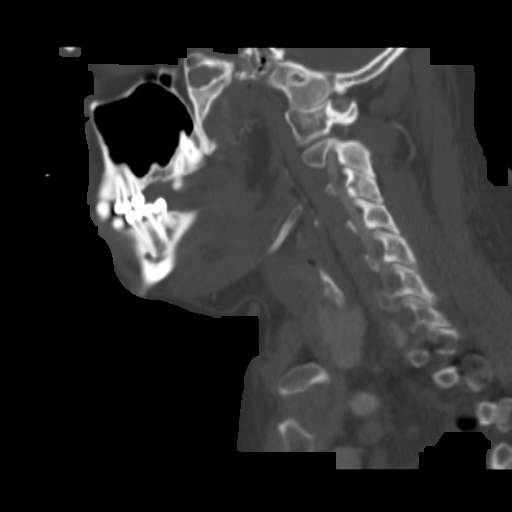
[im 63/95  bone]
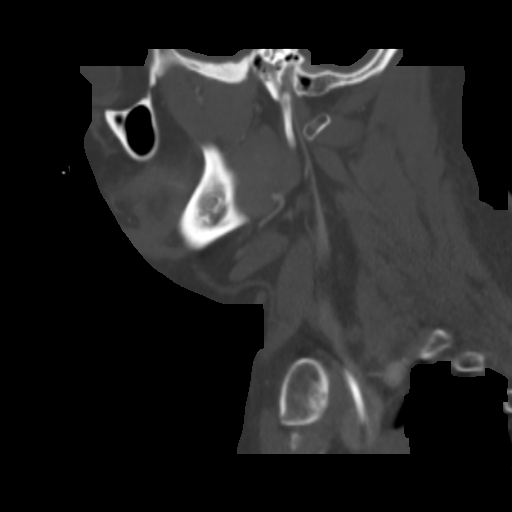

[14 of 33 positions shown; findings below may reference images not displayed]

FINDINGS: Pharynx and larynx: Poor dentition with multiple carious and absent
teeth no appreciable discrete mass or swelling within the visualized
oral cavity, pharynx or larynx.

Salivary glands: Fatty atrophy of the parotid glands. The
submandibular glands are unremarkable.

Thyroid: Thyroid unremarkable.

Lymph nodes: No pathologically enlarged cervical chain lymph nodes
are identified.

Vascular: The internal jugular veins are diffusely narrowed in
appearance, but patent bilaterally.

Limited intracranial: The patient has numerous known intracranial
metastatic lesions demonstrated on the prior brain MRI of
[DATE].

Visualized orbits: Incompletely imaged. Visualized orbits show no
acute finding.

Mastoids and visualized paranasal sinuses: Complete opacification of
the right sphenoid sinus. Extensive partial opacification of the
left sphenoid sinus. No significant mastoid effusion

Skeleton: Redemonstrated widespread osseous metastatic disease
within the skull base, cervical spine and visualized upper thoracic
spine.

Upper chest: Reported separately. Partially visualized right IJ
approach infusion port catheter.
IMPRESSION: No pathologically enlarged cervical chain lymph nodes.

Redemonstrated widespread osseous metastatic disease within the
skull base, cervical spine and visualized upper thoracic spine.

Sphenoid sinusitis.

Please refer to the concurrently performed and separately reported
chest CT for a description of intrathoracic findings.

## 2019-09-12 MED ORDER — IOHEXOL 300 MG/ML  SOLN
100.0000 mL | Freq: Once | INTRAMUSCULAR | Status: AC | PRN
  Administered 2019-09-12: 100 mL via INTRAVENOUS

## 2019-09-12 MED ORDER — HEPARIN SOD (PORK) LOCK FLUSH 100 UNIT/ML IV SOLN: 500.0000 [IU] | Freq: Once | INTRAVENOUS | Status: AC

## 2019-09-12 MED ORDER — HEPARIN SOD (PORK) LOCK FLUSH 100 UNIT/ML IV SOLN
INTRAVENOUS | Status: AC
  Administered 2019-09-12: 500 [IU] via INTRAVENOUS
  Filled 2019-09-12: qty 5

## 2019-09-12 MED ORDER — SODIUM CHLORIDE (PF) 0.9 % IJ SOLN
INTRAMUSCULAR | Status: AC
  Filled 2019-09-12: qty 50

## 2019-09-13 ENCOUNTER — Other Ambulatory Visit: Payer: Self-pay | Admitting: Hematology and Oncology

## 2019-09-14 ENCOUNTER — Other Ambulatory Visit: Payer: Self-pay

## 2019-09-14 ENCOUNTER — Inpatient Hospital Stay: Payer: PRIVATE HEALTH INSURANCE

## 2019-09-14 ENCOUNTER — Other Ambulatory Visit: Payer: Self-pay | Admitting: Hematology

## 2019-09-14 ENCOUNTER — Inpatient Hospital Stay (HOSPITAL_BASED_OUTPATIENT_CLINIC_OR_DEPARTMENT_OTHER): Payer: PRIVATE HEALTH INSURANCE | Admitting: Internal Medicine

## 2019-09-14 VITALS — BP 137/94 | HR 105 | Temp 98.1°F | Resp 18 | Ht 68.0 in | Wt 210.0 lb

## 2019-09-14 DIAGNOSIS — R739 Hyperglycemia, unspecified: Secondary | ICD-10-CM | POA: Diagnosis not present

## 2019-09-14 DIAGNOSIS — C3491 Malignant neoplasm of unspecified part of right bronchus or lung: Secondary | ICD-10-CM

## 2019-09-14 DIAGNOSIS — Z5111 Encounter for antineoplastic chemotherapy: Secondary | ICD-10-CM | POA: Diagnosis not present

## 2019-09-14 DIAGNOSIS — C7931 Secondary malignant neoplasm of brain: Secondary | ICD-10-CM

## 2019-09-14 DIAGNOSIS — C7949 Secondary malignant neoplasm of other parts of nervous system: Secondary | ICD-10-CM

## 2019-09-14 DIAGNOSIS — R1011 Right upper quadrant pain: Secondary | ICD-10-CM

## 2019-09-14 LAB — CMP (CANCER CENTER ONLY)
ALT: 165 U/L — ABNORMAL HIGH (ref 0–44)
AST: 47 U/L — ABNORMAL HIGH (ref 15–41)
Albumin: 3.4 g/dL — ABNORMAL LOW (ref 3.5–5.0)
Alkaline Phosphatase: 530 U/L — ABNORMAL HIGH (ref 38–126)
Anion gap: 12 (ref 5–15)
BUN: 25 mg/dL — ABNORMAL HIGH (ref 6–20)
CO2: 22 mmol/L (ref 22–32)
Calcium: 9.6 mg/dL (ref 8.9–10.3)
Chloride: 104 mmol/L (ref 98–111)
Creatinine: 1.39 mg/dL — ABNORMAL HIGH (ref 0.61–1.24)
GFR, Est AFR Am: >60 mL/min (ref >60)
GFR, Estimated: >60 mL/min (ref >60)
Glucose, Bld: 388 mg/dL — ABNORMAL HIGH (ref 70–99)
Potassium: 4.3 mmol/L (ref 3.5–5.1)
Sodium: 138 mmol/L (ref 135–145)
Total Bilirubin: 0.4 mg/dL (ref 0.3–1.2)
Total Protein: 7 g/dL (ref 6.5–8.1)

## 2019-09-14 LAB — CBC WITH DIFFERENTIAL (CANCER CENTER ONLY)
Abs Immature Granulocytes: 0.2 10*3/uL — ABNORMAL HIGH (ref 0.00–0.07)
Basophils Absolute: 0 10*3/uL (ref 0.0–0.1)
Basophils Relative: 0 %
Eosinophils Absolute: 0 10*3/uL (ref 0.0–0.5)
Eosinophils Relative: 0 %
HCT: 32.2 % — ABNORMAL LOW (ref 39.0–52.0)
Hemoglobin: 9.8 g/dL — ABNORMAL LOW (ref 13.0–17.0)
Immature Granulocytes: 4 %
Lymphocytes Relative: 6 %
Lymphs Abs: 0.3 10*3/uL — ABNORMAL LOW (ref 0.7–4.0)
MCH: 29.5 pg (ref 26.0–34.0)
MCHC: 30.4 g/dL (ref 30.0–36.0)
MCV: 97 fL (ref 80.0–100.0)
Monocytes Absolute: 0.8 10*3/uL (ref 0.1–1.0)
Monocytes Relative: 17 %
Neutro Abs: 3.3 10*3/uL (ref 1.7–7.7)
Neutrophils Relative %: 73 %
Platelet Count: 120 10*3/uL — ABNORMAL LOW (ref 150–400)
RBC: 3.32 MIL/uL — ABNORMAL LOW (ref 4.22–5.81)
RDW: 19.7 % — ABNORMAL HIGH (ref 11.5–15.5)
WBC Count: 4.6 10*3/uL (ref 4.0–10.5)
nRBC: 5.9 % — ABNORMAL HIGH (ref 0.0–0.2)

## 2019-09-14 LAB — GLUCOSE, CAPILLARY
Glucose-Capillary: 354 mg/dL — ABNORMAL HIGH (ref 70–99)
Glucose-Capillary: 443 mg/dL — ABNORMAL HIGH (ref 70–99)

## 2019-09-14 LAB — TSH: TSH: 1.777 u[IU]/mL (ref 0.320–4.118)

## 2019-09-14 MED ORDER — SODIUM CHLORIDE 0.9 % IV SOLN
617.5000 mg | Freq: Once | INTRAVENOUS | Status: AC
  Administered 2019-09-14: 620 mg via INTRAVENOUS
  Filled 2019-09-14: qty 62

## 2019-09-14 MED ORDER — SODIUM CHLORIDE 0.9 % IV SOLN
150.0000 mg | Freq: Once | INTRAVENOUS | Status: AC
  Administered 2019-09-14: 150 mg via INTRAVENOUS
  Filled 2019-09-14: qty 150

## 2019-09-14 MED ORDER — SODIUM CHLORIDE 0.9 % IV SOLN
200.0000 mg | Freq: Once | INTRAVENOUS | Status: AC
  Administered 2019-09-14: 200 mg via INTRAVENOUS
  Filled 2019-09-14: qty 8

## 2019-09-14 MED ORDER — INSULIN REGULAR HUMAN 100 UNIT/ML IJ SOLN
6.0000 [IU] | Freq: Once | INTRAMUSCULAR | Status: AC
  Administered 2019-09-14: 6 [IU] via SUBCUTANEOUS

## 2019-09-14 MED ORDER — INSULIN REGULAR HUMAN 100 UNIT/ML IJ SOLN
INTRAMUSCULAR | Status: AC
  Filled 2019-09-14: qty 1

## 2019-09-14 MED ORDER — INSULIN REGULAR HUMAN 100 UNIT/ML IJ SOLN
4.0000 [IU] | Freq: Once | INTRAMUSCULAR | Status: AC
  Administered 2019-09-14: 4 [IU] via SUBCUTANEOUS

## 2019-09-14 MED ORDER — HYDROMORPHONE HCL 1 MG/ML IJ SOLN
2.0000 mg | Freq: Once | INTRAMUSCULAR | Status: AC
  Administered 2019-09-14: 2 mg via INTRAVENOUS

## 2019-09-14 MED ORDER — SODIUM CHLORIDE 0.9 % IV SOLN
500.0000 mg/m2 | Freq: Once | INTRAVENOUS | Status: AC
  Administered 2019-09-14: 1100 mg via INTRAVENOUS
  Filled 2019-09-14: qty 40

## 2019-09-14 MED ORDER — HEPARIN SOD (PORK) LOCK FLUSH 100 UNIT/ML IV SOLN
500.0000 [IU] | Freq: Once | INTRAVENOUS | Status: AC | PRN
  Administered 2019-09-14: 500 [IU]
  Filled 2019-09-14: qty 5

## 2019-09-14 MED ORDER — SODIUM CHLORIDE 0.9 % IV SOLN
Freq: Once | INTRAVENOUS | Status: AC
  Filled 2019-09-14: qty 250

## 2019-09-14 MED ORDER — SODIUM CHLORIDE 0.9% FLUSH
10.0000 mL | INTRAVENOUS | Status: DC | PRN
  Administered 2019-09-14: 10 mL
  Filled 2019-09-14: qty 10

## 2019-09-14 MED ORDER — PALONOSETRON HCL INJECTION 0.25 MG/5ML
0.2500 mg | Freq: Once | INTRAVENOUS | Status: AC
  Administered 2019-09-14: 0.25 mg via INTRAVENOUS

## 2019-09-14 MED ORDER — HYDROMORPHONE HCL 1 MG/ML IJ SOLN
INTRAMUSCULAR | Status: AC
  Filled 2019-09-14: qty 2

## 2019-09-14 MED ORDER — SODIUM CHLORIDE 0.9 % IV SOLN
10.0000 mg | Freq: Once | INTRAVENOUS | Status: AC
  Administered 2019-09-14: 10 mg via INTRAVENOUS
  Filled 2019-09-14: qty 10

## 2019-09-14 MED ORDER — PALONOSETRON HCL INJECTION 0.25 MG/5ML
INTRAVENOUS | Status: AC
  Filled 2019-09-14: qty 5

## 2019-09-14 MED ORDER — DEXAMETHASONE 1 MG PO TABS: 1.0000 mg | ORAL_TABLET | Freq: Every day | ORAL | 0 refills | Status: DC

## 2019-09-14 NOTE — Patient Instructions (Signed)
[facility] Discharge Instructions for Patients Receiving Chemotherapy  Today you received the following chemotherapy agents: Keytruda, Alimta, and Carboplatin   To help prevent nausea and vomiting after your treatment, we encourage you to take your nausea medication as prescribed.    If you develop nausea and vomiting that is not controlled by your nausea medication, call the clinic.   BELOW ARE SYMPTOMS THAT SHOULD BE REPORTED IMMEDIATELY:  *FEVER GREATER THAN 100.5 F  *CHILLS WITH OR WITHOUT FEVER  NAUSEA AND VOMITING THAT IS NOT CONTROLLED WITH YOUR NAUSEA MEDICATION  *UNUSUAL SHORTNESS OF BREATH  *UNUSUAL BRUISING OR BLEEDING  TENDERNESS IN MOUTH AND THROAT WITH OR WITHOUT PRESENCE OF ULCERS  *URINARY PROBLEMS  *BOWEL PROBLEMS  UNUSUAL RASH Items with * indicate a potential emergency and should be followed up as soon as possible.  Feel free to call the clinic should you have any questions or concerns. The clinic phone number is [phone].  Please show the [facility] at check-in to the Emergency Department and triage nurse.

## 2019-09-14 NOTE — Patient Instructions (Signed)

## 2019-09-14 NOTE — Progress Notes (Signed)
William Ali at [facility] [place] [place], [place] [address] [phone]   Interval Evaluation  Date of Service: 09/14/19 Patient Name: William Ali Patient MRN: [id number] Patient DOB: [birth year] Provider: [name], MD  Identifying Statement:  William Ali is a 34 y.o. male with brain metastases  CNS Oncologic History:  Oncology History  Metastatic adenocarcinoma to brain [facility])  05/21/2019 Initial Diagnosis   Metastatic adenocarcinoma to brain ([place])   06/23/2019 -  Chemotherapy   The patient had palonosetron (ALOXI) injection 0.25 mg, 0.25 mg, Intravenous,  Once, 5 of 6 cycles Administration: 0.25 mg (06/23/2019), 0.25 mg (08/04/2019), 0.25 mg (08/24/2019), 0.25 mg (07/13/2019), 0.25 mg (09/14/2019) PEMEtrexed (ALIMTA) 1,100 mg in sodium chloride 0.9 % 100 mL chemo infusion, 500 mg/m2 = 1,100 mg, Intravenous,  Once, 5 of 6 cycles Administration: 1,100 mg (06/23/2019), 1,100 mg (08/04/2019), 1,100 mg (08/24/2019), 1,100 mg (07/13/2019), 1,100 mg (09/14/2019) CARBOplatin (PARAPLATIN) 730 mg in sodium chloride 0.9 % 250 mL chemo infusion, 730 mg (115.3 % of original dose 629.5 mg), Intravenous,  Once, 5 of 6 cycles Dose modification:   (original dose 629.5 mg, Cycle 1) Administration: 730 mg (06/23/2019), 710 mg (08/04/2019), 710 mg (08/24/2019), 710 mg (07/13/2019), 620 mg (09/14/2019) fosaprepitant (EMEND) 150 mg in sodium chloride 0.9 % 145 mL IVPB, 150 mg, Intravenous,  Once, 5 of 6 cycles Administration: 150 mg (06/23/2019), 150 mg (08/04/2019), 150 mg (08/24/2019), 150 mg (07/13/2019), 150 mg (09/14/2019) pembrolizumab (KEYTRUDA) 200 mg in sodium chloride 0.9 % 50 mL chemo infusion, 200 mg, Intravenous, Once, 4 of 5 cycles Administration: 200 mg (08/04/2019), 200 mg (08/24/2019), 200 mg (07/13/2019), 200 mg (09/14/2019)  for chemotherapy treatment.    Adenocarcinoma of lung ([place])  05/21/2019 Initial Diagnosis   Adenocarcinoma of lung ([place])   06/23/2019 -   Chemotherapy   The patient had palonosetron (ALOXI) injection 0.25 mg, 0.25 mg, Intravenous,  Once, 5 of 6 cycles Administration: 0.25 mg (06/23/2019), 0.25 mg (08/04/2019), 0.25 mg (08/24/2019), 0.25 mg (07/13/2019), 0.25 mg (09/14/2019) PEMEtrexed (ALIMTA) 1,100 mg in sodium chloride 0.9 % 100 mL chemo infusion, 500 mg/m2 = 1,100 mg, Intravenous,  Once, 5 of 6 cycles Administration: 1,100 mg (06/23/2019), 1,100 mg (08/04/2019), 1,100 mg (08/24/2019), 1,100 mg (07/13/2019), 1,100 mg (09/14/2019) CARBOplatin (PARAPLATIN) 730 mg in sodium chloride 0.9 % 250 mL chemo infusion, 730 mg (115.3 % of original dose 629.5 mg), Intravenous,  Once, 5 of 6 cycles Dose modification:   (original dose 629.5 mg, Cycle 1) Administration: 730 mg (06/23/2019), 710 mg (08/04/2019), 710 mg (08/24/2019), 710 mg (07/13/2019), 620 mg (09/14/2019) fosaprepitant (EMEND) 150 mg in sodium chloride 0.9 % 145 mL IVPB, 150 mg, Intravenous,  Once, 5 of 6 cycles Administration: 150 mg (06/23/2019), 150 mg (08/04/2019), 150 mg (08/24/2019), 150 mg (07/13/2019), 150 mg (09/14/2019) pembrolizumab (KEYTRUDA) 200 mg in sodium chloride 0.9 % 50 mL chemo infusion, 200 mg, Intravenous, Once, 4 of 5 cycles Administration: 200 mg (08/04/2019), 200 mg (08/24/2019), 200 mg (07/13/2019), 200 mg (09/14/2019)  for chemotherapy treatment.     CNS Oncologic History 06/21/19: Completes WBRT, additional SRS to lumbar and sacral spine William Ali)  Interval History:  William Ali presents to clinic today following whole brain radiation and recent MRI brain.  He continues to experience very heavy level of fatigue and malaise from his cancer treatments.  He has significant dependent edema including facial.  He mostly lays in bed and is not very active or interactive when awake.  Currently undergoing treatment with carboplatin, alimta, keytruda.  No new or progressive neurologic deficits, but he does experience weakness and difficulty climbing stairs and rising from seated position.   Manages cancer related pain with dilaudid and PRN fentanyl patch.  H+P (05/23/19) Patient presents to clinic today after lung cancer staging workup uncovered multiple brain metastases.  He complains of pain and tingling affecting his left leg, with symptoms moving down back of leg from lower back.  This may improve with change in position.  He also describes significant visual alteration affecting his left eye.  This is currently being evaluated by [facility] ophthalmologist, etiology is unclear.  He also describes headaches "in temples" which occur daily when exposed to a lot of light.  Symptoms resolve with covering of left eye or darkening of room.  Currently taking 1m daily decadron. Otherwise, he is walking and talking normally, not currently working, taking care of his two children at home (10 and 7).     Medications: Current Outpatient Medications on File Prior to Visit  Medication Sig Dispense Refill  . albuterol (VENTOLIN HFA) 108 (90 Base) MCG/ACT inhaler TAKE 2 PUFFS BY MOUTH EVERY 6 HOURS AS NEEDED FOR WHEEZE OR SHORTNESS OF BREATH 6.7 g 1  . alum & mag hydroxide-simeth (MAALOX MULTI SYMPTOM MAX ST) 400-400-40 MG/5ML suspension Take 10 mLs by mouth every 6 (six) hours as needed for indigestion. 355 mL 2  . amphetamine-dextroamphetamine (ADDERALL) 10 MG tablet Take 10 mg by mouth every morning, may take 158madditional dose daily as needed for fatigue (Patient not taking: Reported on 08/23/2019) 60 tablet 0  . dexamethasone (DECADRON) 4 MG tablet Take 1 tablet (4 mg total) by mouth 2 (two) times daily with a meal. Starting on 5/11, and drop dose to once daily on 5/14 60 tablet 0  . DULoxetine (CYMBALTA) 30 MG capsule Take 1 capsule (30 mg total) by mouth daily. 30 capsule 3  . fentaNYL (DURAGESIC) 100 MCG/HR Place 1 patch onto the skin every 3 (three) days. 10 patch 0  . folic acid (FOLVITE) 1 MG tablet Take 1 tablet (1 mg total) by mouth daily. 60 tablet 3  . HYDROmorphone (DILAUDID) 8 MG tablet  Take 1/2- 1 tablet every 4 hours as needed for pain 90 tablet 0  . lidocaine-prilocaine (EMLA) cream Apply 1 application topically as needed. 30 g 2  . LORazepam (ATIVAN) 1 MG tablet Take 1 tablet (1 mg total) by mouth as directed. Please take 30 to 60 minutes prior to CT scans. 1 tablet 0  . metoprolol succinate (TOPROL XL) 50 MG 24 hr tablet Take 1 tablet (50 mg total) by mouth daily. Take with or immediately following a meal. 30 tablet 11  . ondansetron (ZOFRAN) 4 MG tablet Take 1 tablet (4 mg total) by mouth every 8 (eight) hours as needed for nausea or vomiting. 60 tablet 0  . pantoprazole (PROTONIX) 40 MG tablet Take 1 tablet (40 mg total) by mouth 2 (two) times daily. 120 tablet 3  . polyethylene glycol (MIRALAX / GLYCOLAX) 17 g packet Take 17 g by mouth daily. 14 each 0  . rivaroxaban (XARELTO) 20 MG TABS tablet Take 1 tablet (20 mg total) by mouth daily with supper. 90 tablet 1  . senna-docusate (SENOKOT-S) 8.6-50 MG tablet Take 2 tablets by mouth 2 (two) times daily. (Patient taking differently: Take 3 tablets by mouth at bedtime. ) 120 tablet 0  . [DISCONTINUED] amLODipine (NORVASC) 5 MG tablet Take 1 tablet (5 mg total)  by mouth daily. 30 tablet 5  . [DISCONTINUED] famotidine (PEPCID) 20 MG tablet Take 1 tablet (20 mg total) by mouth 2 (two) times daily. (Patient taking differently: Take 20 mg by mouth 2 (two) times daily as needed for heartburn. ) 30 tablet 6   Current Facility-Administered Medications on File Prior to Visit  Medication Dose Route Frequency Provider Last Rate Last Admin  . sodium chloride flush (NS) 0.9 % injection 10 mL  10 mL Intracatheter PRN Orson Slick, MD   10 mL at 09/14/19 1442    Allergies:  Allergies  Allergen Reactions  . Onion Anaphylaxis  . Other     All sea food causes swelling and a rash.    . Peanut Oil Itching  . Peanut-Containing Drug Products Itching   Past Medical History:  Past Medical History:  Diagnosis Date  . Back pain   .  Hypertension   . 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 IR IMAGING GUIDED PORT INSERTION  06/12/2019  . KNEE SURGERY     Social History:  Social History   Socioeconomic History  . Marital status: Married    Spouse name: Not on file  . Number of children: Not on file  . Years of education: Not on file  . Highest education level: Not on file  Occupational History  . Not on file  Tobacco Use  . Smoking status: Never Smoker  . Smokeless tobacco: Never Used  Vaping Use  . Vaping Use: Never used  Substance and Sexual Activity  . Alcohol use: No  . Drug use: No  . Sexual activity: Yes  Other Topics Concern  . Not on file  Social History Narrative  . Not on file   Social Determinants of Health   Financial Resource Strain:   . Difficulty of Paying Living Expenses: Not on file  Food Insecurity:   . Worried About Charity fundraiser in the Last Year: Not on file  . Ran Out of Food in the Last Year: Not on file  Transportation Needs:   . Lack of Transportation (Medical): Not on file  . Lack of Transportation (Non-Medical): Not on file  Physical Activity:   . Days of Exercise per Week: Not on file  . Minutes of Exercise per Session: Not on file  Stress:   . Feeling of Stress : Not on file  Social Connections:   . Frequency of Communication with Friends and Family: Not on file  . Frequency of Social Gatherings with Friends and Family: Not on file  . Attends Religious Services: Not on file  . Active Member of Clubs or Organizations: Not on file  . Attends Archivist Meetings: Not on file  . Marital Status: Not on file  Intimate Partner Violence:   . Fear of Current or Ex-Partner: Not on file  . Emotionally Abused: Not on file  . Physically Abused: Not on file  . Sexually Abused: Not on file   Family History:  Family History  Problem Relation Age of Onset  .  Hypertension Mother   . Diabetes Father     Review of Systems: Constitutional: Doesn't report fevers, chills or abnormal weight loss Eyes: Doesn't report blurriness of vision Ears, nose, mouth, throat, and face: Doesn't report sore throat Respiratory: Doesn't report cough, dyspnea or wheezes Cardiovascular: Doesn't report palpitation, chest discomfort  Gastrointestinal:  Doesn't  report nausea, constipation, diarrhea GU: Doesn't report incontinence Skin: Doesn't report skin rashes Neurological: Per HPI Musculoskeletal: Doesn't report joint pain Behavioral/Psych: Doesn't report anxiety  Physical Exam: Vitals:   09/14/19 1448  BP: (!) 137/94  Pulse: (!) 105  Resp: 18  Temp: 98.1 F (36.7 C)  SpO2: 100%   KPS: 70. General: Cushingoid appearance Head: Normal EENT: No conjunctival injection or scleral icterus.  Lungs: Resp effort normal Cardiac: Regular rate Abdomen: Non-distended abdomen Skin: No rashes cyanosis or petechiae. Extremities: No clubbing or edema  Neurologic Exam: Mental Status: Awake, alert, attentive to examiner. Oriented to self and environment. Language is fluent with intact comprehension.  Cranial Nerves: Visual acuity is impaired in left eye, but visual fields are full. Extra-ocular movements intact. No ptosis. Face is symmetric Motor: Tone and bulk are normal. Hip girdle weakness. Reflexes are symmetric, no pathologic reflexes present.  Sensory: Decreased upper left leg Gait: Deferred   Labs: I have reviewed the data as listed    Component Value Date/Time   NA 138 09/14/2019 0915   K 4.3 09/14/2019 0915   CL 104 09/14/2019 0915   CO2 22 09/14/2019 0915   GLUCOSE 388 (H) 09/14/2019 0915   BUN 25 (H) 09/14/2019 0915   CREATININE 1.39 (H) 09/14/2019 0915   CALCIUM 9.6 09/14/2019 0915   PROT 7.0 09/14/2019 0915   ALBUMIN 3.4 (L) 09/14/2019 0915   AST 47 (H) 09/14/2019 0915   ALT 165 (H) 09/14/2019 0915   ALKPHOS 530 (H) 09/14/2019 0915    BILITOT 0.4 09/14/2019 0915   GFRNONAA >60 09/14/2019 0915   GFRAA >60 09/14/2019 0915   Lab Results  Component Value Date   WBC 4.6 09/14/2019   NEUTROABS 3.3 09/14/2019   HGB 9.8 (L) 09/14/2019   HCT 32.2 (L) 09/14/2019   MCV 97.0 09/14/2019   PLT 120 (L) 09/14/2019   Imaging:  [place] Clinician Interpretation: I have personally reviewed the CNS images as listed.  My interpretation, in the context of the patient's clinical presentation, is stable disease  CT Soft Tissue Neck W Contrast  Result Date: 09/12/2019 CLINICAL DATA:  Primary malignant neoplasm of right lung metastatic to other site. Lymphadenopathy, neck. Additional history provided: Ongoing chemotherapy, immunotherapy, radiation therapy completed May 2021, left lateral neck swelling for several weeks. EXAM: CT NECK WITH CONTRAST TECHNIQUE: Multidetector CT imaging of the neck was performed using the standard protocol following the bolus administration of intravenous contrast. CONTRAST:  147m OMNIPAQUE IOHEXOL 300 MG/ML  SOLN COMPARISON:  Concurrently performed chest CT 09/12/2019, cervical spine MRI 09/06/2018, brain MRI 09/06/2019 FINDINGS: Pharynx and larynx: Poor dentition with multiple carious and absent teeth no appreciable discrete mass or swelling within the visualized oral cavity, pharynx or larynx. Salivary glands: Fatty atrophy of the parotid glands. The submandibular glands are unremarkable. Thyroid: Thyroid unremarkable. Lymph nodes: No pathologically enlarged cervical chain lymph nodes are identified. Vascular: The internal jugular veins are diffusely narrowed in appearance, but patent bilaterally. Limited intracranial: The patient has numerous known intracranial metastatic lesions demonstrated on the prior brain MRI of 09/06/2019. Visualized orbits: Incompletely imaged. Visualized orbits show no acute finding. Mastoids and visualized paranasal sinuses: Complete opacification of the right sphenoid sinus. Extensive partial  opacification of the left sphenoid sinus. No significant mastoid effusion Skeleton: Redemonstrated widespread osseous metastatic disease within the skull base, cervical spine and visualized upper thoracic spine. Upper chest: Reported separately. Partially visualized right IJ approach infusion port catheter. IMPRESSION: No pathologically enlarged cervical chain lymph nodes. Redemonstrated widespread  osseous metastatic disease within the skull base, cervical spine and visualized upper thoracic spine. Sphenoid sinusitis. Please refer to the concurrently performed and separately reported chest CT for a description of intrathoracic findings. Electronically Signed   By: Kellie Simmering DO   On: 09/12/2019 21:24   CT Chest W Contrast  Result Date: 09/13/2019 CLINICAL DATA:  Metastatic lung cancer, chemotherapy/immunotherapy ongoing, XRT complete 05/2019. Mid chest pain, hemoptysis, weight loss. Right upper quadrant abdominal pain, constipation. EXAM: CT CHEST, ABDOMEN, AND PELVIS WITH CONTRAST TECHNIQUE: Multidetector CT imaging of the chest, abdomen and pelvis was performed following the standard protocol during bolus administration of intravenous contrast. CONTRAST:  167m OMNIPAQUE IOHEXOL 300 MG/ML  SOLN COMPARISON:  CTA chest and CT abdomen/pelvis dated 06/17/2019. MRI thoracolumbar spine dated 06/17/2019. FINDINGS: CT CHEST FINDINGS Cardiovascular: Heart is normal in size.  No pericardial effusion. No evidence of thoracic aortic aneurysm. Right chest port terminates the cavoatrial junction. Mediastinum/Nodes: Improving thoracic lymphadenopathy. 10 mm short axis right paratracheal node (series 2/image 22), previously 16 mm. 15 mm short axis right hilar node (series 2/image 24), previously 25 mm. No suspicious axillary lymphadenopathy. Visualized thyroid is unremarkable. Lungs/Pleura: Suspected radiation changes in the medial right upper lobe (series 4/image 44). Given the residual masslike appearance (series 2/image  18), underlying viable tumor cannot be entirely excluded on this baseline post treatment evaluation. Lungs are otherwise clear. No pleural effusion or pneumothorax. Musculoskeletal: Multifocal sclerotic osseous metastases throughout the visualized axial and appendicular skeleton. This is certainly more conspicuous than on prior CT, although the overall appearance is unchanged from MR, and may simply reflect treatment related sclerosis. Moderate compression fracture deformity at T3 (sagittal image 100), minimally progressive. No retropulsion. CT ABDOMEN PELVIS FINDINGS Hepatobiliary: Numerous hepatic metastases in both lobes, approximately 30 in number. Index lesions include: --2.3 cm lesion in the medial aspect of the posterior right hepatic lobe (series 2/image 49), previously 2.9 cm --2.2 cm lesion in the central left hepatic lobe (series 2/image 48), previously 2.2 cm --2.6 cm lesion inferiorly in the posterior right hepatic lobe (series 2/image 67), previously 3.1 cm Overall, mild improvement is suspected. Gallbladder is unremarkable. No intrahepatic or extrahepatic ductal dilatation. Pancreas: Within normal limits. Spleen: Normal limits. Adrenals/Urinary Tract: Adrenal glands are within normal limits. Subcentimeter right renal cyst (series 2/image 65). Left kidney is within normal limits. No hydronephrosis. Bladder is mildly thick-walled although underdistended. Stomach/Bowel: Stomach is within normal limits. No evidence of bowel obstruction. Normal appendix (series 2/image 103). Vascular/Lymphatic: No evidence of abdominal aortic aneurysm. No suspicious abdominopelvic lymphadenopathy. Reproductive: Prostate is unremarkable. Other: No abdominopelvic ascites. Musculoskeletal: Multifocal sclerotic osseous metastases throughout the visualized axial and appendicular skeleton. This is certainly more conspicuous than on prior CT, although the overall appearance is unchanged from MR, and may simply reflect treatment  related sclerosis. Mild superior endplate compression fracture deformity at L2 (sagittal image 96), unchanged, without retropulsion. IMPRESSION: Radiation changes in the medial right upper lobe. Underlying viable tumor is difficult to exclude. Continued attention on follow-up is suggested. Mild thoracic lymphadenopathy, improved. Multifocal hepatic metastases, mildly improved. Multifocal osseous metastases throughout the visualized axial and appendicular skeleton, more conspicuous on CT but unchanged from prior MR, favoring treatment related sclerosis. Associated pathologic fracture at T3 and L2, unchanged. Electronically Signed   By: SJulian HyM.D.   On: 09/13/2019 09:24   MR Brain W Wo Contrast  Result Date: 09/06/2019 CLINICAL DATA:  34year old male with widely metastatic lung cancer. Status post whole brain radiation in May. Restaging.  EXAM: MRI HEAD WITHOUT AND WITH CONTRAST TECHNIQUE: Multiplanar, multiecho pulse sequences of the brain and surrounding structures were obtained without and with intravenous contrast. CONTRAST:  9.26m GADAVIST GADOBUTROL 1 MMOL/ML IV SOLN COMPARISON:  Brain and orbit MRI 05/10/2019.  Head CT 06/17/2019. FINDINGS: Brain: This is the first 3 Tesla MRI of the brain on this patient. Postcontrast images demonstrate greater than 35 small enhancing brain metastases ranging from punctate 2 11 mm diameter. Notable lesions include the posteromedial right parietal lobe on series 10, image 35, superior left thalamus abutting the body of the lateral ventricle on series 11, image 18, right dorsal midbrain at the superior colliculus on series 10, image 21, cerebellar vermis on series 10, image 11. Despite the large number of enhancing metastases there is only mild scattered cerebral edema associated with the largest lesions. No intracranial mass effect or midline shift. Many of the metastases also now demonstrate microhemorrhage on SWI which is new since April and likely a post  radiation effect. No abnormal diffusion suggestive of ischemia or infarct. No ventriculomegaly. No acute intracranial hemorrhage. No dural or leptomeningeal thickening identified. Negative pituitary and cervicomedullary junction. Vascular: Major intracranial vascular flow voids are stable. The major dural venous sinuses are enhancing and appear to be patent. Skull and upper cervical spine: Cervical spine is reported separately today. Confluent bone metastasis throughout the clivus and the right occipital condyle is noted with superimposed inspissated T1 hyperintense secretions in the sphenoid sinuses. Sinuses/Orbits: Negative orbits. Inspissated secretions in the sphenoid sinuses. Other: Mild bilateral mastoid effusions. Grossly normal visible internal auditory structures. Scalp and face appear negative. IMPRESSION: 1. Status post whole brain radiation in May with a large number (>35) of small enhancing brain metastases on this first 3 Tesla Brain study for this patient. Brain mets range from punctate to 11 mm diameter. Many demonstrate micro-hemorrhage which is likely the sequelae of radiation. But only small areas of edema from the largest lesions, and no intracranial mass effect. 2. Confluent bone metastasis throughout the clivus and at the right occipital condyle. Superimposed inspissated T1 hyperintense secretions in the sphenoid sinuses. 3. Cervical spine MRI reported separately today. Electronically Signed   By: HGenevie AnnM.D.   On: 09/06/2019 22:16   MR CERVICAL SPINE WO CONTRAST  Result Date: 09/06/2019 CLINICAL DATA:  34year old male with widely metastatic lung cancer. Restaging. EXAM: MRI CERVICAL SPINE WITHOUT CONTRAST TECHNIQUE: Multiplanar, multisequence MR imaging of the cervical spine was performed. No intravenous contrast was administered. COMPARISON:  Cervical spine MRI 06/17/2019. FINDINGS: Alignment: Chronic straightening of lumbar lordosis, mild reversal compared to May. Vertebrae: Widespread  T1 hypointense bone metastases throughout the visible spine, and confluent involvement at the skull base including the entire clivus and right occipital condyle. In the cervical spine only the C1 vertebra appears spared. Extensive vertebral body and posterior element involvement elsewhere. The C3 body is relatively spared. No cervical vertebral pathologic fracture. There is evidence of spinous process extraosseous extension of tumor at C2, C3, C6 (series 3, images 10 and 11). No cervical spine epidural tumor identified. Confluent upper thoracic metastases at T1 (with some spinous process extraosseous tumor) and the posterior T2 body. The T3 pathologic fracture seen in May is not included today. Cord: Normal noncontrast MRI appearance of the cervical spinal cord. The visible T1 and T2 level cord also appears normal. Posterior Fossa, vertebral arteries, paraspinal tissues: Negative visible posterior fossa, brain parenchyma. Partially visible mastoid and paranasal sinus fluid. Preserved major vascular flow voids in the neck,  the right vertebral artery appears dominant as before. Disc levels: Stable small cervical disc protrusions at C3-C4 and C4-C5 without spinal stenosis. IMPRESSION: 1. Widespread osseous metastatic disease throughout the visible skull base and cervical spine, not significantly changed since May - although there does appear to be new extraosseous extension of tumor from several spinous processes. 2. There is no cervical spine epidural tumor or cord compression. Note that the T3 pathologic fracture seen in May is not included today. Electronically Signed   By: Genevie Ann M.D.   On: 09/06/2019 22:01   CT Abdomen Pelvis W Contrast  Result Date: 09/13/2019 CLINICAL DATA:  Metastatic lung cancer, chemotherapy/immunotherapy ongoing, XRT complete 05/2019. Mid chest pain, hemoptysis, weight loss. Right upper quadrant abdominal pain, constipation. EXAM: CT CHEST, ABDOMEN, AND PELVIS WITH CONTRAST TECHNIQUE:  Multidetector CT imaging of the chest, abdomen and pelvis was performed following the standard protocol during bolus administration of intravenous contrast. CONTRAST:  179m OMNIPAQUE IOHEXOL 300 MG/ML  SOLN COMPARISON:  CTA chest and CT abdomen/pelvis dated 06/17/2019. MRI thoracolumbar spine dated 06/17/2019. FINDINGS: CT CHEST FINDINGS Cardiovascular: Heart is normal in size.  No pericardial effusion. No evidence of thoracic aortic aneurysm. Right chest port terminates the cavoatrial junction. Mediastinum/Nodes: Improving thoracic lymphadenopathy. 10 mm short axis right paratracheal node (series 2/image 22), previously 16 mm. 15 mm short axis right hilar node (series 2/image 24), previously 25 mm. No suspicious axillary lymphadenopathy. Visualized thyroid is unremarkable. Lungs/Pleura: Suspected radiation changes in the medial right upper lobe (series 4/image 44). Given the residual masslike appearance (series 2/image 18), underlying viable tumor cannot be entirely excluded on this baseline post treatment evaluation. Lungs are otherwise clear. No pleural effusion or pneumothorax. Musculoskeletal: Multifocal sclerotic osseous metastases throughout the visualized axial and appendicular skeleton. This is certainly more conspicuous than on prior CT, although the overall appearance is unchanged from MR, and may simply reflect treatment related sclerosis. Moderate compression fracture deformity at T3 (sagittal image 100), minimally progressive. No retropulsion. CT ABDOMEN PELVIS FINDINGS Hepatobiliary: Numerous hepatic metastases in both lobes, approximately 30 in number. Index lesions include: --2.3 cm lesion in the medial aspect of the posterior right hepatic lobe (series 2/image 49), previously 2.9 cm --2.2 cm lesion in the central left hepatic lobe (series 2/image 48), previously 2.2 cm --2.6 cm lesion inferiorly in the posterior right hepatic lobe (series 2/image 67), previously 3.1 cm Overall, mild improvement is  suspected. Gallbladder is unremarkable. No intrahepatic or extrahepatic ductal dilatation. Pancreas: Within normal limits. Spleen: Normal limits. Adrenals/Urinary Tract: Adrenal glands are within normal limits. Subcentimeter right renal cyst (series 2/image 65). Left kidney is within normal limits. No hydronephrosis. Bladder is mildly thick-walled although underdistended. Stomach/Bowel: Stomach is within normal limits. No evidence of bowel obstruction. Normal appendix (series 2/image 103). Vascular/Lymphatic: No evidence of abdominal aortic aneurysm. No suspicious abdominopelvic lymphadenopathy. Reproductive: Prostate is unremarkable. Other: No abdominopelvic ascites. Musculoskeletal: Multifocal sclerotic osseous metastases throughout the visualized axial and appendicular skeleton. This is certainly more conspicuous than on prior CT, although the overall appearance is unchanged from MR, and may simply reflect treatment related sclerosis. Mild superior endplate compression fracture deformity at L2 (sagittal image 96), unchanged, without retropulsion. IMPRESSION: Radiation changes in the medial right upper lobe. Underlying viable tumor is difficult to exclude. Continued attention on follow-up is suggested. Mild thoracic lymphadenopathy, improved. Multifocal hepatic metastases, mildly improved. Multifocal osseous metastases throughout the visualized axial and appendicular skeleton, more conspicuous on CT but unchanged from prior MR, favoring treatment related sclerosis. Associated pathologic  fracture at T3 and L2, unchanged. Electronically Signed   By: Julian Hy M.D.   On: 09/13/2019 09:24    Assessment/Plan Hyperglycemia [R73.9]  William Ali CHETAN MEHRING is clinically stable today from a focal neurologic standpoint.  He is dealing with considerable treatment related fatigue, malaise that is well attributed to whole brain radiation and aggressive systemic chemotherapy regimen.  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  Brain MRI demonstrates good control of disease without appreciable inflammation.  Bony mets to the spine will continue to monitor as long as not symptomatic.  We recommended weaning decadron, starting with 73m daily.  In 7 days will decrease to 173mdaily, then stop in 7 days.  Will follow up with his PCP regarding hyperglycemia, insulin management after breakthrough insulin was administered today 10u.  We encouraged activity and engagement socially, physically.    We appreciate the opportunity to participate in the care of DaTwain  We ask that William J Isidoro Donningeturn to clinic in 3 months following next brain MRI, or sooner as needed.  All questions were answered. The patient knows to call the clinic with any problems, questions or concerns. No barriers to learning were detected.  The total time spent in the encounter was 40 minutes and more than 50% was on counseling and review of test results   ZaVentura SellersMD Medical Director of Neuro-Oncology [facility]8/19/21 4:43 PM

## 2019-09-14 NOTE — Progress Notes (Signed)
OK to treat with elevated liver enzymes and elevated heart rate of 110. per Dr. Irene Limbo. Recheck Glucose and notify Dr. Irene Limbo with results.   Repeat Glucose is 354.  Pt to receive 4 units regular insulin per Dr. Irene Limbo and recheck glucose in 2 hours. Patty Sermons, RN made aware

## 2019-09-15 ENCOUNTER — Ambulatory Visit (HOSPITAL_COMMUNITY): Payer: MEDICAID

## 2019-09-15 ENCOUNTER — Telehealth: Payer: Self-pay | Admitting: Hematology and Oncology

## 2019-09-15 ENCOUNTER — Telehealth: Payer: Self-pay | Admitting: Internal Medicine

## 2019-09-15 LAB — T4: T4, Total: 7.5 ug/dL (ref 4.5–12.0)

## 2019-09-15 NOTE — Telephone Encounter (Signed)
Rescheduled appt per 8/19 sch msg - pt request. Called pt - no answer, left message for patient with apt date and time

## 2019-09-15 NOTE — Telephone Encounter (Signed)
Scheduled appointment per 8/19 los. Left message for patient with appointment date and time.

## 2019-09-20 ENCOUNTER — Telehealth: Payer: Self-pay | Admitting: *Deleted

## 2019-09-20 NOTE — Telephone Encounter (Signed)
Received call from pt's wife, William Ali. She states that William Ali is having increased stomach and back pain, nausea. And occasional trouble swallowing. Reviewed pain meds with her.. It turns out that pt's fentanyl patch was removed last week during his bathing time and not replaced. He was on 100 mcg every 3 days.  So he has not had this for a week. He has increased his po Dilaudid to 2-3 times a day but still feels 'bad'. Advised that he cannot just stop the fentanyl patch all of the sudden . Explained that it needs to be tampered down slowly or he could experience drug withdrawal symptoms and increased pain.  Spoke to Dr. Lorenso Courier. About this. He agrees and asks that William Ali have another 100 mcg patch put back on immediately. This was explained to [facility] and she voiced understanding. Ex[plained that it may take up to 18 hours to reach peak effectiveness.  Advised to use the dilaudid in the meantime, following directions on that bottle.  She voiced understanding. Advised to call back if symptoms don't subside. She states she will call if she needs to

## 2019-09-21 ENCOUNTER — Other Ambulatory Visit: Payer: Self-pay | Admitting: Radiation Therapy

## 2019-09-22 ENCOUNTER — Other Ambulatory Visit: Payer: Self-pay

## 2019-09-22 ENCOUNTER — Encounter: Payer: Self-pay | Admitting: Pharmacy Technician

## 2019-09-22 ENCOUNTER — Encounter: Payer: Self-pay | Admitting: Hematology and Oncology

## 2019-09-22 ENCOUNTER — Telehealth: Payer: Self-pay | Admitting: Critical Care Medicine

## 2019-09-22 MED FILL — METOPROLOL SUCCINATE ER 50: 50 | 30 days supply | Qty: 30 | Fill #3

## 2019-09-22 NOTE — Progress Notes (Signed)
Patient no longer getting Keytruda from [employer] based on insurance coverage. Last DOS covered is 08/24/19. Patient no longer getting Alimta from [place]. based on insurance coverage. Last DOS covered is 08/24/19.

## 2019-09-22 NOTE — Telephone Encounter (Signed)
Called patient and LVM to return call and schedule a televisit with Dr. Joya Gaskins.

## 2019-09-24 MED ORDER — ONDANSETRON HCL 4 MG PO TABS: 4.0000 mg | ORAL_TABLET | Freq: Three times a day (TID) | ORAL | 0 refills | Status: DC | PRN

## 2019-09-24 NOTE — Telephone Encounter (Signed)
Please refill if appropriate

## 2019-09-25 ENCOUNTER — Ambulatory Visit: Payer: Self-pay | Admitting: *Deleted

## 2019-09-25 ENCOUNTER — Telehealth: Payer: Self-pay | Admitting: *Deleted

## 2019-09-25 ENCOUNTER — Telehealth: Payer: Self-pay | Admitting: Critical Care Medicine

## 2019-09-25 MED ORDER — LOSARTAN POTASSIUM 100 MG PO TABS: 100.0000 mg | ORAL_TABLET | Freq: Every day | ORAL | 1 refills | Status: DC

## 2019-09-25 MED FILL — ONDANSETRON HCL 4 MG TABS: 4 | 20 days supply | Qty: 60 | Fill #0

## 2019-09-25 NOTE — Telephone Encounter (Signed)
Two things please:   1  Please add the patient to my pm schedule tomorrow as a video/phone visit  2. Please call the patient and instruct him to increase toprol to 50mg  twice daily and Start Losartan 100mg  daily. Rx sent to his [facility]

## 2019-09-25 NOTE — Telephone Encounter (Signed)
Received call from pt's wife, Cindra Eves. She states that William Ali is having problem with blood pressure being elevated while PT was there. Spoke with PT, William Ali. She states William Ali's BP was 182/130 and apical pulse 128, reg while  sitting on the edge of the bed. They had not done type of therapy yet.  PT assisted pt to lay down and rechecked VS 10 minutes later and BP was 150/110 with pulse 117. Denied headaches or any other changes. He was having pain but not excessive,  Dr. Lorenso Courier made aware. Advised no PT today, take pain meds as ordered and to f/u with PCP regarding elevated BP/HR Anda Kraft and [place] voiced understanding

## 2019-09-25 NOTE — Telephone Encounter (Signed)
Physical Therapist at home visit with the patient. His B/P and HR elevated upon her arrival. initally 182/130 HR 128 bpm. Now 144/100 HR 117 while lying. Dnies all other symptoms than chronic blurred vision at this time. Took Toprol XL 50 one hour ago.Routing to physician now for further communication with patient if needed. Wife stated he was taking Toprol XL 50 mg twice daily while in hospital.   Routing to provider for further communication with the patient and his wife.   Reason for Disposition  [3] Systolic BP  >= 086 OR Diastolic >= 80 AND [5] history of heart problems, kidney disease or diabetes  Answer Assessment - Initial Assessment Questions 1. BLOOD PRESSURE: "What is the blood pressure?" "Did you take at least two measurements 5 minutes apart?"     B/p sitting 182/130 HR apical 128 bpm regular. Now HR 119 , 158/110 , lying 144/100, HR 117. 2. ONSET: "When did you take your blood pressure?"     Last pressure now. 3. HOW: "How did you obtain the blood pressure?" (e.g., visiting nurse, automatic home BP monitor)     Visiting RN sent by Oncology 4. HISTORY: "Do you have a history of high blood pressure?"     Yes 5. MEDICATIONS: "Are you taking any medications for blood pressure?" "Have you missed any doses recently?"  No     Took medication about one hour ago.  6. OTHER SYMPTOMS: "Do you have any symptoms?" (e.g., headache, chest pain, blurred vision, difficulty breathing, weakness)    Has blurred vision but is chronic-no changes today. 7. PREGNANCY: "Is there any chance you are pregnant?" "When was your last menstrual period?"  NA  Protocols used: BLOOD PRESSURE - HIGH-A-AH

## 2019-09-28 ENCOUNTER — Ambulatory Visit: Payer: PRIVATE HEALTH INSURANCE | Attending: Family | Admitting: Family

## 2019-09-28 ENCOUNTER — Other Ambulatory Visit: Payer: Self-pay

## 2019-09-28 DIAGNOSIS — Z5329 Procedure and treatment not carried out because of patient's decision for other reasons: Secondary | ICD-10-CM

## 2019-09-28 NOTE — Progress Notes (Signed)
Patient did not show for appointment.   

## 2019-10-05 ENCOUNTER — Ambulatory Visit: Payer: PRIVATE HEALTH INSURANCE

## 2019-10-05 ENCOUNTER — Other Ambulatory Visit: Payer: Self-pay | Admitting: *Deleted

## 2019-10-05 ENCOUNTER — Other Ambulatory Visit: Payer: PRIVATE HEALTH INSURANCE

## 2019-10-05 ENCOUNTER — Ambulatory Visit: Payer: PRIVATE HEALTH INSURANCE | Admitting: Hematology and Oncology

## 2019-10-05 DIAGNOSIS — Z95828 Presence of other vascular implants and grafts: Secondary | ICD-10-CM

## 2019-10-05 MED ORDER — HEPARIN SOD (PORK) LOCK FLUSH 100 UNIT/ML IV SOLN
500.0000 [IU] | Freq: Once | INTRAVENOUS | Status: DC
  Filled 2019-10-05: qty 5

## 2019-10-05 MED ORDER — SODIUM CHLORIDE 0.9% FLUSH
10.0000 mL | INTRAVENOUS | Status: DC | PRN
  Filled 2019-10-05: qty 10

## 2019-10-08 ENCOUNTER — Other Ambulatory Visit: Payer: Self-pay | Admitting: Hematology and Oncology

## 2019-10-08 DIAGNOSIS — C7931 Secondary malignant neoplasm of brain: Secondary | ICD-10-CM

## 2019-10-08 DIAGNOSIS — C7949 Secondary malignant neoplasm of other parts of nervous system: Secondary | ICD-10-CM

## 2019-10-08 NOTE — Progress Notes (Signed)
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 Short term f/u of this lesion was recommended. CXR on 05/01/2018 performed with recommended f/u imaging. 2) 05/10/2019: presented to the ED with headache and left pelvis pain. Patient underwent CT C/A/P and MRI brain. Brain MRI showed numerous enhancing parenchymal intracranial lesions likely reflecting metastases. CT scan showed cavitary lesion in the right lung apex with spiculated margins. Additional sites of disease include lymph nodes of the chest and numerous liver mets. In the bones there were multiple lytic appearing lesions throughout the thoracic and lumbar spine as well as portions of the bony pelvis. Referred to Oncology. 3) 05/16/2019: US biopsy of the liver performed. Findings consistent with a poorly differentiated adenocarcinoma.  4) 05/19/2019: establish care with Dr. Lorenso Courier.  5) 05/30/2019: CT simulation. Received Radiation to brain/spine.  5) 06/23/2019: Received Cycle 1 Day 1 of Carbo/Pemetrexed while inpatient. Pembrolizumab not able to be administered in inpatient setting. 6) 07/13/2019:Cycle 2 Day 1 of Carboplatin/Pemetrexed/Pembrolizumab 7)  08/04/2019:Cycle 3 day 1 of Carboplatin/Pemetrexed/Pembrolizumab 8) 08/24/2019: Cycle 4 day 1 of Carboplatin/Pemetrexed/Pembrolizumab 9) 09/14/2019: Cycle 5 day 1 of Carboplatin/Pemetrexed/Pembrolizumab 10) 10/09/2019: Cycle 6 day 1 of Pemetrexed/Pembrolizumab (held Carboplatin due to cytopenias. Intended to begin holding on cycle 7  Interval History:  William Ali 34 y.o. male with medical history significant for metastatic adenocarcinoma of the lung who presents for a follow up visit.  The patient's  last visit was on 09/08/2019 prior to Cycle 5 day 1 of chemotherapy on 09/14/2019.   On exam today William Ali notes he has been having continued issues with pain.  He notes that his pain is about 8 out of 10 in severity dominantly in his back.  He notes that he recently had a friend over who did not realize the severity of his condition and slept in the stomach which caused abdominal pain which has persisted.  He notes that he is having muscle spasms and shakes that have been associated with the increase in the dose of his fentanyl patches.  He notes he has been using his Dilaudid p.o. sparingly and had only used it 3 times in the last week.  On further discussion he notes that he is having sensitivity and discomfort in his teeth.  He notes he has not been able to establish with a dentist yet to give him clearance for Zometa.  He is also had persistent swelling in his neck as well as a new pain on his left heel.  His appetite has declined, however his weight has remained stable.  He has a vomit bag with him today as a result of his Fick's elevations.  He has had no issues with nausea, vomiting, or diarrhea.  He denies having any fevers, chills, sweats.  He does occasionally have symptoms of the shortness of breath, but is not having any cough.  He has been tolerating his Xarelto therapy well without any bleeding, or bruising.  A full 10 point ROS is listed below.  MEDICAL HISTORY:  Past Medical History:  Diagnosis Date  . Back pain   . Hypertension   . met lung ca to liver, spine, and brain dx'd 03/2019    SURGICAL HISTORY: Past Surgical History:  Procedure Laterality Date  .  IR IMAGING GUIDED PORT INSERTION  06/12/2019  . KNEE SURGERY      SOCIAL HISTORY: Social History   Socioeconomic History  . Marital status: Married    Spouse name: Not on file  . Number of children: Not on file  . Years of education: Not on file  . Highest education level: Not on file  Occupational History  . Not on  file  Tobacco Use  . Smoking status: Never Smoker  . Smokeless tobacco: Never Used  Vaping Use  . Vaping Use: Never used  Substance and Sexual Activity  . Alcohol use: No  . Drug use: No  . Sexual activity: Yes  Other Topics Concern  . Not on file  Social History Narrative  . Not on file   Social Determinants of Health   Financial Resource Strain:   . Difficulty of Paying Living Expenses: Not on file  Food Insecurity:   . Worried About Charity fundraiser in the Last Year: Not on file  . Ran Out of Food in the Last Year: Not on file  Transportation Needs:   . Lack of Transportation (Medical): Not on file  . Lack of Transportation (Non-Medical): Not on file  Physical Activity:   . Days of Exercise per Week: Not on file  . Minutes of Exercise per Session: Not on file  Stress:   . Feeling of Stress : Not on file  Social Connections:   . Frequency of Communication with Friends and Family: Not on file  . Frequency of Social Gatherings with Friends and Family: Not on file  . Attends Religious Services: Not on file  . Active Member of Clubs or Organizations: Not on file  . Attends Archivist Meetings: Not on file  . Marital Status: Not on file  Intimate Partner Violence:   . Fear of Current or Ex-Partner: Not on file  . Emotionally Abused: Not on file  . Physically Abused: Not on file  . Sexually Abused: Not on file    FAMILY HISTORY: Family History  Problem Relation Age of Onset  . Hypertension Mother   . Diabetes Father     ALLERGIES:  is allergic to onion, other, peanut oil, and peanut-containing drug products.  MEDICATIONS:  Current Outpatient Medications  Medication Sig Dispense Refill  . albuterol (VENTOLIN HFA) 108 (90 Base) MCG/ACT inhaler TAKE 2 PUFFS BY MOUTH EVERY 6 HOURS AS NEEDED FOR WHEEZE OR SHORTNESS OF BREATH 6.7 g 1  . alum & mag hydroxide-simeth (MAALOX MULTI SYMPTOM MAX ST) 400-400-40 MG/5ML suspension Take 10 mLs by mouth every 6 (six)  hours as needed for indigestion. 355 mL 2  . dexamethasone (DECADRON) 1 MG tablet Take 1 tablet (1 mg total) by mouth daily. Starting on 5/11, and drop dose to once daily on 5/14 30 tablet 0  . DULoxetine (CYMBALTA) 30 MG capsule Take 1 capsule (30 mg total) by mouth daily. 30 capsule 3  . fentaNYL (DURAGESIC) 100 MCG/HR Place 1 patch onto the skin every 3 (three) days. 10 patch 0  . folic acid (FOLVITE) 1 MG tablet Take 1 tablet (1 mg total) by mouth daily. 60 tablet 3  . HYDROmorphone (DILAUDID) 8 MG tablet Take 1/2- 1 tablet every 4 hours as needed for pain 90 tablet 0  . lidocaine-prilocaine (EMLA) cream Apply 1 application topically as needed. 30 g 2  . LORazepam (ATIVAN) 1 MG tablet Take 1 tablet (1 mg total) by mouth as directed. Please take 30 to 60  minutes prior to CT scans. 1 tablet 0  . losartan (COZAAR) 100 MG tablet Take 1 tablet (100 mg total) by mouth daily. 40 tablet 1  . metoprolol succinate (TOPROL XL) 50 MG 24 hr tablet Take 1 tablet (50 mg total) by mouth daily. Take with or immediately following a meal. 30 tablet 11  . ondansetron (ZOFRAN) 4 MG tablet Take 1 tablet (4 mg total) by mouth every 8 (eight) hours as needed for nausea or vomiting. 60 tablet 0  . pantoprazole (PROTONIX) 40 MG tablet Take 1 tablet (40 mg total) by mouth 2 (two) times daily. 120 tablet 3  . polyethylene glycol (MIRALAX / GLYCOLAX) 17 g packet Take 17 g by mouth daily. 14 each 0  . rivaroxaban (XARELTO) 20 MG TABS tablet Take 1 tablet (20 mg total) by mouth daily with supper. 90 tablet 1  . senna-docusate (SENOKOT-S) 8.6-50 MG tablet Take 2 tablets by mouth 2 (two) times daily. (Patient taking differently: Take 3 tablets by mouth at bedtime. ) 120 tablet 0   No current facility-administered medications for this visit.    REVIEW OF SYSTEMS:   Constitutional: ( - ) fevers, ( - )  chills , ( - ) night sweats Eyes: ( - ) blurriness of vision, ( - ) double vision, ( - ) watery eyes Ears, nose, mouth,  throat, and face: ( - ) mucositis, ( - ) sore throat Respiratory: ( - ) cough, ( - ) dyspnea, ( - ) wheezes Cardiovascular: ( - ) palpitation, ( - ) chest discomfort, ( - ) lower extremity swelling Gastrointestinal:  ( - ) nausea, ( - ) heartburn, ( - ) change in bowel habits Skin: ( - ) abnormal skin rashes Lymphatics: ( - ) new lymphadenopathy, ( - ) easy bruising Neurological: ( - ) numbness, ( - ) tingling, ( - ) new weaknesses Behavioral/Psych: ( - ) mood change, ( - ) new changes  All other systems were reviewed with the patient and are negative.  PHYSICAL EXAMINATION: ECOG PERFORMANCE STATUS: 2 - Symptomatic, <50% confined to bed  There were no vitals filed for this visit. There were no vitals filed for this visit.  GENERAL: well appearing young African American male in NAD  SKIN: skin color, texture, turgor are normal, no rashes or significant lesions NECK: supple, nontender LYMPH: fullness of the cervical lymph nodes bilaterally. No clear axillary lymph nodes. Stable from prior EYES: conjunctiva are pink and non-injected, sclera clear LUNGS: clear to auscultation and percussion with normal breathing effort HEART: regular rate & rhythm and no murmurs and no lower extremity edema Musculoskeletal: no cyanosis of digits and no clubbing. Increased swelling inferior to axilla bilaterally. Also noted to have soft tissue swelling in left leg  PSYCH: alert & oriented x 3, fluent speech NEURO: no focal motor/sensory deficits  LABORATORY DATA:  I have reviewed the data as listed CBC Latest Ref Rng & Units 09/14/2019 09/08/2019 08/24/2019  WBC 4.0 - 10.5 K/uL 4.6 2.9(L) 3.8(L)  Hemoglobin 13.0 - 17.0 g/dL 9.8(L) 9.5(L) 9.7(L)  Hematocrit 39 - 52 % 32.2(L) 31.0(L) 31.6(L)  Platelets 150 - 400 K/uL 120(L) 87(L) 100(L)    CMP Latest Ref Rng & Units 09/14/2019 09/08/2019 08/24/2019  Glucose 70 - 99 mg/dL 388(H) 225(H) 152(H)  BUN 6 - 20 mg/dL 25(H) 22(H) 22(H)  Creatinine 0.61 - 1.24 mg/dL  1.39(H) 1.22 1.24  Sodium 135 - 145 mmol/L 138 141 142  Potassium 3.5 - 5.1 mmol/L 4.3 3.8 4.2  Chloride 98 - 111 mmol/L 104 107 109  CO2 22 - 32 mmol/L 22 20(L) 23  Calcium 8.9 - 10.3 mg/dL 9.6 8.9 9.8  Total Protein 6.5 - 8.1 g/dL 7.0 6.8 7.0  Total Bilirubin 0.3 - 1.2 mg/dL 0.4 0.4 0.3  Alkaline Phos 38 - 126 U/L 530(H) 452(H) 505(H)  AST 15 - 41 U/L 47(H) 44(H) 51(H)  ALT 0 - 44 U/L 165(H) 130(H) 165(H)    RADIOGRAPHIC STUDIES: CT Soft Tissue Neck W Contrast  Result Date: 09/12/2019 CLINICAL DATA:  Primary malignant neoplasm of right lung metastatic to other site. Lymphadenopathy, neck. Additional history provided: Ongoing chemotherapy, immunotherapy, radiation therapy completed May 2021, left lateral neck swelling for several weeks. EXAM: CT NECK WITH CONTRAST TECHNIQUE: Multidetector CT imaging of the neck was performed using the standard protocol following the bolus administration of intravenous contrast. CONTRAST:  126m OMNIPAQUE IOHEXOL 300 MG/ML  SOLN COMPARISON:  Concurrently performed chest CT 09/12/2019, cervical spine MRI 09/06/2018, brain MRI 09/06/2019 FINDINGS: Pharynx and larynx: Poor dentition with multiple carious and absent teeth no appreciable discrete mass or swelling within the visualized oral cavity, pharynx or larynx. Salivary glands: Fatty atrophy of the parotid glands. The submandibular glands are unremarkable. Thyroid: Thyroid unremarkable. Lymph nodes: No pathologically enlarged cervical chain lymph nodes are identified. Vascular: The internal jugular veins are diffusely narrowed in appearance, but patent bilaterally. Limited intracranial: The patient has numerous known intracranial metastatic lesions demonstrated on the prior brain MRI of 09/06/2019. Visualized orbits: Incompletely imaged. Visualized orbits show no acute finding. Mastoids and visualized paranasal sinuses: Complete opacification of the right sphenoid sinus. Extensive partial opacification of the left  sphenoid sinus. No significant mastoid effusion Skeleton: Redemonstrated widespread osseous metastatic disease within the skull base, cervical spine and visualized upper thoracic spine. Upper chest: Reported separately. Partially visualized right IJ approach infusion port catheter. IMPRESSION: No pathologically enlarged cervical chain lymph nodes. Redemonstrated widespread osseous metastatic disease within the skull base, cervical spine and visualized upper thoracic spine. Sphenoid sinusitis. Please refer to the concurrently performed and separately reported chest CT for a description of intrathoracic findings. Electronically Signed   By: KKellie SimmeringDO   On: 09/12/2019 21:24   CT Chest W Contrast  Result Date: 09/13/2019 CLINICAL DATA:  Metastatic lung cancer, chemotherapy/immunotherapy ongoing, XRT complete 05/2019. Mid chest pain, hemoptysis, weight loss. Right upper quadrant abdominal pain, constipation. EXAM: CT CHEST, ABDOMEN, AND PELVIS WITH CONTRAST TECHNIQUE: Multidetector CT imaging of the chest, abdomen and pelvis was performed following the standard protocol during bolus administration of intravenous contrast. CONTRAST:  1044mOMNIPAQUE IOHEXOL 300 MG/ML  SOLN COMPARISON:  CTA chest and CT abdomen/pelvis dated 06/17/2019. MRI thoracolumbar spine dated 06/17/2019. FINDINGS: CT CHEST FINDINGS Cardiovascular: Heart is normal in size.  No pericardial effusion. No evidence of thoracic aortic aneurysm. Right chest port terminates the cavoatrial junction. Mediastinum/Nodes: Improving thoracic lymphadenopathy. 10 mm short axis right paratracheal node (series 2/image 22), previously 16 mm. 15 mm short axis right hilar node (series 2/image 24), previously 25 mm. No suspicious axillary lymphadenopathy. Visualized thyroid is unremarkable. Lungs/Pleura: Suspected radiation changes in the medial right upper lobe (series 4/image 44). Given the residual masslike appearance (series 2/image 18), underlying viable  tumor cannot be entirely excluded on this baseline post treatment evaluation. Lungs are otherwise clear. No pleural effusion or pneumothorax. Musculoskeletal: Multifocal sclerotic osseous metastases throughout the visualized axial and appendicular skeleton. This is certainly more conspicuous than on prior CT, although the overall appearance is unchanged from MR, and  may simply reflect treatment related sclerosis. Moderate compression fracture deformity at T3 (sagittal image 100), minimally progressive. No retropulsion. CT ABDOMEN PELVIS FINDINGS Hepatobiliary: Numerous hepatic metastases in both lobes, approximately 30 in number. Index lesions include: --2.3 cm lesion in the medial aspect of the posterior right hepatic lobe (series 2/image 49), previously 2.9 cm --2.2 cm lesion in the central left hepatic lobe (series 2/image 48), previously 2.2 cm --2.6 cm lesion inferiorly in the posterior right hepatic lobe (series 2/image 67), previously 3.1 cm Overall, mild improvement is suspected. Gallbladder is unremarkable. No intrahepatic or extrahepatic ductal dilatation. Pancreas: Within normal limits. Spleen: Normal limits. Adrenals/Urinary Tract: Adrenal glands are within normal limits. Subcentimeter right renal cyst (series 2/image 65). Left kidney is within normal limits. No hydronephrosis. Bladder is mildly thick-walled although underdistended. Stomach/Bowel: Stomach is within normal limits. No evidence of bowel obstruction. Normal appendix (series 2/image 103). Vascular/Lymphatic: No evidence of abdominal aortic aneurysm. No suspicious abdominopelvic lymphadenopathy. Reproductive: Prostate is unremarkable. Other: No abdominopelvic ascites. Musculoskeletal: Multifocal sclerotic osseous metastases throughout the visualized axial and appendicular skeleton. This is certainly more conspicuous than on prior CT, although the overall appearance is unchanged from MR, and may simply reflect treatment related sclerosis. Mild  superior endplate compression fracture deformity at L2 (sagittal image 96), unchanged, without retropulsion. IMPRESSION: Radiation changes in the medial right upper lobe. Underlying viable tumor is difficult to exclude. Continued attention on follow-up is suggested. Mild thoracic lymphadenopathy, improved. Multifocal hepatic metastases, mildly improved. Multifocal osseous metastases throughout the visualized axial and appendicular skeleton, more conspicuous on CT but unchanged from prior MR, favoring treatment related sclerosis. Associated pathologic fracture at T3 and L2, unchanged. Electronically Signed   By: Julian Hy M.D.   On: 09/13/2019 09:24   CT Abdomen Pelvis W Contrast  Result Date: 09/13/2019 CLINICAL DATA:  Metastatic lung cancer, chemotherapy/immunotherapy ongoing, XRT complete 05/2019. Mid chest pain, hemoptysis, weight loss. Right upper quadrant abdominal pain, constipation. EXAM: CT CHEST, ABDOMEN, AND PELVIS WITH CONTRAST TECHNIQUE: Multidetector CT imaging of the chest, abdomen and pelvis was performed following the standard protocol during bolus administration of intravenous contrast. CONTRAST:  167m OMNIPAQUE IOHEXOL 300 MG/ML  SOLN COMPARISON:  CTA chest and CT abdomen/pelvis dated 06/17/2019. MRI thoracolumbar spine dated 06/17/2019. FINDINGS: CT CHEST FINDINGS Cardiovascular: Heart is normal in size.  No pericardial effusion. No evidence of thoracic aortic aneurysm. Right chest port terminates the cavoatrial junction. Mediastinum/Nodes: Improving thoracic lymphadenopathy. 10 mm short axis right paratracheal node (series 2/image 22), previously 16 mm. 15 mm short axis right hilar node (series 2/image 24), previously 25 mm. No suspicious axillary lymphadenopathy. Visualized thyroid is unremarkable. Lungs/Pleura: Suspected radiation changes in the medial right upper lobe (series 4/image 44). Given the residual masslike appearance (series 2/image 18), underlying viable tumor cannot be  entirely excluded on this baseline post treatment evaluation. Lungs are otherwise clear. No pleural effusion or pneumothorax. Musculoskeletal: Multifocal sclerotic osseous metastases throughout the visualized axial and appendicular skeleton. This is certainly more conspicuous than on prior CT, although the overall appearance is unchanged from MR, and may simply reflect treatment related sclerosis. Moderate compression fracture deformity at T3 (sagittal image 100), minimally progressive. No retropulsion. CT ABDOMEN PELVIS FINDINGS Hepatobiliary: Numerous hepatic metastases in both lobes, approximately 30 in number. Index lesions include: --2.3 cm lesion in the medial aspect of the posterior right hepatic lobe (series 2/image 49), previously 2.9 cm --2.2 cm lesion in the central left hepatic lobe (series 2/image 48), previously 2.2 cm --2.6 cm lesion inferiorly  in the posterior right hepatic lobe (series 2/image 67), previously 3.1 cm Overall, mild improvement is suspected. Gallbladder is unremarkable. No intrahepatic or extrahepatic ductal dilatation. Pancreas: Within normal limits. Spleen: Normal limits. Adrenals/Urinary Tract: Adrenal glands are within normal limits. Subcentimeter right renal cyst (series 2/image 65). Left kidney is within normal limits. No hydronephrosis. Bladder is mildly thick-walled although underdistended. Stomach/Bowel: Stomach is within normal limits. No evidence of bowel obstruction. Normal appendix (series 2/image 103). Vascular/Lymphatic: No evidence of abdominal aortic aneurysm. No suspicious abdominopelvic lymphadenopathy. Reproductive: Prostate is unremarkable. Other: No abdominopelvic ascites. Musculoskeletal: Multifocal sclerotic osseous metastases throughout the visualized axial and appendicular skeleton. This is certainly more conspicuous than on prior CT, although the overall appearance is unchanged from MR, and may simply reflect treatment related sclerosis. Mild superior  endplate compression fracture deformity at L2 (sagittal image 96), unchanged, without retropulsion. IMPRESSION: Radiation changes in the medial right upper lobe. Underlying viable tumor is difficult to exclude. Continued attention on follow-up is suggested. Mild thoracic lymphadenopathy, improved. Multifocal hepatic metastases, mildly improved. Multifocal osseous metastases throughout the visualized axial and appendicular skeleton, more conspicuous on CT but unchanged from prior MR, favoring treatment related sclerosis. Associated pathologic fracture at T3 and L2, unchanged. Electronically Signed   By: Julian Hy M.D.   On: 09/13/2019 09:24    ASSESSMENT & PLAN William Ali 34 y.o. male with medical history significant for metastatic adenocarcinoma of the lung who presents for a follow up visit prior to Cycle 6 of chemotherapy.   The patient successfully completed Cycle 1 of carboplatin and pemetrexed, started on 06/23/2019.  Unfortunately this had to be administered in the inpatient setting because the patient was admitted for pain control.  As we were unsure when he would be discharged we decided to move forward with the treatment at that time.  Pembrolizumab was not able to be administered in house due to logistical and insurance issues.  He received pembrolizumab during Cycle 2 which started on 07/13/2019. Today marks Cycle 6 Day 1.  On exam today William Ali is stable other than his increased pain and muscle spasms.  His weight is stable, however his labs fail to have entirely improved from his last visit.  As such we will be holding his carboplatin today and proceeding with pemetrexed pembrolizumab.  The plan was initially to transition to pembrolizumab and pemetrexed without the carboplatin during his next cycle anyway.  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 This will give [place] therapeutic options later in his treatment course.  Previously I made it clear to William Ali that all treatments for administering are palliative in nature and designed to shrink the tumor and reduce symptoms.  The goal is to extend life and provide him some quality of life.  Once again we reiterated that there is no cure for his disease and that all steps moving forward should be made with this in mind.  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 Pathology could not definitively say, but he had a prior nodule on CXR and mass in lung appears to be the primary lesion -- Guardant 360 with actionable EGFR mutation noted.  --started Carbo/Pem/Pem (regimen detailed above) Cycle 1 Day 1 on 06/23/2019. Cycle 1 administered inpatient, pembrolizumab was not able to be added at that time. Cycle 2 (07/13/2019) included pembroliuzmab, which  will be included in all subsequent cycles.  --repeat CT scan on 09/12/2019 showed interval response to therapy. His next scan is due in Nov 2021.  --pain control regimen as listed below   --RTC prior to Day 1 Cycle 7 ( in 3 weeks) or sooner if he is having new or worsening symptoms.  #Facial Swelling, stable #Cervical Fullness/Lymphadenopathy, stable --bilateral facial swelling, most pronounced in the neck bilaterally. Some lid edema and facial fullness with no marked increase in arm swelling --no worsening abdominal or LE swelling. No difficultly breathing or swallowing.  --on exam today the cervical regions bilaterally felt stable.   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 and radiation oncology are following, Appreciate assistance in management of brain mets.  --pain control as noted below.  #Pain Control, worsening --pain is predominately in the back and  abdomen. Of note, headaches have resloved --patient is taking dexamethasone 27m PO daily  --decrease fentanyl patches to 75 mcg q 72H due to worsening muscle spasms --dilaudid PO [id number] PRN (usage increasing) --additionally is taking cymablata 3076mO and celebrex 200m80mily  --senna docusate/PRN miralax for opioid induced constipation  #Osseus Lesions/Hip Pain --dilaudid as above --encourage dental evaluation (has yet to be done) --continue IV zometa therapy for bone protection. Started 07/13/2019, to continue q 3 months. Next due Sept 2021.   #Left Eye Vision Changes --[facility] vs occipital lobe lesion --currently using eye patch over left eye.  --continue to monitor   # Weight Loss, stable --weight today is stable from prior  --encourage high calorie diet --possible increase appetite and weight from steroid therapy --continue to monitor  #Pulmonary Embolism --continue Xarelto 20mg74mdaily   No orders of the defined types were placed in this encounter.  All questions were answered. The patient knows to call the clinic with any problems, questions or concerns.  A total of more than 30 minutes were spent on this encounter and over half of that time was spent on counseling and coordination of care as outlined above.   Jakeya Gherardi Ledell PeoplesDepartment of Hematology/Oncology [facility]: [phone]: [phone]: [email or web address]  10/08/2019 9:32 PM

## 2019-10-09 ENCOUNTER — Inpatient Hospital Stay: Payer: Medicaid Other

## 2019-10-09 ENCOUNTER — Inpatient Hospital Stay (HOSPITAL_BASED_OUTPATIENT_CLINIC_OR_DEPARTMENT_OTHER): Payer: Medicaid Other | Admitting: Hematology and Oncology

## 2019-10-09 ENCOUNTER — Encounter: Payer: Self-pay | Admitting: Hematology and Oncology

## 2019-10-09 ENCOUNTER — Other Ambulatory Visit: Payer: Self-pay

## 2019-10-09 ENCOUNTER — Inpatient Hospital Stay: Payer: Medicaid Other | Attending: Hematology and Oncology

## 2019-10-09 VITALS — HR 98

## 2019-10-09 VITALS — BP 143/89 | HR 125 | Temp 97.6°F | Resp 20 | Ht 68.0 in | Wt 210.3 lb

## 2019-10-09 DIAGNOSIS — C7931 Secondary malignant neoplasm of brain: Secondary | ICD-10-CM | POA: Insufficient documentation

## 2019-10-09 DIAGNOSIS — Z79899 Other long term (current) drug therapy: Secondary | ICD-10-CM | POA: Diagnosis not present

## 2019-10-09 DIAGNOSIS — Z7901 Long term (current) use of anticoagulants: Secondary | ICD-10-CM | POA: Diagnosis not present

## 2019-10-09 DIAGNOSIS — C787 Secondary malignant neoplasm of liver and intrahepatic bile duct: Secondary | ICD-10-CM | POA: Insufficient documentation

## 2019-10-09 DIAGNOSIS — C7951 Secondary malignant neoplasm of bone: Secondary | ICD-10-CM | POA: Insufficient documentation

## 2019-10-09 DIAGNOSIS — C7949 Secondary malignant neoplasm of other parts of nervous system: Secondary | ICD-10-CM

## 2019-10-09 DIAGNOSIS — Z86711 Personal history of pulmonary embolism: Secondary | ICD-10-CM | POA: Insufficient documentation

## 2019-10-09 DIAGNOSIS — C3491 Malignant neoplasm of unspecified part of right bronchus or lung: Secondary | ICD-10-CM

## 2019-10-09 DIAGNOSIS — R000 Tachycardia, unspecified: Secondary | ICD-10-CM | POA: Diagnosis not present

## 2019-10-09 DIAGNOSIS — Z923 Personal history of irradiation: Secondary | ICD-10-CM | POA: Insufficient documentation

## 2019-10-09 DIAGNOSIS — R220 Localized swelling, mass and lump, head: Secondary | ICD-10-CM | POA: Insufficient documentation

## 2019-10-09 DIAGNOSIS — Z5111 Encounter for antineoplastic chemotherapy: Secondary | ICD-10-CM | POA: Diagnosis present

## 2019-10-09 DIAGNOSIS — A4189 Other specified sepsis: Secondary | ICD-10-CM | POA: Diagnosis not present

## 2019-10-09 LAB — CMP (CANCER CENTER ONLY)
ALT: 160 U/L — ABNORMAL HIGH (ref 0–44)
AST: 117 U/L — ABNORMAL HIGH (ref 15–41)
Albumin: 3 g/dL — ABNORMAL LOW (ref 3.5–5.0)
Alkaline Phosphatase: 596 U/L — ABNORMAL HIGH (ref 38–126)
Anion gap: 12 (ref 5–15)
BUN: 8 mg/dL (ref 6–20)
CO2: 24 mmol/L (ref 22–32)
Calcium: 8.5 mg/dL — ABNORMAL LOW (ref 8.9–10.3)
Chloride: 109 mmol/L (ref 98–111)
Creatinine: 1.08 mg/dL (ref 0.61–1.24)
GFR, Est AFR Am: >60 mL/min (ref >60)
GFR, Estimated: >60 mL/min (ref >60)
Glucose, Bld: 139 mg/dL — ABNORMAL HIGH (ref 70–99)
Potassium: 3.6 mmol/L (ref 3.5–5.1)
Sodium: 145 mmol/L (ref 135–145)
Total Bilirubin: 0.5 mg/dL (ref 0.3–1.2)
Total Protein: 6.4 g/dL — ABNORMAL LOW (ref 6.5–8.1)

## 2019-10-09 LAB — CBC WITH DIFFERENTIAL (CANCER CENTER ONLY)
Abs Immature Granulocytes: 0.03 10*3/uL (ref 0.00–0.07)
Basophils Absolute: 0 10*3/uL (ref 0.0–0.1)
Basophils Relative: 0 %
Eosinophils Absolute: 0 10*3/uL (ref 0.0–0.5)
Eosinophils Relative: 0 %
HCT: 29.5 % — ABNORMAL LOW (ref 39.0–52.0)
Hemoglobin: 8.7 g/dL — ABNORMAL LOW (ref 13.0–17.0)
Immature Granulocytes: 1 %
Lymphocytes Relative: 10 %
Lymphs Abs: 0.3 10*3/uL — ABNORMAL LOW (ref 0.7–4.0)
MCH: 29.7 pg (ref 26.0–34.0)
MCHC: 29.5 g/dL — ABNORMAL LOW (ref 30.0–36.0)
MCV: 100.7 fL — ABNORMAL HIGH (ref 80.0–100.0)
Monocytes Absolute: 0.7 10*3/uL (ref 0.1–1.0)
Monocytes Relative: 23 %
Neutro Abs: 2 10*3/uL (ref 1.7–7.7)
Neutrophils Relative %: 66 %
Platelet Count: 146 10*3/uL — ABNORMAL LOW (ref 150–400)
RBC: 2.93 MIL/uL — ABNORMAL LOW (ref 4.22–5.81)
RDW: 17.2 % — ABNORMAL HIGH (ref 11.5–15.5)
WBC Count: 3.1 10*3/uL — ABNORMAL LOW (ref 4.0–10.5)
nRBC: 1.6 % — ABNORMAL HIGH (ref 0.0–0.2)

## 2019-10-09 LAB — TSH: TSH: 2.794 u[IU]/mL (ref 0.320–4.118)

## 2019-10-09 MED ORDER — SODIUM CHLORIDE 0.9 % IV SOLN
500.0000 mg/m2 | Freq: Once | INTRAVENOUS | Status: AC
  Administered 2019-10-09: 1100 mg via INTRAVENOUS
  Filled 2019-10-09: qty 40

## 2019-10-09 MED ORDER — HYDROMORPHONE HCL 1 MG/ML IJ SOLN
2.0000 mg | Freq: Once | INTRAMUSCULAR | Status: AC
  Administered 2019-10-09: 2 mg via INTRAVENOUS

## 2019-10-09 MED ORDER — CYANOCOBALAMIN 1000 MCG/ML IJ SOLN
INTRAMUSCULAR | Status: AC
  Filled 2019-10-09: qty 1

## 2019-10-09 MED ORDER — PROCHLORPERAZINE MALEATE 10 MG PO TABS
ORAL_TABLET | ORAL | Status: AC
  Filled 2019-10-09: qty 1

## 2019-10-09 MED ORDER — PALONOSETRON HCL INJECTION 0.25 MG/5ML: 0.2500 mg | Freq: Once | INTRAVENOUS | Status: DC

## 2019-10-09 MED ORDER — SODIUM CHLORIDE 0.9% FLUSH
10.0000 mL | Freq: Once | INTRAVENOUS | Status: AC | PRN
  Administered 2019-10-09: 10 mL
  Filled 2019-10-09: qty 10

## 2019-10-09 MED ORDER — SODIUM CHLORIDE 0.9 % IV SOLN
Freq: Once | INTRAVENOUS | Status: AC
  Filled 2019-10-09: qty 250

## 2019-10-09 MED ORDER — SODIUM CHLORIDE 0.9 % IV SOLN: 150.0000 mg | Freq: Once | INTRAVENOUS | Status: DC

## 2019-10-09 MED ORDER — SODIUM CHLORIDE 0.9% FLUSH
10.0000 mL | INTRAVENOUS | Status: DC | PRN
  Administered 2019-10-09: 10 mL
  Filled 2019-10-09: qty 10

## 2019-10-09 MED ORDER — FENTANYL 75 MCG/HR TD PT72: 1.0000 | MEDICATED_PATCH | TRANSDERMAL | 0 refills | Status: DC

## 2019-10-09 MED ORDER — HYDROMORPHONE HCL 1 MG/ML IJ SOLN
INTRAMUSCULAR | Status: AC
  Filled 2019-10-09: qty 2

## 2019-10-09 MED ORDER — SODIUM CHLORIDE 0.9 % IV SOLN
200.0000 mg | Freq: Once | INTRAVENOUS | Status: AC
  Administered 2019-10-09: 200 mg via INTRAVENOUS
  Filled 2019-10-09: qty 8

## 2019-10-09 MED ORDER — SODIUM CHLORIDE 0.9 % IV SOLN: 10.0000 mg | Freq: Once | INTRAVENOUS | Status: DC

## 2019-10-09 MED ORDER — HEPARIN SOD (PORK) LOCK FLUSH 100 UNIT/ML IV SOLN
500.0000 [IU] | Freq: Once | INTRAVENOUS | Status: AC | PRN
  Administered 2019-10-09: 500 [IU]
  Filled 2019-10-09: qty 5

## 2019-10-09 MED ORDER — FENTANYL 75 MCG/HR TD PT72: 1.0000 | MEDICATED_PATCH | TRANSDERMAL | 0 refills | Status: AC

## 2019-10-09 MED ORDER — PALONOSETRON HCL INJECTION 0.25 MG/5ML
INTRAVENOUS | Status: AC
  Filled 2019-10-09: qty 5

## 2019-10-09 MED ORDER — PROCHLORPERAZINE MALEATE 10 MG PO TABS
10.0000 mg | ORAL_TABLET | Freq: Once | ORAL | Status: AC
  Administered 2019-10-09: 10 mg via ORAL

## 2019-10-09 MED ORDER — CYANOCOBALAMIN 1000 MCG/ML IJ SOLN
1000.0000 ug | Freq: Once | INTRAMUSCULAR | Status: AC
  Administered 2019-10-09: 1000 ug via INTRAMUSCULAR

## 2019-10-09 NOTE — Patient Instructions (Signed)

## 2019-10-09 NOTE — Progress Notes (Signed)
Holding zometa today per Dr Lorenso Courier.

## 2019-10-09 NOTE — Patient Instructions (Signed)
[facility] Discharge Instructions for Patients Receiving Chemotherapy  Today you received the following chemotherapy agents: pembrolizumab Beryle Flock) and pemetrexed (alimta).  To help prevent nausea and vomiting after your treatment, we encourage you to take your nausea medication as directed.   If you develop nausea and vomiting that is not controlled by your nausea medication, call the clinic.   BELOW ARE SYMPTOMS THAT SHOULD BE REPORTED IMMEDIATELY:  *FEVER GREATER THAN 100.5 F  *CHILLS WITH OR WITHOUT FEVER  NAUSEA AND VOMITING THAT IS NOT CONTROLLED WITH YOUR NAUSEA MEDICATION  *UNUSUAL SHORTNESS OF BREATH  *UNUSUAL BRUISING OR BLEEDING  TENDERNESS IN MOUTH AND THROAT WITH OR WITHOUT PRESENCE OF ULCERS  *URINARY PROBLEMS  *BOWEL PROBLEMS  UNUSUAL RASH Items with * indicate a potential emergency and should be followed up as soon as possible.  Feel free to call the clinic should you have any questions or concerns. The clinic phone number is [phone].  Please show the [facility] at check-in to the Emergency Department and triage nurse.

## 2019-10-09 NOTE — Progress Notes (Signed)
OK to treat with elevated LFTs per Dr. Lorenso Courier

## 2019-10-09 NOTE — Progress Notes (Signed)
Change premeds to compazine 10mg  po, since pt not getting [place] today.  OK per Dr Lorenso Courier

## 2019-10-10 ENCOUNTER — Telehealth: Payer: Self-pay | Admitting: Hematology and Oncology

## 2019-10-10 LAB — T4: T4, Total: 8.2 ug/dL (ref 4.5–12.0)

## 2019-10-10 NOTE — Telephone Encounter (Signed)
Scheduled per los. Called and left msg. Mailed printout  °

## 2019-10-12 ENCOUNTER — Inpatient Hospital Stay (HOSPITAL_COMMUNITY)
Admission: EM | Admit: 2019-10-12 | Discharge: 2019-10-28 | DRG: 871 | Disposition: A | Discharge status: Home / Self Care | Payer: PRIVATE HEALTH INSURANCE | Attending: Internal Medicine | Admitting: Internal Medicine

## 2019-10-12 ENCOUNTER — Other Ambulatory Visit: Payer: Self-pay

## 2019-10-12 ENCOUNTER — Emergency Department (HOSPITAL_COMMUNITY): Payer: PRIVATE HEALTH INSURANCE

## 2019-10-12 ENCOUNTER — Encounter (HOSPITAL_COMMUNITY): Payer: Self-pay | Admitting: Emergency Medicine

## 2019-10-12 ENCOUNTER — Encounter: Payer: Self-pay | Admitting: Hematology and Oncology

## 2019-10-12 DIAGNOSIS — F419 Anxiety disorder, unspecified: Secondary | ICD-10-CM | POA: Diagnosis present

## 2019-10-12 DIAGNOSIS — C3491 Malignant neoplasm of unspecified part of right bronchus or lung: Secondary | ICD-10-CM | POA: Diagnosis not present

## 2019-10-12 DIAGNOSIS — A0472 Enterocolitis due to Clostridium difficile, not specified as recurrent: Secondary | ICD-10-CM | POA: Diagnosis present

## 2019-10-12 DIAGNOSIS — C7931 Secondary malignant neoplasm of brain: Secondary | ICD-10-CM | POA: Diagnosis present

## 2019-10-12 DIAGNOSIS — F32A Depression, unspecified: Secondary | ICD-10-CM | POA: Diagnosis present

## 2019-10-12 DIAGNOSIS — K5903 Drug induced constipation: Secondary | ICD-10-CM | POA: Diagnosis not present

## 2019-10-12 DIAGNOSIS — C3490 Malignant neoplasm of unspecified part of unspecified bronchus or lung: Secondary | ICD-10-CM | POA: Diagnosis present

## 2019-10-12 DIAGNOSIS — R7989 Other specified abnormal findings of blood chemistry: Secondary | ICD-10-CM

## 2019-10-12 DIAGNOSIS — T402X5A Adverse effect of other opioids, initial encounter: Secondary | ICD-10-CM | POA: Diagnosis not present

## 2019-10-12 DIAGNOSIS — R9431 Abnormal electrocardiogram [ECG] [EKG]: Secondary | ICD-10-CM | POA: Diagnosis not present

## 2019-10-12 DIAGNOSIS — E669 Obesity, unspecified: Secondary | ICD-10-CM | POA: Diagnosis present

## 2019-10-12 DIAGNOSIS — A4189 Other specified sepsis: Principal | ICD-10-CM | POA: Diagnosis present

## 2019-10-12 DIAGNOSIS — R000 Tachycardia, unspecified: Secondary | ICD-10-CM | POA: Diagnosis present

## 2019-10-12 DIAGNOSIS — Z8249 Family history of ischemic heart disease and other diseases of the circulatory system: Secondary | ICD-10-CM

## 2019-10-12 DIAGNOSIS — K529 Noninfective gastroenteritis and colitis, unspecified: Secondary | ICD-10-CM | POA: Diagnosis present

## 2019-10-12 DIAGNOSIS — M7989 Other specified soft tissue disorders: Secondary | ICD-10-CM | POA: Diagnosis not present

## 2019-10-12 DIAGNOSIS — I10 Essential (primary) hypertension: Secondary | ICD-10-CM | POA: Diagnosis present

## 2019-10-12 DIAGNOSIS — C7949 Secondary malignant neoplasm of other parts of nervous system: Secondary | ICD-10-CM | POA: Diagnosis present

## 2019-10-12 DIAGNOSIS — Z91010 Allergy to peanuts: Secondary | ICD-10-CM

## 2019-10-12 DIAGNOSIS — R6520 Severe sepsis without septic shock: Secondary | ICD-10-CM | POA: Diagnosis present

## 2019-10-12 DIAGNOSIS — Z6833 Body mass index (BMI) 33.0-33.9, adult: Secondary | ICD-10-CM

## 2019-10-12 DIAGNOSIS — D61810 Antineoplastic chemotherapy induced pancytopenia: Secondary | ICD-10-CM | POA: Diagnosis present

## 2019-10-12 DIAGNOSIS — Z20822 Contact with and (suspected) exposure to covid-19: Secondary | ICD-10-CM | POA: Diagnosis present

## 2019-10-12 DIAGNOSIS — E876 Hypokalemia: Secondary | ICD-10-CM | POA: Diagnosis present

## 2019-10-12 DIAGNOSIS — G893 Neoplasm related pain (acute) (chronic): Secondary | ICD-10-CM | POA: Diagnosis present

## 2019-10-12 DIAGNOSIS — R1084 Generalized abdominal pain: Secondary | ICD-10-CM

## 2019-10-12 DIAGNOSIS — R6510 Systemic inflammatory response syndrome (SIRS) of non-infectious origin without acute organ dysfunction: Secondary | ICD-10-CM | POA: Insufficient documentation

## 2019-10-12 DIAGNOSIS — A419 Sepsis, unspecified organism: Secondary | ICD-10-CM | POA: Diagnosis present

## 2019-10-12 DIAGNOSIS — G253 Myoclonus: Secondary | ICD-10-CM | POA: Diagnosis present

## 2019-10-12 DIAGNOSIS — Z86718 Personal history of other venous thrombosis and embolism: Secondary | ICD-10-CM | POA: Diagnosis not present

## 2019-10-12 DIAGNOSIS — R079 Chest pain, unspecified: Secondary | ICD-10-CM

## 2019-10-12 DIAGNOSIS — Z833 Family history of diabetes mellitus: Secondary | ICD-10-CM

## 2019-10-12 DIAGNOSIS — Z7901 Long term (current) use of anticoagulants: Secondary | ICD-10-CM

## 2019-10-12 DIAGNOSIS — R0781 Pleurodynia: Secondary | ICD-10-CM

## 2019-10-12 DIAGNOSIS — Z7189 Other specified counseling: Secondary | ICD-10-CM | POA: Diagnosis not present

## 2019-10-12 DIAGNOSIS — C787 Secondary malignant neoplasm of liver and intrahepatic bile duct: Secondary | ICD-10-CM | POA: Diagnosis present

## 2019-10-12 DIAGNOSIS — T451X5A Adverse effect of antineoplastic and immunosuppressive drugs, initial encounter: Secondary | ICD-10-CM | POA: Diagnosis present

## 2019-10-12 DIAGNOSIS — C7951 Secondary malignant neoplasm of bone: Secondary | ICD-10-CM | POA: Diagnosis present

## 2019-10-12 DIAGNOSIS — Z91018 Allergy to other foods: Secondary | ICD-10-CM

## 2019-10-12 DIAGNOSIS — E860 Dehydration: Secondary | ICD-10-CM | POA: Diagnosis present

## 2019-10-12 DIAGNOSIS — Z86711 Personal history of pulmonary embolism: Secondary | ICD-10-CM

## 2019-10-12 DIAGNOSIS — Z515 Encounter for palliative care: Secondary | ICD-10-CM | POA: Diagnosis not present

## 2019-10-12 DIAGNOSIS — K5900 Constipation, unspecified: Secondary | ICD-10-CM

## 2019-10-12 LAB — COMPREHENSIVE METABOLIC PANEL
ALT: 118 U/L — ABNORMAL HIGH (ref 0–44)
AST: 120 U/L — ABNORMAL HIGH (ref 15–41)
Albumin: 2.7 g/dL — ABNORMAL LOW (ref 3.5–5.0)
Alkaline Phosphatase: 401 U/L — ABNORMAL HIGH (ref 38–126)
Anion gap: 13 (ref 5–15)
BUN: 9 mg/dL (ref 6–20)
CO2: 20 mmol/L — ABNORMAL LOW (ref 22–32)
Calcium: 7.1 mg/dL — ABNORMAL LOW (ref 8.9–10.3)
Chloride: 107 mmol/L (ref 98–111)
Creatinine, Ser: 1.16 mg/dL (ref 0.61–1.24)
GFR calc Af Amer: >60 mL/min (ref >60)
GFR calc non Af Amer: >60 mL/min (ref >60)
Glucose, Bld: 170 mg/dL — ABNORMAL HIGH (ref 70–99)
Potassium: 3.2 mmol/L — ABNORMAL LOW (ref 3.5–5.1)
Sodium: 140 mmol/L (ref 135–145)
Total Bilirubin: 1.5 mg/dL — ABNORMAL HIGH (ref 0.3–1.2)
Total Protein: 5.8 g/dL — ABNORMAL LOW (ref 6.5–8.1)

## 2019-10-12 LAB — CBC
HCT: 27.8 % — ABNORMAL LOW (ref 39.0–52.0)
Hemoglobin: 8.1 g/dL — ABNORMAL LOW (ref 13.0–17.0)
MCH: 29.2 pg (ref 26.0–34.0)
MCHC: 29.1 g/dL — ABNORMAL LOW (ref 30.0–36.0)
MCV: 100.4 fL — ABNORMAL HIGH (ref 80.0–100.0)
Platelets: 124 10*3/uL — ABNORMAL LOW (ref 150–400)
RBC: 2.77 MIL/uL — ABNORMAL LOW (ref 4.22–5.81)
RDW: 16.5 % — ABNORMAL HIGH (ref 11.5–15.5)
WBC: 6.1 10*3/uL (ref 4.0–10.5)
nRBC: 0.3 % — ABNORMAL HIGH (ref 0.0–0.2)

## 2019-10-12 LAB — SARS CORONAVIRUS 2 BY RT PCR (HOSPITAL ORDER, PERFORMED IN ~~LOC~~ HOSPITAL LAB): SARS Coronavirus 2: NEGATIVE

## 2019-10-12 LAB — PROTIME-INR
INR: 6.9 (ref 0.8–1.2)
Prothrombin Time: 57.8 seconds — ABNORMAL HIGH (ref 11.4–15.2)

## 2019-10-12 LAB — LACTIC ACID, PLASMA
Lactic Acid, Venous: 4 mmol/L (ref 0.5–1.9)
Lactic Acid, Venous: 2.1 mmol/L (ref 0.5–1.9)

## 2019-10-12 LAB — TROPONIN I (HIGH SENSITIVITY)
Troponin I (High Sensitivity): 7 ng/L (ref <18)
Troponin I (High Sensitivity): 8 ng/L (ref <18)

## 2019-10-12 LAB — LIPASE, BLOOD: Lipase: 19 U/L (ref 11–51)

## 2019-10-12 IMAGING — CR DG CHEST 2V
2 series · 2 of 2 positions shown · non-contrast
Comparison: [DATE]

CLINICAL DATA: Chest pain, shortness of breath

EXAM:
CHEST - 2 VIEW

[w chest lat]
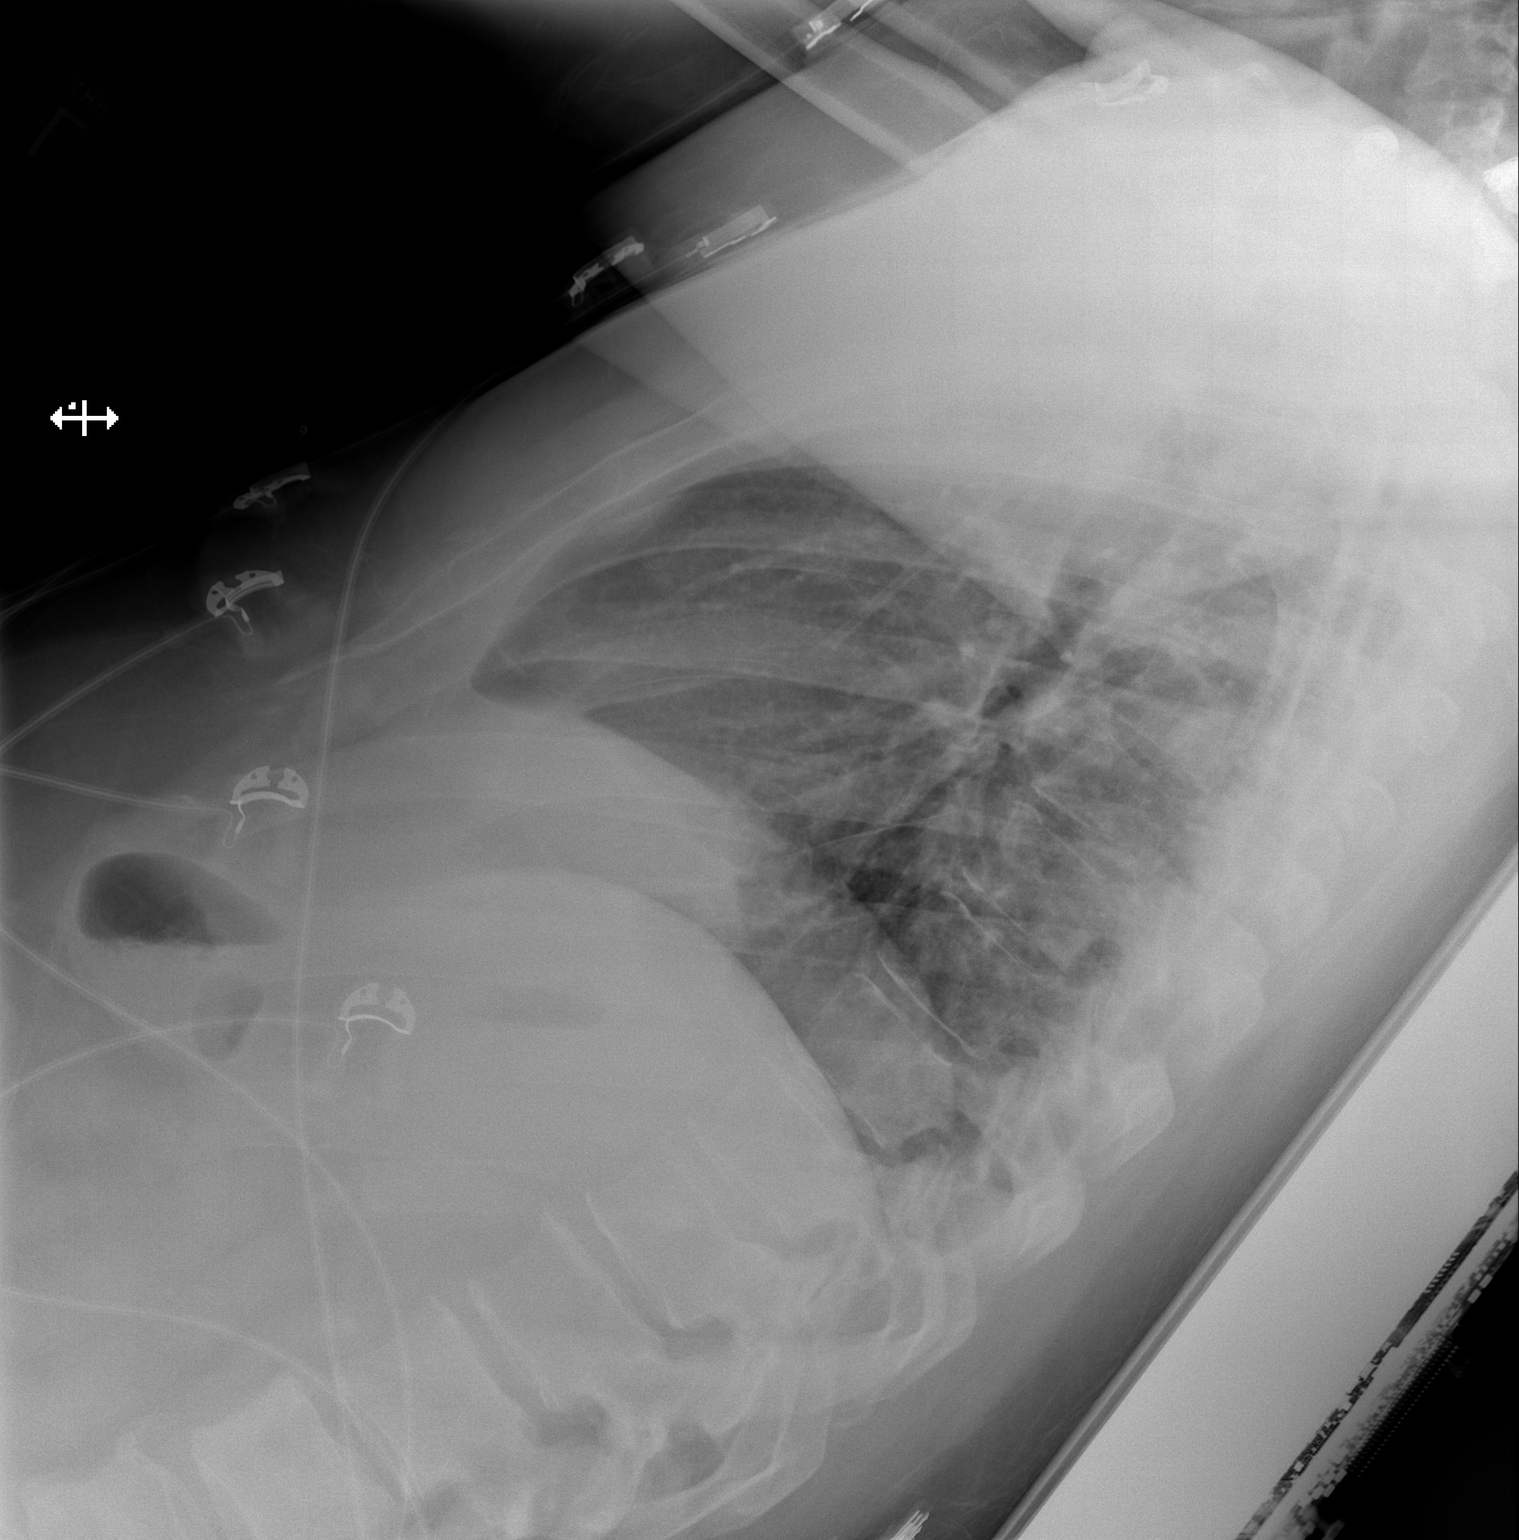

[x chest ap]
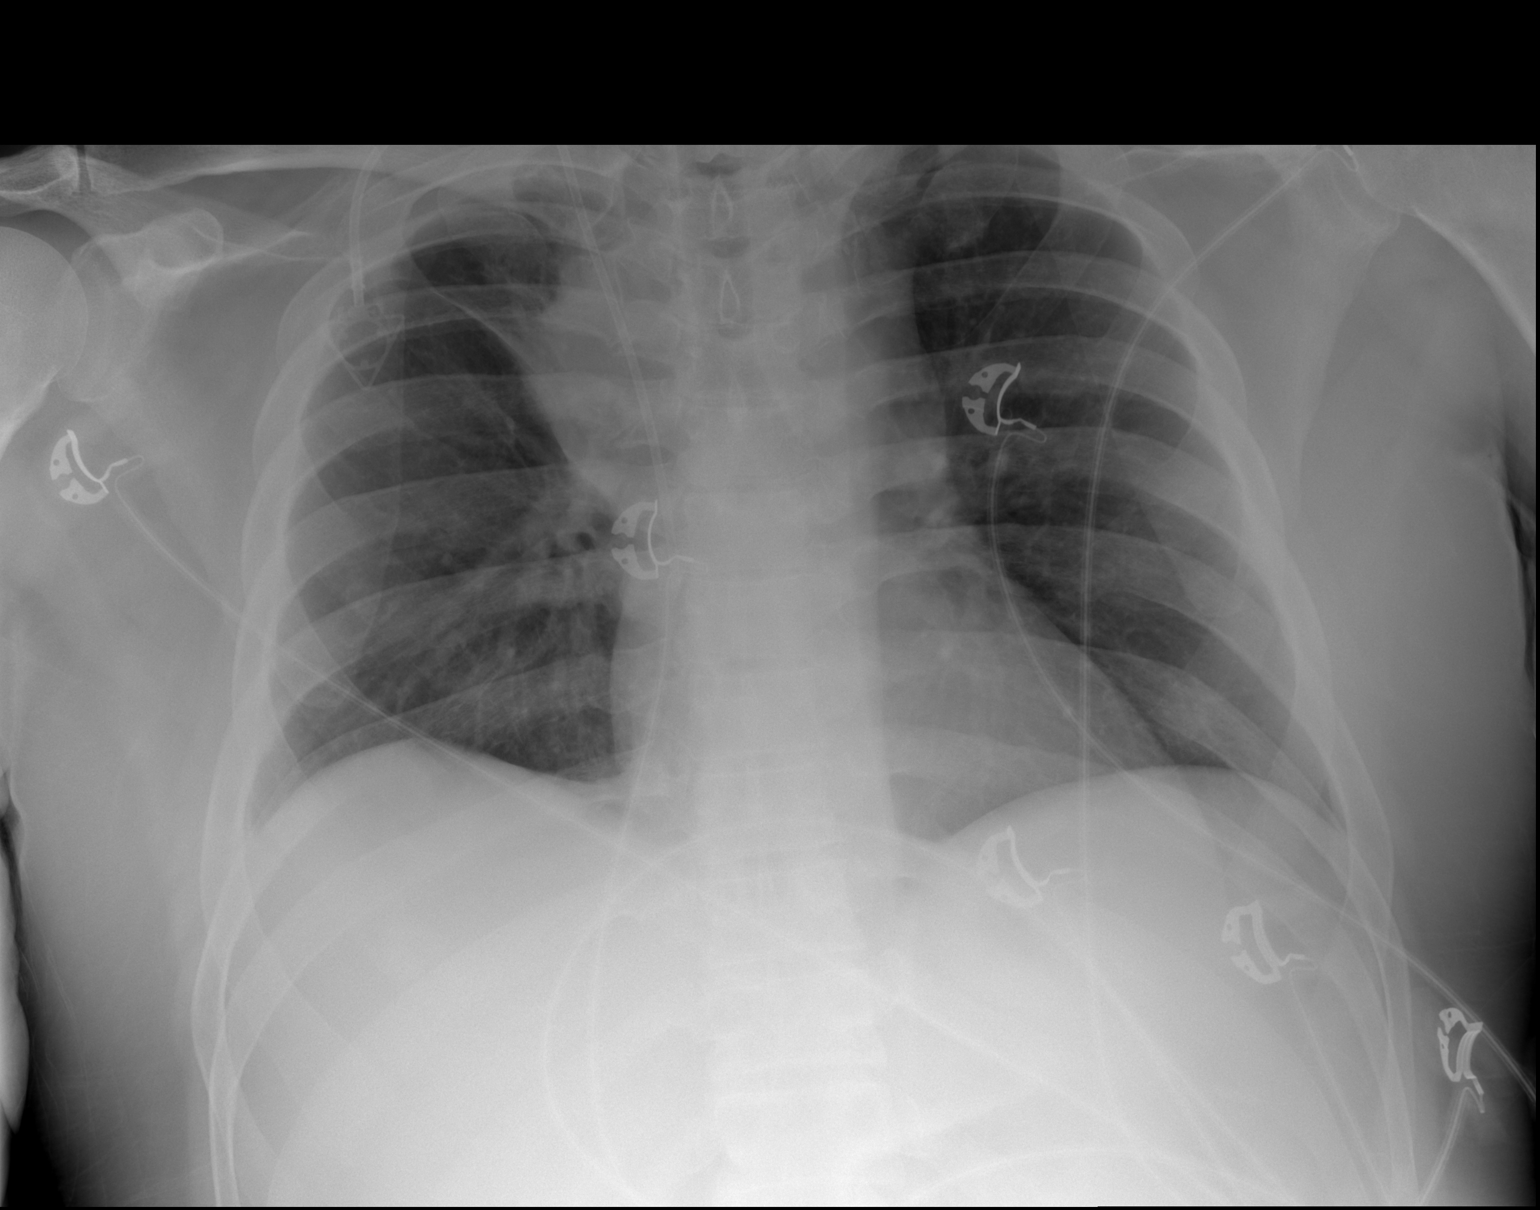

[2 of 2 positions shown; findings below may reference images not displayed]

FINDINGS: Right medial upper lobe mass/opacity again noted. Left lung clear.
Heart is normal size. No effusions. Right Port-A-Cath in place,
unchanged.
IMPRESSION: Right upper lobe medial masslike opacity again noted.

## 2019-10-12 IMAGING — CT CT ANGIO CHEST
3 of 7 series · 18 of 36 positions shown · IV contrast (omnipaque)
Comparison: CT chest abdomen pelvis, [DATE]

CLINICAL DATA: PE suspected, metastatic lung cancer

EXAM:
CT ANGIOGRAPHY CHEST WITH CONTRAST
TECHNIQUE: Multidetector CT imaging of the chest was performed using the
standard protocol during bolus administration of intravenous
contrast. Multiplanar CT image reconstructions and MIPs were
obtained to evaluate the vascular anatomy.
CONTRAST:  100mL OMNIPAQUE IOHEXOL 350 MG/ML SOLN

[Series 8: thins · axial · 0.87mm/px · z∈[-526,-282]mm · 13 of 286 slices shown]
[im 21/286  lung]
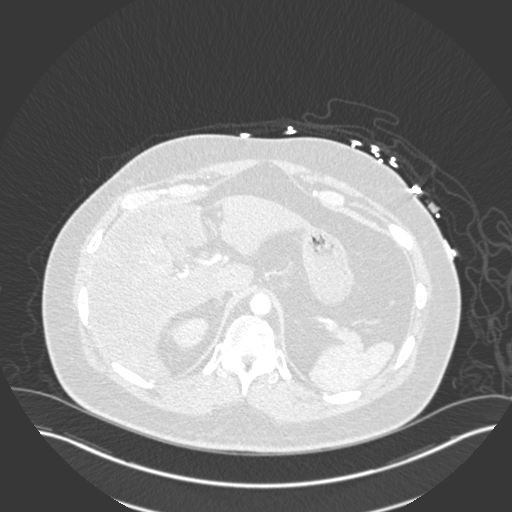
[im 41/286  mediastinal]
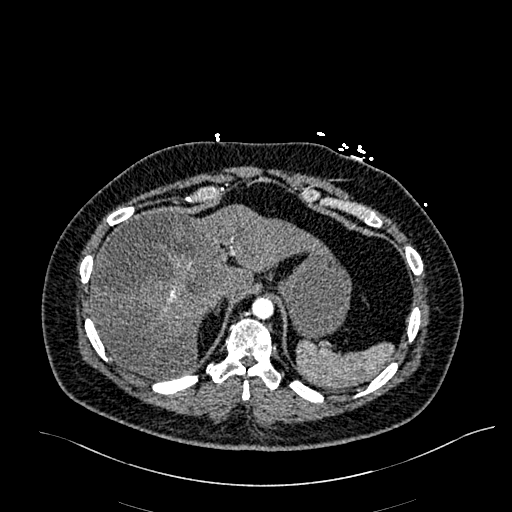
[im 62/286  lung]
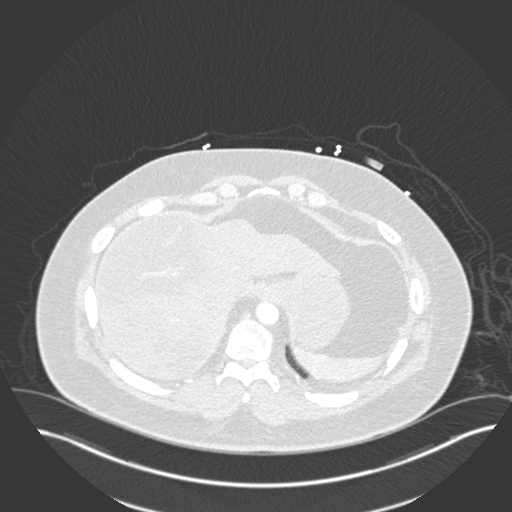
[im 82/286  mediastinal]
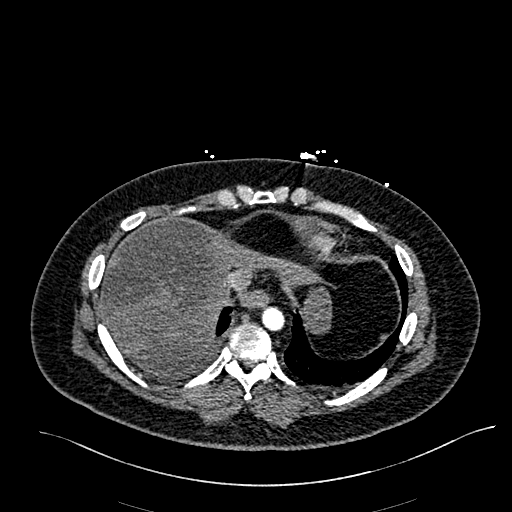
[im 102/286  lung]
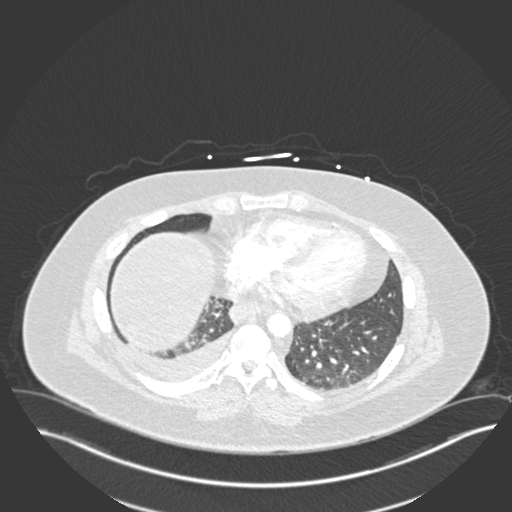
[im 123/286  mediastinal]
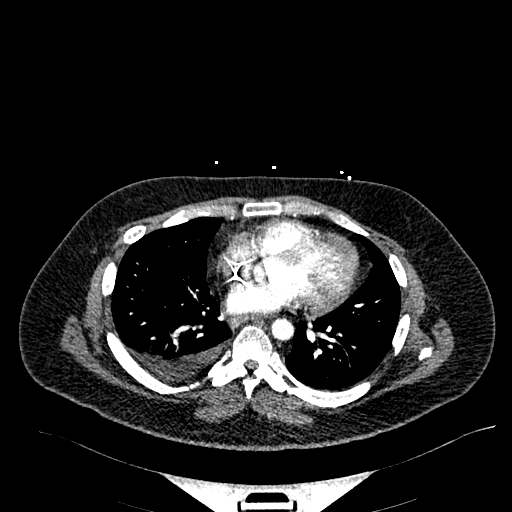
[im 143/286  lung]
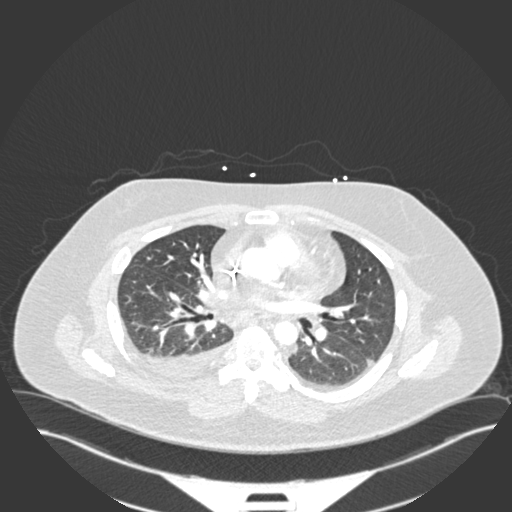
[im 163/286  mediastinal]
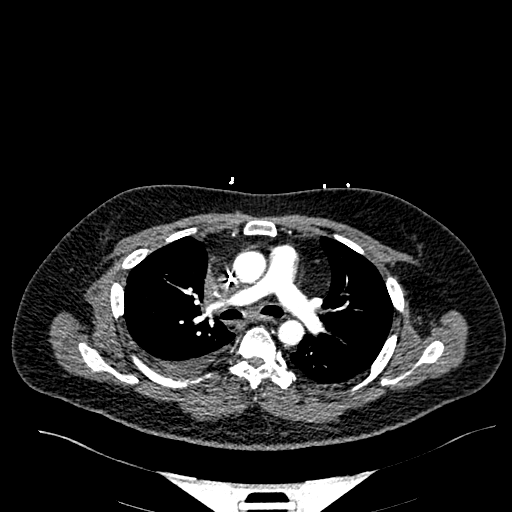
[im 184/286  lung]
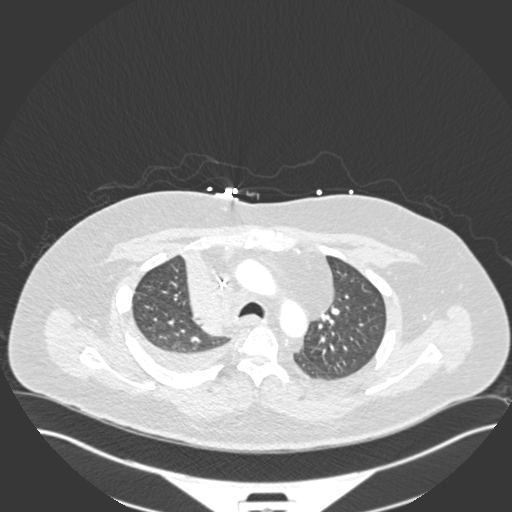
[im 204/286  mediastinal]
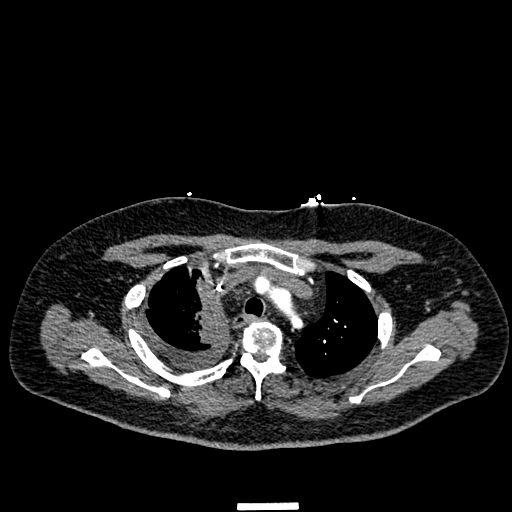
[im 224/286  lung]
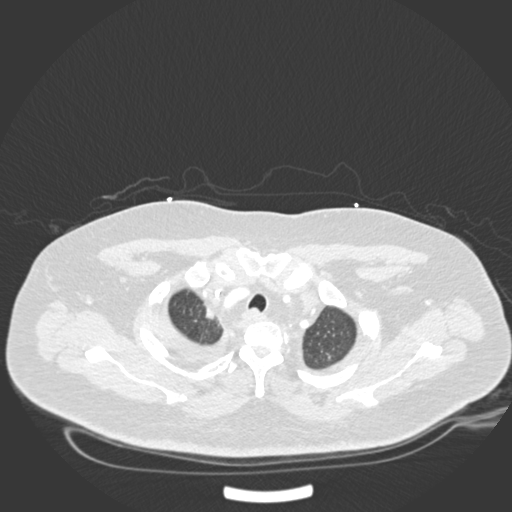
[im 245/286  mediastinal]
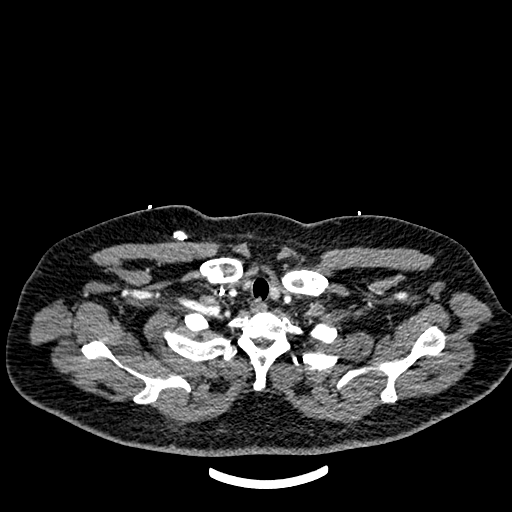
[im 265/286  lung]
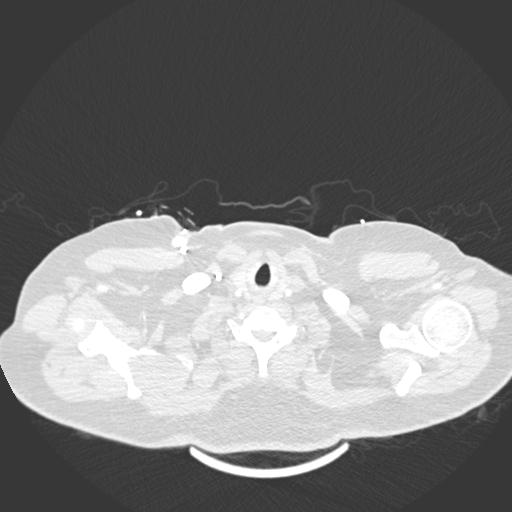

[Series 9: lung · axial · 0.72mm/px · z∈[-492,-346]mm · 4 of 123 slices shown]
[im 25/123  mediastinal]
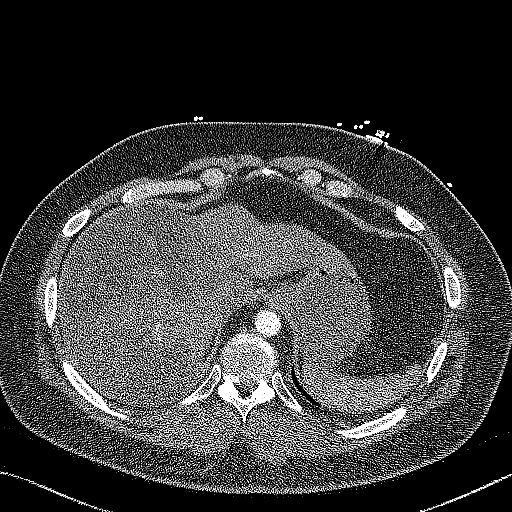
[im 49/123  mediastinal]
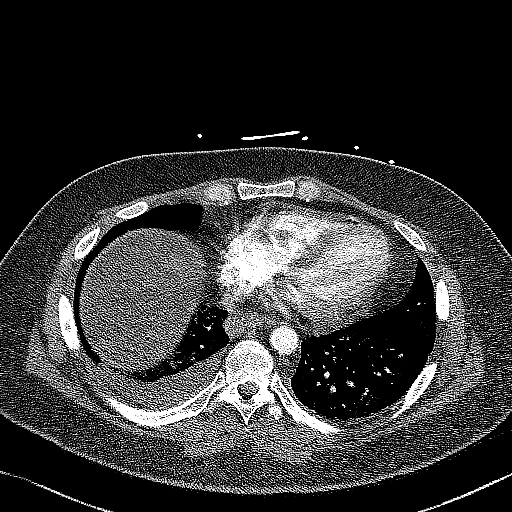
[im 74/123  mediastinal]
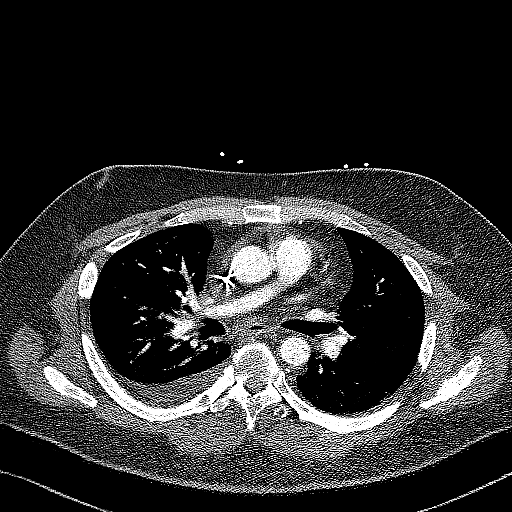
[im 98/123  mediastinal]
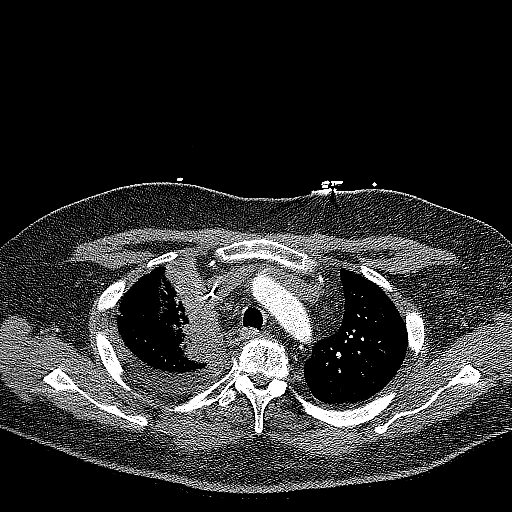

[Series 10: coronal mpr · coronal · 0.61mm/px · 1 of 166 slices shown]
[im 83/166  mediastinal]
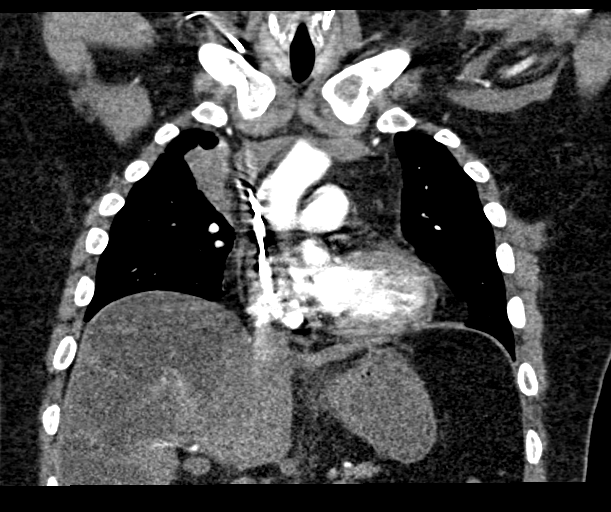

[18 of 36 positions shown; findings below may reference images not displayed]

FINDINGS: Cardiovascular: Satisfactory opacification of the pulmonary arteries
to the segmental level. No evidence of pulmonary embolism. Normal
heart size. No pericardial effusion. Right chest port catheter

Mediastinum/Nodes: Unchanged enlarged pretracheal and right hilar
lymph nodes. Thyroid gland, trachea, and esophagus demonstrate no
significant findings.

Lungs/Pleura: New small right pleural effusion and associated
atelectasis or consolidation. Unchanged post treatment appearance of
a right upper lobe mass and surrounding paramedian consolidation

Upper Abdomen: No acute abnormality.

Musculoskeletal: No chest wall abnormality. Unchanged, extensive
sclerotic osseous metastatic disease, notable for a pathologic
fracture of the T3 vertebral body.

Review of the MIP images confirms the above findings.
IMPRESSION: 1. Negative examination for pulmonary embolism.
2. New small right pleural effusion and associated atelectasis or
consolidation.
3. Unchanged post treatment appearance of a right upper lobe mass
and surrounding paramedian consolidation when compared to recent
staging CT.
4. Unchanged enlarged mediastinal lymph nodes and extensive
sclerotic osseous metastatic disease.

## 2019-10-12 IMAGING — CT CT ABD-PELV W/ CM
2 of 4 series · 15 of 46 positions shown, 17 images · IV contrast (OMNIPAQUE 300)
Comparison: [DATE]

CLINICAL DATA: Abdominal pain

EXAM:
CT ABDOMEN AND PELVIS WITH CONTRAST
TECHNIQUE: Multidetector CT imaging of the abdomen and pelvis was performed
using the standard protocol following bolus administration of
intravenous contrast.
CONTRAST:  100mL OMNIPAQUE IOHEXOL 350 MG/ML SOLN

[Series 3: axial st · axial · 0.98mm/px · z∈[-939,-449]mm · 12 of 112 slices shown, 14 images]
[im 7/112  soft-tissue]
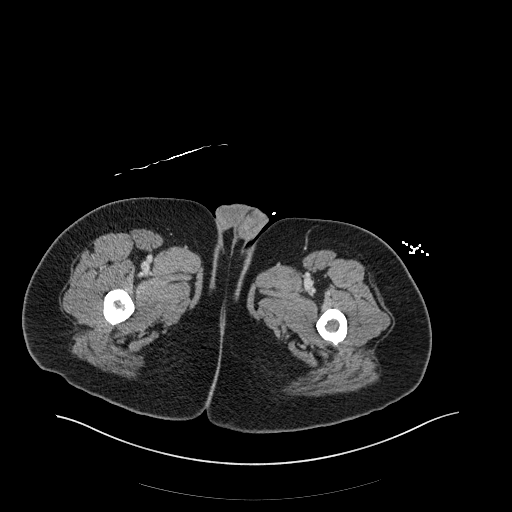
[im 7/112  bone]
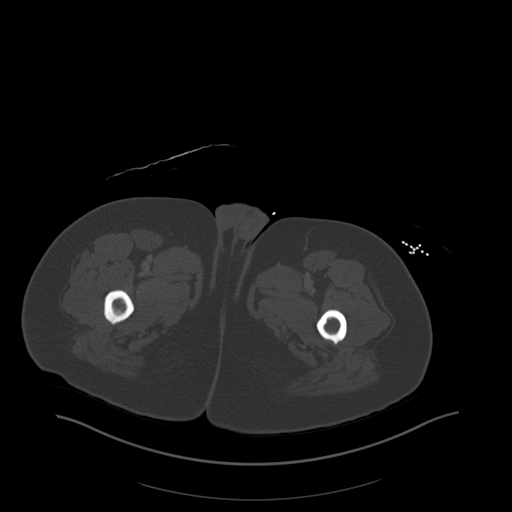
[im 20/112  soft-tissue]
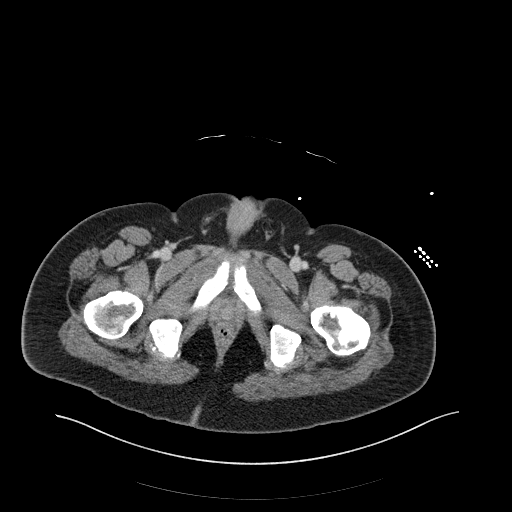
[im 27/112  soft-tissue]
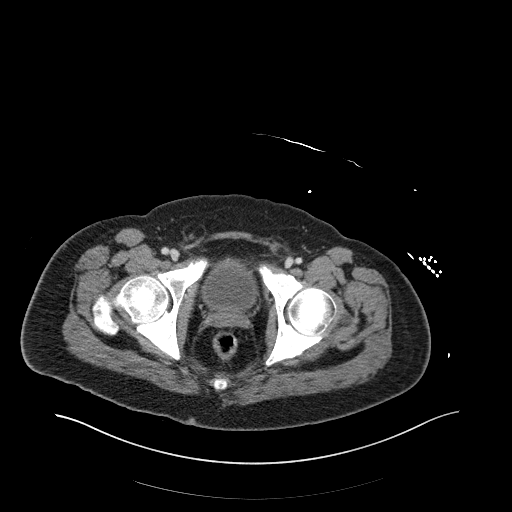
[im 33/112  soft-tissue]
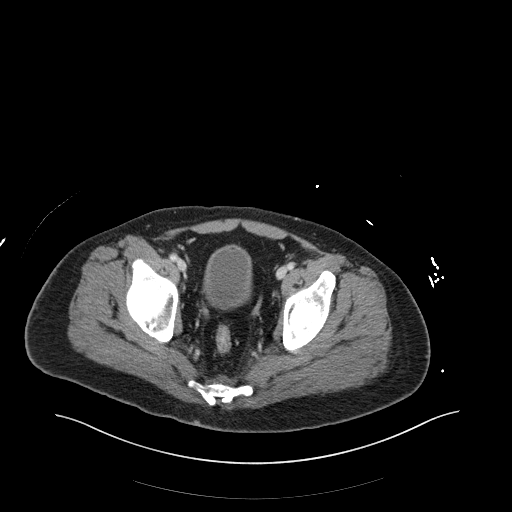
[im 46/112  soft-tissue]
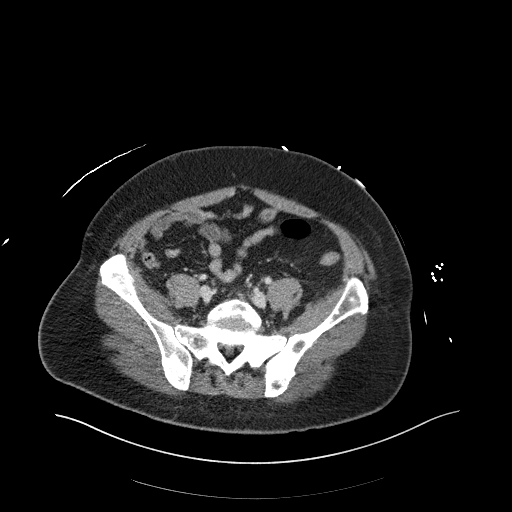
[im 53/112  soft-tissue]
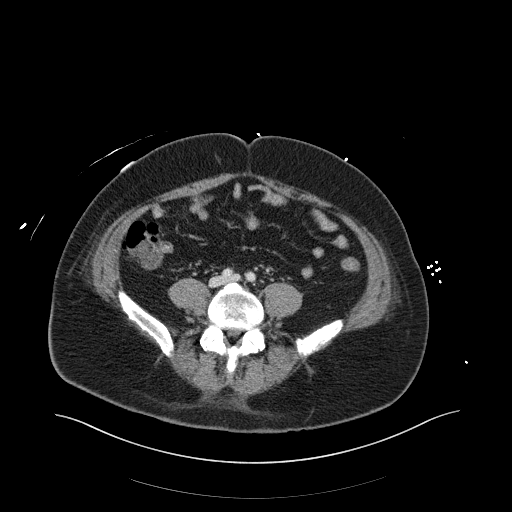
[im 59/112  soft-tissue]
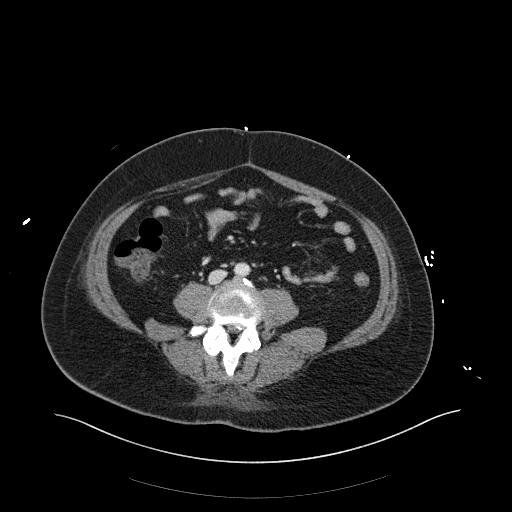
[im 72/112  soft-tissue]
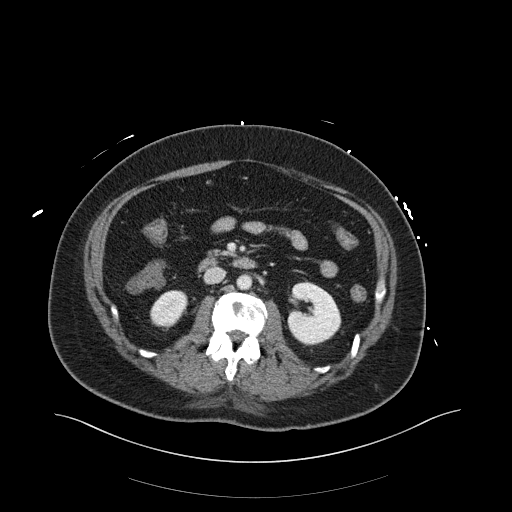
[im 79/112  soft-tissue]
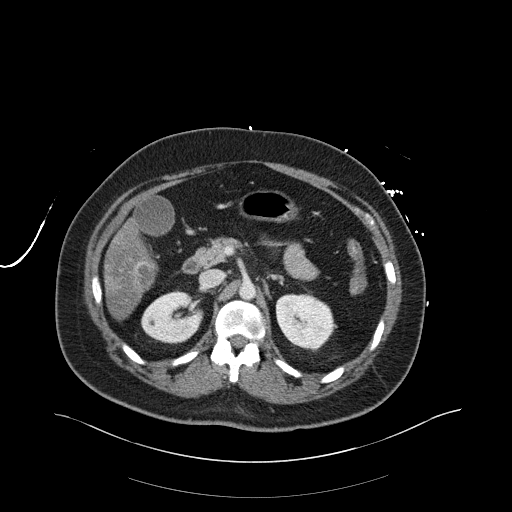
[im 79/112  bone]
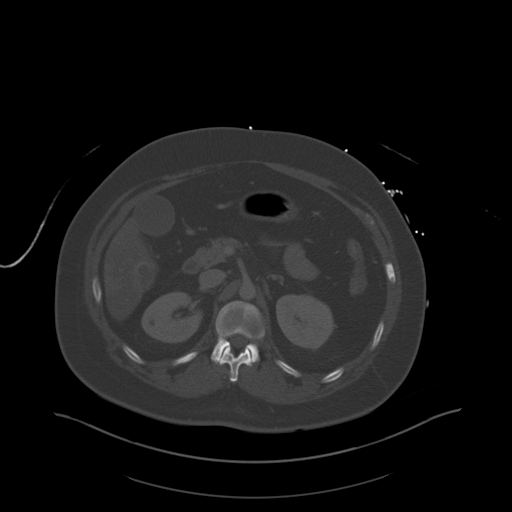
[im 85/112  soft-tissue]
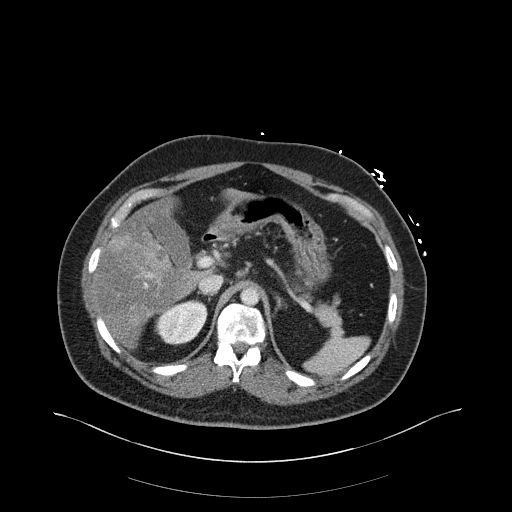
[im 98/112  soft-tissue]
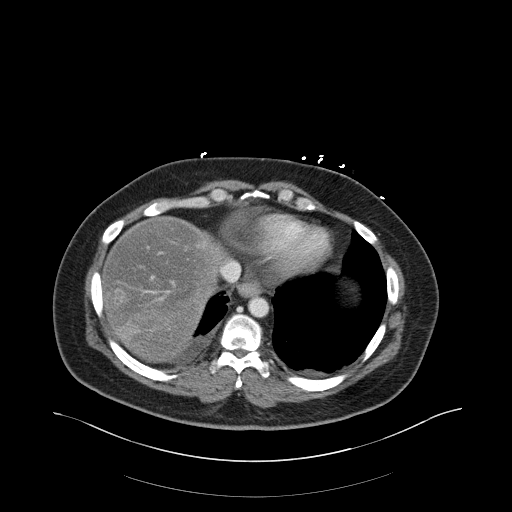
[im 105/112  soft-tissue]
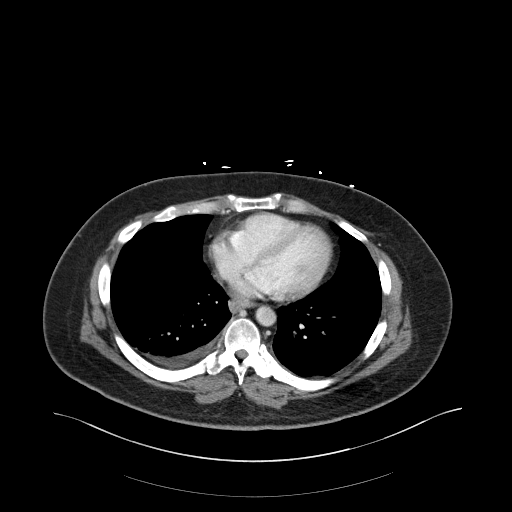

[Series 6: coronal st · coronal · 0.81mm/px · 3 of 171 slices shown]
[im 57/171  soft-tissue]
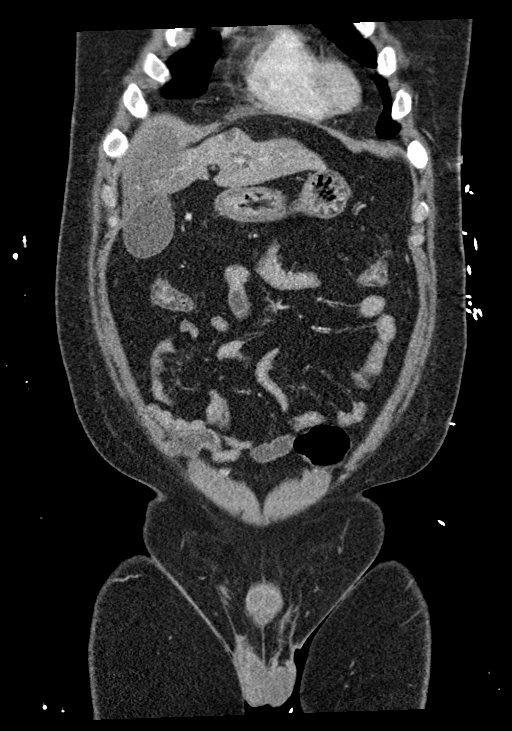
[im 76/171  soft-tissue]
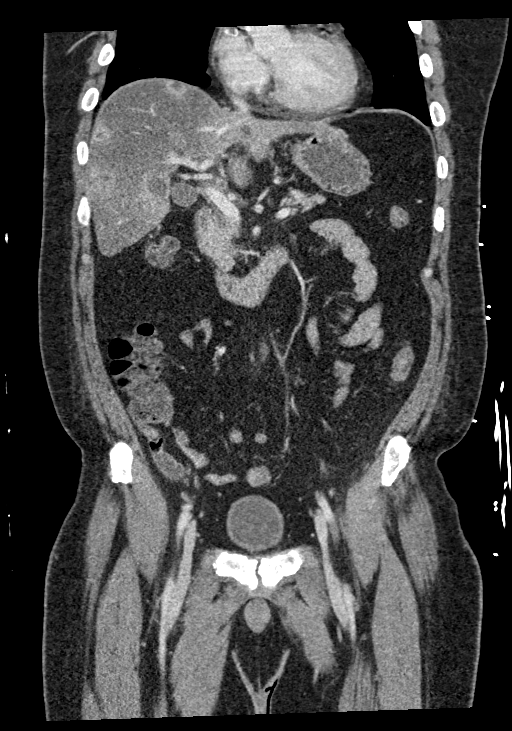
[im 95/171  soft-tissue]
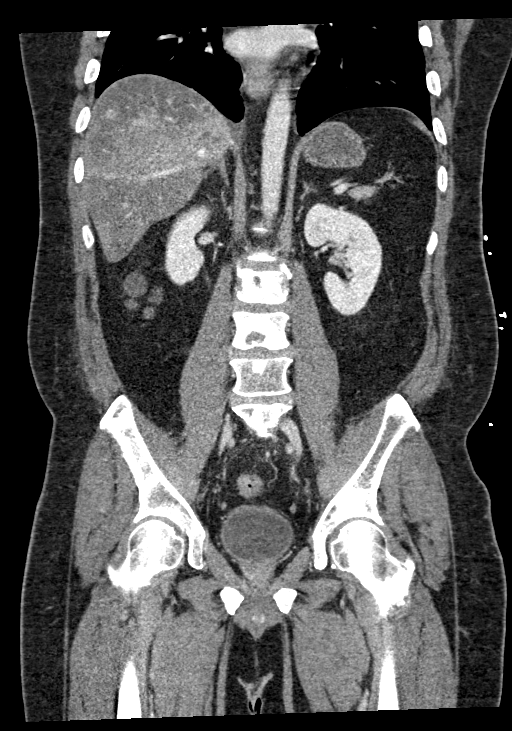

[15 of 46 positions shown; findings below may reference images not displayed]

FINDINGS: Lower chest: Please see separately dictated CT angiogram of the
chest.

Hepatobiliary: Hepatic steatosis. Numerous hypodense, rim enhancing
lesions throughout the liver parenchyma, not significantly changed
compared to recent prior staging CT. No gallstones, gallbladder wall
thickening, or biliary dilatation.

Pancreas: Unremarkable. No pancreatic ductal dilatation or
surrounding inflammatory changes.

Spleen: Normal in size without significant abnormality.

Adrenals/Urinary Tract: Adrenal glands are unremarkable. Kidneys are
normal, without renal calculi, solid lesion, or hydronephrosis.
Bladder is unremarkable.

Stomach/Bowel: Stomach is within normal limits. Appendix appears
normal. The colon is generally decompressed, however there is some
suggestion of inflammatory wall thickening, particularly in the
transverse colon (series 3, image 46).

Vascular/Lymphatic: No significant vascular findings are present.
Unchanged enlarged celiac axis lymph node measuring 1.0 x 0.8 cm
(series 3, image 26).

Reproductive: No mass or other significant abnormality.

Other: No abdominal wall hernia or abnormality. No abdominopelvic
ascites.

Musculoskeletal: Numerous sclerotic osseous lesions, not
significantly changed compared to prior examination.
IMPRESSION: 1. The colon is generally decompressed, however there is some
suggestion of inflammatory wall thickening, particularly in the
transverse colon. Findings are suggestive of nonspecific infectious
or inflammatory colitis.
2. No significant change in hepatic and osseous metastatic disease
compared to recent staging CT.
3. Hepatic steatosis.
4. Please see separately dictated CT examination of the chest.

## 2019-10-12 IMAGING — CT CT HEAD W/O CM
3 of 4 series · 15 of 47 positions shown, 18 images · non-contrast
Comparison: [DATE] MRI [DATE]

CLINICAL DATA: Headache

EXAM:
CT HEAD WITHOUT CONTRAST
TECHNIQUE: Contiguous axial images were obtained from the base of the skull
through the vertex without intravenous contrast.

[Series 3: head wo · axial · 0.46mm/px · z∈[-154,-14]mm · 9 of 34 slices shown, 12 images]
[im 3/34  brain]
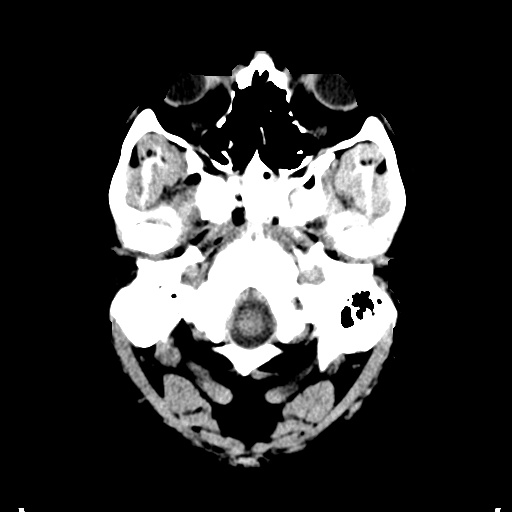
[im 3/34  bone]
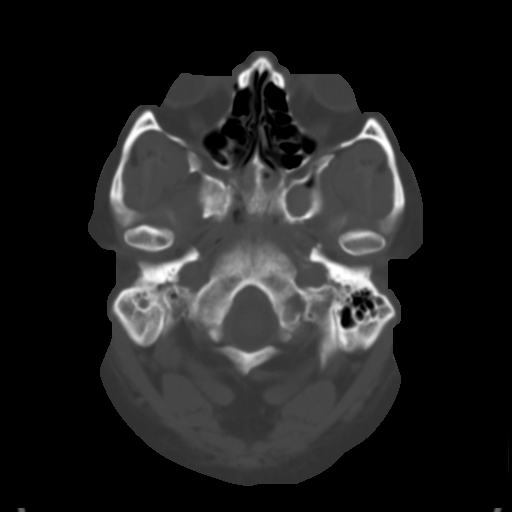
[im 8/34  brain]
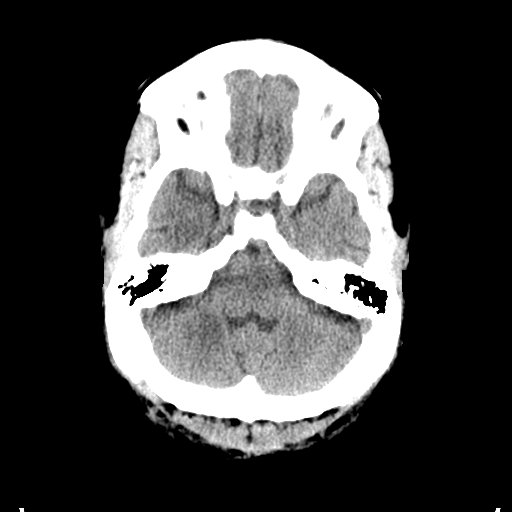
[im 10/34  brain]
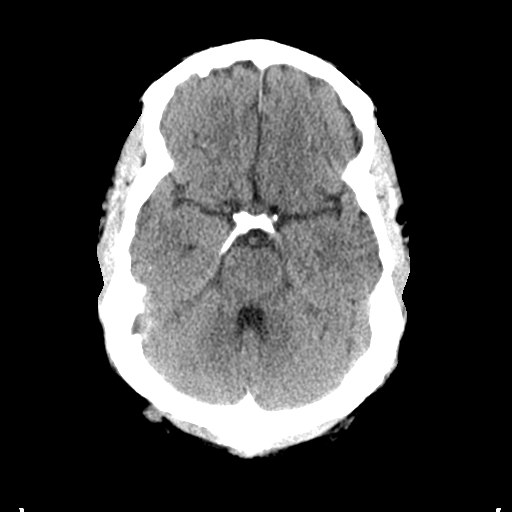
[im 15/34  brain]
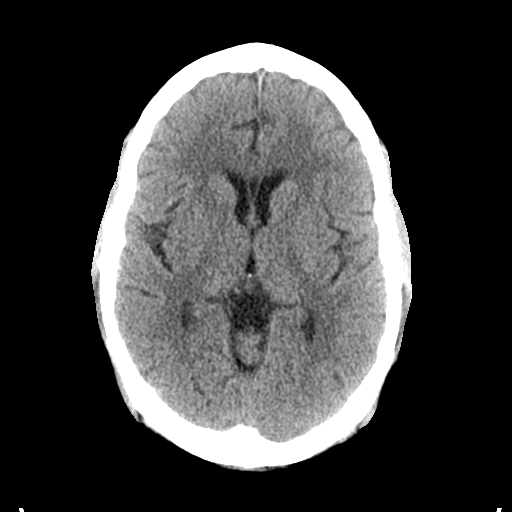
[im 17/34  brain]
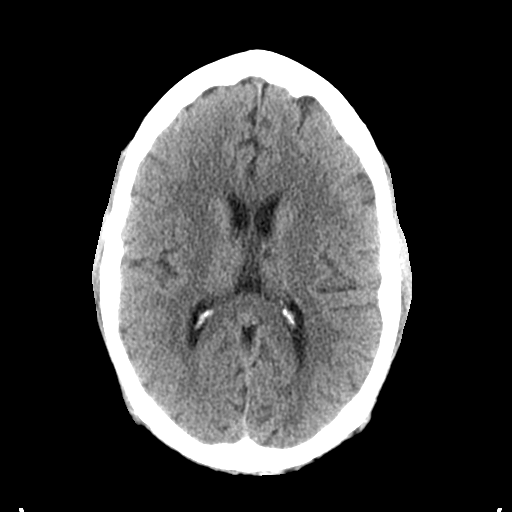
[im 17/34  bone]
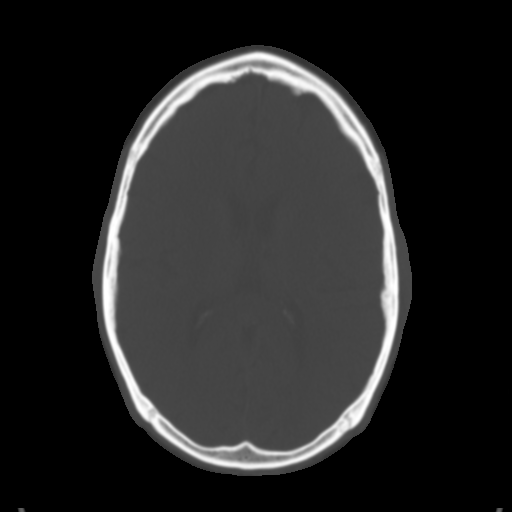
[im 19/34  brain]
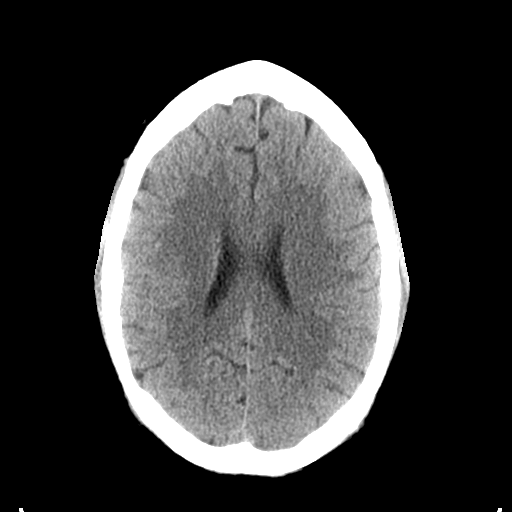
[im 24/34  brain]
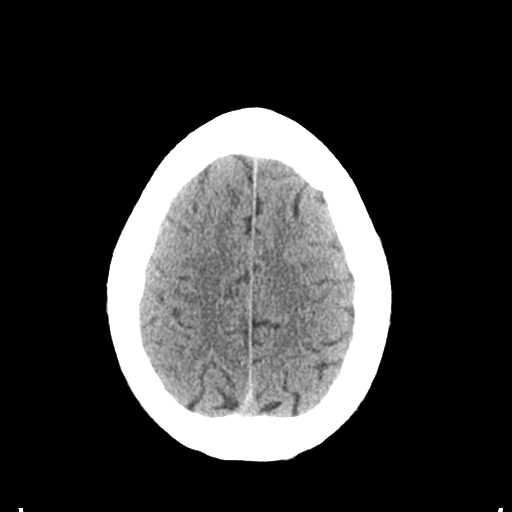
[im 26/34  brain]
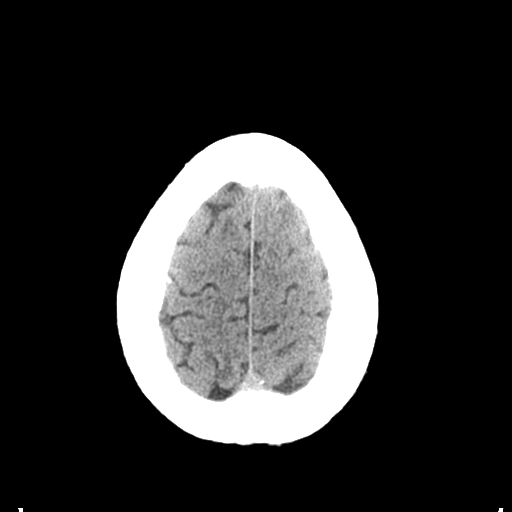
[im 31/34  brain]
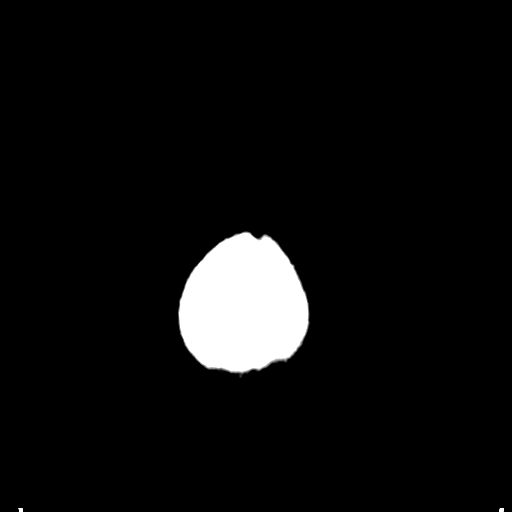
[im 31/34  bone]
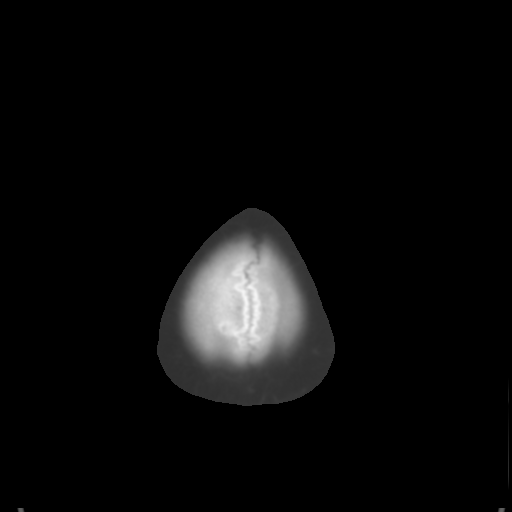

[Series 6: coronal soft tissue · coronal · 0.33mm/px · 3 of 79 slices shown]
[im 27/79  brain]
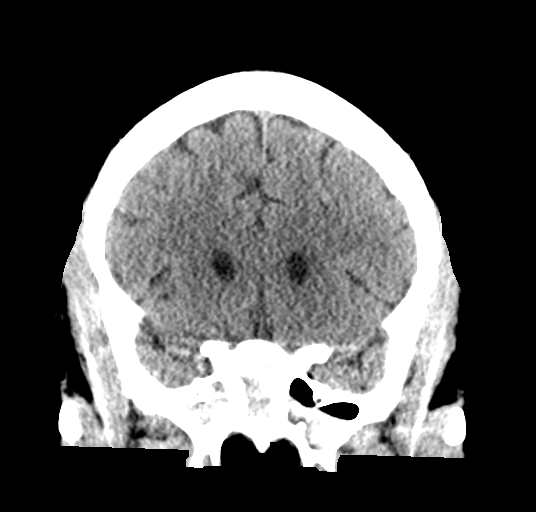
[im 35/79  brain]
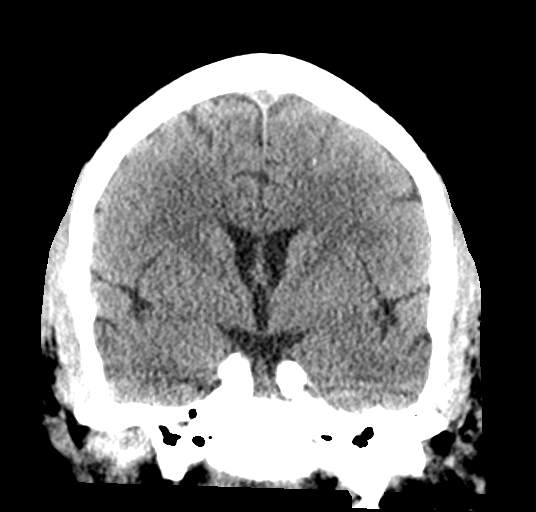
[im 44/79  brain]
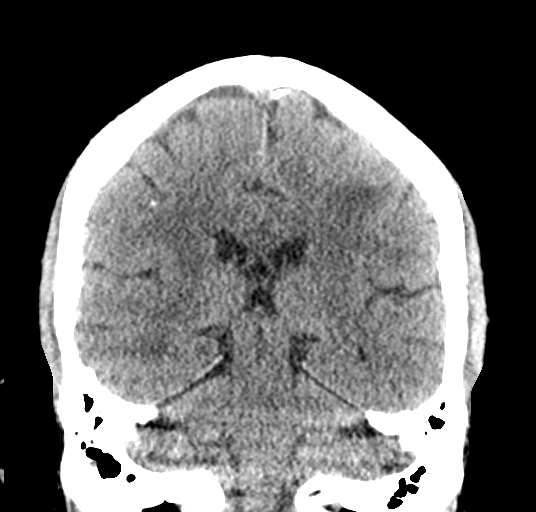

[Series 7: sagittal soft tissue · sagittal · 0.33mm/px · 3 of 79 slices shown]
[im 27/79  brain]
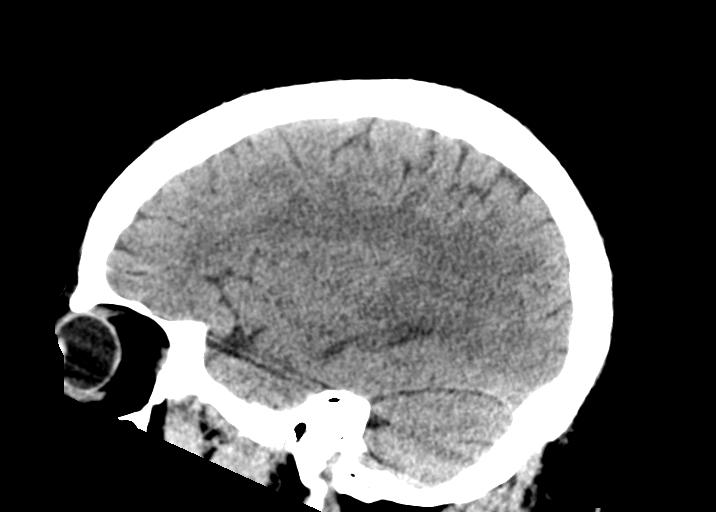
[im 40/79  brain]
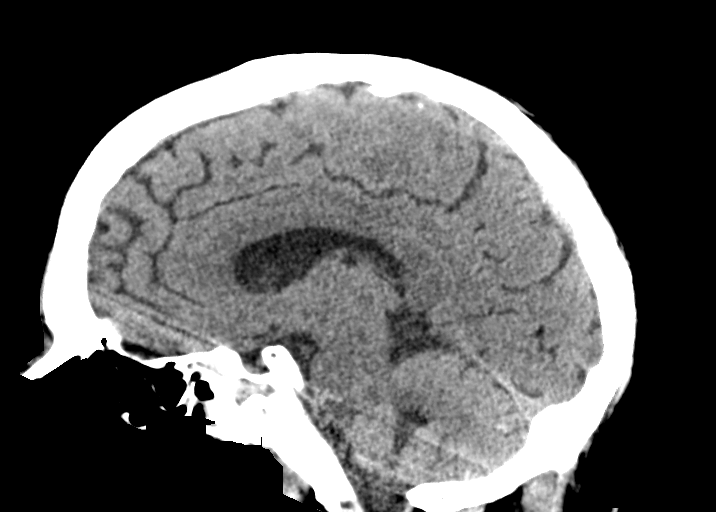
[im 53/79  brain]
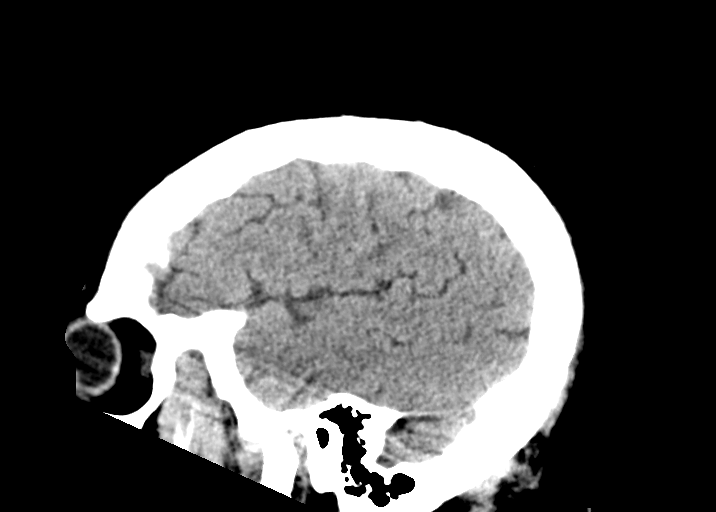

[15 of 47 positions shown; findings below may reference images not displayed]

FINDINGS: Brain: Area of low-density seen in the left frontal lobe subcortical
white matter. No hemorrhage or hydrocephalus.

Vascular: No hyperdense vessel or unexpected calcification.

Skull: No acute calvarial abnormality.

Sinuses/Orbits: Mucosal thickening in the sphenoid sinuses. No
change.

Other: None
IMPRESSION: Area of subcortical low-density in the left frontal lobe likely
related to edema surrounding 1 of the many brain lesions seen on
prior MRI.

No hemorrhage.

## 2019-10-12 MED ORDER — RIVAROXABAN 20 MG PO TABS
20.0000 mg | ORAL_TABLET | Freq: Every day | ORAL | Status: DC
  Administered 2019-10-13 – 2019-10-15 (×4): 20 mg via ORAL
  Filled 2019-10-12 (×5): qty 1

## 2019-10-12 MED ORDER — LACTATED RINGERS IV SOLN: INTRAVENOUS | Status: AC

## 2019-10-12 MED ORDER — SODIUM CHLORIDE 0.9% FLUSH
3.0000 mL | Freq: Two times a day (BID) | INTRAVENOUS | Status: DC
  Administered 2019-10-12 – 2019-10-20 (×8): 3 mL via INTRAVENOUS

## 2019-10-12 MED ORDER — LABETALOL HCL 5 MG/ML IV SOLN
10.0000 mg | INTRAVENOUS | Status: DC | PRN
  Administered 2019-10-12 – 2019-10-14 (×2): 10 mg via INTRAVENOUS
  Filled 2019-10-12 (×2): qty 4

## 2019-10-12 MED ORDER — ONDANSETRON HCL 4 MG PO TABS
4.0000 mg | ORAL_TABLET | Freq: Three times a day (TID) | ORAL | Status: DC | PRN
  Administered 2019-10-13 – 2019-10-26 (×7): 4 mg via ORAL
  Filled 2019-10-12 (×7): qty 1

## 2019-10-12 MED ORDER — FENTANYL 75 MCG/HR TD PT72
1.0000 | MEDICATED_PATCH | TRANSDERMAL | Status: DC
  Administered 2019-10-13 – 2019-10-16 (×2): 1 via TRANSDERMAL
  Filled 2019-10-12: qty 1

## 2019-10-12 MED ORDER — POTASSIUM CHLORIDE CRYS ER 20 MEQ PO TBCR
20.0000 meq | EXTENDED_RELEASE_TABLET | Freq: Once | ORAL | Status: AC
  Administered 2019-10-12: 20 meq via ORAL
  Filled 2019-10-12: qty 1

## 2019-10-12 MED ORDER — PIPERACILLIN-TAZOBACTAM 3.375 G IVPB
3.3750 g | Freq: Three times a day (TID) | INTRAVENOUS | Status: DC
  Administered 2019-10-13 (×4): 3.375 g via INTRAVENOUS
  Filled 2019-10-12 (×4): qty 50

## 2019-10-12 MED ORDER — ACETAMINOPHEN 325 MG PO TABS
650.0000 mg | ORAL_TABLET | Freq: Four times a day (QID) | ORAL | Status: DC | PRN
  Administered 2019-10-13 – 2019-10-15 (×3): 650 mg via ORAL
  Filled 2019-10-12 (×3): qty 2

## 2019-10-12 MED ORDER — HYDROMORPHONE HCL 2 MG/ML IJ SOLN
3.0000 mg | Freq: Once | INTRAMUSCULAR | Status: AC
  Administered 2019-10-12: 3 mg via INTRAVENOUS
  Filled 2019-10-12: qty 2

## 2019-10-12 MED ORDER — LACTATED RINGERS IV BOLUS (SEPSIS)
1000.0000 mL | Freq: Once | INTRAVENOUS | Status: AC
  Administered 2019-10-12: 1000 mL via INTRAVENOUS

## 2019-10-12 MED ORDER — HYDROMORPHONE HCL 2 MG PO TABS
4.0000 mg | ORAL_TABLET | Freq: Two times a day (BID) | ORAL | Status: DC | PRN
  Administered 2019-10-13: 8 mg via ORAL
  Administered 2019-10-13 – 2019-10-14 (×2): 4 mg via ORAL
  Filled 2019-10-12: qty 2
  Filled 2019-10-12: qty 3
  Filled 2019-10-12: qty 4
  Filled 2019-10-12: qty 2

## 2019-10-12 MED ORDER — HYDROMORPHONE HCL 2 MG/ML IJ SOLN
2.0000 mg | Freq: Once | INTRAMUSCULAR | Status: AC
  Administered 2019-10-12: 2 mg via INTRAVENOUS
  Filled 2019-10-12: qty 1

## 2019-10-12 MED ORDER — ACETAMINOPHEN 650 MG RE SUPP: 650.0000 mg | Freq: Four times a day (QID) | RECTAL | Status: DC | PRN

## 2019-10-12 MED ORDER — ONDANSETRON HCL 4 MG/2ML IJ SOLN
4.0000 mg | Freq: Once | INTRAMUSCULAR | Status: AC
  Administered 2019-10-12: 4 mg via INTRAVENOUS
  Filled 2019-10-12: qty 2

## 2019-10-12 MED ORDER — FOLIC ACID 1 MG PO TABS
1.0000 mg | ORAL_TABLET | Freq: Every day | ORAL | Status: DC
  Administered 2019-10-13 – 2019-10-28 (×16): 1 mg via ORAL
  Filled 2019-10-12 (×16): qty 1

## 2019-10-12 MED ORDER — SODIUM CHLORIDE 0.9 % IV SOLN
2.0000 g | Freq: Once | INTRAVENOUS | Status: AC
  Administered 2019-10-12: 2 g via INTRAVENOUS
  Filled 2019-10-12: qty 2

## 2019-10-12 MED ORDER — PANTOPRAZOLE SODIUM 40 MG PO TBEC
40.0000 mg | DELAYED_RELEASE_TABLET | Freq: Two times a day (BID) | ORAL | Status: DC | PRN
  Administered 2019-10-14: 40 mg via ORAL
  Filled 2019-10-12: qty 1

## 2019-10-12 MED ORDER — DULOXETINE HCL 30 MG PO CPEP
30.0000 mg | ORAL_CAPSULE | Freq: Every day | ORAL | Status: DC
  Administered 2019-10-12 – 2019-10-14 (×3): 30 mg via ORAL
  Filled 2019-10-12 (×3): qty 1

## 2019-10-12 MED ORDER — METOPROLOL SUCCINATE ER 50 MG PO TB24
50.0000 mg | ORAL_TABLET | Freq: Every day | ORAL | Status: DC
  Administered 2019-10-12 – 2019-10-28 (×17): 50 mg via ORAL
  Filled 2019-10-12 (×17): qty 1

## 2019-10-12 MED ORDER — VANCOMYCIN HCL 2000 MG/400ML IV SOLN
2000.0000 mg | Freq: Once | INTRAVENOUS | Status: AC
  Administered 2019-10-12: 2000 mg via INTRAVENOUS
  Filled 2019-10-12: qty 400

## 2019-10-12 MED ORDER — IOHEXOL 350 MG/ML SOLN
100.0000 mL | Freq: Once | INTRAVENOUS | Status: AC | PRN
  Administered 2019-10-12: 100 mL via INTRAVENOUS

## 2019-10-12 MED ORDER — SODIUM CHLORIDE 0.9 % IV BOLUS
1000.0000 mL | Freq: Once | INTRAVENOUS | Status: AC
  Administered 2019-10-12: 1000 mL via INTRAVENOUS

## 2019-10-12 NOTE — ED Provider Notes (Signed)
[facility] DEPT Provider Note   CSN: [id number] Arrival date & time: 10/12/19  1031     History Chief Complaint  Patient presents with  . Back Pain  . Chest Pain  . Leg Pain    William Ali is a 34 y.o. male.He has a history of metastatic lung cancer and is active with chemotherapy by Dr. Lorenso Courier.  He is chronic abdominal and back pain and is on Duragesic patches and Dilaudid.  Complaining of worse pain in his chest, generalized abdomen, lower back along with shakiness in his legs.  He says he feels like when he had his PE.  Has been taking his Eliquis.  Was also had nausea and vomiting and feels dehydrated.  No fevers.  Minimal cough sometimes with a little bit of blood.  The history is provided by the patient.  Chest Pain Pain location:  Epigastric and substernal area Pain quality: aching   Pain radiates to:  Lower back Pain severity:  Severe Onset quality:  Gradual Timing:  Constant Progression:  Unchanged Chronicity:  Recurrent Context: breathing and movement   Relieved by:  Nothing Worsened by:  Coughing, deep breathing and movement Ineffective treatments: narcotics. Associated symptoms: abdominal pain, back pain, nausea, shortness of breath and vomiting   Associated symptoms: no fever   Risk factors: male sex        Past Medical History:  Diagnosis Date  . Back pain   . Hypertension   . 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 Palliative care encounter   . Metastatic primary lung cancer ([place]) 06/17/2019  . 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 Pulmonary embolism ([place]) 06/13/2019  . DVT (deep venous thrombosis) ([place]) 06/13/2019  . Goals of care, counseling/discussion 05/21/2019  . 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 Adenocarcinoma of lung ([place]) 05/21/2019  . Essential hypertension 08/17/2018  . Gastroesophageal reflux  disease without esophagitis 08/17/2018    Past Surgical History:  Procedure Laterality Date  . IR IMAGING GUIDED PORT INSERTION  06/12/2019  . KNEE SURGERY         Family History  Problem Relation Age of Onset  . Hypertension Mother   . Diabetes Father     Social History   Tobacco Use  . Smoking status: Never Smoker  . Smokeless tobacco: Never Used  Vaping Use  . Vaping Use: Never used  Substance Use Topics  . Alcohol use: No  . Drug use: No    Home Medications Prior to Admission medications   Medication Sig Start Date End Date Taking? Authorizing Provider  albuterol (VENTOLIN HFA) 108 (90 Base) MCG/ACT inhaler TAKE 2 PUFFS BY MOUTH EVERY 6 HOURS AS NEEDED FOR WHEEZE OR SHORTNESS OF BREATH 06/29/19   Orson Slick, MD  alum & mag hydroxide-simeth (MAALOX MULTI SYMPTOM MAX ST) 132-440-10 MG/5ML suspension Take 10 mLs by mouth every 6 (six) hours as needed for indigestion. 07/08/19   Orson Slick, MD  dexamethasone (DECADRON) 1 MG tablet Take 1 tablet (1 mg total) by mouth daily. Starting on 5/11, and drop dose to once daily on 5/14 09/21/19   [name], MD  DULoxetine (CYMBALTA) 30 MG capsule Take 1 capsule (30 mg total) by mouth daily. 08/23/19   Elsie Stain, MD  fentaNYL (DURAGESIC) 75 MCG/HR Place 1 patch onto the skin every 3 (three)  days for 3 days. 10/09/19 10/12/19  Orson Slick, MD  fentaNYL (DURAGESIC) 75 MCG/HR Place 1 patch onto the skin every 3 (three) days. 10/09/19   Orson Slick, MD  folic acid (FOLVITE) 1 MG tablet Take 1 tablet (1 mg total) by mouth daily. 06/29/19   Orson Slick, MD  HYDROmorphone (DILAUDID) 8 MG tablet Take 1/2- 1 tablet every 4 hours as needed for pain 08/23/19   Elsie Stain, MD  lidocaine-prilocaine (EMLA) cream Apply 1 application topically as needed. 07/08/19   Orson Slick, MD  LORazepam (ATIVAN) 1 MG tablet Take 1 tablet (1 mg total) by mouth as directed. Please take 30 to 60 minutes prior to CT  scans. 09/06/19   Orson Slick, MD  metoprolol succinate (TOPROL XL) 50 MG 24 hr tablet Take 1 tablet (50 mg total) by mouth daily. Take with or immediately following a meal. 08/23/19 08/22/20  Elsie Stain, MD  ondansetron (ZOFRAN) 4 MG tablet Take 1 tablet (4 mg total) by mouth every 8 (eight) hours as needed for nausea or vomiting. 09/24/19   Elsie Stain, MD  pantoprazole (PROTONIX) 40 MG tablet Take 1 tablet (40 mg total) by mouth 2 (two) times daily. 09/08/19   Orson Slick, MD  polyethylene glycol (MIRALAX / GLYCOLAX) 17 g packet Take 17 g by mouth daily. 06/28/19   Acquanetta Chain, DO  rivaroxaban (XARELTO) 20 MG TABS tablet Take 1 tablet (20 mg total) by mouth daily with supper. 09/08/19   Orson Slick, MD  senna-docusate (SENOKOT-S) 8.6-50 MG tablet Take 2 tablets by mouth 2 (two) times daily. Patient taking differently: Take 3 tablets by mouth at bedtime.  06/28/19   Acquanetta Chain, DO  amLODipine (NORVASC) 5 MG tablet Take 1 tablet (5 mg total) by mouth daily. 04/21/18 08/04/19  Robyn Haber, MD  famotidine (PEPCID) 20 MG tablet Take 1 tablet (20 mg total) by mouth 2 (two) times daily. Patient taking differently: Take 20 mg by mouth 2 (two) times daily as needed for heartburn.  04/21/18 07/21/18  Robyn Haber, MD    Allergies    Onion, Other, Peanut oil, and Peanut-containing drug products  Review of Systems   Review of Systems  Constitutional: Negative for fever.  HENT: Negative for sore throat.   Eyes: Negative for visual disturbance.  Respiratory: Positive for shortness of breath.   Cardiovascular: Positive for chest pain. Negative for leg swelling.  Gastrointestinal: Positive for abdominal pain, nausea and vomiting.  Genitourinary: Negative for dysuria.  Musculoskeletal: Positive for back pain.  Skin: Negative for rash.  Neurological: Positive for tremors (legs).    Physical Exam Updated Vital Signs BP (!) 109/91 (BP Location: Left Arm)    Pulse (!) 170   Temp 98.5 F (36.9 C) (Oral)   Resp (!) 22   SpO2 99%   Physical Exam Vitals and nursing note reviewed.  Constitutional:      Appearance: Normal appearance. He is well-developed.  HENT:     Head: Normocephalic and atraumatic.  Eyes:     Conjunctiva/sclera: Conjunctivae normal.  Cardiovascular:     Rate and Rhythm: Regular rhythm. Tachycardia present.     Heart sounds: No murmur heard.   Pulmonary:     Effort: Pulmonary effort is normal. No respiratory distress.     Breath sounds: Normal breath sounds.  Abdominal:     Tenderness: There is abdominal tenderness (generalized).  Musculoskeletal:  General: No deformity or signs of injury. Normal range of motion.     Cervical back: Neck supple.     Right lower leg: No tenderness.     Left lower leg: No tenderness.  Skin:    General: Skin is warm and dry.     Capillary Refill: Capillary refill takes less than 2 seconds.  Neurological:     General: No focal deficit present.     Mental Status: He is alert.     ED Results / Procedures / Treatments   Labs (all labs ordered are listed, but only abnormal results are displayed) Labs Reviewed  CBC - Abnormal; Notable for the following components:      Result Value   RBC 2.77 (*)    Hemoglobin 8.1 (*)    HCT 27.8 (*)    MCV 100.4 (*)    MCHC 29.1 (*)    RDW 16.5 (*)    Platelets 124 (*)    nRBC 0.3 (*)    All other components within normal limits  PROTIME-INR - Abnormal; Notable for the following components:   Prothrombin Time 57.8 (*)    INR 6.9 (*)    All other components within normal limits  LACTIC ACID, PLASMA - Abnormal; Notable for the following components:   Lactic Acid, Venous 4.0 (*)    All other components within normal limits  LACTIC ACID, PLASMA - Abnormal; Notable for the following components:   Lactic Acid, Venous 2.1 (*)    All other components within normal limits  COMPREHENSIVE METABOLIC PANEL - Abnormal; Notable for the following  components:   Potassium 3.2 (*)    CO2 20 (*)    Glucose, Bld 170 (*)    Calcium 7.1 (*)    Total Protein 5.8 (*)    Albumin 2.7 (*)    AST 120 (*)    ALT 118 (*)    Alkaline Phosphatase 401 (*)    Total Bilirubin 1.5 (*)    All other components within normal limits  SARS CORONAVIRUS 2 BY RT PCR (HOSPITAL ORDER, [facility])  CULTURE, BLOOD (ROUTINE X 2)  CULTURE, BLOOD (ROUTINE X 2)  MRSA PCR SCREENING  LIPASE, BLOOD  PROTIME-INR  TROPONIN I (HIGH SENSITIVITY)  TROPONIN I (HIGH SENSITIVITY)    EKG EKG Interpretation  Date/Time:  Thursday October 12 2019 10:39:46 EDT Ventricular Rate:  162 PR Interval:    QRS Duration: 73 QT Interval:  250 QTC Calculation: 411 R Axis:   -16 Text Interpretation: Sinus tachycardia Probable left atrial enlargement Borderline left axis deviation Borderline repolarization abnormality 12 Lead; [place]ar rate increased from prior 5/21 Confirmed by Aletta Edouard [phone]) on 10/12/2019 11:18:20 AM   Radiology DG Chest 2 View  Result Date: 10/12/2019 CLINICAL DATA:  Chest pain, shortness of breath EXAM: CHEST - 2 VIEW COMPARISON:  06/17/2019 FINDINGS: Right medial upper lobe mass/opacity again noted. Left lung clear. Heart is normal size. No effusions. Right Port-A-Cath in place, unchanged. IMPRESSION: Right upper lobe medial masslike opacity again noted. Electronically Signed   By: Rolm Baptise M.D.   On: 10/12/2019 11:30   CT Head Wo Contrast  Result Date: 10/12/2019 CLINICAL DATA:  Headache EXAM: CT HEAD WITHOUT CONTRAST TECHNIQUE: Contiguous axial images were obtained from the base of the skull through the vertex without intravenous contrast. COMPARISON:  06/17/2019 MRI 09/06/2019 FINDINGS: Brain: Area of low-density seen in the left frontal lobe subcortical white matter. No hemorrhage or hydrocephalus. Vascular: No hyperdense vessel  or unexpected calcification. Skull: No acute calvarial abnormality. Sinuses/Orbits:  Mucosal thickening in the sphenoid sinuses. No change. Other: None IMPRESSION: Area of subcortical low-density in the left frontal lobe likely related to edema surrounding 1 of the many brain lesions seen on prior MRI. No hemorrhage. Electronically Signed   By: Rolm Baptise M.D.   On: 10/12/2019 14:06   CT Angio Chest PE W/Cm &/Or Wo Cm  Result Date: 10/12/2019 CLINICAL DATA:  PE suspected, metastatic lung cancer EXAM: CT ANGIOGRAPHY CHEST WITH CONTRAST TECHNIQUE: Multidetector CT imaging of the chest was performed using the standard protocol during bolus administration of intravenous contrast. Multiplanar CT image reconstructions and MIPs were obtained to evaluate the vascular anatomy. CONTRAST:  167m OMNIPAQUE IOHEXOL 350 MG/ML SOLN COMPARISON:  CT chest abdomen pelvis, 09/12/2019 FINDINGS: Cardiovascular: Satisfactory opacification of the pulmonary arteries to the segmental level. No evidence of pulmonary embolism. Normal heart size. No pericardial effusion. Right chest port catheter Mediastinum/Nodes: Unchanged enlarged pretracheal and right hilar lymph nodes. Thyroid gland, trachea, and esophagus demonstrate no significant findings. Lungs/Pleura: New small right pleural effusion and associated atelectasis or consolidation. Unchanged post treatment appearance of a right upper lobe mass and surrounding paramedian consolidation Upper Abdomen: No acute abnormality. Musculoskeletal: No chest wall abnormality. Unchanged, extensive sclerotic osseous metastatic disease, notable for a pathologic fracture of the T3 vertebral body. Review of the MIP images confirms the above findings. IMPRESSION: 1. Negative examination for pulmonary embolism. 2. New small right pleural effusion and associated atelectasis or consolidation. 3. Unchanged post treatment appearance of a right upper lobe mass and surrounding paramedian consolidation when compared to recent staging CT. 4. Unchanged enlarged mediastinal lymph nodes and  extensive sclerotic osseous metastatic disease. Electronically Signed   By: AEddie CandleM.D.   On: 10/12/2019 14:31   CT Abdomen Pelvis W Contrast  Result Date: 10/12/2019 CLINICAL DATA:  Abdominal pain EXAM: CT ABDOMEN AND PELVIS WITH CONTRAST TECHNIQUE: Multidetector CT imaging of the abdomen and pelvis was performed using the standard protocol following bolus administration of intravenous contrast. CONTRAST:  1017mOMNIPAQUE IOHEXOL 350 MG/ML SOLN COMPARISON:  09/12/2019 FINDINGS: Lower chest: Please see separately dictated CT angiogram of the chest. Hepatobiliary: Hepatic steatosis. Numerous hypodense, rim enhancing lesions throughout the liver parenchyma, not significantly changed compared to recent prior staging CT. No gallstones, gallbladder wall thickening, or biliary dilatation. Pancreas: Unremarkable. No pancreatic ductal dilatation or surrounding inflammatory changes. Spleen: Normal in size without significant abnormality. Adrenals/Urinary Tract: Adrenal glands are unremarkable. Kidneys are normal, without renal calculi, solid lesion, or hydronephrosis. Bladder is unremarkable. Stomach/Bowel: Stomach is within normal limits. Appendix appears normal. The colon is generally decompressed, however there is some suggestion of inflammatory wall thickening, particularly in the transverse colon (series 3, image 46). Vascular/Lymphatic: No significant vascular findings are present. Unchanged enlarged celiac axis lymph node measuring 1.0 x 0.8 cm (series 3, image 26). Reproductive: No mass or other significant abnormality. Other: No abdominal wall hernia or abnormality. No abdominopelvic ascites. Musculoskeletal: Numerous sclerotic osseous lesions, not significantly changed compared to prior examination. IMPRESSION: 1. The colon is generally decompressed, however there is some suggestion of inflammatory wall thickening, particularly in the transverse colon. Findings are suggestive of nonspecific infectious or  inflammatory colitis. 2. No significant change in hepatic and osseous metastatic disease compared to recent staging CT. 3. Hepatic steatosis. 4. Please see separately dictated CT examination of the chest. Electronically Signed   By: AlEddie Candle.D.   On: 10/12/2019 14:20    Procedures .Critical  Care Performed by: Hayden Rasmussen, MD Authorized by: Hayden Rasmussen, MD   Critical care provider statement:    Critical care time (minutes):  45   Critical care time was exclusive of:  Separately billable procedures and treating other patients   Critical care was necessary to treat or prevent imminent or life-threatening deterioration of the following conditions:  Cardiac failure and sepsis   Critical care was time spent personally by me on the following activities:  Discussions with consultants, evaluation of patient's response to treatment, examination of patient, ordering and performing treatments and interventions, ordering and review of laboratory studies, ordering and review of radiographic studies, pulse oximetry, re-evaluation of patient's condition, obtaining history from patient or surrogate, review of old charts and development of treatment plan with patient or surrogate   I assumed direction of critical care for this patient from another provider in my specialty: no     (including critical care time)  Medications Ordered in ED Medications  DULoxetine (CYMBALTA) DR capsule 30 mg (30 mg Oral Given 10/12/19 1600)  metoprolol succinate (TOPROL-XL) 24 hr tablet 50 mg (50 mg Oral Given 10/12/19 1600)  piperacillin-tazobactam (ZOSYN) IVPB 3.375 g (has no administration in time range)  sodium chloride 0.9 % bolus 1,000 mL (0 mLs Intravenous Stopped 10/12/19 1255)  HYDROmorphone (DILAUDID) injection 2 mg (2 mg Intravenous Given 10/12/19 1200)  ondansetron (ZOFRAN) injection 4 mg (4 mg Intravenous Given 10/12/19 1159)  HYDROmorphone (DILAUDID) injection 2 mg (2 mg Intravenous Given 10/12/19 1254)   iohexol (OMNIPAQUE) 350 MG/ML injection 100 mL (100 mLs Intravenous Contrast Given 10/12/19 1334)  HYDROmorphone (DILAUDID) injection 3 mg (3 mg Intravenous Given 10/12/19 1548)  ceFEPIme (MAXIPIME) 2 g in sodium chloride 0.9 % 100 mL IVPB (0 g Intravenous Stopped 10/12/19 1651)  vancomycin (VANCOREADY) IVPB 2000 mg/400 mL (2,000 mg Intravenous New Bag/Given 10/12/19 1623)  potassium chloride SA (KLOR-CON) CR tablet 20 mEq (20 mEq Oral Given 10/12/19 1600)  lactated ringers bolus 1,000 mL (1,000 mLs Intravenous New Bag/Given 10/12/19 1747)    ED Course  I have reviewed the triage vital signs and the nursing notes.  Pertinent labs & imaging results that were available during my care of the patient were reviewed by me and considered in my medical decision making (see chart for details).  Clinical Course as of Oct 12 1906  Thu Oct 12, 2019  1229 Lactate elevated at 4.0 although I do not think this is sepsis.  He is in significant pain tachycardic tachypneic.  Afebrile with a normal white count.  We will continue to trend given IV fluids and pain medication.   [MB]  1230 Patient still rates his pain as 6-8 out of 10.  Remains tachycardic in the 140s.  He is on chronic Dilaudid and 2 mg IV has not really touched him.  Have ordered 3.  He is on pulse ox monitoring.  Still think much of his fluids in.  Lactate is clearing though.   [MB]  9379 Reevaluated patient he said his pain is improved from a 8 to a 7.  Still tachycardic at 150.  Have ordered more pain medicine.  IV fluids infusing.  Labs showing normal white count, hemoglobin down from low baseline, thrombocytopenia similar to priors.  CMP with low potassium elevated glucose low bicarb, LFTs abnormal but have been in the past.  Troponin okay at 7.  Lipase normal.   [MB]  1242 Updated Dr. Lorenso Courier patient's primary oncologist regarding patient's complaints and current work-up.  He said he is surprised that he is in so much pain as he was adequately  controlled just last week.  He will see the patient in consult and he agrees that the patient will likely need to be admitted to the hospital.   [MB]  1244 Chest x-ray interpreted by me as right upper lobe mediastinal mass, has been seen before.  No gross infiltrates.   [MB]  32 CT brain no acute bleed evaluated, and upon some edema near one of the many mets that he has noted on his brain MRI.  CAT abdomen some inflammatory changes large bowel, mets to liver unchanged.   [MB]  3818 CT chest showing no acute PE.  They do comment upon a small effusion and some atelectasis versus consolidation.   [MB]  4037 Discussed with Dr. Neysa Bonito [facility] hospital[facility] who recommends covering with cefepime and Vanco see the patient for admission.   [MB]    Clinical Course User Index [MB] Hayden Rasmussen, MD   MDM Rules/Calculators/A&P                         This patient complains of severe chest abdomen and back pain, tachycardia; this involves an extensive number of treatment Options and is a complaint that carries with it a high risk of complications and Morbidity. The differential includes PE, dissection, perforation, sepsis, pneumonia, anemia, metabolic derangement  I ordered, reviewed and interpreted labs, which included CBC with normal white count low hemoglobin, chemistries with mildly low potassium and elevated glucose, LFTs elevated but have been this low in the past, lactic acid elevated possible infection, Covid testing negative, troponins low and flat, INR elevated I ordered medication IV fluids IV pain medicine IV antibiotics I ordered imaging studies which included chest x-ray, CT head chest abdomen and pelvis and I independently    visualized and interpreted imaging which showed possible edema around the met in the brain, inflammatory or infectious changes in the lungs, inflammatory infectious changes in the transverse colon Additional history obtained from patient's wife Previous records  obtained and reviewed in epic, is receiving chemotherapy with Dr. Lorenso Courier from oncology I consulted Dr. Lorenso Courier oncology and Dr. Neysa Bonito [facility] hospital[facility] and discussed lab and imaging findings  Critical Interventions: Work-up and management of patient's pain and tachycardia, possible sepsis  After the interventions stated above, I reevaluated the patient and found patient still to be tachycardic.  Remains hypertensive.  I have ordered his home beta-blocker.  You will need to be admitted to hospital for continued management of his symptoms.  Patient in agreement with plan.   Final Clinical Impression(s) / ED Diagnoses Final diagnoses:  Chest pain, unspecified type  Generalized abdominal pain  Primary malignant neoplasm of lung metastatic to other site, unspecified laterality ([place])  Tachycardia  Elevated lactic acid level    Rx / DC Orders ED Discharge Orders    None       Hayden Rasmussen, MD 10/12/19 1914

## 2019-10-12 NOTE — Progress Notes (Signed)
A consult was received from an ED physician for vancomycin per pharmacy dosing.  The patient's profile has been reviewed for ht/wt/allergies/indication/available labs.   A one time order has been placed for vancomycin 2 gm & cefepime 2 gm.    Further antibiotics/pharmacy consults should be ordered by admitting physician if indicated.                       Thank you, Eudelia Bunch, Pharm.D 10/12/2019 3:00 PM

## 2019-10-12 NOTE — Progress Notes (Signed)
Pharmacy Antibiotic Note  William Ali is a 34 y.o. male admitted on 10/12/2019 with worsening chest, abd & back pain.  Pharmacy has been consulted for Zosyn dosing for Intra-abd infection.  Plan: Vanc 1gm & Cefepime 2gm x1 in ED, followed by Zosyn 3.375g IV q8h (4 hour infusion).  Height: 5\' 7"  (170.2 cm) Weight: 95.3 kg (210 lb) IBW/kg (Calculated) : 66.1  Temp (24hrs), Avg:98.5 F (36.9 C), Min:98.4 F (36.9 C), Max:98.5 F (36.9 C)  Recent Labs  Lab 10/09/19 1133 10/12/19 1147 10/12/19 1148 10/12/19 1149 10/12/19 1255  WBC 3.1* 6.1  --   --   --   CREATININE 1.08  --   --  1.16  --   LATICACIDVEN  --   --  4.0*  --  2.1*    Estimated Creatinine Clearance: 97.8 mL/min (by C-G formula based on SCr of 1.16 mg/dL).    Allergies  Allergen Reactions  . Onion Anaphylaxis  . Other     All sea food causes swelling and a rash.    . Peanut Oil Itching  . Peanut-Containing Drug Products Itching    Antimicrobials this admission: 9/16 Vanc & Cefepime x1 ordered in ED 9/16 Zosyn >>   Dose adjustments this admission:  Microbiology results: 9/16 covid: neg 9/16 MRSA: ordered 9/16 BCx2:  ip  Thank you for allowing pharmacy to be a part of this patient's care.  Minda Ditto PharmD 10/12/2019 3:55 PM

## 2019-10-12 NOTE — ED Triage Notes (Signed)
Per pt, states he is having back pain and B/L leg pain-states he thinks he might have a blood clot

## 2019-10-12 NOTE — ED Notes (Signed)
pt refused his po dilaudid. He states that he takes this at home and it doesn't work. I have requested his fentynal patch from rx and gave him 10mg  of Labetalol IV to tx his BP and HR; most recent vitals charted

## 2019-10-12 NOTE — H&P (Signed)
History and Physical        Hospital Admission Note Date: 10/12/2019  Patient name: William Ali Medical record number: [id number] Date of birth: [birth year] Age: 34 y.o. Gender: male  PCP: Elsie Stain, MD  Patient coming from: home Lives with: wife At baseline, ambulates: independently  Chief Complaint    Chief Complaint  Patient presents with  . Back Pain  . Chest Pain  . 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 Lorenso Courier with most recent cycle of chemotherapy on 9/13 (Pemetrexed/Pembrolizumab (held Carboplatin due to cytopenias)), chronic cancer-related pain who has been having worsening cancer related pain of his abdomen and chest for the past 2 days along with diarrhea x2 days and associated malaise, pain in shakiness in his legs.  Stated that he felt like he did when he had his PE in the past.  States that he ran out of his fentanyl about 2 days ago.  Also had chemo on Monday but does not usually feel this way after chemo.  Denies any sick contacts.  Denies any fever.  Did have an episode of vomiting yesterday.  ED Course: Tachycardic up to 170 and tachypneic in the ED and hypertensive to 174/90 tolerating room air.  Notable labs: Potassium 3.2, CO2 20, glucose 170 alkaline phosphatase 401 (previous 596 on 9/13), a AST/ALT 120/118 (previously 117/160 on 9/13), WBC 6.1 (previous 3.1) Hb 8.1 platelets 124, lactic acid 4.0 INR 6.9 and PT 57.8.  Covid negative.  CT head, chest, abdomen and pelvis negative for PE but most notable for transverse colitis suggestive of infectious or inflammatory etiology as well as subcortical low density in the left frontal lobe likely related to edema from one of the many brain lesions seen from  prior MRIs.  Otherwise imaging unremarkable.  In the ED he received vancomycin and cefepime as well as IV fluids.  Vitals:   10/12/19 1541 10/12/19 1718  BP: (!) 155/88 (!) 157/97  Pulse: (!) 146 (!) 137  Resp: (!) 25 20  Temp: 98.4 F (36.9 C)   SpO2: 98% 96%     Review of Systems:  Review of Systems  Constitutional: Positive for malaise/fatigue.  Eyes: Positive for photophobia.  Respiratory: Negative for wheezing.   Cardiovascular: Negative for leg swelling.  Gastrointestinal: Positive for abdominal pain, diarrhea, nausea and vomiting. Negative for blood in stool.  Genitourinary: Negative.   All other systems reviewed and are negative.   Medical/Social/Family History   Past Medical History: Past Medical History:  Diagnosis Date  . Back pain   . Hypertension   . met lung ca to liver, spine, and brain dx'd 03/2019    Past Surgical History:  Procedure Laterality Date  . IR IMAGING GUIDED PORT INSERTION  06/12/2019  . KNEE SURGERY      Medications: Prior to Admission medications   Medication Sig Start Date End Date Taking? Authorizing Provider  albuterol (VENTOLIN HFA) 108 (90 Base) MCG/ACT inhaler TAKE 2 PUFFS BY MOUTH EVERY 6 HOURS AS NEEDED FOR WHEEZE OR SHORTNESS OF  BREATH Patient taking differently: Inhale 2 puffs into the lungs every 6 (six) hours as needed for wheezing or shortness of breath.  06/29/19  Yes Orson Slick, MD  DULoxetine (CYMBALTA) 30 MG capsule Take 1 capsule (30 mg total) by mouth daily. 08/23/19  Yes Elsie Stain, MD  folic acid (FOLVITE) 1 MG tablet Take 1 tablet (1 mg total) by mouth daily. 06/29/19  Yes Orson Slick, MD  HYDROmorphone (DILAUDID) 8 MG tablet Take 1/2- 1 tablet every 4 hours as needed for pain Patient taking differently: Take 4-8 mg by mouth 2 (two) times daily as needed for moderate pain or severe pain.  08/23/19  Yes Elsie Stain, MD  lidocaine-prilocaine (EMLA) cream Apply 1 application topically as  needed. Patient taking differently: Apply 1 application topically as needed (access port).  07/08/19  Yes Orson Slick, MD  LORazepam (ATIVAN) 1 MG tablet Take 1 tablet (1 mg total) by mouth as directed. Please take 30 to 60 minutes prior to CT scans. 09/06/19  Yes Orson Slick, MD  metoprolol succinate (TOPROL XL) 50 MG 24 hr tablet Take 1 tablet (50 mg total) by mouth daily. Take with or immediately following a meal. 08/23/19 08/22/20 Yes Elsie Stain, MD  ondansetron (ZOFRAN) 4 MG tablet Take 1 tablet (4 mg total) by mouth every 8 (eight) hours as needed for nausea or vomiting. 09/24/19  Yes Elsie Stain, MD  pantoprazole (PROTONIX) 40 MG tablet Take 1 tablet (40 mg total) by mouth 2 (two) times daily. Patient taking differently: Take 40 mg by mouth 2 (two) times daily as needed (indigestion).  09/08/19  Yes Orson Slick, MD  rivaroxaban (XARELTO) 20 MG TABS tablet Take 1 tablet (20 mg total) by mouth daily with supper. 09/08/19  Yes Orson Slick, MD  senna-docusate (SENOKOT-S) 8.6-50 MG tablet Take 2 tablets by mouth 2 (two) times daily. Patient taking differently: Take 2 tablets by mouth at bedtime.  06/28/19  Yes [name] L, DO  alum & mag hydroxide-simeth (MAALOX MULTI SYMPTOM MAX ST) 295-284-13 MG/5ML suspension Take 10 mLs by mouth every 6 (six) hours as needed for indigestion. Patient not taking: Reported on 10/12/2019 07/08/19   Orson Slick, MD  dexamethasone (DECADRON) 1 MG tablet Take 1 tablet (1 mg total) by mouth daily. Starting on 5/11, and drop dose to once daily on 5/14 Patient not taking: Reported on 10/12/2019 09/21/19   [name], MD  fentaNYL (DURAGESIC) 75 MCG/HR Place 1 patch onto the skin every 3 (three) days for 3 days. 10/09/19 10/12/19  Orson Slick, MD  fentaNYL (DURAGESIC) 75 MCG/HR Place 1 patch onto the skin every 3 (three) days. 10/09/19   Orson Slick, MD  polyethylene glycol (MIRALAX / GLYCOLAX) 17 g packet Take 17 g  by mouth daily. Patient not taking: Reported on 10/12/2019 06/28/19   Acquanetta Chain, DO  amLODipine (NORVASC) 5 MG tablet Take 1 tablet (5 mg total) by mouth daily. 04/21/18 08/04/19  Robyn Haber, MD  famotidine (PEPCID) 20 MG tablet Take 1 tablet (20 mg total) by mouth 2 (two) times daily. Patient taking differently: Take 20 mg by mouth 2 (two) times daily as needed for heartburn.  04/21/18 07/21/18  Robyn Haber, MD    Allergies:   Allergies  Allergen Reactions  . Onion Anaphylaxis  . Other     All sea food causes swelling and a rash.    Marland Kitchen  Peanut Oil Itching  . Peanut-Containing Drug Products Itching    Social History:  reports that he has never smoked. He has never used smokeless tobacco. He reports that he does not drink alcohol and does not use drugs.  Family History: Family History  Problem Relation Age of Onset  . Hypertension Mother   . Diabetes Father      Objective   Physical Exam: Blood pressure (!) 157/97, pulse (!) 137, temperature 98.4 F (36.9 C), temperature source Oral, resp. rate 20, height _0  (1.702 m), weight 95.3 kg, SpO2 96 %.  Physical Exam Vitals and nursing note reviewed.  Constitutional:      Appearance: He is ill-appearing.  HENT:     Head: Normocephalic and atraumatic.  Cardiovascular:     Rate and Rhythm: Regular rhythm. Tachycardia present.  Pulmonary:     Breath sounds: Normal breath sounds. No decreased breath sounds.  Chest:     Chest wall: Tenderness present.  Abdominal:     Tenderness: There is abdominal tenderness.     Comments: Left upper and lower quadrant tenderness to palpation  Musculoskeletal:     Right lower leg: No tenderness. No edema.     Left lower leg: No tenderness. No edema.  Neurological:     General: No focal deficit present.     Mental Status: He is alert.  Psychiatric:        Mood and Affect: Mood normal.        Behavior: Behavior normal.     LABS on Admission: I have personally reviewed all  the labs and imaging below    Basic Metabolic Panel: Recent Labs  Lab 10/09/19 1133 10/12/19 1149  NA 145 140  K 3.6 3.2*  CL 109 107  CO2 24 20*  GLUCOSE 139* 170*  BUN 8 9  CREATININE 1.08 1.16  CALCIUM 8.5* 7.1*   Liver Function Tests: Recent Labs  Lab 10/09/19 1133 10/12/19 1149  AST 117* 120*  ALT 160* 118*  ALKPHOS 596* 401*  BILITOT 0.5 1.5*  PROT 6.4* 5.8*  ALBUMIN 3.0* 2.7*   Recent Labs  Lab 10/12/19 1149  LIPASE 19   No results for input(s): AMMONIA in the last 168 hours. CBC: Recent Labs  Lab 10/09/19 1133 10/09/19 1133 10/12/19 1147  WBC 3.1*  --  6.1  NEUTROABS 2.0  --   --   HGB 8.7*  --  8.1*  HCT 29.5*  --  27.8*  MCV 100.7*   < > 100.4*  PLT 146*  --  124*   < > = values in this interval not displayed.   Cardiac Enzymes: No results for input(s): CKTOTAL, CKMB, CKMBINDEX, TROPONINI in the last 168 hours. BNP: Invalid input(s): POCBNP CBG: No results for input(s): GLUCAP in the last 168 hours.  Radiological Exams on Admission:  DG Chest 2 View  Result Date: 10/12/2019 CLINICAL DATA:  Chest pain, shortness of breath EXAM: CHEST - 2 VIEW COMPARISON:  06/17/2019 FINDINGS: Right medial upper lobe mass/opacity again noted. Left lung clear. Heart is normal size. No effusions. Right Port-A-Cath in place, unchanged. IMPRESSION: Right upper lobe medial masslike opacity again noted. Electronically Signed   By: Rolm Baptise M.D.   On: 10/12/2019 11:30   CT Head Wo Contrast  Result Date: 10/12/2019 CLINICAL DATA:  Headache EXAM: CT HEAD WITHOUT CONTRAST TECHNIQUE: Contiguous axial images were obtained from the base of the skull through the vertex without intravenous contrast. COMPARISON:  06/17/2019 MRI 09/06/2019 FINDINGS: Brain:  Area of low-density seen in the left frontal lobe subcortical white matter. No hemorrhage or hydrocephalus. Vascular: No hyperdense vessel or unexpected calcification. Skull: No acute calvarial abnormality. Sinuses/Orbits:  Mucosal thickening in the sphenoid sinuses. No change. Other: None IMPRESSION: Area of subcortical low-density in the left frontal lobe likely related to edema surrounding 1 of the many brain lesions seen on prior MRI. No hemorrhage. Electronically Signed   By: Rolm Baptise M.D.   On: 10/12/2019 14:06   CT Angio Chest PE W/Cm &/Or Wo Cm  Result Date: 10/12/2019 CLINICAL DATA:  PE suspected, metastatic lung cancer EXAM: CT ANGIOGRAPHY CHEST WITH CONTRAST TECHNIQUE: Multidetector CT imaging of the chest was performed using the standard protocol during bolus administration of intravenous contrast. Multiplanar CT image reconstructions and MIPs were obtained to evaluate the vascular anatomy. CONTRAST:  153m OMNIPAQUE IOHEXOL 350 MG/ML SOLN COMPARISON:  CT chest abdomen pelvis, 09/12/2019 FINDINGS: Cardiovascular: Satisfactory opacification of the pulmonary arteries to the segmental level. No evidence of pulmonary embolism. Normal heart size. No pericardial effusion. Right chest port catheter Mediastinum/Nodes: Unchanged enlarged pretracheal and right hilar lymph nodes. Thyroid gland, trachea, and esophagus demonstrate no significant findings. Lungs/Pleura: New small right pleural effusion and associated atelectasis or consolidation. Unchanged post treatment appearance of a right upper lobe mass and surrounding paramedian consolidation Upper Abdomen: No acute abnormality. Musculoskeletal: No chest wall abnormality. Unchanged, extensive sclerotic osseous metastatic disease, notable for a pathologic fracture of the T3 vertebral body. Review of the MIP images confirms the above findings. IMPRESSION: 1. Negative examination for pulmonary embolism. 2. New small right pleural effusion and associated atelectasis or consolidation. 3. Unchanged post treatment appearance of a right upper lobe mass and surrounding paramedian consolidation when compared to recent staging CT. 4. Unchanged enlarged mediastinal lymph nodes and  extensive sclerotic osseous metastatic disease. Electronically Signed   By: AEddie CandleM.D.   On: 10/12/2019 14:31   CT Abdomen Pelvis W Contrast  Result Date: 10/12/2019 CLINICAL DATA:  Abdominal pain EXAM: CT ABDOMEN AND PELVIS WITH CONTRAST TECHNIQUE: Multidetector CT imaging of the abdomen and pelvis was performed using the standard protocol following bolus administration of intravenous contrast. CONTRAST:  1049mOMNIPAQUE IOHEXOL 350 MG/ML SOLN COMPARISON:  09/12/2019 FINDINGS: Lower chest: Please see separately dictated CT angiogram of the chest. Hepatobiliary: Hepatic steatosis. Numerous hypodense, rim enhancing lesions throughout the liver parenchyma, not significantly changed compared to recent prior staging CT. No gallstones, gallbladder wall thickening, or biliary dilatation. Pancreas: Unremarkable. No pancreatic ductal dilatation or surrounding inflammatory changes. Spleen: Normal in size without significant abnormality. Adrenals/Urinary Tract: Adrenal glands are unremarkable. Kidneys are normal, without renal calculi, solid lesion, or hydronephrosis. Bladder is unremarkable. Stomach/Bowel: Stomach is within normal limits. Appendix appears normal. The colon is generally decompressed, however there is some suggestion of inflammatory wall thickening, particularly in the transverse colon (series 3, image 46). Vascular/Lymphatic: No significant vascular findings are present. Unchanged enlarged celiac axis lymph node measuring 1.0 x 0.8 cm (series 3, image 26). Reproductive: No mass or other significant abnormality. Other: No abdominal wall hernia or abnormality. No abdominopelvic ascites. Musculoskeletal: Numerous sclerotic osseous lesions, not significantly changed compared to prior examination. IMPRESSION: 1. The colon is generally decompressed, however there is some suggestion of inflammatory wall thickening, particularly in the transverse colon. Findings are suggestive of nonspecific infectious or  inflammatory colitis. 2. No significant change in hepatic and osseous metastatic disease compared to recent staging CT. 3. Hepatic steatosis. 4. Please see separately dictated CT examination of the  chest. Electronically Signed   By: Eddie Candle M.D.   On: 10/12/2019 14:20      EKG: Independently reviewed.    A & P   Principal Problem:   Sepsis ([place]) Active Problems:   Metastatic adenocarcinoma to brain [facility])   Adenocarcinoma of lung ([place])   Essential hypertension   Tachycardia   Colitis   Hypokalemia   1. Sepsis of unknown etiology, suspect secondary to colitis a. Sepsis criteria: Tachycardic, tachypneic, lactic acid 4.0 b. CT concerning for possible transverse colon infectious versus inflammatory colitis c. Received vancomycin and cefepime in the ED d. IV fluids per sepsis protocol e. Continue on Zosyn f. Clear liquid diet g. Follow-up cultures h. Stool studies  2. Acute on chronic cancer related pain a. Patient ran out of some of his pain meds at home few days ago b. Restart home pain regimen c. Palliative care consulted  3. Abdominal and chest pain, likely from his cancer a. Continue pain management as above  4. Metastatic adenocarcinoma of the lung to brain, bone, liver a. [name] oncologist, Dr. Lorenso Courier, notified  5. Supratherapeutic INR a. INR 6.9 b. Patient is on Xarelto c. Discussed with patient's oncologist who states this is likely lab error setting of Xarelto and no need to change management  6. Tachycardia  Hypertension a. Continue home metoprolol succinate b. Labetalol as needed  7. Hypokalemia a. Replete  8. History of PE a. CTA chest negative for PE b. Continue Xarelto   DVT prophylaxis: xarelto   Code Status: Prior  Diet: clear liquid Family Communication: Admission, patients condition and plan of care including tests being ordered have been discussed with the patient who indicates understanding and agrees with the plan and Code  Status. [name] wife was updated  Disposition Plan: The appropriate patient status for this patient is INPATIENT. Inpatient status is judged to be reasonable and necessary in order to provide the required intensity of service to ensure the patient's safety. The patient's presenting symptoms, physical exam findings, and initial radiographic and laboratory data in the context of their chronic comorbidities is felt to place them at high risk for further clinical deterioration. Furthermore, it is not anticipated that the patient will be medically stable for discharge from the hospital within 2 midnights of admission. The following factors support the patient status of inpatient.   " The patient's presenting symptoms include cancer related pain, diarrhea, malaise. " The worrisome physical exam findings include appears ill, abdominal pain. " The initial radiographic and laboratory data are worrisome because of lactic acidosis, elevated LFTs. " The chronic co-morbidities include metastatic cancer, hypertension, PE.   * I certify that at the point of admission it is my clinical judgment that the patient will require inpatient hospital care spanning beyond 2 midnights from the point of admission due to high intensity of service, high risk for further deterioration and high frequency of surveillance required.*   Status is: Inpatient  Remains inpatient appropriate because:Inpatient level of care appropriate due to severity of illness   Dispo: The patient is from: Home              Anticipated d/c is to: Home              Anticipated d/c date is: 2 days              Patient currently is not medically stable to d/c.        The medical decision making on this patient was of  high complexity and the patient is at high risk for clinical deterioration, therefore this is a level 3  admission.  Consultants  . Oncology . palliative  Procedures  . none  Time Spent on Admission: 76 minutes     Harold Hedge, DO [facility] Pager [phone] 10/12/2019, 5:31 PM

## 2019-10-13 ENCOUNTER — Inpatient Hospital Stay (HOSPITAL_COMMUNITY): Payer: PRIVATE HEALTH INSURANCE

## 2019-10-13 DIAGNOSIS — A419 Sepsis, unspecified organism: Secondary | ICD-10-CM

## 2019-10-13 DIAGNOSIS — C3491 Malignant neoplasm of unspecified part of right bronchus or lung: Secondary | ICD-10-CM

## 2019-10-13 DIAGNOSIS — M7989 Other specified soft tissue disorders: Secondary | ICD-10-CM

## 2019-10-13 LAB — BASIC METABOLIC PANEL
Anion gap: 14 (ref 5–15)
BUN: 8 mg/dL (ref 6–20)
CO2: 24 mmol/L (ref 22–32)
Calcium: 7.5 mg/dL — ABNORMAL LOW (ref 8.9–10.3)
Chloride: 103 mmol/L (ref 98–111)
Creatinine, Ser: 1.13 mg/dL (ref 0.61–1.24)
GFR calc Af Amer: >60 mL/min (ref >60)
GFR calc non Af Amer: >60 mL/min (ref >60)
Glucose, Bld: 147 mg/dL — ABNORMAL HIGH (ref 70–99)
Potassium: 3.5 mmol/L (ref 3.5–5.1)
Sodium: 141 mmol/L (ref 135–145)

## 2019-10-13 LAB — CBC WITH DIFFERENTIAL/PLATELET
Abs Immature Granulocytes: 0.02 10*3/uL (ref 0.00–0.07)
Basophils Absolute: 0 10*3/uL (ref 0.0–0.1)
Basophils Relative: 0 %
Eosinophils Absolute: 0 10*3/uL (ref 0.0–0.5)
Eosinophils Relative: 0 %
HCT: 24.2 % — ABNORMAL LOW (ref 39.0–52.0)
Hemoglobin: 7.4 g/dL — ABNORMAL LOW (ref 13.0–17.0)
Immature Granulocytes: 1 %
Lymphocytes Relative: 2 %
Lymphs Abs: 0.1 10*3/uL — ABNORMAL LOW (ref 0.7–4.0)
MCH: 30.3 pg (ref 26.0–34.0)
MCHC: 30.6 g/dL (ref 30.0–36.0)
MCV: 99.2 fL (ref 80.0–100.0)
Monocytes Absolute: 0.2 10*3/uL (ref 0.1–1.0)
Monocytes Relative: 5 %
Neutro Abs: 3.7 10*3/uL (ref 1.7–7.7)
Neutrophils Relative %: 92 %
Platelets: 124 10*3/uL — ABNORMAL LOW (ref 150–400)
RBC: 2.44 MIL/uL — ABNORMAL LOW (ref 4.22–5.81)
RDW: 16.8 % — ABNORMAL HIGH (ref 11.5–15.5)
WBC: 4 10*3/uL (ref 4.0–10.5)
nRBC: 0 % (ref 0.0–0.2)

## 2019-10-13 LAB — CLOSTRIDIUM DIFFICILE BY PCR, REFLEXED: Toxigenic C. Difficile by PCR: POSITIVE — AB

## 2019-10-13 LAB — PROTIME-INR
INR: 2.7 — ABNORMAL HIGH (ref 0.8–1.2)
Prothrombin Time: 27.5 seconds — ABNORMAL HIGH (ref 11.4–15.2)

## 2019-10-13 LAB — C DIFFICILE QUICK SCREEN W PCR REFLEX
C Diff antigen: POSITIVE — AB
C Diff toxin: NEGATIVE

## 2019-10-13 LAB — PHOSPHORUS: Phosphorus: 2.8 mg/dL (ref 2.5–4.6)

## 2019-10-13 LAB — LACTIC ACID, PLASMA: Lactic Acid, Venous: 1.8 mmol/L (ref 0.5–1.9)

## 2019-10-13 LAB — MAGNESIUM: Magnesium: 1.2 mg/dL — ABNORMAL LOW (ref 1.7–2.4)

## 2019-10-13 MED ORDER — POLYVINYL ALCOHOL 1.4 % OP SOLN
1.0000 [drp] | OPHTHALMIC | Status: DC | PRN
  Administered 2019-10-15: 1 [drp] via OPHTHALMIC
  Filled 2019-10-13: qty 15

## 2019-10-13 MED ORDER — POLYETHYLENE GLYCOL 3350 17 G PO PACK: 17.0000 g | PACK | Freq: Every day | ORAL | Status: DC | PRN

## 2019-10-13 MED ORDER — SODIUM CHLORIDE 0.9 % IV SOLN: INTRAVENOUS | Status: DC

## 2019-10-13 MED ORDER — LACTATED RINGERS IV SOLN: INTRAVENOUS | Status: DC

## 2019-10-13 MED ORDER — HYDROMORPHONE HCL 2 MG/ML IJ SOLN: 3.0000 mg | INTRAMUSCULAR | Status: DC | PRN

## 2019-10-13 MED ORDER — LIDOCAINE 5 % EX PTCH
1.0000 | MEDICATED_PATCH | CUTANEOUS | Status: DC
  Administered 2019-10-15 – 2019-10-28 (×13): 1 via TRANSDERMAL
  Filled 2019-10-13 (×17): qty 1

## 2019-10-13 MED ORDER — SENNA 8.6 MG PO TABS
1.0000 | ORAL_TABLET | Freq: Every day | ORAL | Status: DC
  Administered 2019-10-13 – 2019-10-18 (×4): 8.6 mg via ORAL
  Filled 2019-10-13 (×5): qty 1

## 2019-10-13 MED ORDER — HYDROMORPHONE HCL 1 MG/ML IJ SOLN
3.0000 mg | INTRAMUSCULAR | Status: DC | PRN
  Administered 2019-10-13 – 2019-10-14 (×4): 3 mg via INTRAVENOUS
  Filled 2019-10-13 (×4): qty 3

## 2019-10-13 MED ORDER — MAGNESIUM SULFATE 2 GM/50ML IV SOLN
2.0000 g | Freq: Once | INTRAVENOUS | Status: AC
  Administered 2019-10-13: 2 g via INTRAVENOUS
  Filled 2019-10-13 (×2): qty 50

## 2019-10-13 MED ORDER — CHLORHEXIDINE GLUCONATE CLOTH 2 % EX PADS
6.0000 | MEDICATED_PAD | Freq: Every day | CUTANEOUS | Status: DC
  Administered 2019-10-14 – 2019-10-28 (×15): 6 via TOPICAL

## 2019-10-13 NOTE — ED Notes (Signed)
Attempted to call report X1.

## 2019-10-13 NOTE — Consult Note (Signed)
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 Short term f/u of this lesion was recommended. CXR on 05/01/2018 performed with recommended f/u imaging. 2) 05/10/2019: presented to the ED with headache and left pelvis pain. Patient underwent CT C/A/P and MRI brain. Brain MRI showed numerous enhancing parenchymal intracranial lesions likely reflecting metastases. CT scan showed cavitary lesion in the right lung apex with spiculated margins. Additional sites of disease include lymph nodes of the chest and numerous liver mets. In the bones there were multiple lytic appearing lesions throughout the thoracic and lumbar spine as well as portions of the bony pelvis. Referred to Oncology. 3) 05/16/2019: US biopsy of the liver performed. Findings consistent with a poorly differentiated adenocarcinoma.  4) 05/19/2019: establish care with Dr. Lorenso Courier.  5) 05/30/2019: CT simulation. Received Radiation to brain/spine.  5) 06/23/2019: Received Cycle 1 Day 1 of Carbo/Pemetrexed while inpatient. Pembrolizumab not able to be administered in inpatient setting. 6) 07/13/2019:Cycle 2 Day 1 of Carboplatin/Pemetrexed/Pembrolizumab 7)  08/04/2019:Cycle 3 day 1 of Carboplatin/Pemetrexed/Pembrolizumab 8) 08/24/2019: Cycle 4 day 1 of Carboplatin/Pemetrexed/Pembrolizumab 9) 09/14/2019: Cycle 5 day 1 of Carboplatin/Pemetrexed/Pembrolizumab 10) 10/09/2019: Cycle 6 day 1 of Pemetrexed/Pembrolizumab (held Carboplatin due to cytopenias. Intended to begin holding on cycle 7  Interval History:  William Ali 34 y.o. male with medical history significant for metastatic adenocarcinoma of the lung who presented with intractable  pain, nausea, and vomiting.  [name] was last seen on our office on 10/09/2019 for cycle 6-day 1 of continued chemotherapy.  [name] called our clinic yesterday noting that he had developed uncontrollable pain.  It was recommended that he present to the emergency department in order to begin administering IV pain medication in order to help control his symptoms.  While in the emergency department he had a CT scan of the chest abdomen pelvis which showed some concern for inflammation within the transverse colon.  Due to intractable pain and this current concern for colitis the patient was admitted to the general medicine service for further evaluation and management.  On exam today I had the opportunity to speak with [name] and his wife.  They note that he had a gap in his fentanyl patches between doses.  He was without fentanyl for 2 days time which may have been the cause for his recent flare in pain.  He was continuing to use his p.o. Dilaudid as prescribed, though of note during our last visit he was noticing increased usage.  He is also redeveloped headaches in the interim since his last visit, which may be attributed to the tapering down of his steroid dosing.  He is also had issues with nausea, vomiting, and diarrhea this week as well.  This may be attributable to his decreased pain control versus the potential colitis seen on CT scan.  They currently deny that he is having any fevers, chills, sweats or other infectious symptoms at this time.  He is not having any overt signs of bleeding, though his INR was noted to be greater than 6 on presentation (INR is a little utility in the setting of DOAC therapy).  All questions and concerns from the patient and the wife were addressed.  A full 10 point ROS is listed  below.    MEDICAL HISTORY:  Past Medical History:  Diagnosis Date  . Back pain   . Hypertension   . met lung ca to liver, spine, and brain dx'd 03/2019    SURGICAL HISTORY: Past  Surgical History:  Procedure Laterality Date  . IR IMAGING GUIDED PORT INSERTION  06/12/2019  . KNEE SURGERY      SOCIAL HISTORY: Social History   Socioeconomic History  . Marital status: Married    Spouse name: Not on file  . Number of children: Not on file  . Years of education: Not on file  . Highest education level: Not on file  Occupational History  . Not on file  Tobacco Use  . Smoking status: Never Smoker  . Smokeless tobacco: Never Used  Vaping Use  . Vaping Use: Never used  Substance and Sexual Activity  . Alcohol use: No  . Drug use: No  . Sexual activity: Yes  Other Topics Concern  . Not on file  Social History Narrative  . Not on file   Social Determinants of Health   Financial Resource Strain:   . Difficulty of Paying Living Expenses: Not on file  Food Insecurity:   . Worried About Charity fundraiser in the Last Year: Not on file  . Ran Out of Food in the Last Year: Not on file  Transportation Needs:   . Lack of Transportation (Medical): Not on file  . Lack of Transportation (Non-Medical): Not on file  Physical Activity:   . Days of Exercise per Week: Not on file  . Minutes of Exercise per Session: Not on file  Stress:   . Feeling of Stress : Not on file  Social Connections:   . Frequency of Communication with Friends and Family: Not on file  . Frequency of Social Gatherings with Friends and Family: Not on file  . Attends Religious Services: Not on file  . Active Member of Clubs or Organizations: Not on file  . Attends Archivist Meetings: Not on file  . Marital Status: Not on file  Intimate Partner Violence:   . Fear of Current or Ex-Partner: Not on file  . Emotionally Abused: Not on file  . Physically Abused: Not on file  . Sexually Abused: Not on file    FAMILY HISTORY: Family History  Problem Relation Age of Onset  . Hypertension Mother   . Diabetes Father     ALLERGIES:  is allergic to onion, other, peanut oil, and  peanut-containing drug products.  MEDICATIONS:  Current Facility-Administered Medications  Medication Dose Route Frequency Provider Last Rate Last Admin  . acetaminophen (TYLENOL) tablet 650 mg  650 mg Oral Q6H PRN Harold Hedge, MD   650 mg at 10/13/19 9147   Or  . acetaminophen (TYLENOL) suppository 650 mg  650 mg Rectal Q6H PRN Harold Hedge, MD      . DULoxetine (CYMBALTA) DR capsule 30 mg  30 mg Oral Daily Harold Hedge, MD   30 mg at 10/13/19 8295  . fentaNYL (DURAGESIC) 75 MCG/HR 1 patch  1 patch Transdermal Q72H Harold Hedge, MD   1 patch at 10/13/19 0036  . folic acid (FOLVITE) tablet 1 mg  1 mg Oral Daily Harold Hedge, MD   1 mg at 10/13/19 6213  . HYDROmorphone (DILAUDID) injection 3 mg  3 mg Intravenous Q4H PRN Dahal, Binaya, MD      . HYDROmorphone (DILAUDID) tablet 4-8 mg  4-8 mg Oral  BID PRN Harold Hedge, MD   8 mg at 10/13/19 1114  . labetalol (NORMODYNE) injection 10 mg  10 mg Intravenous Q2H PRN Harold Hedge, MD   10 mg at 10/12/19 2257  . lactated ringers infusion   Intravenous Continuous Terrilee Croak, MD   Held at 10/13/19 1105  . lidocaine (LIDODERM) 5 % 1 patch  1 patch Transdermal Q24H Dahal, Binaya, MD      . magnesium sulfate IVPB 2 g 50 mL  2 g Intravenous Once Dahal, Binaya, MD      . metoprolol succinate (TOPROL-XL) 24 hr tablet 50 mg  50 mg Oral Daily Harold Hedge, MD   50 mg at 10/13/19 0927  . ondansetron (ZOFRAN) tablet 4 mg  4 mg Oral Q8H PRN Harold Hedge, MD   4 mg at 10/13/19 1114  . pantoprazole (PROTONIX) EC tablet 40 mg  40 mg Oral BID PRN Harold Hedge, MD      . piperacillin-tazobactam (ZOSYN) IVPB 3.375 g  3.375 g Intravenous Q8H Green, Terri L, RPH 12.5 mL/hr at 10/13/19 0829 3.375 g at 10/13/19 0829  . polyethylene glycol (MIRALAX / GLYCOLAX) packet 17 g  17 g Oral Daily PRN Dahal, Binaya, MD      . rivaroxaban (XARELTO) tablet 20 mg  20 mg Oral Q supper Harold Hedge, MD   20 mg at 10/13/19 0035  . senna (SENOKOT) tablet 8.6 mg  1  tablet Oral QHS Dahal, Binaya, MD      . sodium chloride flush (NS) 0.9 % injection 3 mL  3 mL Intravenous Q12H Harold Hedge, MD   3 mL at 10/13/19 0929    REVIEW OF SYSTEMS:   Constitutional: ( - ) fevers, ( - )  chills , ( - ) night sweats Eyes: ( - ) blurriness of vision, ( - ) double vision, ( - ) watery eyes Ears, nose, mouth, throat, and face: ( - ) mucositis, ( - ) sore throat Respiratory: ( - ) cough, ( - ) dyspnea, ( - ) wheezes Cardiovascular: ( - ) palpitation, ( - ) chest discomfort, ( - ) lower extremity swelling Gastrointestinal:  ( + ) nausea, ( - ) heartburn, ( - ) change in bowel habits Skin: ( - ) abnormal skin rashes Lymphatics: ( - ) new lymphadenopathy, ( - ) easy bruising Neurological: ( - ) numbness, ( - ) tingling, ( - ) new weaknesses Behavioral/Psych: ( - ) mood change, ( - ) new changes  All other systems were reviewed with the patient and are negative.  PHYSICAL EXAMINATION: ECOG PERFORMANCE STATUS: 2 - Symptomatic, <50% confined to bed  Vitals:   10/13/19 1431 10/13/19 1611  BP: 103/76 135/72  Pulse: (!) 103 (!) 113  Resp: (!) 29 (!) 21  Temp:  97.9 F (36.6 C)  SpO2: 100% 100%   Filed Weights   10/12/19 1541  Weight: 210 lb (95.3 kg)    GENERAL: well appearing young [place] American male, appears in pain.  SKIN: skin color, texture, turgor are normal, no rashes or significant lesions NECK: supple, nontender LYMPH: fullness neck with no frank lymphadenopathy.  EYES: conjunctiva are pink and non-injected, sclera clear LUNGS: clear to auscultation and percussion with normal breathing effort HEART: regular rate & rhythm and no murmurs and no lower extremity edema ABDOMEN: tender abdomen, particularly in right upper quadrant. Appears more tender than baseline.  Musculoskeletal: no cyanosis of digits and no clubbing. Increased swelling inferior  to axilla bilaterally. Also noted to have soft tissue swelling in left leg  PSYCH: alert & oriented x 3,  fluent speech NEURO: no focal motor/sensory deficits  LABORATORY DATA:  I have reviewed the data as listed CBC Latest Ref Rng & Units 10/13/2019 10/12/2019 10/09/2019  WBC 4.0 - 10.5 K/uL 4.0 6.1 3.1(L)  Hemoglobin 13.0 - 17.0 g/dL 7.4(L) 8.1(L) 8.7(L)  Hematocrit 39 - 52 % 24.2(L) 27.8(L) 29.5(L)  Platelets 150 - 400 K/uL 124(L) 124(L) 146(L)    CMP Latest Ref Rng & Units 10/13/2019 10/12/2019 10/09/2019  Glucose 70 - 99 mg/dL 147(H) 170(H) 139(H)  BUN 6 - 20 mg/dL _0 Creatinine 0.61 - 1.24 mg/dL 1.13 1.16 1.08  Sodium 135 - 145 mmol/L 141 140 145  Potassium 3.5 - 5.1 mmol/L 3.5 3.2(L) 3.6  Chloride 98 - 111 mmol/L 103 107 109  CO2 22 - 32 mmol/L 24 20(L) 24  Calcium 8.9 - 10.3 mg/dL 7.5(L) 7.1(L) 8.5(L)  Total Protein 6.5 - 8.1 g/dL - 5.8(L) 6.4(L)  Total Bilirubin 0.3 - 1.2 mg/dL - 1.5(H) 0.5  Alkaline Phos 38 - 126 U/L - 401(H) 596(H)  AST 15 - 41 U/L - 120(H) 117(H)  ALT 0 - 44 U/L - 118(H) 160(H)    RADIOGRAPHIC STUDIES: DG Chest 2 View  Result Date: 10/12/2019 CLINICAL DATA:  Chest pain, shortness of breath EXAM: CHEST - 2 VIEW COMPARISON:  06/17/2019 FINDINGS: Right medial upper lobe mass/opacity again noted. Left lung clear. Heart is normal size. No effusions. Right Port-A-Cath in place, unchanged. IMPRESSION: Right upper lobe medial masslike opacity again noted. Electronically Signed   By: Rolm Baptise M.D.   On: 10/12/2019 11:30   CT Head Wo Contrast  Result Date: 10/12/2019 CLINICAL DATA:  Headache EXAM: CT HEAD WITHOUT CONTRAST TECHNIQUE: Contiguous axial images were obtained from the base of the skull through the vertex without intravenous contrast. COMPARISON:  06/17/2019 MRI 09/06/2019 FINDINGS: Brain: Area of low-density seen in the left frontal lobe subcortical white matter. No hemorrhage or hydrocephalus. Vascular: No hyperdense vessel or unexpected calcification. Skull: No acute calvarial abnormality. Sinuses/Orbits: Mucosal thickening in the sphenoid sinuses. No  change. Other: None IMPRESSION: Area of subcortical low-density in the left frontal lobe likely related to edema surrounding 1 of the many brain lesions seen on prior MRI. No hemorrhage. Electronically Signed   By: Rolm Baptise M.D.   On: 10/12/2019 14:06   CT Angio Chest PE W/Cm &/Or Wo Cm  Result Date: 10/12/2019 CLINICAL DATA:  PE suspected, metastatic lung cancer EXAM: CT ANGIOGRAPHY CHEST WITH CONTRAST TECHNIQUE: Multidetector CT imaging of the chest was performed using the standard protocol during bolus administration of intravenous contrast. Multiplanar CT image reconstructions and MIPs were obtained to evaluate the vascular anatomy. CONTRAST:  148m OMNIPAQUE IOHEXOL 350 MG/ML SOLN COMPARISON:  CT chest abdomen pelvis, 09/12/2019 FINDINGS: Cardiovascular: Satisfactory opacification of the pulmonary arteries to the segmental level. No evidence of pulmonary embolism. Normal heart size. No pericardial effusion. Right chest port catheter Mediastinum/Nodes: Unchanged enlarged pretracheal and right hilar lymph nodes. Thyroid gland, trachea, and esophagus demonstrate no significant findings. Lungs/Pleura: New small right pleural effusion and associated atelectasis or consolidation. Unchanged post treatment appearance of a right upper lobe mass and surrounding paramedian consolidation Upper Abdomen: No acute abnormality. Musculoskeletal: No chest wall abnormality. Unchanged, extensive sclerotic osseous metastatic disease, notable for a pathologic fracture of the T3 vertebral body. Review of the MIP images confirms the above findings. IMPRESSION: 1. Negative examination for  pulmonary embolism. 2. New small right pleural effusion and associated atelectasis or consolidation. 3. Unchanged post treatment appearance of a right upper lobe mass and surrounding paramedian consolidation when compared to recent staging CT. 4. Unchanged enlarged mediastinal lymph nodes and extensive sclerotic osseous metastatic disease.  Electronically Signed   By: Eddie Candle M.D.   On: 10/12/2019 14:31   CT Abdomen Pelvis W Contrast  Result Date: 10/12/2019 CLINICAL DATA:  Abdominal pain EXAM: CT ABDOMEN AND PELVIS WITH CONTRAST TECHNIQUE: Multidetector CT imaging of the abdomen and pelvis was performed using the standard protocol following bolus administration of intravenous contrast. CONTRAST:  189m OMNIPAQUE IOHEXOL 350 MG/ML SOLN COMPARISON:  09/12/2019 FINDINGS: Lower chest: Please see separately dictated CT angiogram of the chest. Hepatobiliary: Hepatic steatosis. Numerous hypodense, rim enhancing lesions throughout the liver parenchyma, not significantly changed compared to recent prior staging CT. No gallstones, gallbladder wall thickening, or biliary dilatation. Pancreas: Unremarkable. No pancreatic ductal dilatation or surrounding inflammatory changes. Spleen: Normal in size without significant abnormality. Adrenals/Urinary Tract: Adrenal glands are unremarkable. Kidneys are normal, without renal calculi, solid lesion, or hydronephrosis. Bladder is unremarkable. Stomach/Bowel: Stomach is within normal limits. Appendix appears normal. The colon is generally decompressed, however there is some suggestion of inflammatory wall thickening, particularly in the transverse colon (series 3, image 46). Vascular/Lymphatic: No significant vascular findings are present. Unchanged enlarged celiac axis lymph node measuring 1.0 x 0.8 cm (series 3, image 26). Reproductive: No mass or other significant abnormality. Other: No abdominal wall hernia or abnormality. No abdominopelvic ascites. Musculoskeletal: Numerous sclerotic osseous lesions, not significantly changed compared to prior examination. IMPRESSION: 1. The colon is generally decompressed, however there is some suggestion of inflammatory wall thickening, particularly in the transverse colon. Findings are suggestive of nonspecific infectious or inflammatory colitis. 2. No significant change  in hepatic and osseous metastatic disease compared to recent staging CT. 3. Hepatic steatosis. 4. Please see separately dictated CT examination of the chest. Electronically Signed   By: AEddie CandleM.D.   On: 10/12/2019 14:20   VAS [place]OWER EXTREMITY VENOUS (DVT)  Result Date: 10/13/2019  Lower Venous DVTStudy Indications: Swelling.  Anticoagulation: Eliquis. Limitations: Unable to tolerate compression- left leg. Comparison Study: 06/04/19 previous Performing Technologist: MAbram SanderRVS  Examination Guidelines: A complete evaluation includes B-mode imaging, spectral Doppler, color Doppler, and power Doppler as needed of all accessible portions of each vessel. Bilateral testing is considered an integral part of a complete examination. Limited examinations for reoccurring indications may be performed as noted. The reflux portion of the exam is performed with the patient in reverse Trendelenburg.  +---------+---------------+---------+-----------+----------+--------------+ RIGHT    CompressibilityPhasicitySpontaneityPropertiesThrombus Aging +---------+---------------+---------+-----------+----------+--------------+ CFV      Full           Yes      Yes                                 +---------+---------------+---------+-----------+----------+--------------+ SFJ      Full                                                        +---------+---------------+---------+-----------+----------+--------------+ FV Prox  Full                                                        +---------+---------------+---------+-----------+----------+--------------+  FV Mid   Full                                                        +---------+---------------+---------+-----------+----------+--------------+ FV DistalFull                                                        +---------+---------------+---------+-----------+----------+--------------+ PFV      Full                                                         +---------+---------------+---------+-----------+----------+--------------+ POP      Full           Yes      Yes                                 +---------+---------------+---------+-----------+----------+--------------+ PTV      Full                                                        +---------+---------------+---------+-----------+----------+--------------+ PERO                                                  Not visualized +---------+---------------+---------+-----------+----------+--------------+   +---------+---------------+---------+-----------+----------+--------------+ LEFT     CompressibilityPhasicitySpontaneityPropertiesThrombus Aging +---------+---------------+---------+-----------+----------+--------------+ CFV      Full           Yes      Yes                                 +---------+---------------+---------+-----------+----------+--------------+ SFJ      Full                                                        +---------+---------------+---------+-----------+----------+--------------+ FV Prox                 Yes      Yes                                 +---------+---------------+---------+-----------+----------+--------------+ FV Mid                  Yes      Yes                                 +---------+---------------+---------+-----------+----------+--------------+  FV Distal               Yes      Yes                                 +---------+---------------+---------+-----------+----------+--------------+ PFV                     Yes      Yes                                 +---------+---------------+---------+-----------+----------+--------------+ POP      Full           Yes      Yes                                 +---------+---------------+---------+-----------+----------+--------------+ PTV      Full                                                         +---------+---------------+---------+-----------+----------+--------------+ PERO                                                  Not visualized +---------+---------------+---------+-----------+----------+--------------+     Summary: BILATERAL: - No evidence of deep vein thrombosis seen in the lower extremities, bilaterally. - No evidence of superficial venous thrombosis in the lower extremities, bilaterally. -  LEFT: - Portions of this examination were limited- see technologist comments above.  *See table(s) above for measurements and observations. Electronically signed by Servando Snare MD on 10/13/2019 at 3:39:36 PM.    Final     ASSESSMENT & PLAN William Ali 34 y.o. male with medical history significant for metastatic adenocarcinoma of the lung who presents to the ED and was admitted for intractable pain.  At this time the 2 primary concerns for this patient are the colitis and the considerable worsening of his pain.  Regards to the patient's colitis the CT imaging does show evidence of inflammation in the transverse colon, though these findings are nonconclusive on CT scan.  He does have baseline abdominal pain, though what he is experiencing right now appears more severe.  Diarrhea is not something he has at his baseline nor is the nausea and vomiting.  I am agreement right now with empiric antibiotics with Zosyn, however in the event the patient's symptoms worsen or the colitis fails to get under control we would need to consider an immune colitis as a possible etiology.  There are several reasons why this would not likely represent an immune colitis including the fact that he has been on therapy for 13 weeks (that normally occurs around the 6-week mark of therapy).  The CT scan findings at this time are relatively mild and therefore I do not think we should pursue an alternative etiology at this time.  Additionally his infectious work-up is currently pending.  In regards the patient's pain  control we recently made alterations to his pain medication regimen.  We  dropped his fentanyl patch from 100 mcg down to 75 mcg due to involuntary muscle spasms the patient was having.  Unfortunately in the transition between these 2 dosages the patient had 2 days where he was without his fentanyl patch and I do believe that that is what is causing his current flare in pain.  The imaging that he is obtained the emergency department does not show any clear evidence of progression of disease, though the primary team will be ordering MRI in order to assure that there is no worsening.  Overall I am appreciative for the assistance of palliative care in management of his symptoms at this time.  In regards to overall prognosis the patient currently has stable disease after 6 cycles of chemotherapy.  He also has a targetable mutation which would make him a candidate for additional therapy once he begins to progress.  Given this information I would strongly recommend against referral to hospice at this time as he is quite a young age and we still have therapy to offer.  #Colitis --possible inflammation noted in transverse colon, though findings are not conclusive on CT scan --patient has baseline abdominal pain, his current pain seems more severe, but no different in character.  --agree with empiric Zosyn therapy and stool sample for culture/C. Diff --hold on anti-motility agents, as the patient is only opioid therapy and may develop severe constipation.  --if this colitis fails to resolve with hydration and adequate antibiotic therapy may need to consider an immune colitis from his pembrolizumab, though I would consider this less likely at this time (discussion above).  --continue to monitor and hydrate per primary team.   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   -- Guardant 360 with actionable EGFR mutation noted. Anti-EGFR therapy would be his next treatment course in the event of chemo failure.  --currently on Carbo/Pem/Pem, Cycle 1 Day 1 on 06/23/2019.  --repeat CT scan on 09/12/2019 showed interval response to therapy. Scans in the ED show stable disease.  --I do NOT recommend referral to hospice at this time. The patient is young and tolerating therapy well. He also has a targetable mutation and next line of therapy available in the event of progression.  --I am agreeable to palliative care conversation for the purposes of pain control/symptom management  --Oncology will continue to follow while in house.   #Facial Swelling, stable --bilateral facial swelling, most pronounced in the neck bilaterally. Some lid edema and facial fullness with no marked increase in arm swelling --no worsening abdominal or LE swelling. No difficultly breathing or swallowing.  --on exam today the cervical regions bilaterally felt stable.  --imaging shows no evidence of lymphadenopathy. Only soft tissue swelling, possibly gravity dependant as he spends most of his day laying down.   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 and radiation oncology are following, Appreciate assistance in management of brain mets.  --pain control as noted below.  #Pain Control, worsening --pain is predominately in the back and abdomen. Of note, headaches had resolved but are now return. May be 2/2 to decreasing steroid dosages.  --decreased fentanyl patches to 75 mcg q 72H due to worsening muscle spasms at last clinic visit. Unfortunately there was a gap in his pain control where he did not have a patch x  2 days.  --dilaudid PO 4-34m q4H PRN (usage increasing) --additionally is taking cymablata 33mPO and celebrex 20015maily  --senna docusate/PRN miralax for opioid induced  constipation --appreciate primary teams assistance in controlling his pain.   #Pulmonary Embolism #Elevated INR in Setting of DOAC use --continue Xarelto 87m38m daily  --INR is of little utility in the setting of DOAC use. Monitor for clinical signs of bleeding, otherwise INR would not alter our treatment decisions.  --No need for monitoring of INR  All questions were answered. The patient knows to call the clinic with any problems, questions or concerns.  A total of more than 30 minutes were spent on this encounter and over half of that time was spent on counseling and coordination of care as outlined above.   JohnLedell Peoples Department [facility] Hematology[facility]cology [facility]: [phone]: [phone]: [email or web address]  10/13/2019 4:47 PM

## 2019-10-13 NOTE — ED Notes (Signed)
Assumed care of patient at this time, nad noted, sr up x2, bed locked and low, call bell w/I reach.  Will continue to monitor. ° °

## 2019-10-13 NOTE — Progress Notes (Signed)
Pt c/o of dry eyes, lubricant drops ordered per standing orders protocol. SRP, RN

## 2019-10-13 NOTE — ED Notes (Signed)
Vascular at bedside

## 2019-10-13 NOTE — Progress Notes (Signed)
Lower extremity venous has been completed.   Preliminary results in CV Proc.   Abram Sander 10/13/2019 3:31 PM

## 2019-10-13 NOTE — Progress Notes (Signed)
Called floor multiple times about MRI  without an answer. Will try again tomorrow.

## 2019-10-13 NOTE — Progress Notes (Signed)
Pharmacy Antibiotic Note  William Ali is a 34 y.o. male admitted on 10/12/2019 with worsening chest, abd & back pain.  Pharmacy has been consulted for Zosyn dosing for intra-abdominal infection.   Today, 10/13/19  WBC WNL  Lactate 2.1  SCR 1.1, CrCl > 60 mL/min  Afebrile  This is day #2 of IV antibiotics.  Plan:  Piperacillin/tazobactam 3.375 g IV q8h EI  Renal function WNL, will sign off. Please re-consult if needed.    Height: 5\' 7"  (170.2 cm) Weight: 95.3 kg (210 lb) IBW/kg (Calculated) : 66.1  Temp (24hrs), Avg:98.5 F (36.9 C), Min:98.4 F (36.9 C), Max:98.5 F (36.9 C)  Recent Labs  Lab 10/09/19 1133 10/12/19 1147 10/12/19 1148 10/12/19 1149 10/12/19 1255  WBC 3.1* 6.1  --   --   --   CREATININE 1.08  --   --  1.16  --   LATICACIDVEN  --   --  4.0*  --  2.1*    Estimated Creatinine Clearance: 97.8 mL/min (by C-G formula based on SCr of 1.16 mg/dL).    Allergies  Allergen Reactions  . Onion Anaphylaxis  . Other     All sea food causes swelling and a rash.    . Peanut Oil Itching  . Peanut-Containing Drug Products Itching    Antimicrobials this admission: 9/16 Vanc & Cefepime x1 ordered in ED 9/16 Zosyn >>   Dose adjustments this admission:  Microbiology results: 9/16 covid: negative 9/16 MRSA PCR: ordered 9/16 BCx2:  ngtd  Thank you for allowing pharmacy to be a part of this patient's care.  Darylene Price [facility] PharmD 10/13/2019 9:51 AM

## 2019-10-13 NOTE — Progress Notes (Addendum)
PROGRESS NOTE  William Ali  DOB: Jun 16, 1985  PCP: Elsie Stain, MD ZOX:[id number]  DOA: 10/12/2019  LOS: 1 day   Chief Complaint  Patient presents with   Back Pain   Chest Pain   Leg Pain    Brief narrative: William ZAIAH ECKERSON is a 34 y.o. 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 Patient presented to the ED on 9/16 with 2 to 3 days history of worsening abdominal pain, left leg pain and generalized weakness after his last chemotherapy on 9/13.  He stated that he ran out of his fentanyl about 2 days ago.   In the ED, patient was tachycardic to 170s, hypertensive to 174/90, tolerating room air. Labs showed potassium low at 3.2, albumin low at 2.7, AST ALT elevated, WBC count normal at 6.1, hemoglobin low at 8.1 with macrocytosis, platelets low at 124, INR elevated to 6.9 Covid PCR negative.   CT chest was negative for PE. CT abdomen pelvis showed some suggestion of inflammatory wall thickening in the transverse colon, suggestive of nonspecific infectious or inflammatory colitis.  In the ED, patient was given IV fluid, IV vancomycin and IV cefepime Admitted to hospitalist service for further evaluation management.  Subjective: Patient was seen and examined this afternoon.  Patient was still in the ED waiting for inpatient bed availability. Chart reviewed. No fever.  Heart rate mostly between 110 and 130 bpm, blood pressure in 140s and 150s Repeat INR this morning is down to 2.7.  Assessment/Plan: Severe sepsis - POA Acute transverse colitis -Presented with abdominal pain, generalized weakness. -Met sepsis criteria with Tachycardic, tachypneic, lactic acid 4.0 -CT abdomen/pelvis concerning for possible transverse colon infectious versus inflammatory colitis -Blood cultures were sent.  Started on broad-spectrum antibiotics, IV hydration. -Continue IV Zosyn and IV  vancomycin. -Blood pressure stable.  Lactic acid improving.  Continue IV fluid at a lower rate of 50 mill per hour. -Last bowel movement was Wednesday night, prior to presentation -Tachycardia gradually improving but is still over 100.  Continue to monitor. -Currently on clear liquid diet.  No vomiting.  Advance to regular diet today. Recent Labs  Lab 10/09/19 1133 10/12/19 1147 10/12/19 1148 10/12/19 1255 10/13/19 1115  WBC 3.1* 6.1  --   --  4.0  LATICACIDVEN  --   --  4.0* 2.1* 1.8   Left leg pain History of left leg DVT  -May 2021, patient had left leg DVT. Denies any h/o PE. He has been on Xarelto since then.  Reports good compliance.  Because of worsening left leg pain, patient is concerned about recurrence of DVT.  Ultrasound duplex ordered.   -Denies history of pulmonary in the past.  CTPA negative for pulmonary embolism. -INR elevated to 6.9 at presentation. Per admitting physician, this was discussed with patient's oncologist who stated that elevated INR is likely because of lateral and would not change management.   -Continue Xarelto.   -Continue Protonix 40 mg daily.  Acute on chronic cancer related back pain -On my examination today, patient has significant tenderness at all levels of spine.  Denies recent fall.   -MRI thoracolumbar spine in May had shown compression fractures. -Discussed with oncologist Dr. Lorenso Courier.  Repeat MRI thoracic and lumbar spine ordered. -On fentanyl patch and oral Dilaudid at home.   -IV Dilaudid as needed in the hospital for acute pain -Bowel regimen with Senokot daily and MiraLAX as needed.  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 Lorenso Courier. most recent cycle of chemotherapy on 9/13 (Pemetrexed/Pembrolizumab (held Carboplatin due to cytopenias).  Essential hypertension -On Toprol 50 mg daily at home.  Continue the same.  Continue monitor blood pressure.  IV hydralazine as  needed.  Hypokalemia -Pending labs this morning. Recent Labs  Lab 10/09/19 1133 10/12/19 1149 10/13/19 1115  K 3.6 3.2* 3.5  MG  --   --  1.2*  PHOS  --   --  2.8   Anxiety/depression -Continue Cymbalta/as needed Ativan  Mobility: Severe tenderness along the vertebrae.  Limited mobility Code Status:   Code Status: Full Code  Nutritional status: Body mass index is 32.89 kg/m.     Diet Order            Diet regular Room service appropriate? Yes; Fluid consistency: Thin  Diet effective now                 DVT prophylaxis:  rivaroxaban (XARELTO) tablet 20 mg   Antimicrobials:  IV Zosyn and IV vancomycin Fluid: LR rate lowered to 50 mill per hour Consultants: Oncology Family Communication:  Not at bedside  Status is: Inpatient  Remains inpatient appropriate because:Ongoing active pain requiring inpatient pain management, Ongoing diagnostic testing needed not appropriate for outpatient work up and IV treatments appropriate due to intensity of illness or inability to take PO   Dispo: The patient is from: Home              Anticipated d/c is to: Home              Anticipated d/c date is: 3 days              Patient currently is not medically stable to d/c.       Infusions:   lactated ringers Stopped (10/13/19 1105)   magnesium sulfate bolus IVPB     piperacillin-tazobactam (ZOSYN)  IV 3.375 g (10/13/19 0829)    Scheduled Meds:  DULoxetine  30 mg Oral Daily   fentaNYL  1 patch Transdermal X41O   folic acid  1 mg Oral Daily   lidocaine  1 patch Transdermal Q24H   metoprolol succinate  50 mg Oral Daily   rivaroxaban  20 mg Oral Q supper   senna  1 tablet Oral QHS   sodium chloride flush  3 mL Intravenous Q12H    Antimicrobials: Anti-infectives (From admission, onward)   Start     Dose/Rate Route Frequency Ordered Stop   10/12/19 2359  piperacillin-tazobactam (ZOSYN) IVPB 3.375 g        3.375 g 12.5 mL/hr over 240 Minutes Intravenous Every 8  hours 10/12/19 1627     10/12/19 1515  vancomycin (VANCOREADY) IVPB 2000 mg/400 mL        2,000 mg 200 mL/hr over 120 Minutes Intravenous  Once 10/12/19 1452 10/12/19 1823   10/12/19 1500  ceFEPIme (MAXIPIME) 2 g in sodium chloride 0.9 % 100 mL IVPB        2 g 200 mL/hr over 30 Minutes Intravenous  Once 10/12/19 1449 10/12/19 1651      PRN meds: acetaminophen **OR** acetaminophen, HYDROmorphone (DILAUDID) injection, HYDROmorphone, labetalol, ondansetron, pantoprazole, polyethylene glycol   Objective: Vitals:   10/13/19 1431 10/13/19 1611  BP: 103/76 135/72  Pulse: (!) 103 (!) 113  Resp: (!) 29 (!) 21  Temp:  97.9 F (36.6 C)  SpO2: 100% 100%    Intake/Output Summary (Last  24 hours) at 10/13/2019 1618 Last data filed at 10/13/2019 0908 Gross per 24 hour  Intake 2498.86 ml  Output --  Net 2498.86 ml   Filed Weights   10/12/19 1541  Weight: 95.3 kg   Weight change:  Body mass index is 32.89 kg/m.   Physical Exam: General exam: Young unfortunate male.  Mild to moderate distress because of severe back pain Skin: No rashes, lesions or ulcers. HEENT: Atraumatic, normocephalic, supple neck, no obvious bleeding Lungs: Clear to auscultation bilaterally CVS: Regular rate and rhythm, no murmur GI/Abd soft, minimal diffuse tenderness, bowel sound present CNS: Alert, awake, oriented x3 Back-tenderness all along the thoracic and lumbar vertebrae. Psychiatry: Depressed look Extremities: No pedal edema, no calf tenderness  Data Review: I have personally reviewed the laboratory data and studies available.  Recent Labs  Lab 10/09/19 1133 10/12/19 1147 10/13/19 1115  WBC 3.1* 6.1 4.0  NEUTROABS 2.0  --  3.7  HGB 8.7* 8.1* 7.4*  HCT 29.5* 27.8* 24.2*  MCV 100.7* 100.4* 99.2  PLT 146* 124* 124*   Recent Labs  Lab 10/09/19 1133 10/12/19 1149 10/13/19 1115  NA 145 140 141  K 3.6 3.2* 3.5  CL 109 107 103  CO2 24 20* 24  GLUCOSE 139* 170* 147*  BUN _0 CREATININE  1.08 1.16 1.13  CALCIUM 8.5* 7.1* 7.5*  MG  --   --  1.2*  PHOS  --   --  2.8    F/u labs ordered  Signed, Terrilee Croak, MD [facility] 10/13/2019

## 2019-10-14 ENCOUNTER — Inpatient Hospital Stay (HOSPITAL_COMMUNITY): Payer: PRIVATE HEALTH INSURANCE

## 2019-10-14 DIAGNOSIS — Z7189 Other specified counseling: Secondary | ICD-10-CM

## 2019-10-14 DIAGNOSIS — R000 Tachycardia, unspecified: Secondary | ICD-10-CM

## 2019-10-14 DIAGNOSIS — R9431 Abnormal electrocardiogram [ECG] [EKG]: Secondary | ICD-10-CM

## 2019-10-14 DIAGNOSIS — G893 Neoplasm related pain (acute) (chronic): Secondary | ICD-10-CM | POA: Diagnosis present

## 2019-10-14 LAB — CBC WITH DIFFERENTIAL/PLATELET
Abs Immature Granulocytes: 0.04 10*3/uL (ref 0.00–0.07)
Basophils Absolute: 0 10*3/uL (ref 0.0–0.1)
Basophils Relative: 0 %
Eosinophils Absolute: 0 10*3/uL (ref 0.0–0.5)
Eosinophils Relative: 1 %
HCT: 25.4 % — ABNORMAL LOW (ref 39.0–52.0)
Hemoglobin: 7.4 g/dL — ABNORMAL LOW (ref 13.0–17.0)
Immature Granulocytes: 2 %
Lymphocytes Relative: 3 %
Lymphs Abs: 0.1 10*3/uL — ABNORMAL LOW (ref 0.7–4.0)
MCH: 28.9 pg (ref 26.0–34.0)
MCHC: 29.1 g/dL — ABNORMAL LOW (ref 30.0–36.0)
MCV: 99.2 fL (ref 80.0–100.0)
Monocytes Absolute: 0.3 10*3/uL (ref 0.1–1.0)
Monocytes Relative: 11 %
Neutro Abs: 2 10*3/uL (ref 1.7–7.7)
Neutrophils Relative %: 83 %
Platelets: 103 10*3/uL — ABNORMAL LOW (ref 150–400)
RBC: 2.56 MIL/uL — ABNORMAL LOW (ref 4.22–5.81)
RDW: 16.6 % — ABNORMAL HIGH (ref 11.5–15.5)
WBC: 2.4 10*3/uL — ABNORMAL LOW (ref 4.0–10.5)
nRBC: 0 % (ref 0.0–0.2)

## 2019-10-14 LAB — GASTROINTESTINAL PANEL BY PCR, STOOL (REPLACES STOOL CULTURE)

## 2019-10-14 LAB — COMPREHENSIVE METABOLIC PANEL
ALT: 105 U/L — ABNORMAL HIGH (ref 0–44)
AST: 109 U/L — ABNORMAL HIGH (ref 15–41)
Albumin: 2.6 g/dL — ABNORMAL LOW (ref 3.5–5.0)
Alkaline Phosphatase: 359 U/L — ABNORMAL HIGH (ref 38–126)
Anion gap: 11 (ref 5–15)
BUN: 8 mg/dL (ref 6–20)
CO2: 25 mmol/L (ref 22–32)
Calcium: 7.9 mg/dL — ABNORMAL LOW (ref 8.9–10.3)
Chloride: 104 mmol/L (ref 98–111)
Creatinine, Ser: 0.95 mg/dL (ref 0.61–1.24)
GFR calc Af Amer: >60 mL/min (ref >60)
GFR calc non Af Amer: >60 mL/min (ref >60)
Glucose, Bld: 106 mg/dL — ABNORMAL HIGH (ref 70–99)
Potassium: 3.8 mmol/L (ref 3.5–5.1)
Sodium: 140 mmol/L (ref 135–145)
Total Bilirubin: 1.3 mg/dL — ABNORMAL HIGH (ref 0.3–1.2)
Total Protein: 5.7 g/dL — ABNORMAL LOW (ref 6.5–8.1)

## 2019-10-14 LAB — MRSA PCR SCREENING: MRSA by PCR: NEGATIVE

## 2019-10-14 IMAGING — MR MR THORACIC SPINE W/O CM
4 of 7 series · 19 of 48 positions shown · non-contrast
Comparison: Thoracic MRI [DATE].

CLINICAL DATA: 34-year-old male with metastatic lung cancer.
History of T3 pathologic fracture and multilevel thoracic ventral
epidural tumor [REDACTED]. Back pain.

EXAM:
MRI THORACIC SPINE WITHOUT CONTRAST
TECHNIQUE: Multiplanar, multisequence MR imaging of the thoracic spine was
performed. No intravenous contrast was administered.

[Series 17: STIR · sagittal · 3.0mm · 1.00mm/px · 3 of 17 slices shown]
[im 1/17]
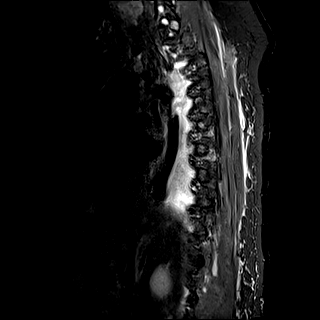
[im 9/17]
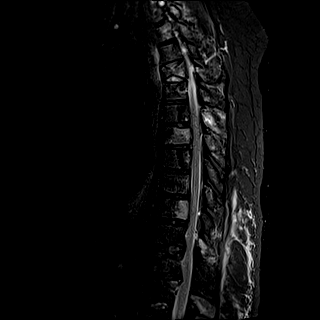
[im 17/17]
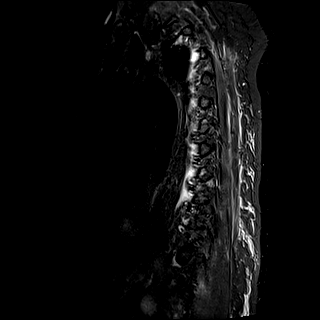

[Series 18: T1 · sagittal · 3.0mm · 1.25mm/px · 3 of 17 slices shown]
[im 1/17]
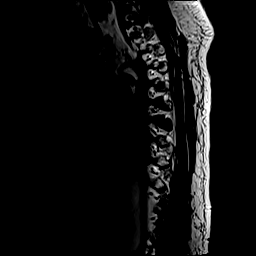
[im 9/17]
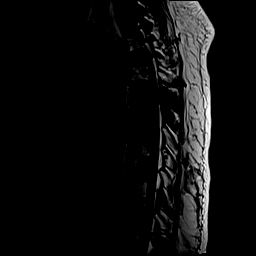
[im 17/17]
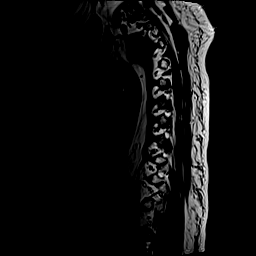

[Series 19: T2 · sagittal · 3.0mm · 1.00mm/px · 3 of 15 slices shown (1 of 2)]
[im 1/15]
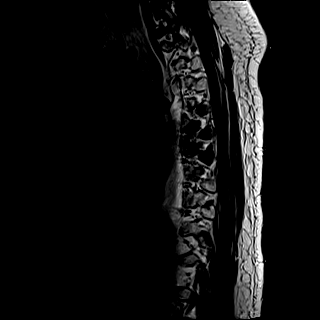
[im 8/15]
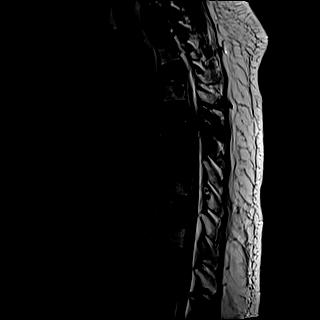
[im 15/15]
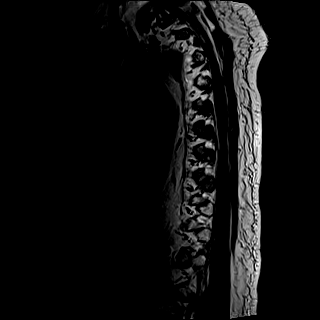

[Series 24: T2 · axial · 4.0mm · 0.78mm/px · z∈[-311,-65]mm · 10 of 51 slices shown (2 of 2)]
[im 1/51]
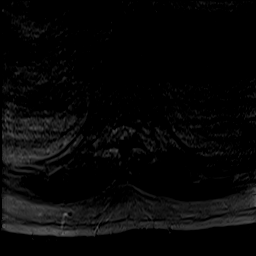
[im 6/51]
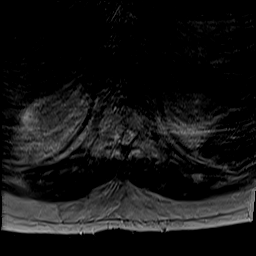
[im 12/51]
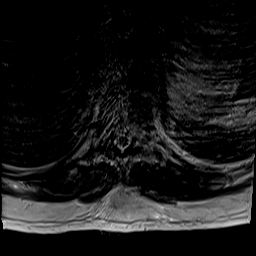
[im 17/51]
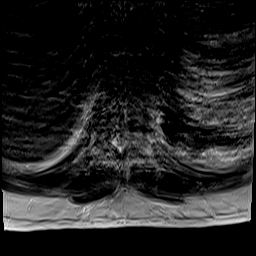
[im 23/51]
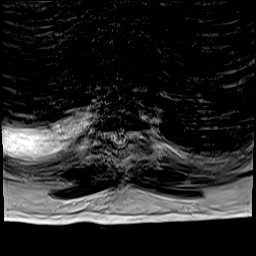
[im 28/51]
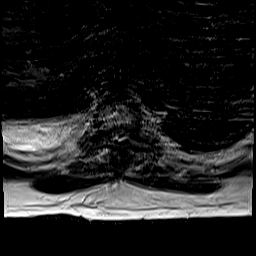
[im 34/51]
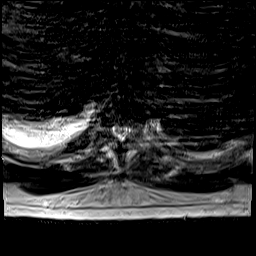
[im 39/51]
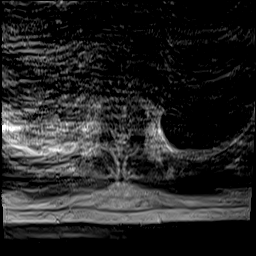
[im 45/51]
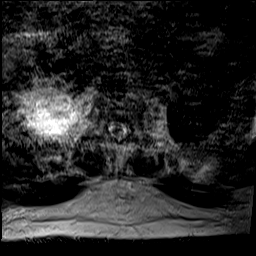
[im 51/51]
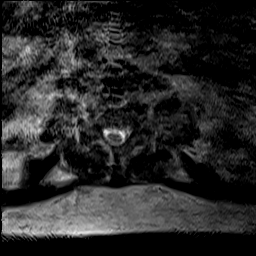

[19 of 48 positions shown; findings below may reference images not displayed]

FINDINGS: The examination had to be discontinued prior to completion due to
pain. Only one set of motion degraded axial T2 weighted images could
be obtained.

Limited cervical spine imaging: Subtotal involvement of the C7
vertebra by tumor. Preserved cervicothoracic junction alignment.

Thoracic spine segmentation: Appears to be normal, same numbering
system used [REDACTED].

Alignment:  Stable [REDACTED].

Vertebrae:

Diffuse thoracic bone metastases, with subtotal to complete
vertebral infiltration redemonstrated at T1, T3, T6. Multilevel
posterior element involvement, worst at T5 as before.

Stable T3 vertebral body pathologic compression fracture. No new
pathologic fracture identified in the thoracic spine. No areas of
progressed marrow edema [REDACTED].

Cord: No thoracic spinal cord edema has developed [REDACTED]. Stable
noncontrast appearance of the cord. Conus medullaris occurs below
T12-L1.

Paraspinal and other soft tissues: Small right pleural effusion has
increased. Otherwise grossly stable visible thoracic and upper
abdominal viscera.

Thoracic paraspinal soft tissues have not significantly changed,
including some bilateral erector spinae muscle edema related to
posterior element tumor involvement.

Disc levels:

Motion degraded axial images today such that epidural extension of
tumor and mild spinal stenosis can only be discerned from sagittal
sequences. Within that limitation, the epidural tumor extension and
mild spinal stenosis at T3, T5, T6, T7, and T9 appears stable [REDACTED]. New mild ventral epidural tumor is suspected at T11 now, with
new mild spinal stenosis there.

Epidural tumor involving the bilateral T5 neural foramina has
increased, but is stable at the right T6 neural foramen.
IMPRESSION: 1. Largely stable diffuse thoracic spine metastases since the
noncontrast MRI in [DATE]. New mild epidural tumor and mild spinal stenosis at the T11
level.
Increased epidural tumor in the bilateral T5 neural foramina, stable
at the right T6 foramen.
Stable epidural extension and mild spinal stenosis at T3, T5, T6,
T7, and T9. No associated spinal cord edema.

3. Stable T3 pathologic fracture.  No new pathologic fracture.

4. Layering right pleural effusion appears increased.

## 2019-10-14 IMAGING — MR MR LUMBAR SPINE W/O CM
5 series · 33 of 48 positions shown · non-contrast
Comparison: Thoracic spine MRI today reported separately. Lumbar
MRI [DATE].

CLINICAL DATA: 34-year-old male with metastatic lung cancer.
History of T3 pathologic fracture and multilevel thoracic ventral
epidural tumor [REDACTED]. Back pain.

EXAM:
MRI LUMBAR SPINE WITHOUT CONTRAST
TECHNIQUE: Multiplanar, multisequence MR imaging of the lumbar spine was
performed. No intravenous contrast was administered.

[Series 16: T1 · sagittal · 4.0mm · 1.02mm/px · 6 of 17 slices shown (1 of 2)]
[im 1/17]
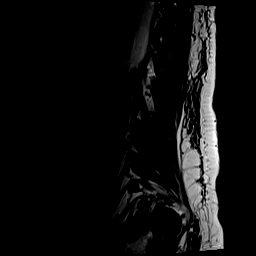
[im 4/17]
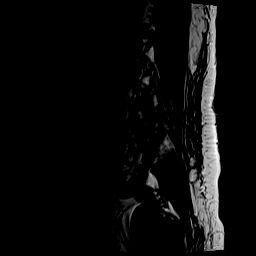
[im 7/17]
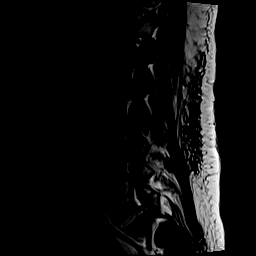
[im 10/17]
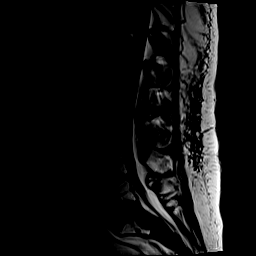
[im 13/17]
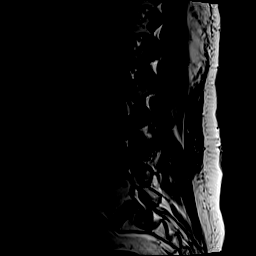
[im 17/17]
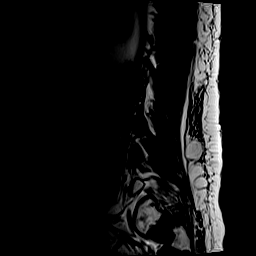

[Series 17: T2 · sagittal · 4.0mm · 1.02mm/px · 6 of 17 slices shown (1 of 2)]
[im 1/17]
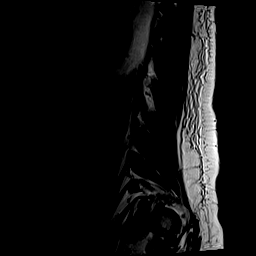
[im 4/17]
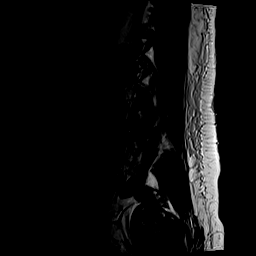
[im 7/17]
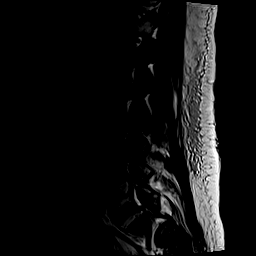
[im 10/17]
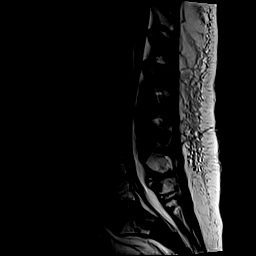
[im 13/17]
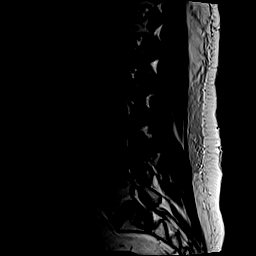
[im 17/17]
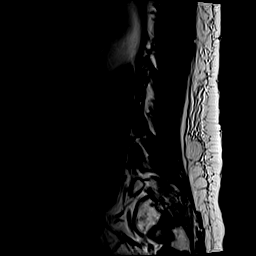

[Series 18: STIR · sagittal · 4.0mm · 0.51mm/px · 3 of 17 slices shown]
[im 1/17]
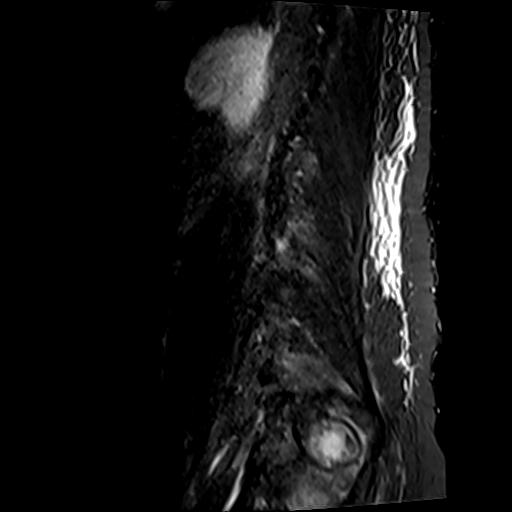
[im 4/17]
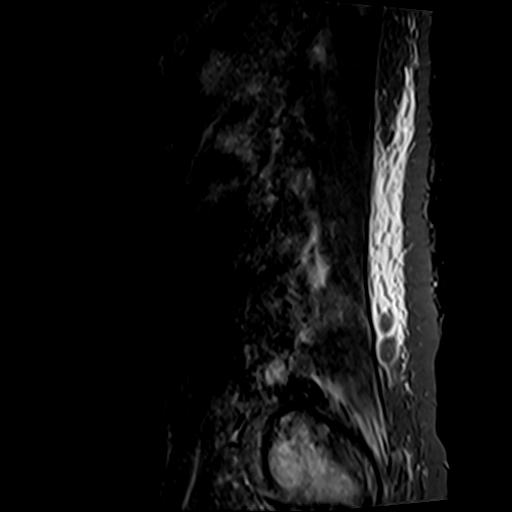
[im 7/17]
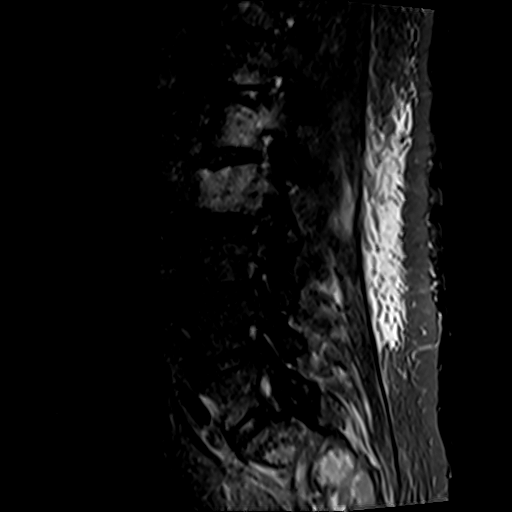

[Series 19: T2 · axial · 4.0mm · 0.78mm/px · z∈[-544,-317]mm · 9 of 43 slices shown (2 of 2)]
[im 1/43]
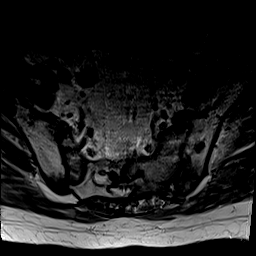
[im 7/43]
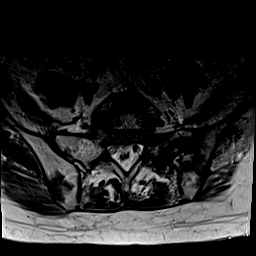
[im 13/43]
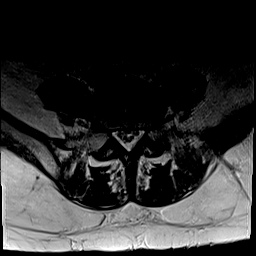
[im 19/43]
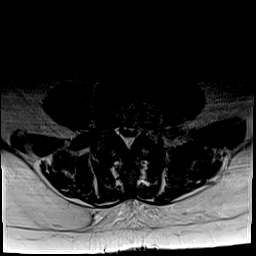
[im 22/43]
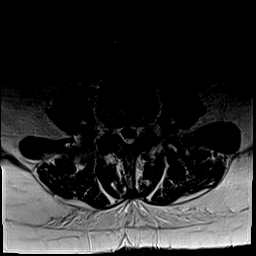
[im 25/43]
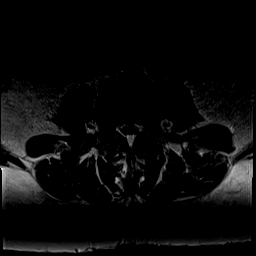
[im 31/43]
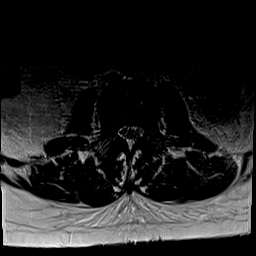
[im 37/43]
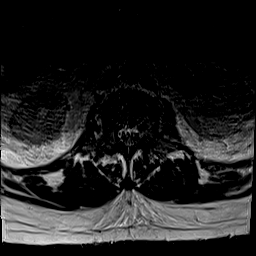
[im 43/43]
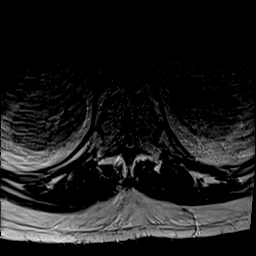

[Series 20: T1 · axial · 4.0mm · 0.39mm/px · z∈[-544,-317]mm · 9 of 43 slices shown (2 of 2)]
[im 1/43]
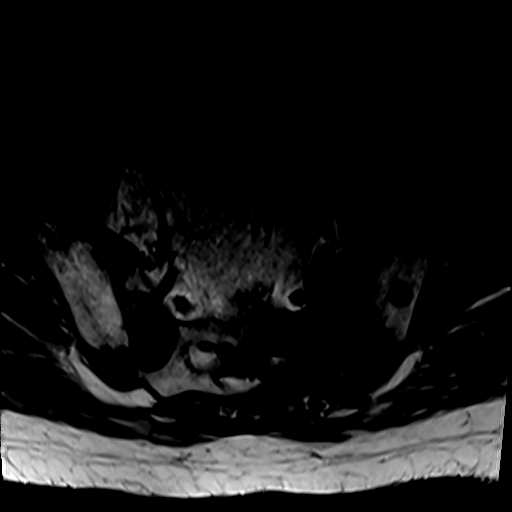
[im 7/43]
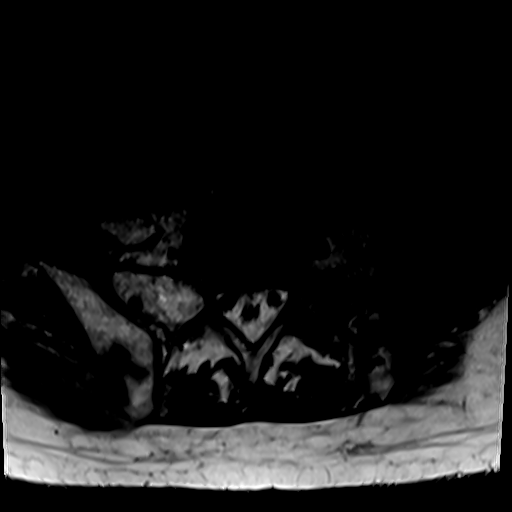
[im 13/43]
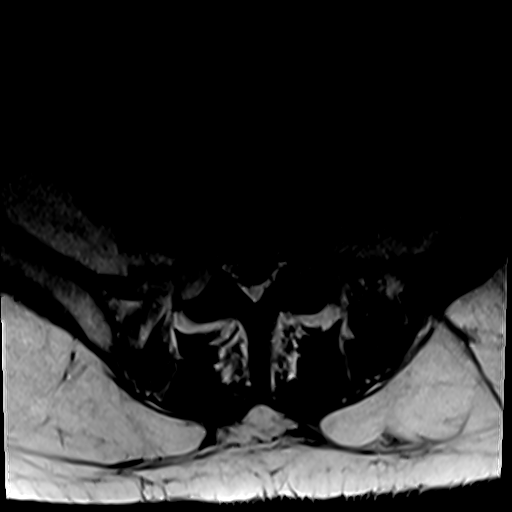
[im 19/43]
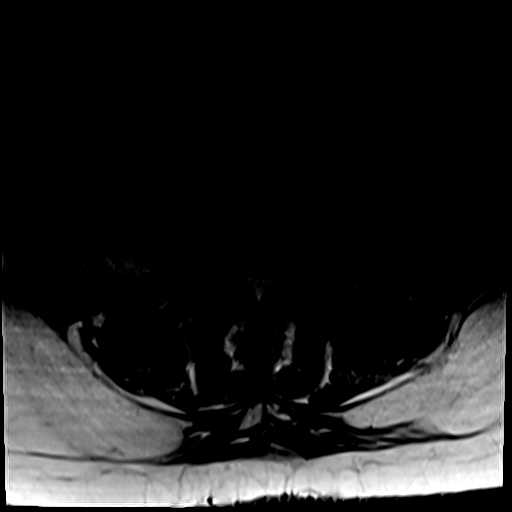
[im 22/43]
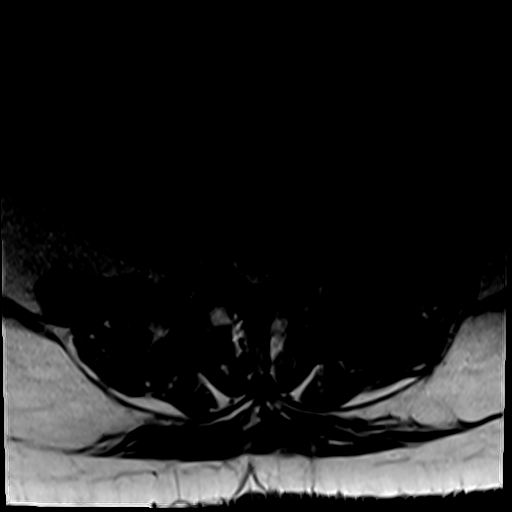
[im 25/43]
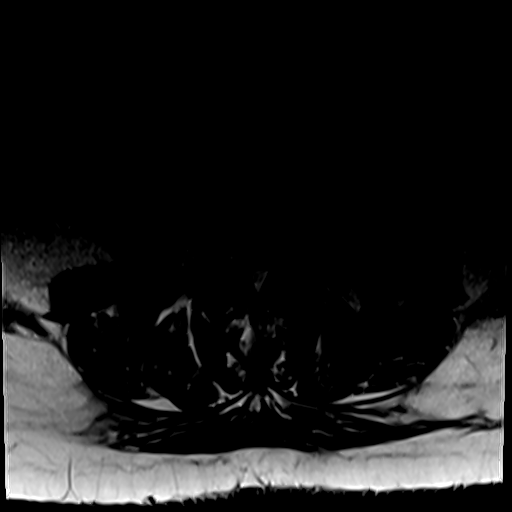
[im 31/43]
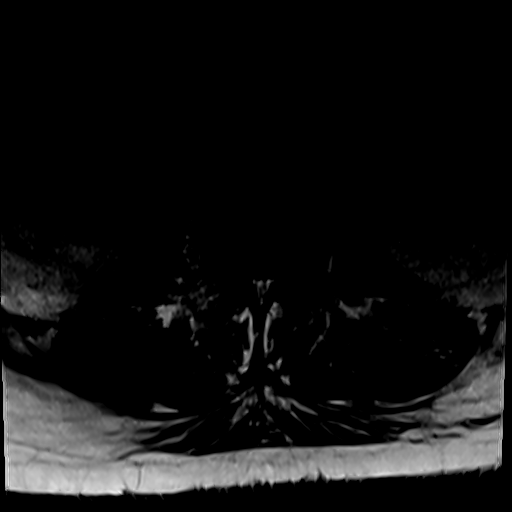
[im 37/43]
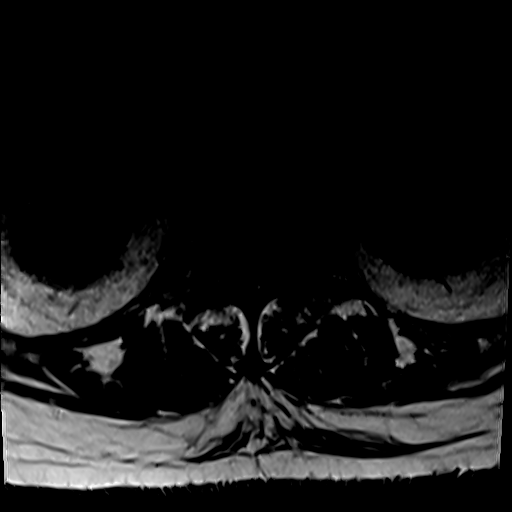
[im 43/43]
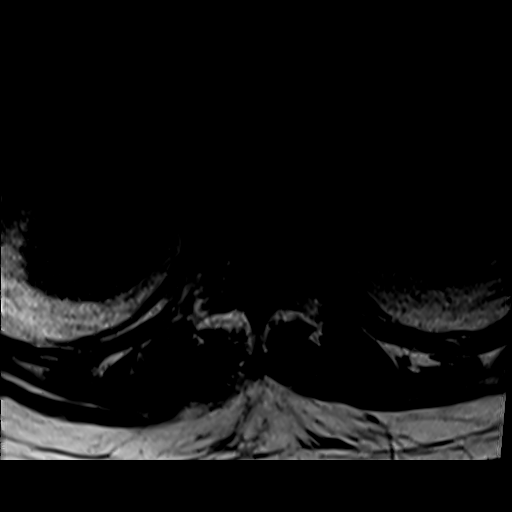

[33 of 48 positions shown; findings below may reference images not displayed]

FINDINGS: Segmentation: Normal, concordant with the thoracic spine numbering
today.

Alignment:  Stable [REDACTED].

Vertebrae: Diffuse lumbar vertebral metastatic disease again noted.
Near complete tumor infiltration of the L2 vertebra as before.
Increased L3 vertebral body tumor [REDACTED]. Extensive left L1 and
right L4 vertebral tumor appears stable. L5 involvement appears
stable. Diffuse tumor infiltration of the left sacral ala and
central lower sacral segments appears stable.

Mild pathologic fracture of the L2 superior endplate is stable. No
new pathologic fracture.

Conus medullaris and cauda equina: Conus extends to the L1 level. No
signal abnormality in the visible lower thoracic cord or conus.

Paraspinal and other soft tissues: Stable [REDACTED]. Negative
visible abdominal viscera. Mild lumbar paraspinal muscle edema
related to tumor.

Disc levels:

New ventral epidural tumor at both L1 and L2, with mild new spinal
stenosis at both levels. No foraminal tumor at this time.

Possible early right ventral epidural tumor now at L3, although new
spinal stenosis there appears more related to increased epidural
lipomatosis.

Mildly increased epidural lipomatosis and associated spinal stenosis
also at L4 and L5.

Superimposed lumbar spine degeneration, maximal at L3-L4, is stable.
IMPRESSION: 1. Diffuse lumbosacral metastatic disease re-demonstrated. Stable
mild L2 pathologic fracture. No new pathologic fracture.

2. Increased tumor infiltration of the L3 verte[REDACTED]. Mild
new ventral epidural tumor at L1, L2, and probably also L3. New mild
spinal stenosis at those levels.

3. Increased lumbar spinal stenosis elsewhere related to interval
increased epidural lipomatosis.

## 2019-10-14 MED ORDER — LORAZEPAM 2 MG/ML IJ SOLN
INTRAMUSCULAR | Status: AC
  Administered 2019-10-14: 1 mg
  Filled 2019-10-14: qty 1

## 2019-10-14 MED ORDER — DULOXETINE HCL 60 MG PO CPEP
60.0000 mg | ORAL_CAPSULE | Freq: Every day | ORAL | Status: DC
  Administered 2019-10-15 – 2019-10-28 (×14): 60 mg via ORAL
  Filled 2019-10-14 (×14): qty 1

## 2019-10-14 MED ORDER — KETOROLAC TROMETHAMINE 15 MG/ML IJ SOLN
15.0000 mg | Freq: Once | INTRAMUSCULAR | Status: AC
  Administered 2019-10-14: 15 mg via INTRAVENOUS
  Filled 2019-10-14: qty 1

## 2019-10-14 MED ORDER — DEXAMETHASONE 2 MG PO TABS
1.0000 mg | ORAL_TABLET | Freq: Every day | ORAL | Status: DC
  Administered 2019-10-14 – 2019-10-15 (×2): 1 mg via ORAL
  Filled 2019-10-14 (×2): qty 1

## 2019-10-14 MED ORDER — LORAZEPAM 2 MG/ML IJ SOLN: 1.0000 mg | Freq: Once | INTRAMUSCULAR | Status: AC

## 2019-10-14 MED ORDER — SODIUM CHLORIDE 0.9% FLUSH
10.0000 mL | INTRAVENOUS | Status: DC | PRN
  Administered 2019-10-25 – 2019-10-28 (×2): 10 mL

## 2019-10-14 MED ORDER — VANCOMYCIN 50 MG/ML ORAL SOLUTION
125.0000 mg | Freq: Four times a day (QID) | ORAL | Status: AC
  Administered 2019-10-14 – 2019-10-23 (×40): 125 mg via ORAL
  Filled 2019-10-14 (×50): qty 2.5

## 2019-10-14 MED ORDER — FENTANYL CITRATE (PF) 100 MCG/2ML IJ SOLN: 50.0000 ug | INTRAMUSCULAR | Status: DC | PRN

## 2019-10-14 MED ORDER — KETOROLAC TROMETHAMINE 15 MG/ML IJ SOLN
15.0000 mg | Freq: Four times a day (QID) | INTRAMUSCULAR | Status: DC | PRN
  Administered 2019-10-15 – 2019-10-18 (×4): 15 mg via INTRAVENOUS
  Filled 2019-10-14 (×4): qty 1

## 2019-10-14 MED ORDER — SODIUM CHLORIDE 0.9% FLUSH
10.0000 mL | Freq: Two times a day (BID) | INTRAVENOUS | Status: DC
  Administered 2019-10-14 – 2019-10-25 (×8): 10 mL

## 2019-10-14 MED ORDER — MAGNESIUM SULFATE 2 GM/50ML IV SOLN
2.0000 g | Freq: Once | INTRAVENOUS | Status: AC
  Administered 2019-10-14: 2 g via INTRAVENOUS
  Filled 2019-10-14: qty 50

## 2019-10-14 MED ORDER — FENTANYL CITRATE (PF) 100 MCG/2ML IJ SOLN
50.0000 ug | INTRAMUSCULAR | Status: DC | PRN
  Administered 2019-10-14: 50 ug via INTRAVENOUS
  Administered 2019-10-14 – 2019-10-15 (×3): 100 ug via INTRAVENOUS
  Administered 2019-10-16 – 2019-10-17 (×2): 50 ug via INTRAVENOUS
  Administered 2019-10-18 (×2): 100 ug via INTRAVENOUS
  Filled 2019-10-14 (×9): qty 2

## 2019-10-14 NOTE — Progress Notes (Signed)
Pt remains in Yellow MEWS, no acute changes. Pt continues of current plan of care and will continue to monitor. SRP, RN

## 2019-10-14 NOTE — Progress Notes (Signed)
Pt anxious during MRI requiring Ativan 1 mg, MD made aware, Ativan ordered and administered. Pt began to rest.

## 2019-10-14 NOTE — Progress Notes (Signed)
°PROGRESS NOTE ° °William Ali MRN:[id number] DOB: 12/02/1985 DOA: 10/12/2019 °PCP: Wright, Patrick E, MD ° °Brief History   °William Ali is a 34 y.o. 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 °Patient presented to the ED on 9/16 with 2 to 3 days history of worsening abdominal pain, left leg pain and generalized weakness after his last chemotherapy on 9/13.  He stated that he ran out of his fentanyl about 2 days ago.  °  °In the ED, patient was tachycardic to 170s, hypertensive to 174/90, tolerating room air. °Labs showed potassium low at 3.2, albumin low at 2.7, AST ALT elevated, WBC count normal at 6.1, hemoglobin low at 8.1 with macrocytosis, platelets low at 124, INR elevated to 6.9 °Covid PCR negative.  CT chest was negative for PE. The patien has been admitted to hospitalist service for further evaluation management. ° °CT abdomen pelvis showed some suggestion of inflammatory wall thickening in the transverse colon, suggestive of nonspecific infectious or inflammatory colitis. C Diff antigen was positive, but toxin was negative. However PCR was positive. The patient has been started on oral vancomycin. The patient is being treated for C Diff Colitis with oral vancomycin.  ° °Palliative care has been consulted to assist with pain control. I greatly appreciate their assistance. ° °Consultants  °• Palliative care °• Oncology ° °Procedures  °• None ° °Antibiotics  ° °Anti-infectives (From admission, onward)  ° Start     Dose/Rate Route Frequency Ordered Stop  ° 10/14/19 1000  vancomycin (VANCOCIN) 50 mg/mL oral solution 125 mg       ° 125 mg Oral 4 times daily 10/14/19 0858 10/24/19 0959  ° 10/12/19 2359  piperacillin-tazobactam (ZOSYN) IVPB 3.375 g  Status:  Discontinued       ° 3.375 g °12.5 mL/hr over 240 Minutes Intravenous Every 8 hours 10/12/19 1627 10/14/19 0859  ° 10/12/19 1515  vancomycin (VANCOREADY)  IVPB 2000 mg/400 mL       ° 2,000 mg °200 mL/hr over 120 Minutes Intravenous  Once 10/12/19 1452 10/12/19 1823  ° 10/12/19 1500  ceFEPIme (MAXIPIME) 2 g in sodium chloride 0.9 % 100 mL IVPB       ° 2 g °200 mL/hr over 30 Minutes Intravenous  Once 10/12/19 1449 10/12/19 1651  °  °•  ° °Subjective  °The patient is lying in bed. Pain control continues to be an issue. He states that his nausea, vomiting, and diarrhea. ° °Objective  ° °Vitals:  °Vitals:  ° 10/14/19 1020 10/14/19 1320  °BP: (!) 153/94 (!) 146/107  °Pulse: (!) 123 (!) 135  °Resp: 20 20  °Temp: 98 °F (36.7 °C) 97.9 °F (36.6 °C)  °SpO2: 98% 99%  ° °Exam: ° °Constitutional:  °• The patient is awake, alert, and oriented x 3. No acute distress. °Respiratory:  °• No increased work of breathing. °• No wheezes, rales, or rhonchi °• No tactile fremitus °Cardiovascular:  °• Regular rate and rhythm °• No murmurs, ectopy, or gallups. °• No lateral PMI. No thrills. °Abdomen:  °• Abdomen is soft, non-tender, non-distended °• No hernias, masses, or organomegaly °• Normoactive bowel sounds.  °Musculoskeletal:  °• No cyanosis, clubbing, or edema °Skin:  °• No rashes, lesions, ulcers °• palpation of skin: no induration or nodules °Neurologic:  °• CN 2-12 intact °• Sensation all 4 extremities intact °Psychiatric:  °• Mental status °o Mood, affect appropriate °o Orientation to   to person, place, time   judgment and insight appear intact  I have personally reviewed the following:   Today's Data   CMP, CBC, Vitals  Micro Data   C Diff: Antigen positive, Toxin negative, PCR positive  Blood culture x 2: No growth.  Imaging   MR of L and T spine: Pending  CXR: Right upper lobe mass again demonstrated  Cardiology Data   EKG: 10/12/2019: Prolonged QTc  EKG 10/14/2019: pending  Scheduled Meds:  Chlorhexidine Gluconate Cloth  6 each Topical Daily   dexamethasone  1 mg Oral Daily   [START ON 10/15/2019] DULoxetine  60 mg Oral Daily   fentaNYL  1 patch  Transdermal V78I   folic acid  1 mg Oral Daily   lidocaine  1 patch Transdermal Q24H   LORazepam       LORazepam  1-1.5 mg Intravenous Once   metoprolol succinate  50 mg Oral Daily   rivaroxaban  20 mg Oral Q supper   senna  1 tablet Oral QHS   sodium chloride flush  10-40 mL Intracatheter Q12H   sodium chloride flush  3 mL Intravenous Q12H   vancomycin  125 mg Oral QID   Continuous Infusions:  sodium chloride 50 mL/hr at 10/14/19 1204   lactated ringers Stopped (10/13/19 1105)    Principal Problem:   Sepsis ([place]) Active Problems:   Goals of care, counseling/discussion   Metastatic adenocarcinoma to brain ([place])   Adenocarcinoma of lung ([place])   Essential hypertension   Tachycardia   Colitis   Hypokalemia   Prolonged Q-T interval on ECG   Hypomagnesemia   Cancer related pain   LOS: 2 days   A & P  Severe sepsis - POA Acute transverse colitis -Presented with abdominal pain, generalized weakness. -Met sepsis criteria with Tachycardic, tachypneic, lactic acid 4.0 -CT abdomen/pelvis concerning for possible transversecoloninfectious versus inflammatory colitis -Blood cultures were sent.  Started on broad-spectrum antibiotics, IV hydration. -Continue IV Zosyn and IV vancomycin. -Blood pressure stable.  Lactic acid improving.  Continue IV fluid at a lower rate of 50 mill per hour. -Last bowel movement was Wednesday night, prior to presentation -Tachycardia gradually improving but is still over 100.  Continue to monitor. -Currently on clear liquid diet.  No vomiting.  Advance to regular diet today. Last Labs          Recent Labs  Lab 10/09/19 1133 10/12/19 1147 10/12/19 1148 10/12/19 1255 10/13/19 1115  WBC 3.1* 6.1  --   --  4.0  LATICACIDVEN  --   --  4.0* 2.1* 1.8     Left leg pain. History of left leg DVT: May 2021, patient had left leg DVT. Denies any h/o PE. He has been on Xarelto since then.  Reports good compliance.  Because of worsening left leg  pain, patient is concerned about recurrence of DVT.  Ultrasound duplex negative for DVT. CTA chest was negative for pulmonary embolism.-INR elevated to 6.9 at presentation. Per oncology not related to NOAC. Recommendation is to continue Xarelto. Continue Protonix 40 mg daily.  Acute on chronic cancer related back pain: On my examination today, patient has significant tenderness at all levels of spine, but very specifically at the site of the metastatic lesion. He denies recent fall. MRI thoracolumbar spine in May had shown compression fractures.  Repeat MRI of T and l spine is pending. I appreciate Palliative care's assistance in pain management. Unfortunately methadone will not be an option due to prolonged QTc. Will  repeat EKG.  Metastatic adenocarcinoma of the lung to brain, bone, liver:n Follows up with Dr. Lorenso Courier. most recent cycle of chemotherapy on 9/13(Pemetrexed/Pembrolizumab (held Carboplatin due to cytopenias).  Essential hypertension: The patient is on Toprol 50 mg daily at home.  Continue the same.  Continue monitor blood pressure.  IV hydralazine as needed.  Hypokalemia: Supplement and monitor.  Anxiety/depression: Continue Cymbalta/as needed Ativan  Code Status:  Code Status: Full Code  DVT prophylaxis:Rivaroxaban (XARELTO) tablet 20 mg   Family Communication: Not at bedside Disposition Status is: Inpatient  Remains inpatient appropriate because:Ongoing active pain requiring inpatient pain management, Ongoing diagnostic testing needed not appropriate for outpatient work up and IV treatments appropriate due to intensity of illness or inability to take PO   Dispo: The patient is from: Home  Anticipated d/c is to: Home  Anticipated d/c date is: 3 days  Patient currently is not medically stable to d/c.  Filemon Breton, DO [facility] Direct contact: see [email or web address]  7PM-7AM contact night coverage as above 10/14/2019, 2:45 PM   LOS: 2 days

## 2019-10-14 NOTE — Progress Notes (Signed)
Palliative Care Inpatient Consultation Symptom Management  William Ali is a 34 yo man with widely metastatic lung cancer and complex symptom management needs. He is admitted with cancer related pain crisis, colitis and electrolyte abnormalities. William Ali is well known to me from his previous admission. I have reviewed his record in detail and his treatment course since I last saw him. He reports that prior to a couple of weeks ago he had been on a good regimen for his pain and doing pretty well.   I reviewed the phone notes and history of his pain evolution I am not sure he has ever had consistent relief since late July and he tells me it has been up and down dealing with his pain and other symptoms. William Ali is very aware of his body and changes he experiences and seeks to make associations with those and with medication changes-this can be helpful but also frustrating when there are multiple layers of possible reasons why he is having difficult to manage symptoms.  His missed Fentanyl patch was on   Main Symptoms:  1. Pain that he can localize and I can pinpoint on his back that correlate specifically at the spinous processes of T12 to SI and the paraspinal musculature. These also correlate with the areas he has known vertebral metastasis. Palpable muscle spasm in those areas also made worse by being in the bed and immobility. Has not received Zometa yet.  2. Severe Tremor at rest and worse with intention. Myoclonic Jerks noted frequently today during my visit. His pain is actually getting worse with larger doses of hydromorphone- I am concerned about opioid induced neurotoxicity with the very large doses of hydromorphone he has been receiving acutely and over the course of the past few weeks-his tremor and muscle jerking and not being able to get out of the bed and function due to fatigue suggest this may be his underlying issue. The mix up with his fentanyl patch was reported on 8/25 and while that  may have triggered a pain crisis that should be resolved by now. He also has low mag, Low K which can cause muscle weakness and spasm.  3. Steroid Intolerance- reviewed notes re: Steroid causing hip girdle myopathy, cushionoid fluid retension and hyperglycemia- on 8/19 those were tapered from 8mg  daily and stopped on 9/2. He has no anti-inflammatory coverage currently and this may also drive his pain- ordered Toradol and very low dose of decadron. Will need to weigh side effects with benefits. Bone pain is difficult to manage w/o steroid or NSAID.  Recommendations:  1. Ideally I would like to start him on methadone for complex pain but he had a prolonged QT and a Mag of 1.2 so will not start this now. I have stopped hydromorphone for OIN- for now we should only use fentanyl and titrate that dose to affect.   2. Consider Radiation Oncology referral for directed tx of his painful vertebral mets.  3. Added Toradol low dose q6 prn  4. Repleted Mag-watch FEN.  5. If HTN gets worse or pain add on Clonidine.  6. Consider giving Zometa asap  7. Monitor neuro status frequently -high risk for SCC and SRE  8. May need diazapam for tremor and spasm, seizure prophylaxis as well.  William Hacker, DO Palliative Medicine   70 min Greater than 50%  of this time was spent counseling and coordinating care related to the above assessment and plan.

## 2019-10-14 NOTE — Plan of Care (Signed)
  Problem: Education: Goal: Knowledge of General Education information will improve Description Including pain rating scale, medication(s)/side effects and non-pharmacologic comfort measures Outcome: Progressing   Problem: Health Behavior/Discharge Planning: Goal: Ability to manage health-related needs will improve Outcome: Progressing   

## 2019-10-15 DIAGNOSIS — R1084 Generalized abdominal pain: Secondary | ICD-10-CM

## 2019-10-15 DIAGNOSIS — R079 Chest pain, unspecified: Secondary | ICD-10-CM

## 2019-10-15 LAB — COMPREHENSIVE METABOLIC PANEL
ALT: 108 U/L — ABNORMAL HIGH (ref 0–44)
AST: 129 U/L — ABNORMAL HIGH (ref 15–41)
Albumin: 2.7 g/dL — ABNORMAL LOW (ref 3.5–5.0)
Alkaline Phosphatase: 388 U/L — ABNORMAL HIGH (ref 38–126)
Anion gap: 12 (ref 5–15)
BUN: 12 mg/dL (ref 6–20)
CO2: 24 mmol/L (ref 22–32)
Calcium: 8.8 mg/dL — ABNORMAL LOW (ref 8.9–10.3)
Chloride: 104 mmol/L (ref 98–111)
Creatinine, Ser: 1 mg/dL (ref 0.61–1.24)
GFR calc Af Amer: >60 mL/min (ref >60)
GFR calc non Af Amer: >60 mL/min (ref >60)
Glucose, Bld: 133 mg/dL — ABNORMAL HIGH (ref 70–99)
Potassium: 4.1 mmol/L (ref 3.5–5.1)
Sodium: 140 mmol/L (ref 135–145)
Total Bilirubin: 1.1 mg/dL (ref 0.3–1.2)
Total Protein: 6.4 g/dL — ABNORMAL LOW (ref 6.5–8.1)

## 2019-10-15 LAB — CBC WITH DIFFERENTIAL/PLATELET
Abs Immature Granulocytes: 0.01 10*3/uL (ref 0.00–0.07)
Basophils Absolute: 0 10*3/uL (ref 0.0–0.1)
Basophils Relative: 0 %
Eosinophils Absolute: 0 10*3/uL (ref 0.0–0.5)
Eosinophils Relative: 0 %
HCT: 26 % — ABNORMAL LOW (ref 39.0–52.0)
Hemoglobin: 7.7 g/dL — ABNORMAL LOW (ref 13.0–17.0)
Immature Granulocytes: 0 %
Lymphocytes Relative: 3 %
Lymphs Abs: 0.1 10*3/uL — ABNORMAL LOW (ref 0.7–4.0)
MCH: 29.4 pg (ref 26.0–34.0)
MCHC: 29.6 g/dL — ABNORMAL LOW (ref 30.0–36.0)
MCV: 99.2 fL (ref 80.0–100.0)
Monocytes Absolute: 0.3 10*3/uL (ref 0.1–1.0)
Monocytes Relative: 12 %
Neutro Abs: 2.2 10*3/uL (ref 1.7–7.7)
Neutrophils Relative %: 85 %
Platelets: 88 10*3/uL — ABNORMAL LOW (ref 150–400)
RBC: 2.62 MIL/uL — ABNORMAL LOW (ref 4.22–5.81)
RDW: 16.3 % — ABNORMAL HIGH (ref 11.5–15.5)
WBC: 2.6 10*3/uL — ABNORMAL LOW (ref 4.0–10.5)
nRBC: 0 % (ref 0.0–0.2)

## 2019-10-15 LAB — GLUCOSE, CAPILLARY: Glucose-Capillary: 135 mg/dL — ABNORMAL HIGH (ref 70–99)

## 2019-10-15 LAB — MAGNESIUM: Magnesium: 2.2 mg/dL (ref 1.7–2.4)

## 2019-10-15 MED ORDER — BACLOFEN 10 MG PO TABS
5.0000 mg | ORAL_TABLET | Freq: Three times a day (TID) | ORAL | Status: AC
  Administered 2019-10-15 – 2019-10-17 (×6): 5 mg via ORAL
  Filled 2019-10-15 (×6): qty 1

## 2019-10-15 NOTE — Progress Notes (Signed)
Pt presents with increase confusion this am, pulling at tubes and lines. Hallucinating and picking in air. Wife at bedside, and stayed with pt during the night the maintain safety and reassure pt.  will cont to monitor. SRP, RN

## 2019-10-15 NOTE — Progress Notes (Signed)
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 In the emergency room, tachycardic heart rate 170s, hypertensive blood pressure 174/90.  On room air.  INR 6.9.  COVID-19 negative.  CT chest negative for PE.  CT scan showed transverse colitis, C. difficile negative. MRI of the spine show increased tumor burden, stable metastatic fracture of the spine.   Assessment & Plan:   Principal Problem:   Sepsis ([place]) Active Problems:   Goals of care, counseling/discussion   Metastatic adenocarcinoma to brain [facility])   Adenocarcinoma of lung ([place])   Essential hypertension   Tachycardia   Colitis   Hypokalemia   Prolonged Q-T interval on ECG   Hypomagnesemia   Cancer related pain  Severe sepsis present on admission, acute transverse colitis: Met sepsis criteria with tachycardia, tachypnea and lactic acid of 4.  Treated with IV fluids. Treated with broad-spectrum antibiotics for 2 days, discontinued with no source of infection.  No more diarrhea. C. difficile antigen positive, antibody negative.  GI panel negative.  Because of symptomatic disease treating with 10 days of oral vancomycin.  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  Due to persistent pain a repeat scan was done.  Duplex is negative for DVT.  CTA negative for PE.  INR 6.9 on presentation.  Xarelto continued.  Acute on chronic cancer related back pain: Patient continues to have intractable pain.  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  Difficulties with tolerating opiates and suspected causing neuropathy. Currently on fentanyl patch Fentanyl  IV Toradol IV Tapered doses of dexamethasone. Appreciate palliative care input. Patient with tremulousness, impulsiveness probably related to neurological symptoms secondary to opiates, appreciate palliative care team working on pain medications and symptomatic control.  Adenocarcinoma of the lung metastasis to bones, liver, brain: Followed by oncology.  Patient on chemotherapy.  They wish to continue to treat him.  Hopefully patient will be able to have normal functional life and go back to chemotherapy.  Hypomagnesemia: Replaced.  Normalized.  DVT prophylaxis:  rivaroxaban (XARELTO) tablet 20 mg   Code Status: Full code Family Communication: Wife at the bedside Disposition Plan: Status is: Inpatient  Remains inpatient appropriate because:IV treatments appropriate due to intensity of illness or inability to take PO and Inpatient level of care appropriate due to severity of illness   Dispo: The patient is from: Home              Anticipated d/c is to: Home              Anticipated d/c date is: 3 days              Patient currently is not medically stable to d/c.         Consultants:   Palliative team  Oncology  Procedures:   None  Antimicrobials:  Antibiotics Given (last 72 hours)    Date/Time Action Medication Dose Rate   10/12/19 1621 New Bag/Given   ceFEPIme (MAXIPIME) 2 g in sodium chloride 0.9 % 100 mL IVPB 2 g 200 mL/hr   10/12/19 1623 New Bag/Given   vancomycin (VANCOREADY) IVPB 2000 mg/400 mL 2,000 mg  200 mL/hr   10/13/19 0040 New Bag/Given   piperacillin-tazobactam (ZOSYN) IVPB 3.375 g 3.375 g 12.5 mL/hr   10/13/19 0829 New Bag/Given   piperacillin-tazobactam (ZOSYN) IVPB 3.375 g 3.375 g 12.5 mL/hr   10/13/19 1756 New Bag/Given   piperacillin-tazobactam (ZOSYN) IVPB 3.375 g 3.375 g 12.5 mL/hr   10/13/19 2338 New Bag/Given   piperacillin-tazobactam (ZOSYN) IVPB 3.375 g 3.375 g 12.5 mL/hr   10/14/19 1024 Given   vancomycin (VANCOCIN) 50 mg/mL oral  solution 125 mg 125 mg    10/14/19 1348 Given   vancomycin (VANCOCIN) 50 mg/mL oral solution 125 mg 125 mg    10/14/19 1704 Given   vancomycin (VANCOCIN) 50 mg/mL oral solution 125 mg 125 mg    10/14/19 2236 Given   vancomycin (VANCOCIN) 50 mg/mL oral solution 125 mg 125 mg          Subjective: Patient seen and examined.  Overnight events noted.  Wife was at the bedside.  Overnight he has remained restless, impulsive, delirious and hallucinating at times. Patient himself stated to me that his pain is not controlled, he did not sleep last night.  He was shaky and tremulous.  No more diarrhea.  Objective: Vitals:   10/14/19 2140 10/15/19 0016 10/15/19 0429 10/15/19 1022  BP: (!) 150/94 (!) 142/88 (!) 162/81 (!) 161/87  Pulse: (!) 101 (!) 102 (!) 125 (!) 109  Resp: _0 (!) 24  Temp: 98 F (36.7 C) 98.3 F (36.8 C) 98.3 F (36.8 C) 97.8 F (36.6 C)  TempSrc: Oral Oral Oral Oral  SpO2: 97% 98% 98% 100%  Weight:      Height:        Intake/Output Summary (Last 24 hours) at 10/15/2019 1111 Last data filed at 10/15/2019 0834 Gross per 24 hour  Intake 120 ml  Output 450 ml  Net -330 ml   Filed Weights   10/12/19 1541 10/13/19 1611  Weight: 95.3 kg 95.7 kg    Examination:  General exam: Appears anxious, impulsive, shaky and tremulous, chronically sick looking. Respiratory system: Clear to auscultation. Respiratory effort normal.  No added sounds.  Port-A-Cath present on the right chest. Cardiovascular system: S1 & S2 heard, RRR. No JVD, murmurs, rubs, gallops or clicks.  1+ bilateral pedal edema, some tenderness along the left calf. Gastrointestinal system: Abdomen distended but nontender.  Bowel sounds present. Central nervous system: Alert and oriented. No focal neurological deficits.  Generalized weakness. Extremities: Symmetric 5 x 5 power. Skin: No rashes, lesions or ulcers Psychiatry: Judgement and insight appear compromised.  Anxious and impulsive.   New    Data Reviewed: I have personally reviewed following labs and imaging studies  CBC: Recent Labs  Lab 10/09/19 1133 10/12/19 1147 10/13/19 1115 10/14/19 0838 10/15/19 0419  WBC 3.1* 6.1 4.0 2.4* 2.6*  NEUTROABS 2.0  --  3.7 2.0 2.2  HGB 8.7* 8.1* 7.4* 7.4* 7.7*  HCT 29.5* 27.8* 24.2* 25.4* 26.0*  MCV 100.7* 100.4* 99.2 99.2 99.2  PLT 146* 124* 124* 103* 88*   Basic Metabolic Panel: Recent Labs  Lab 10/09/19 1133 10/12/19 1149 10/13/19 1115 10/14/19 0838 10/15/19 0419  NA 145 140 141 140 140  K 3.6 3.2* 3.5 3.8 4.1  CL 109 107 103 104 104  CO2 24 20* _1 GLUCOSE 139* 170* 147* 106* 133*  BUN _2 CREATININE 1.08 1.16 1.13 0.95 1.00  CALCIUM 8.5* 7.1* 7.5* 7.9* 8.8*  MG  --   --  1.2*  --  2.2  PHOS  --   --  2.8  --   --    GFR: Estimated Creatinine Clearance: 113.6 mL/min (by C-G formula based on SCr of 1 mg/dL). Liver Function Tests: Recent Labs  Lab 10/09/19 1133 10/12/19 1149 10/14/19 0838 10/15/19 0419  AST 117* 120* 109* 129*  ALT 160* 118* 105* 108*  ALKPHOS 596* 401* 359* 388*  BILITOT 0.5 1.5* 1.3* 1.1  PROT 6.4* 5.8* 5.7* 6.4*  ALBUMIN 3.0* 2.7* 2.6* 2.7*   Recent Labs  Lab 10/12/19 1149  LIPASE 19   No results for input(s): AMMONIA in the last 168 hours. Coagulation Profile: Recent Labs  Lab 10/12/19 1148 10/13/19 0430  INR 6.9* 2.7*   Cardiac Enzymes: No results for input(s): CKTOTAL, CKMB, CKMBINDEX, TROPONINI in the last 168 hours. BNP (last 3 results) No results for input(s): PROBNP in the last 8760 hours. HbA1C: No results for input(s): HGBA1C in the last 72 hours. CBG: Recent Labs  Lab 10/15/19 0440  GLUCAP 135*   Lipid Profile: No results for input(s): CHOL, HDL, LDLCALC, TRIG, CHOLHDL, LDLDIRECT in the last 72 hours. Thyroid Function Tests: No results for input(s): TSH, T4TOTAL, FREET4, T3FREE, THYROIDAB in the last 72 hours. Anemia Panel: No results for input(s): VITAMINB12, FOLATE, FERRITIN,  TIBC, IRON, RETICCTPCT in the last 72 hours. Sepsis Labs: Recent Labs  Lab 10/12/19 1148 10/12/19 1255 10/13/19 1115  LATICACIDVEN 4.0* 2.1* 1.8    Recent Results (from the past 240 hour(s))  Culture, blood (routine x 2)     Status: None (Preliminary result)   Collection Time: 10/12/19 11:49 AM   Specimen: BLOOD  Result Value Ref Range Status   Specimen Description   Final    BLOOD PORTA CATH Performed at [facility] [address] [place], [place] [address]    Special Requests   Final    BOTTLES DRAWN AEROBIC AND ANAEROBIC Blood Culture adequate volume Performed at [facility] [address], [place], [place] [address]    Culture   Final    NO GROWTH 2 DAYS Performed at [facility] [address] [place], [place] [address]    Report Status PENDING  Incomplete  Culture, blood (routine x 2)     Status: None (Preliminary result)   Collection Time: 10/12/19 11:50 AM   Specimen: BLOOD  Result Value Ref Range Status   Specimen Description   Final    BLOOD RIGHT ANTECUBITAL Performed at [facility] [address] [place], [place] [address]    Special Requests   Final    BOTTLES DRAWN AEROBIC AND ANAEROBIC Blood Culture adequate volume Performed at [facility] [address], [place], [place] [address]    Culture   Final    NO GROWTH 2 DAYS Performed at [facility] [address] [place],  [address]    Report Status PENDING  Incomplete  SARS Coronavirus 2 by RT PCR (hospital order, performed in [facility] hospital lab) Nasopharyngeal Nasopharyngeal Swab     Status: None   Collection Time: 10/12/19 11:50 AM   Specimen: Nasopharyngeal Swab  Result Value Ref Range Status   SARS Coronavirus 2 NEGATIVE NEGATIVE Final    Comment: (NOTE) SARS-CoV-2 target nucleic acids are NOT DETECTED.  The SARS-CoV-2 RNA is generally detectable in upper and lower respiratory  specimens during the acute phase of infection. The lowest concentration of SARS-CoV-2 viral copies this assay can  detect is 250 copies / mL. A negative result does not preclude SARS-CoV-2 infection and should not be used as the sole basis for treatment or other patient management decisions.  A negative result may occur with improper specimen collection / handling, submission of specimen other than nasopharyngeal swab, presence of viral mutation(s) within the areas targeted by this assay, and inadequate number of viral copies (<250 copies / mL). A negative result must be combined with clinical observations, patient history, and epidemiological information.  Fact Sheet for Patients:   StrictlyIdeas.no  Fact Sheet for Healthcare Providers: BankingDealers.co.za  This test is not yet approved or  cleared by the [place] FDA and has been authorized for detection and/or diagnosis of SARS-CoV-2 by FDA under an Emergency Use Authorization (EUA).  This EUA will remain in effect (meaning this test can be used) for the duration of the COVID-19 declaration under Section 564(b)(1) of the Act, 21 U.S.C. section 360bbb-3(b)(1), unless the authorization is terminated or revoked sooner.  Performed at [facility], [place] [address], [place], [place] [address]   Gastrointestinal Panel by PCR , Stool     Status: None   Collection Time: 10/13/19  4:15 PM   Specimen: STOOL  Result Value Ref Range Status   Campylobacter species NOT DETECTED NOT DETECTED Final   Plesimonas shigelloides NOT DETECTED NOT DETECTED Final   Salmonella species NOT DETECTED NOT DETECTED Final   Yersinia enterocolitica NOT DETECTED NOT DETECTED Final   Vibrio species NOT DETECTED NOT DETECTED Final   Vibrio cholerae NOT DETECTED NOT DETECTED Final   Enteroaggregative E coli (EAEC) NOT DETECTED NOT DETECTED Final   Enteropathogenic E coli (EPEC) NOT DETECTED  NOT DETECTED Final   Enterotoxigenic E coli (ETEC) NOT DETECTED NOT DETECTED Final   Shiga like toxin producing E coli (STEC) NOT DETECTED NOT DETECTED Final   Shigella/Enteroinvasive E coli (EIEC) NOT DETECTED NOT DETECTED Final   Cryptosporidium NOT DETECTED NOT DETECTED Final   Cyclospora cayetanensis NOT DETECTED NOT DETECTED Final   Entamoeba histolytica NOT DETECTED NOT DETECTED Final   Giardia lamblia NOT DETECTED NOT DETECTED Final   Adenovirus F40/41 NOT DETECTED NOT DETECTED Final   Astrovirus NOT DETECTED NOT DETECTED Final   Norovirus GI/GII NOT DETECTED NOT DETECTED Final   Rotavirus A NOT DETECTED NOT DETECTED Final   Sapovirus (I, II, IV, and V) NOT DETECTED NOT DETECTED Final    Comment: Performed at [facility], [address] [place], [place] [address]  C Difficile Quick Screen w PCR reflex     Status: Abnormal   Collection Time: 10/13/19  4:15 PM   Specimen: STOOL  Result Value Ref Range Status   C Diff antigen POSITIVE (A) NEGATIVE Final   C Diff toxin NEGATIVE NEGATIVE Final   C Diff interpretation Results are indeterminate. See PCR results.  Final    Comment: Performed at [facility], [place] [address], [place], [place] [address]  C. Diff by PCR, Reflexed     Status: Abnormal   Collection Time: 10/13/19  4:15 PM  Result Value Ref Range Status   Toxigenic C. Difficile by PCR POSITIVE (A) NEGATIVE Final    Comment: Positive for toxigenic C. difficile with little to no toxin production. Only treat if clinical presentation suggests symptomatic illness. Performed at [facility], [place] [address] [place],  [address]   MRSA PCR Screening     Status: None   Collection Time: 10/14/19  2:05 AM  Specimen: Nasopharyngeal  Result Value Ref Range Status   MRSA by PCR NEGATIVE NEGATIVE Final    Comment:        The GeneXpert MRSA Assay (FDA approved for NASAL specimens only), is one component of a comprehensive MRSA  colonization surveillance program. It is not intended to diagnose MRSA infection nor to guide or monitor treatment for MRSA infections. Performed at [facility] [place] [address] [place], [place] [address]          Radiology Studies: MR THORACIC SPINE WO CONTRAST  Result Date: 10/14/2019 CLINICAL DATA:  34 year old male with metastatic lung cancer. History of T3 pathologic fracture and multilevel thoracic ventral epidural tumor in May. Back pain. EXAM: MRI THORACIC SPINE WITHOUT CONTRAST TECHNIQUE: Multiplanar, multisequence MR imaging of the thoracic spine was performed. No intravenous contrast was administered. COMPARISON:  Thoracic MRI 06/17/2019. FINDINGS: The examination had to be discontinued prior to completion due to pain. Only one set of motion degraded axial T2 weighted images could be obtained. Limited cervical spine imaging: Subtotal involvement of the C7 vertebra by tumor. Preserved cervicothoracic junction alignment. Thoracic spine segmentation: Appears to be normal, same numbering system used in May. Alignment:  Stable since May. Vertebrae: Diffuse thoracic bone metastases, with subtotal to complete vertebral infiltration redemonstrated at T1, T3, T6. Multilevel posterior element involvement, worst at T5 as before. Stable T3 vertebral body pathologic compression fracture. No new pathologic fracture identified in the thoracic spine. No areas of progressed marrow edema since May. Cord: No thoracic spinal cord edema has developed since May. Stable noncontrast appearance of the cord. Conus medullaris occurs below T12-L1. Paraspinal and other soft tissues: Small right pleural effusion has increased. Otherwise grossly stable visible thoracic and upper abdominal viscera. Thoracic paraspinal soft tissues have not significantly changed, including some bilateral erector spinae muscle edema related to posterior element tumor involvement. Disc levels: Motion degraded axial  images today such that epidural extension of tumor and mild spinal stenosis can only be discerned from sagittal sequences. Within that limitation, the epidural tumor extension and mild spinal stenosis at T3, T5, T6, T7, and T9 appears stable since May. New mild ventral epidural tumor is suspected at T11 now, with new mild spinal stenosis there. Epidural tumor involving the bilateral T5 neural foramina has increased, but is stable at the right T6 neural foramen. IMPRESSION: 1. Largely stable diffuse thoracic spine metastases since the noncontrast MRI in May. 2. New mild epidural tumor and mild spinal stenosis at the T11 level. Increased epidural tumor in the bilateral T5 neural foramina, stable at the right T6 foramen. Stable epidural extension and mild spinal stenosis at T3, T5, T6, T7, and T9. No associated spinal cord edema. 3. Stable T3 pathologic fracture.  No new pathologic fracture. 4. Layering right pleural effusion appears increased. Electronically Signed   By: Genevie Ann M.D.   On: 10/14/2019 15:57   MR LUMBAR SPINE WO CONTRAST  Result Date: 10/14/2019 CLINICAL DATA:  34 year old male with metastatic lung cancer. History of T3 pathologic fracture and multilevel thoracic ventral epidural tumor in May. Back pain. EXAM: MRI LUMBAR SPINE WITHOUT CONTRAST TECHNIQUE: Multiplanar, multisequence MR imaging of the lumbar spine was performed. No intravenous contrast was administered. COMPARISON:  Thoracic spine MRI today reported separately. Lumbar MRI 06/17/2019. FINDINGS: Segmentation: Normal, concordant with the thoracic spine numbering today. Alignment:  Stable since May. Vertebrae: Diffuse lumbar vertebral metastatic disease again noted. Near complete tumor infiltration of the L2 vertebra as before. Increased L3 vertebral body tumor  since May. Extensive left L1 and right L4 vertebral tumor appears stable. L5 involvement appears stable. Diffuse tumor infiltration of the left sacral ala and central lower sacral  segments appears stable. Mild pathologic fracture of the L2 superior endplate is stable. No new pathologic fracture. Conus medullaris and cauda equina: Conus extends to the L1 level. No signal abnormality in the visible lower thoracic cord or conus. Paraspinal and other soft tissues: Stable since May. Negative visible abdominal viscera. Mild lumbar paraspinal muscle edema related to tumor. Disc levels: New ventral epidural tumor at both L1 and L2, with mild new spinal stenosis at both levels. No foraminal tumor at this time. Possible early right ventral epidural tumor now at L3, although new spinal stenosis there appears more related to increased epidural lipomatosis. Mildly increased epidural lipomatosis and associated spinal stenosis also at L4 and L5. Superimposed lumbar spine degeneration, maximal at L3-L4, is stable. IMPRESSION: 1. Diffuse lumbosacral metastatic disease re-demonstrated. Stable mild L2 pathologic fracture. No new pathologic fracture. 2. Increased tumor infiltration of the L3 vertebra since May. Mild new ventral epidural tumor at L1, L2, and probably also L3. New mild spinal stenosis at those levels. 3. Increased lumbar spinal stenosis elsewhere related to interval increased epidural lipomatosis. Electronically Signed   By: Genevie Ann M.D.   On: 10/14/2019 16:06   VAS [place] LOWER EXTREMITY VENOUS (DVT)  Result Date: 10/13/2019  Lower Venous DVTStudy Indications: Swelling.  Anticoagulation: Eliquis. Limitations: Unable to tolerate compression- left leg. Comparison Study: 06/04/19 previous Performing Technologist: Abram Sander RVS  Examination Guidelines: A complete evaluation includes B-mode imaging, spectral Doppler, color Doppler, and power Doppler as needed of all accessible portions of each vessel. Bilateral testing is considered an integral part of a complete examination. Limited examinations for reoccurring indications may be performed as noted. The reflux portion of the exam is performed with  the patient in reverse Trendelenburg.  +---------+---------------+---------+-----------+----------+--------------+ RIGHT    CompressibilityPhasicitySpontaneityPropertiesThrombus Aging +---------+---------------+---------+-----------+----------+--------------+ CFV      Full           Yes      Yes                                 +---------+---------------+---------+-----------+----------+--------------+ SFJ      Full                                                        +---------+---------------+---------+-----------+----------+--------------+ FV Prox  Full                                                        +---------+---------------+---------+-----------+----------+--------------+ FV Mid   Full                                                        +---------+---------------+---------+-----------+----------+--------------+ FV DistalFull                                                        +---------+---------------+---------+-----------+----------+--------------+  PFV      Full                                                        +---------+---------------+---------+-----------+----------+--------------+ POP      Full           Yes      Yes                                 +---------+---------------+---------+-----------+----------+--------------+ PTV      Full                                                        +---------+---------------+---------+-----------+----------+--------------+ PERO                                                  Not visualized +---------+---------------+---------+-----------+----------+--------------+   +---------+---------------+---------+-----------+----------+--------------+ LEFT     CompressibilityPhasicitySpontaneityPropertiesThrombus Aging +---------+---------------+---------+-----------+----------+--------------+ CFV      Full           Yes      Yes                                  +---------+---------------+---------+-----------+----------+--------------+ SFJ      Full                                                        +---------+---------------+---------+-----------+----------+--------------+ FV Prox                 Yes      Yes                                 +---------+---------------+---------+-----------+----------+--------------+ FV Mid                  Yes      Yes                                 +---------+---------------+---------+-----------+----------+--------------+ FV Distal               Yes      Yes                                 +---------+---------------+---------+-----------+----------+--------------+ PFV                     Yes      Yes                                 +---------+---------------+---------+-----------+----------+--------------+  POP      Full           Yes      Yes                                 +---------+---------------+---------+-----------+----------+--------------+ PTV      Full                                                        +---------+---------------+---------+-----------+----------+--------------+ PERO                                                  Not visualized +---------+---------------+---------+-----------+----------+--------------+     Summary: BILATERAL: - No evidence of deep vein thrombosis seen in the lower extremities, bilaterally. - No evidence of superficial venous thrombosis in the lower extremities, bilaterally. -  LEFT: - Portions of this examination were limited- see technologist comments above.  *See table(s) above for measurements and observations. Electronically signed by Servando Snare MD on 10/13/2019 at 3:39:36 PM.    Final         Scheduled Meds: . Chlorhexidine Gluconate Cloth  6 each Topical Daily  . DULoxetine  60 mg Oral Daily  . fentaNYL  1 patch Transdermal Q72H  . folic acid  1 mg Oral Daily  . lidocaine  1 patch Transdermal Q24H  . metoprolol  succinate  50 mg Oral Daily  . rivaroxaban  20 mg Oral Q supper  . senna  1 tablet Oral QHS  . sodium chloride flush  10-40 mL Intracatheter Q12H  . sodium chloride flush  3 mL Intravenous Q12H  . vancomycin  125 mg Oral QID   Continuous Infusions: . sodium chloride 50 mL/hr at 10/14/19 1204  . lactated ringers Stopped (10/13/19 1105)     LOS: 3 days    Time spent: 35 minutes    Barb Merino, MD [facility] Pager [phone]

## 2019-10-15 NOTE — Progress Notes (Signed)
Daily Progress Note   Patient Name: William Ali       Date: 10/15/2019 DOB: 06-11-1985  Age: 34 y.o. MRN#: [id number] Attending Physician: Barb Merino, MD Primary Care Physician: Elsie Stain, MD Admit Date: 10/12/2019  Reason for Consultation/Follow-up: Pain control  Subjective:  awake, able to interact, able to be redirected, although confused, wife at bedside.  Wife states that patient had severe confusion hallucinations overnight. Mag has been repleted, labs and chart reviewed. Patient ate about 25% breakfast.  Brief life review performed, patient lives at home with wife, has 2 young daughters.  See below.   Length of Stay: 3  Current Medications: Scheduled Meds:  . baclofen  5 mg Oral TID  . Chlorhexidine Gluconate Cloth  6 each Topical Daily  . DULoxetine  60 mg Oral Daily  . fentaNYL  1 patch Transdermal Q72H  . folic acid  1 mg Oral Daily  . lidocaine  1 patch Transdermal Q24H  . metoprolol succinate  50 mg Oral Daily  . rivaroxaban  20 mg Oral Q supper  . senna  1 tablet Oral QHS  . sodium chloride flush  10-40 mL Intracatheter Q12H  . sodium chloride flush  3 mL Intravenous Q12H  . vancomycin  125 mg Oral QID    Continuous Infusions: . sodium chloride 50 mL/hr at 10/14/19 1204  . lactated ringers Stopped (10/13/19 1105)    PRN Meds: acetaminophen **OR** acetaminophen, fentaNYL (SUBLIMAZE) injection, ketorolac, labetalol, ondansetron, pantoprazole, polyethylene glycol, polyvinyl alcohol, sodium chloride flush  Physical Exam         Awake but confused Mild to moderate distress Regular work of breathing S 1 S 2  Has pain lower back Has myoclonic jerks LE No edema  Vital Signs: BP (!) 161/87 (BP Location: Right Arm)   Pulse (!) 109   Temp 97.8 F  (36.6 C) (Oral)   Resp (!) 24   Ht 5\' 7"  (1.702 m)   Wt 95.7 kg   SpO2 100%   BMI 33.05 kg/m  SpO2: SpO2: 100 % O2 Device: O2 Device: Room Air O2 Flow Rate:    Intake/output summary:   Intake/Output Summary (Last 24 hours) at 10/15/2019 1132 Last data filed at 10/15/2019 0834 Gross per 24 hour  Intake 120 ml  Output 450 ml  Net -330  ml   LBM: Last BM Date: 10/14/19 Baseline Weight: Weight: 95.3 kg Most recent weight: Weight: 95.7 kg       Palliative Assessment/Data:      Patient Active Problem List   Diagnosis Date Noted  . Prolonged Q-T interval on ECG 10/14/2019  . Hypomagnesemia 10/14/2019  . Cancer related pain 10/14/2019  . SIRS (systemic inflammatory response syndrome) ([place]) 10/12/2019  . Sepsis ([place]) 10/12/2019  . Tachycardia 10/12/2019  . Colitis 10/12/2019  . Hypokalemia 10/12/2019  . Palliative care encounter   . Metastatic primary lung cancer ([place]) 06/17/2019  . 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 Pulmonary embolism ([place]) 06/13/2019  . DVT (deep venous thrombosis) ([place]) 06/13/2019  . Goals of care, counseling/discussion 05/21/2019  . Metastatic adenocarcinoma to brain ([place]) 05/21/2019  . Adenocarcinoma of lung ([place]) 05/21/2019  . Essential hypertension 08/17/2018  . 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 CT scan with transverse colitis, MRI spine with increased tumor burden, stable metastatic fracture of the spine.   Assessment: Severe pain, uncontrolled at times.  Myoclonic jerks, tremor, hallucinations worse at night.  Back pain at site of T12  to S1 Electrolyte abnormalities.  PPS 40%  Recommendations/Plan: Discussed with patient and wife who is at the bedside. Also discussed with TRH MD. Med history discussed with wife.  Continue trans dermal fentanyl as well as Fentanyl IV PRN.  Patient also on adjuvants such as Cymbalta, decadron and Toradol.  Add K pad for comfort.  Will attempt a trial of non opioid adjuvant Baclofen 5 mg PO TID for 6 doses and monitor. Continue IVF, encourage PO.   Discussed with wife about multi factorial etiology of patient's current presentation: uncontrolled pain, electrolyte abnormalities, dehydration, neuro excitatory effects of opioids.   Goals of Care and Additional Recommendations: Limitations on Scope of Treatment: Full Scope Treatment  Code Status:    Code Status Orders  (From admission, onward)         Start     Ordered   10/12/19 2242  Full code  Continuous        10/12/19 2241        Code Status History    Date Active Date Inactive Code Status Order ID Comments User Context   06/17/2019 1740 06/28/2019 2149 Full Code [id number]  Shelly Coss, MD ED   06/13/2019 1525 06/16/2019 2244 Full Code [id number]  Georgette Shell, MD ED   Advance Care Planning Activity      Prognosis:  Unable to determine  Discharge Planning: To Be Determined  Care plan was discussed with  Patient, wife, TRH MD.   Thank you for allowing the Palliative Medicine Team to assist in the care of this patient.   Time In: 9 Time Out: 9.35 Total Time 35 Prolonged Time Billed No       Greater than 50%  of this time was spent counseling and coordinating care related to the above assessment and plan.  Loistine Chance, MD  Please contact Palliative Medicine Team phone at [phone] for questions and concerns.

## 2019-10-15 NOTE — Plan of Care (Signed)
  Problem: Education: Goal: Knowledge of General Education information will improve Description Including pain rating scale, medication(s)/side effects and non-pharmacologic comfort measures Outcome: Progressing   Problem: Health Behavior/Discharge Planning: Goal: Ability to manage health-related needs will improve Outcome: Progressing   

## 2019-10-15 NOTE — Progress Notes (Addendum)
Patient with increasing confusion since the start of the shift. He is having auditory hallucinations, pulling his tele leads and pulse ox lead off and made few attempts to get OOB. Spouse remained at the bedside due to his increasing confusion.  Bilateral mittens placed as pt was attempting to remove his porta cath. Continues to be in yellow MEWS due to HR. Will continue Q4 v/s

## 2019-10-16 DIAGNOSIS — Z515 Encounter for palliative care: Secondary | ICD-10-CM

## 2019-10-16 LAB — CBC WITH DIFFERENTIAL/PLATELET
Abs Immature Granulocytes: 0.04 10*3/uL (ref 0.00–0.07)
Basophils Absolute: 0 10*3/uL (ref 0.0–0.1)
Basophils Relative: 0 %
Eosinophils Absolute: 0 10*3/uL (ref 0.0–0.5)
Eosinophils Relative: 0 %
HCT: 20.9 % — ABNORMAL LOW (ref 39.0–52.0)
Hemoglobin: 6.4 g/dL — CL (ref 13.0–17.0)
Immature Granulocytes: 1 %
Lymphocytes Relative: 2 %
Lymphs Abs: 0.1 10*3/uL — ABNORMAL LOW (ref 0.7–4.0)
MCH: 29.8 pg (ref 26.0–34.0)
MCHC: 30.6 g/dL (ref 30.0–36.0)
MCV: 97.2 fL (ref 80.0–100.0)
Monocytes Absolute: 0.5 10*3/uL (ref 0.1–1.0)
Monocytes Relative: 17 %
Neutro Abs: 2.4 10*3/uL (ref 1.7–7.7)
Neutrophils Relative %: 80 %
Platelets: 57 10*3/uL — ABNORMAL LOW (ref 150–400)
RBC: 2.15 MIL/uL — ABNORMAL LOW (ref 4.22–5.81)
RDW: 16 % — ABNORMAL HIGH (ref 11.5–15.5)
WBC: 3 10*3/uL — ABNORMAL LOW (ref 4.0–10.5)
nRBC: 0.7 % — ABNORMAL HIGH (ref 0.0–0.2)

## 2019-10-16 LAB — COMPREHENSIVE METABOLIC PANEL
ALT: 115 U/L — ABNORMAL HIGH (ref 0–44)
AST: 174 U/L — ABNORMAL HIGH (ref 15–41)
Albumin: 2.5 g/dL — ABNORMAL LOW (ref 3.5–5.0)
Alkaline Phosphatase: 331 U/L — ABNORMAL HIGH (ref 38–126)
Anion gap: 10 (ref 5–15)
BUN: 16 mg/dL (ref 6–20)
CO2: 24 mmol/L (ref 22–32)
Calcium: 8.3 mg/dL — ABNORMAL LOW (ref 8.9–10.3)
Chloride: 106 mmol/L (ref 98–111)
Creatinine, Ser: 0.99 mg/dL (ref 0.61–1.24)
GFR calc Af Amer: >60 mL/min (ref >60)
GFR calc non Af Amer: >60 mL/min (ref >60)
Glucose, Bld: 119 mg/dL — ABNORMAL HIGH (ref 70–99)
Potassium: 4.1 mmol/L (ref 3.5–5.1)
Sodium: 140 mmol/L (ref 135–145)
Total Bilirubin: 0.9 mg/dL (ref 0.3–1.2)
Total Protein: 5.5 g/dL — ABNORMAL LOW (ref 6.5–8.1)

## 2019-10-16 LAB — HEMOGLOBIN AND HEMATOCRIT, BLOOD
HCT: 28.6 % — ABNORMAL LOW (ref 39.0–52.0)
Hemoglobin: 9.2 g/dL — ABNORMAL LOW (ref 13.0–17.0)

## 2019-10-16 LAB — ABO/RH: ABO/RH(D): O POS

## 2019-10-16 LAB — PREPARE RBC (CROSSMATCH)

## 2019-10-16 MED ORDER — SODIUM CHLORIDE 0.9% IV SOLUTION: Freq: Once | INTRAVENOUS | Status: DC

## 2019-10-16 NOTE — Progress Notes (Signed)
Daily Progress Note   Patient Name: William Ali       Date: 10/16/2019 DOB: 1985/07/31  Age: 34 y.o. MRN#: [id number] Attending Physician: Barb Merino, MD Primary Care Physician: Elsie Stain, MD Admit Date: 10/12/2019  Reason for Consultation/Follow-up: Pain control  Subjective:  resting comfortably, snoring actually, wife at bedside, states that patient's pain is better controlled and that he has been less confused in the past 24 hours. Bedside RN also in the room, still with minimal PO intake, getting PRBC today for low Hgb.    See below.   Length of Stay: 4  Current Medications: Scheduled Meds:  . sodium chloride   Intravenous Once  . baclofen  5 mg Oral TID  . Chlorhexidine Gluconate Cloth  6 each Topical Daily  . DULoxetine  60 mg Oral Daily  . fentaNYL  1 patch Transdermal Q72H  . folic acid  1 mg Oral Daily  . lidocaine  1 patch Transdermal Q24H  . metoprolol succinate  50 mg Oral Daily  . senna  1 tablet Oral QHS  . sodium chloride flush  10-40 mL Intracatheter Q12H  . sodium chloride flush  3 mL Intravenous Q12H  . vancomycin  125 mg Oral QID    Continuous Infusions: . sodium chloride 50 mL/hr at 10/16/19 0511  . lactated ringers Stopped (10/13/19 1105)    PRN Meds: acetaminophen **OR** acetaminophen, fentaNYL (SUBLIMAZE) injection, ketorolac, labetalol, ondansetron, pantoprazole, polyethylene glycol, polyvinyl alcohol, sodium chloride flush  Physical Exam         Resting comfortably Not in distress this morning.  Regular work of breathing S 1 S 2  Not noted to have myoclonic jerks LE at the time of my visit.  No edema  Vital Signs: BP (!) 150/78   Pulse 89   Temp 97.7 F (36.5 C) (Oral)   Resp 18   Ht 5\' 7"  (1.702 m)   Wt 95.7 kg   SpO2 96%    BMI 33.05 kg/m  SpO2: SpO2: 96 % O2 Device: O2 Device: Room Air O2 Flow Rate:    Intake/output summary:   Intake/Output Summary (Last 24 hours) at 10/16/2019 1047 Last data filed at 10/16/2019 0521 Gross per 24 hour  Intake --  Output 350 ml  Net -350 ml   LBM: Last BM Date:  10/14/19 Baseline Weight: Weight: 95.3 kg Most recent weight: Weight: 95.7 kg       Palliative Assessment/Data:      Patient Active Problem List   Diagnosis Date Noted  . Chest pain   . Generalized abdominal pain   . Prolonged Q-T interval on ECG 10/14/2019  . Hypomagnesemia 10/14/2019  . Cancer related pain 10/14/2019  . SIRS (systemic inflammatory response syndrome) ([place]) 10/12/2019  . Sepsis ([place]) 10/12/2019  . Tachycardia 10/12/2019  . Colitis 10/12/2019  . Hypokalemia 10/12/2019  . Palliative care encounter   . Metastatic primary lung cancer ([place]) 06/17/2019  . 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 Pulmonary embolism ([place]) 06/13/2019  . DVT (deep venous thrombosis) ([place]) 06/13/2019  . Goals of care, counseling/discussion 05/21/2019  . Metastatic adenocarcinoma to brain ([place]) 05/21/2019  . Adenocarcinoma of lung ([place]) 05/21/2019  . Essential hypertension 08/17/2018  . 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 CT scan with transverse colitis, MRI spine with increased tumor burden, stable metastatic fracture of the spine.   Assessment: Severe pain, uncontrolled at times.  Myoclonic jerks, tremor, hallucinations worse at night.  Back pain at site of T12 to S1 Electrolyte abnormalities.  PPS  40%  Recommendations/Plan: Discussed with patient and wife who is at the bedside. Also discussed with bedside RN who will start K pad today.  Continue trans dermal fentanyl as well as Fentanyl IV PRN.  Patient also on adjuvants such as Cymbalta, decadron and Toradol.  To continue trial of non opioid adjuvant Baclofen 5 mg PO TID for 6 doses and monitor. PMT to continue to follow to guide further medication adjustments.  Continue IVF, encourage PO. Patient completing treatment for possible C diff.   Discussed with wife about multi factorial etiology of patient's current presentation: uncontrolled pain, electrolyte abnormalities, dehydration, neuro excitatory effects of opioids. Offered supportive care to wife.   Goals of Care and Additional Recommendations: Limitations on Scope of Treatment: Full Scope Treatment  Code Status:    Code Status Orders  (From admission, onward)         Start     Ordered   10/12/19 2242  Full code  Continuous        10/12/19 2241        Code Status History    Date Active Date Inactive Code Status Order ID Comments User Context   06/17/2019 1740 06/28/2019 2149 Full Code [id number]  Shelly Coss, MD ED   06/13/2019 1525 06/16/2019 2244 Full Code [id number]  Georgette Shell, MD ED   Advance Care Planning Activity      Prognosis:  Unable to determine  Discharge Planning: To Be Determined  Care plan was discussed with  Patient, wife and bedside RN.   Thank you for allowing the Palliative Medicine Team to assist in the care of this patient.   Time In: 10 Time Out: 10.35 Total Time 35 Prolonged Time Billed No       Greater than 50%  of this time was spent counseling and coordinating care related to the above assessment and plan.  Loistine Chance, MD  Please contact Palliative Medicine Team  phone at [phone] for questions and concerns.

## 2019-10-16 NOTE — Progress Notes (Signed)
MD has placed order for K Pad. Portable Equipment aware and there are none available. Pt on the list for next available K Pad.

## 2019-10-16 NOTE — Progress Notes (Signed)
CRITICAL VALUE ALERT  Critical Value:  Hgb 6.4  Date & Time Notied:  10/16/19 @ 0531  Provider Notified:NP [place]  Orders Received/Actions taken: Awaiting response

## 2019-10-16 NOTE — Plan of Care (Signed)
  Problem: Clinical Measurements: Goal: Ability to maintain clinical measurements within normal limits will improve Outcome: Progressing Goal: Will remain free from infection Outcome: Progressing Goal: Respiratory complications will improve Outcome: Progressing Goal: Cardiovascular complication will be avoided Outcome: Progressing   Problem: Nutrition: Goal: Adequate nutrition will be maintained Outcome: Progressing   Problem: Elimination: Goal: Will not experience complications related to bowel motility Outcome: Progressing Goal: Will not experience complications related to urinary retention Outcome: Progressing   Problem: Pain Managment: Goal: General experience of comfort will improve Outcome: Progressing   Problem: Safety: Goal: Ability to remain free from injury will improve Outcome: Progressing   Problem: Skin Integrity: Goal: Risk for impaired skin integrity will decrease Outcome: Progressing

## 2019-10-16 NOTE — TOC Progression Note (Signed)
Transition of Care [facility]) - Progression Note    Patient Details  Name: William Ali MRN: [id number] Date of Birth: 29-Nov-1985  Transition of [facility]) CM/SW Contact  Purcell Mouton, RN Phone Number: 10/16/2019, 2:29 PM  Clinical Narrative:    Pt from home with wife. TOC will continue to follow.   Expected Discharge Plan: Home/Self Care Barriers to Discharge: No Barriers Identified  Expected Discharge Plan and Services Expected Discharge Plan: Home/Self Care       Living arrangements for the past 2 months: Single Family Home                                       Social Determinants of Health (SDOH) Interventions    Readmission Risk Interventions Readmission Risk Prevention Plan 06/19/2019 06/14/2019  Transportation Screening Complete Complete  Medication Review [employer]) Complete Complete  PCP or Specialist appointment within 3-5 days of discharge Complete Complete  HRI or [place] Complete Complete  SW Recovery Care/Counseling Consult Complete Complete  Palliative Care Screening Not Applicable Not [place] Not Applicable Not Applicable  Some recent data might be hidden

## 2019-10-16 NOTE — Progress Notes (Signed)
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 In the emergency room, tachycardic heart rate 170s, hypertensive blood pressure 174/90.  On room air.  INR 6.9.  COVID-19 negative.  CT chest negative for PE.  CT scan showed transverse colitis, C. difficile negative. MRI of the spine show increased tumor burden, stable metastatic fracture of the spine.   Assessment & Plan:   Principal Problem:   Sepsis ([place]) Active Problems:   Goals of care, counseling/discussion   Metastatic adenocarcinoma to brain [facility])   Adenocarcinoma of lung ([place])   Essential hypertension   Tachycardia   Colitis   Hypokalemia   Prolonged Q-T interval on ECG   Hypomagnesemia   Cancer related pain   Chest pain   Generalized abdominal pain  Severe sepsis present on admission, acute transverse colitis: Met sepsis criteria with tachycardia, tachypnea and lactic acid of 4.  Treated with IV fluids. Treated with broad-spectrum antibiotics for 2 days, discontinued with no source of infection.  No more diarrhea. C. difficile antigen positive, antibody negative.  GI panel negative.  Because of symptomatic disease treating with 10 days of oral vancomycin.  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  Due to persistent pain a repeat scan was done.  Duplex is negative for DVT.  CTA negative for PE.  INR 6.9 on presentation.  Xarelto was continued. Today patient with hemoglobin 6.4 and platelets 57, acute drop since last 2 days.  Hold Xarelto today.  Acute on chronic cancer related back pain: Patient continues to have intractable pain.  Mostly related to  metastatic cancer on his spine.  Difficulties with tolerating opiates and suspected causing neuropathy. Currently on fentanyl patch Fentanyl IV Toradol IV Tapered doses of dexamethasone.  Stopped. Baclofen added. Appreciate palliative care input. Some control today.  Adenocarcinoma of the lung metastasis to bones, liver, brain: Followed by oncology.  Patient on chemotherapy.  They wish to continue to treat him.  Hopefully patient will be able to have normal functional life and go back to chemotherapy.  Hypomagnesemia: Replaced.  Normalized.  Pancytopenia/anemia: Hemoglobin less than 7.  2 units transfusion ordered today.  Probably from chemotherapy.  Followed by his oncologist.  DVT prophylaxis: SCDs   Code Status: Full code Family Communication: Wife at the bedside Disposition Plan: Status is: Inpatient  Remains inpatient appropriate because:IV treatments appropriate due to intensity of illness or inability to take PO and Inpatient level of care appropriate due to severity of illness   Dispo: The patient is from: Home              Anticipated d/c is to: Home              Anticipated d/c date is: 3 days              Patient currently is not medically stable to d/c.         Consultants:   Palliative team  Oncology  Procedures:   None  Antimicrobials:  Antibiotics Given (last 72 hours)    Date/Time Action Medication Dose Rate   10/13/19 1756 New Bag/Given   piperacillin-tazobactam (ZOSYN) IVPB 3.375  g 3.375 g 12.5 mL/hr   10/13/19 2338 New Bag/Given   piperacillin-tazobactam (ZOSYN) IVPB 3.375 g 3.375 g 12.5 mL/hr   10/14/19 1024 Given   vancomycin (VANCOCIN) 50 mg/mL oral solution 125 mg 125 mg    10/14/19 1348 Given   vancomycin (VANCOCIN) 50 mg/mL oral solution 125 mg 125 mg    10/14/19 1704 Given   vancomycin (VANCOCIN) 50 mg/mL oral solution 125 mg 125 mg    10/14/19 2236 Given   vancomycin (VANCOCIN) 50 mg/mL oral solution 125 mg 125 mg    10/15/19  1114 Given   vancomycin (VANCOCIN) 50 mg/mL oral solution 125 mg 125 mg    10/15/19 1535 Given   vancomycin (VANCOCIN) 50 mg/mL oral solution 125 mg 125 mg    10/15/19 1826 Given   vancomycin (VANCOCIN) 50 mg/mL oral solution 125 mg 125 mg    10/15/19 2153 Given   vancomycin (VANCOCIN) 50 mg/mL oral solution 125 mg 125 mg    10/16/19 1058 Given   vancomycin (VANCOCIN) 50 mg/mL oral solution 125 mg 125 mg          Subjective: Patient seen and examined.  Wife stayed with him all night.  He was sleepy and did not talk.  Wife stated that late last night, he got tired and sleepy and has remained comfortable since then.  Consented for blood transfusion and started. Objective: Vitals:   10/16/19 0943 10/16/19 0950 10/16/19 1020 10/16/19 1253  BP: 125/84 138/86 (!) 150/78 (!) 142/87  Pulse: 90 91 89 88  Resp:  _0 Temp:  97.7 F (36.5 C) 97.7 F (36.5 C) 97.9 F (36.6 C)  TempSrc:  Oral Oral Oral  SpO2:  98% 96% 100%  Weight:      Height:        Intake/Output Summary (Last 24 hours) at 10/16/2019 1313 Last data filed at 10/16/2019 1253 Gross per 24 hour  Intake 340.42 ml  Output 600 ml  Net -259.58 ml   Filed Weights   10/12/19 1541 10/13/19 1611  Weight: 95.3 kg 95.7 kg    Examination:  General exam: Appears sleepy and looks comfortable.  Did not communicate today. Respiratory system: Clear to auscultation. Respiratory effort normal.  No added sounds.  Port-A-Cath present on the right chest. Cardiovascular system: S1 & S2 heard, RRR. No JVD, murmurs, rubs, gallops or clicks.  1+ bilateral pedal edema, some tenderness along the left calf. Gastrointestinal system: Abdomen distended but nontender.  Bowel sounds present. Central nervous system: Alert and oriented. No focal neurological deficits.  Generalized weakness. Extremities: Symmetric 5 x 5 power. Skin: No rashes, lesions or ulcers Psychiatry: Judgement and insight appear normal.    Data Reviewed: I have  personally reviewed following labs and imaging studies  CBC: Recent Labs  Lab 10/12/19 1147 10/13/19 1115 10/14/19 0838 10/15/19 0419 10/16/19 0353  WBC 6.1 4.0 2.4* 2.6* 3.0*  NEUTROABS  --  3.7 2.0 2.2 2.4  HGB 8.1* 7.4* 7.4* 7.7* 6.4*  HCT 27.8* 24.2* 25.4* 26.0* 20.9*  MCV 100.4* 99.2 99.2 99.2 97.2  PLT 124* 124* 103* 88* 57*   Basic Metabolic Panel: Recent Labs  Lab 10/12/19 1149 10/13/19 1115 10/14/19 0838 10/15/19 0419 10/16/19 0353  NA 140 141 140 140 140  K 3.2* 3.5 3.8 4.1 4.1  CL 107 103 104 104 106  CO2 20* _1 GLUCOSE 170* 147* 106* 133* 119*  BUN _2 CREATININE 1.16 1.13  0.95 1.00 0.99  CALCIUM 7.1* 7.5* 7.9* 8.8* 8.3*  MG  --  1.2*  --  2.2  --   PHOS  --  2.8  --   --   --    GFR: Estimated Creatinine Clearance: 114.8 mL/min (by C-G formula based on SCr of 0.99 mg/dL). Liver Function Tests: Recent Labs  Lab 10/12/19 1149 10/14/19 0838 10/15/19 0419 10/16/19 0353  AST 120* 109* 129* 174*  ALT 118* 105* 108* 115*  ALKPHOS 401* 359* 388* 331*  BILITOT 1.5* 1.3* 1.1 0.9  PROT 5.8* 5.7* 6.4* 5.5*  ALBUMIN 2.7* 2.6* 2.7* 2.5*   Recent Labs  Lab 10/12/19 1149  LIPASE 19   No results for input(s): AMMONIA in the last 168 hours. Coagulation Profile: Recent Labs  Lab 10/12/19 1148 10/13/19 0430  INR 6.9* 2.7*   Cardiac Enzymes: No results for input(s): CKTOTAL, CKMB, CKMBINDEX, TROPONINI in the last 168 hours. BNP (last 3 results) No results for input(s): PROBNP in the last 8760 hours. HbA1C: No results for input(s): HGBA1C in the last 72 hours. CBG: Recent Labs  Lab 10/15/19 0440  GLUCAP 135*   Lipid Profile: No results for input(s): CHOL, HDL, LDLCALC, TRIG, CHOLHDL, LDLDIRECT in the last 72 hours. Thyroid Function Tests: No results for input(s): TSH, T4TOTAL, FREET4, T3FREE, THYROIDAB in the last 72 hours. Anemia Panel: No results for input(s): VITAMINB12, FOLATE, FERRITIN, TIBC, IRON, RETICCTPCT in the last  72 hours. Sepsis Labs: Recent Labs  Lab 10/12/19 1148 10/12/19 1255 10/13/19 1115  LATICACIDVEN 4.0* 2.1* 1.8    Recent Results (from the past 240 hour(s))  Culture, blood (routine x 2)     Status: None (Preliminary result)   Collection Time: 10/12/19 11:49 AM   Specimen: BLOOD  Result Value Ref Range Status   Specimen Description   Final    BLOOD PORTA CATH Performed at [place] [address], [place], [place] [address]    Special Requests   Final    BOTTLES DRAWN AEROBIC AND ANAEROBIC Blood Culture adequate volume Performed at [facility] [address] [place], [place] [address]    Culture   Final    NO GROWTH 3 DAYS Performed at [facility], [place] [address] [place], [place] [address]    Report Status PENDING  Incomplete  Culture, blood (routine x 2)     Status: None (Preliminary result)   Collection Time: 10/12/19 11:50 AM   Specimen: BLOOD  Result Value Ref Range Status   Specimen Description   Final    BLOOD RIGHT ANTECUBITAL Performed at [place] [address] [place], [place] [address]    Special Requests   Final    BOTTLES DRAWN AEROBIC AND ANAEROBIC Blood Culture adequate volume Performed at [place] [address] [place], [place] [address]    Culture   Final    NO GROWTH 3 DAYS Performed at [facility], [place] [address], [place], [place] [address]    Report Status PENDING  Incomplete  SARS Coronavirus 2 by RT PCR (hospital order, performed in [facility] hospital lab) Nasopharyngeal Nasopharyngeal Swab     Status: None   Collection Time: 10/12/19 11:50 AM   Specimen: Nasopharyngeal Swab  Result Value Ref Range Status   SARS Coronavirus 2 NEGATIVE NEGATIVE Final    Comment: (NOTE) SARS-CoV-2 target nucleic acids are NOT DETECTED.  The SARS-CoV-2 RNA is generally detectable in upper and lower respiratory  specimens during the acute phase of  infection. The lowest concentration of SARS-CoV-2 viral copies this assay can detect is 250 copies / mL. A negative result does not preclude SARS-CoV-2 infection and should not be used as the sole basis for treatment or other patient management decisions.  A negative result may occur with improper specimen collection / handling, submission of specimen other than nasopharyngeal swab, presence of viral mutation(s) within the areas targeted by this assay, and inadequate number of viral copies (<250 copies / mL). A negative result must be combined with clinical observations, patient history, and epidemiological information.  Fact Sheet for Patients:   StrictlyIdeas.no  Fact Sheet for Healthcare Providers: BankingDealers.co.za  This test is not yet approved or  cleared by the [place] FDA and has been authorized for detection and/or diagnosis of SARS-CoV-2 by FDA under an Emergency Use Authorization (EUA).  This EUA will remain in effect (meaning this test can be used) for the duration of the COVID-19 declaration under Section 564(b)(1) of the Act, 21 U.S.C. section 360bbb-3(b)(1), unless the authorization is terminated or revoked sooner.  Performed at [facility], [place] [address], [place], [place] [address]   Gastrointestinal Panel by PCR , Stool     Status: None   Collection Time: 10/13/19  4:15 PM   Specimen: STOOL  Result Value Ref Range Status   Campylobacter species NOT DETECTED NOT DETECTED Final   Plesimonas shigelloides NOT DETECTED NOT DETECTED Final   Salmonella species NOT DETECTED NOT DETECTED Final   Yersinia enterocolitica NOT DETECTED NOT DETECTED Final   Vibrio species NOT DETECTED NOT DETECTED Final   Vibrio cholerae NOT DETECTED NOT DETECTED Final   Enteroaggregative E coli (EAEC) NOT DETECTED NOT DETECTED Final   Enteropathogenic E coli (EPEC) NOT DETECTED NOT DETECTED Final   Enterotoxigenic  E coli (ETEC) NOT DETECTED NOT DETECTED Final   Shiga like toxin producing E coli (STEC) NOT DETECTED NOT DETECTED Final   Shigella/Enteroinvasive E coli (EIEC) NOT DETECTED NOT DETECTED Final   Cryptosporidium NOT DETECTED NOT DETECTED Final   Cyclospora cayetanensis NOT DETECTED NOT DETECTED Final   Entamoeba histolytica NOT DETECTED NOT DETECTED Final   Giardia lamblia NOT DETECTED NOT DETECTED Final   Adenovirus F40/41 NOT DETECTED NOT DETECTED Final   Astrovirus NOT DETECTED NOT DETECTED Final   Norovirus GI/GII NOT DETECTED NOT DETECTED Final   Rotavirus A NOT DETECTED NOT DETECTED Final   Sapovirus (I, II, IV, and V) NOT DETECTED NOT DETECTED Final    Comment: Performed at [facility], [address] [place], [place] [address]  C Difficile Quick Screen w PCR reflex     Status: Abnormal   Collection Time: 10/13/19  4:15 PM   Specimen: STOOL  Result Value Ref Range Status   C Diff antigen POSITIVE (A) NEGATIVE Final   C Diff toxin NEGATIVE NEGATIVE Final   C Diff interpretation Results are indeterminate. See PCR results.  Final    Comment: Performed at [facility], [place] [address], [place], [place] [address]  C. Diff by PCR, Reflexed     Status: Abnormal   Collection Time: 10/13/19  4:15 PM  Result Value Ref Range Status   Toxigenic C. Difficile by PCR POSITIVE (A) NEGATIVE Final    Comment: Positive for toxigenic C. difficile with little to no toxin production. Only treat if clinical presentation suggests symptomatic illness. Performed at [facility], [place] [address] [place], [place] [address]  MRSA PCR Screening     Status: None   Collection Time: 10/14/19  2:05 AM   Specimen: Nasopharyngeal  Result Value Ref Range Status   MRSA by PCR NEGATIVE NEGATIVE Final    Comment:        The GeneXpert MRSA Assay (FDA approved for NASAL specimens only), is one component of a comprehensive MRSA colonization surveillance program. It is  not intended to diagnose MRSA infection nor to guide or monitor treatment for MRSA infections. Performed at [facility], [place] [address], [place], [place] [address]          Radiology Studies: MR THORACIC SPINE WO CONTRAST  Result Date: 10/14/2019 CLINICAL DATA:  34 year old male with metastatic lung cancer. History of T3 pathologic fracture and multilevel thoracic ventral epidural tumor in May. Back pain. EXAM: MRI THORACIC SPINE WITHOUT CONTRAST TECHNIQUE: Multiplanar, multisequence MR imaging of the thoracic spine was performed. No intravenous contrast was administered. COMPARISON:  Thoracic MRI 06/17/2019. FINDINGS: The examination had to be discontinued prior to completion due to pain. Only one set of motion degraded axial T2 weighted images could be obtained. Limited cervical spine imaging: Subtotal involvement of the C7 vertebra by tumor. Preserved cervicothoracic junction alignment. Thoracic spine segmentation: Appears to be normal, same numbering system used in May. Alignment:  Stable since May. Vertebrae: Diffuse thoracic bone metastases, with subtotal to complete vertebral infiltration redemonstrated at T1, T3, T6. Multilevel posterior element involvement, worst at T5 as before. Stable T3 vertebral body pathologic compression fracture. No new pathologic fracture identified in the thoracic spine. No areas of progressed marrow edema since May. Cord: No thoracic spinal cord edema has developed since May. Stable noncontrast appearance of the cord. Conus medullaris occurs below T12-L1. Paraspinal and other soft tissues: Small right pleural effusion has increased. Otherwise grossly stable visible thoracic and upper abdominal viscera. Thoracic paraspinal soft tissues have not significantly changed, including some bilateral erector spinae muscle edema related to posterior element tumor involvement. Disc levels: Motion degraded axial images today such that epidural extension of  tumor and mild spinal stenosis can only be discerned from sagittal sequences. Within that limitation, the epidural tumor extension and mild spinal stenosis at T3, T5, T6, T7, and T9 appears stable since May. New mild ventral epidural tumor is suspected at T11 now, with new mild spinal stenosis there. Epidural tumor involving the bilateral T5 neural foramina has increased, but is stable at the right T6 neural foramen. IMPRESSION: 1. Largely stable diffuse thoracic spine metastases since the noncontrast MRI in May. 2. New mild epidural tumor and mild spinal stenosis at the T11 level. Increased epidural tumor in the bilateral T5 neural foramina, stable at the right T6 foramen. Stable epidural extension and mild spinal stenosis at T3, T5, T6, T7, and T9. No associated spinal cord edema. 3. Stable T3 pathologic fracture.  No new pathologic fracture. 4. Layering right pleural effusion appears increased. Electronically Signed   By: Genevie Ann M.D.   On: 10/14/2019 15:57   MR LUMBAR SPINE WO CONTRAST  Result Date: 10/14/2019 CLINICAL DATA:  34 year old male with metastatic lung cancer. History of T3 pathologic fracture and multilevel thoracic ventral epidural tumor in May. Back pain. EXAM: MRI LUMBAR SPINE WITHOUT CONTRAST TECHNIQUE: Multiplanar, multisequence MR imaging of the lumbar spine was performed. No intravenous contrast was administered. COMPARISON:  Thoracic spine MRI today reported separately. Lumbar MRI 06/17/2019. FINDINGS: Segmentation: Normal, concordant with the thoracic spine numbering today. Alignment:  Stable since May. Vertebrae: Diffuse lumbar vertebral  metastatic disease again noted. Near complete tumor infiltration of the L2 vertebra as before. Increased L3 vertebral body tumor since May. Extensive left L1 and right L4 vertebral tumor appears stable. L5 involvement appears stable. Diffuse tumor infiltration of the left sacral ala and central lower sacral segments appears stable. Mild pathologic  fracture of the L2 superior endplate is stable. No new pathologic fracture. Conus medullaris and cauda equina: Conus extends to the L1 level. No signal abnormality in the visible lower thoracic cord or conus. Paraspinal and other soft tissues: Stable since May. Negative visible abdominal viscera. Mild lumbar paraspinal muscle edema related to tumor. Disc levels: New ventral epidural tumor at both L1 and L2, with mild new spinal stenosis at both levels. No foraminal tumor at this time. Possible early right ventral epidural tumor now at L3, although new spinal stenosis there appears more related to increased epidural lipomatosis. Mildly increased epidural lipomatosis and associated spinal stenosis also at L4 and L5. Superimposed lumbar spine degeneration, maximal at L3-L4, is stable. IMPRESSION: 1. Diffuse lumbosacral metastatic disease re-demonstrated. Stable mild L2 pathologic fracture. No new pathologic fracture. 2. Increased tumor infiltration of the L3 vertebra since May. Mild new ventral epidural tumor at L1, L2, and probably also L3. New mild spinal stenosis at those levels. 3. Increased lumbar spinal stenosis elsewhere related to interval increased epidural lipomatosis. Electronically Signed   By: Genevie Ann M.D.   On: 10/14/2019 16:06        Scheduled Meds: . sodium chloride   Intravenous Once  . baclofen  5 mg Oral TID  . Chlorhexidine Gluconate Cloth  6 each Topical Daily  . DULoxetine  60 mg Oral Daily  . fentaNYL  1 patch Transdermal Q72H  . folic acid  1 mg Oral Daily  . lidocaine  1 patch Transdermal Q24H  . metoprolol succinate  50 mg Oral Daily  . senna  1 tablet Oral QHS  . sodium chloride flush  10-40 mL Intracatheter Q12H  . sodium chloride flush  3 mL Intravenous Q12H  . vancomycin  125 mg Oral QID   Continuous Infusions: . sodium chloride 50 mL/hr at 10/16/19 0511  . lactated ringers Stopped (10/13/19 1105)     LOS: 4 days    Time spent: 30 minutes    Barb Merino,  MD [facility] Pager [phone]

## 2019-10-17 LAB — TYPE AND SCREEN
ABO/RH(D): O POS
Antibody Screen: NEGATIVE
Unit division: 0
Unit division: 0

## 2019-10-17 LAB — CULTURE, BLOOD (ROUTINE X 2)
Culture: NO GROWTH
Culture: NO GROWTH
Special Requests: ADEQUATE
Special Requests: ADEQUATE

## 2019-10-17 LAB — BPAM RBC
Blood Product Expiration Date: 202110082359
Blood Product Expiration Date: 202110192359
ISSUE DATE / TIME: 202109201000
ISSUE DATE / TIME: 202109201323
Unit Type and Rh: 5100
Unit Type and Rh: 5100

## 2019-10-17 LAB — CBC WITH DIFFERENTIAL/PLATELET
Abs Immature Granulocytes: 0.25 10*3/uL — ABNORMAL HIGH (ref 0.00–0.07)
Basophils Absolute: 0 10*3/uL (ref 0.0–0.1)
Basophils Relative: 1 %
Eosinophils Absolute: 0 10*3/uL (ref 0.0–0.5)
Eosinophils Relative: 0 %
HCT: 27.9 % — ABNORMAL LOW (ref 39.0–52.0)
Hemoglobin: 8.9 g/dL — ABNORMAL LOW (ref 13.0–17.0)
Immature Granulocytes: 6 %
Lymphocytes Relative: 6 %
Lymphs Abs: 0.3 10*3/uL — ABNORMAL LOW (ref 0.7–4.0)
MCH: 29 pg (ref 26.0–34.0)
MCHC: 31.9 g/dL (ref 30.0–36.0)
MCV: 90.9 fL (ref 80.0–100.0)
Monocytes Absolute: 0.8 10*3/uL (ref 0.1–1.0)
Monocytes Relative: 19 %
Neutro Abs: 2.8 10*3/uL (ref 1.7–7.7)
Neutrophils Relative %: 68 %
Platelets: 61 10*3/uL — ABNORMAL LOW (ref 150–400)
RBC: 3.07 MIL/uL — ABNORMAL LOW (ref 4.22–5.81)
RDW: 20.6 % — ABNORMAL HIGH (ref 11.5–15.5)
WBC: 4.1 10*3/uL (ref 4.0–10.5)
nRBC: 3.9 % — ABNORMAL HIGH (ref 0.0–0.2)

## 2019-10-17 NOTE — Plan of Care (Signed)
Patient sleepy on 7 a to 7 p shift but easily awakened and is oriented x4.  One soft formed stool on  this shift, tolerating diet without nausea.  Mother at bedside.

## 2019-10-17 NOTE — Progress Notes (Signed)
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 In the emergency room, tachycardic heart rate 170s, hypertensive blood pressure 174/90.  On room air.  INR 6.9.  COVID-19 negative.  CT chest negative for PE.  CT scan showed transverse colitis, C. difficile negative. MRI of the spine show increased tumor burden, stable metastatic fracture of the spine.   Assessment & Plan:   Principal Problem:   Sepsis ([place]) Active Problems:   Goals of care, counseling/discussion   Metastatic adenocarcinoma to brain [facility])   Adenocarcinoma of lung ([place])   Essential hypertension   Tachycardia   Colitis   Hypokalemia   Prolonged Q-T interval on ECG   Hypomagnesemia   Cancer related pain   Chest pain   Generalized abdominal pain  Severe sepsis present on admission, acute transverse colitis: Met sepsis criteria with tachycardia, tachypnea and lactic acid of 4.  Treated with IV fluids. Treated with broad-spectrum antibiotics for 2 days, discontinued with no source of infection.  No more diarrhea. C. difficile antigen positive, antibody negative.  GI panel negative.  Because of symptomatic disease treating with 10 days of oral vancomycin.  Day 4/10.  Tolerating.  No more diarrhea.  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  Due to persistent pain a repeat scan was done.  Duplex is negative for DVT.  CTA negative for PE.  INR 6.9 on presentation.  Xarelto was continued. 9/20, Xarelto on hold with acute drop in hemoglobin and platelets.  We will recheck platelets tomorrow, if trending up, will resume Xarelto.  Acute on chronic cancer  related back pain: Patient continues to have intractable pain.  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  Difficulties with tolerating opiates and suspected causing neuropathy. Currently on fentanyl patch Fentanyl IV Toradol IV Tapered doses of dexamethasone.  Stopped. Baclofen added. Appreciate palliative care input. Improved symptoms today.  Is still receiving IV fentanyl and Toradol. Hopefully, we can change him to oral regimen to prepare for discharge.  Adenocarcinoma of the lung metastasis to bones, liver, brain: Followed by oncology.  Patient on chemotherapy.  They wish to continue to treat him.  Hopefully patient will be able to have normal functional life and go back to chemotherapy.  Hypomagnesemia: Replaced.  Normalized.  Pancytopenia/anemia: Hemoglobin was less than 7.  He received 2 units transfusion with appropriate response.  Platelets improved however he still low.  DVT prophylaxis: SCDs,    Code Status: Full code Family Communication: Mother at the bedside. Disposition Plan: Status is: Inpatient  Remains inpatient appropriate because:IV treatments appropriate due to intensity of illness or inability to take PO and Inpatient level of care appropriate due to severity of illness   Dispo: The patient is from: Home              Anticipated d/c is to: Home              Anticipated d/c date is: 1 to 2 days.              Patient currently is not medically stable to d/c. Still with significant symptoms controlled with IV medications.  Will need symptom control with oral medications before discharge.        Consultants:   Palliative team  Oncology  Procedures:   None  Antimicrobials:  Antibiotics Given (last 72 hours)    Date/Time Action Medication Dose   10/14/19 1348 Given   vancomycin (VANCOCIN) 50 mg/mL oral solution 125 mg 125 mg   10/14/19 1704 Given   vancomycin (VANCOCIN) 50 mg/mL oral solution 125 mg 125 mg   10/14/19 2236 Given    vancomycin (VANCOCIN) 50 mg/mL oral solution 125 mg 125 mg   10/15/19 1114 Given   vancomycin (VANCOCIN) 50 mg/mL oral solution 125 mg 125 mg   10/15/19 1535 Given   vancomycin (VANCOCIN) 50 mg/mL oral solution 125 mg 125 mg   10/15/19 1826 Given   vancomycin (VANCOCIN) 50 mg/mL oral solution 125 mg 125 mg   10/15/19 2153 Given   vancomycin (VANCOCIN) 50 mg/mL oral solution 125 mg 125 mg   10/16/19 1058 Given   vancomycin (VANCOCIN) 50 mg/mL oral solution 125 mg 125 mg   10/16/19 1338 Given   vancomycin (VANCOCIN) 50 mg/mL oral solution 125 mg 125 mg   10/16/19 1659 Given   vancomycin (VANCOCIN) 50 mg/mL oral solution 125 mg 125 mg   10/16/19 2107 Given   vancomycin (VANCOCIN) 50 mg/mL oral solution 125 mg 125 mg   10/17/19 1035 Given   vancomycin (VANCOCIN) 50 mg/mL oral solution 125 mg 125 mg         Subjective: Patient seen and examined.  Mother was at the bedside.  She stayed overnight.  Patient was sleeping.  He slept well last night after a dose of fentanyl IV. Had some abdominal discomfort earlier, after passing large amount of flatus that improved. Objective: Vitals:   10/16/19 1346 10/16/19 1604 10/16/19 2107 10/17/19 0415  BP: (!) 146/93 (!) 152/84 (!) 145/90 (!) 147/82  Pulse: 87 99 94 99  Resp: 18 20 (!) 24 20  Temp: 98.4 F (36.9 C) 97.9 F (36.6 C) 97.7 F (36.5 C) 97.8 F (36.6 C)  TempSrc: Oral Oral Oral Oral  SpO2: 100% 100% 99% 100%  Weight:      Height:        Intake/Output Summary (Last 24 hours) at 10/17/2019 1153 Last data filed at 10/17/2019 0748 Gross per 24 hour  Intake 1255.42 ml  Output 750 ml  Net 505.42 ml   Filed Weights   10/12/19 1541 10/13/19 1611  Weight: 95.3 kg 95.7 kg    Examination:  General exam: Appears sleepy and looks comfortable.  Chronically sick looking.  Not in any distress. Respiratory system: Clear to auscultation. Respiratory effort normal.  No added sounds.  Port-A-Cath present on the right  chest. Cardiovascular system: S1 & S2 heard, RRR. No JVD, murmurs, rubs, gallops or clicks.  1+ bilateral pedal edema, Gastrointestinal system: Abdomen distended but nontender.  Bowel sounds present. Central nervous system: Alert and oriented. No focal neurological deficits.  Generalized weakness. Extremities: Symmetric 5 x 5 power. Skin: No rashes, lesions or ulcers Psychiatry: Judgement and insight appear normal.    Data Reviewed: I have personally reviewed following labs and imaging studies  CBC: Recent Labs  Lab 10/13/19 1115 10/13/19 1115 10/14/19 0838 10/15/19 0419 10/16/19 0353 10/16/19 2000 10/17/19 0836  WBC 4.0  --  2.4* 2.6* 3.0*  --  4.1  NEUTROABS 3.7  --  2.0 2.2 2.4  --  2.8  HGB 7.4*   < > 7.4* 7.7* 6.4* 9.2* 8.9*  HCT 24.2*   < > 25.4* 26.0* 20.9* 28.6* 27.9*  MCV 99.2  --  99.2 99.2 97.2  --  90.9  PLT 124*  --  103* 88* 57*  --  61*   < > = values in this interval not displayed.   Basic Metabolic Panel: Recent Labs  Lab 10/12/19 1149 10/13/19 1115 10/14/19 0838 10/15/19 0419 10/16/19 0353  NA 140 141 140 140 140  K 3.2* 3.5 3.8 4.1 4.1  CL 107 103 104 104 106  CO2 20* 24 25 24 24   GLUCOSE 170* 147* 106* 133* 119*  BUN 9 8 8 12 16   CREATININE 1.16 1.13 0.95 1.00 0.99  CALCIUM 7.1* 7.5* 7.9* 8.8* 8.3*  MG  --  1.2*  --  2.2  --   PHOS  --  2.8  --   --   --    GFR: Estimated Creatinine Clearance: 114.8 mL/min (by C-G formula based on SCr of 0.99 mg/dL). Liver Function Tests: Recent Labs  Lab 10/12/19 1149 10/14/19 0838 10/15/19 0419 10/16/19 0353  AST 120* 109* 129* 174*  ALT 118* 105* 108* 115*  ALKPHOS 401* 359* 388* 331*  BILITOT 1.5* 1.3* 1.1 0.9  PROT 5.8* 5.7* 6.4* 5.5*  ALBUMIN 2.7* 2.6* 2.7* 2.5*   Recent Labs  Lab 10/12/19 1149  LIPASE 19   No results for input(s): AMMONIA in the last 168 hours. Coagulation Profile: Recent Labs  Lab 10/12/19 1148 10/13/19 0430  INR 6.9* 2.7*   Cardiac Enzymes: No results for  input(s): CKTOTAL, CKMB, CKMBINDEX, TROPONINI in the last 168 hours. BNP (last 3 results) No results for input(s): PROBNP in the last 8760 hours. HbA1C: No results for input(s): HGBA1C in the last 72 hours. CBG: Recent Labs  Lab 10/15/19 0440  GLUCAP 135*   Lipid Profile: No results for input(s): CHOL, HDL, LDLCALC, TRIG, CHOLHDL, LDLDIRECT in the last 72 hours. Thyroid Function Tests: No results for input(s): TSH, T4TOTAL, FREET4, T3FREE, THYROIDAB in the last 72 hours. Anemia Panel: No results for input(s): VITAMINB12, FOLATE, FERRITIN, TIBC, IRON, RETICCTPCT in the last 72 hours. Sepsis Labs: Recent Labs  Lab 10/12/19 1148 10/12/19 1255 10/13/19 1115  LATICACIDVEN 4.0* 2.1* 1.8    Recent Results (from the past 240 hour(s))  Culture, blood (routine x 2)     Status: None (Preliminary result)   Collection Time: 10/12/19 11:49 AM   Specimen: BLOOD  Result Value Ref Range Status   Specimen Description   Final    BLOOD PORTA CATH Performed at [facility] [address], [place], [place] [address]    Special Requests   Final    BOTTLES DRAWN AEROBIC AND ANAEROBIC Blood Culture adequate volume Performed at [facility] [address], [place], [place] [address]    Culture   Final    NO GROWTH 4 DAYS Performed at [facility], [place] [address] [place], [place] [address]    Report Status PENDING  Incomplete  Culture, blood (routine x 2)     Status: None (Preliminary result)   Collection Time: 10/12/19 11:50 AM   Specimen: BLOOD  Result Value Ref Range Status   Specimen Description   Final    BLOOD RIGHT ANTECUBITAL Performed at [facility] [address], [place], [place] [address]    Special Requests   Final    BOTTLES DRAWN AEROBIC AND ANAEROBIC Blood Culture adequate volume Performed at [facility]  [address] [address] [place], [place] [address]    Culture   Final    NO GROWTH 4  DAYS Performed at [facility], [place] [address], [place], [place] [address]    Report Status PENDING  Incomplete  SARS Coronavirus 2 by RT PCR (hospital order, performed in [facility] hospital lab) Nasopharyngeal Nasopharyngeal Swab     Status: None   Collection Time: 10/12/19 11:50 AM   Specimen: Nasopharyngeal Swab  Result Value Ref Range Status   SARS Coronavirus 2 NEGATIVE NEGATIVE Final    Comment: (NOTE) SARS-CoV-2 target nucleic acids are NOT DETECTED.  The SARS-CoV-2 RNA is generally detectable in upper and lower respiratory specimens during the acute phase of infection. The lowest concentration of SARS-CoV-2 viral copies this assay can detect is 250 copies / mL. A negative result does not preclude SARS-CoV-2 infection and should not be used as the sole basis for treatment or other patient management decisions.  A negative result may occur with improper specimen collection / handling, submission of specimen other than nasopharyngeal swab, presence of viral mutation(s) within the areas targeted by this assay, and inadequate number of viral copies (<250 copies / mL). A negative result must be combined with clinical observations, patient history, and epidemiological information.  Fact Sheet for Patients:   StrictlyIdeas.no  Fact Sheet for Healthcare Providers: BankingDealers.co.za  This test is not yet approved or  cleared by the [place] FDA and has been authorized for detection and/or diagnosis of SARS-CoV-2 by FDA under an Emergency Use Authorization (EUA).  This EUA will remain in effect (meaning this test can be used) for the duration of the COVID-19 declaration under Section 564(b)(1) of the Act, 21 U.S.C. section 360bbb-3(b)(1), unless the authorization is terminated or revoked sooner.  Performed at [facility], [place] [address] [place],  [address]   Gastrointestinal Panel by PCR ,  Stool     Status: None   Collection Time: 10/13/19  4:15 PM   Specimen: STOOL  Result Value Ref Range Status   Campylobacter species NOT DETECTED NOT DETECTED Final   Plesimonas shigelloides NOT DETECTED NOT DETECTED Final   Salmonella species NOT DETECTED NOT DETECTED Final   Yersinia enterocolitica NOT DETECTED NOT DETECTED Final   Vibrio species NOT DETECTED NOT DETECTED Final   Vibrio cholerae NOT DETECTED NOT DETECTED Final   Enteroaggregative E coli (EAEC) NOT DETECTED NOT DETECTED Final   Enteropathogenic E coli (EPEC) NOT DETECTED NOT DETECTED Final   Enterotoxigenic E coli (ETEC) NOT DETECTED NOT DETECTED Final   Shiga like toxin producing E coli (STEC) NOT DETECTED NOT DETECTED Final   Shigella/Enteroinvasive E coli (EIEC) NOT DETECTED NOT DETECTED Final   Cryptosporidium NOT DETECTED NOT DETECTED Final   Cyclospora cayetanensis NOT DETECTED NOT DETECTED Final   Entamoeba histolytica NOT DETECTED NOT DETECTED Final   Giardia lamblia NOT DETECTED NOT DETECTED Final   Adenovirus F40/41 NOT DETECTED NOT DETECTED Final   Astrovirus NOT DETECTED NOT DETECTED Final   Norovirus GI/GII NOT DETECTED NOT DETECTED Final   Rotavirus A NOT DETECTED NOT DETECTED Final   Sapovirus (I, II, IV, and V) NOT DETECTED NOT DETECTED Final    Comment: Performed at [facility], [address] [place], [place] [address]  C Difficile Quick Screen w PCR reflex     Status: Abnormal   Collection Time: 10/13/19  4:15 PM   Specimen: STOOL  Result Value Ref Range Status   C Diff antigen POSITIVE (A) NEGATIVE Final  C Diff toxin NEGATIVE NEGATIVE Final   C Diff interpretation Results are indeterminate. See PCR results.  Final    Comment: Performed at [facility], [place] [address] [place], [place] [address]  C. Diff by PCR, Reflexed     Status: Abnormal   Collection Time: 10/13/19  4:15 PM  Result Value Ref Range Status   Toxigenic C. Difficile by PCR POSITIVE (A) NEGATIVE  Final    Comment: Positive for toxigenic C. difficile with little to no toxin production. Only treat if clinical presentation suggests symptomatic illness. Performed at [facility], [place] [address] [place], [place] [address]   MRSA PCR Screening     Status: None   Collection Time: 10/14/19  2:05 AM   Specimen: Nasopharyngeal  Result Value Ref Range Status   MRSA by PCR NEGATIVE NEGATIVE Final    Comment:        The GeneXpert MRSA Assay (FDA approved for NASAL specimens only), is one component of a comprehensive MRSA colonization surveillance program. It is not intended to diagnose MRSA infection nor to guide or monitor treatment for MRSA infections. Performed at [facility], [place] [address], [place], [place] [address]          Radiology Studies: No results found.      Scheduled Meds: . sodium chloride   Intravenous Once  . Chlorhexidine Gluconate Cloth  6 each Topical Daily  . DULoxetine  60 mg Oral Daily  . fentaNYL  1 patch Transdermal Q72H  . folic acid  1 mg Oral Daily  . lidocaine  1 patch Transdermal Q24H  . metoprolol succinate  50 mg Oral Daily  . senna  1 tablet Oral QHS  . sodium chloride flush  10-40 mL Intracatheter Q12H  . sodium chloride flush  3 mL Intravenous Q12H  . vancomycin  125 mg Oral QID   Continuous Infusions: . sodium chloride 50 mL/hr at 10/17/19 0743  . lactated ringers Stopped (10/13/19 1105)     LOS: 5 days    Time spent: 30 minutes    Barb Merino, MD [facility] Pager [phone]

## 2019-10-17 NOTE — Plan of Care (Signed)

## 2019-10-18 LAB — CBC WITH DIFFERENTIAL/PLATELET
Abs Immature Granulocytes: 0.29 10*3/uL — ABNORMAL HIGH (ref 0.00–0.07)
Basophils Absolute: 0 10*3/uL (ref 0.0–0.1)
Basophils Relative: 1 %
Eosinophils Absolute: 0 10*3/uL (ref 0.0–0.5)
Eosinophils Relative: 1 %
HCT: 25.7 % — ABNORMAL LOW (ref 39.0–52.0)
Hemoglobin: 7.9 g/dL — ABNORMAL LOW (ref 13.0–17.0)
Immature Granulocytes: 8 %
Lymphocytes Relative: 5 %
Lymphs Abs: 0.2 10*3/uL — ABNORMAL LOW (ref 0.7–4.0)
MCH: 28.3 pg (ref 26.0–34.0)
MCHC: 30.7 g/dL (ref 30.0–36.0)
MCV: 92.1 fL (ref 80.0–100.0)
Monocytes Absolute: 0.9 10*3/uL (ref 0.1–1.0)
Monocytes Relative: 26 %
Neutro Abs: 2.2 10*3/uL (ref 1.7–7.7)
Neutrophils Relative %: 59 %
Platelets: 49 10*3/uL — ABNORMAL LOW (ref 150–400)
RBC: 2.79 MIL/uL — ABNORMAL LOW (ref 4.22–5.81)
RDW: 20.4 % — ABNORMAL HIGH (ref 11.5–15.5)
WBC: 3.6 10*3/uL — ABNORMAL LOW (ref 4.0–10.5)
nRBC: 3.3 % — ABNORMAL HIGH (ref 0.0–0.2)

## 2019-10-18 LAB — BASIC METABOLIC PANEL
Anion gap: 8 (ref 5–15)
BUN: 11 mg/dL (ref 6–20)
CO2: 24 mmol/L (ref 22–32)
Calcium: 7.7 mg/dL — ABNORMAL LOW (ref 8.9–10.3)
Chloride: 111 mmol/L (ref 98–111)
Creatinine, Ser: 0.98 mg/dL (ref 0.61–1.24)
GFR calc Af Amer: >60 mL/min (ref >60)
GFR calc non Af Amer: >60 mL/min (ref >60)
Glucose, Bld: 95 mg/dL (ref 70–99)
Potassium: 3.2 mmol/L — ABNORMAL LOW (ref 3.5–5.1)
Sodium: 143 mmol/L (ref 135–145)

## 2019-10-18 LAB — PHOSPHORUS: Phosphorus: 3.3 mg/dL (ref 2.5–4.6)

## 2019-10-18 LAB — MAGNESIUM: Magnesium: 1.7 mg/dL (ref 1.7–2.4)

## 2019-10-18 MED ORDER — ENSURE ENLIVE PO LIQD: 237.0000 mL | Freq: Two times a day (BID) | ORAL | Status: DC

## 2019-10-18 MED ORDER — FENTANYL CITRATE (PF) 100 MCG/2ML IJ SOLN
50.0000 ug | INTRAMUSCULAR | Status: DC | PRN
  Administered 2019-10-18 – 2019-10-19 (×5): 50 ug via INTRAVENOUS
  Filled 2019-10-18 (×4): qty 2

## 2019-10-18 MED ORDER — OXYCODONE HCL 5 MG/5ML PO SOLN
10.0000 mg | Freq: Four times a day (QID) | ORAL | Status: DC | PRN
  Administered 2019-10-18 – 2019-10-19 (×4): 10 mg via ORAL
  Filled 2019-10-18 (×4): qty 10

## 2019-10-18 MED ORDER — POTASSIUM CHLORIDE CRYS ER 20 MEQ PO TBCR
40.0000 meq | EXTENDED_RELEASE_TABLET | ORAL | Status: AC
  Administered 2019-10-18 (×2): 40 meq via ORAL
  Filled 2019-10-18 (×2): qty 2

## 2019-10-18 MED ORDER — FENTANYL 75 MCG/HR TD PT72
1.0000 | MEDICATED_PATCH | TRANSDERMAL | Status: DC
  Administered 2019-10-18: 1 via TRANSDERMAL
  Filled 2019-10-18: qty 1

## 2019-10-18 NOTE — Progress Notes (Signed)
[facility] Progress Note  Patient: William Ali    HFW:[id number]  DOA: 10/12/2019     Date of Service: the patient was seen and examined on 10/18/2019  Brief hospital course: Past medical history of DVT on Xarelto, metastatic adenocarcinoma of the lung with mets to brain and bone as well as liver and chronic pain secondary to metastasis.  Presents with complaints of worsening pain and diarrhea.  Found to have transverse colitis causing severe sepsis as well as worsening of his chronic cancer related pain. Currently plan is continue pain control and monitor for improvement in platelets.  Assessment and Plan: 1.  Severe sepsis, present on admission secondary to acute transverse colitis. With tachycardia tachypnea and lactic acidosis meeting sepsis criteria. Treated with IV fluids. Also treated with IV broad-spectrum antibiotics but currently antibiotics are discontinued. Patient does not have any further diarrhea. C. difficile antigen positive, antibody negative GI pathogen panel negative patient was started on oral vancomycin given his immunosuppressed status.  2.  History of DVT and PE. On Xarelto at home. Currently on hold given his drop in hemoglobin and platelets. Per Dr. Lorenso Courier hematology, okay to resume Xarelto as long as platelets are more than 50.  3.  Acute on chronic cancer-related back pain. Continues to have intractable back pain. Palliative care consulted. Appreciate assistance. Currently on fentanyl patch as well as IV fentanyl. Discontinue Toradol as needed given his worsening platelets. Patient was on steroids currently stopped. Continue baclofen. Monitor for pain control.  4.  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 Follows up with Dr. Lorenso Courier. Patient will have outpatient follow-up with them.  5.  Chemotherapy-induced pancytopenia. Platelets significantly lower along with rest of the blood count. Monitor for bleeding. Holding  anticoagulation.  6.  Hypomagnesemia Replaced.  7.  Obesity Body mass index is 33.05 kg/m.  In the setting of cancer patient actually is at risk for malnutrition. Monitor.  Diet: Regular diet DVT Prophylaxis: SCD     Advance goals of care discussion: Full code  Family Communication: family was present at bedside, at the time of interview.  The pt provided permission to discuss medical plan with the family. Opportunity was given to ask question and all questions were answered satisfactorily.   Disposition:  Status is: Inpatient  Remains inpatient appropriate because:Ongoing diagnostic testing needed not appropriate for outpatient work up   Dispo:  Patient From: Home  Planned Disposition: Home with Health Care Svc  Expected discharge date: 10/18/19  Medically stable for discharge: No  Subjective:  Continues to have pain.  No nausea no vomiting.  No fever.  No chills.  Continues to have diarrhea.  No blood in the stool.  Physical Exam:  General: Appear in moderate distress, no Rash; Oral Mucosa Clear, moist. no Abnormal Neck Mass Or lumps, Conjunctiva normal  Cardiovascular: S1 and S2 Present, no Murmur, Respiratory: good respiratory effort, Bilateral Air entry present and bilateral  Crackles, no wheezes Abdomen: Bowel Sound present, Soft and mild diffuse tenderness Extremities: no Pedal edema Neurology: alert and oriented to time, place, and person affect appropriate. no new focal deficit Gait not checked due to patient safety concerns  Vitals:   10/18/19 1046 10/18/19 1242 10/18/19 1424 10/18/19 1804  BP: (!) 175/87 (!) 162/84 (!) 155/86 (!) 182/92  Pulse: 79 88 91 89  Resp: (!) 30 (!) 24 (!) 26 (!) 32  Temp: 98.5 F (36.9 C) 98.4 F (36.9 C) 98.3 F (36.8 C) 98 F (36.7 C)  TempSrc: Oral Oral Oral Oral  SpO2: 100% 94% 96% 98%  Weight:      Height:        Intake/Output Summary (Last 24 hours) at 10/18/2019 1918 Last data filed at 10/18/2019 1800 Gross per  24 hour  Intake 800 ml  Output 500 ml  Net 300 ml   Filed Weights   10/12/19 1541 10/13/19 1611  Weight: 95.3 kg 95.7 kg    Data Reviewed: I have personally reviewed and interpreted daily labs, tele strips, imagings as discussed above. I reviewed all nursing notes, pharmacy notes, vitals, pertinent old records I have discussed plan of care as described above with RN and patient/family.  CBC: Recent Labs  Lab 10/14/19 0838 10/14/19 0838 10/15/19 0419 10/16/19 0353 10/16/19 2000 10/17/19 0836 10/18/19 0426  WBC 2.4*  --  2.6* 3.0*  --  4.1 3.6*  NEUTROABS 2.0  --  2.2 2.4  --  2.8 2.2  HGB 7.4*   < > 7.7* 6.4* 9.2* 8.9* 7.9*  HCT 25.4*   < > 26.0* 20.9* 28.6* 27.9* 25.7*  MCV 99.2  --  99.2 97.2  --  90.9 92.1  PLT 103*  --  88* 57*  --  61* 49*   < > = values in this interval not displayed.   Basic Metabolic Panel: Recent Labs  Lab 10/13/19 1115 10/14/19 0838 10/15/19 0419 10/16/19 0353 10/18/19 0426  NA 141 140 140 140 143  K 3.5 3.8 4.1 4.1 3.2*  CL 103 104 104 106 111  CO2 24 25 24 24 24   GLUCOSE 147* 106* 133* 119* 95  BUN 8 8 12 16 11   CREATININE 1.13 0.95 1.00 0.99 0.98  CALCIUM 7.5* 7.9* 8.8* 8.3* 7.7*  MG 1.2*  --  2.2  --  1.7  PHOS 2.8  --   --   --  3.3    Studies: No results found.  Scheduled Meds: . sodium chloride   Intravenous Once  . Chlorhexidine Gluconate Cloth  6 each Topical Daily  . DULoxetine  60 mg Oral Daily  . feeding supplement (ENSURE ENLIVE)  237 mL Oral BID BM  . fentaNYL  1 patch Transdermal Q72H  . folic acid  1 mg Oral Daily  . lidocaine  1 patch Transdermal Q24H  . metoprolol succinate  50 mg Oral Daily  . senna  1 tablet Oral QHS  . sodium chloride flush  10-40 mL Intracatheter Q12H  . sodium chloride flush  3 mL Intravenous Q12H  . vancomycin  125 mg Oral QID   Continuous Infusions: PRN Meds: acetaminophen **OR** acetaminophen, fentaNYL (SUBLIMAZE) injection, labetalol, ondansetron, oxyCODONE, pantoprazole,  polyethylene glycol, polyvinyl alcohol, sodium chloride flush  Time spent: 35 minutes  Author: Berle Mull, MD [facility] 10/18/2019 7:18 PM  To reach On-call, see care teams to locate the attending and reach out via [email or web address]. Between 7PM-7AM, please contact night-coverage If you still have difficulty reaching the attending provider, please page the [facility] (Director on Call) for [facility] on amion for assistance.

## 2019-10-18 NOTE — Progress Notes (Signed)
Daily Progress Note   Patient Name: William Ali       Date: 10/18/2019 DOB: Apr 27, 1985  Age: 34 y.o. MRN#: [id number] Attending Physician: Lavina Hamman, MD Primary Care Physician: Elsie Stain, MD Admit Date: 10/12/2019  Reason for Consultation/Follow-up: Pain control  Subjective: I saw and examined Mr. William Ali this evening.    Wife at bedside.  He reports that overall pain is improving.  Discussed prior symptoms, including headaches and myoclonic jerks.  Both of these have improved following transition off of dilaudid.  Discussed plan for transition from IV rescue medication (currently fentanyl) back to oral medication.  Discussed options for oral rescue meds and plan to discuss further with rest of our team tomorrow.   Length of Stay: 6  Current Medications: Scheduled Meds:  . sodium chloride   Intravenous Once  . Chlorhexidine Gluconate Cloth  6 each Topical Daily  . DULoxetine  60 mg Oral Daily  . fentaNYL  1 patch Transdermal Q72H  . folic acid  1 mg Oral Daily  . lidocaine  1 patch Transdermal Q24H  . metoprolol succinate  50 mg Oral Daily  . senna  1 tablet Oral QHS  . sodium chloride flush  10-40 mL Intracatheter Q12H  . sodium chloride flush  3 mL Intravenous Q12H  . vancomycin  125 mg Oral QID    Continuous Infusions: . sodium chloride 50 mL/hr at 10/17/19 2313  . lactated ringers Stopped (10/13/19 1105)    PRN Meds: acetaminophen **OR** acetaminophen, fentaNYL (SUBLIMAZE) injection, ketorolac, labetalol, ondansetron, pantoprazole, polyethylene glycol, polyvinyl alcohol, sodium chloride flush  Physical Exam         Resting comfortably Not in distress this morning.  Regular work of breathing S 1 S 2  Not noted to have myoclonic jerks LE at the time of  my visit.  No edema  Vital Signs: BP (!) 142/89 (BP Location: Right Arm)   Pulse 92   Temp (!) 97.5 F (36.4 C) (Oral)   Resp (!) 22   Ht 5\' 7"  (1.702 m)   Wt 95.7 kg   SpO2 96%   BMI 33.05 kg/m  SpO2: SpO2: 96 % O2 Device: O2 Device: Room Air O2 Flow Rate:    Intake/output summary:   Intake/Output Summary (Last 24 hours) at 10/18/2019 0756 Last data  filed at 10/18/2019 0600 Gross per 24 hour  Intake 1200 ml  Output 200 ml  Net 1000 ml   LBM: Last BM Date: 10/17/19 Baseline Weight: Weight: 95.3 kg Most recent weight: Weight: 95.7 kg       Palliative Assessment/Data:      Patient Active Problem List   Diagnosis Date Noted  . Chest pain   . Generalized abdominal pain   . Prolonged Q-T interval on ECG 10/14/2019  . Hypomagnesemia 10/14/2019  . Cancer related pain 10/14/2019  . SIRS (systemic inflammatory response syndrome) ([place]) 10/12/2019  . Sepsis ([place]) 10/12/2019  . Tachycardia 10/12/2019  . Colitis 10/12/2019  . Hypokalemia 10/12/2019  . Palliative care encounter   . Metastatic primary lung cancer ([place]) 06/17/2019  . 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 Pulmonary embolism ([place]) 06/13/2019  . DVT (deep venous thrombosis) ([place]) 06/13/2019  . Goals of care, counseling/discussion 05/21/2019  . Metastatic adenocarcinoma to brain ([place]) 05/21/2019  . Adenocarcinoma of lung ([place]) 05/21/2019  . Essential hypertension 08/17/2018  . 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 CT scan with transverse colitis, MRI spine with increased tumor burden, stable metastatic  fracture of the spine.   Assessment: Severe pain, uncontrolled at times.  Myoclonic jerks, tremor, hallucinations worse at night.  Back pain at site of T12 to S1 Electrolyte abnormalities.  PPS 40%  Recommendations/Plan: Continue trans dermal fentanyl as well as Fentanyl IV PRN.  Will discuss options for oral rescue medications with in team rounds tomorrow.  Will likely recommend oxycodone on d/c. Patient also on adjuvants such as Cymbalta, decadron, baclofen and Toradol.  Continue IVF, encourage PO. Patient completing treatment for possible C diff.   Goals of Care and Additional Recommendations: Limitations on Scope of Treatment: Full Scope Treatment  Code Status:    Code Status Orders  (From admission, onward)         Start     Ordered   10/12/19 2242  Full code  Continuous        10/12/19 2241        Code Status History    Date Active Date Inactive Code Status Order ID Comments User Context   06/17/2019 1740 06/28/2019 2149 Full Code [id number]  Shelly Coss, MD ED   06/13/2019 1525 06/16/2019 2244 Full Code [id number]  Georgette Shell, MD ED   Advance Care Planning Activity      Prognosis:  Unable to determine  Discharge Planning: To Be Determined  Care plan was discussed with  Patient, wife and bedside RN.   Thank you for allowing the Palliative Medicine Team to assist in the care of this patient.   Time In: 1800 Time Out: 1830 Total Time 30 Prolonged Time Billed No   Greater than 50%  of this time was spent counseling and coordinating care related to the above assessment and plan.  Micheline Rough, MD  Please contact Palliative Medicine Team phone at [phone] for questions and concerns.

## 2019-10-18 NOTE — Plan of Care (Signed)
  Problem: Nutrition: Goal: Adequate nutrition will be maintained Outcome: Progressing   Problem: Coping: Goal: Level of anxiety will decrease Outcome: Progressing   Problem: Elimination: Goal: Will not experience complications related to bowel motility Outcome: Progressing Goal: Will not experience complications related to urinary retention Outcome: Progressing   

## 2019-10-18 NOTE — Progress Notes (Signed)
Daily Progress Note   Patient Name: William Ali       Date: 10/18/2019 DOB: [birth year]  Age: 34 y.o. MRN#: [id number] Attending Physician: Lavina Hamman, MD Primary Care Physician: Elsie Stain, MD Admit Date: 10/12/2019  Reason for Consultation/Follow-up: Pain control  Subjective: I saw and examined William Ali this morning.    He reports having worsening pain overnight.  He received 2 doses of IV fentanyl which seems to help control his pain but does not last very long per his report.  I looked at his fentanyl patch that was placed on 7/20 and is largely unadhered at this point.  Discussed plan to change patch today as a believe this may be be the cause of increase in pain.  Discussed plan for transition from IV rescue medication (currently fentanyl) back to oral medication.  I reviewed options with Dr. Hilma Favors and based upon concern for Dilaudid toxicity prior, will plan for trial of oxycodone for rescue medication for breakthrough pain.  Length of Stay: 6  Current Medications: Scheduled Meds:  . sodium chloride   Intravenous Once  . Chlorhexidine Gluconate Cloth  6 each Topical Daily  . DULoxetine  60 mg Oral Daily  . feeding supplement (ENSURE ENLIVE)  237 mL Oral BID BM  . fentaNYL  1 patch Transdermal Q72H  . folic acid  1 mg Oral Daily  . lidocaine  1 patch Transdermal Q24H  . metoprolol succinate  50 mg Oral Daily  . potassium chloride  40 mEq Oral Q2H  . senna  1 tablet Oral QHS  . sodium chloride flush  10-40 mL Intracatheter Q12H  . sodium chloride flush  3 mL Intravenous Q12H  . vancomycin  125 mg Oral QID    Continuous Infusions:   PRN Meds: acetaminophen **OR** acetaminophen, fentaNYL (SUBLIMAZE) injection, labetalol, ondansetron, oxyCODONE, pantoprazole,  polyethylene glycol, polyvinyl alcohol, sodium chloride flush  Physical Exam         Resting comfortably Not in distress this morning.  Regular work of breathing S 1 S 2  Not noted to have myoclonic jerks LE at the time of my visit.  No edema  Vital Signs: BP (!) 175/87 (BP Location: Right Wrist)   Pulse 79   Temp 98.5 F (36.9 C) (Oral)   Resp (!) 30  Ht 5\' 7"  (1.702 m)   Wt 95.7 kg   SpO2 100%   BMI 33.05 kg/m  SpO2: SpO2: 100 % O2 Device: O2 Device: Room Air O2 Flow Rate:    Intake/output summary:   Intake/Output Summary (Last 24 hours) at 10/18/2019 1240 Last data filed at 10/18/2019 0848 Gross per 24 hour  Intake 1000 ml  Output 450 ml  Net 550 ml   LBM: Last BM Date: 10/17/19 Baseline Weight: Weight: 95.3 kg Most recent weight: Weight: 95.7 kg       Palliative Assessment/Data:      Patient Active Problem List   Diagnosis Date Noted  . Chest pain   . Generalized abdominal pain   . Prolonged Q-T interval on ECG 10/14/2019  . Hypomagnesemia 10/14/2019  . Cancer related pain 10/14/2019  . SIRS (systemic inflammatory response syndrome) ([place]) 10/12/2019  . Sepsis ([place]) 10/12/2019  . Tachycardia 10/12/2019  . Colitis 10/12/2019  . Hypokalemia 10/12/2019  . Palliative care encounter   . Metastatic primary lung cancer ([place]) 06/17/2019  . 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 Pulmonary embolism ([place]) 06/13/2019  . DVT (deep venous thrombosis) ([place]) 06/13/2019  . Goals of care, counseling/discussion 05/21/2019  . Metastatic adenocarcinoma to brain ([place]) 05/21/2019  . Adenocarcinoma of lung ([place]) 05/21/2019  . Essential hypertension 08/17/2018  . 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 CT scan with transverse colitis, MRI spine with increased tumor burden, stable metastatic fracture of the spine.   Assessment: Severe pain, uncontrolled at times.  Myoclonic jerks, tremor, hallucinations worse at night.  Back pain at site of T12 to S1 Electrolyte abnormalities.  PPS 40%  Recommendations/Plan: Pain, Cancer related:  Continue trans dermal fentanyl for long-acting agent.  Discussed options for oral rescue medication on discharge with Dr. Hilma Favors this morning.  Plan for trial of oxycodone 10 mg every 6 hours as needed for breakthrough pain.  I believe the signs he was having on admission were related to toxicity from increasing frequency of as needed Dilaudid.  Will need to monitor closely to ensure no further side effect/opioid toxicity arises with transition to oxycodone.  Additionally, I left backup dose of fentanyl 50 mcg every 2 hours as needed for breakthrough pain if oral medication is ineffective.  I talked with his nurse at the bedside this morning and discussed recommendation for dose of fentanyl to be given 40 minutes after oral medication if oral medication is ineffective to relieve his pain.  This backup dose of fentanyl can then be repeated up to every 2 hours until he is due for his next dose of oral medication. - He also reports having increase in pain overnight.  I looked at his fentanyl patch which pulled up, no longer sticky, and is largely unadhered at this point.  I called and discussed with pharmacy we will plan to check patch changed today as I believe this may be the cause of his increase in pain. - Patient also on adjuvants such as Cymbalta, decadron, and baclofen.  Agree with stopping Toradol as he has low platelets and now with decrease in hemoglobin..   Goals of  Care and Additional Recommendations: Limitations on Scope of  Treatment: Full Scope Treatment  Code Status:    Code Status Orders  (From admission, onward)         Start     Ordered   10/12/19 2242  Full code  Continuous        10/12/19 2241        Code Status History    Date Active Date Inactive Code Status Order ID Comments User Context   06/17/2019 1740 06/28/2019 2149 Full Code [id number]  Shelly Coss, MD ED   06/13/2019 1525 06/16/2019 2244 Full Code [id number]  Georgette Shell, MD ED   Advance Care Planning Activity      Prognosis:  Unable to determine  Discharge Planning: To Be Determined  Care plan was discussed with  Patient, wife, Dr. Hilma Favors and bedside RN.   Thank you for allowing the Palliative Medicine Team to assist in the care of this patient.   Time In: 1030 Time Out: 1115 Total Time 45 Prolonged Time Billed No   Greater than 50%  of this time was spent counseling and coordinating care related to the above assessment and plan.  Micheline Rough, MD  Please contact Palliative Medicine Team phone at [phone] for questions and concerns.

## 2019-10-19 ENCOUNTER — Inpatient Hospital Stay (HOSPITAL_COMMUNITY): Payer: PRIVATE HEALTH INSURANCE

## 2019-10-19 LAB — CBC WITH DIFFERENTIAL/PLATELET
Abs Immature Granulocytes: 0.25 10*3/uL — ABNORMAL HIGH (ref 0.00–0.07)
Basophils Absolute: 0 10*3/uL (ref 0.0–0.1)
Basophils Relative: 1 %
Eosinophils Absolute: 0 10*3/uL (ref 0.0–0.5)
Eosinophils Relative: 1 %
HCT: 29.6 % — ABNORMAL LOW (ref 39.0–52.0)
Hemoglobin: 9 g/dL — ABNORMAL LOW (ref 13.0–17.0)
Immature Granulocytes: 6 %
Lymphocytes Relative: 4 %
Lymphs Abs: 0.2 10*3/uL — ABNORMAL LOW (ref 0.7–4.0)
MCH: 28.3 pg (ref 26.0–34.0)
MCHC: 30.4 g/dL (ref 30.0–36.0)
MCV: 93.1 fL (ref 80.0–100.0)
Monocytes Absolute: 1.3 10*3/uL — ABNORMAL HIGH (ref 0.1–1.0)
Monocytes Relative: 30 %
Neutro Abs: 2.5 10*3/uL (ref 1.7–7.7)
Neutrophils Relative %: 58 %
Platelets: 45 10*3/uL — ABNORMAL LOW (ref 150–400)
RBC: 3.18 MIL/uL — ABNORMAL LOW (ref 4.22–5.81)
RDW: 20 % — ABNORMAL HIGH (ref 11.5–15.5)
WBC: 4.2 10*3/uL (ref 4.0–10.5)
nRBC: 2.1 % — ABNORMAL HIGH (ref 0.0–0.2)

## 2019-10-19 LAB — BASIC METABOLIC PANEL
Anion gap: 9 (ref 5–15)
BUN: 8 mg/dL (ref 6–20)
CO2: 23 mmol/L (ref 22–32)
Calcium: 8.2 mg/dL — ABNORMAL LOW (ref 8.9–10.3)
Chloride: 110 mmol/L (ref 98–111)
Creatinine, Ser: 0.81 mg/dL (ref 0.61–1.24)
GFR calc Af Amer: >60 mL/min (ref >60)
GFR calc non Af Amer: >60 mL/min (ref >60)
Glucose, Bld: 92 mg/dL (ref 70–99)
Potassium: 3.7 mmol/L (ref 3.5–5.1)
Sodium: 142 mmol/L (ref 135–145)

## 2019-10-19 IMAGING — DX DG ABD PORTABLE 1V
1 series · 1 of 1 positions shown · non-contrast
Comparison: [DATE].

CLINICAL DATA: Constipation.

EXAM:
PORTABLE ABDOMEN - 1 VIEW

[abdomen kub]
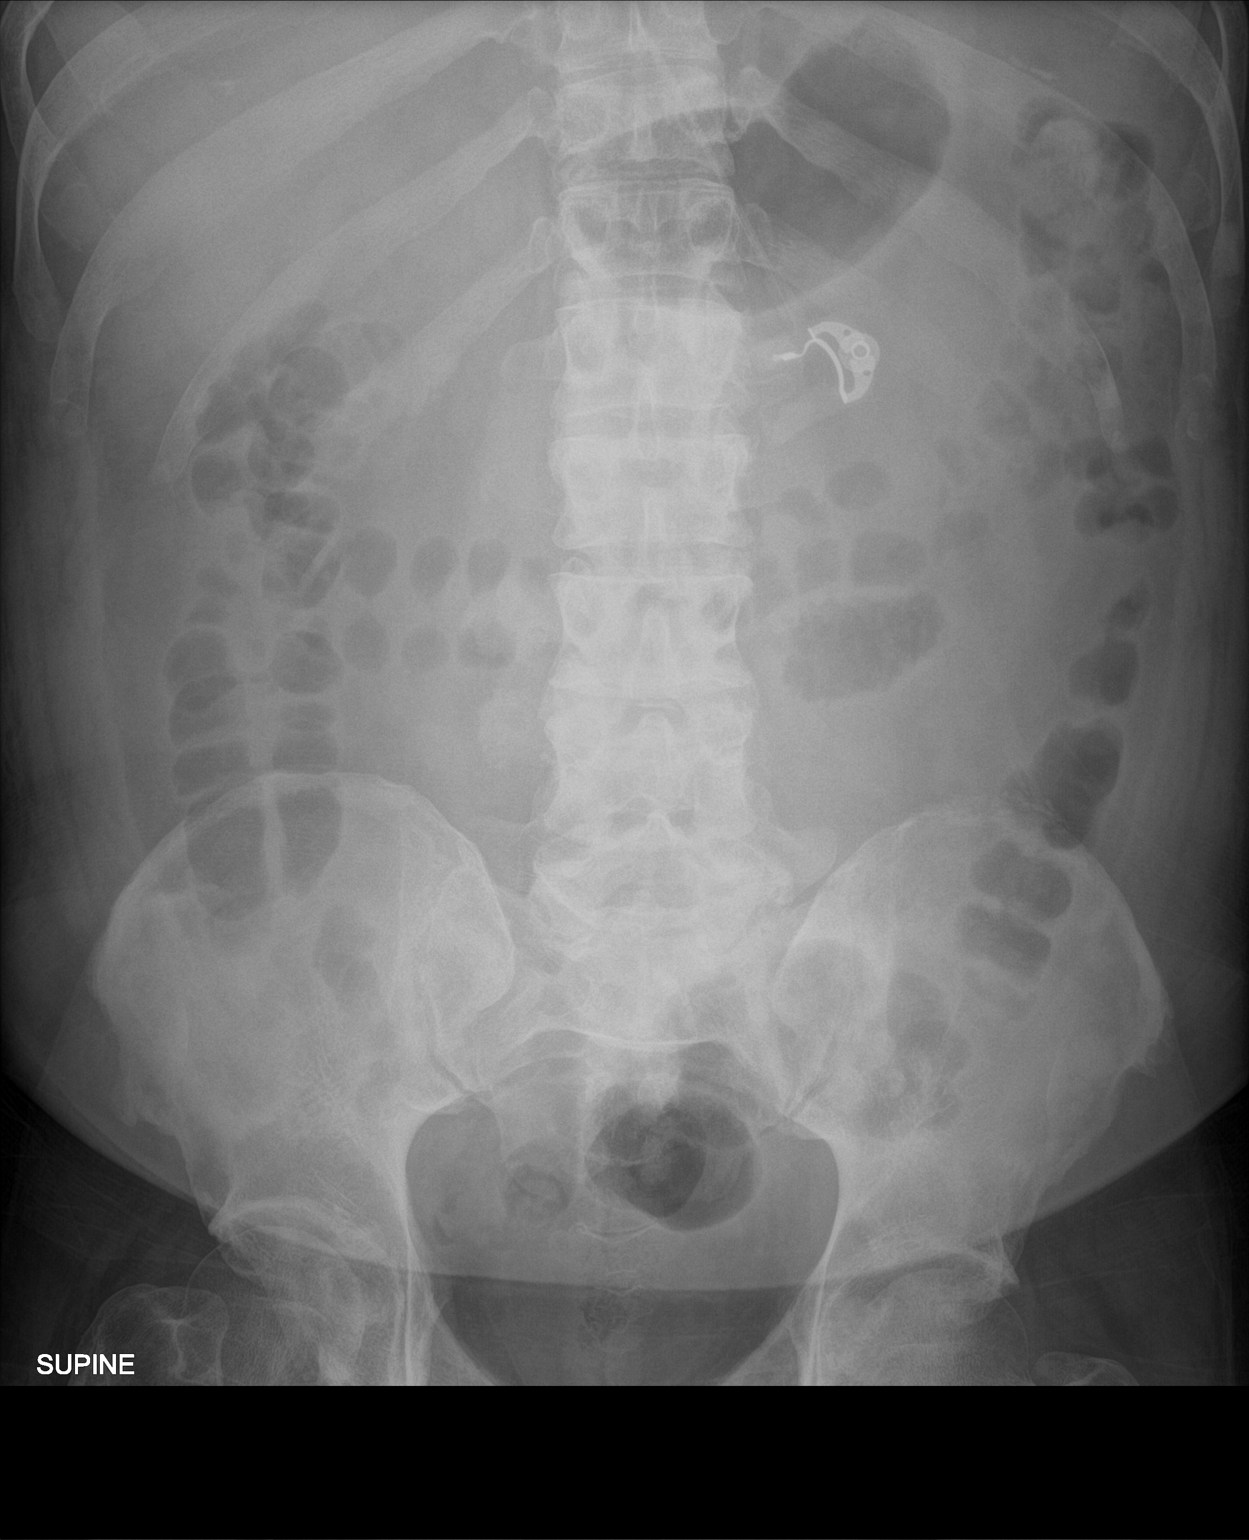

[1 of 1 positions shown; findings below may reference images not displayed]

FINDINGS: The bowel gas pattern is normal. No significant stool burden is
noted. No radio-opaque calculi or other significant radiographic
abnormality are seen.
IMPRESSION: Negative.

## 2019-10-19 IMAGING — CT CT ABD-PELV W/ CM
2 of 4 series · 16 of 46 positions shown, 18 images · IV contrast (omnipaque)
Comparison: CT from [DATE], plain film from earlier in the same
day.

CLINICAL DATA: Lower abdominal pain

EXAM:
CT ABDOMEN AND PELVIS WITH CONTRAST
TECHNIQUE: Multidetector CT imaging of the abdomen and pelvis was performed
using the standard protocol following bolus administration of
intravenous contrast.
CONTRAST:  100mL OMNIPAQUE IOHEXOL 300 MG/ML  SOLN

[Series 2: axial st · axial · 0.83mm/px · z∈[+1057,+1482]mm · 13 of 97 slices shown, 15 images]
[im 6/97  soft-tissue]
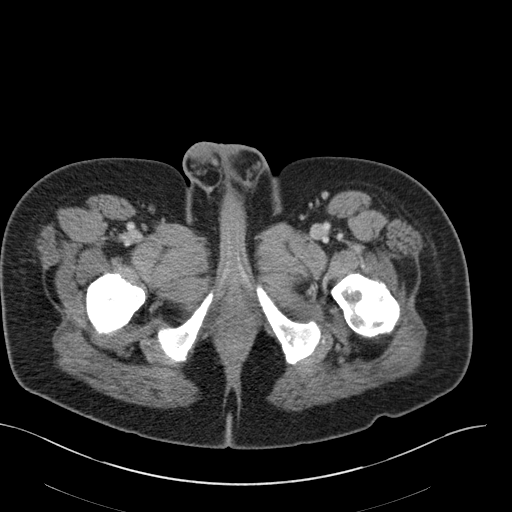
[im 6/97  bone]
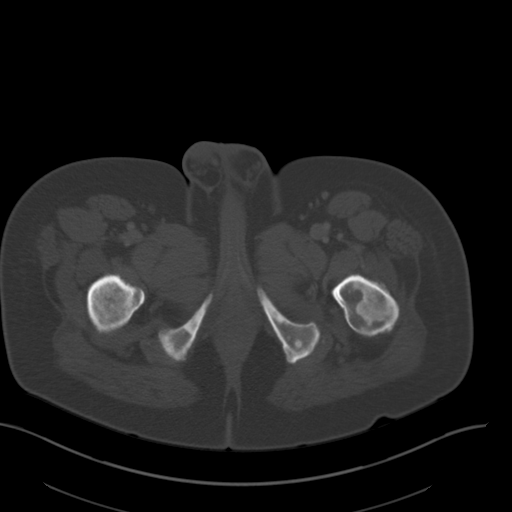
[im 11/97  soft-tissue]
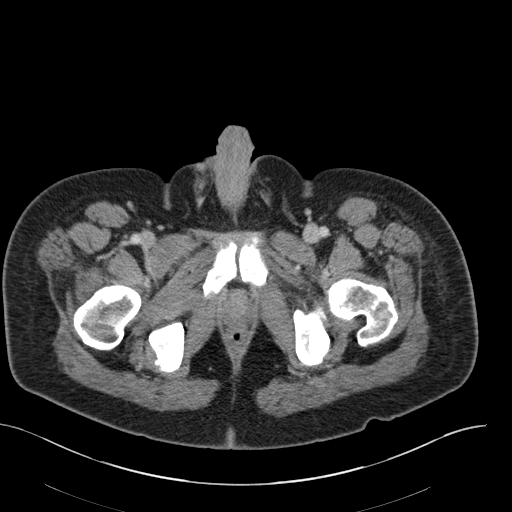
[im 22/97  soft-tissue]
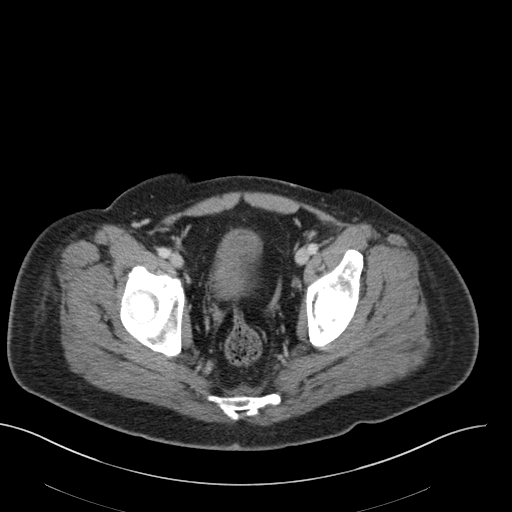
[im 27/97  soft-tissue]
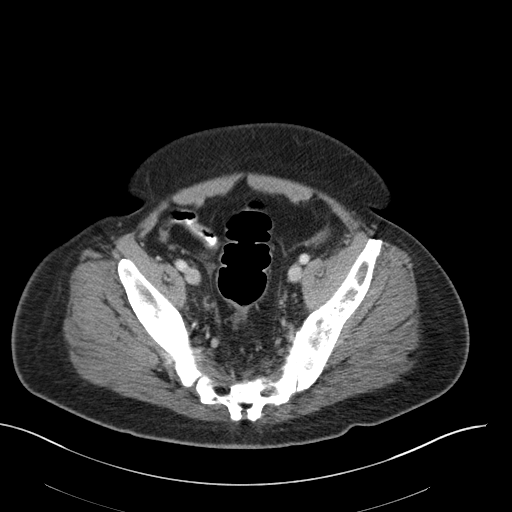
[im 33/97  soft-tissue]
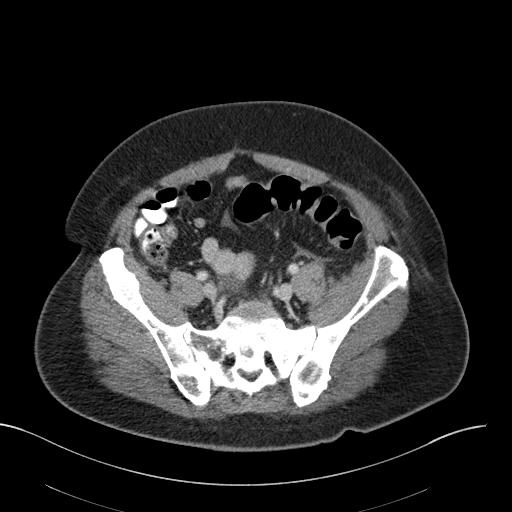
[im 43/97  soft-tissue]
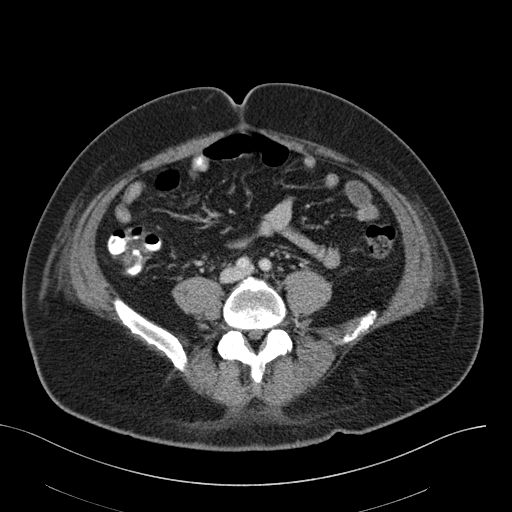
[im 49/97  soft-tissue]
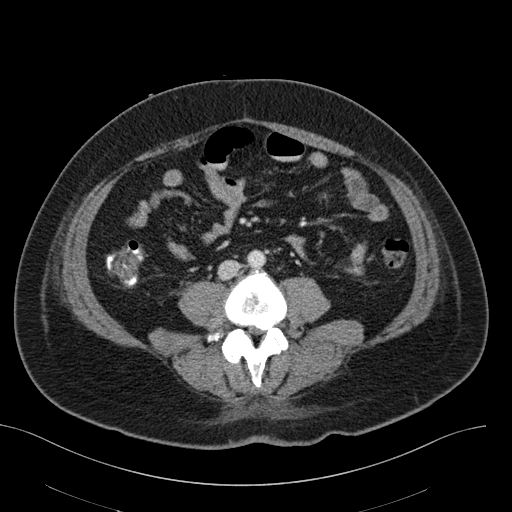
[im 54/97  soft-tissue]
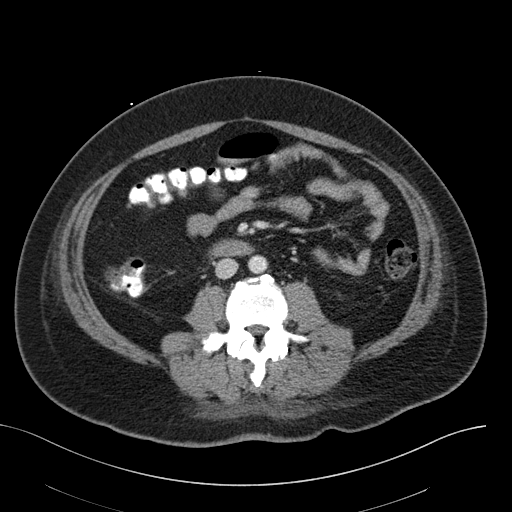
[im 65/97  soft-tissue]
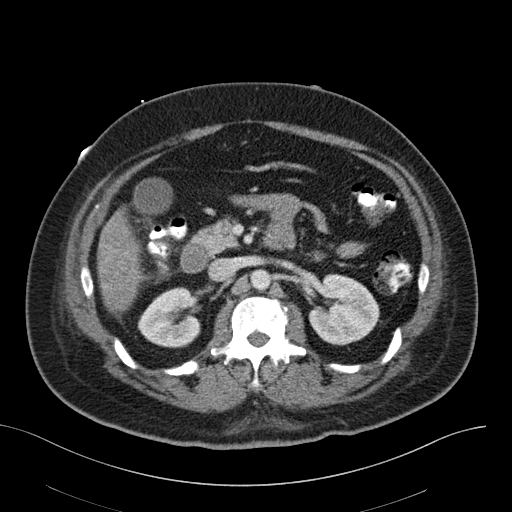
[im 65/97  bone]
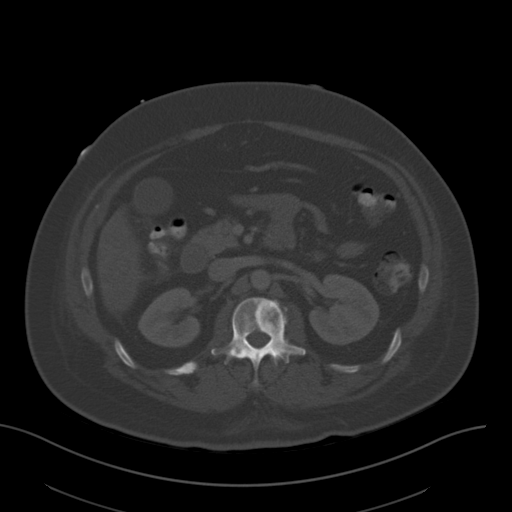
[im 70/97  soft-tissue]
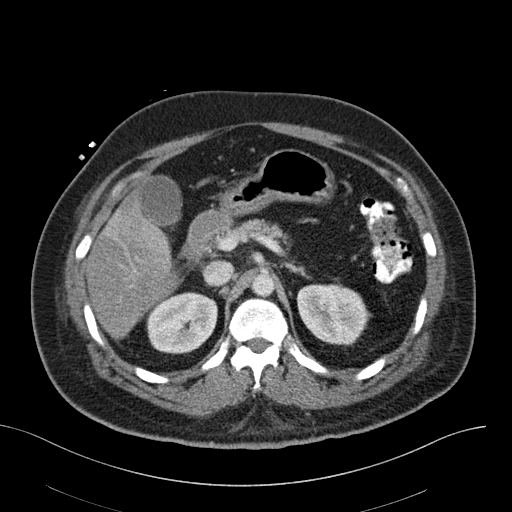
[im 75/97  soft-tissue]
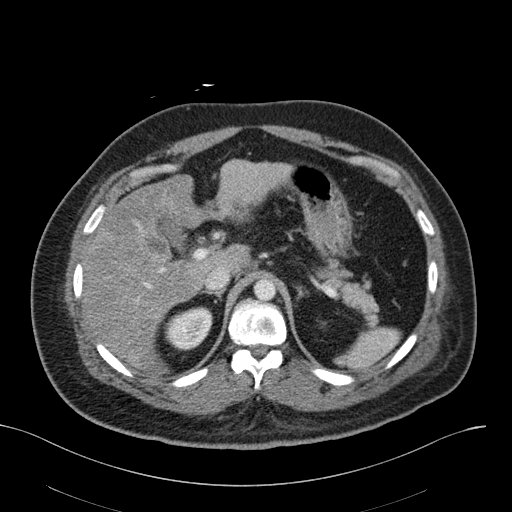
[im 86/97  soft-tissue]
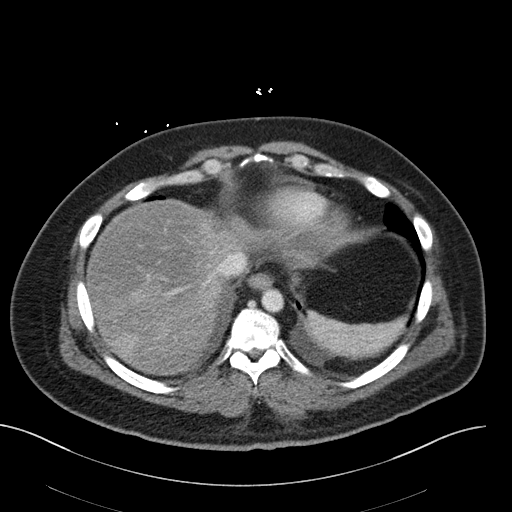
[im 91/97  soft-tissue]
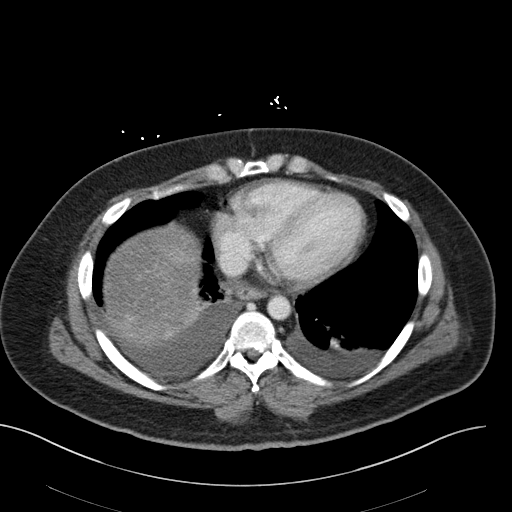

[Series 4: coronal st · coronal · 0.78mm/px · 3 of 157 slices shown]
[im 53/157  soft-tissue]
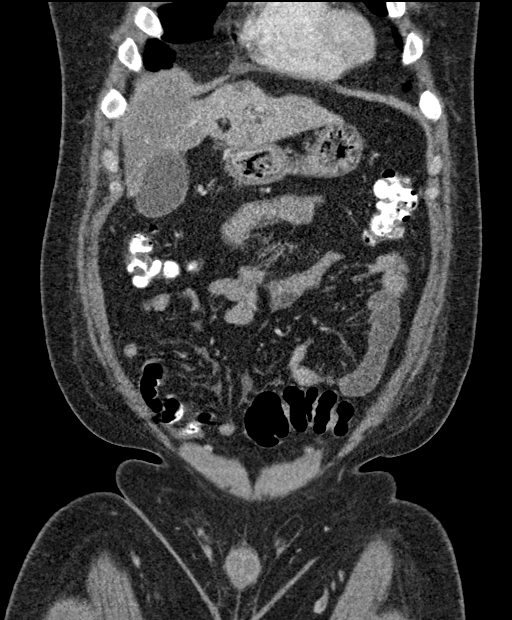
[im 70/157  soft-tissue]
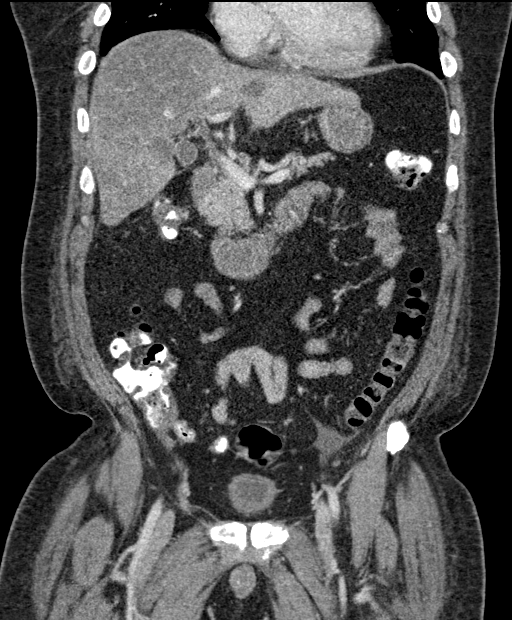
[im 87/157  soft-tissue]
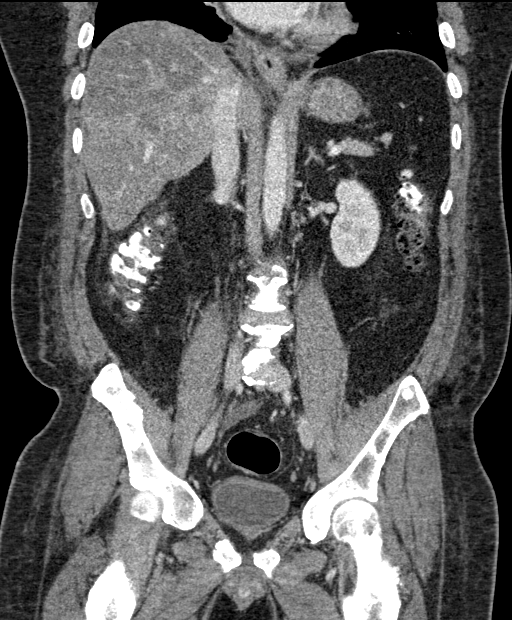

[16 of 46 positions shown; findings below may reference images not displayed]

FINDINGS: Lower chest: Bilateral pleural effusions are noted right greater
than left. Associated basilar atelectatic changes are noted.

Hepatobiliary: Liver demonstrates multiple ring-enhancing lesions
consistent with metastatic disease similar to that noted on the
prior exam. Gallbladder is within normal limits. Fatty infiltration
of the liver is seen as well.

Pancreas: Unremarkable. No pancreatic ductal dilatation or
surrounding inflammatory changes.

Spleen: Normal in size without focal abnormality.

Adrenals/Urinary Tract: Adrenal glands are within normal limits.
Kidneys demonstrate a normal enhancement pattern bilaterally. No
renal calculi are noted. Small cyst is noted within the right
kidney. Bladder is within normal limits.

Stomach/Bowel: The appendix is within normal limits. No obstructive
or inflammatory changes of the colon are noted. Stomach is
decompressed. Small bowel is within normal limits.

Vascular/Lymphatic: No significant vascular findings are present. No
enlarged abdominal or pelvic lymph nodes.

Reproductive: Prostate is unremarkable.

Other: No abdominal wall hernia or abnormality. No abdominopelvic
ascites.

Musculoskeletal: Sclerotic foci are noted throughout the visualized
bony structures consistent with metastatic disease. This is stable
from the prior exam.
IMPRESSION: Stable hepatic and bony metastatic disease.

Fatty liver.

No other focal abnormality is noted.

## 2019-10-19 IMAGING — DX DG CHEST 1V PORT
1 series · 1 of 1 positions shown · non-contrast
Comparison: [DATE].

CLINICAL DATA: Pleuritic chest pain. History of metastatic lung
cancer.

EXAM:
PORTABLE CHEST 1 VIEW

[chest ap]
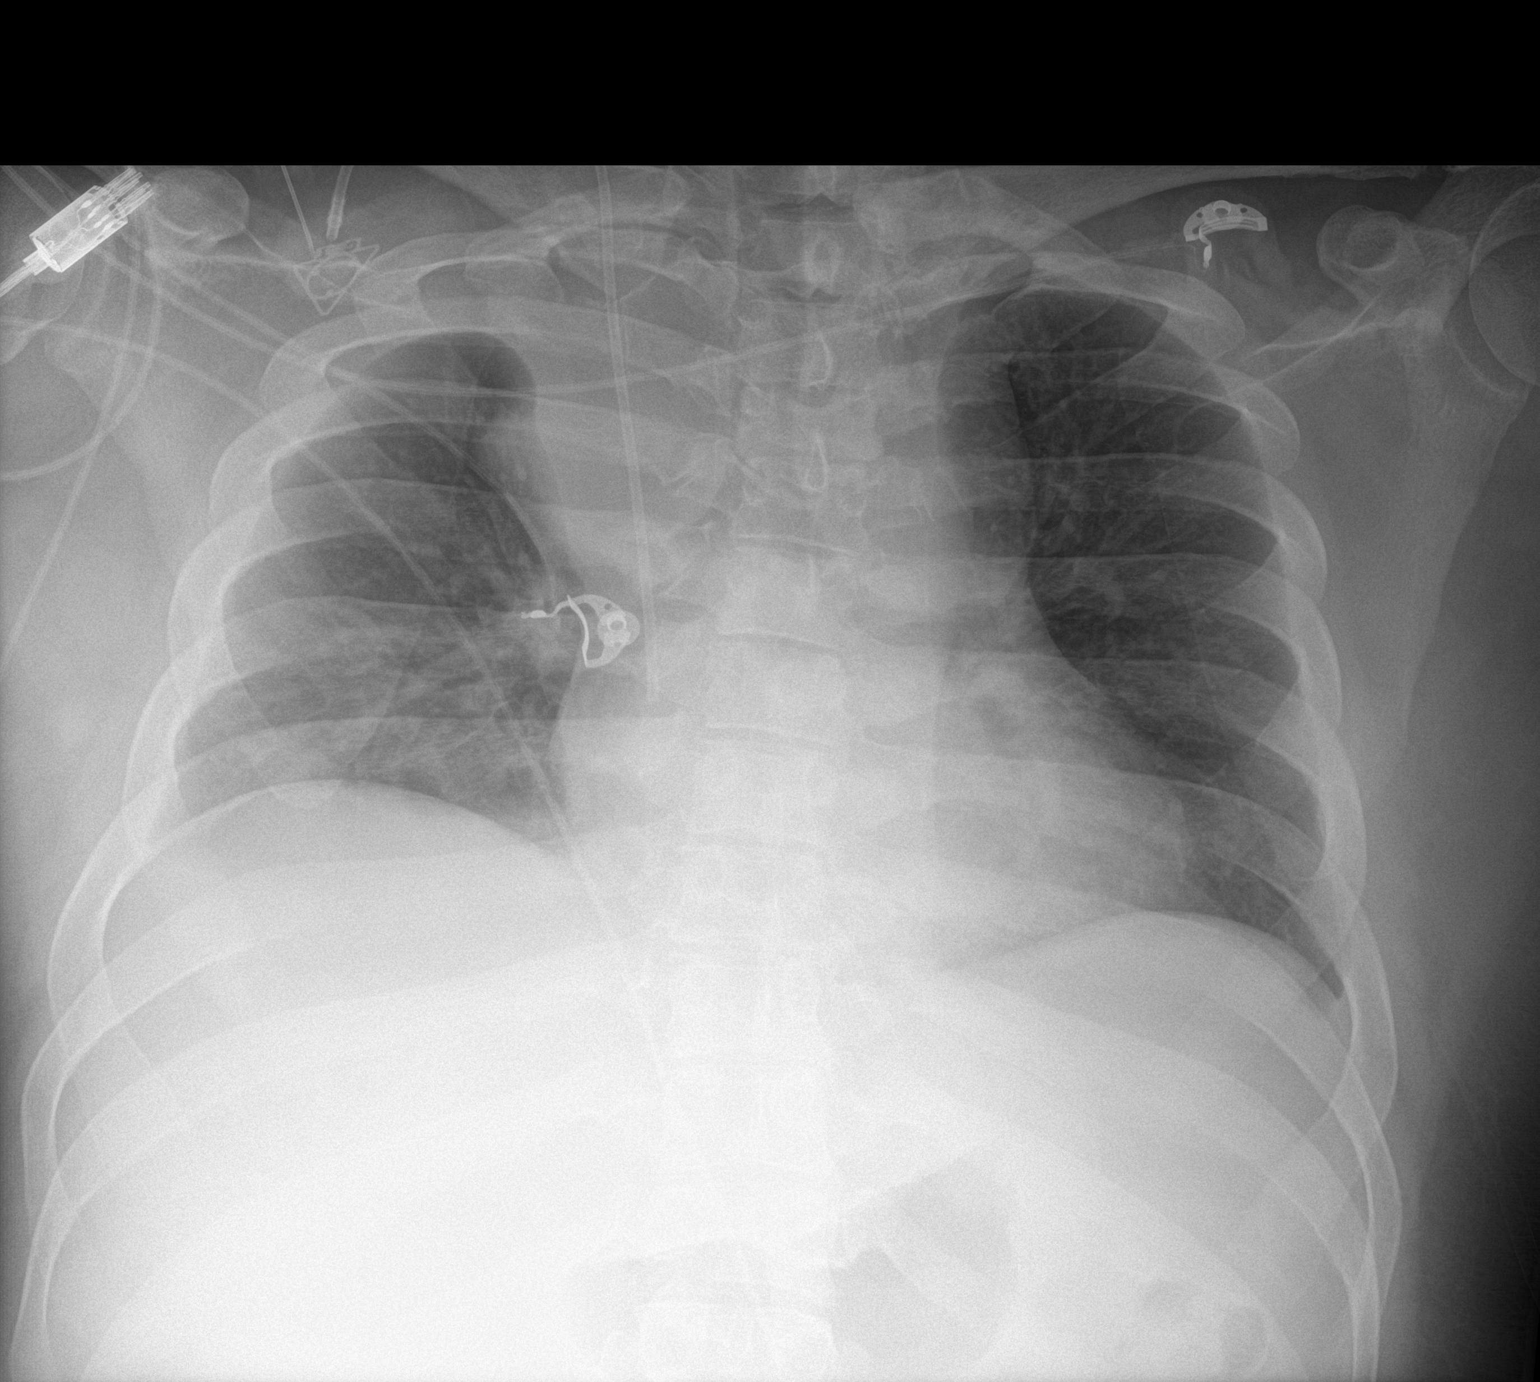

[1 of 1 positions shown; findings below may reference images not displayed]

FINDINGS: Stable position of right internal jugular Port-A-Cath. Increased
right paratracheal density is noted concerning for worsening
adenopathy or metastatic disease. Left lung is clear. No
pneumothorax or pleural effusion is noted. Bony thorax is
unremarkable.
IMPRESSION: Increased right paratracheal density is noted concerning for
worsening adenopathy or metastatic disease.

## 2019-10-19 MED ORDER — HYDRALAZINE HCL 25 MG PO TABS
25.0000 mg | ORAL_TABLET | Freq: Four times a day (QID) | ORAL | Status: DC | PRN
  Administered 2019-10-19 – 2019-10-27 (×3): 25 mg via ORAL
  Filled 2019-10-19 (×3): qty 1

## 2019-10-19 MED ORDER — IOHEXOL 300 MG/ML  SOLN
100.0000 mL | Freq: Once | INTRAMUSCULAR | Status: AC | PRN
  Administered 2019-10-19: 100 mL via INTRAVENOUS

## 2019-10-19 MED ORDER — FENTANYL 100 MCG/HR TD PT72
1.0000 | MEDICATED_PATCH | TRANSDERMAL | Status: DC
  Administered 2019-10-19: 1 via TRANSDERMAL
  Filled 2019-10-19: qty 1

## 2019-10-19 MED ORDER — FENTANYL CITRATE (PF) 100 MCG/2ML IJ SOLN
50.0000 ug | INTRAMUSCULAR | Status: DC | PRN
  Administered 2019-10-19 – 2019-10-20 (×10): 100 ug via INTRAVENOUS
  Filled 2019-10-19 (×11): qty 2

## 2019-10-19 MED ORDER — IOHEXOL 9 MG/ML PO SOLN
1000.0000 mL | ORAL | Status: AC
  Administered 2019-10-19: 1000 mL via ORAL

## 2019-10-19 MED ORDER — DICYCLOMINE HCL 10 MG PO CAPS
10.0000 mg | ORAL_CAPSULE | Freq: Three times a day (TID) | ORAL | Status: DC
  Administered 2019-10-19 – 2019-10-28 (×26): 10 mg via ORAL
  Filled 2019-10-19 (×26): qty 1

## 2019-10-19 NOTE — Progress Notes (Addendum)
Daily Progress Note   Patient Name: William Ali       Date: 10/19/2019 DOB: 10-29-1985  Age: 34 y.o. MRN#: [id number] Attending Physician: Lavina Hamman, MD Primary Care Physician: Elsie Stain, MD Admit Date: 10/12/2019  Reason for Consultation/Follow-up: Pain control  Subjective: I saw and examined Mr. Kope this morning.    Discussed continued pain and changes that he has been having to his pain.  He reports that today, his pain is more in his abdomen rather than being in his back.  He reports pain is in RUQ, sharp, does not radiate, worse with deep breath and better with pain meds and lying still.  Discussed options for pain management with patient, Dr. Posey Pronto, and Dr. Hilma Favors.  Length of Stay: 7  Current Medications: Scheduled Meds:  . sodium chloride   Intravenous Once  . Chlorhexidine Gluconate Cloth  6 each Topical Daily  . dicyclomine  10 mg Oral TID AC  . DULoxetine  60 mg Oral Daily  . feeding supplement (ENSURE ENLIVE)  237 mL Oral BID BM  . fentaNYL  1 patch Transdermal Q72H  . folic acid  1 mg Oral Daily  . lidocaine  1 patch Transdermal Q24H  . metoprolol succinate  50 mg Oral Daily  . senna  1 tablet Oral QHS  . sodium chloride flush  10-40 mL Intracatheter Q12H  . sodium chloride flush  3 mL Intravenous Q12H  . vancomycin  125 mg Oral QID    Continuous Infusions:   PRN Meds: acetaminophen **OR** acetaminophen, fentaNYL (SUBLIMAZE) injection, hydrALAZINE, labetalol, ondansetron, pantoprazole, polyethylene glycol, polyvinyl alcohol, sodium chloride flush  Physical Exam         Resting comfortably Not in distress this morning.  Regular work of breathing S 1 S 2  Not noted to have myoclonic jerks LE at the time of my visit.  No edema  Vital  Signs: BP (!) 154/96 (BP Location: Right Arm)   Pulse (!) 102   Temp 98.5 F (36.9 C) (Oral)   Resp 18   Ht 5\' 7"  (1.702 m)   Wt 95.7 kg   SpO2 97%   BMI 33.05 kg/m  SpO2: SpO2: 97 % O2 Device: O2 Device: Room Air O2 Flow Rate:    Intake/output summary:   Intake/Output Summary (Last 24  hours) at 10/19/2019 2307 Last data filed at 10/19/2019 2118 Gross per 24 hour  Intake --  Output 1425 ml  Net -1425 ml   LBM: Last BM Date: 10/18/19 Baseline Weight: Weight: 95.3 kg Most recent weight: Weight: 95.7 kg       Palliative Assessment/Data:    Flowsheet Rows     Most Recent Value  Intake Tab  Referral Department Hospitalist  Unit at Time of Referral ER  Palliative Care Primary Diagnosis Cancer  Date Notified 10/12/19  Palliative Care Type Return patient Palliative Care  Reason for referral Pain, Non-pain Symptom  Date of Admission 10/12/19  Date first seen by Palliative Care 10/14/19  # of days Palliative referral response time 2 Day(s)  # of days IP prior to Palliative referral 0  Clinical Assessment  Psychosocial & Spiritual Assessment  Palliative Care Outcomes      Patient Active Problem List   Diagnosis Date Noted  . Chest pain   . Generalized abdominal pain   . Prolonged Q-T interval on ECG 10/14/2019  . Hypomagnesemia 10/14/2019  . Cancer related pain 10/14/2019  . SIRS (systemic inflammatory response syndrome) ([place]) 10/12/2019  . Sepsis ([place]) 10/12/2019  . Tachycardia 10/12/2019  . Colitis 10/12/2019  . Hypokalemia 10/12/2019  . Palliative care encounter   . Metastatic primary lung cancer ([place]) 06/17/2019  . 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 Pulmonary embolism ([place]) 06/13/2019  . DVT (deep venous thrombosis) ([place]) 06/13/2019  . Goals of care, counseling/discussion 05/21/2019  . Metastatic adenocarcinoma to brain ([place]) 05/21/2019  . Adenocarcinoma of lung ([place]) 05/21/2019  . Essential hypertension 08/17/2018  .  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 CT scan with transverse colitis, MRI spine with increased tumor burden, stable metastatic fracture of the spine.   Assessment: Severe pain, uncontrolled at times.  Myoclonic jerks, tremor, hallucinations worse at night.  Back pain at site of T12 to S1 Electrolyte abnormalities.  PPS 40%  Recommendations/Plan: Pain, Cancer related:  Increase fentanyl back to 153mcg/hr.  Will reorder prn fentanyl 50-170mcg overnight until higher dose patch can take effect. - I held breakthrough oxycodone in favor of IV fentanyl overnight.  His case is difficult as he developed what appears to be opioid toxicity with increased doses of Dilaudid prior to admission.  Will need to continue to be judicious with our use of pain medications moving forward. - May need to consider methadone as next step if pain remains poorly controlled with increased fentanyl.  He did have prolonged QT on at least 1 prior EKG, but it appears this has improved since he has been in the hospital. - Patient also on adjuvants such as Cymbalta, decadron, and baclofen.  Agree with stopping Toradol as he has low platelets and now with decrease in hemoglobin. 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  Often, unfortunately, opioids and of themselves are not as effective for bony pain is other interventions.  -Recommend consideration for evaluation by radiation oncology.  He was treated in May,  but I think  it would be worthwhile to reach out to asked them to review his case for possible treatment of bony mets.  Goals of Care and Additional Recommendations: Limitations on Scope of Treatment: Full Scope Treatment  Code Status:    Code Status Orders  (From admission, onward)         Start     Ordered   10/12/19 2242  Full code  Continuous        10/12/19 2241        Code Status History    Date Active Date Inactive Code Status Order ID Comments User Context   06/17/2019 1740 06/28/2019 2149 Full Code [id number]  Shelly Coss, MD ED   06/13/2019 1525 06/16/2019 2244 Full Code [id number]  Georgette Shell, MD ED   Advance Care Planning Activity      Prognosis:  Unable to determine  Discharge Planning: To Be Determined  Care plan was discussed with  Patient, wife, Dr. Hilma Favors, Dr. Posey Pronto and bedside RN.   Thank you for allowing the Palliative Medicine Team to assist in the care of this patient.   Total Time 40 Prolonged Time Billed No   Greater than 50%  of this time was spent counseling and coordinating care related to the above assessment and plan.  Micheline Rough, MD  Please contact Palliative Medicine Team phone at [phone] for questions and concerns.

## 2019-10-19 NOTE — Progress Notes (Signed)
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  Presents with complaints of worsening pain and diarrhea.  Found to have transverse colitis causing severe sepsis as well as worsening of his chronic cancer related pain.  Currently plan is continue pain control, further work-up as well as monitor for improvement and stabilization of platelets.  Assessment and Plan: 1.  Severe sepsis, present on admission secondary to acute transverse colitis. With tachycardia tachypnea and lactic acidosis meeting sepsis criteria. Treated with IV fluids. Also treated with IV broad-spectrum antibiotics but currently antibiotics are discontinued. Patient does not have any further diarrhea. C. difficile antigen positive, antibody negative GI pathogen panel negative patient was started on oral vancomycin given his immunosuppressed status.  2.  History of DVT and PE. On Xarelto at home. Currently on hold given his drop in hemoglobin and platelets. Per Dr. Lorenso Courier hematology, okay to resume Xarelto as long as platelets are more than 50. Currently pregnant remains less than 50. Requested Dr. Lorenso Courier to evaluate the patient and provide suggestion.  3.  Acute on chronic cancer-related back pain. Continues to have intractable back pain. Palliative care consulted. Appreciate assistance. Currently on fentanyl patch as well as IV fentanyl. Discontinue Toradol as needed given his worsening platelets. Patient was on steroids currently stopped. Continue baclofen. Monitor for pain control. Pain is worsening. Appreciate palliative care assistance regarding up titration of the pain medications. We will perform CT abdomen  pelvis to ensure there is no other acute abnormality. X-ray abdomen negative. X-ray chest shows worsening lesions. Depending on the results of the CT abdomen we will also discuss with radiation oncology for further assistance.  4.  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 Follows up with Dr. Lorenso Courier. Patient will have outpatient follow-up with them.  5.  Chemotherapy-induced pancytopenia. Platelets significantly lower along with rest of the blood count. Monitor for bleeding. Holding anticoagulation.  6.  Hypomagnesemia Replaced.  7.  Obesity Body mass index is 33.05 kg/m.  In the setting of cancer patient actually is at risk for malnutrition. Monitor.  Diet: Regular diet DVT Prophylaxis: SCDs.     Advance goals of care discussion: Full code  Family Communication: family was present at bedside, at the time of interview.  The pt provided permission to discuss medical plan with the family. Opportunity was given to ask question and all questions were answered satisfactorily.   Disposition:  Status is: Inpatient  Remains inpatient appropriate because:Ongoing active pain requiring inpatient pain management   Dispo:  Patient From: Home  Planned Disposition: Home with [facility]  Expected discharge date: To be determined  Medically stable for discharge: No  Subjective: Continues to have severe abdominal pain.  Reports actually that the pain in the right lower quadrant is actually new and worsening. No nausea no vomiting.  Minimal oral intake for last 24 hours.  Passing gas.  No diarrhea.  No bleeding.  Physical Exam:  General: Appear in marked distress, no Rash; Oral Mucosa Clear, moist. no Abnormal Neck Mass Or lumps, Conjunctiva normal  Cardiovascular: S1 and S2 Present, no Murmur, Respiratory: increased respiratory effort, Bilateral Air entry present and bilateral  Crackles, no wheezes  Abdomen: Bowel Sound present, Soft and diffuse  tenderness Extremities: no Pedal edema Neurology: alert and oriented to time, place, and person affect appropriate. no new focal deficit Gait not checked due to patient safety concerns  Vitals:   10/19/19 0600 10/19/19 1313 10/19/19 1514 10/19/19 1742  BP: (!) 158/107 (!) 185/105 (!) 166/102 (!) 152/102  Pulse: (!) 106 (!) 105    Resp: 18 18    Temp: 99.1 F (37.3 C) 98.8 F (37.1 C)    TempSrc: Oral Oral    SpO2: 98% 97%    Weight:      Height:        Intake/Output Summary (Last 24 hours) at 10/19/2019 1751 Last data filed at 10/19/2019 1742 Gross per 24 hour  Intake --  Output 1475 ml  Net -1475 ml   Filed Weights   10/12/19 1541 10/13/19 1611  Weight: 95.3 kg 95.7 kg    Data Reviewed: I have personally reviewed and interpreted daily labs, tele strips, imagings as discussed above. I reviewed all nursing notes, pharmacy notes, vitals, pertinent old records I have discussed plan of care as described above with RN and patient/family.  CBC: Recent Labs  Lab 10/15/19 0419 10/15/19 0419 10/16/19 0353 10/16/19 2000 10/17/19 0836 10/18/19 0426 10/19/19 0400  WBC 2.6*  --  3.0*  --  4.1 3.6* 4.2  NEUTROABS 2.2  --  2.4  --  2.8 2.2 2.5  HGB 7.7*   < > 6.4* 9.2* 8.9* 7.9* 9.0*  HCT 26.0*   < > 20.9* 28.6* 27.9* 25.7* 29.6*  MCV 99.2  --  97.2  --  90.9 92.1 93.1  PLT 88*  --  57*  --  61* 49* 45*   < > = values in this interval not displayed.   Basic Metabolic Panel: Recent Labs  Lab 10/13/19 1115 10/13/19 1115 10/14/19 0838 10/15/19 0419 10/16/19 0353 10/18/19 0426 10/19/19 0400  NA 141   < > 140 140 140 143 142  K 3.5   < > 3.8 4.1 4.1 3.2* 3.7  CL 103   < > 104 104 106 111 110  CO2 24   < > 25 24 24 24 23   GLUCOSE 147*   < > 106* 133* 119* 95 92  BUN 8   < > 8 12 16 11 8   CREATININE 1.13   < > 0.95 1.00 0.99 0.98 0.81  CALCIUM 7.5*   < > 7.9* 8.8* 8.3* 7.7* 8.2*  MG 1.2*  --   --  2.2  --  1.7  --   PHOS 2.8  --   --   --   --  3.3  --    < > =  values in this interval not displayed.    Studies: DG CHEST PORT 1 VIEW  Result Date: 10/19/2019 CLINICAL DATA:  Pleuritic chest pain. History of metastatic lung cancer. EXAM: PORTABLE CHEST 1 VIEW COMPARISON:  October 12, 2019. FINDINGS: Stable position of right internal jugular Port-A-Cath. Increased right paratracheal density is noted concerning for worsening adenopathy or metastatic disease. Left lung is clear. No pneumothorax or pleural effusion is noted. Bony thorax is unremarkable. IMPRESSION: Increased right paratracheal density is noted concerning for worsening adenopathy or metastatic disease. Electronically Signed   By: Marijo Conception M.D.   On: 10/19/2019 13:52   DG Abd Portable 1V  Result Date: 10/19/2019 CLINICAL DATA:  Constipation. EXAM: PORTABLE ABDOMEN - 1 VIEW COMPARISON:  March 28, 2010. FINDINGS: The bowel  gas pattern is normal. No significant stool burden is noted. No radio-opaque calculi or other significant radiographic abnormality are seen. IMPRESSION: Negative. Electronically Signed   By: Marijo Conception M.D.   On: 10/19/2019 13:53    Scheduled Meds: . sodium chloride   Intravenous Once  . Chlorhexidine Gluconate Cloth  6 each Topical Daily  . dicyclomine  10 mg Oral TID AC  . DULoxetine  60 mg Oral Daily  . feeding supplement (ENSURE ENLIVE)  237 mL Oral BID BM  . fentaNYL  1 patch Transdermal Q72H  . folic acid  1 mg Oral Daily  . iohexol  1,000 mL Oral Q1H  . lidocaine  1 patch Transdermal Q24H  . metoprolol succinate  50 mg Oral Daily  . senna  1 tablet Oral QHS  . sodium chloride flush  10-40 mL Intracatheter Q12H  . sodium chloride flush  3 mL Intravenous Q12H  . vancomycin  125 mg Oral QID   Continuous Infusions: PRN Meds: acetaminophen **OR** acetaminophen, fentaNYL (SUBLIMAZE) injection, hydrALAZINE, labetalol, ondansetron, pantoprazole, polyethylene glycol, polyvinyl alcohol, sodium chloride flush  Time spent: 35 minutes  Author: Berle Mull,  MD [facility] 10/19/2019 5:51 PM  To reach On-call, see care teams to locate the attending and reach out via [email or web address]. Between 7PM-7AM, please contact night-coverage If you still have difficulty reaching the attending provider, please page the [facility] (Director on Call) for [facility] on amion for assistance.

## 2019-10-19 NOTE — Consult Note (Signed)
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 Short term f/u of this lesion was recommended. CXR on 05/01/2018 performed with recommended f/u imaging. 2) 05/10/2019: presented to the ED with headache and left pelvis pain. Patient underwent CT C/A/P and MRI brain. Brain MRI showed numerous enhancing parenchymal intracranial lesions likely reflecting metastases. CT scan showed cavitary lesion in the right lung apex with spiculated margins. Additional sites of disease include lymph nodes of the chest and numerous liver mets. In the bones there were multiple lytic appearing lesions throughout the thoracic and lumbar spine as well as portions of the bony pelvis. Referred to Oncology. 3) 05/16/2019: US biopsy of the liver performed. Findings consistent with a poorly differentiated adenocarcinoma.  4) 05/19/2019: establish care with Dr. Lorenso Courier.  5) 05/30/2019: CT simulation. Received Radiation to brain/spine.  5) 06/23/2019: Received Cycle 1 Day 1 of Carbo/Pemetrexed while inpatient. Pembrolizumab not able to be administered in inpatient setting. 6) 07/13/2019:Cycle 2 Day 1 of Carboplatin/Pemetrexed/Pembrolizumab 7)  08/04/2019:Cycle 3 day 1 of Carboplatin/Pemetrexed/Pembrolizumab 8) 08/24/2019: Cycle 4 day 1 of Carboplatin/Pemetrexed/Pembrolizumab 9) 09/14/2019: Cycle 5 day 1 of Carboplatin/Pemetrexed/Pembrolizumab 10) 10/09/2019: Cycle 6 day 1 of Pemetrexed/Pembrolizumab (held Carboplatin due to cytopenias. Intended to begin holding on cycle 7  HPI:  William Ali 34 y.o. male with medical history significant for metastatic adenocarcinoma of the lung who presented with intractable pain, nausea, and  vomiting.  Mr. Sangalang was last seen on our office on 10/09/2019 for cycle 6-day 1 of continued chemotherapy. He was admitted on 10/12/2019 and CT abdomen showed concern for colitis.   Interval History:  --antibiotics now PO vancomycin due to positive C. Diff --pain remains intractable despite altering pain regimen with assistance of palliative --RUQ abdominal pain remains 10/10  MEDICAL HISTORY:  Past Medical History:  Diagnosis Date  . Back pain   . Hypertension   . met lung ca to liver, spine, and brain dx'd 03/2019    SURGICAL HISTORY: Past Surgical History:  Procedure Laterality Date  . IR IMAGING GUIDED PORT INSERTION  06/12/2019  . KNEE SURGERY      SOCIAL HISTORY: Social History   Socioeconomic History  . Marital status: Married    Spouse name: Not on file  . Number of children: Not on file  . Years of education: Not on file  . Highest education level: Not on file  Occupational History  . Not on file  Tobacco Use  . Smoking status: Never Smoker  . Smokeless tobacco: Never Used  Vaping Use  . Vaping Use: Never used  Substance and Sexual Activity  . Alcohol use: No  . Drug use: No  . Sexual activity: Yes  Other Topics Concern  . Not on file  Social History Narrative  . Not on file   Social Determinants of Health   Financial Resource Strain:   . Difficulty of Paying Living Expenses: Not on file  Food Insecurity:   . Worried About Charity fundraiser in the Last Year: Not on file  . Ran Out of Food in the Last Year: Not on file  Transportation Needs:   . Lack of Transportation (Medical): Not on file  . Lack of Transportation (Non-Medical): Not on file  Physical Activity:   .  Days of Exercise per Week: Not on file  . Minutes of Exercise per Session: Not on file  Stress:   . Feeling of Stress : Not on file  Social Connections:   . Frequency of Communication with Friends and Family: Not on file  . Frequency of Social Gatherings with Friends and Family: Not  on file  . Attends Religious Services: Not on file  . Active Member of Clubs or Organizations: Not on file  . Attends Archivist Meetings: Not on file  . Marital Status: Not on file  Intimate Partner Violence:   . Fear of Current or Ex-Partner: Not on file  . Emotionally Abused: Not on file  . Physically Abused: Not on file  . Sexually Abused: Not on file    FAMILY HISTORY: Family History  Problem Relation Age of Onset  . Hypertension Mother   . Diabetes Father     ALLERGIES:  is allergic to onion, other, peanut oil, and peanut-containing drug products.  MEDICATIONS:  Current Facility-Administered Medications  Medication Dose Route Frequency Provider Last Rate Last Admin  . 0.9 %  sodium chloride infusion (Manually program via Guardrails IV Fluids)   Intravenous Once Neila Gear, NP   Stopped at 10/16/19 1820  . acetaminophen (TYLENOL) tablet 650 mg  650 mg Oral Q6H PRN Harold Hedge, MD   650 mg at 10/15/19 1112   Or  . acetaminophen (TYLENOL) suppository 650 mg  650 mg Rectal Q6H PRN Harold Hedge, MD      . Chlorhexidine Gluconate Cloth 2 % PADS 6 each  6 each Topical Daily Terrilee Croak, MD   6 each at 10/19/19 0905  . dicyclomine (BENTYL) capsule 10 mg  10 mg Oral TID AC Lavina Hamman, MD   10 mg at 10/19/19 1626  . DULoxetine (CYMBALTA) DR capsule 60 mg  60 mg Oral Daily [name] L, DO   60 mg at 10/19/19 0856  . feeding supplement (ENSURE ENLIVE) (ENSURE ENLIVE) liquid 237 mL  237 mL Oral BID BM Lavina Hamman, MD      . fentaNYL (DURAGESIC) 100 MCG/HR 1 patch  1 patch Transdermal Q72H Domingo Cocking, Gene, MD      . fentaNYL (SUBLIMAZE) injection 50-100 mcg  50-100 mcg Intravenous Q2H PRN Micheline Rough, MD      . folic acid (FOLVITE) tablet 1 mg  1 mg Oral Daily Harold Hedge, MD   1 mg at 10/19/19 0856  . hydrALAZINE (APRESOLINE) tablet 25 mg  25 mg Oral Q6H PRN Lavina Hamman, MD   25 mg at 10/19/19 1626  . iohexol (OMNIPAQUE) 9 MG/ML oral  solution 1,000 mL  1,000 mL Oral Q1H Lavina Hamman, MD      . labetalol (NORMODYNE) injection 10 mg  10 mg Intravenous Q2H PRN Harold Hedge, MD   10 mg at 10/14/19 1349  . lidocaine (LIDODERM) 5 % 1 patch  1 patch Transdermal Q24H Terrilee Croak, MD   1 patch at 10/19/19 1456  . metoprolol succinate (TOPROL-XL) 24 hr tablet 50 mg  50 mg Oral Daily Harold Hedge, MD   50 mg at 10/19/19 0856  . ondansetron (ZOFRAN) tablet 4 mg  4 mg Oral Q8H PRN Harold Hedge, MD   4 mg at 10/19/19 1508  . pantoprazole (PROTONIX) EC tablet 40 mg  40 mg Oral BID PRN Harold Hedge, MD   40 mg at 10/14/19 0947  . polyethylene glycol (MIRALAX /  GLYCOLAX) packet 17 g  17 g Oral Daily PRN Dahal, Binaya, MD      . polyvinyl alcohol (LIQUIFILM TEARS) 1.4 % ophthalmic solution 1 drop  1 drop Both Eyes PRN Dahal, Marlowe Aschoff, MD   1 drop at 10/15/19 1112  . senna (SENOKOT) tablet 8.6 mg  1 tablet Oral QHS Dahal, Marlowe Aschoff, MD   8.6 mg at 10/18/19 2132  . sodium chloride flush (NS) 0.9 % injection 10-40 mL  10-40 mL Intracatheter Q12H Swayze, Ava, DO   10 mL at 10/17/19 1038  . sodium chloride flush (NS) 0.9 % injection 10-40 mL  10-40 mL Intracatheter PRN Swayze, Ava, DO      . sodium chloride flush (NS) 0.9 % injection 3 mL  3 mL Intravenous Q12H Harold Hedge, MD   3 mL at 10/15/19 1113  . vancomycin (VANCOCIN) 50 mg/mL oral solution 125 mg  125 mg Oral QID Swayze, Ava, DO   125 mg at 10/19/19 1626    REVIEW OF SYSTEMS:   Constitutional: ( - ) fevers, ( - )  chills , ( - ) night sweats Eyes: ( - ) blurriness of vision, ( - ) double vision, ( - ) watery eyes Ears, nose, mouth, throat, and face: ( - ) mucositis, ( - ) sore throat Respiratory: ( - ) cough, ( - ) dyspnea, ( - ) wheezes Cardiovascular: ( - ) palpitation, ( - ) chest discomfort, ( - ) lower extremity swelling Gastrointestinal:  ( - ) nausea, ( - ) heartburn, ( - ) change in bowel habits Skin: ( - ) abnormal skin rashes Lymphatics: ( - ) new lymphadenopathy, (  - ) easy bruising Neurological: ( - ) numbness, ( - ) tingling, ( - ) new weaknesses Behavioral/Psych: ( - ) mood change, ( - ) new changes  All other systems were reviewed with the patient and are negative.  PHYSICAL EXAMINATION: ECOG PERFORMANCE STATUS: 2 - Symptomatic, <50% confined to bed  Vitals:   10/19/19 1313 10/19/19 1514  BP: (!) 185/105 (!) 166/102  Pulse: (!) 105   Resp: 18   Temp: 98.8 F (37.1 C)   SpO2: 97%    Filed Weights   10/12/19 1541 10/13/19 1611  Weight: 210 lb (95.3 kg) 211 lb (95.7 kg)    GENERAL: well appearing young [place] American male, appears in pain.  SKIN: skin color, texture, turgor are normal, no rashes or significant lesions EYES: conjunctiva are pink and non-injected, sclera clear LUNGS: clear to auscultation and percussion with normal breathing effort HEART: regular rate & rhythm and no murmurs and no lower extremity edema ABDOMEN: tender abdomen, particularly in right upper quadrant. More tender than baseline.  Musculoskeletal: no cyanosis of digits and no clubbing. PSYCH: alert & oriented x 3, fluent speech NEURO: no focal motor/sensory deficits  LABORATORY DATA:  I have reviewed the data as listed CBC Latest Ref Rng & Units 10/19/2019 10/18/2019 10/17/2019  WBC 4.0 - 10.5 K/uL 4.2 3.6(L) 4.1  Hemoglobin 13.0 - 17.0 g/dL 9.0(L) 7.9(L) 8.9(L)  Hematocrit 39 - 52 % 29.6(L) 25.7(L) 27.9(L)  Platelets 150 - 400 K/uL 45(L) 49(L) 61(L)    CMP Latest Ref Rng & Units 10/19/2019 10/18/2019 10/16/2019  Glucose 70 - 99 mg/dL 92 95 119(H)  BUN 6 - 20 mg/dL _0 Creatinine 0.61 - 1.24 mg/dL 0.81 0.98 0.99  Sodium 135 - 145 mmol/L 142 143 140  Potassium 3.5 - 5.1 mmol/L 3.7 3.2(L) 4.1  Chloride 98 -  111 mmol/L 110 111 106  CO2 22 - 32 mmol/L _0 Calcium 8.9 - 10.3 mg/dL 8.2(L) 7.7(L) 8.3(L)  Total Protein 6.5 - 8.1 g/dL - - 5.5(L)  Total Bilirubin 0.3 - 1.2 mg/dL - - 0.9  Alkaline Phos 38 - 126 U/L - - 331(H)  AST 15 - 41 U/L - -  174(H)  ALT 0 - 44 U/L - - 115(H)    RADIOGRAPHIC STUDIES: DG Chest 2 View  Result Date: 10/12/2019 CLINICAL DATA:  Chest pain, shortness of breath EXAM: CHEST - 2 VIEW COMPARISON:  06/17/2019 FINDINGS: Right medial upper lobe mass/opacity again noted. Left lung clear. Heart is normal size. No effusions. Right Port-A-Cath in place, unchanged. IMPRESSION: Right upper lobe medial masslike opacity again noted. Electronically Signed   By: Rolm Baptise M.D.   On: 10/12/2019 11:30   CT Head Wo Contrast  Result Date: 10/12/2019 CLINICAL DATA:  Headache EXAM: CT HEAD WITHOUT CONTRAST TECHNIQUE: Contiguous axial images were obtained from the base of the skull through the vertex without intravenous contrast. COMPARISON:  06/17/2019 MRI 09/06/2019 FINDINGS: Brain: Area of low-density seen in the left frontal lobe subcortical white matter. No hemorrhage or hydrocephalus. Vascular: No hyperdense vessel or unexpected calcification. Skull: No acute calvarial abnormality. Sinuses/Orbits: Mucosal thickening in the sphenoid sinuses. No change. Other: None IMPRESSION: Area of subcortical low-density in the left frontal lobe likely related to edema surrounding 1 of the many brain lesions seen on prior MRI. No hemorrhage. Electronically Signed   By: Rolm Baptise M.D.   On: 10/12/2019 14:06   CT Angio Chest PE W/Cm &/Or Wo Cm  Result Date: 10/12/2019 CLINICAL DATA:  PE suspected, metastatic lung cancer EXAM: CT ANGIOGRAPHY CHEST WITH CONTRAST TECHNIQUE: Multidetector CT imaging of the chest was performed using the standard protocol during bolus administration of intravenous contrast. Multiplanar CT image reconstructions and MIPs were obtained to evaluate the vascular anatomy. CONTRAST:  129m OMNIPAQUE IOHEXOL 350 MG/ML SOLN COMPARISON:  CT chest abdomen pelvis, 09/12/2019 FINDINGS: Cardiovascular: Satisfactory opacification of the pulmonary arteries to the segmental level. No evidence of pulmonary embolism. Normal heart  size. No pericardial effusion. Right chest port catheter Mediastinum/Nodes: Unchanged enlarged pretracheal and right hilar lymph nodes. Thyroid gland, trachea, and esophagus demonstrate no significant findings. Lungs/Pleura: New small right pleural effusion and associated atelectasis or consolidation. Unchanged post treatment appearance of a right upper lobe mass and surrounding paramedian consolidation Upper Abdomen: No acute abnormality. Musculoskeletal: No chest wall abnormality. Unchanged, extensive sclerotic osseous metastatic disease, notable for a pathologic fracture of the T3 vertebral body. Review of the MIP images confirms the above findings. IMPRESSION: 1. Negative examination for pulmonary embolism. 2. New small right pleural effusion and associated atelectasis or consolidation. 3. Unchanged post treatment appearance of a right upper lobe mass and surrounding paramedian consolidation when compared to recent staging CT. 4. Unchanged enlarged mediastinal lymph nodes and extensive sclerotic osseous metastatic disease. Electronically Signed   By: AEddie CandleM.D.   On: 10/12/2019 14:31   MR THORACIC SPINE WO CONTRAST  Result Date: 10/14/2019 CLINICAL DATA:  34year old male with metastatic lung cancer. History of T3 pathologic fracture and multilevel thoracic ventral epidural tumor in May. Back pain. EXAM: MRI THORACIC SPINE WITHOUT CONTRAST TECHNIQUE: Multiplanar, multisequence MR imaging of the thoracic spine was performed. No intravenous contrast was administered. COMPARISON:  Thoracic MRI 06/17/2019. FINDINGS: The examination had to be discontinued prior to completion due to pain. Only one set of motion degraded axial T2 weighted  images could be obtained. Limited cervical spine imaging: Subtotal involvement of the C7 vertebra by tumor. Preserved cervicothoracic junction alignment. Thoracic spine segmentation: Appears to be normal, same numbering system used in May. Alignment:  Stable since May.  Vertebrae: Diffuse thoracic bone metastases, with subtotal to complete vertebral infiltration redemonstrated at T1, T3, T6. Multilevel posterior element involvement, worst at T5 as before. Stable T3 vertebral body pathologic compression fracture. No new pathologic fracture identified in the thoracic spine. No areas of progressed marrow edema since May. Cord: No thoracic spinal cord edema has developed since May. Stable noncontrast appearance of the cord. Conus medullaris occurs below T12-L1. Paraspinal and other soft tissues: Small right pleural effusion has increased. Otherwise grossly stable visible thoracic and upper abdominal viscera. Thoracic paraspinal soft tissues have not significantly changed, including some bilateral erector spinae muscle edema related to posterior element tumor involvement. Disc levels: Motion degraded axial images today such that epidural extension of tumor and mild spinal stenosis can only be discerned from sagittal sequences. Within that limitation, the epidural tumor extension and mild spinal stenosis at T3, T5, T6, T7, and T9 appears stable since May. New mild ventral epidural tumor is suspected at T11 now, with new mild spinal stenosis there. Epidural tumor involving the bilateral T5 neural foramina has increased, but is stable at the right T6 neural foramen. IMPRESSION: 1. Largely stable diffuse thoracic spine metastases since the noncontrast MRI in May. 2. New mild epidural tumor and mild spinal stenosis at the T11 level. Increased epidural tumor in the bilateral T5 neural foramina, stable at the right T6 foramen. Stable epidural extension and mild spinal stenosis at T3, T5, T6, T7, and T9. No associated spinal cord edema. 3. Stable T3 pathologic fracture.  No new pathologic fracture. 4. Layering right pleural effusion appears increased. Electronically Signed   By: Genevie Ann M.D.   On: 10/14/2019 15:57   MR LUMBAR SPINE WO CONTRAST  Result Date: 10/14/2019 CLINICAL DATA:   35 year old male with metastatic lung cancer. History of T3 pathologic fracture and multilevel thoracic ventral epidural tumor in May. Back pain. EXAM: MRI LUMBAR SPINE WITHOUT CONTRAST TECHNIQUE: Multiplanar, multisequence MR imaging of the lumbar spine was performed. No intravenous contrast was administered. COMPARISON:  Thoracic spine MRI today reported separately. Lumbar MRI 06/17/2019. FINDINGS: Segmentation: Normal, concordant with the thoracic spine numbering today. Alignment:  Stable since May. Vertebrae: Diffuse lumbar vertebral metastatic disease again noted. Near complete tumor infiltration of the L2 vertebra as before. Increased L3 vertebral body tumor since May. Extensive left L1 and right L4 vertebral tumor appears stable. L5 involvement appears stable. Diffuse tumor infiltration of the left sacral ala and central lower sacral segments appears stable. Mild pathologic fracture of the L2 superior endplate is stable. No new pathologic fracture. Conus medullaris and cauda equina: Conus extends to the L1 level. No signal abnormality in the visible lower thoracic cord or conus. Paraspinal and other soft tissues: Stable since May. Negative visible abdominal viscera. Mild lumbar paraspinal muscle edema related to tumor. Disc levels: New ventral epidural tumor at both L1 and L2, with mild new spinal stenosis at both levels. No foraminal tumor at this time. Possible early right ventral epidural tumor now at L3, although new spinal stenosis there appears more related to increased epidural lipomatosis. Mildly increased epidural lipomatosis and associated spinal stenosis also at L4 and L5. Superimposed lumbar spine degeneration, maximal at L3-L4, is stable. IMPRESSION: 1. Diffuse lumbosacral metastatic disease re-demonstrated. Stable mild L2 pathologic fracture. No new pathologic  fracture. 2. Increased tumor infiltration of the L3 vertebra since May. Mild new ventral epidural tumor at L1, L2, and probably also L3.  New mild spinal stenosis at those levels. 3. Increased lumbar spinal stenosis elsewhere related to interval increased epidural lipomatosis. Electronically Signed   By: Genevie Ann M.D.   On: 10/14/2019 16:06   CT Abdomen Pelvis W Contrast  Result Date: 10/12/2019 CLINICAL DATA:  Abdominal pain EXAM: CT ABDOMEN AND PELVIS WITH CONTRAST TECHNIQUE: Multidetector CT imaging of the abdomen and pelvis was performed using the standard protocol following bolus administration of intravenous contrast. CONTRAST:  181m OMNIPAQUE IOHEXOL 350 MG/ML SOLN COMPARISON:  09/12/2019 FINDINGS: Lower chest: Please see separately dictated CT angiogram of the chest. Hepatobiliary: Hepatic steatosis. Numerous hypodense, rim enhancing lesions throughout the liver parenchyma, not significantly changed compared to recent prior staging CT. No gallstones, gallbladder wall thickening, or biliary dilatation. Pancreas: Unremarkable. No pancreatic ductal dilatation or surrounding inflammatory changes. Spleen: Normal in size without significant abnormality. Adrenals/Urinary Tract: Adrenal glands are unremarkable. Kidneys are normal, without renal calculi, solid lesion, or hydronephrosis. Bladder is unremarkable. Stomach/Bowel: Stomach is within normal limits. Appendix appears normal. The colon is generally decompressed, however there is some suggestion of inflammatory wall thickening, particularly in the transverse colon (series 3, image 46). Vascular/Lymphatic: No significant vascular findings are present. Unchanged enlarged celiac axis lymph node measuring 1.0 x 0.8 cm (series 3, image 26). Reproductive: No mass or other significant abnormality. Other: No abdominal wall hernia or abnormality. No abdominopelvic ascites. Musculoskeletal: Numerous sclerotic osseous lesions, not significantly changed compared to prior examination. IMPRESSION: 1. The colon is generally decompressed, however there is some suggestion of inflammatory wall thickening,  particularly in the transverse colon. Findings are suggestive of nonspecific infectious or inflammatory colitis. 2. No significant change in hepatic and osseous metastatic disease compared to recent staging CT. 3. Hepatic steatosis. 4. Please see separately dictated CT examination of the chest. Electronically Signed   By: AEddie CandleM.D.   On: 10/12/2019 14:20   DG CHEST PORT 1 VIEW  Result Date: 10/19/2019 CLINICAL DATA:  Pleuritic chest pain. History of metastatic lung cancer. EXAM: PORTABLE CHEST 1 VIEW COMPARISON:  October 12, 2019. FINDINGS: Stable position of right internal jugular Port-A-Cath. Increased right paratracheal density is noted concerning for worsening adenopathy or metastatic disease. Left lung is clear. No pneumothorax or pleural effusion is noted. Bony thorax is unremarkable. IMPRESSION: Increased right paratracheal density is noted concerning for worsening adenopathy or metastatic disease. Electronically Signed   By: JMarijo ConceptionM.D.   On: 10/19/2019 13:52   DG Abd Portable 1V  Result Date: 10/19/2019 CLINICAL DATA:  Constipation. EXAM: PORTABLE ABDOMEN - 1 VIEW COMPARISON:  March 28, 2010. FINDINGS: The bowel gas pattern is normal. No significant stool burden is noted. No radio-opaque calculi or other significant radiographic abnormality are seen. IMPRESSION: Negative. Electronically Signed   By: JMarijo ConceptionM.D.   On: 10/19/2019 13:53   VAS [place]OWER EXTREMITY VENOUS (DVT)  Result Date: 10/13/2019  Lower Venous DVTStudy Indications: Swelling.  Anticoagulation: Eliquis. Limitations: Unable to tolerate compression- left leg. Comparison Study: 06/04/19 previous Performing Technologist: MAbram SanderRVS  Examination Guidelines: A complete evaluation includes B-mode imaging, spectral Doppler, color Doppler, and power Doppler as needed of all accessible portions of each vessel. Bilateral testing is considered an integral part of a complete examination. Limited examinations for  reoccurring indications may be performed as noted. The reflux portion of the exam is performed with  the patient in reverse Trendelenburg.  +---------+---------------+---------+-----------+----------+--------------+ RIGHT    CompressibilityPhasicitySpontaneityPropertiesThrombus Aging +---------+---------------+---------+-----------+----------+--------------+ CFV      Full           Yes      Yes                                 +---------+---------------+---------+-----------+----------+--------------+ SFJ      Full                                                        +---------+---------------+---------+-----------+----------+--------------+ FV Prox  Full                                                        +---------+---------------+---------+-----------+----------+--------------+ FV Mid   Full                                                        +---------+---------------+---------+-----------+----------+--------------+ FV DistalFull                                                        +---------+---------------+---------+-----------+----------+--------------+ PFV      Full                                                        +---------+---------------+---------+-----------+----------+--------------+ POP      Full           Yes      Yes                                 +---------+---------------+---------+-----------+----------+--------------+ PTV      Full                                                        +---------+---------------+---------+-----------+----------+--------------+ PERO                                                  Not visualized +---------+---------------+---------+-----------+----------+--------------+   +---------+---------------+---------+-----------+----------+--------------+ LEFT     CompressibilityPhasicitySpontaneityPropertiesThrombus Aging  +---------+---------------+---------+-----------+----------+--------------+ CFV      Full           Yes      Yes                                 +---------+---------------+---------+-----------+----------+--------------+  SFJ      Full                                                        +---------+---------------+---------+-----------+----------+--------------+ FV Prox                 Yes      Yes                                 +---------+---------------+---------+-----------+----------+--------------+ FV Mid                  Yes      Yes                                 +---------+---------------+---------+-----------+----------+--------------+ FV Distal               Yes      Yes                                 +---------+---------------+---------+-----------+----------+--------------+ PFV                     Yes      Yes                                 +---------+---------------+---------+-----------+----------+--------------+ POP      Full           Yes      Yes                                 +---------+---------------+---------+-----------+----------+--------------+ PTV      Full                                                        +---------+---------------+---------+-----------+----------+--------------+ PERO                                                  Not visualized +---------+---------------+---------+-----------+----------+--------------+     Summary: BILATERAL: - No evidence of deep vein thrombosis seen in the lower extremities, bilaterally. - No evidence of superficial venous thrombosis in the lower extremities, bilaterally. -  LEFT: - Portions of this examination were limited- see technologist comments above.  *See table(s) above for measurements and observations. Electronically signed by Servando Snare MD on 10/13/2019 at 3:39:36 PM.    Final     ASSESSMENT & PLAN William Ali 34 y.o. male with medical history significant for  metastatic adenocarcinoma of the lung who presents to the ED and was admitted for intractable pain.  At this time the primary concern for this patient is his intractable pain.   In regards to the patient's colitis the CT imaging does show  evidence of inflammation in the transverse colon, though these findings are nonconclusive on CT scan. There are several reasons why this would not likely represent an immune colitis including the fact that he has been on therapy for 13 weeks (that normally occurs around the 6-week mark of therapy).  The CT scan findings at this time are relatively mild and therefore I do not think we should pursue an alternative etiology at this time. The most likely etiology if the patient's known C. Diff infection, for which he is on appropriate therapy.   In regards the patient's pain control we recently made alterations to his pain medication regimen.  We dropped his fentanyl patch from 100 mcg down to 75 mcg due to involuntary muscle spasms the patient was having.  Unfortunately in the transition between these 2 dosages the patient had 2 days where he was without his fentanyl patch and I do believe that that is what is causing his current flare in pain.  The imaging that he is obtained the emergency department does not show any clear evidence of progression of disease, though the MRI did show worsening compared to prior MRI (prior to treatment).  Overall I am appreciative for the assistance of palliative care in management of his symptoms at this time.  In regards to overall prognosis the patient currently has stable disease after 6 cycles of chemotherapy.  He also has a targetable mutation which would make him a candidate for additional therapy once he begins to progress.  Given this information I would strongly recommend against referral to hospice at this time as he is quite a young age and we still have therapy to offer. I did breach the topic with Mr. Goracke and his mother today,  noting if his pain is unbearable we could consider hospice measures and levels of opioid we would not otherwise be able to pursue.  #Colitis, improved --tested positive for C. Diff, on vancomycin. Stools soft, not liquid or urgent --patient has baseline abdominal pain, his current pain seems more severe, but not different in character.  --agree with continued PO vancomycin for positive C. Diff --hold on anti-motility agents in setting of C. diff --if this colitis fails to resolve with hydration and adequate antibiotic therapy may need to consider an immune colitis from his pembrolizumab, though I would consider this less likely at this time (discussion above).  --continue to monitor and hydrate per primary team.   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  -- Guardant 360 with actionable EGFR mutation noted. Anti-EGFR therapy would be his next treatment course in the event of chemo failure.  --currently on Carbo/Pem/Pem, started with Cycle 1 Day 1 on 06/23/2019.  --repeat CT scan on 09/12/2019 showed interval response to therapy. Scans in the ED show stable disease.  --I do NOT recommend referral to hospice at this time. The patient is young and tolerating therapy well. He also has a targetable mutation and next line of therapy available in the event of progression.  --I am agreeable to palliative care conversation for the purposes of pain control/symptom management  --Oncology will continue to follow while in house.   #Facial Swelling, stable --bilateral facial swelling, most pronounced in the neck bilaterally. Some lid edema and facial fullness with no marked increase in arm swelling --no worsening abdominal or LE swelling. No difficultly breathing or swallowing.  --on exam today  the cervical regions bilaterally felt stable.  --imaging shows no  evidence of lymphadenopathy. Only soft tissue swelling, possibly gravity dependant as he spends most of his day laying down.   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 and radiation oncology are following, Appreciate assistance in management of brain mets.  --pain control as noted below.  #Pain Control, worsening --pain is predominately in the back and abdomen. Of note, headaches had resolved but are now return. May be 2/2 to decreasing steroid dosages.  --decreased fentanyl patches to 75 mcg q 72H due to worsening muscle spasms at last clinic visit. Unfortunately there was a gap in his pain control where he did not have a patch x 2 days.  --appreciate the assistance of palliative care --additionally is taking cymablata 25m PO and celebrex 2024mdaily  --senna docusate/PRN miralax for opioid induced constipation --appreciate primary teams assistance in controlling his pain.  --agree with repeat CT scan as ordered by primary team.   #Pulmonary Embolism #Anticoagulation in Setting of Thrombocytopenia --Plt has dropped, likely in setting of chemotherapy --Plt down to 45 today --recommend lovenox 22m4mg BID for anticoagulation. Hold if Plt <30 --if platelets stably above 50 can restart Xarelto  Oncology will continue to follow. Please call or page with any questions or concerns regarding the care of this patient.   JohLedell PeoplesD Department of Hematology/Oncology [facility] Systemone: [phone]: [phone]: [email or web address]  10/19/2019 5:37 PM

## 2019-10-19 NOTE — Progress Notes (Signed)
MD made aware of elevated BP 166/102.  Patient in 8/10 pain - PRNs for pain given as ordered throughout the shift.

## 2019-10-20 LAB — BASIC METABOLIC PANEL
Anion gap: 10 (ref 5–15)
BUN: 7 mg/dL (ref 6–20)
CO2: 24 mmol/L (ref 22–32)
Calcium: 8.3 mg/dL — ABNORMAL LOW (ref 8.9–10.3)
Chloride: 105 mmol/L (ref 98–111)
Creatinine, Ser: 0.87 mg/dL (ref 0.61–1.24)
GFR calc Af Amer: >60 mL/min (ref >60)
GFR calc non Af Amer: >60 mL/min (ref >60)
Glucose, Bld: 94 mg/dL (ref 70–99)
Potassium: 3.5 mmol/L (ref 3.5–5.1)
Sodium: 139 mmol/L (ref 135–145)

## 2019-10-20 LAB — CBC WITH DIFFERENTIAL/PLATELET
Abs Immature Granulocytes: 0.28 10*3/uL — ABNORMAL HIGH (ref 0.00–0.07)
Basophils Absolute: 0 10*3/uL (ref 0.0–0.1)
Basophils Relative: 1 %
Eosinophils Absolute: 0 10*3/uL (ref 0.0–0.5)
Eosinophils Relative: 0 %
HCT: 29.5 % — ABNORMAL LOW (ref 39.0–52.0)
Hemoglobin: 9.1 g/dL — ABNORMAL LOW (ref 13.0–17.0)
Immature Granulocytes: 5 %
Lymphocytes Relative: 5 %
Lymphs Abs: 0.3 10*3/uL — ABNORMAL LOW (ref 0.7–4.0)
MCH: 28.3 pg (ref 26.0–34.0)
MCHC: 30.8 g/dL (ref 30.0–36.0)
MCV: 91.6 fL (ref 80.0–100.0)
Monocytes Absolute: 1.3 10*3/uL — ABNORMAL HIGH (ref 0.1–1.0)
Monocytes Relative: 24 %
Neutro Abs: 3.3 10*3/uL (ref 1.7–7.7)
Neutrophils Relative %: 65 %
Platelets: 40 10*3/uL — ABNORMAL LOW (ref 150–400)
RBC: 3.22 MIL/uL — ABNORMAL LOW (ref 4.22–5.81)
RDW: 20.1 % — ABNORMAL HIGH (ref 11.5–15.5)
WBC: 5.2 10*3/uL (ref 4.0–10.5)
nRBC: 1 % — ABNORMAL HIGH (ref 0.0–0.2)

## 2019-10-20 MED ORDER — FENTANYL CITRATE (PF) 100 MCG/2ML IJ SOLN
75.0000 ug | INTRAMUSCULAR | Status: DC | PRN
  Administered 2019-10-20 – 2019-10-21 (×6): 100 ug via INTRAVENOUS
  Filled 2019-10-20 (×6): qty 2

## 2019-10-20 MED ORDER — DEXAMETHASONE 2 MG PO TABS
2.0000 mg | ORAL_TABLET | Freq: Every day | ORAL | Status: DC
  Administered 2019-10-21 – 2019-10-28 (×8): 2 mg via ORAL
  Filled 2019-10-20 (×8): qty 1

## 2019-10-20 MED ORDER — ENOXAPARIN SODIUM 100 MG/ML ~~LOC~~ SOLN
1.0000 mg/kg | Freq: Two times a day (BID) | SUBCUTANEOUS | Status: DC
  Administered 2019-10-20 – 2019-10-23 (×7): 95 mg via SUBCUTANEOUS
  Filled 2019-10-20 (×7): qty 1

## 2019-10-20 MED ORDER — SENNOSIDES-DOCUSATE SODIUM 8.6-50 MG PO TABS
1.0000 | ORAL_TABLET | Freq: Two times a day (BID) | ORAL | Status: DC
  Administered 2019-10-20 – 2019-10-24 (×8): 1 via ORAL
  Filled 2019-10-20 (×8): qty 1

## 2019-10-20 NOTE — Progress Notes (Signed)
Patient continues to experience increased pain unrelieved by PRN medication wants to know if more pain medication can be added.

## 2019-10-20 NOTE — Progress Notes (Signed)
[facility] Progress Note  Patient: William Ali    ZES:[id number]  DOA: 10/12/2019     Date of Service: the patient was seen and examined on 10/20/2019  Brief hospital course: Past medical history of DVT on Xarelto, metastatic adenocarcinoma of the lung with mets to brain and bone as well as liver and chronic pain secondary to metastasis. Presents with complaints of worsening pain and diarrhea. Found to have transverse colitis causing severe sepsis as well as worsening of his chronic cancer related pain. Currently plan is continue pain control.  Assessment and Plan: 1.Severe sepsis, present on admission secondary to acute transverse colitis. With tachycardia tachypnea and lactic acidosis meeting sepsis criteria. Treated with IV fluids. Also treated with IV broad-spectrum antibiotics but currently antibiotics are discontinued. Patient does not have any further diarrhea. C. difficile antigen positive, antibody negative GI pathogen panel negative patient was started on oral vancomycin given his immunosuppressed status.  2.History of DVT and PE. On Xarelto at home. Currently on hold given his drop in hemoglobin and platelets. Per Dr. Lorenso Courier hematology, okay to resume Xarelto as long as platelets are more than 50. Currently pregnant remains less than 50.  3.Acute on chronic cancer-related back pain. Continues to have intractable back pain. Palliative care consulted. Appreciate assistance. Currently on fentanyl patch as well as IV fentanyl. Discontinue Toradol as needed given his worsening platelets. Patient was on steroids currently stopped. Continue baclofen. Monitor for pain control. Pain is worsening. Appreciate palliative care assistance regarding up titration of the pain medications. X-ray abdomen negative. X-ray chest shows worsening lesions. CT abdomen does not show any evidence of acute abnormality. Patient still has chronic bony metastasis as well as liver  metastasis. Radiation oncology consulted who will evaluate the patient on Monday. Pain still is an issue and palliative care is following up for that.  4.Adenocarcinoma of the lung with metastasis to bone, liver, brain. Follows up with Dr. Lorenso Courier. Patient will have outpatient follow-up with them.  5.Chemotherapy-induced pancytopenia. Platelets significantly lower along with rest of the blood count. Hematology was consulted. Currently Dr. Lorenso Courier from hematology okay with anticoagulation as long as platelets remain above 30. Lovenox 1 mg/kg ordered based on hematology recommendation.  6.Hypomagnesemia Replaced.  7.Obesity Body mass index is 33.05 kg/m. In the setting of cancer patient actually is at risk for malnutrition. Monitor.  Diet: Regular diet DVT Prophylaxis: Subcutaneous Lovenox   Advance goals of care discussion: Full code  Family Communication: family was present at bedside, at the time of interview.  The pt provided permission to discuss medical plan with the family. Opportunity was given to ask question and all questions were answered satisfactorily.   Disposition:  Status is: Inpatient  Remains inpatient appropriate because:IV treatments appropriate due to intensity of illness or inability to take PO  Dispo:  Patient From: Home  Planned Disposition: Home with Health Care Svc  Expected discharge date: 10/23/19  Medically stable for discharge: No  Subjective: Continues to report severe abdominal pain.  This time the pain is more in the lower quadrant.  Physical Exam:  General: Appear in marked distress, no Rash; Oral Mucosa Clear, moist. no Abnormal Neck Mass Or lumps, Conjunctiva normal  Cardiovascular: S1 and S2 Present, no Murmur, Respiratory: increased respiratory effort, Bilateral Air entry present and bilateral  Crackles, no wheezes Abdomen: Bowel Sound present, Soft and diffuse tenderness Extremities: bilateral Pedal edema Neurology:  alert and oriented to time, place, and person affect appropriate. no new focal deficit Gait not checked  due to patient safety concerns  Vitals:   10/19/19 1742 10/19/19 2118 10/20/19 0503 10/20/19 1308  BP: (!) 152/102 (!) 154/96 (!) 153/86 (!) 164/99  Pulse:  (!) 102 (!) 101 (!) 109  Resp:  18 18 19   Temp:  98.5 F (36.9 C) 97.6 F (36.4 C) 98.4 F (36.9 C)  TempSrc:  Oral Oral Oral  SpO2:  97% 100% 92%  Weight:      Height:        Intake/Output Summary (Last 24 hours) at 10/20/2019 1853 Last data filed at 10/20/2019 1836 Gross per 24 hour  Intake 0 ml  Output 775 ml  Net -775 ml   Filed Weights   10/12/19 1541 10/13/19 1611  Weight: 95.3 kg 95.7 kg   Data Reviewed: I have personally reviewed and interpreted daily labs, tele strips, imagings as discussed above. I reviewed all nursing notes, pharmacy notes, vitals, pertinent old records I have discussed plan of care as described above with RN and patient/family.  CBC: Recent Labs  Lab 10/16/19 0353 10/16/19 0353 10/16/19 2000 10/17/19 0836 10/18/19 0426 10/19/19 0400 10/20/19 0408  WBC 3.0*  --   --  4.1 3.6* 4.2 5.2  NEUTROABS 2.4  --   --  2.8 2.2 2.5 3.3  HGB 6.4*   < > 9.2* 8.9* 7.9* 9.0* 9.1*  HCT 20.9*   < > 28.6* 27.9* 25.7* 29.6* 29.5*  MCV 97.2  --   --  90.9 92.1 93.1 91.6  PLT 57*  --   --  61* 49* 45* 40*   < > = values in this interval not displayed.   Basic Metabolic Panel: Recent Labs  Lab 10/15/19 0419 10/16/19 0353 10/18/19 0426 10/19/19 0400 10/20/19 0408  NA 140 140 143 142 139  K 4.1 4.1 3.2* 3.7 3.5  CL 104 106 111 110 105  CO2 24 24 24 23 24   GLUCOSE 133* 119* 95 92 94  BUN 12 16 11 8 7   CREATININE 1.00 0.99 0.98 0.81 0.87  CALCIUM 8.8* 8.3* 7.7* 8.2* 8.3*  MG 2.2  --  1.7  --   --   PHOS  --   --  3.3  --   --     Studies: CT ABDOMEN PELVIS W CONTRAST  Result Date: 10/19/2019 CLINICAL DATA:  Lower abdominal pain EXAM: CT ABDOMEN AND PELVIS WITH CONTRAST TECHNIQUE:  Multidetector CT imaging of the abdomen and pelvis was performed using the standard protocol following bolus administration of intravenous contrast. CONTRAST:  133mL OMNIPAQUE IOHEXOL 300 MG/ML  SOLN COMPARISON:  CT from 10/12/2019, plain film from earlier in the same day. FINDINGS: Lower chest: Bilateral pleural effusions are noted right greater than left. Associated basilar atelectatic changes are noted. Hepatobiliary: Liver demonstrates multiple ring-enhancing lesions consistent with metastatic disease similar to that noted on the prior exam. Gallbladder is within normal limits. Fatty infiltration of the liver is seen as well. Pancreas: Unremarkable. No pancreatic ductal dilatation or surrounding inflammatory changes. Spleen: Normal in size without focal abnormality. Adrenals/Urinary Tract: Adrenal glands are within normal limits. Kidneys demonstrate a normal enhancement pattern bilaterally. No renal calculi are noted. Small cyst is noted within the right kidney. Bladder is within normal limits. Stomach/Bowel: The appendix is within normal limits. No obstructive or inflammatory changes of the colon are noted. Stomach is decompressed. Small bowel is within normal limits. Vascular/Lymphatic: No significant vascular findings are present. No enlarged abdominal or pelvic lymph nodes. Reproductive: Prostate is unremarkable. Other: No abdominal wall  hernia or abnormality. No abdominopelvic ascites. Musculoskeletal: Sclerotic foci are noted throughout the visualized bony structures consistent with metastatic disease. This is stable from the prior exam. IMPRESSION: Stable hepatic and bony metastatic disease. Fatty liver. No other focal abnormality is noted. Electronically Signed   By: Inez Catalina M.D.   On: 10/19/2019 22:58    Scheduled Meds: . sodium chloride   Intravenous Once  . Chlorhexidine Gluconate Cloth  6 each Topical Daily  . [START ON 10/21/2019] dexamethasone  2 mg Oral Daily  . dicyclomine  10 mg Oral  TID AC  . DULoxetine  60 mg Oral Daily  . enoxaparin (LOVENOX) injection  1 mg/kg Subcutaneous Q12H  . feeding supplement (ENSURE ENLIVE)  237 mL Oral BID BM  . fentaNYL  1 patch Transdermal Q72H  . folic acid  1 mg Oral Daily  . lidocaine  1 patch Transdermal Q24H  . metoprolol succinate  50 mg Oral Daily  . senna-docusate  1 tablet Oral BID  . sodium chloride flush  10-40 mL Intracatheter Q12H  . sodium chloride flush  3 mL Intravenous Q12H  . vancomycin  125 mg Oral QID   Continuous Infusions: PRN Meds: acetaminophen **OR** acetaminophen, fentaNYL (SUBLIMAZE) injection, hydrALAZINE, labetalol, ondansetron, pantoprazole, polyethylene glycol, polyvinyl alcohol, sodium chloride flush  Time spent: 35 minutes  Author: Berle Mull, MD [facility] 10/20/2019 6:53 PM  To reach On-call, see care teams to locate the attending and reach out via [email or web address]. Between 7PM-7AM, please contact night-coverage If you still have difficulty reaching the attending provider, please page the [facility] (Director on Call) for [facility] on amion for assistance.

## 2019-10-20 NOTE — Progress Notes (Addendum)
Daily Progress Note   Patient Name: William Ali       Date: 10/20/2019 DOB: November 16, 1985  Age: 34 y.o. MRN#: [id number] Attending Physician: Lavina Hamman, MD Primary Care Physician: Elsie Stain, MD Admit Date: 10/12/2019  Reason for Consultation/Follow-up: Pain control  Subjective: I saw and examined William Ali.  His mother was present at the bedside.      He was in bed resting on arrival.  He reports pain has been better controlled on IV fentanyl.  He has had a total of 1000 mcg of fentanyl over the last 24 hours in addition to his current fentanyl patch.  His mother is present at the bedside and reports that he seems to been sleeping more today and has been comfortable after receiving doses of fentanyl.  She did express concern about the amount of medication he has been receiving.  I talked with DJ about consideration for initiation of the PCA as this would allow lower doses of medication more frequently and may help [place] judge how much medication would take for him to be more comfortable.  He reports that he would not be able to tolerate the end-tidal CO2 detector and is against starting a PCA at this point.  While he does appear to be comfortable after receiving 100 mcg dose of fentanyl, he is also sleepy.  He easily arouses and I not concerned about respiratory distress based on my interaction with him today, but I do want to ensure that he is able to be as active as possible while having his pain as well controlled as possible.  He reports the 50 mcg dose of fentanyl does not relieve his pain.  In my seeing him tonight, I am worried the 100 mcg dose may cause him to be too sleepy.  I talked about trial of 75 mcg dose.  He is very concerned that this would not sufficiently relieve his  pain and states he "cannot take another night like the other night."  We therefore discussed plan to change his as needed dose of fentanyl to 75-100 mcg as needed.  I will asked the nurse to try 75 mcg dose next time that is due, but he would have the 100 mcg dose available for subsequent doses if the 75 mcg dose is ineffective.  Discussed options  for pain management with patient, Dr. Posey Pronto, and Dr. Hilma Favors.  Prior to this, Dr. Hilma Favors had restarted him on low-dose steroids as he has bone pain that is going to be difficult to control strictly with opioids.  This was subsequently stopped for some reason.  Additionally, with his right upper quadrant pain, I wonder if he is having capsular pain related to liver distention as his liver does appear to be large on recent CT scan.  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  Length of Stay: 8  Current Medications: Scheduled Meds:  . sodium chloride   Intravenous Once  . Chlorhexidine Gluconate Cloth  6 each Topical Daily  . [START ON 10/21/2019] dexamethasone  2 mg Oral Daily  . dicyclomine  10 mg Oral TID AC  . DULoxetine  60 mg Oral Daily  . enoxaparin (LOVENOX) injection  1 mg/kg Subcutaneous Q12H  . feeding supplement (ENSURE ENLIVE)  237 mL Oral BID BM  . fentaNYL  1 patch Transdermal Q72H  . folic acid  1 mg Oral Daily  . lidocaine  1 patch Transdermal Q24H  . metoprolol succinate  50 mg Oral Daily  . senna-docusate  1 tablet Oral BID  . sodium chloride flush  10-40 mL Intracatheter Q12H  . sodium chloride flush  3 mL Intravenous Q12H  . vancomycin  125 mg Oral QID    Continuous Infusions:   PRN Meds: acetaminophen **OR** acetaminophen, fentaNYL (SUBLIMAZE) injection, hydrALAZINE, labetalol, ondansetron, pantoprazole, polyethylene glycol, polyvinyl alcohol, sodium chloride  flush  Physical Exam         Resting comfortably Not in distress this morning.  Regular work of breathing S 1 S 2  Not noted to have myoclonic jerks LE at the time of my visit.  No edema  Vital Signs: BP (!) 164/99 (BP Location: Left Arm)   Pulse (!) 109   Temp 98.4 F (36.9 C) (Oral)   Resp 19   Ht 5\' 7"  (1.702 m)   Wt 95.7 kg   SpO2 92%   BMI 33.05 kg/m  SpO2: SpO2: 92 % O2 Device: O2 Device: Room Air O2 Flow Rate:    Intake/output summary:   Intake/Output Summary (Last 24 hours) at 10/20/2019 1945 Last data filed at 10/20/2019 1836 Gross per 24 hour  Intake 0 ml  Output 775 ml  Net -775 ml   LBM: Last BM Date: 10/20/19 Baseline Weight: Weight: 95.3 kg Most recent weight: Weight: 95.7 kg       Palliative Assessment/Data:    Flowsheet Rows     Most Recent Value  Intake Tab  Referral Department Hospitalist  Unit at Time of Referral ER  Palliative Care Primary Diagnosis Cancer  Date Notified 10/12/19  Palliative Care Type Return patient Palliative Care  Reason for referral Pain, Non-pain Symptom  Date of Admission 10/12/19  Date first seen by Palliative Care 10/14/19  # of days Palliative referral response time 2 Day(s)  # of days IP prior to Palliative referral 0  Clinical Assessment  Psychosocial & Spiritual Assessment  Palliative Care Outcomes      Patient Active Problem List   Diagnosis Date Noted  . Chest pain   . Generalized abdominal pain   . Prolonged Q-T interval on ECG 10/14/2019  . Hypomagnesemia 10/14/2019  . Cancer related pain 10/14/2019  .  SIRS (systemic inflammatory response syndrome) ([place]) 10/12/2019  . Sepsis ([place]) 10/12/2019  . Tachycardia 10/12/2019  . Colitis 10/12/2019  . Hypokalemia 10/12/2019  . Palliative care encounter   . Metastatic primary lung cancer ([place]) 06/17/2019  . 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 Pulmonary embolism ([place]) 06/13/2019  . DVT (deep venous thrombosis) ([place]) 06/13/2019  .  Goals of care, counseling/discussion 05/21/2019  . Metastatic adenocarcinoma to brain ([place]) 05/21/2019  . Adenocarcinoma of lung ([place]) 05/21/2019  . Essential hypertension 08/17/2018  . 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 CT scan with transverse colitis, MRI spine with increased tumor burden, stable metastatic fracture of the spine.   Assessment: Severe pain, uncontrolled at times.  Myoclonic jerks, tremor, hallucinations worse at night.  Back pain at site of T12 to S1 Electrolyte abnormalities.  PPS 40%  Recommendations/Plan: Pain, Cancer related:  Continue fentanyl 150mcg/hr patch.  He has been getting sufficient relief with 100 mcg push of fentanyl.  This has made him a little sleepy, however.  We will plan to adjust dosing to fentanyl 75-100 mcg as needed.  I have asked his nurse to trial 75 mcg dose with the next needed rescue dose.  If this is ineffective, he will still have 100 mcg dose available.  I believe he would probably benefit from initiation of PCA to better determine his overall needs, but he is not agreeable to this at this point in time. - May need to consider methadone as next step if pain remains poorly controlled with increased fentanyl.  If this is started, will need to closely monitor QTc.  Current QTc is 454 on EKG today. - Patient also on adjuvants such as Cymbalta, decadron, and baclofen.  Agree with stopping Toradol as he has low platelets and now with decrease in hemoglobin. -This is difficult case as patient has bony mets.  Often, unfortunately, opioids and of themselves are not as  effective for bony pain is other interventions.  Will therefore plan for addition again of low dose steroids. -Recommend consideration for evaluation by radiation oncology.  He was treated in May, but I think it would be worthwhile to reach out to asked them to review his case for possible treatment of bony mets.  Goals of Care and Additional Recommendations: Limitations on Scope of Treatment: Full Scope Treatment  Code Status:    Code Status Orders  (From admission, onward)         Start     Ordered   10/12/19 2242  Full code  Continuous        10/12/19 2241        Code Status History    Date Active Date Inactive Code Status Order ID Comments User Context   06/17/2019 1740 06/28/2019 2149 Full Code [id number]  Shelly Coss, MD ED   06/13/2019 1525 06/16/2019 2244 Full Code [id number]  Georgette Shell, MD ED   Advance Care Planning Activity      Prognosis:  Unable to determine  Discharge Planning: To Be Determined  Care plan was discussed with  Patient, wife, Dr. Hilma Favors, Dr. Posey Pronto and bedside RN.   Thank  you for allowing the Palliative Medicine Team to assist in the care of this patient.   Total Time 40 Prolonged Time Billed No   Greater than 50%  of this time was spent counseling and coordinating care related to the above assessment and plan.  Micheline Rough, MD  Please contact Palliative Medicine Team phone at [phone] for questions and concerns.

## 2019-10-21 LAB — CBC WITH DIFFERENTIAL/PLATELET
Abs Immature Granulocytes: 0.23 10*3/uL — ABNORMAL HIGH (ref 0.00–0.07)
Basophils Absolute: 0 10*3/uL (ref 0.0–0.1)
Basophils Relative: 0 %
Eosinophils Absolute: 0 10*3/uL (ref 0.0–0.5)
Eosinophils Relative: 0 %
HCT: 29.9 % — ABNORMAL LOW (ref 39.0–52.0)
Hemoglobin: 9.2 g/dL — ABNORMAL LOW (ref 13.0–17.0)
Immature Granulocytes: 5 %
Lymphocytes Relative: 6 %
Lymphs Abs: 0.3 10*3/uL — ABNORMAL LOW (ref 0.7–4.0)
MCH: 28.6 pg (ref 26.0–34.0)
MCHC: 30.8 g/dL (ref 30.0–36.0)
MCV: 92.9 fL (ref 80.0–100.0)
Monocytes Absolute: 1.1 10*3/uL — ABNORMAL HIGH (ref 0.1–1.0)
Monocytes Relative: 22 %
Neutro Abs: 3.3 10*3/uL (ref 1.7–7.7)
Neutrophils Relative %: 67 %
Platelets: 40 10*3/uL — ABNORMAL LOW (ref 150–400)
RBC: 3.22 MIL/uL — ABNORMAL LOW (ref 4.22–5.81)
RDW: 20.1 % — ABNORMAL HIGH (ref 11.5–15.5)
WBC: 5 10*3/uL (ref 4.0–10.5)
nRBC: 1.2 % — ABNORMAL HIGH (ref 0.0–0.2)

## 2019-10-21 LAB — BASIC METABOLIC PANEL
Anion gap: 10 (ref 5–15)
BUN: 8 mg/dL (ref 6–20)
CO2: 23 mmol/L (ref 22–32)
Calcium: 8.1 mg/dL — ABNORMAL LOW (ref 8.9–10.3)
Chloride: 104 mmol/L (ref 98–111)
Creatinine, Ser: 0.82 mg/dL (ref 0.61–1.24)
GFR calc Af Amer: >60 mL/min (ref >60)
GFR calc non Af Amer: >60 mL/min (ref >60)
Glucose, Bld: 72 mg/dL (ref 70–99)
Potassium: 3.3 mmol/L — ABNORMAL LOW (ref 3.5–5.1)
Sodium: 137 mmol/L (ref 135–145)

## 2019-10-21 MED ORDER — FENTANYL CITRATE (PF) 100 MCG/2ML IJ SOLN
75.0000 ug | INTRAMUSCULAR | Status: DC | PRN
  Administered 2019-10-21 – 2019-10-22 (×4): 75 ug via INTRAVENOUS
  Filled 2019-10-21 (×4): qty 2

## 2019-10-21 MED ORDER — POTASSIUM CHLORIDE CRYS ER 20 MEQ PO TBCR
40.0000 meq | EXTENDED_RELEASE_TABLET | ORAL | Status: AC
  Administered 2019-10-21 (×2): 40 meq via ORAL
  Filled 2019-10-21 (×2): qty 2

## 2019-10-21 NOTE — Progress Notes (Signed)
   10/21/19 1430  Assess: MEWS Score  Temp 98.5 F (36.9 C)  BP (!) 160/108  Pulse Rate (!) 113  Resp 18  SpO2 97 %  O2 Device Room Air  Assess: MEWS Score  MEWS Temp 0  MEWS Systolic 0  MEWS Pulse 2  MEWS RR 0  MEWS LOC 0  MEWS Score 2  MEWS Score Color Yellow  Assess: if the MEWS score is Yellow or Red  Were vital signs taken at a resting state? Yes  Focused Assessment No change from prior assessment  Early Detection of Sepsis Score *See Row Information* Low  MEWS guidelines implemented *See Row Information* Yes  Treat  MEWS Interventions Administered prn meds/treatments  Take Vital Signs  Increase Vital Sign Frequency  Yellow: Q 2hr X 2 then Q 4hr X 2, if remains yellow, continue Q 4hrs  Escalate  MEWS: Escalate Yellow: discuss with charge nurse/RN and consider discussing with provider and RRT  Notify: Charge Nurse/RN  Name of Charge Nurse/RN Notified Heather Bullins  Date Charge Nurse/RN Notified 10/21/19  Time Charge Nurse/RN Notified 1431   PRN hydralazine administered, Camera operator notified

## 2019-10-21 NOTE — Progress Notes (Signed)
Daily Progress Note   Patient Name: William Ali       Date: 10/21/2019 DOB: Sep 27, 1985  Age: 34 y.o. MRN#: [id number] Attending Physician: Lavina Hamman, MD Primary Care Physician: Elsie Stain, MD Admit Date: 10/12/2019  Reason for Consultation/Follow-up: Pain control  Subjective: I saw and examined William Ali.  His mother was present at the bedside.      He was in bed resting on arrival.  He reports pain has been better controlled on IV fentanyl.  He has had a total of 1000 mcg of fentanyl over the last 24 hours in addition to his current fentanyl patch.  His mother is present at the bedside and reports that he seems to been sleeping more today and has been comfortable after receiving doses of fentanyl.  She did express concern about the amount of medication he has been receiving.  I talked with William Ali about consideration for initiation of the PCA as this would allow lower doses of medication more frequently and may help [place] judge how much medication would take for him to be more comfortable.  He reports that he would not be able to tolerate the end-tidal CO2 detector and is against starting a PCA at this point.  While he does appear to be comfortable after receiving 100 mcg dose of fentanyl, he is also sleepy.  He easily arouses and I not concerned about respiratory distress based on my interaction with him today, but I do want to ensure that he is able to be as active as possible while having his pain as well controlled as possible.  He reports the 50 mcg dose of fentanyl does not relieve his pain.  In my seeing him tonight, I am worried the 100 mcg dose may cause him to be too sleepy.  I talked about trial of 75 mcg dose.  He is very concerned that this would not sufficiently relieve his  pain and states he "cannot take another night like the other night."  We therefore discussed plan to change his as needed dose of fentanyl to 75-100 mcg as needed.  I will asked the nurse to try 75 mcg dose next time that is due, but he would have the 100 mcg dose available for subsequent doses if the 75 mcg dose is ineffective.  Discussed options  for pain management with patient, William Ali, and William Ali.  Prior to this, William Ali had restarted him on low-dose steroids as he has bone pain that is going to be difficult to control strictly with opioids.  This was subsequently stopped for some reason.  Additionally, with his right upper quadrant pain, I wonder if he is having capsular pain related to liver distention as his liver does appear to be large on recent CT scan.  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  Length of Stay: 9  Current Medications: Scheduled Meds:   sodium chloride   Intravenous Once   Chlorhexidine Gluconate Cloth  6 each Topical Daily   dexamethasone  2 mg Oral Daily   dicyclomine  10 mg Oral TID AC   DULoxetine  60 mg Oral Daily   enoxaparin (LOVENOX) injection  1 mg/kg Subcutaneous Q12H   feeding supplement (ENSURE ENLIVE)  237 mL Oral BID BM   fentaNYL  1 patch Transdermal M01U   folic acid  1 mg Oral Daily   lidocaine  1 patch Transdermal Q24H   metoprolol succinate  50 mg Oral Daily   senna-docusate  1 tablet Oral BID   sodium chloride flush  10-40 mL Intracatheter Q12H   sodium chloride flush  3 mL Intravenous Q12H   vancomycin  125 mg Oral QID   PRN Meds: acetaminophen **OR** acetaminophen, fentaNYL (SUBLIMAZE) injection, hydrALAZINE, labetalol, ondansetron, pantoprazole, polyethylene glycol, polyvinyl alcohol, sodium chloride flush  Physical Exam         Resting comfortably Not in  distress this morning.  Regular work of breathing S 1 S 2  Not noted to have myoclonic jerks LE at the time of my visit.  No edema  Vital Signs: BP (!) 160/108 (BP Location: Left Arm)    Pulse (!) 113    Temp 98.5 F (36.9 C) (Oral)    Resp 18    Ht 5\' 7"  (1.702 m)    Wt 95.7 kg    SpO2 97%    BMI 33.05 kg/m  SpO2: SpO2: 97 % O2 Device: O2 Device: Room Air O2 Flow Rate:    Intake/output summary:   Intake/Output Summary (Last 24 hours) at 10/21/2019 1531 Last data filed at 10/21/2019 1156 Gross per 24 hour  Intake 0 ml  Output 1675 ml  Net -1675 ml   LBM: Last BM Date: 10/21/19 Baseline Weight: Weight: 95.3 kg Most recent weight: Weight: 95.7 kg       Palliative Assessment/Data:    Flowsheet Rows     Most Recent Value  Intake Tab  Referral Department Hospitalist  Unit at Time of Referral ER  Palliative Care Primary Diagnosis Cancer  Date Notified 10/12/19  Palliative Care Type Return patient Palliative Care  Reason for referral Pain, Non-pain Symptom  Date of Admission 10/12/19  Date first seen by Palliative Care 10/14/19  # of days Palliative referral response time 2 Day(s)  # of days IP prior to Palliative referral 0  Clinical Assessment  Psychosocial & Spiritual Assessment  Palliative Care Outcomes      Patient Active Problem List   Diagnosis Date Noted   Chest pain    Generalized abdominal pain    Prolonged Q-T interval on ECG 10/14/2019   Hypomagnesemia 10/14/2019   Cancer related pain 10/14/2019  SIRS (systemic inflammatory response syndrome) ([facility]) 10/12/2019   Sepsis ([place]) 10/12/2019   Tachycardia 10/12/2019   Colitis 10/12/2019   Hypokalemia 10/12/2019   Palliative care encounter    Metastatic primary lung cancer ([place]) 06/17/2019   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   Pulmonary embolism ([place]) 06/13/2019   DVT (deep venous thrombosis) ([place]) 06/13/2019   Goals of care, counseling/discussion 05/21/2019    Metastatic adenocarcinoma to brain [facility]) 05/21/2019   Adenocarcinoma of lung ([place]) 05/21/2019   Essential hypertension 08/17/2018   Gastroesophageal reflux disease without esophagitis 08/17/2018    Palliative Care Assessment & Plan   Patient Profile:  A 34 year old gentleman with history of DVT on Xarelto, 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 CT scan with transverse colitis, MRI spine with increased tumor burden, stable metastatic fracture of the spine.   Assessment: Severe pain, uncontrolled at times.  Myoclonic jerks, tremor, hallucinations worse at night.  Back pain at site of T12 to S1 Electrolyte abnormalities.  PPS 40%  Recommendations/Plan: Pain, Cancer related:  Continue fentanyl 136mcg/hr patch.  He has been getting sufficient relief with 100 mcg push of fentanyl but he does get sleepy.  Also, some concern today with noted myoclonic jerk but it reamins very intermittent.  Plan to trial decreased dosing of fentanyl 75 mcg every 2 hours as needed.  I believe he would probably benefit from initiation of PCA to better determine his overall needs, but he is not agreeable to this despite being agreeable to consider when talking to William Ali this morning about it. - Appreciate RN continuing to monitor and update of myoclonic jerk character and frequency. - May need to consider methadone as next step if pain remains poorly controlled with increased fentanyl or if myoclonus continues/worsens.  If this is started, will need to closely monitor QTc.  Current QTc is 454 on EKG today. - Patient also on adjuvants such as Cymbalta, decadron, and baclofen.  Toradol stopped due to low platelets and decrease in hemoglobin. -This is difficult case as patient has bony mets.  Often, unfortunately,  opioids and of themselves are not as effective for bony pain is other interventions.  Therefore, made addition again of low dose steroids.  Discussed risk vs benefit and needing to work to minimize dose as much as possible while also trying to control pain and maximize functionality. -Recommend consideration for evaluation by radiation oncology.  He was treated in May, but I think it would be worthwhile to reach out to asked them to review his case for possible treatment of bony mets.  Goals of Care and Additional Recommendations: Limitations on Scope of Treatment: Full Scope Treatment  Code Status:    Code Status Orders  (From admission, onward)         Start     Ordered   10/12/19 2242  Full code  Continuous        10/12/19 2241        Code Status History    Date Active Date Inactive Code Status Order ID Comments User Context   06/17/2019 1740 06/28/2019 2149 Full Code [id number]  Shelly Coss, MD ED   06/13/2019 1525 06/16/2019 2244 Full Code [id number]  Georgette Shell, MD ED   Advance Care Planning Activity      Prognosis:  Unable  to determine  Discharge Planning: To Be Determined  Care plan was discussed with  Patient, wife, William Ali and bedside RN.   Thank you for allowing the Palliative Medicine Team to assist in the care of this patient.   Total Time 40 Prolonged Time Billed No   Greater than 50%  of this time was spent counseling and coordinating care related to the above assessment and plan.  Micheline Rough, MD  Please contact Palliative Medicine Team phone at [phone] for questions and concerns.

## 2019-10-21 NOTE — Plan of Care (Signed)
  Problem: Clinical Measurements: Goal: Will remain free from infection Outcome: Progressing Goal: Diagnostic test results will improve Outcome: Progressing Goal: Respiratory complications will improve Outcome: Progressing Goal: Cardiovascular complication will be avoided Outcome: Progressing   Problem: Activity: Goal: Risk for activity intolerance will decrease Outcome: Progressing   Problem: Nutrition: Goal: Adequate nutrition will be maintained Outcome: Progressing   Problem: Coping: Goal: Level of anxiety will decrease Outcome: Progressing   Problem: Elimination: Goal: Will not experience complications related to bowel motility Outcome: Progressing Goal: Will not experience complications related to urinary retention Outcome: Progressing   Problem: Pain Managment: Goal: General experience of comfort will improve Outcome: Progressing   Problem: Safety: Goal: Ability to remain free from injury will improve Outcome: Progressing   Problem: Skin Integrity: Goal: Risk for impaired skin integrity will decrease Outcome: Progressing

## 2019-10-21 NOTE — Progress Notes (Signed)
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 Presents with complaints of worsening pain and diarrhea. Found to have transverse colitis causing severe sepsis as well as worsening of his chronic cancer related pain. Currently plan is continue pain control.  Assessment and Plan: 1.Severe sepsis, present on admission secondary to acute transverse colitis. With tachycardia tachypnea and lactic acidosis meeting sepsis criteria. Treated with IV fluids. Also treated with IV broad-spectrum antibiotics but currently antibiotics are discontinued. Patient does not have any further diarrhea. C. difficile antigen positive, antibody negative GI pathogen panel negative patient was started on oral vancomycin given his immunosuppressed status.  2.History of DVT and PE. On Xarelto at home. Currently on hold given his drop in hemoglobin and platelets. Per Dr. Lorenso Courier hematology, okay to resume Xarelto as long as platelets are more than 50. Currently pregnant remains less than 50.  3.Acute on chronic cancer-related back pain. Continues to have intractable back pain. Palliative care consulted. Appreciate assistance. Currently on fentanyl patch as well as IV fentanyl. Discontinue Toradol as needed given his worsening platelets. Appreciate palliative care assistance regarding up titration of the pain medications. CT abdomen does not show any evidence of acute abnormality. Patient still has chronic bony metastasis as well as liver metastasis. Radiation oncology consulted who will evaluate the patient on Monday. Pain still is an issue and palliative care is following up for that. Currently on fentanyl  patch 100 MCG along with Decadron and fentanyl as needed.  4.Adenocarcinoma of the lung with metastasis to bone, liver, brain. Follows up with Dr. Lorenso Courier. Patient will have outpatient follow-up with them.  5.Chemotherapy-induced pancytopenia. Platelets significantly lower along with rest of the blood count. Hematology was consulted. Currently Dr. Lorenso Courier from hematology okay with anticoagulation as long as platelets remain above 30. Lovenox 1 mg/kg ordered based on hematology recommendation.  6.Hypomagnesemia Replaced.  7.Obesity Body mass index is 33.05 kg/m. In the setting of cancer patient actually is at risk for malnutrition. Monitor.  8.  Myoclonic jerks. Patient appears to be having some myoclonic jerks we will continue to monitor while patient being on pain medication. Discussed with patient regarding utility of PCA and he currently agrees to use it in case we need to use it.  Diet: Regular diet DVT Prophylaxis: Subcutaneous Lovenox   Advance goals of care discussion: Full code  Family Communication: no family was present at bedside, at the time of interview.   Disposition:  Status is: Inpatient  Remains inpatient appropriate because:IV treatments appropriate due to intensity of illness or inability to take PO  Dispo:  Patient From: Home  Planned Disposition: Home with Health Care Svc  Expected discharge date: 10/23/19  Medically stable for discharge: No  Subjective: Pain well controlled.  No nausea no vomiting.  No fever no chills.  Had a BM earlier.  Physical Exam:  General: Appear in mild distress, no Rash; Oral Mucosa Clear, moist. no Abnormal Neck Mass Or lumps, Conjunctiva normal  Cardiovascular: S1 and S2 Present, no Murmur, Respiratory: increased respiratory effort, Bilateral Air entry present and bilateral  Crackles, no wheezes Abdomen: Bowel Sound present, Soft and diffuse tenderness Extremities: bilateral Pedal edema Neurology: alert and  oriented to  time, place, and person affect appropriate. no new focal deficit, occasional myoclonic jerking Gait not checked due to patient safety concerns  Vitals:   10/21/19 1300 10/21/19 1430 10/21/19 1635 10/21/19 1831  BP: (!) 157/108 (!) 160/108 (!) 144/99 (!) 142/98  Pulse: (!) 119 (!) 113 (!) 106 (!) 108  Resp: 18 18  18   Temp: 98.8 F (37.1 C) 98.5 F (36.9 C) 97.8 F (36.6 C) 98.3 F (36.8 C)  TempSrc: Oral Oral Oral Oral  SpO2: 96% 97% 98% 98%  Weight:      Height:        Intake/Output Summary (Last 24 hours) at 10/21/2019 1927 Last data filed at 10/21/2019 1700 Gross per 24 hour  Intake --  Output 1750 ml  Net -1750 ml   Filed Weights   10/12/19 1541 10/13/19 1611  Weight: 95.3 kg 95.7 kg   Data Reviewed: I have personally reviewed and interpreted daily labs, tele strips, imagings as discussed above. I reviewed all nursing notes, pharmacy notes, vitals, pertinent old records I have discussed plan of care as described above with RN and patient/family.  CBC: Recent Labs  Lab 10/17/19 0836 10/18/19 0426 10/19/19 0400 10/20/19 0408 10/21/19 0344  WBC 4.1 3.6* 4.2 5.2 5.0  NEUTROABS 2.8 2.2 2.5 3.3 3.3  HGB 8.9* 7.9* 9.0* 9.1* 9.2*  HCT 27.9* 25.7* 29.6* 29.5* 29.9*  MCV 90.9 92.1 93.1 91.6 92.9  PLT 61* 49* 45* 40* 40*   Basic Metabolic Panel: Recent Labs  Lab 10/15/19 0419 10/15/19 0419 10/16/19 0353 10/18/19 0426 10/19/19 0400 10/20/19 0408 10/21/19 0344  NA 140   < > 140 143 142 139 137  K 4.1   < > 4.1 3.2* 3.7 3.5 3.3*  CL 104   < > 106 111 110 105 104  CO2 24   < > 24 24 23 24 23   GLUCOSE 133*   < > 119* 95 92 94 72  BUN 12   < > 16 11 8 7 8   CREATININE 1.00   < > 0.99 0.98 0.81 0.87 0.82  CALCIUM 8.8*   < > 8.3* 7.7* 8.2* 8.3* 8.1*  MG 2.2  --   --  1.7  --   --   --   PHOS  --   --   --  3.3  --   --   --    < > = values in this interval not displayed.    Studies: No results found.  Scheduled Meds: . sodium chloride    Intravenous Once  . Chlorhexidine Gluconate Cloth  6 each Topical Daily  . dexamethasone  2 mg Oral Daily  . dicyclomine  10 mg Oral TID AC  . DULoxetine  60 mg Oral Daily  . enoxaparin (LOVENOX) injection  1 mg/kg Subcutaneous Q12H  . feeding supplement (ENSURE ENLIVE)  237 mL Oral BID BM  . fentaNYL  1 patch Transdermal Q72H  . folic acid  1 mg Oral Daily  . lidocaine  1 patch Transdermal Q24H  . metoprolol succinate  50 mg Oral Daily  . senna-docusate  1 tablet Oral BID  . sodium chloride flush  10-40 mL Intracatheter Q12H  . sodium chloride flush  3 mL Intravenous Q12H  . vancomycin  125 mg Oral QID   Continuous Infusions: PRN Meds: acetaminophen **OR** acetaminophen, fentaNYL (SUBLIMAZE) injection, hydrALAZINE, labetalol, ondansetron, pantoprazole, polyethylene glycol, polyvinyl alcohol, sodium chloride flush  Time spent: 35 minutes  Author: Berle Mull, MD [facility] 10/21/2019  7:27 PM  To reach On-call, see care teams to locate the attending and reach out via [email or web address]. Between 7PM-7AM, please contact night-coverage If you still have difficulty reaching the attending provider, please page the [facility] (Director on Call) for [facility] on amion for assistance.

## 2019-10-22 LAB — BASIC METABOLIC PANEL
Anion gap: 12 (ref 5–15)
BUN: 10 mg/dL (ref 6–20)
CO2: 25 mmol/L (ref 22–32)
Calcium: 8.9 mg/dL (ref 8.9–10.3)
Chloride: 105 mmol/L (ref 98–111)
Creatinine, Ser: 0.91 mg/dL (ref 0.61–1.24)
GFR calc Af Amer: >60 mL/min (ref >60)
GFR calc non Af Amer: >60 mL/min (ref >60)
Glucose, Bld: 149 mg/dL — ABNORMAL HIGH (ref 70–99)
Potassium: 4.3 mmol/L (ref 3.5–5.1)
Sodium: 142 mmol/L (ref 135–145)

## 2019-10-22 LAB — AMMONIA: Ammonia: 23 umol/L (ref 9–35)

## 2019-10-22 LAB — CBC WITH DIFFERENTIAL/PLATELET
Abs Immature Granulocytes: 0.23 10*3/uL — ABNORMAL HIGH (ref 0.00–0.07)
Basophils Absolute: 0 10*3/uL (ref 0.0–0.1)
Basophils Relative: 0 %
Eosinophils Absolute: 0 10*3/uL (ref 0.0–0.5)
Eosinophils Relative: 0 %
HCT: 30.2 % — ABNORMAL LOW (ref 39.0–52.0)
Hemoglobin: 9.3 g/dL — ABNORMAL LOW (ref 13.0–17.0)
Immature Granulocytes: 3 %
Lymphocytes Relative: 5 %
Lymphs Abs: 0.4 10*3/uL — ABNORMAL LOW (ref 0.7–4.0)
MCH: 28.2 pg (ref 26.0–34.0)
MCHC: 30.8 g/dL (ref 30.0–36.0)
MCV: 91.5 fL (ref 80.0–100.0)
Monocytes Absolute: 1.2 10*3/uL — ABNORMAL HIGH (ref 0.1–1.0)
Monocytes Relative: 15 %
Neutro Abs: 6.3 10*3/uL (ref 1.7–7.7)
Neutrophils Relative %: 77 %
Platelets: 59 10*3/uL — ABNORMAL LOW (ref 150–400)
RBC: 3.3 MIL/uL — ABNORMAL LOW (ref 4.22–5.81)
RDW: 19.9 % — ABNORMAL HIGH (ref 11.5–15.5)
WBC: 8.1 10*3/uL (ref 4.0–10.5)
nRBC: 1 % — ABNORMAL HIGH (ref 0.0–0.2)

## 2019-10-22 LAB — HEPATIC FUNCTION PANEL
ALT: 92 U/L — ABNORMAL HIGH (ref 0–44)
AST: 119 U/L — ABNORMAL HIGH (ref 15–41)
Albumin: 2.6 g/dL — ABNORMAL LOW (ref 3.5–5.0)
Alkaline Phosphatase: 380 U/L — ABNORMAL HIGH (ref 38–126)
Bilirubin, Direct: 0.3 mg/dL — ABNORMAL HIGH (ref 0.0–0.2)
Indirect Bilirubin: 0.3 mg/dL (ref 0.3–0.9)
Total Bilirubin: 0.6 mg/dL (ref 0.3–1.2)
Total Protein: 6.3 g/dL — ABNORMAL LOW (ref 6.5–8.1)

## 2019-10-22 MED ORDER — SODIUM CHLORIDE 0.9% FLUSH: 9.0000 mL | INTRAVENOUS | Status: DC | PRN

## 2019-10-22 MED ORDER — DIPHENHYDRAMINE HCL 50 MG/ML IJ SOLN
12.5000 mg | Freq: Four times a day (QID) | INTRAMUSCULAR | Status: DC | PRN
  Administered 2019-10-26: 12.5 mg via INTRAVENOUS
  Filled 2019-10-22: qty 1

## 2019-10-22 MED ORDER — DIPHENHYDRAMINE HCL 12.5 MG/5ML PO ELIX: 12.5000 mg | ORAL_SOLUTION | Freq: Four times a day (QID) | ORAL | Status: DC | PRN

## 2019-10-22 MED ORDER — FENTANYL 50 MCG/ML IV PCA SOLN
INTRAVENOUS | Status: DC
  Administered 2019-10-23: 335.3 ug via INTRAVENOUS
  Administered 2019-10-23: 123.7 ug via INTRAVENOUS
  Administered 2019-10-23: 140.7 ug via INTRAVENOUS
  Administered 2019-10-23: 191.7 ug via INTRAVENOUS
  Administered 2019-10-23: 351.5 ug via INTRAVENOUS
  Administered 2019-10-24: 167.2 ug via INTRAVENOUS
  Administered 2019-10-24: 145 ug via INTRAVENOUS
  Administered 2019-10-24: 220 ug via INTRAVENOUS
  Administered 2019-10-24: 189.3 ug via INTRAVENOUS
  Administered 2019-10-24: 236.2 ug/h via INTRAVENOUS
  Administered 2019-10-24: 175.4 ug via INTRAVENOUS
  Administered 2019-10-25: 178.3 ug via INTRAVENOUS
  Administered 2019-10-25: 120.68 ug via INTRAVENOUS
  Administered 2019-10-25: 227 ug via INTRAVENOUS
  Administered 2019-10-25: 327.3 ug via INTRAVENOUS
  Filled 2019-10-22 (×4): qty 20

## 2019-10-22 MED ORDER — NALOXONE HCL 0.4 MG/ML IJ SOLN: 0.4000 mg | INTRAMUSCULAR | Status: DC | PRN

## 2019-10-22 MED ORDER — FENTANYL 50 MCG/ML IV PCA SOLN
INTRAVENOUS | Status: DC
  Administered 2019-10-22: 50 ug via INTRAVENOUS
  Administered 2019-10-22: 100 ug via INTRAVENOUS
  Filled 2019-10-22: qty 20

## 2019-10-22 NOTE — Plan of Care (Signed)
  Problem: Clinical Measurements: Goal: Will remain free from infection Outcome: Progressing Goal: Diagnostic test results will improve Outcome: Progressing Goal: Respiratory complications will improve Outcome: Progressing Goal: Cardiovascular complication will be avoided Outcome: Progressing   Problem: Activity: Goal: Risk for activity intolerance will decrease Outcome: Progressing   Problem: Nutrition: Goal: Adequate nutrition will be maintained Outcome: Progressing   Problem: Coping: Goal: Level of anxiety will decrease Outcome: Progressing   Problem: Elimination: Goal: Will not experience complications related to bowel motility Outcome: Progressing Goal: Will not experience complications related to urinary retention Outcome: Progressing   Problem: Pain Managment: Goal: General experience of comfort will improve Outcome: Progressing   Problem: Safety: Goal: Ability to remain free from injury will improve Outcome: Progressing   Problem: Skin Integrity: Goal: Risk for impaired skin integrity will decrease Outcome: Progressing

## 2019-10-22 NOTE — Progress Notes (Signed)
Daily Progress Note   Patient Name: William Ali       Date: 10/22/2019 DOB: 1985-06-26  Age: 34 y.o. MRN#: [id number] Attending Physician: William Hamman, MD Primary Care Physician: William Stain, MD Admit Date: 10/12/2019  Reason for Consultation/Follow-up: Pain control  Subjective: I saw and examined William Ali.    He reports that pain has improved somewhat, however, he has been having increased frequency of myoclonic jerks.  He has also been noted to have periods where he seems to be more confused and concern is that he is having hallucinations.  I talked through options with him for pain management moving forward.  My recommendation was to consider initiation of fentanyl PCA.  We talked through thought process behind this being a way to better control his pain while allowing him to have less medication and also discussed working to transition off of fentanyl patch in case we need to make a rotation to methadone.  Today, he was agreeable to this but asked me to call and discuss with his wife.  I called and was able to discuss with patient his wife.  We reviewed plan for transition to patient had reasoning for this as well as having further discussion that we may need to consider rotating to methadone if he continues to have myoclonic jerks and/or hallucinations.  She expressed understanding was agreeable.  Length of Stay: 10  Current Medications: Scheduled Meds:  . sodium chloride   Intravenous Once  . Chlorhexidine Gluconate Cloth  6 each Topical Daily  . dexamethasone  2 mg Oral Daily  . dicyclomine  10 mg Oral TID AC  . DULoxetine  60 mg Oral Daily  . enoxaparin (LOVENOX) injection  1 mg/kg Subcutaneous Q12H  . feeding supplement (ENSURE ENLIVE)  237 mL Oral BID BM  .  fentaNYL   Intravenous P2R  . folic acid  1 mg Oral Daily  . lidocaine  1 patch Transdermal Q24H  . metoprolol succinate  50 mg Oral Daily  . senna-docusate  1 tablet Oral BID  . sodium chloride flush  10-40 mL Intracatheter Q12H  . sodium chloride flush  3 mL Intravenous Q12H  . vancomycin  125 mg Oral QID   PRN Meds: acetaminophen **OR** acetaminophen, diphenhydrAMINE **OR** diphenhydrAMINE, fentaNYL (SUBLIMAZE) injection, hydrALAZINE, labetalol, naloxone **AND** sodium  chloride flush, ondansetron, pantoprazole, polyethylene glycol, polyvinyl alcohol, sodium chloride flush  Physical Exam         Resting comfortably Not in distress this morning.  Regular work of breathing S 1 S 2  Intermittent myoclonic jerks noted No edema  Vital Signs: BP (!) 147/92 (BP Location: Left Arm)   Pulse (!) 101   Temp 98.1 F (36.7 C) (Oral)   Resp (!) 24   Ht 5\' 7"  (1.702 m)   Wt 95.7 kg   SpO2 96%   BMI 33.05 kg/m  SpO2: SpO2: 96 % O2 Device: O2 Device: Room Air O2 Flow Rate:    Intake/output summary:   Intake/Output Summary (Last 24 hours) at 10/22/2019 1611 Last data filed at 10/22/2019 1524 Gross per 24 hour  Intake 220 ml  Output 1200 ml  Net -980 ml   LBM: Last BM Date: 10/21/19 Baseline Weight: Weight: 95.3 kg Most recent weight: Weight: 95.7 kg       Palliative Assessment/Data:    Flowsheet Rows     Most Recent Value  Intake Tab  Referral Department Hospitalist  Unit at Time of Referral ER  Palliative Care Primary Diagnosis Cancer  Date Notified 10/12/19  Palliative Care Type Return patient Palliative Care  Reason for referral Pain, Non-pain Symptom  Date of Admission 10/12/19  Date first seen by Palliative Care 10/14/19  # of days Palliative referral response time 2 Day(s)  # of days IP prior to Palliative referral 0  Clinical Assessment  Psychosocial & Spiritual Assessment  Palliative Care Outcomes      Patient Active Problem List   Diagnosis Date Noted   . Chest pain   . Generalized abdominal pain   . Prolonged Q-T interval on ECG 10/14/2019  . Hypomagnesemia 10/14/2019  . Cancer related pain 10/14/2019  . SIRS (systemic inflammatory response syndrome) ([place]) 10/12/2019  . Sepsis ([place]) 10/12/2019  . Tachycardia 10/12/2019  . Colitis 10/12/2019  . Hypokalemia 10/12/2019  . Palliative care encounter   . Metastatic primary lung cancer ([place]) 06/17/2019  . 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 Pulmonary embolism ([place]) 06/13/2019  . DVT (deep venous thrombosis) ([place]) 06/13/2019  . Goals of care, counseling/discussion 05/21/2019  . Metastatic adenocarcinoma to brain ([place]) 05/21/2019  . Adenocarcinoma of lung ([place]) 05/21/2019  . Essential hypertension 08/17/2018  . 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 CT scan with transverse colitis, MRI spine with increased tumor burden, stable metastatic fracture of the spine.   Assessment: Severe pain, uncontrolled at times.  Myoclonic jerks, tremor, hallucinations worse at night.  Back pain at site of T12 to S1 Electrolyte abnormalities.  PPS 40%  Recommendations/Plan: Pain, Cancer related: He continues to have myoclonic jerks.  Also noted to have periods of questionable hallucinations/confusion.  Discussed with William Ali and while we will continue with fentanyl currently, I am going to start him on a PCA to see if we can control his pain without giving him more medication than absolutely necessary.  Additionally, I am going to work to transition off of fentanyl patch and will supplement with  equivalent amount of continuous fentanyl as this  would be easier to titrate quickly, and it would also be necessary to stop fentanyl patch if we make transition to methadone for his long-acting agent.  Plan to start fentanyl PCA with 25 mcg bolus dose with a 15-minute lockout.  Also plan to remove 100 mcg fentanyl patch.  Will start basal rate at 50% of this (50 mcg/h) 6 to 8 hours after removal of fentanyl patch.  We can then evaluate and potentially increase basal rate tomorrow based upon his pain control and use overnight. - May need to consider methadone as next step if pain remains poorly controlled with increased fentanyl or if myoclonus continues/worsens.  If this is started, will need to closely monitor QTc.  Most recent QTc is 454 on EKG. - Patient also on adjuvants such as Cymbalta, decadron, and baclofen.  Toradol stopped due to low platelets and decrease in hemoglobin. -This is difficult case as patient has bony mets.  Often, unfortunately, opioids and of themselves are not as effective for bony pain is other interventions.  Therefore, made addition again of low dose steroids.  Discussed risk vs benefit and needing to work to minimize dose as much as possible while also trying to control pain and maximize functionality. -Recommend consideration for evaluation by radiation oncology.  He was treated in May, but I think it would be worthwhile to reach out to asked them to review his case for possible treatment of bony mets.  Goals of Care and Additional Recommendations: Limitations on Scope of Treatment: Full Scope Treatment  Code Status:    Code Status Orders  (From admission, onward)         Start     Ordered   10/12/19 2242  Full code  Continuous        10/12/19 2241        Code Status History    Date Active Date Inactive Code Status Order ID Comments User Context   06/17/2019 1740 06/28/2019 2149 Full Code [id number]  Shelly Coss, MD ED   06/13/2019 1525 06/16/2019 2244 Full Code  [id number]  Georgette Shell, MD ED   Advance Care Planning Activity      Prognosis:  Unable to determine  Discharge Planning: To Be Determined  Care plan was discussed with  Patient, wife, William Ali and bedside RN.   Thank you for allowing the Palliative Medicine Team to assist in the care of this patient.   Total Time 50 Prolonged Time Billed No   Greater than 50%  of this time was spent counseling and coordinating care related to the above assessment and plan.  Micheline Rough, MD  Please contact Palliative Medicine Team phone at [phone] for questions and concerns.

## 2019-10-22 NOTE — Progress Notes (Addendum)
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 Presents with complaints of worsening pain and diarrhea. Found to have transverse colitis causing severe sepsis as well as worsening of his chronic cancer related pain. Currently plan is continue pain control.  Assessment and Plan: 1. Acute on chronic cancer-related back pain. Myoclonic jerks. Visual and auditory hallucination. Tremors. Continues to have intractable back pain. Palliative care consulted. Appreciate assistance. Currently on fentanyl patch as well as IV fentanyl. Patient was on Toradol but currently on hold secondary to low platelets. Patient was on Decadron which was on hold briefly in early resume. Due to persistent abdominal pain CT abdomen was performed, does not show any evidence of acute abnormality that can explain patient's pain other than his metastatic lesions. Radiation oncology consulted who will evaluate the patient on Monday. Patient is currently on 100 mics of fentanyl patch along with as needed fentanyl pushes. Multiple discussions were held with patient as well as wife due to concern with medication side effects. Patient currently agreeable to try PCA for dose finding. Plan is to start fentanyl PCA with 25 MCG bolus is 15 minutes lockout. Renew fentanyl patch that is currently on which is 100 MCG. Start basal rate at 50 MCG per hour 6 to 8 hours after removal of the patch. Plan monitor and further titrate the basal rate based on requirement and control. Palliative care is also considering possible transition to methadone.  2. Severe sepsis, present on admission secondary to acute transverse colitis. With  tachycardia tachypnea and lactic acidosis meeting sepsis criteria. Treated with IV fluids. Also treated with IV broad-spectrum antibiotics but currently antibiotics are discontinued. Patient does not have any further diarrhea. C. difficile antigen positive, antibody negative GI pathogen panel negative patient was started on oral vancomycin given his immunosuppressed status.  3. History of DVT and PE. On Xarelto at home. Currently on hold given his drop in hemoglobin and platelets. Per Dr. Lorenso Courier hematology, okay to resume Xarelto as long as platelets are more than 50. Currently platelets are 59.  Will monitor for continued trend and transitioning of Lovenox.  4.Adenocarcinoma of the lung with metastasis to bone, liver, brain. Follows up with Dr. Lorenso Courier. Patient will have outpatient follow-up with them.  5.Chemotherapy-induced pancytopenia. Platelets significantly lower along with rest of the blood count. Hematology was consulted. Currently Dr. Lorenso Courier from hematology okay with anticoagulation as long as platelets remain above 30. Lovenox 1 mg/kg ordered based on hematology recommendation.  6.Hypomagnesemia Replaced.  7.Obesity Body mass index is 33.05 kg/m. In the setting of cancer patient actually is at risk for malnutrition. Monitor.  8.  Delirium May require telesitter.  9.  LFT elevation. Likely in response to liver involvement with metastasis. Continue to monitor for now.  Diet: Cardiac diet DVT Prophylaxis: Therapeutic Anticoagulation with Lovenox      Advance goals of care discussion: Full code  Family Communication: family was present at bedside, at the time of interview.  The pt provided permission to discuss medical plan with the family. Opportunity was given to ask question and all questions were answered satisfactorily.   Disposition:  Status is: Inpatient  Remains inpatient appropriate because:Ongoing active pain requiring inpatient  pain  management   Dispo:  Patient From: Home  Planned Disposition: Home with Health Care Svc  Expected discharge date: To be determined  Medically stable for discharge: No         Subjective: Continues to have pain rated at 4 out of 10.  Per wife patient having conversation with people who were not in the room trying to constantly grab things up in the air sleepy with conversation.  Also confused during conversation.  1 bowel movement today without any diarrhea.  No bleeding.  Physical Exam:  General: Appear in mild distress, no Rash; Oral Mucosa Clear, moist. no Abnormal Neck Mass Or lumps, Conjunctiva normal  Cardiovascular: S1 and S2 Present, no Murmur, Respiratory: good respiratory effort, Bilateral Air entry present and bilateral  Crackles, no wheezes Abdomen: Bowel Sound present, Soft and diffuse tenderness Extremities: no Pedal edema Neurology: alert and oriented to place and person affect anxious. no new focal deficit Gait not checked due to patient safety concerns  Vitals:   10/22/19 0528 10/22/19 1507 10/22/19 1514 10/22/19 1725  BP: (!) 143/89  (!) 147/92 (!) 139/96  Pulse: (!) 104  (!) 101 99  Resp: (!) 24 20 (!) 24   Temp: 97.6 F (36.4 C)  98.1 F (36.7 C) 97.7 F (36.5 C)  TempSrc: Oral  Oral Oral  SpO2: 99% 94% 96% 99%  Weight:      Height:        Intake/Output Summary (Last 24 hours) at 10/22/2019 1916 Last data filed at 10/22/2019 1700 Gross per 24 hour  Intake 220 ml  Output 1200 ml  Net -980 ml   Filed Weights   10/12/19 1541 10/13/19 1611  Weight: 95.3 kg 95.7 kg    Data Reviewed: I have personally reviewed and interpreted daily labs, tele strips, imagings as discussed above. I reviewed all nursing notes, pharmacy notes, vitals, pertinent old records I have discussed plan of care as described above with RN and patient/family.  CBC: Recent Labs  Lab 10/18/19 0426 10/19/19 0400 10/20/19 0408 10/21/19 0344 10/22/19 0542  WBC 3.6* 4.2 5.2  5.0 8.1  NEUTROABS 2.2 2.5 3.3 3.3 6.3  HGB 7.9* 9.0* 9.1* 9.2* 9.3*  HCT 25.7* 29.6* 29.5* 29.9* 30.2*  MCV 92.1 93.1 91.6 92.9 91.5  PLT 49* 45* 40* 40* 59*   Basic Metabolic Panel: Recent Labs  Lab 10/18/19 0426 10/19/19 0400 10/20/19 0408 10/21/19 0344 10/22/19 0542  NA 143 142 139 137 142  K 3.2* 3.7 3.5 3.3* 4.3  CL 111 110 105 104 105  CO2 24 23 24 23 25   GLUCOSE 95 92 94 72 149*  BUN 11 8 7 8 10   CREATININE 0.98 0.81 0.87 0.82 0.91  CALCIUM 7.7* 8.2* 8.3* 8.1* 8.9  MG 1.7  --   --   --   --   PHOS 3.3  --   --   --   --     Studies: No results found.  Scheduled Meds: . sodium chloride   Intravenous Once  . Chlorhexidine Gluconate Cloth  6 each Topical Daily  . dexamethasone  2 mg Oral Daily  . dicyclomine  10 mg Oral TID AC  . DULoxetine  60 mg Oral Daily  . enoxaparin (LOVENOX) injection  1 mg/kg Subcutaneous Q12H  . feeding supplement (ENSURE ENLIVE)  237 mL Oral BID BM  . fentaNYL   Intravenous G8Z  . folic acid  1 mg Oral Daily  . lidocaine  1 patch Transdermal  Q24H  . metoprolol succinate  50 mg Oral Daily  . senna-docusate  1 tablet Oral BID  . sodium chloride flush  10-40 mL Intracatheter Q12H  . sodium chloride flush  3 mL Intravenous Q12H  . vancomycin  125 mg Oral QID   Continuous Infusions: PRN Meds: acetaminophen **OR** acetaminophen, diphenhydrAMINE **OR** diphenhydrAMINE, fentaNYL (SUBLIMAZE) injection, hydrALAZINE, labetalol, naloxone **AND** sodium chloride flush, ondansetron, pantoprazole, polyethylene glycol, polyvinyl alcohol, sodium chloride flush  Time spent: 35 minutes  Author: Berle Mull, MD [facility] 10/22/2019 7:16 PM  To reach On-call, see care teams to locate the attending and reach out via [email or web address]. Between 7PM-7AM, please contact night-coverage If you still have difficulty reaching the attending provider, please page the [facility] (Director on Call) for [facility] on amion for assistance.

## 2019-10-22 NOTE — Progress Notes (Signed)
I called and spoke with bedside RN.  DJ has been having trouble remembering to use PCA button but utilizes it appropriately when reminded.  He is not in distress and is not overly sedated.  Discussed that fentanyl patch was removed and plan to start basal rate at 50% of patch rate (30mcg/hr).  Will follow up for further titration tomorrow.  Micheline Rough, MD [place] Team [phone]

## 2019-10-23 LAB — CBC WITH DIFFERENTIAL/PLATELET
Abs Immature Granulocytes: 0.17 10*3/uL — ABNORMAL HIGH (ref 0.00–0.07)
Basophils Absolute: 0 10*3/uL (ref 0.0–0.1)
Basophils Relative: 0 %
Eosinophils Absolute: 0 10*3/uL (ref 0.0–0.5)
Eosinophils Relative: 0 %
HCT: 29.8 % — ABNORMAL LOW (ref 39.0–52.0)
Hemoglobin: 9 g/dL — ABNORMAL LOW (ref 13.0–17.0)
Immature Granulocytes: 2 %
Lymphocytes Relative: 4 %
Lymphs Abs: 0.4 10*3/uL — ABNORMAL LOW (ref 0.7–4.0)
MCH: 28.2 pg (ref 26.0–34.0)
MCHC: 30.2 g/dL (ref 30.0–36.0)
MCV: 93.4 fL (ref 80.0–100.0)
Monocytes Absolute: 1.3 10*3/uL — ABNORMAL HIGH (ref 0.1–1.0)
Monocytes Relative: 16 %
Neutro Abs: 6.5 10*3/uL (ref 1.7–7.7)
Neutrophils Relative %: 78 %
Platelets: 87 10*3/uL — ABNORMAL LOW (ref 150–400)
RBC: 3.19 MIL/uL — ABNORMAL LOW (ref 4.22–5.81)
RDW: 20 % — ABNORMAL HIGH (ref 11.5–15.5)
WBC: 8.3 10*3/uL (ref 4.0–10.5)
nRBC: 1.1 % — ABNORMAL HIGH (ref 0.0–0.2)

## 2019-10-23 LAB — COMPREHENSIVE METABOLIC PANEL
ALT: 96 U/L — ABNORMAL HIGH (ref 0–44)
AST: 121 U/L — ABNORMAL HIGH (ref 15–41)
Albumin: 2.8 g/dL — ABNORMAL LOW (ref 3.5–5.0)
Alkaline Phosphatase: 364 U/L — ABNORMAL HIGH (ref 38–126)
Anion gap: 11 (ref 5–15)
BUN: 9 mg/dL (ref 6–20)
CO2: 24 mmol/L (ref 22–32)
Calcium: 8.3 mg/dL — ABNORMAL LOW (ref 8.9–10.3)
Chloride: 106 mmol/L (ref 98–111)
Creatinine, Ser: 0.91 mg/dL (ref 0.61–1.24)
GFR calc Af Amer: >60 mL/min (ref >60)
GFR calc non Af Amer: >60 mL/min (ref >60)
Glucose, Bld: 87 mg/dL (ref 70–99)
Potassium: 3.7 mmol/L (ref 3.5–5.1)
Sodium: 141 mmol/L (ref 135–145)
Total Bilirubin: 0.8 mg/dL (ref 0.3–1.2)
Total Protein: 6.2 g/dL — ABNORMAL LOW (ref 6.5–8.1)

## 2019-10-23 LAB — MAGNESIUM: Magnesium: 1.7 mg/dL (ref 1.7–2.4)

## 2019-10-23 MED ORDER — RIVAROXABAN 20 MG PO TABS
20.0000 mg | ORAL_TABLET | Freq: Every day | ORAL | Status: DC
  Administered 2019-10-23 – 2019-10-27 (×5): 20 mg via ORAL
  Filled 2019-10-23 (×5): qty 1

## 2019-10-23 NOTE — Progress Notes (Signed)
Daily Progress Note   Patient Name: William Ali       Date: 10/23/2019 DOB: 1985/08/17  Age: 34 y.o. MRN#: [id number] Attending Physician: Lavina Hamman, MD Primary Care Physician: Elsie Stain, MD Admit Date: 10/12/2019  Reason for Consultation/Follow-up: Pain control  Subjective: I saw and examined William Ali.    He was more alert today and more appropriate in conversation.  He was on phone with radiation oncology team on my entering room.  He reports being agreeable to radiation.  Reviewed his PCA:  Hours  Total Fentanyl in mcg 12  550 8  400 4  225 2  99 1  50  Discussed that based upon this usage, (approx 56mcg/hr over the last 12 hours), will plan to continue same for now rather than increasing basal rate.    I called and discussed plan with his wife as well.  Answered questions regarding plan and possibility of need to consider rotation to methadone.    Length of Stay: 11  Current Medications: Scheduled Meds:  . sodium chloride   Intravenous Once  . Chlorhexidine Gluconate Cloth  6 each Topical Daily  . dexamethasone  2 mg Oral Daily  . dicyclomine  10 mg Oral TID AC  . DULoxetine  60 mg Oral Daily  . feeding supplement (ENSURE ENLIVE)  237 mL Oral BID BM  . fentaNYL   Intravenous O6Z  . folic acid  1 mg Oral Daily  . lidocaine  1 patch Transdermal Q24H  . metoprolol succinate  50 mg Oral Daily  . rivaroxaban  20 mg Oral Q supper  . senna-docusate  1 tablet Oral BID  . sodium chloride flush  10-40 mL Intracatheter Q12H  . sodium chloride flush  3 mL Intravenous Q12H   PRN Meds: acetaminophen **OR** acetaminophen, diphenhydrAMINE **OR** diphenhydrAMINE, fentaNYL (SUBLIMAZE) injection, hydrALAZINE, labetalol, naloxone **AND** sodium chloride flush,  ondansetron, pantoprazole, polyethylene glycol, polyvinyl alcohol, sodium chloride flush  Physical Exam         Resting comfortably Not in distress this morning.  Mental status improved. Regular work of breathing S 1 S 2  Intermittent myoclonic jerks noted but decreased frequency compared to yesterday No edema  Vital Signs: BP (!) 147/115 (BP Location: Left Arm)   Pulse (!) 102   Temp 98 F (36.7  C) (Oral)   Resp (!) 21   Ht 5\' 7"  (1.702 m)   Wt 95.7 kg   SpO2 98%   BMI 33.05 kg/m  SpO2: SpO2: 98 % O2 Device: O2 Device: Room Air O2 Flow Rate:    Intake/output summary:   Intake/Output Summary (Last 24 hours) at 10/23/2019 2333 Last data filed at 10/23/2019 1639 Gross per 24 hour  Intake 401 ml  Output 970 ml  Net -569 ml   LBM: Last BM Date: 10/21/19 Baseline Weight: Weight: 95.3 kg Most recent weight: Weight: 95.7 kg       Palliative Assessment/Data:    Flowsheet Rows     Most Recent Value  Intake Tab  Referral Department Hospitalist  Unit at Time of Referral ER  Palliative Care Primary Diagnosis Cancer  Date Notified 10/12/19  Palliative Care Type Return patient Palliative Care  Reason for referral Pain, Non-pain Symptom  Date of Admission 10/12/19  Date first seen by Palliative Care 10/14/19  # of days Palliative referral response time 2 Day(s)  # of days IP prior to Palliative referral 0  Clinical Assessment  Psychosocial & Spiritual Assessment  Palliative Care Outcomes      Patient Active Problem List   Diagnosis Date Noted  . Chest pain   . Generalized abdominal pain   . Prolonged Q-T interval on ECG 10/14/2019  . Hypomagnesemia 10/14/2019  . Cancer related pain 10/14/2019  . SIRS (systemic inflammatory response syndrome) ([place]) 10/12/2019  . Sepsis ([place]) 10/12/2019  . Tachycardia 10/12/2019  . Colitis 10/12/2019  . Hypokalemia 10/12/2019  . Palliative care encounter   . Metastatic primary lung cancer ([place]) 06/17/2019  . 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 Pulmonary embolism ([place]) 06/13/2019  . DVT (deep venous thrombosis) ([place]) 06/13/2019  . Goals of care, counseling/discussion 05/21/2019  . Metastatic adenocarcinoma to brain ([place]) 05/21/2019  . Adenocarcinoma of lung ([place]) 05/21/2019  . Essential hypertension 08/17/2018  . 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 CT scan with transverse colitis, MRI spine with increased tumor burden, stable metastatic fracture of the spine.   Assessment: Severe pain, uncontrolled at times.  Myoclonic jerks, tremor, hallucinations worse at night.  Back pain at site of T12 to S1 Electrolyte abnormalities.  PPS 40%  Recommendations/Plan: Pain, Cancer related: He continues to have myoclonic jerks and periods of hallucinations/confusion.  However, both of these have improved since yesterday.  Initially I was planning to increase basal rate this AM to get to prior dose 173mcg/hr for patch that was removed.  While he still likely has some fentanyl in his system from patch, his needs have not been as high as I would have anticipated.  Will therefore continue current regimen of fentanyl PCA with 32mcg/hr basal and 69mcg bolus with 15 minute lockout.  Discussed with Dr. Hilma Favors who will f/u tomorrow. - May need to consider methadone as next step if pain remains poorly controlled with increased fentanyl or if myoclonus continues/worsens.  If this is started, will need to closely monitor QTc.  Most recent QTc is 454 on EKG. - Patient also on adjuvants such as Cymbalta, decadron, and  baclofen.  Toradol  stopped due to low platelets and decrease in hemoglobin. -This is difficult case as patient has bony mets.  Often, unfortunately, opioids and of themselves are not as effective for bony pain is other interventions.  Therefore, made addition again of low dose steroids.  Discussed risk vs benefit and needing to work to minimize dose as much as possible while also trying to control pain and maximize functionality. -Agree with reevaluation by radiation oncology.  Goals of Care and Additional Recommendations: Limitations on Scope of Treatment: Full Scope Treatment  Code Status:    Code Status Orders  (From admission, onward)         Start     Ordered   10/12/19 2242  Full code  Continuous        10/12/19 2241        Code Status History    Date Active Date Inactive Code Status Order ID Comments User Context   06/17/2019 1740 06/28/2019 2149 Full Code [id number]  Shelly Coss, MD ED   06/13/2019 1525 06/16/2019 2244 Full Code [id number]  Georgette Shell, MD ED   Advance Care Planning Activity      Prognosis:  Unable to determine  Discharge Planning: To Be Determined  Care plan was discussed with  Patient, wife, Dr. Posey Pronto and bedside RN.   Thank you for allowing the Palliative Medicine Team to assist in the care of this patient.   Total Time 45 Prolonged Time Billed No   Greater than 50%  of this time was spent counseling and coordinating care related to the above assessment and plan.  Micheline Rough, MD  Please contact Palliative Medicine Team phone at [phone] for questions and concerns.

## 2019-10-23 NOTE — Progress Notes (Signed)
[facility] Progress Note  Patient: William Ali    LEX:[id number]  DOA: 10/12/2019     Date of Service: the patient was seen and examined on 10/23/2019  Brief hospital course: Past medical history of DVT on Xarelto, metastatic adenocarcinoma of the lung with mets to brain and bone as well as liver and chronic pain secondary to metastasis. Presents with complaints of worsening pain and diarrhea. Found to have transverse colitis causing severe sepsis as well as worsening of his chronic cancer related pain. Currently plan is continue pain control.  Assessment and Plan: 1. Acute on chronic cancer-related back pain. Myoclonic jerks. Visual and auditory hallucination. Tremors. Continues to have intractable back pain. Palliative care consulted. Appreciate assistance. Currently on fentanyl patch as well as IV fentanyl. Patient was on Toradol but currently on hold secondary to low platelets. Patient was on Decadron which was on hold briefly in early resume. Due to persistent abdominal pain CT abdomen was performed, does not show any evidence of acute abnormality that can explain patient's pain other than his metastatic lesions. Patient was on 100 mics of fentanyl patch along with as needed fentanyl pushes. Multiple discussions were held with patient as well as wife due to concern with medication side effects. Patient currently agreeable to try PCA for dose finding. Based on the current PCA use of 50 mics per hour roughly plan is to continue PCA without any medication. Palliative care is also considering possible transition to methadone. Highly appreciate radiation oncology's assistance as well.  We will monitor recommendation.  2. Severe sepsis, present on admission secondary to acute transverse colitis. With tachycardia tachypnea and lactic acidosis meeting sepsis criteria. Treated with IV fluids. Also treated with IV broad-spectrum antibiotics but currently antibiotics are  discontinued. Patient does not have any further diarrhea. C. difficile antigen positive, antibody negative GI pathogen panel negative patient was started on oral vancomycin given his immunosuppressed status.  3. History of DVT and PE. On Xarelto at home. Currently on hold given his drop in hemoglobin and platelets. Per Dr. Lorenso Courier hematology, okay to resume Xarelto as long as platelets are more than 50. Currently platelets are 87. Currently on Lovenox. We will transition to Xarelto.  4.Adenocarcinoma of the lung with metastasis to bone, liver, brain. Follows up with Dr. Lorenso Courier. Patient will have outpatient follow-up with them.  5.Chemotherapy-induced pancytopenia. Platelets significantly lower along with rest of the blood count. Hematology was consulted. Currently Dr. Lorenso Courier from hematology okay with anticoagulation as long as platelets remain above 30. Lovenox 1 mg/kg ordered based on hematology recommendation. We will transition to Xarelto.  6.Hypomagnesemia Replaced.  7.Obesity Body mass index is 33.05 kg/m. In the setting of cancer patient actually is at risk for malnutrition. Monitor.  8.  Delirium May require telesitter.  9.  LFT elevation. Likely in response to liver involvement with metastasis. Continue to monitor for now.  Diet: Cardiac diet DVT Prophylaxis: Therapeutic Anticoagulation with Lovenox      Advance goals of care discussion: Full code  Family Communication: family was present at bedside, at the time of interview.  The pt provided permission to discuss medical plan with the family. Opportunity was given to ask question and all questions were answered satisfactorily.   Disposition:  Status is: Inpatient  Remains inpatient appropriate because:Ongoing active pain requiring inpatient pain management   Dispo:  Patient From: Home  Planned Disposition: Home with Health Care Svc  Expected discharge date: To be determined  Medically  stable for discharge: No  Subjective: Continues to have pain but mentation has improved significantly as compared to yesterday.  Still has myoclonic jerks still has hallucination.  Mid conversation patient is able to stay awake and carry out a conversation as long as needed.  Physical Exam:  General: Appear in mild distress, no Rash; Oral Mucosa Clear, moist. no Abnormal Neck Mass Or lumps, Conjunctiva normal  Cardiovascular: S1 and S2 Present, no Murmur, Respiratory: good respiratory effort, Bilateral Air entry present and bilateral  Crackles, no wheezes Abdomen: Bowel Sound present, Soft and diffuse tenderness Extremities: no Pedal edema Neurology: alert and oriented to time, place, and person affect appropriate. no new focal deficit Gait not checked due to patient safety concerns   Vitals:   10/23/19 0857 10/23/19 0900 10/23/19 1115 10/23/19 1418  BP:  (!) 146/93  (!) 155/90  Pulse:  98  (!) 105  Resp:   (!) 22 (!) 22  Temp:  97.9 F (36.6 C)  98 F (36.7 C)  TempSrc:  Oral  Oral  SpO2: 97% 97% 93% 98%  Weight:      Height:        Intake/Output Summary (Last 24 hours) at 10/23/2019 1634 Last data filed at 10/23/2019 1500 Gross per 24 hour  Intake 461 ml  Output 1200 ml  Net -739 ml   Filed Weights   10/12/19 1541 10/13/19 1611  Weight: 95.3 kg 95.7 kg    Data Reviewed: I have personally reviewed and interpreted daily labs, tele strips, imagings as discussed above. I reviewed all nursing notes, pharmacy notes, vitals, pertinent old records I have discussed plan of care as described above with RN and patient/family.  CBC: Recent Labs  Lab 10/19/19 0400 10/20/19 0408 10/21/19 0344 10/22/19 0542 10/23/19 0455  WBC 4.2 5.2 5.0 8.1 8.3  NEUTROABS 2.5 3.3 3.3 6.3 6.5  HGB 9.0* 9.1* 9.2* 9.3* 9.0*  HCT 29.6* 29.5* 29.9* 30.2* 29.8*  MCV 93.1 91.6 92.9 91.5 93.4  PLT 45* 40* 40* 59* 87*   Basic Metabolic Panel: Recent Labs  Lab 10/18/19 0426 10/18/19 0426  10/19/19 0400 10/20/19 0408 10/21/19 0344 10/22/19 0542 10/23/19 0455  NA 143   < > 142 139 137 142 141  K 3.2*   < > 3.7 3.5 3.3* 4.3 3.7  CL 111   < > 110 105 104 105 106  CO2 24   < > 23 24 23 25 24   GLUCOSE 95   < > 92 94 72 149* 87  BUN 11   < > 8 7 8 10 9   CREATININE 0.98   < > 0.81 0.87 0.82 0.91 0.91  CALCIUM 7.7*   < > 8.2* 8.3* 8.1* 8.9 8.3*  MG 1.7  --   --   --   --   --  1.7  PHOS 3.3  --   --   --   --   --   --    < > = values in this interval not displayed.    Studies: No results found.  Scheduled Meds: . sodium chloride   Intravenous Once  . Chlorhexidine Gluconate Cloth  6 each Topical Daily  . dexamethasone  2 mg Oral Daily  . dicyclomine  10 mg Oral TID AC  . DULoxetine  60 mg Oral Daily  . enoxaparin (LOVENOX) injection  1 mg/kg Subcutaneous Q12H  . feeding supplement (ENSURE ENLIVE)  237 mL Oral BID BM  . fentaNYL   Intravenous Z6X  . folic acid  1  mg Oral Daily  . lidocaine  1 patch Transdermal Q24H  . metoprolol succinate  50 mg Oral Daily  . senna-docusate  1 tablet Oral BID  . sodium chloride flush  10-40 mL Intracatheter Q12H  . sodium chloride flush  3 mL Intravenous Q12H  . vancomycin  125 mg Oral QID   Continuous Infusions: PRN Meds: acetaminophen **OR** acetaminophen, diphenhydrAMINE **OR** diphenhydrAMINE, fentaNYL (SUBLIMAZE) injection, hydrALAZINE, labetalol, naloxone **AND** sodium chloride flush, ondansetron, pantoprazole, polyethylene glycol, polyvinyl alcohol, sodium chloride flush  Time spent: 35 minutes  Author: Berle Mull, MD [facility] 10/23/2019 4:34 PM  To reach On-call, see care teams to locate the attending and reach out via [email or web address]. Between 7PM-7AM, please contact night-coverage If you still have difficulty reaching the attending provider, please page the [facility] (Director on Call) for [facility] on amion for assistance.

## 2019-10-23 NOTE — Progress Notes (Signed)
ANTICOAGULATION CONSULT NOTE - Initial Consult  Pharmacy Consult for Xarelto  Indication: history of DVT/PE   Allergies  Allergen Reactions  . Onion Anaphylaxis  . Other     All sea food causes swelling and a rash.    . Peanut Oil Itching  . Peanut-Containing Drug Products Itching    Patient Measurements: Height: 5' 7"  (170.2 cm) Weight: 95.7 kg (211 lb) IBW/kg (Calculated) : 66.1  Vital Signs: Temp: 97.6 F (36.4 C) (09/27 1639) Temp Source: Oral (09/27 1639) BP: 159/97 (09/27 1639) Pulse Rate: 102 (09/27 1639)  Labs: Recent Labs    10/21/19 0344 10/21/19 0344 10/22/19 0542 10/23/19 0455  HGB 9.2*   < > 9.3* 9.0*  HCT 29.9*  --  30.2* 29.8*  PLT 40*  --  59* 87*  CREATININE 0.82  --  0.91 0.91   < > = values in this interval not displayed.    Estimated Creatinine Clearance: 124.8 mL/min (by C-G formula based on SCr of 0.91 mg/dL).   Medical History: Past Medical History:  Diagnosis Date  . Back pain   . Hypertension   . met lung ca to liver, spine, and brain dx'd 03/2019    Assessment: 74 y/oM with PMH metastatic adenocarcinoma of the lung with mets to the brain, bone, and liver, and hx of DVT/PE on Xarelto admitted for colitis. Given drop in platelet count, oncology recommended transition to Enoxaparin and to hold if Pltc < 30K. Per Oncology, if platelets stable > 50, can restart Xarelto. Pltc 87K today, so pharmacy consulted to transition anticoagulation back to Xarelto. SCr WNL, 0.91.   Goal of Therapy:  Appropriate dosing based on indication Monitor platelets by anticoagulation protocol: Yes   Plan:  Discontinue Enoxaparin (last dose today at 0903) Resume Xarelto 37m PO daily with supper at 2000 tonight Monitor renal function, CBC, and for s/sx of bleeding  Pharmacy to sign off. Please re-consult if we can be of further assistance.    JLindell Spar PharmD, BCPS Clinical Pharmacist  10/23/2019,4:51 PM

## 2019-10-23 NOTE — Progress Notes (Addendum)
Addendum:  I spoke with the patient this morning, Dr. Lisbeth Renshaw was able to communicate through in basket regarding his recommendations.  The patient is symptomatic most in the lumbar spine at this point, he has diffuse disease which is difficult and has had recent radiotherapy, his areas of treatment include T2-6, T9-11, L5/sacrum through the bottom half of L4 from his treatment that completed approximately 4 months ago.  Dr. Lisbeth Renshaw would consider treatment to L1 through 3 stereotactic radiosurgery depending on additional disease.  After discussing with the patient, his symptoms are truly in the low back, he states that his right lower stomach seems to have some shooting pains at times.  His pain goes from the low back down the front of his leg to the lower leg and ankle.  He also has some pain in the back of his thigh.  Based on dermatomes I think he is most symptomatic in the lumbar spine. His description of symptoms are along L3-L4, with still persistent symptoms in the sacral region.  He denies any neurologic deficits, and denies any pain in the mid or upper back.  He is not have any other neurologic symptoms in the T-spine at this time.  He reports his headaches have improved with dexamethasone, and he is due for routine imaging and brain oncology conference in November.  We discussed the options of additional palliative radiotherapy to T1-3, and the patient is interested in proceeding.  He will simulate tomorrow.  We reviewed the risks, benefits, short and long-term effects of radiotherapy, and with ongoing chemotherapy with Dr. Lorenso Courier, I suspect that this would be a 3-week course to allow for both therapies to be going on simultaneously.  He will sign written consent tomorrow to proceed, and I anticipate that we would begin therapy Wednesday or Thursday of this week.        Our service is aware of the patient. Awaiting Dr. Ida Rogue evaluation of his scans to determine if there is a role for further  radiotherapy. Further discussion to follow tomorrow.     Carola Rhine, PAC

## 2019-10-23 NOTE — Plan of Care (Signed)
  Problem: Clinical Measurements: Goal: Will remain free from infection Outcome: Progressing Goal: Diagnostic test results will improve Outcome: Progressing Goal: Respiratory complications will improve Outcome: Progressing Goal: Cardiovascular complication will be avoided Outcome: Progressing   Problem: Activity: Goal: Risk for activity intolerance will decrease Outcome: Progressing   Problem: Nutrition: Goal: Adequate nutrition will be maintained Outcome: Progressing   Problem: Coping: Goal: Level of anxiety will decrease Outcome: Progressing   Problem: Elimination: Goal: Will not experience complications related to bowel motility Outcome: Progressing Goal: Will not experience complications related to urinary retention Outcome: Progressing   Problem: Pain Managment: Goal: General experience of comfort will improve Outcome: Progressing   Problem: Safety: Goal: Ability to remain free from injury will improve Outcome: Progressing   Problem: Skin Integrity: Goal: Risk for impaired skin integrity will decrease Outcome: Progressing   Problem: Clinical Measurements: Goal: Will remain free from infection Outcome: Progressing Goal: Diagnostic test results will improve Outcome: Progressing Goal: Respiratory complications will improve Outcome: Progressing Goal: Cardiovascular complication will be avoided Outcome: Progressing   Problem: Activity: Goal: Risk for activity intolerance will decrease Outcome: Progressing   Problem: Nutrition: Goal: Adequate nutrition will be maintained Outcome: Progressing   Problem: Coping: Goal: Level of anxiety will decrease Outcome: Progressing   Problem: Elimination: Goal: Will not experience complications related to bowel motility Outcome: Progressing Goal: Will not experience complications related to urinary retention Outcome: Progressing   Problem: Pain Managment: Goal: General experience of comfort will improve Outcome:  Progressing   Problem: Safety: Goal: Ability to remain free from injury will improve Outcome: Progressing   Problem: Skin Integrity: Goal: Risk for impaired skin integrity will decrease Outcome: Progressing

## 2019-10-23 NOTE — Progress Notes (Signed)
Patient is having auditory hallucinations and confusion that has become more frequent this afternoon. Pt has pulled off telemetry wires and canula for the PCA several times. Patient continues to be reoriented easily, bed alarm turned back on.   Wynona Neat, RN

## 2019-10-24 ENCOUNTER — Ambulatory Visit
Admit: 2019-10-24 | Discharge: 2019-10-24 | Disposition: A | Discharge status: Home / Self Care | Payer: PRIVATE HEALTH INSURANCE | Attending: Radiation Oncology | Admitting: Radiation Oncology

## 2019-10-24 LAB — CBC WITH DIFFERENTIAL/PLATELET
Abs Immature Granulocytes: 0.14 10*3/uL — ABNORMAL HIGH (ref 0.00–0.07)
Basophils Absolute: 0 10*3/uL (ref 0.0–0.1)
Basophils Relative: 0 %
Eosinophils Absolute: 0 10*3/uL (ref 0.0–0.5)
Eosinophils Relative: 0 %
HCT: 28.9 % — ABNORMAL LOW (ref 39.0–52.0)
Hemoglobin: 8.6 g/dL — ABNORMAL LOW (ref 13.0–17.0)
Immature Granulocytes: 2 %
Lymphocytes Relative: 3 %
Lymphs Abs: 0.2 10*3/uL — ABNORMAL LOW (ref 0.7–4.0)
MCH: 28.3 pg (ref 26.0–34.0)
MCHC: 29.8 g/dL — ABNORMAL LOW (ref 30.0–36.0)
MCV: 95.1 fL (ref 80.0–100.0)
Monocytes Absolute: 1.3 10*3/uL — ABNORMAL HIGH (ref 0.1–1.0)
Monocytes Relative: 19 %
Neutro Abs: 5.4 10*3/uL (ref 1.7–7.7)
Neutrophils Relative %: 76 %
Platelets: 112 10*3/uL — ABNORMAL LOW (ref 150–400)
RBC: 3.04 MIL/uL — ABNORMAL LOW (ref 4.22–5.81)
RDW: 20.3 % — ABNORMAL HIGH (ref 11.5–15.5)
WBC: 7.1 10*3/uL (ref 4.0–10.5)
nRBC: 2 % — ABNORMAL HIGH (ref 0.0–0.2)

## 2019-10-24 LAB — COMPREHENSIVE METABOLIC PANEL
ALT: 103 U/L — ABNORMAL HIGH (ref 0–44)
AST: 118 U/L — ABNORMAL HIGH (ref 15–41)
Albumin: 2.8 g/dL — ABNORMAL LOW (ref 3.5–5.0)
Alkaline Phosphatase: 345 U/L — ABNORMAL HIGH (ref 38–126)
Anion gap: 10 (ref 5–15)
BUN: 12 mg/dL (ref 6–20)
CO2: 25 mmol/L (ref 22–32)
Calcium: 8.3 mg/dL — ABNORMAL LOW (ref 8.9–10.3)
Chloride: 110 mmol/L (ref 98–111)
Creatinine, Ser: 0.89 mg/dL (ref 0.61–1.24)
GFR calc Af Amer: >60 mL/min (ref >60)
GFR calc non Af Amer: >60 mL/min (ref >60)
Glucose, Bld: 107 mg/dL — ABNORMAL HIGH (ref 70–99)
Potassium: 3.5 mmol/L (ref 3.5–5.1)
Sodium: 145 mmol/L (ref 135–145)
Total Bilirubin: 0.5 mg/dL (ref 0.3–1.2)
Total Protein: 6.1 g/dL — ABNORMAL LOW (ref 6.5–8.1)

## 2019-10-24 LAB — MAGNESIUM: Magnesium: 1.8 mg/dL (ref 1.7–2.4)

## 2019-10-24 MED ORDER — LACTULOSE 10 GM/15ML PO SOLN
20.0000 g | Freq: Every day | ORAL | Status: DC
  Administered 2019-10-25 – 2019-10-28 (×4): 20 g via ORAL
  Filled 2019-10-24 (×4): qty 30

## 2019-10-24 NOTE — Progress Notes (Signed)
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 Presents with complaints of worsening pain and diarrhea. Found to have transverse colitis causing severe sepsis as well as worsening of his chronic cancer related pain. Palliative care was consulted. Patient was started on fentanyl patch but started having myoclonic jerk as well as severe hallucination. Decision was made to transition patient to fentanyl PCA and monitor dose requirement. Along with that the patient is also be undergoing radiation therapy for palliation.  Currently plan is continue pain control.  Assessment and Plan: 1. Acute on chronic cancer-related back pain. Myoclonic jerks. Visual and auditory hallucination. Tremors. Continues to have intractable back pain. Palliative care consulted. Appreciate assistance. Currently on fentanyl patch as well as IV fentanyl. Patient was on Toradol but currently on hold secondary to low platelets. Patient was on Decadron which was on hold briefly in early resume. Due to persistent abdominal pain CT abdomen was performed, does not show any evidence of acute abnormality that can explain patient's pain other than his metastatic lesions. Patient was on 100 mics of fentanyl patch along with as needed fentanyl pushes. Multiple discussions were held with patient as well as wife due to concern with medication side effects. Patient currently agreeable to try PCA for dose finding. Management per palliative care team. Improvement in myoclonic jerks as well as hallucination since placement on PCA although still remains symptomatic with the same with less frequency. Highly appreciate radiation  oncology's assistance as well.  We will monitor recommendation.  2. Severe sepsis, present on admission secondary to acute transverse colitis. With tachycardia tachypnea and lactic acidosis meeting sepsis criteria. Treated with IV fluids. Also treated with IV broad-spectrum antibiotics but currently antibiotics are discontinued. Patient does not have any further diarrhea. C. difficile antigen positive, antibody negative GI pathogen panel negative patient was started on oral vancomycin given his immunosuppressed status.  3. History of DVT and PE. On Xarelto at home. Currently on hold given his drop in hemoglobin and platelets. Per Dr. Lorenso Courier hematology, okay to resume Xarelto as long as platelets are more than 50. Currently platelets are improving Briefly on Lovenox. Now on Xarelto.  4.Adenocarcinoma of the lung with metastasis to bone, liver, brain. Follows up with Dr. Lorenso Courier. Patient will have outpatient follow-up with them.  5.Chemotherapy-induced pancytopenia. Platelets significantly lower along with rest of the blood count. Hematology was consulted. Currently Dr. Lorenso Courier from hematology okay with anticoagulation as long as platelets remain above 30. Lovenox 1 mg/kg ordered based on hematology recommendation. Now on Xarelto.  6.Hypomagnesemia Replaced.  7.Obesity Body mass index is 33.05 kg/m. In the setting of cancer patient actually is at risk for malnutrition. Monitor.  8.  Delirium May require telesitter.  Currently stable.  Likely in the setting of polypharmacy.  9.  LFT elevation. Likely in response to liver involvement with metastasis.  Worsening every day. Continue to monitor for now. May require lactulose.  Diet: Cardiac diet DVT Prophylaxis: Xarelto     Advance goals of care discussion: Full code  Family Communication: Wife was present at bedside, at the time of interview.  The pt provided permission to discuss medical plan with the  family. Opportunity was given to  ask question and all questions were answered satisfactorily.   Disposition:  Status is: Inpatient  Remains inpatient appropriate because:Ongoing active pain requiring inpatient pain management   Dispo:  Patient From: Home  Planned Disposition: Home  Expected discharge date: To be determined  Medically stable for discharge: No   Subjective: No nausea no vomiting.  No fever no chills.  No chest pain.  Pain is well controlled.  Physical Exam:  General: Appear in mild distress, no Rash; Oral Mucosa Clear, moist. no Abnormal Neck Mass Or lumps, Conjunctiva normal  Cardiovascular: S1 and S2 Present, no Murmur, Respiratory: good respiratory effort, Bilateral Air entry present and bilateral  Crackles, no wheezes Abdomen: Bowel Sound present, Soft and diffuse tenderness Extremities: no Pedal edema Neurology: alert and oriented to time, place, and person affect appropriate. no new focal deficit Gait not checked due to patient safety concerns   Vitals:   10/24/19 1415 10/24/19 1521 10/24/19 2006 10/24/19 2022  BP: (!) 149/97   (!) 144/87  Pulse: 96   99  Resp: (!) 25 (!) 23 (!) 25 (!) 25  Temp: 98.6 F (37 C)   97.8 F (36.6 C)  TempSrc: Oral   Oral  SpO2: 98% 98% 100% 98%  Weight:      Height:        Intake/Output Summary (Last 24 hours) at 10/24/2019 2037 Last data filed at 10/24/2019 1702 Gross per 24 hour  Intake 140 ml  Output 750 ml  Net -610 ml   Filed Weights   10/12/19 1541 10/13/19 1611  Weight: 95.3 kg 95.7 kg    Data Reviewed: I have personally reviewed and interpreted daily labs, tele strips, imagings as discussed above. I reviewed all nursing notes, pharmacy notes, vitals, pertinent old records I have discussed plan of care as described above with RN and patient/family.  CBC: Recent Labs  Lab 10/20/19 0408 10/21/19 0344 10/22/19 0542 10/23/19 0455 10/24/19 0320  WBC 5.2 5.0 8.1 8.3 7.1  NEUTROABS 3.3 3.3 6.3 6.5 5.4   HGB 9.1* 9.2* 9.3* 9.0* 8.6*  HCT 29.5* 29.9* 30.2* 29.8* 28.9*  MCV 91.6 92.9 91.5 93.4 95.1  PLT 40* 40* 59* 87* 191*   Basic Metabolic Panel: Recent Labs  Lab 10/18/19 0426 10/19/19 0400 10/20/19 0408 10/21/19 0344 10/22/19 0542 10/23/19 0455 10/24/19 0320  NA 143   < > 139 137 142 141 145  K 3.2*   < > 3.5 3.3* 4.3 3.7 3.5  CL 111   < > 105 104 105 106 110  CO2 24   < > 24 23 25 24 25   GLUCOSE 95   < > 94 72 149* 87 107*  BUN 11   < > 7 8 10 9 12   CREATININE 0.98   < > 0.87 0.82 0.91 0.91 0.89  CALCIUM 7.7*   < > 8.3* 8.1* 8.9 8.3* 8.3*  MG 1.7  --   --   --   --  1.7 1.8  PHOS 3.3  --   --   --   --   --   --    < > = values in this interval not displayed.    Studies: No results found.  Scheduled Meds: . sodium chloride   Intravenous Once  . Chlorhexidine Gluconate Cloth  6 each Topical Daily  . dexamethasone  2 mg Oral Daily  . dicyclomine  10 mg Oral TID AC  . DULoxetine  60 mg Oral Daily  . feeding supplement (ENSURE  ENLIVE)  237 mL Oral BID BM  . fentaNYL   Intravenous S4B  . folic acid  1 mg Oral Daily  . lidocaine  1 patch Transdermal Q24H  . metoprolol succinate  50 mg Oral Daily  . rivaroxaban  20 mg Oral Q supper  . senna-docusate  1 tablet Oral BID  . sodium chloride flush  10-40 mL Intracatheter Q12H  . sodium chloride flush  3 mL Intravenous Q12H   Continuous Infusions: PRN Meds: acetaminophen **OR** acetaminophen, diphenhydrAMINE **OR** diphenhydrAMINE, fentaNYL (SUBLIMAZE) injection, hydrALAZINE, labetalol, naloxone **AND** sodium chloride flush, ondansetron, pantoprazole, polyethylene glycol, polyvinyl alcohol, sodium chloride flush  Time spent: 35 minutes  Author: Berle Mull, MD [facility] 10/24/2019 8:37 PM  To reach On-call, see care teams to locate the attending and reach out via [email or web address]. Between 7PM-7AM, please contact night-coverage If you still have difficulty reaching the attending provider, please page the [facility]  (Director on Call) for [facility] on amion for assistance.

## 2019-10-25 ENCOUNTER — Ambulatory Visit
Admit: 2019-10-25 | Discharge: 2019-10-25 | Disposition: A | Discharge status: Home / Self Care | Payer: PRIVATE HEALTH INSURANCE | Attending: Radiation Oncology | Admitting: Radiation Oncology

## 2019-10-25 LAB — COMPREHENSIVE METABOLIC PANEL
ALT: 101 U/L — ABNORMAL HIGH (ref 0–44)
AST: 116 U/L — ABNORMAL HIGH (ref 15–41)
Albumin: 2.8 g/dL — ABNORMAL LOW (ref 3.5–5.0)
Alkaline Phosphatase: 366 U/L — ABNORMAL HIGH (ref 38–126)
Anion gap: 10 (ref 5–15)
BUN: 12 mg/dL (ref 6–20)
CO2: 26 mmol/L (ref 22–32)
Calcium: 8.4 mg/dL — ABNORMAL LOW (ref 8.9–10.3)
Chloride: 109 mmol/L (ref 98–111)
Creatinine, Ser: 0.86 mg/dL (ref 0.61–1.24)
GFR calc Af Amer: >60 mL/min (ref >60)
GFR calc non Af Amer: >60 mL/min (ref >60)
Glucose, Bld: 99 mg/dL (ref 70–99)
Potassium: 3.4 mmol/L — ABNORMAL LOW (ref 3.5–5.1)
Sodium: 145 mmol/L (ref 135–145)
Total Bilirubin: 0.5 mg/dL (ref 0.3–1.2)
Total Protein: 6.1 g/dL — ABNORMAL LOW (ref 6.5–8.1)

## 2019-10-25 LAB — CBC WITH DIFFERENTIAL/PLATELET
Abs Immature Granulocytes: 0.11 10*3/uL — ABNORMAL HIGH (ref 0.00–0.07)
Basophils Absolute: 0 10*3/uL (ref 0.0–0.1)
Basophils Relative: 0 %
Eosinophils Absolute: 0 10*3/uL (ref 0.0–0.5)
Eosinophils Relative: 0 %
HCT: 29 % — ABNORMAL LOW (ref 39.0–52.0)
Hemoglobin: 8.8 g/dL — ABNORMAL LOW (ref 13.0–17.0)
Immature Granulocytes: 1 %
Lymphocytes Relative: 5 %
Lymphs Abs: 0.4 10*3/uL — ABNORMAL LOW (ref 0.7–4.0)
MCH: 28.3 pg (ref 26.0–34.0)
MCHC: 30.3 g/dL (ref 30.0–36.0)
MCV: 93.2 fL (ref 80.0–100.0)
Monocytes Absolute: 1.4 10*3/uL — ABNORMAL HIGH (ref 0.1–1.0)
Monocytes Relative: 17 %
Neutro Abs: 6 10*3/uL (ref 1.7–7.7)
Neutrophils Relative %: 77 %
Platelets: 122 10*3/uL — ABNORMAL LOW (ref 150–400)
RBC: 3.11 MIL/uL — ABNORMAL LOW (ref 4.22–5.81)
RDW: 20.6 % — ABNORMAL HIGH (ref 11.5–15.5)
WBC: 7.9 10*3/uL (ref 4.0–10.5)
nRBC: 1.4 % — ABNORMAL HIGH (ref 0.0–0.2)

## 2019-10-25 LAB — MAGNESIUM: Magnesium: 1.8 mg/dL (ref 1.7–2.4)

## 2019-10-25 MED ORDER — FENTANYL 50 MCG/ML IV PCA SOLN
INTRAVENOUS | Status: DC
  Administered 2019-10-26: 0 ug via INTRAVENOUS
  Filled 2019-10-25: qty 20

## 2019-10-25 MED ORDER — FENTANYL 50 MCG/HR TD PT72
1.0000 | MEDICATED_PATCH | TRANSDERMAL | Status: DC
  Administered 2019-10-25: 1 via TRANSDERMAL
  Filled 2019-10-25: qty 1

## 2019-10-25 MED ORDER — FENTANYL CITRATE (PF) 100 MCG/2ML IJ SOLN: 25.0000 ug | INTRAMUSCULAR | Status: DC | PRN

## 2019-10-25 MED ORDER — POTASSIUM CHLORIDE CRYS ER 20 MEQ PO TBCR: 40.0000 meq | EXTENDED_RELEASE_TABLET | Freq: Once | ORAL | Status: DC

## 2019-10-25 NOTE — Progress Notes (Signed)
PROGRESS NOTE  William Ali  QMV:[id number] DOB: [birth year] DOA: 10/12/2019 PCP: Elsie Stain, MD  Outpatient Specialists: Oncology, Dr. 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 Presents with complaints of worsening pain and diarrhea. Found to have transverse colitis causing severe sepsis as well as worsening of his chronic cancer related pain. Palliative care was consulted. Patient was started on fentanyl patch but started having myoclonic jerk as well as severe hallucination. Decision was made to transition patient to fentanyl PCA and monitor dose requirement. Along with that the patient is also be undergoing radiation therapy for palliation.  Currently plan is continue pain control.  Assessment & Plan: Principal Problem:   Sepsis ([place]) Active Problems:   Goals of care, counseling/discussion   Metastatic adenocarcinoma to brain [facility])   Adenocarcinoma of lung ([place])   Essential hypertension   Tachycardia   Colitis   Hypokalemia   Prolonged Q-T interval on ECG   Hypomagnesemia   Cancer related pain   Chest pain   Generalized abdominal pain  Acute on chronic cancer-related back pain. Myoclonic jerks. Visual and auditory hallucination. Tremors. Continues to have intractable back pain. Greatly appreciate palliative care assistance with management. Fentanyl seems to be best tolerated opioid. Continuing steroids, anti-inflammatories as well for bone metastasis-related pain.  - Continue PCA, convert to non-PCA 9/30.  - Appreciate radiation oncology's assistance as well.  We will monitor recommendation.  Severe sepsis, present on admission secondary to acute transverse colitis. With tachycardia tachypnea and lactic acidosis meeting sepsis criteria. Treated with IV fluids. Also treated with IV broad-spectrum antibiotics and  vancomycin 125mg  po QID x10 days due to +toxigenic CDiff PCR. Currently antibiotics are discontinued.  History of DVT and PE. - Restarted xarelto  Adenocarcinoma of the lung with metastasis to bone, liver, brain. - Follow up with oncology, Dr. Lorenso Courier. - Follow up with Rad Onc  Chemotherapy-induced pancytopenia. Platelets significantly lower along with rest of the blood count. Currently Dr. Lorenso Courier from hematology okay with anticoagulation as long as platelets remain above 30. - Continue xarelto for hx DVT/PE, malignancy  Hypomagnesemia - Replaced.  Obesity: Body mass index is 33.05 kg/m.   Delirium - Minimize polypharmacy.  LFT elevation. Likely in response to liver involvement with metastasis. Mentation ok.  - Monitoring  Telemetry personally reviewed on 9/29, only sinus tachycardia, usually reportedly coincident with pain. Ok to DC telemetry to facilitate mobility.   DVT prophylaxis: Xarelto Code Status: Full Family Communication: None at bedside Disposition Plan:  Status is: Inpatient  Remains inpatient appropriate because:Ongoing active pain requiring inpatient pain management   Dispo:  Patient From: Home  Planned Disposition: Home with Health Care Svc  Expected discharge date: 10/30/19  Medically stable for discharge: No  Consultants:   Oncology  Rad Onc  Palliative care  Procedures:   None  Antimicrobials:  Vancomycin po x 10 days   Subjective: Pain better controlled, moderate-severe when moving though. Using demand PCA rarely. Still having some hallucinations, visual.   Objective: Vitals:   10/25/19 0806 10/25/19 0913 10/25/19 1211 10/25/19 1218  BP: (!) 164/102  (!) 150/107   Pulse: 90  97   Resp: (!) 23 (!) 21 (!) 23 (!) 23  Temp: 98.9 F (37.2 C)  98.7 F (37.1 C)   TempSrc:   Oral   SpO2: 100% 97% 98% 99%  Weight:  Height:        Intake/Output Summary (Last 24 hours) at 10/25/2019 1659 Last data filed at 10/25/2019  1557 Gross per 24 hour  Intake 120 ml  Output 1077 ml  Net -957 ml   Filed Weights   10/12/19 1541 10/13/19 1611  Weight: 95.3 kg 95.7 kg    Gen: 34 y.o. male in no distress  Pulm: Non-labored breathing room air. Clear to auscultation bilaterally.  CV: Regular rate and rhythm. No murmur, rub, or gallop. No JVD, no pedal edema. GI: Abdomen soft, non-tender, non-distended, with normoactive bowel sounds. No organomegaly or masses felt. Ext: Warm, no deformities Skin: No rashes, lesions or ulcers Neuro: Alert and oriented. No focal neurological deficits. Psych: Judgement and insight appear normal. Mood & affect appropriate.   Data Reviewed: I have personally reviewed following labs and imaging studies  CBC: Recent Labs  Lab 10/21/19 0344 10/22/19 0542 10/23/19 0455 10/24/19 0320 10/25/19 0530  WBC 5.0 8.1 8.3 7.1 7.9  NEUTROABS 3.3 6.3 6.5 5.4 6.0  HGB 9.2* 9.3* 9.0* 8.6* 8.8*  HCT 29.9* 30.2* 29.8* 28.9* 29.0*  MCV 92.9 91.5 93.4 95.1 93.2  PLT 40* 59* 87* 112* 627*   Basic Metabolic Panel: Recent Labs  Lab 10/21/19 0344 10/22/19 0542 10/23/19 0455 10/24/19 0320 10/25/19 0530  NA 137 142 141 145 145  K 3.3* 4.3 3.7 3.5 3.4*  CL 104 105 106 110 109  CO2 23 25 24 25 26   GLUCOSE 72 149* 87 107* 99  BUN 8 10 9 12 12   CREATININE 0.82 0.91 0.91 0.89 0.86  CALCIUM 8.1* 8.9 8.3* 8.3* 8.4*  MG  --   --  1.7 1.8 1.8   GFR: Estimated Creatinine Clearance: 132.1 mL/min (by C-G formula based on SCr of 0.86 mg/dL). Liver Function Tests: Recent Labs  Lab 10/22/19 1236 10/23/19 0455 10/24/19 0320 10/25/19 0530  AST 119* 121* 118* 116*  ALT 92* 96* 103* 101*  ALKPHOS 380* 364* 345* 366*  BILITOT 0.6 0.8 0.5 0.5  PROT 6.3* 6.2* 6.1* 6.1*  ALBUMIN 2.6* 2.8* 2.8* 2.8*   No results for input(s): LIPASE, AMYLASE in the last 168 hours. Recent Labs  Lab 10/22/19 1236  AMMONIA 23   Coagulation Profile: No results for input(s): INR, PROTIME in the last 168  hours. Cardiac Enzymes: No results for input(s): CKTOTAL, CKMB, CKMBINDEX, TROPONINI in the last 168 hours. BNP (last 3 results) No results for input(s): PROBNP in the last 8760 hours. HbA1C: No results for input(s): HGBA1C in the last 72 hours. CBG: No results for input(s): GLUCAP in the last 168 hours. Lipid Profile: No results for input(s): CHOL, HDL, LDLCALC, TRIG, CHOLHDL, LDLDIRECT in the last 72 hours. Thyroid Function Tests: No results for input(s): TSH, T4TOTAL, FREET4, T3FREE, THYROIDAB in the last 72 hours. Anemia Panel: No results for input(s): VITAMINB12, FOLATE, FERRITIN, TIBC, IRON, RETICCTPCT in the last 72 hours. Urine analysis:    Component Value Date/Time   COLORURINE YELLOW 06/02/2019 1838   APPEARANCEUR CLEAR 06/02/2019 1838   LABSPEC 1.016 06/02/2019 1838   PHURINE 5.0 06/02/2019 1838   GLUCOSEU NEGATIVE 06/02/2019 1838   HGBUR LARGE (A) 06/02/2019 1838   BILIRUBINUR NEGATIVE 06/02/2019 1838   KETONESUR NEGATIVE 06/02/2019 1838   PROTEINUR NEGATIVE 06/02/2019 1838   UROBILINOGEN 1.0 05/02/2014 1253   NITRITE NEGATIVE 06/02/2019 1838   LEUKOCYTESUR NEGATIVE 06/02/2019 1838   No results found for this or any previous visit (from the past 240 hour(s)).    Radiology Studies:  No results found.  Scheduled Meds:  sodium chloride   Intravenous Once   Chlorhexidine Gluconate Cloth  6 each Topical Daily   dexamethasone  2 mg Oral Daily   dicyclomine  10 mg Oral TID AC   DULoxetine  60 mg Oral Daily   feeding supplement (ENSURE ENLIVE)  237 mL Oral BID BM   fentaNYL   Intravenous W8G   folic acid  1 mg Oral Daily   lactulose  20 g Oral Daily   lidocaine  1 patch Transdermal Q24H   metoprolol succinate  50 mg Oral Daily   rivaroxaban  20 mg Oral Q supper   sodium chloride flush  10-40 mL Intracatheter Q12H   sodium chloride flush  3 mL Intravenous Q12H   Continuous Infusions:   LOS: 13 days   Time spent: 25 minutes.  Patrecia Pour,  MD [facility] [email or web address] 10/25/2019, 4:59 PM

## 2019-10-25 NOTE — TOC Progression Note (Signed)
Transition of Care [facility]) - Progression Note    Patient Details  Name: William Ali MRN: [id number] Date of Birth: 16-Jan-1986  Transition of Care [facility]) CM/SW Contact  Purcell Mouton, RN Phone Number: 10/25/2019, 12:00 PM  Clinical Narrative:    Pt continues to have a lot of pain. Not ready for discharge.    Expected Discharge Plan: Home/Self Care Barriers to Discharge: No Barriers Identified  Expected Discharge Plan and Services Expected Discharge Plan: Home/Self Care       Living arrangements for the past 2 months: Single Family Home                                       Social Determinants of Health (SDOH) Interventions    Readmission Risk Interventions Readmission Risk Prevention Plan 06/19/2019 06/14/2019  Transportation Screening Complete Complete  Medication Review [employer] photographer) Complete Complete  PCP or Specialist appointment within 3-5 days of discharge Complete Complete  HRI or [place] Complete Complete  SW Recovery Care/Counseling Consult Complete Complete  Palliative Care Screening Not Applicable Not [place] Not Applicable Not Applicable  Some recent data might be hidden

## 2019-10-25 NOTE — Progress Notes (Signed)
Changed fentanyl PCA pump syringe. Wasted 90ml fentanyl in stericycle witnessed by [employer], Therapist, sports.

## 2019-10-25 NOTE — Progress Notes (Signed)
Palliative Care Progress Note  William Ali continues to be on a Fentanyl PCA for pain control. He has only used one additional dose in the last 24 hours. His pump shows he has recieved "31mcg" in the last 24 hours- will need to verify pump settings w/RN since this is considerably less than what his basal rate is at 25mcg/hr. He is having mental status changes last PM with some visual and auditory hallucinations. No myoclonus-this is much improved. He is able to communicate with me but is extremely fatigued and weak. Per nursing he sometimes forgets he has the PCA and his short term memory is poor. William Ali tells me today "Im just hanging on", "Im ok".  William Ali is clearly guarding any movement for fear of having worsening pain- his immobility is concerning. Plan is to start radiation on his spinal mets which are causing him the most discomfort.   Recommendations:  1. Will transition his PCA to a Duragesic patch tomorrow once I verify his correct dosing- he isn't needing much additional medication for breakthrough pain but this may change when he mobilizes.  2. He is hopeful radiation will help with his pain. His pain is limiting ability to proceed with a treatment plan and has been extremely difficult to manage given the extensive metastatic disease involving his spine.  3. In terms of next steps he tells me he needs to get with his wife and Dr. Lorenso Courier to "get a game plan together".   4. Maintain steroids-his pain is steroid and anti-inflammatory dependent at this point. Would also like to consider Zometa- if he needs dental imaging we should get that while he is here.  5. We will continue follow William Ali and assist with his symptom management- at this time Fentanyl is the only opioid that he has been able to tolerate without side effects- Breakthrough dosing will be challenging, he wants to avoid home PCA. They are hesitant about methadone but since his QT is corrected it may be our best option. There could  also be a role for ketamine beyond where we stand currently.  [name], DO Palliative Medicine  Time: 35 min Greater than 50%  of this time was spent counseling and coordinating care related to the above assessment and plan.

## 2019-10-25 NOTE — Progress Notes (Signed)
Chaplain responded to referral from charge nurse.   Met Mr. William Ali and his wife.  Established a relationship of care and concern in the short duration of the visit.    Chaplain available as needed for follow-up.  [place]

## 2019-10-25 NOTE — Progress Notes (Signed)
Palliative Care Progress Note  William Ali is stable to slightly improved today which is encouraging - he is very weak but his mental status is much clearer and he tells me he has been able to move around more. No myoclonus today. He has started radiation, tolerating well.  Now two very consistent days on PCA. 24 hour totals 1250- only two bolus doses in 24 hours. Will transition him off the PCA to Transdermal - challenge will be selection of breakthrough medication given what appears to have been opioid induced neurotoxicity and the side effects he experiences from opioids in general.   1. Place 17mcg/hr patch 2. 6 hours after patch is placed reduce PCA basal rate to 80mcg/hr 3. 6 hours later discontinue basal rate 4. Maintain bolus dose on PCA q15-20 min for 24 hours after the patch is placed to ensure comfortable transition.  Continue all other pain control adjuvant medications including Decadron and Cymbalta- will need to provide education at discharge on not abruptly stopping any of these medications which can precipitate withdrawal syndromes and set him back in terms of his treatment plan.  William Hacker, DO Palliative Medicine  Time: 35 min Greater than 50%  of this time was spent counseling and coordinating care related to the above assessment and plan.

## 2019-10-26 ENCOUNTER — Ambulatory Visit
Admit: 2019-10-26 | Discharge: 2019-10-26 | Disposition: A | Discharge status: Home / Self Care | Payer: PRIVATE HEALTH INSURANCE | Attending: Radiation Oncology | Admitting: Radiation Oncology

## 2019-10-26 LAB — COMPREHENSIVE METABOLIC PANEL
ALT: 96 U/L — ABNORMAL HIGH (ref 0–44)
AST: 91 U/L — ABNORMAL HIGH (ref 15–41)
Albumin: 2.8 g/dL — ABNORMAL LOW (ref 3.5–5.0)
Alkaline Phosphatase: 344 U/L — ABNORMAL HIGH (ref 38–126)
Anion gap: 10 (ref 5–15)
BUN: 12 mg/dL (ref 6–20)
CO2: 25 mmol/L (ref 22–32)
Calcium: 8.3 mg/dL — ABNORMAL LOW (ref 8.9–10.3)
Chloride: 109 mmol/L (ref 98–111)
Creatinine, Ser: 0.82 mg/dL (ref 0.61–1.24)
GFR calc Af Amer: >60 mL/min (ref >60)
GFR calc non Af Amer: >60 mL/min (ref >60)
Glucose, Bld: 122 mg/dL — ABNORMAL HIGH (ref 70–99)
Potassium: 3.3 mmol/L — ABNORMAL LOW (ref 3.5–5.1)
Sodium: 144 mmol/L (ref 135–145)
Total Bilirubin: 0.5 mg/dL (ref 0.3–1.2)
Total Protein: 5.9 g/dL — ABNORMAL LOW (ref 6.5–8.1)

## 2019-10-26 LAB — MAGNESIUM: Magnesium: 1.8 mg/dL (ref 1.7–2.4)

## 2019-10-26 LAB — CBC WITH DIFFERENTIAL/PLATELET
Abs Immature Granulocytes: 0.08 10*3/uL — ABNORMAL HIGH (ref 0.00–0.07)
Basophils Absolute: 0 10*3/uL (ref 0.0–0.1)
Basophils Relative: 0 %
Eosinophils Absolute: 0 10*3/uL (ref 0.0–0.5)
Eosinophils Relative: 0 %
HCT: 28 % — ABNORMAL LOW (ref 39.0–52.0)
Hemoglobin: 8.4 g/dL — ABNORMAL LOW (ref 13.0–17.0)
Immature Granulocytes: 1 %
Lymphocytes Relative: 4 %
Lymphs Abs: 0.3 10*3/uL — ABNORMAL LOW (ref 0.7–4.0)
MCH: 28.1 pg (ref 26.0–34.0)
MCHC: 30 g/dL (ref 30.0–36.0)
MCV: 93.6 fL (ref 80.0–100.0)
Monocytes Absolute: 1.3 10*3/uL — ABNORMAL HIGH (ref 0.1–1.0)
Monocytes Relative: 16 %
Neutro Abs: 6 10*3/uL (ref 1.7–7.7)
Neutrophils Relative %: 79 %
Platelets: 145 10*3/uL — ABNORMAL LOW (ref 150–400)
RBC: 2.99 MIL/uL — ABNORMAL LOW (ref 4.22–5.81)
RDW: 20.9 % — ABNORMAL HIGH (ref 11.5–15.5)
WBC: 7.6 10*3/uL (ref 4.0–10.5)
nRBC: 1.2 % — ABNORMAL HIGH (ref 0.0–0.2)

## 2019-10-26 MED ORDER — POTASSIUM CHLORIDE CRYS ER 20 MEQ PO TBCR
40.0000 meq | EXTENDED_RELEASE_TABLET | Freq: Once | ORAL | Status: AC
  Administered 2019-10-26: 40 meq via ORAL
  Filled 2019-10-26: qty 2

## 2019-10-26 MED ORDER — KETOROLAC TROMETHAMINE 15 MG/ML IJ SOLN
15.0000 mg | Freq: Four times a day (QID) | INTRAMUSCULAR | Status: DC | PRN
  Administered 2019-10-26 – 2019-10-28 (×5): 15 mg via INTRAVENOUS
  Filled 2019-10-26 (×5): qty 1

## 2019-10-26 MED ORDER — ALPRAZOLAM 1 MG PO TABS
1.0000 mg | ORAL_TABLET | Freq: Two times a day (BID) | ORAL | Status: DC | PRN
  Administered 2019-10-26: 1 mg via ORAL
  Filled 2019-10-26: qty 1

## 2019-10-26 MED ORDER — KETOROLAC TROMETHAMINE 15 MG/ML IJ SOLN
15.0000 mg | Freq: Once | INTRAMUSCULAR | Status: AC
  Administered 2019-10-26: 15 mg via INTRAVENOUS
  Filled 2019-10-26: qty 1

## 2019-10-26 NOTE — Progress Notes (Signed)
Received call from Dr. Hilma Favors - gave order to discontinue Fentanyl PCA and gave order for Toradol 15 mg PRN - see orders.  Dr. Hilma Favors to see patient this afternoon.

## 2019-10-26 NOTE — Progress Notes (Signed)
Writer questioned patient not pushing PCA button, but continues to complain about pain. Pt stated he doesn't want to take fentanyl because it made him angry and confused, throwing chairs and yell at his mom.  Writer paged on cal for different medication

## 2019-10-26 NOTE — Plan of Care (Signed)
  Problem: Clinical Measurements: Goal: Will remain free from infection Outcome: Progressing Goal: Diagnostic test results will improve Outcome: Progressing Goal: Respiratory complications will improve Outcome: Progressing Goal: Cardiovascular complication will be avoided Outcome: Progressing   Problem: Activity: Goal: Risk for activity intolerance will decrease Outcome: Progressing   Problem: Coping: Goal: Level of anxiety will decrease Outcome: Progressing   Problem: Elimination: Goal: Will not experience complications related to bowel motility Outcome: Progressing Goal: Will not experience complications related to urinary retention Outcome: Progressing   Problem: Pain Managment: Goal: General experience of comfort will improve Outcome: Progressing   Problem: Safety: Goal: Ability to remain free from injury will improve Outcome: Progressing   Problem: Skin Integrity: Goal: Risk for impaired skin integrity will decrease Outcome: Progressing

## 2019-10-26 NOTE — Progress Notes (Addendum)
PROGRESS NOTE  William Ali  ACZ:[id number] DOB: 11-06-1985 DOA: 10/12/2019 PCP: Elsie Stain, MD  Outpatient Specialists: Oncology, Dr. 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 Presents with complaints of worsening pain and diarrhea. Found to have transverse colitis causing severe sepsis as well as worsening of his chronic cancer related pain. Palliative care was consulted. Patient was started on fentanyl patch but started having myoclonic jerk as well as severe hallucination. Decision was made to transition patient to fentanyl PCA and monitor dose requirement. Along with that the patient is also be undergoing radiation therapy for palliation.  Currently plan is continue pain control.  Assessment & Plan: Principal Problem:   Sepsis ([place]) Active Problems:   Goals of care, counseling/discussion   Metastatic adenocarcinoma to brain [facility])   Adenocarcinoma of lung ([place])   Essential hypertension   Tachycardia   Colitis   Hypokalemia   Prolonged Q-T interval on ECG   Hypomagnesemia   Cancer related pain   Chest pain   Generalized abdominal pain  Acute on chronic cancer-related back pain. Myoclonic jerks. Visual and auditory hallucination. Tremors. Continues to have intractable back pain. Greatly appreciate palliative care assistance with management. Fentanyl seems to be best tolerated opioid. Transitioned PCA basal to patch dosing and has improved with limited side effects. Refusing bolus dosing.  - Continue prn toradol which was very effective.  - Continuing steroids as well for bone metastasis-related pain.  - Appreciate radiation oncology's assistance as well with XRT.    Severe sepsis, present on admission secondary to acute transverse colitis. With tachycardia tachypnea and lactic acidosis meeting sepsis  criteria. Treated with IV fluids. Also treated with IV broad-spectrum antibiotics and vancomycin 125mg  po QID x10 days due to +toxigenic CDiff PCR. Currently antibiotics are discontinued. Symptoms resolved durably, can DC enteric precautions.  History of DVT and PE. - Restarted xarelto  Adenocarcinoma of the lung with metastasis to bone, liver, brain. - Follow up with oncology, Dr. Lorenso Courier. - Follow up with Rad Onc  Chemotherapy-induced pancytopenia. Platelets significantly lower along with rest of the blood count. Currently Dr. Lorenso Courier from hematology okay with anticoagulation as long as platelets remain above 30. - Continue xarelto for hx DVT/PE, malignancy while monitoring blood counts. - Platelets sustaining improvement.   Hypomagnesemia - Replaced.  Hypokalemia:  - Supplement.   Obesity: Body mass index is 33.05 kg/m.   Delirium - Minimize polypharmacy. - Lactulose given empirically.  LFT elevation. Likely in response to liver involvement with metastasis. Mentation ok.  - Monitoring, showing sustained improvement.   DVT prophylaxis: Xarelto Code Status: Full Family Communication: None at bedside Disposition Plan:  Status is: Inpatient  Remains inpatient appropriate because:Ongoing active pain requiring inpatient pain management   Dispo:  Patient From: Home  Planned Disposition: Home with Health Care Svc  Expected discharge date: 10/27/19  Medically stable for discharge: No  Consultants:   Oncology  Rad Onc  Palliative care  Procedures:   None  Antimicrobials:  Vancomycin po x 10 days   Subjective: Did not sleep well at all last night, still had some mild and redirectable delusions/hallucinations per wife which pt recalls. Eating well, last watery stool was ~1 week ago, had formed stool several days ago and nothing since. No abd pain or fever.   Objective: Vitals:   10/26/19 1093 10/26/19 0959 10/26/19 1209 10/26/19 1557  BP: (!) 159/103 (!)  160/100 (!) 157/95 130/72  Pulse: 88 89 96 92  Resp: (!) 22 19 (!) 25 (!) 26  Temp: 98.4 F (36.9 C)  98.1 F (36.7 C) 98.3 F (36.8 C)  TempSrc: Oral  Oral Oral  SpO2: 100% 100% 99% 98%  Weight:      Height:        Intake/Output Summary (Last 24 hours) at 10/26/2019 1740 Last data filed at 10/26/2019 1454 Gross per 24 hour  Intake 480 ml  Output 201 ml  Net 279 ml   Filed Weights   10/12/19 1541 10/13/19 1611  Weight: 95.3 kg 95.7 kg   Gen: 34 y.o. male in no distress Pulm: Nonlabored breathing room air. Clear. CV: Regular rate and rhythm. No murmur, rub, or gallop. No JVD, no dependent edema. GI: Abdomen soft, non-tender, non-distended, with normoactive bowel sounds.  Ext: Warm, no deformities Skin: No rashes, lesions or ulcers on visualized skin. Port site c/d/i, nontender. Neuro: Alert and oriented. No focal neurological deficits. Psych: Judgement and insight appear fair. Mood euthymic & affect congruent. Behavior is appropriate.    Data Reviewed: I have personally reviewed following labs and imaging studies  CBC: Recent Labs  Lab 10/22/19 0542 10/23/19 0455 10/24/19 0320 10/25/19 0530 10/26/19 0450  WBC 8.1 8.3 7.1 7.9 7.6  NEUTROABS 6.3 6.5 5.4 6.0 6.0  HGB 9.3* 9.0* 8.6* 8.8* 8.4*  HCT 30.2* 29.8* 28.9* 29.0* 28.0*  MCV 91.5 93.4 95.1 93.2 93.6  PLT 59* 87* 112* 122* 147*   Basic Metabolic Panel: Recent Labs  Lab 10/22/19 0542 10/23/19 0455 10/24/19 0320 10/25/19 0530 10/26/19 0450  NA 142 141 145 145 144  K 4.3 3.7 3.5 3.4* 3.3*  CL 105 106 110 109 109  CO2 25 24 25 26 25   GLUCOSE 149* 87 107* 99 122*  BUN 10 9 12 12 12   CREATININE 0.91 0.91 0.89 0.86 0.82  CALCIUM 8.9 8.3* 8.3* 8.4* 8.3*  MG  --  1.7 1.8 1.8 1.8   GFR: Estimated Creatinine Clearance: 138.5 mL/min (by C-G formula based on SCr of 0.82 mg/dL). Liver Function Tests: Recent Labs  Lab 10/22/19 1236 10/23/19 0455 10/24/19 0320 10/25/19 0530 10/26/19 0450  AST 119* 121*  118* 116* 91*  ALT 92* 96* 103* 101* 96*  ALKPHOS 380* 364* 345* 366* 344*  BILITOT 0.6 0.8 0.5 0.5 0.5  PROT 6.3* 6.2* 6.1* 6.1* 5.9*  ALBUMIN 2.6* 2.8* 2.8* 2.8* 2.8*   No results for input(s): LIPASE, AMYLASE in the last 168 hours. Recent Labs  Lab 10/22/19 1236  AMMONIA 23   Coagulation Profile: No results for input(s): INR, PROTIME in the last 168 hours. Cardiac Enzymes: No results for input(s): CKTOTAL, CKMB, CKMBINDEX, TROPONINI in the last 168 hours. BNP (last 3 results) No results for input(s): PROBNP in the last 8760 hours. HbA1C: No results for input(s): HGBA1C in the last 72 hours. CBG: No results for input(s): GLUCAP in the last 168 hours. Lipid Profile: No results for input(s): CHOL, HDL, LDLCALC, TRIG, CHOLHDL, LDLDIRECT in the last 72 hours. Thyroid Function Tests: No results for input(s): TSH, T4TOTAL, FREET4, T3FREE, THYROIDAB in the last 72 hours. Anemia Panel: No results for input(s): VITAMINB12, FOLATE, FERRITIN, TIBC, IRON, RETICCTPCT in the last 72 hours. Urine analysis:    Component Value Date/Time   COLORURINE YELLOW 06/02/2019 1838   APPEARANCEUR CLEAR 06/02/2019 1838   LABSPEC 1.016 06/02/2019 1838   PHURINE 5.0 06/02/2019 1838   GLUCOSEU NEGATIVE 06/02/2019  [place] (A) 06/02/2019 1838   BILIRUBINUR NEGATIVE 06/02/2019 1838   KETONESUR NEGATIVE 06/02/2019 1838   PROTEINUR NEGATIVE 06/02/2019 1838   UROBILINOGEN 1.0 05/02/2014 1253   NITRITE NEGATIVE 06/02/2019 1838   LEUKOCYTESUR NEGATIVE 06/02/2019 1838   Scheduled Meds: . sodium chloride   Intravenous Once  . Chlorhexidine Gluconate Cloth  6 each Topical Daily  . dexamethasone  2 mg Oral Daily  . dicyclomine  10 mg Oral TID AC  . DULoxetine  60 mg Oral Daily  . feeding supplement (ENSURE ENLIVE)  237 mL Oral BID BM  . fentaNYL  1 patch Transdermal Q72H  . folic acid  1 mg Oral Daily  . lactulose  20 g Oral Daily  . lidocaine  1 patch Transdermal Q24H  . metoprolol  succinate  50 mg Oral Daily  . potassium chloride  40 mEq Oral Once  . rivaroxaban  20 mg Oral Q supper  . sodium chloride flush  10-40 mL Intracatheter Q12H  . sodium chloride flush  3 mL Intravenous Q12H   Continuous Infusions:   LOS: 14 days   Time spent: 25 minutes.  Patrecia Pour, MD [facility] [email or web address] 10/26/2019, 5:40 PM

## 2019-10-26 NOTE — Progress Notes (Signed)
Patient asking for something to help him sleep.  RN explained that we do not usually give sleep medications during the day.  Patient verbalized understanding.  RN asked if he wanted something for sleep tonight.  Patient stated, "That would be great."  Dr. Hilma Favors notified.

## 2019-10-27 ENCOUNTER — Ambulatory Visit
Admission: RE | Admit: 2019-10-27 | Discharge: 2019-10-27 | Disposition: A | Discharge status: Home / Self Care | Payer: Medicaid Other | Source: Ambulatory Visit | Attending: Radiation Oncology | Admitting: Radiation Oncology

## 2019-10-27 DIAGNOSIS — C7951 Secondary malignant neoplasm of bone: Secondary | ICD-10-CM | POA: Insufficient documentation

## 2019-10-27 DIAGNOSIS — Z510 Encounter for antineoplastic radiation therapy: Secondary | ICD-10-CM | POA: Insufficient documentation

## 2019-10-27 DIAGNOSIS — C7931 Secondary malignant neoplasm of brain: Secondary | ICD-10-CM | POA: Insufficient documentation

## 2019-10-27 DIAGNOSIS — R6520 Severe sepsis without septic shock: Secondary | ICD-10-CM

## 2019-10-27 DIAGNOSIS — C3411 Malignant neoplasm of upper lobe, right bronchus or lung: Secondary | ICD-10-CM | POA: Insufficient documentation

## 2019-10-27 LAB — CBC WITH DIFFERENTIAL/PLATELET
Abs Immature Granulocytes: 0.05 10*3/uL (ref 0.00–0.07)
Basophils Absolute: 0 10*3/uL (ref 0.0–0.1)
Basophils Relative: 0 %
Eosinophils Absolute: 0 10*3/uL (ref 0.0–0.5)
Eosinophils Relative: 0 %
HCT: 29 % — ABNORMAL LOW (ref 39.0–52.0)
Hemoglobin: 8.7 g/dL — ABNORMAL LOW (ref 13.0–17.0)
Immature Granulocytes: 1 %
Lymphocytes Relative: 3 %
Lymphs Abs: 0.2 10*3/uL — ABNORMAL LOW (ref 0.7–4.0)
MCH: 28.2 pg (ref 26.0–34.0)
MCHC: 30 g/dL (ref 30.0–36.0)
MCV: 93.9 fL (ref 80.0–100.0)
Monocytes Absolute: 0.8 10*3/uL (ref 0.1–1.0)
Monocytes Relative: 13 %
Neutro Abs: 5.3 10*3/uL (ref 1.7–7.7)
Neutrophils Relative %: 83 %
Platelets: 162 10*3/uL (ref 150–400)
RBC: 3.09 MIL/uL — ABNORMAL LOW (ref 4.22–5.81)
RDW: 21.1 % — ABNORMAL HIGH (ref 11.5–15.5)
WBC: 6.4 10*3/uL (ref 4.0–10.5)
nRBC: 1.1 % — ABNORMAL HIGH (ref 0.0–0.2)

## 2019-10-27 LAB — COMPREHENSIVE METABOLIC PANEL
ALT: 106 U/L — ABNORMAL HIGH (ref 0–44)
AST: 91 U/L — ABNORMAL HIGH (ref 15–41)
Albumin: 2.7 g/dL — ABNORMAL LOW (ref 3.5–5.0)
Alkaline Phosphatase: 353 U/L — ABNORMAL HIGH (ref 38–126)
Anion gap: 10 (ref 5–15)
BUN: 15 mg/dL (ref 6–20)
CO2: 25 mmol/L (ref 22–32)
Calcium: 8.4 mg/dL — ABNORMAL LOW (ref 8.9–10.3)
Chloride: 110 mmol/L (ref 98–111)
Creatinine, Ser: 0.88 mg/dL (ref 0.61–1.24)
GFR calc Af Amer: >60 mL/min (ref >60)
GFR calc non Af Amer: >60 mL/min (ref >60)
Glucose, Bld: 131 mg/dL — ABNORMAL HIGH (ref 70–99)
Potassium: 3.8 mmol/L (ref 3.5–5.1)
Sodium: 145 mmol/L (ref 135–145)
Total Bilirubin: 0.5 mg/dL (ref 0.3–1.2)
Total Protein: 5.9 g/dL — ABNORMAL LOW (ref 6.5–8.1)

## 2019-10-27 LAB — MAGNESIUM: Magnesium: 1.8 mg/dL (ref 1.7–2.4)

## 2019-10-27 MED ORDER — ALPRAZOLAM 1 MG PO TABS
1.0000 mg | ORAL_TABLET | ORAL | Status: DC | PRN
  Administered 2019-10-27 – 2019-10-28 (×3): 1 mg via ORAL
  Filled 2019-10-27 (×3): qty 1

## 2019-10-27 MED ORDER — OXYCODONE HCL 5 MG PO TABS
10.0000 mg | ORAL_TABLET | ORAL | Status: DC | PRN
  Administered 2019-10-27 – 2019-10-28 (×2): 10 mg via ORAL
  Filled 2019-10-27 (×2): qty 2

## 2019-10-27 MED ORDER — LORAZEPAM 2 MG/ML IJ SOLN: 0.5000 mg | INTRAMUSCULAR | Status: DC | PRN

## 2019-10-27 MED ORDER — MAGNESIUM OXIDE 400 (241.3 MG) MG PO TABS
800.0000 mg | ORAL_TABLET | Freq: Every day | ORAL | Status: DC
  Administered 2019-10-27 – 2019-10-28 (×2): 800 mg via ORAL
  Filled 2019-10-27 (×2): qty 2

## 2019-10-27 NOTE — Progress Notes (Signed)
Palliative Care Progress Note  Met with William Ali and his wife this afternoon to discuss his pain and symptom management moving forward and in anticipation of discharge from the hospital. He is now off IV infusions and has not received any IV prn pain doses in the last 24 hours. He however tells me he did not sleep well last night- he is having a difficult time with anxiety and generalized discomfort. He has been in the hospital for an extended period of time and is processing everything going on with him. When he does not sleep well he has issues with short term memory loss during the day and can have some periods of confusion when he is exhausted. In general we have made progress with his pain- he is more deconditioned now but still trying to get out of bed and move around more. His wife brought him dinner this evening because he has not had a good appetite and this is likely multifactorial. He I receiving radiation to his spine and tolerating this well.  Plan is to discharge tomorrow if he tolerates being off IV medication. I have ordered alprazolam for sleep, and a back up dose of IV ativan if he has anxiety and panic issues.  Regimen Recommended for discharge on 10/2:  1. Duragesic 59mg patch q72 hours (may need to switch this to q48 is starts to wear off before 72 hours. Dispense 1 month supply.   2. Oxycodone 164mbreakthrough dose up to 2 times a day. Dispense 1 month supply.  3. Needs daily Magnesium supplementation. Chronically low leading to hypokalemia and muscle weakness.  4. Sennekot 2 tabs QHS and Miralax PRN for opioid induced constipation.  5. Decadron 63m36maily indefinitely.  6. Replace Toradol with Celebrex for less GI side effects and less risk in general since he is also on chronic anticoagulation. Celebrex 200m80mily- alternative if cost is a limiting factor would be high dose Ibuprofen 800 mg twice to three times daily along with PPI or H2 blocker.  7. Continue Radiation  for spine mets and pain control.  8. He needs to receive Zometa after discharge- a dental exam is recommended prior to administration but shouldn't delay him receiving this at this point unless there is obvious dental erosions and untreated caries.  I am available to follow up with him in the cancer center as needed and for any additional palliative care needs.  [facility]  Time: 35 min Greater than 50%  of this time was spent counseling and coordinating care related to the above assessment and plan.

## 2019-10-27 NOTE — Progress Notes (Signed)
PROGRESS NOTE  William Ali  ERD:[id number] DOB: Mar 18, 1985 DOA: 10/12/2019 PCP: Elsie Stain, MD  Outpatient Specialists: Oncology, Dr. 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 Presents with complaints of worsening pain and diarrhea. Found to have transverse colitis causing severe sepsis as well as worsening of his chronic cancer related pain.  Palliative care was consulted.  Patient was started on fentanyl patch but started having myoclonic jerk as well as severe hallucination.  Decision was made to transition patient to fentanyl PCA and monitor dose requirement.  Along with that the patient is also be undergoing radiation therapy for palliation.  Today, patient resting comfortably, some pain although not as bad as previous.  No hallucinations.  Assessment & Plan: Principal Problem:   Sepsis ([place]) Active Problems:   Goals of care, counseling/discussion   Metastatic adenocarcinoma to brain [facility])   Adenocarcinoma of lung ([place])   Essential hypertension   Tachycardia   Colitis   Hypokalemia   Prolonged Q-T interval on ECG   Hypomagnesemia   Cancer related pain   Chest pain   Generalized abdominal pain  Acute on chronic cancer-related back pain. Myoclonic jerks. Visual and auditory hallucination. Tremors. Continues to have intractable back pain, appears to be much improved. Greatly appreciate palliative care assistance with management. Fentanyl seems to be best tolerated opioid. Continuing steroids, anti-inflammatories as well for bone metastasis-related pain.  - Continue PCA, converted to non-PCA 9/30.  - Appreciate radiation oncology's assistance as well.  We will monitor recommendation.  Severe sepsis, present on admission secondary to acute transverse colitis. With tachycardia tachypnea and lactic acidosis meeting sepsis  criteria. Treated with IV fluids.  Sepsis felt to be resolved. Also treated with IV broad-spectrum antibiotics and vancomycin 125mg  po QID x10 days due to +toxigenic CDiff PCR. Currently antibiotics are discontinued.  History of DVT and PE. - Restarted xarelto  Adenocarcinoma of the lung with metastasis to bone, liver, brain. - Follow up with oncology, Dr. Lorenso Courier. - Follow up with Rad Onc  Chemotherapy-induced pancytopenia. Platelets significantly lower along with rest of the blood count. Currently Dr. Lorenso Courier from hematology okay with anticoagulation as long as platelets remain above 30. - Continue xarelto for hx DVT/PE, malignancy  Hypomagnesemia - Replaced.  Obesity: Body mass index is 33.05 kg/m.   Delirium - Minimize polypharmacy.  LFT elevation. Likely in response to liver involvement with metastasis. Mentation ok.  - Monitoring, staying stable  Telemetry personally reviewed on 9/29, only sinus tachycardia, usually reportedly coincident with pain. Ok to DC telemetry to facilitate mobility.   DVT prophylaxis: Xarelto Code Status: Full Family Communication: Left message for family Disposition Plan:  Status is: Inpatient  Remains inpatient appropriate because:Ongoing active pain requiring inpatient pain management   Dispo:  Patient From: Home  Planned Disposition: Home with Health Care Svc  Expected discharge date: 10/28/19  Medically stable for discharge: No  Consultants:   Oncology  Rad Onc  Palliative care  Procedures:   None  Antimicrobials:  Vancomycin po x 10 days     Objective: Vitals:   10/26/19 1557 10/26/19 2202 10/27/19 0454 10/27/19 1344  BP: 130/72 (!) 156/103 (!) 148/85 (!) 146/93  Pulse: 92 (!) 101 90 89  Resp: (!) 26 20 (!) 21 (!) 23  Temp: 98.3 F (36.8 C) 98.3 F (36.8 C) 97.7 F (36.5 C) 97.9 F (36.6 C)  TempSrc: Oral Oral Oral Oral  SpO2: 98% 99% 100% 100%  Weight:      Height:        Intake/Output Summary  (Last 24 hours) at 10/27/2019 1522 Last data filed at 10/27/2019 1200 Gross per 24 hour  Intake 140 ml  Output 800 ml  Net -660 ml   Filed Weights   10/12/19 1541 10/13/19 1611  Weight: 95.3 kg 95.7 kg    Gen: Alert and oriented x3, no acute distress Pulm: Clear to auscultation bilaterally, breathing not labored CV: Regular rate and rhythm, S1-S2 GI: Abdomen soft, nontender, nondistended, normoactive bowel sounds Ext: No clubbing or cyanosis or edema Skin: No skin breaks, tears or lesions Neuro: No focal deficits Psych: Appropriate, no evidence of psychoses  Data Reviewed: I have personally reviewed following labs and imaging studies  CBC: Recent Labs  Lab 10/23/19 0455 10/24/19 0320 10/25/19 0530 10/26/19 0450 10/27/19 0441  WBC 8.3 7.1 7.9 7.6 6.4  NEUTROABS 6.5 5.4 6.0 6.0 5.3  HGB 9.0* 8.6* 8.8* 8.4* 8.7*  HCT 29.8* 28.9* 29.0* 28.0* 29.0*  MCV 93.4 95.1 93.2 93.6 93.9  PLT 87* 112* 122* 145* 161   Basic Metabolic Panel: Recent Labs  Lab 10/23/19 0455 10/24/19 0320 10/25/19 0530 10/26/19 0450 10/27/19 0441  NA 141 145 145 144 145  K 3.7 3.5 3.4* 3.3* 3.8  CL 106 110 109 109 110  CO2 24 25 26 25 25   GLUCOSE 87 107* 99 122* 131*  BUN 9 12 12 12 15   CREATININE 0.91 0.89 0.86 0.82 0.88  CALCIUM 8.3* 8.3* 8.4* 8.3* 8.4*  MG 1.7 1.8 1.8 1.8 1.8   GFR: Estimated Creatinine Clearance: 129.1 mL/min (by C-G formula based on SCr of 0.88 mg/dL). Liver Function Tests: Recent Labs  Lab 10/23/19 0455 10/24/19 0320 10/25/19 0530 10/26/19 0450 10/27/19 0441  AST 121* 118* 116* 91* 91*  ALT 96* 103* 101* 96* 106*  ALKPHOS 364* 345* 366* 344* 353*  BILITOT 0.8 0.5 0.5 0.5 0.5  PROT 6.2* 6.1* 6.1* 5.9* 5.9*  ALBUMIN 2.8* 2.8* 2.8* 2.8* 2.7*   No results for input(s): LIPASE, AMYLASE in the last 168 hours. Recent Labs  Lab 10/22/19 1236  AMMONIA 23   Coagulation Profile: No results for input(s): INR, PROTIME in the last 168 hours. Cardiac Enzymes: No  results for input(s): CKTOTAL, CKMB, CKMBINDEX, TROPONINI in the last 168 hours. BNP (last 3 results) No results for input(s): PROBNP in the last 8760 hours. HbA1C: No results for input(s): HGBA1C in the last 72 hours. CBG: No results for input(s): GLUCAP in the last 168 hours. Lipid Profile: No results for input(s): CHOL, HDL, LDLCALC, TRIG, CHOLHDL, LDLDIRECT in the last 72 hours. Thyroid Function Tests: No results for input(s): TSH, T4TOTAL, FREET4, T3FREE, THYROIDAB in the last 72 hours. Anemia Panel: No results for input(s): VITAMINB12, FOLATE, FERRITIN, TIBC, IRON, RETICCTPCT in the last 72 hours. Urine analysis:    Component Value Date/Time   COLORURINE YELLOW 06/02/2019 1838   APPEARANCEUR CLEAR 06/02/2019 1838   LABSPEC 1.016 06/02/2019 1838   PHURINE 5.0 06/02/2019 1838   GLUCOSEU NEGATIVE 06/02/2019 1838   HGBUR LARGE (A) 06/02/2019 1838   BILIRUBINUR NEGATIVE 06/02/2019 1838   KETONESUR NEGATIVE 06/02/2019 1838   PROTEINUR NEGATIVE 06/02/2019 1838   UROBILINOGEN 1.0 05/02/2014 1253   NITRITE NEGATIVE 06/02/2019 1838   LEUKOCYTESUR NEGATIVE 06/02/2019 1838   No results found for this or any previous visit (from the past 240 hour(s)).    Radiology Studies:  No results found.  Scheduled Meds: . sodium chloride   Intravenous Once  . Chlorhexidine Gluconate Cloth  6 each Topical Daily  . dexamethasone  2 mg Oral Daily  . dicyclomine  10 mg Oral TID AC  . DULoxetine  60 mg Oral Daily  . feeding supplement (ENSURE ENLIVE)  237 mL Oral BID BM  . fentaNYL  1 patch Transdermal Q72H  . folic acid  1 mg Oral Daily  . lactulose  20 g Oral Daily  . lidocaine  1 patch Transdermal Q24H  . metoprolol succinate  50 mg Oral Daily  . rivaroxaban  20 mg Oral Q supper  . sodium chloride flush  10-40 mL Intracatheter Q12H  . sodium chloride flush  3 mL Intravenous Q12H   Continuous Infusions:   LOS: 15 days   Time spent: 25 minutes.  Annita Brod, MD [facility] [email or web address] 10/27/2019, 3:22 PM

## 2019-10-27 NOTE — Plan of Care (Signed)
  Problem: Clinical Measurements: Goal: Will remain free from infection Outcome: Progressing Goal: Diagnostic test results will improve Outcome: Progressing Goal: Respiratory complications will improve Outcome: Progressing Goal: Cardiovascular complication will be avoided Outcome: Progressing   Problem: Activity: Goal: Risk for activity intolerance will decrease Outcome: Progressing   Problem: Coping: Goal: Level of anxiety will decrease Outcome: Progressing   Problem: Elimination: Goal: Will not experience complications related to bowel motility Outcome: Progressing Goal: Will not experience complications related to urinary retention Outcome: Progressing   Problem: Pain Managment: Goal: General experience of comfort will improve Outcome: Progressing   Problem: Safety: Goal: Ability to remain free from injury will improve Outcome: Progressing   Problem: Skin Integrity: Goal: Risk for impaired skin integrity will decrease Outcome: Progressing

## 2019-10-27 NOTE — Plan of Care (Signed)
  Problem: Coping: Goal: Level of anxiety will decrease Outcome: Progressing   Problem: Pain Managment: Goal: General experience of comfort will improve Outcome: Progressing   

## 2019-10-28 ENCOUNTER — Other Ambulatory Visit: Payer: Self-pay | Admitting: Internal Medicine

## 2019-10-28 LAB — CBC WITH DIFFERENTIAL/PLATELET
Abs Immature Granulocytes: 0.06 10*3/uL (ref 0.00–0.07)
Basophils Absolute: 0 10*3/uL (ref 0.0–0.1)
Basophils Relative: 0 %
Eosinophils Absolute: 0 10*3/uL (ref 0.0–0.5)
Eosinophils Relative: 0 %
HCT: 29.7 % — ABNORMAL LOW (ref 39.0–52.0)
Hemoglobin: 9.1 g/dL — ABNORMAL LOW (ref 13.0–17.0)
Immature Granulocytes: 1 %
Lymphocytes Relative: 4 %
Lymphs Abs: 0.3 10*3/uL — ABNORMAL LOW (ref 0.7–4.0)
MCH: 28.8 pg (ref 26.0–34.0)
MCHC: 30.6 g/dL (ref 30.0–36.0)
MCV: 94 fL (ref 80.0–100.0)
Monocytes Absolute: 0.7 10*3/uL (ref 0.1–1.0)
Monocytes Relative: 10 %
Neutro Abs: 5.9 10*3/uL (ref 1.7–7.7)
Neutrophils Relative %: 85 %
Platelets: 188 10*3/uL (ref 150–400)
RBC: 3.16 MIL/uL — ABNORMAL LOW (ref 4.22–5.81)
RDW: 20.7 % — ABNORMAL HIGH (ref 11.5–15.5)
WBC: 6.9 10*3/uL (ref 4.0–10.5)
nRBC: 1 % — ABNORMAL HIGH (ref 0.0–0.2)

## 2019-10-28 LAB — MAGNESIUM: Magnesium: 1.8 mg/dL (ref 1.7–2.4)

## 2019-10-28 MED ORDER — IBUPROFEN 800 MG PO TABS: 800.0000 mg | ORAL_TABLET | Freq: Two times a day (BID) | ORAL | 0 refills | Status: AC

## 2019-10-28 MED ORDER — FENTANYL 50 MCG/HR TD PT72
1.0000 | MEDICATED_PATCH | TRANSDERMAL | 0 refills | Status: DC
Start: 2019-10-28 — End: 2019-10-28

## 2019-10-28 MED ORDER — MAGNESIUM OXIDE 400 (241.3 MG) MG PO TABS: 800.0000 mg | ORAL_TABLET | Freq: Every day | ORAL | 3 refills | Status: AC

## 2019-10-28 MED ORDER — FENTANYL 50 MCG/HR TD PT72
1.0000 | MEDICATED_PATCH | TRANSDERMAL | 0 refills | Status: DC
Start: 2019-10-28 — End: 2019-10-31

## 2019-10-28 MED ORDER — PANTOPRAZOLE SODIUM 40 MG PO TBEC: 40.0000 mg | DELAYED_RELEASE_TABLET | Freq: Two times a day (BID) | ORAL | Status: DC

## 2019-10-28 MED ORDER — ENSURE ENLIVE PO LIQD: 237.0000 mL | Freq: Two times a day (BID) | ORAL | 12 refills | Status: AC

## 2019-10-28 MED ORDER — DEXAMETHASONE 2 MG PO TABS
2.0000 mg | ORAL_TABLET | Freq: Every day | ORAL | 3 refills | Status: DC
Start: 2019-10-29 — End: 2019-12-26

## 2019-10-28 MED ORDER — HEPARIN SOD (PORK) LOCK FLUSH 100 UNIT/ML IV SOLN
500.0000 [IU] | INTRAVENOUS | Status: AC | PRN
  Administered 2019-10-28: 500 [IU]
  Filled 2019-10-28: qty 5

## 2019-10-28 MED ORDER — ALPRAZOLAM 1 MG PO TABS: 1.0000 mg | ORAL_TABLET | ORAL | 0 refills | Status: AC | PRN

## 2019-10-28 MED ORDER — POLYVINYL ALCOHOL 1.4 % OP SOLN: 1.0000 [drp] | OPHTHALMIC | 0 refills | Status: AC | PRN

## 2019-10-28 MED ORDER — FENTANYL 50 MCG/HR TD PT72: 1.0000 | MEDICATED_PATCH | TRANSDERMAL | 0 refills | Status: DC

## 2019-10-28 MED ORDER — PANTOPRAZOLE SODIUM 40 MG PO TBEC: 40.0000 mg | DELAYED_RELEASE_TABLET | Freq: Two times a day (BID) | ORAL | 3 refills | Status: AC

## 2019-10-28 MED ORDER — PANTOPRAZOLE SODIUM 40 MG PO TBEC: 40.0000 mg | DELAYED_RELEASE_TABLET | Freq: Two times a day (BID) | ORAL | Status: DC | PRN

## 2019-10-28 MED ORDER — POLYETHYLENE GLYCOL 3350 17 G PO PACK: 17.0000 g | PACK | Freq: Every day | ORAL | 0 refills | Status: AC | PRN

## 2019-10-28 MED ORDER — POLYETHYLENE GLYCOL 3350 17 G PO PACK: 17.0000 g | PACK | Freq: Every day | ORAL | Status: DC | PRN

## 2019-10-28 MED ORDER — SENNOSIDES-DOCUSATE SODIUM 8.6-50 MG PO TABS: 2.0000 | ORAL_TABLET | Freq: Every day | ORAL | Status: AC

## 2019-10-28 MED ORDER — OXYCODONE HCL 10 MG PO TABS
10.0000 mg | ORAL_TABLET | Freq: Two times a day (BID) | ORAL | 0 refills | Status: DC | PRN
Start: 2019-10-28 — End: 2019-11-02

## 2019-10-28 NOTE — Progress Notes (Signed)
PMT brief progress note  Mr Bailly is awake alert, on the phone, resting comfortably, rested well overnight, his pain is currently at a 2-3/10. Denies acute complaints. Is anticipating discharge today. Also discussed with bedside RN.  BP (!) 150/98 (BP Location: Left Arm)   Pulse 98   Temp 97.6 F (36.4 C) (Oral)   Resp 18   Ht 5\' 7"  (1.702 m)   Wt 95.7 kg   SpO2 100%   BMI 33.05 kg/m  Chart reviewed Corresponded briefly about him with my colleague Dr Hilma Favors.  Awake alert No distress No edema Regular work of breathing No myoclonus anymore Non focal PPS 60% Please see Dr Delanna Ahmadi note from 10-1 regarding symptom management recommendations.  [place] for discharge from palliative standpoint today. Recommend med onc and outpatient palliative follow up.  Greater than 50%  of this time was spent counseling and coordinating care related to the above assessment and plan. 15 minutes spent.  Loistine Chance MD [place] palliative [phone].

## 2019-10-28 NOTE — Progress Notes (Addendum)
Upon removal of PCA pump from room this RN noticed there was a fentanyl syringe still in the pump. 3 mL or 150 mcg of fentanyl was wasted with Theora Gianotti, RN as witness.

## 2019-10-28 NOTE — Discharge Summary (Addendum)
Discharge Summary  William Ali WYO:[id number] DOB: December 13, 1985  PCP: Elsie Stain, MD  Admit date: 10/12/2019 Discharge date: 10/28/2019  Time spent: 35 minutes  Recommendations for Outpatient Follow-up:  1. Medication change: Duragesic patches 75 mcg every 72 hours discontinued and changed to 50 mcg every 72 hours 2. Medication change: Protonix 40 mg twice daily change from as needed to scheduled 3. New medication: Motrin 800 mg twice daily scheduled 4. Oxycodone 10 mg twice daily as needed for breakthrough pain 5. New medication: Magnesium 800 mg p.o. daily 6. New medication: MiraLAX as needed 7. Medication change: Decadron increased from 0.5 mg to 2 mg daily 8. New medication: Xanax 1 mg as needed for anxiety every 4 hours 9. Patient will continue with radiation oncology for spine metastases and pain control 10. We will coordinate with oncology to receive Zometa after discharge.dental exam is recommended prior to administration but shouldn't delay him receiving this at this point unless there is obvious dental erosions and untreated caries.  Discharge Diagnoses:  Active Hospital Problems   Diagnosis Date Noted  .  Severe sepsis ([place]) 10/12/2019  . Chest pain   . Generalized abdominal pain   . Prolonged Q-T interval on ECG 10/14/2019  . Hypomagnesemia 10/14/2019  . Cancer related pain 10/14/2019  . Tachycardia 10/12/2019  . Colitis 10/12/2019  . Hypokalemia 10/12/2019  . Adenocarcinoma of lung ([place]) 05/21/2019  . Metastatic adenocarcinoma to brain ([place]) 05/21/2019  . Goals of care, counseling/discussion 05/21/2019  . Essential hypertension 08/17/2018    Resolved Hospital Problems  No resolved problems to display.    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  At that time, he was found to have severe sepsis secondary to transverse colitis as well as worsening of his chronic cancer related pain.  Admitted to the hospitalist service and palliative care was consulted.  Hospital Course:  Principal Problem: Severe sepsis ([place]) Active Problems:   Goals of care, counseling/discussion   Metastatic adenocarcinoma to brain [facility])   Adenocarcinoma of lung ([place])   Essential hypertension   Tachycardia   Colitis   Hypokalemia   Prolonged Q-T interval on ECG   Hypomagnesemia   Cancer related pain   Chest pain   Generalized abdominal pain Acute on chronic cancer-related back pain. Myoclonic jerks. Visual and auditory hallucination. Tremors. Continues to have intractable back pain, appears to be much improved. Greatly appreciate palliative care assistance with management.  Initially started on fentanyl patch, but started having myoclonic jerking as well as severe hallucinations.  Transition to fentanyl PCA which he seemed to do somewhat better with.  Palliative care intervened.  By tracking PCA usage, noted only have 2 bolus doses over the course of 24 hours.  Patient transitioned from PCA to a transdermal patch.  By 10/1, patient much improved and patient made following recommendations of being on Duragesic 50 mcg patch every 72 hours.  Oxycodone 10 mg twice a day for breakthrough pain.  Continuing Senokot and adding as needed MiraLAX.  Daily supplemental magnesium.  Increasing Decadron from 0.5 to 2 mg daily.  Has been responding well to Toradol so changed over to ibuprofen 800 mg twice a day and changing his  as needed PPI to scheduled twice a day.  Initial recommendation was for Celebrex, however even with insurance approval, this medications $175 for every month.  Patient will continue with radiation for spine metastases and pain control.  He will need to receive Zometa after discharge.  Severe sepsis, present on admission secondary to acute transverse colitis. With tachycardia tachypnea and lactic acidosis meeting severe sepsis criteria. Treated with IV fluids.  Sepsis felt to be resolved. Also treated with IV broad-spectrum antibiotics and vancomycin 125mg  po QID x10 days due to +toxigenic CDiff PCR.  Antibiotics discontinued after 10-day course and infection felt to be resolved.  History of DVT and PE. - Restarted xarelto, continued upon discharge.  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 - Follow up with oncology, Dr. Lorenso Courier. - Follow up with Rad Onc  Chemotherapy-induced pancytopenia. Platelets significantly lower along with rest of the blood count. Currently Dr. Lorenso Courier from hematology okay with anticoagulation as long as platelets remain above 30.  On day of discharge, platelet count at 188. - Continue xarelto for hx DVT/PE, malignancy  Hypomagnesemia - Replaced.  Started on mag oxide during his hospitalization, continue as outpatient indefinitely.  Obesity:  Patient meets criteria with body mass index is 33.05 kg/m.   Delirium - Minimize polypharmacy.  Appreciate palliative care help  LFT elevation. Likely in response to liver involvement with metastasis. Mentation ok.   Consultants:   Oncology  Rad Onc  Palliative care  Procedures:   None  Discharge Exam: BP (!) 160/105 (BP Location: Right Arm)   Pulse 97   Temp 97.8 F (36.6 C) (Oral)   Resp (!) 30   Ht 5\' 7"  (1.702 m)   Wt 95.7 kg   SpO2 100%   BMI 33.05 kg/m   General: Alert and oriented x3, no acute distress Cardiovascular: Regular rate and rhythm, S1-S2 Respiratory:  Clear to auscultation bilaterally  Discharge Instructions You were cared for by a hospitalist during your hospital stay. If you have any questions about your discharge medications or the care you received while you were in the hospital after you are discharged, you can call the unit and asked to speak with the hospitalist on call if the hospitalist that took care of you is not available. Once you are discharged, your primary care physician will handle any further medical issues. Please note that NO REFILLS for any discharge medications will be authorized once you are discharged, as it is imperative that you return to your primary care physician (or establish a relationship with a primary care physician if you do not have one) for your aftercare needs so that they can reassess your need for medications and monitor your lab values.  Discharge Instructions    Diet - low sodium heart healthy   Complete by: As directed    Increase activity slowly   Complete by: As directed      Allergies as of 10/28/2019      Reactions   Onion Anaphylaxis   Other    All sea food causes swelling and a rash.     Peanut Oil Itching   Peanut-containing Drug Products Itching      Medication List  STOP taking these medications   alum & mag hydroxide-simeth [phone] MG/5ML suspension Commonly known as: Maalox Multi Symptom Max St   fentaNYL 75 MCG/HR Commonly known as: DURAGESIC Replaced by: fentaNYL 50 MCG/HR   HYDROmorphone 8 MG tablet Commonly known as: Dilaudid     TAKE these medications   albuterol 108 (90 Base) MCG/ACT inhaler Commonly known as: VENTOLIN HFA TAKE 2 PUFFS BY MOUTH EVERY 6 HOURS AS NEEDED FOR WHEEZE OR SHORTNESS OF BREATH What changed: See the new instructions.   ALPRAZolam 1 MG tablet Commonly known as: XANAX Take 1 tablet (1 mg total) by mouth every 4 (four) hours as needed for anxiety.   dexamethasone 2 MG tablet Commonly known as: DECADRON Take 1 tablet (2 mg total) by  mouth daily. Start taking on: October 29, 2019 What changed:   medication strength  how much to take  additional instructions   DULoxetine 30 MG capsule Commonly known as: CYMBALTA Take 1 capsule (30 mg total) by mouth daily.   feeding supplement (ENSURE ENLIVE) Liqd Take 237 mLs by mouth 2 (two) times daily between meals.   fentaNYL 50 MCG/HR Commonly known as: [place] 1 patch onto the skin every 3 (three) days. Replaces: fentaNYL 75 MCG/HR   folic acid 1 MG tablet Commonly known as: FOLVITE Take 1 tablet (1 mg total) by mouth daily.   ibuprofen 800 MG tablet Commonly known as: IBU Take 1 tablet (800 mg total) by mouth in the morning and at bedtime.   lidocaine-prilocaine cream Commonly known as: EMLA Apply 1 application topically as needed. What changed: reasons to take this   LORazepam 1 MG tablet Commonly known as: ATIVAN Take 1 tablet (1 mg total) by mouth as directed. Please take 30 to 60 minutes prior to CT scans.   magnesium oxide 400 (241.3 Mg) MG tablet Commonly known as: MAG-OX Take 2 tablets (800 mg total) by mouth daily. Start taking on: October 29, 2019   metoprolol succinate 50 MG 24 hr tablet Commonly known as: Toprol XL Take 1 tablet (50 mg total) by mouth daily. Take with or immediately following a meal.   ondansetron 4 MG tablet Commonly known as: ZOFRAN Take 1 tablet (4 mg total) by mouth every 8 (eight) hours as needed for nausea or vomiting.   Oxycodone HCl 10 MG Tabs Take 1 tablet (10 mg total) by mouth every 12 (twelve) hours as needed for breakthrough pain.   pantoprazole 40 MG tablet Commonly known as: PROTONIX Take 1 tablet (40 mg total) by mouth 2 (two) times daily. What changed:   when to take this  reasons to take this   polyethylene glycol 17 g packet Commonly known as: MIRALAX / GLYCOLAX Take 17 g by mouth daily as needed for moderate constipation.   polyvinyl alcohol 1.4 % ophthalmic solution Commonly known  as: LIQUIFILM TEARS Place 1 drop into both eyes as needed for dry eyes.   rivaroxaban 20 MG Tabs tablet Commonly known as: XARELTO Take 1 tablet (20 mg total) by mouth daily with supper.   senna-docusate 8.6-50 MG tablet Commonly known as: Senokot-S Take 2 tablets by mouth at bedtime.       Allergies  Allergen Reactions  . Onion Anaphylaxis  . Other     All sea food causes swelling and a rash.    . Peanut Oil Itching  . Peanut-Containing Drug Products Itching      The results of significant diagnostics from this hospitalization (including imaging, microbiology, ancillary and  laboratory) are listed below for reference.    Significant Diagnostic Studies: DG Chest 2 View  Result Date: 10/12/2019 CLINICAL DATA:  Chest pain, shortness of breath EXAM: CHEST - 2 VIEW COMPARISON:  06/17/2019 FINDINGS: Right medial upper lobe mass/opacity again noted. Left lung clear. Heart is normal size. No effusions. Right Port-A-Cath in place, unchanged. IMPRESSION: Right upper lobe medial masslike opacity again noted. Electronically Signed   By: Rolm Baptise M.D.   On: 10/12/2019 11:30   CT Head Wo Contrast  Result Date: 10/12/2019 CLINICAL DATA:  Headache EXAM: CT HEAD WITHOUT CONTRAST TECHNIQUE: Contiguous axial images were obtained from the base of the skull through the vertex without intravenous contrast. COMPARISON:  06/17/2019 MRI 09/06/2019 FINDINGS: Brain: Area of low-density seen in the left frontal lobe subcortical white matter. No hemorrhage or hydrocephalus. Vascular: No hyperdense vessel or unexpected calcification. Skull: No acute calvarial abnormality. Sinuses/Orbits: Mucosal thickening in the sphenoid sinuses. No change. Other: None IMPRESSION: Area of subcortical low-density in the left frontal lobe likely related to edema surrounding 1 of the many brain lesions seen on prior MRI. No hemorrhage. Electronically Signed   By: Rolm Baptise M.D.   On: 10/12/2019 14:06   CT Angio Chest PE  W/Cm &/Or Wo Cm  Result Date: 10/12/2019 CLINICAL DATA:  PE suspected, metastatic lung cancer EXAM: CT ANGIOGRAPHY CHEST WITH CONTRAST TECHNIQUE: Multidetector CT imaging of the chest was performed using the standard protocol during bolus administration of intravenous contrast. Multiplanar CT image reconstructions and MIPs were obtained to evaluate the vascular anatomy. CONTRAST:  181mL OMNIPAQUE IOHEXOL 350 MG/ML SOLN COMPARISON:  CT chest abdomen pelvis, 09/12/2019 FINDINGS: Cardiovascular: Satisfactory opacification of the pulmonary arteries to the segmental level. No evidence of pulmonary embolism. Normal heart size. No pericardial effusion. Right chest port catheter Mediastinum/Nodes: Unchanged enlarged pretracheal and right hilar lymph nodes. Thyroid gland, trachea, and esophagus demonstrate no significant findings. Lungs/Pleura: New small right pleural effusion and associated atelectasis or consolidation. Unchanged post treatment appearance of a right upper lobe mass and surrounding paramedian consolidation Upper Abdomen: No acute abnormality. Musculoskeletal: No chest wall abnormality. Unchanged, extensive sclerotic osseous metastatic disease, notable for a pathologic fracture of the T3 vertebral body. Review of the MIP images confirms the above findings. IMPRESSION: 1. Negative examination for pulmonary embolism. 2. New small right pleural effusion and associated atelectasis or consolidation. 3. Unchanged post treatment appearance of a right upper lobe mass and surrounding paramedian consolidation when compared to recent staging CT. 4. Unchanged enlarged mediastinal lymph nodes and extensive sclerotic osseous metastatic disease. Electronically Signed   By: Eddie Candle M.D.   On: 10/12/2019 14:31   MR THORACIC SPINE WO CONTRAST  Result Date: 10/14/2019 CLINICAL DATA:  34 year old male with metastatic lung cancer. History of T3 pathologic fracture and multilevel thoracic ventral epidural tumor in May.  Back pain. EXAM: MRI THORACIC SPINE WITHOUT CONTRAST TECHNIQUE: Multiplanar, multisequence MR imaging of the thoracic spine was performed. No intravenous contrast was administered. COMPARISON:  Thoracic MRI 06/17/2019. FINDINGS: The examination had to be discontinued prior to completion due to pain. Only one set of motion degraded axial T2 weighted images could be obtained. Limited cervical spine imaging: Subtotal involvement of the C7 vertebra by tumor. Preserved cervicothoracic junction alignment. Thoracic spine segmentation: Appears to be normal, same numbering system used in May. Alignment:  Stable since May. Vertebrae: Diffuse thoracic bone metastases, with subtotal to complete vertebral infiltration redemonstrated at T1, T3, T6. Multilevel posterior element involvement, worst at T5 as before. Stable  T3 vertebral body pathologic compression fracture. No new pathologic fracture identified in the thoracic spine. No areas of progressed marrow edema since May. Cord: No thoracic spinal cord edema has developed since May. Stable noncontrast appearance of the cord. Conus medullaris occurs below T12-L1. Paraspinal and other soft tissues: Small right pleural effusion has increased. Otherwise grossly stable visible thoracic and upper abdominal viscera. Thoracic paraspinal soft tissues have not significantly changed, including some bilateral erector spinae muscle edema related to posterior element tumor involvement. Disc levels: Motion degraded axial images today such that epidural extension of tumor and mild spinal stenosis can only be discerned from sagittal sequences. Within that limitation, the epidural tumor extension and mild spinal stenosis at T3, T5, T6, T7, and T9 appears stable since May. New mild ventral epidural tumor is suspected at T11 now, with new mild spinal stenosis there. Epidural tumor involving the bilateral T5 neural foramina has increased, but is stable at the right T6 neural foramen. IMPRESSION: 1.  Largely stable diffuse thoracic spine metastases since the noncontrast MRI in May. 2. New mild epidural tumor and mild spinal stenosis at the T11 level. Increased epidural tumor in the bilateral T5 neural foramina, stable at the right T6 foramen. Stable epidural extension and mild spinal stenosis at T3, T5, T6, T7, and T9. No associated spinal cord edema. 3. Stable T3 pathologic fracture.  No new pathologic fracture. 4. Layering right pleural effusion appears increased. Electronically Signed   By: Genevie Ann M.D.   On: 10/14/2019 15:57   MR LUMBAR SPINE WO CONTRAST  Result Date: 10/14/2019 CLINICAL DATA:  34 year old male with metastatic lung cancer. History of T3 pathologic fracture and multilevel thoracic ventral epidural tumor in May. Back pain. EXAM: MRI LUMBAR SPINE WITHOUT CONTRAST TECHNIQUE: Multiplanar, multisequence MR imaging of the lumbar spine was performed. No intravenous contrast was administered. COMPARISON:  Thoracic spine MRI today reported separately. Lumbar MRI 06/17/2019. FINDINGS: Segmentation: Normal, concordant with the thoracic spine numbering today. Alignment:  Stable since May. Vertebrae: Diffuse lumbar vertebral metastatic disease again noted. Near complete tumor infiltration of the L2 vertebra as before. Increased L3 vertebral body tumor since May. Extensive left L1 and right L4 vertebral tumor appears stable. L5 involvement appears stable. Diffuse tumor infiltration of the left sacral ala and central lower sacral segments appears stable. Mild pathologic fracture of the L2 superior endplate is stable. No new pathologic fracture. Conus medullaris and cauda equina: Conus extends to the L1 level. No signal abnormality in the visible lower thoracic cord or conus. Paraspinal and other soft tissues: Stable since May. Negative visible abdominal viscera. Mild lumbar paraspinal muscle edema related to tumor. Disc levels: New ventral epidural tumor at both L1 and L2, with mild new spinal stenosis  at both levels. No foraminal tumor at this time. Possible early right ventral epidural tumor now at L3, although new spinal stenosis there appears more related to increased epidural lipomatosis. Mildly increased epidural lipomatosis and associated spinal stenosis also at L4 and L5. Superimposed lumbar spine degeneration, maximal at L3-L4, is stable. IMPRESSION: 1. Diffuse lumbosacral metastatic disease re-demonstrated. Stable mild L2 pathologic fracture. No new pathologic fracture. 2. Increased tumor infiltration of the L3 vertebra since May. Mild new ventral epidural tumor at L1, L2, and probably also L3. New mild spinal stenosis at those levels. 3. Increased lumbar spinal stenosis elsewhere related to interval increased epidural lipomatosis. Electronically Signed   By: Genevie Ann M.D.   On: 10/14/2019 16:06   CT ABDOMEN PELVIS W CONTRAST  Result Date: 10/19/2019 CLINICAL DATA:  Lower abdominal pain EXAM: CT ABDOMEN AND PELVIS WITH CONTRAST TECHNIQUE: Multidetector CT imaging of the abdomen and pelvis was performed using the standard protocol following bolus administration of intravenous contrast. CONTRAST:  168mL OMNIPAQUE IOHEXOL 300 MG/ML  SOLN COMPARISON:  CT from 10/12/2019, plain film from earlier in the same day. FINDINGS: Lower chest: Bilateral pleural effusions are noted right greater than left. Associated basilar atelectatic changes are noted. Hepatobiliary: Liver demonstrates multiple ring-enhancing lesions consistent with metastatic disease similar to that noted on the prior exam. Gallbladder is within normal limits. Fatty infiltration of the liver is seen as well. Pancreas: Unremarkable. No pancreatic ductal dilatation or surrounding inflammatory changes. Spleen: Normal in size without focal abnormality. Adrenals/Urinary Tract: Adrenal glands are within normal limits. Kidneys demonstrate a normal enhancement pattern bilaterally. No renal calculi are noted. Small cyst is noted within the right kidney.  Bladder is within normal limits. Stomach/Bowel: The appendix is within normal limits. No obstructive or inflammatory changes of the colon are noted. Stomach is decompressed. Small bowel is within normal limits. Vascular/Lymphatic: No significant vascular findings are present. No enlarged abdominal or pelvic lymph nodes. Reproductive: Prostate is unremarkable. Other: No abdominal wall hernia or abnormality. No abdominopelvic ascites. Musculoskeletal: Sclerotic foci are noted throughout the visualized bony structures consistent with metastatic disease. This is stable from the prior exam. IMPRESSION: Stable hepatic and bony metastatic disease. Fatty liver. No other focal abnormality is noted. Electronically Signed   By: Inez Catalina M.D.   On: 10/19/2019 22:58   CT Abdomen Pelvis W Contrast  Result Date: 10/12/2019 CLINICAL DATA:  Abdominal pain EXAM: CT ABDOMEN AND PELVIS WITH CONTRAST TECHNIQUE: Multidetector CT imaging of the abdomen and pelvis was performed using the standard protocol following bolus administration of intravenous contrast. CONTRAST:  152mL OMNIPAQUE IOHEXOL 350 MG/ML SOLN COMPARISON:  09/12/2019 FINDINGS: Lower chest: Please see separately dictated CT angiogram of the chest. Hepatobiliary: Hepatic steatosis. Numerous hypodense, rim enhancing lesions throughout the liver parenchyma, not significantly changed compared to recent prior staging CT. No gallstones, gallbladder wall thickening, or biliary dilatation. Pancreas: Unremarkable. No pancreatic ductal dilatation or surrounding inflammatory changes. Spleen: Normal in size without significant abnormality. Adrenals/Urinary Tract: Adrenal glands are unremarkable. Kidneys are normal, without renal calculi, solid lesion, or hydronephrosis. Bladder is unremarkable. Stomach/Bowel: Stomach is within normal limits. Appendix appears normal. The colon is generally decompressed, however there is some suggestion of inflammatory wall thickening,  particularly in the transverse colon (series 3, image 46). Vascular/Lymphatic: No significant vascular findings are present. Unchanged enlarged celiac axis lymph node measuring 1.0 x 0.8 cm (series 3, image 26). Reproductive: No mass or other significant abnormality. Other: No abdominal wall hernia or abnormality. No abdominopelvic ascites. Musculoskeletal: Numerous sclerotic osseous lesions, not significantly changed compared to prior examination. IMPRESSION: 1. The colon is generally decompressed, however there is some suggestion of inflammatory wall thickening, particularly in the transverse colon. Findings are suggestive of nonspecific infectious or inflammatory colitis. 2. No significant change in hepatic and osseous metastatic disease compared to recent staging CT. 3. Hepatic steatosis. 4. Please see separately dictated CT examination of the chest. Electronically Signed   By: Eddie Candle M.D.   On: 10/12/2019 14:20   DG CHEST PORT 1 VIEW  Result Date: 10/19/2019 CLINICAL DATA:  Pleuritic chest pain. History of metastatic lung cancer. EXAM: PORTABLE CHEST 1 VIEW COMPARISON:  October 12, 2019. FINDINGS: Stable position of right internal jugular Port-A-Cath. Increased right paratracheal density is noted concerning for  worsening adenopathy or metastatic disease. Left lung is clear. No pneumothorax or pleural effusion is noted. Bony thorax is unremarkable. IMPRESSION: Increased right paratracheal density is noted concerning for worsening adenopathy or metastatic disease. Electronically Signed   By: Marijo Conception M.D.   On: 10/19/2019 13:52   DG Abd Portable 1V  Result Date: 10/19/2019 CLINICAL DATA:  Constipation. EXAM: PORTABLE ABDOMEN - 1 VIEW COMPARISON:  March 28, 2010. FINDINGS: The bowel gas pattern is normal. No significant stool burden is noted. No radio-opaque calculi or other significant radiographic abnormality are seen. IMPRESSION: Negative. Electronically Signed   By: Marijo Conception M.D.    On: 10/19/2019 13:53   VAS [place] LOWER EXTREMITY VENOUS (DVT)  Result Date: 10/13/2019  Lower Venous DVTStudy Indications: Swelling.  Anticoagulation: Eliquis. Limitations: Unable to tolerate compression- left leg. Comparison Study: 06/04/19 previous Performing Technologist: Abram Sander RVS  Examination Guidelines: A complete evaluation includes B-mode imaging, spectral Doppler, color Doppler, and power Doppler as needed of all accessible portions of each vessel. Bilateral testing is considered an integral part of a complete examination. Limited examinations for reoccurring indications may be performed as noted. The reflux portion of the exam is performed with the patient in reverse Trendelenburg.  +---------+---------------+---------+-----------+----------+--------------+ RIGHT    CompressibilityPhasicitySpontaneityPropertiesThrombus Aging +---------+---------------+---------+-----------+----------+--------------+ CFV      Full           Yes      Yes                                 +---------+---------------+---------+-----------+----------+--------------+ SFJ      Full                                                        +---------+---------------+---------+-----------+----------+--------------+ FV Prox  Full                                                        +---------+---------------+---------+-----------+----------+--------------+ FV Mid   Full                                                        +---------+---------------+---------+-----------+----------+--------------+ FV DistalFull                                                        +---------+---------------+---------+-----------+----------+--------------+ PFV      Full                                                        +---------+---------------+---------+-----------+----------+--------------+ POP      Full  Yes      Yes                                  +---------+---------------+---------+-----------+----------+--------------+ PTV      Full                                                        +---------+---------------+---------+-----------+----------+--------------+ PERO                                                  Not visualized +---------+---------------+---------+-----------+----------+--------------+   +---------+---------------+---------+-----------+----------+--------------+ LEFT     CompressibilityPhasicitySpontaneityPropertiesThrombus Aging +---------+---------------+---------+-----------+----------+--------------+ CFV      Full           Yes      Yes                                 +---------+---------------+---------+-----------+----------+--------------+ SFJ      Full                                                        +---------+---------------+---------+-----------+----------+--------------+ FV Prox                 Yes      Yes                                 +---------+---------------+---------+-----------+----------+--------------+ FV Mid                  Yes      Yes                                 +---------+---------------+---------+-----------+----------+--------------+ FV Distal               Yes      Yes                                 +---------+---------------+---------+-----------+----------+--------------+ PFV                     Yes      Yes                                 +---------+---------------+---------+-----------+----------+--------------+ POP      Full           Yes      Yes                                 +---------+---------------+---------+-----------+----------+--------------+ PTV      Full                                                        +---------+---------------+---------+-----------+----------+--------------+  PERO                                                  Not visualized  +---------+---------------+---------+-----------+----------+--------------+     Summary: BILATERAL: - No evidence of deep vein thrombosis seen in the lower extremities, bilaterally. - No evidence of superficial venous thrombosis in the lower extremities, bilaterally. -  LEFT: - Portions of this examination were limited- see technologist comments above.  *See table(s) above for measurements and observations. Electronically signed by Servando Snare MD on 10/13/2019 at 3:39:36 PM.    Final     Microbiology: No results found for this or any previous visit (from the past 240 hour(s)).   Labs: Basic Metabolic Panel: Recent Labs  Lab 10/23/19 0455 10/23/19 0455 10/24/19 0320 10/25/19 0530 10/26/19 0450 10/27/19 0441 10/28/19 0439  NA 141  --  145 145 144 145  --   K 3.7  --  3.5 3.4* 3.3* 3.8  --   CL 106  --  110 109 109 110  --   CO2 24  --  25 26 25 25   --   GLUCOSE 87  --  107* 99 122* 131*  --   BUN 9  --  12 12 12 15   --   CREATININE 0.91  --  0.89 0.86 0.82 0.88  --   CALCIUM 8.3*  --  8.3* 8.4* 8.3* 8.4*  --   MG 1.7   < > 1.8 1.8 1.8 1.8 1.8   < > = values in this interval not displayed.   Liver Function Tests: Recent Labs  Lab 10/23/19 0455 10/24/19 0320 10/25/19 0530 10/26/19 0450 10/27/19 0441  AST 121* 118* 116* 91* 91*  ALT 96* 103* 101* 96* 106*  ALKPHOS 364* 345* 366* 344* 353*  BILITOT 0.8 0.5 0.5 0.5 0.5  PROT 6.2* 6.1* 6.1* 5.9* 5.9*  ALBUMIN 2.8* 2.8* 2.8* 2.8* 2.7*   No results for input(s): LIPASE, AMYLASE in the last 168 hours. Recent Labs  Lab 10/22/19 1236  AMMONIA 23   CBC: Recent Labs  Lab 10/24/19 0320 10/25/19 0530 10/26/19 0450 10/27/19 0441 10/28/19 0439  WBC 7.1 7.9 7.6 6.4 6.9  NEUTROABS 5.4 6.0 6.0 5.3 5.9  HGB 8.6* 8.8* 8.4* 8.7* 9.1*  HCT 28.9* 29.0* 28.0* 29.0* 29.7*  MCV 95.1 93.2 93.6 93.9 94.0  PLT 112* 122* 145* 162 188   Cardiac Enzymes: No results for input(s): CKTOTAL, CKMB, CKMBINDEX, TROPONINI in the last 168  hours. BNP: BNP (last 3 results) No results for input(s): BNP in the last 8760 hours.  ProBNP (last 3 results) No results for input(s): PROBNP in the last 8760 hours.  CBG: No results for input(s): GLUCAP in the last 168 hours.     Signed:  Annita Brod, MD [facility] 10/28/2019, 1:40 PM

## 2019-10-28 NOTE — Progress Notes (Signed)
AVS given to patient and explained at the bedside. Medications and follow up appointments have been explained with pt verbalizing understanding.  

## 2019-10-28 NOTE — Progress Notes (Signed)
Witnessed Fentanyl 150 mcg/3 mls waste in steri-cycle with Lennox Laity, RN (primary RN).SRP, RN

## 2019-10-30 ENCOUNTER — Ambulatory Visit
Admission: RE | Admit: 2019-10-30 | Discharge: 2019-10-30 | Disposition: A | Discharge status: Home / Self Care | Payer: Medicaid Other | Source: Ambulatory Visit | Attending: Radiation Oncology | Admitting: Radiation Oncology

## 2019-10-30 ENCOUNTER — Telehealth: Payer: Self-pay

## 2019-10-30 DIAGNOSIS — C7951 Secondary malignant neoplasm of bone: Secondary | ICD-10-CM | POA: Diagnosis present

## 2019-10-30 DIAGNOSIS — Z510 Encounter for antineoplastic radiation therapy: Secondary | ICD-10-CM | POA: Diagnosis not present

## 2019-10-30 DIAGNOSIS — C7931 Secondary malignant neoplasm of brain: Secondary | ICD-10-CM | POA: Diagnosis present

## 2019-10-30 DIAGNOSIS — C3411 Malignant neoplasm of upper lobe, right bronchus or lung: Secondary | ICD-10-CM | POA: Diagnosis not present

## 2019-10-30 NOTE — Telephone Encounter (Signed)
Transition Care Management Follow-up Telephone Call  Call completed with patient and his wife, Cindra Eves  Date of discharge and from where: 10/28/2019, [facility]   How have you been since you were released from the hospital? He stated he is feeling okay.  Any questions or concerns?   They have questions about the medications and possible interactions between newly prescribed and old medications.  They would like to discuss with Dr Joya Gaskins.  Provided them with the address of [facility] for tomorrow because Dr Joya Gaskins will be there. They said they would plan to go sometime before/after his radiation treatment.   Items Reviewed:  Did the pt receive and understand the discharge instructions provided?  yes  Medications obtained and verified?  he said that his wife manages his medications.  Please see above note regarding desire to speak with Dr Joya Gaskins.   Any new allergies since your discharge?   None reported   Do you have support at home?  yes, his wife.  No home health or DME ordered  Functional Questionnaire: (I = Independent and D = Dependent) ADLs: wife assists as needed. He has a cane to use with ambulation. Follow up appointments reviewed:   PCP Hospital f/u appt confirmed? .he plans to see Dr Joya Gaskins at the mobile medical unit tomorrow - 10/31/2019  Specialist Hospital f/u appt confirmed? he has multiple appointments scheduled for rad/onc  Are transportation arrangements needed?  his wife drives  If their condition worsens, is the pt aware to call PCP or go to the Emergency Dept.? yes  Was the patient provided with contact information for the PCP's office or ED? they have the phone number for [facility]  Was to pt encouraged to call back with questions or concerns?   yes

## 2019-10-31 ENCOUNTER — Other Ambulatory Visit: Payer: Self-pay

## 2019-10-31 ENCOUNTER — Other Ambulatory Visit: Payer: Self-pay | Admitting: Critical Care Medicine

## 2019-10-31 ENCOUNTER — Telehealth: Payer: Self-pay | Admitting: Radiation Oncology

## 2019-10-31 ENCOUNTER — Ambulatory Visit
Admission: RE | Admit: 2019-10-31 | Discharge: 2019-10-31 | Disposition: A | Discharge status: Home / Self Care | Payer: Medicaid Other | Source: Ambulatory Visit | Attending: Radiation Oncology | Admitting: Radiation Oncology

## 2019-10-31 ENCOUNTER — Ambulatory Visit: Payer: PRIVATE HEALTH INSURANCE | Admitting: Critical Care Medicine

## 2019-10-31 VITALS — BP 158/98 | HR 107 | Temp 98.7°F | Resp 18 | Ht 67.0 in | Wt 204.0 lb

## 2019-10-31 DIAGNOSIS — Z515 Encounter for palliative care: Secondary | ICD-10-CM

## 2019-10-31 DIAGNOSIS — R000 Tachycardia, unspecified: Secondary | ICD-10-CM

## 2019-10-31 DIAGNOSIS — C7931 Secondary malignant neoplasm of brain: Secondary | ICD-10-CM

## 2019-10-31 DIAGNOSIS — R9431 Abnormal electrocardiogram [ECG] [EKG]: Secondary | ICD-10-CM

## 2019-10-31 DIAGNOSIS — Z510 Encounter for antineoplastic radiation therapy: Secondary | ICD-10-CM | POA: Diagnosis not present

## 2019-10-31 DIAGNOSIS — I10 Essential (primary) hypertension: Secondary | ICD-10-CM

## 2019-10-31 DIAGNOSIS — E876 Hypokalemia: Secondary | ICD-10-CM

## 2019-10-31 DIAGNOSIS — C3491 Malignant neoplasm of unspecified part of right bronchus or lung: Secondary | ICD-10-CM

## 2019-10-31 DIAGNOSIS — I2694 Multiple subsegmental pulmonary emboli without acute cor pulmonale: Secondary | ICD-10-CM

## 2019-10-31 MED ORDER — CELECOXIB 100 MG PO CAPS: 100.0000 mg | ORAL_CAPSULE | Freq: Two times a day (BID) | ORAL | 1 refills | Status: DC

## 2019-10-31 MED ORDER — CELECOXIB 100 MG PO CAPS
100.0000 mg | ORAL_CAPSULE | Freq: Two times a day (BID) | ORAL | 1 refills | Status: DC
Start: 2019-10-31 — End: 2019-10-31

## 2019-10-31 MED ORDER — FENTANYL 50 MCG/HR TD PT72: 2.0000 | MEDICATED_PATCH | TRANSDERMAL | 0 refills | Status: DC

## 2019-10-31 NOTE — Telephone Encounter (Signed)
Pharmacy is closed at [facility]. Pt. Request to resend it to [facility] on [address].

## 2019-10-31 NOTE — Telephone Encounter (Signed)
I spoke with the patient today as well as his wife and he is still having a really difficult time with his pain relief. He has been having persistent shooting pain and low back pain into the leg and knee. His fenantyl patch and oxycodone dosing has not helped much. I was able to reach out to Dr. Hilma Favors and she suggested changing his fentanyl patch to every other day rather than every 3 days. They will make this adjustment.  When I called the patient's home back his wife was the only one on the phone and we discussed her concerns. I let her know that I was aware of the patient's having episodes of hallucinations, lower cognitive interaction with others, repeatedly asking his wife the same thing during his hospitalization and apparently episodes of forgetting to push his pca when he was in pain and meds had been significantly reduced. At home he's been having a lot of emotional lows and his wife feels he is depressed, not interested in doing much, and despite her trying to open up to him, he has been less interested in this. I let her know that while Dr. Hilma Favors is excellent at symptom management, she is also equally excellent at helping people navigate their emotions as a symptom of their cancer and that there may be some resources to help him that he's not been aware of previously. I encouraged his wife to think about how we could help her and their children as well. She is very open to these conversations, at times references scenarios of him having memories with his children to look back on with him when he's not there, but quickly corrected herself by saying IF he's not there one day. I will let Dr. Lorenso Courier and Dr. Hilma Favors know our conversation so hopefully this can open up his willingness to consider help for navigating such a terrible situation at such a young age, even if he plans to continue with treatment. 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 I'll reach out to the brain oncology navigator to move up his MRI, and the patient's wife was in agreement.

## 2019-10-31 NOTE — Progress Notes (Signed)
Subjective:    Patient ID: William Ali, male    DOB: August 13, 1985, 34 y.o.   MRN: [id number]  08/23/19 This is a pleasant 34 year old male who comes to see me to establish for primary care and he is accompanied with his spouse.  This patient unfortunately developed a nodular density in March 2020 and another x-ray obtained in April 2020 recommending follow-up imaging with a nodule seen in the lung.  Subsequent to this the patient presented to the emergency room in mid April with headaches and left pelvic pain ultimately found to have a cavitary lesion in the right lung apex with spiculated margins and additional sites of disease including lymph nodes of the chest and numerous liver mets numerous multiple lytic appearing lesions throughout the thoracic and lumbar spine as well as portions of the bony pelvis.  Patient also had MRI of the brain showing numerous enhancing parenchymal intracranial lesions likely metastases.  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  The patient is now undergoing treatment and outlined below is the patient's treatment summary per oncology   #MetastaticAdenocarcinoma of the Lungwith Brain, [place]se[place],and Liver Metastasis 1)04/24/2018: Chest radiograph shows a 1.7 cm nodular density. Short term f/u of this lesion was recommended. CXR on 05/01/2018 performed with recommended f/u imaging. 2) 05/10/2019: presented to the ED with headache and left pelvis pain. Patient underwent CT C/A/P and MRI brain. Brain MRI showed numerous enhancing parenchymal intracranial lesions likely reflecting metastases. CT scan showedcavitary lesion in therightlung apex with spiculated margins. Additional sites of disease include lymph nodes of the chest and numerous liver mets. In the bones there weremultiple lytic appearing lesions throughout the thoracic and lumbar spine as well as portions of the bony pelvis. Referred to Oncology. 3)  05/16/2019: US biopsy of the liver performed. Findings consistent with a poorly differentiated adenocarcinoma.  4) 05/19/2019: establish care with Dr. Lorenso Courier.  5) 05/30/2019: CT simulation. Received Radiation to brain/spine.  5) 06/23/2019: Received Cycle 1 Day 1 of Carbo/Pemetrexed while inpatient. Pembrolizumab not able to be administered in inpatient setting. 6) 07/13/2019:Cycle 2 Day 1 of Carboplatin/Pemetrexed/Pembrolizumab 7)  08/04/2019:Cycle 3 day 1 of Carboplatin/Pemetrexed/Pembrolizumab  Note this patient now is starting cycle 4 of his chemotherapy and has finished his brain and spine radiation therapy.  But the patient does have access to Dilaudid as needed for pain and now also has a fentanyl patch which is helped to some degree.  He states his breathing is at a stable situation for now.  His wife states he has some noises he makes when he breathes at night.  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  The patient is currently receiving his medications retail at the [facility] pain extremely high price.  We will attempt to get his Xarelto and fentanyl patches at least at a reduced amount at the [facility].  Repeat scans are due to be obtained in August as noted.  Patient has a physical therapist coming to his home as well.  Note patient is on Decadron and antiemetics as well.  The patient is a full code at this time and is not yet in any type of hospice care but is interested in appropriately receiving aggressive treatment for his condition though he does realize it is a palliative approach as per oncology's recent note Note there is a history of pulmonary embolism which is why he is on the  Xarelto due to hypercoagulable state.  Also note the patient is being managed by radiation therapy, medical oncology and neuro-oncology.  The patient's weight loss is improved and he is taking a nutritional supplements at this  time.   10/31/2019 Seen at mobile unit for post hospital follow-up This patient unfortunately has metastatic adenocarcinoma of the lung and now has dense spinal involvement in thoracic and lumbar spine and is undergoing now radiation therapy.  He just finished his fifth treatment of the planned 15 course of treatment.  Patient just got out of the hospital when he was admitted for sepsis and delirium secondary to adverse side effects from fentanyl and Dilaudid combination.  Below is the discharge summary  Dc summary: PCP: Elsie Stain, MD  Admit date: 10/12/2019 Discharge date: 10/28/2019  Time spent: 35 minutes  Recommendations for Outpatient Follow-up:  1. Medication change: Duragesic patches 75 mcg every 72 hours discontinued and changed to 50 mcg every 72 hours 2. Medication change: Protonix 40 mg twice daily change from as needed to scheduled 3. New medication: Motrin 800 mg twice daily scheduled 4. Oxycodone 10 mg twice daily as needed for breakthrough pain 5. New medication: Magnesium 800 mg p.o. daily 6. New medication: MiraLAX as needed 7. Medication change: Decadron increased from 0.5 mg to 2 mg daily 8. New medication: Xanax 1 mg as needed for anxiety every 4 hours 9. Patient will continue with radiation oncology for spine metastases and pain control 10. We will coordinate with oncology to receive Zometa after discharge.dental exam is recommended prior to administration but shouldn't delay him receiving this at this point unless there is obvious dental erosions and untreated caries.  Discharge Diagnoses:  Active Hospital Problems  Diagnosis Date Noted .  Severe sepsis ([place]) 10/12/2019 . Chest pain  . Generalized abdominal pain  . Prolonged Q-T interval on ECG 10/14/2019 . Hypomagnesemia 10/14/2019 . Cancer related pain 10/14/2019 . Tachycardia 10/12/2019 . Colitis 10/12/2019 . Hypokalemia 10/12/2019 . Adenocarcinoma of lung ([place]) 05/21/2019 . Metastatic  adenocarcinoma to brain ([place]) 05/21/2019 . Goals of care, counseling/discussion 05/21/2019 . Essential hypertension 08/17/2018  Resolved Hospital Problems No resolved problems to display.   Discharge Condition: Improved although long-term prognosis is limited  Diet recommendation: Regular diet with Ensure twice daily between meals  Hospital Course:  Principal Problem: Severe sepsis ([place]) Active Problems:   Goals of care, counseling/discussion   Metastatic adenocarcinoma to brain [facility])   Adenocarcinoma of lung ([place])   Essential hypertension   Tachycardia   Colitis   Hypokalemia   Prolonged Q-T interval on ECG   Hypomagnesemia   Cancer related pain   Chest pain   Generalized abdominal pain Acute on chronic cancer-related back pain. Myoclonic jerks. Visual and auditory hallucination. Tremors. Continues to have intractable back pain, appears to be much improved. Greatly appreciate palliative care assistance with management.  Initially started on fentanyl patch, but started having myoclonic jerking as well as severe hallucinations.  Transition to fentanyl PCA which he seemed to do somewhat better with.  Palliative care intervened.  By tracking PCA usage, noted only have 2 bolus doses over the course of 24 hours.  Patient transitioned from PCA to a transdermal patch.  By 10/1, patient much improved and patient made following recommendations of being on Duragesic 50 mcg patch every 72 hours.  Oxycodone 10 mg twice a day for breakthrough pain.  Continuing Senokot and adding as needed MiraLAX.  Daily supplemental magnesium.  Increasing Decadron from 0.5 to 2 mg daily.  Has been responding well to Toradol so changed over to ibuprofen 800 mg twice a day and changing his as needed PPI to scheduled twice a day.  Initial recommendation was for Celebrex, however even with insurance approval, this medications $175 for every month.  Patient will continue with radiation for spine metastases and  pain control.  He will need to receive Zometa after discharge.  Severe sepsis, present on admission secondary to acute transverse colitis. With tachycardia tachypnea and lactic acidosis meeting severe sepsis criteria. Treated with IV fluids.Sepsis felt to be resolved. Also treated with IV broad-spectrum antibiotics and vancomycin 122m po QID x10 days due to +toxigenic CDiff PCR.  Antibiotics discontinued after 10-day course and infection felt to be resolved.  History of DVT and PE. - Restarted xarelto, continued upon discharge.  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 - Follow up with oncology, Dr. DLorenso Courier - Follow up with Rad Onc  Chemotherapy-induced pancytopenia.Platelets significantly lower along with rest of the blood count. Currently Dr. DLorenso Courierfrom hematology okay with anticoagulation as long as platelets remain above 30.  On day of discharge, platelet count at 188. - Continue xarelto for hx DVT/PE, malignancy  Hypomagnesemia - Replaced.  Started on mag oxide during his hospitalization, continue as outpatient indefinitely.  Obesity: Patient meets criteria with body mass index is 33.05 kg/m.  Delirium - Minimize polypharmacy.  Appreciate palliative care help  LFT elevation.Likely in response to liver involvement with metastasis. Mentation ok.   Consultants:  Oncology  Rad Onc  Palliative care  Since discharge the patient has developed increased pain in the back.  Upon review of the MRI of the lumbar spine there is epidural involvement with nerve roots.  The patient is on low-dose duloxetine however was not able to achieve the Celebrex prescription.  He cannot receive Toradol because of interactions with Xarelto.  Patient is also on the Xarelto for chronic recurrent DVT and pulmonary emboli.  The patient's not able to achieve outpatient palliative care in the home.  Dr. BRhea Pinkof the inpatient hospitalist service has a  clinic in the cancer center and I engaged with her at this visit she indicated she would see the patient back in return follow-up in 1 day at the cancer center.  Patient denies any dyspnea or cough.  He states on the fentanyl patch 50 mcg every 3 days and OxyContin  10 mg twice daily there is simply not enough pain management.  Again the patient cannot receive Celebrex as the co-pay was too high  He had been recommended to receive Lyrica or gabapentin but no prescription was written from the hospital  The transition of care note from our clinic RN was reviewed  . Past Medical History:  Diagnosis Date  . Back pain   . Hypertension   . met lung ca to liver, spine, and brain dx'd 03/2019     Family History  Problem Relation Age of Onset  . Hypertension Mother   . Diabetes Father      Social History   Socioeconomic History  . Marital status: Married    Spouse name: Not on file  . Number of children: Not on file  . Years of education: Not on file  . Highest education level: Not on file  Occupational History  . Not on file  Tobacco Use  . Smoking status: Never Smoker  . Smokeless tobacco: Never Used  Vaping Use  . Vaping Use: Never used  Substance and Sexual  Activity  . Alcohol use: No  . Drug use: No  . Sexual activity: Yes  Other Topics Concern  . Not on file  Social History Narrative  . Not on file   Social Determinants of Health   Financial Resource Strain:   . Difficulty of Paying Living Expenses: Not on file  Food Insecurity:   . Worried About Charity fundraiser in the Last Year: Not on file  . Ran Out of Food in the Last Year: Not on file  Transportation Needs:   . Lack of Transportation (Medical): Not on file  . Lack of Transportation (Non-Medical): Not on file  Physical Activity:   . Days of Exercise per Week: Not on file  . Minutes of Exercise per Session: Not on file  Stress:   . Feeling of Stress : Not on file  Social Connections:   . Frequency of  Communication with Friends and Family: Not on file  . Frequency of Social Gatherings with Friends and Family: Not on file  . Attends Religious Services: Not on file  . Active Member of Clubs or Organizations: Not on file  . Attends Archivist Meetings: Not on file  . Marital Status: Not on file  Intimate Partner Violence:   . Fear of Current or Ex-Partner: Not on file  . Emotionally Abused: Not on file  . Physically Abused: Not on file  . Sexually Abused: Not on file     Allergies  Allergen Reactions  . Onion Anaphylaxis  . Other     All sea food causes swelling and a rash.    . Peanut Oil Itching  . Peanut-Containing Drug Products Itching     Outpatient Medications Prior to Visit  Medication Sig Dispense Refill  . albuterol (VENTOLIN HFA) 108 (90 Base) MCG/ACT inhaler TAKE 2 PUFFS BY MOUTH EVERY 6 HOURS AS NEEDED FOR WHEEZE OR SHORTNESS OF BREATH (Patient taking differently: Inhale 2 puffs into the lungs every 6 (six) hours as needed for wheezing or shortness of breath. ) 6.7 g 1  . ALPRAZolam (XANAX) 1 MG tablet Take 1 tablet (1 mg total) by mouth every 4 (four) hours as needed for anxiety. 30 tablet 0  . dexamethasone (DECADRON) 2 MG tablet Take 1 tablet (2 mg total) by mouth daily. 30 tablet 3  . DULoxetine (CYMBALTA) 30 MG capsule Take 1 capsule (30 mg total) by mouth daily. 30 capsule 3  . feeding supplement, ENSURE ENLIVE, (ENSURE ENLIVE) LIQD Take 237 mLs by mouth 2 (two) times daily between meals. 194 mL 12  . folic acid (FOLVITE) 1 MG tablet Take 1 tablet (1 mg total) by mouth daily. 60 tablet 3  . ibuprofen (IBU) 800 MG tablet Take 1 tablet (800 mg total) by mouth in the morning and at bedtime. 30 tablet 0  . lidocaine-prilocaine (EMLA) cream Apply 1 application topically as needed. (Patient taking differently: Apply 1 application topically as needed (access port). ) 30 g 2  . magnesium oxide (MAG-OX) 400 (241.3 Mg) MG tablet Take 2 tablets (800 mg total) by mouth  daily. 30 tablet 3  . metoprolol succinate (TOPROL XL) 50 MG 24 hr tablet Take 1 tablet (50 mg total) by mouth daily. Take with or immediately following a meal. 30 tablet 11  . ondansetron (ZOFRAN) 4 MG tablet Take 1 tablet (4 mg total) by mouth every 8 (eight) hours as needed for nausea or vomiting. 60 tablet 0  . oxyCODONE 10 MG TABS Take 1 tablet (  10 mg total) by mouth every 12 (twelve) hours as needed for breakthrough pain. 60 tablet 0  . pantoprazole (PROTONIX) 40 MG tablet Take 1 tablet (40 mg total) by mouth 2 (two) times daily. 60 tablet 3  . polyethylene glycol (MIRALAX / GLYCOLAX) 17 g packet Take 17 g by mouth daily as needed for moderate constipation. 14 each 0  . polyvinyl alcohol (LIQUIFILM TEARS) 1.4 % ophthalmic solution Place 1 drop into both eyes as needed for dry eyes. 15 mL 0  . rivaroxaban (XARELTO) 20 MG TABS tablet Take 1 tablet (20 mg total) by mouth daily with supper. 90 tablet 1  . senna-docusate (SENOKOT-S) 8.6-50 MG tablet Take 2 tablets by mouth at bedtime.    . fentaNYL (DURAGESIC) 50 MCG/HR Place 1 patch onto the skin every 3 (three) days. 5 patch 0  . fentaNYL (DURAGESIC) 50 MCG/HR Place 1 patch onto the skin every 3 (three) days. 5 patch 0  . LORazepam (ATIVAN) 1 MG tablet Take 1 tablet (1 mg total) by mouth as directed. Please take 30 to 60 minutes prior to CT scans. (Patient not taking: Reported on 10/31/2019) 1 tablet 0  . magnesium oxide (MAG-OX) 400 MG tablet Take 2 tablets by mouth daily.     No facility-administered medications prior to visit.     Review of Systems  Constitutional: Positive for activity change, appetite change, fatigue and unexpected weight change.  HENT: Negative for congestion, dental problem, drooling, ear discharge, facial swelling, hearing loss, nosebleeds, postnasal drip, rhinorrhea, sinus pressure, sinus pain, sore throat and voice change.   Eyes: Positive for visual disturbance.  Respiratory: Positive for cough and shortness of  breath.   Cardiovascular: Positive for chest pain.  Gastrointestinal: Positive for constipation, nausea and vomiting.  Endocrine: Negative.   Genitourinary: Negative.   Musculoskeletal: Positive for back pain, joint swelling and neck pain.  Skin: Negative.   Neurological: Positive for dizziness, weakness and headaches. Negative for tremors, seizures, syncope, facial asymmetry, speech difficulty, light-headedness and numbness.  Hematological: Negative for adenopathy. Does not bruise/bleed easily.  Psychiatric/Behavioral: Positive for decreased concentration, dysphoric mood and sleep disturbance. Negative for behavioral problems, confusion, self-injury and suicidal ideas. The patient is nervous/anxious.        Objective:   Physical Exam Vitals:   10/31/19 1429  BP: (!) 158/98  Pulse: (!) 107  Resp: 18  Temp: 98.7 F (37.1 C)  TempSrc: Oral  SpO2: 98%  Weight: 204 lb (92.5 kg)  Height: 5' 7"  (1.702 m)    Gen: Depressed , well-nourished, in no distress,    ENT: No lesions,  mouth clear,  oropharynx clear, no postnasal drip  Neck: No JVD, no TMG, no carotid bruits  Lungs: No use of accessory muscles, no dullness to percussion, distant breath sounds  Cardiovascular: RRR, heart sounds normal, no murmur or gallops, no peripheral edema  Abdomen: soft and NT, no HSM,  BS normal  Musculoskeletal: No deformities, no cyanosis or clubbing, significant tenderness in the lumbar area  Neuro: alert, non focal, persistent tremor right foot  Skin: Warm, no lesions or rashes  BMP Latest Ref Rng & Units 10/27/2019 10/26/2019 10/25/2019  Glucose 70 - 99 mg/dL 131(H) 122(H) 99  BUN 6 - 20 mg/dL 15 12 12   Creatinine 0.61 - 1.24 mg/dL 0.88 0.82 0.86  Sodium 135 - 145 mmol/L 145 144 145  Potassium 3.5 - 5.1 mmol/L 3.8 3.3(L) 3.4(L)  Chloride 98 - 111 mmol/L 110 109 109  CO2 22 - 32  mmol/L 25 25 26   Calcium 8.9 - 10.3 mg/dL 8.4(L) 8.3(L) 8.4(L)   Hepatic Function Latest Ref Rng & Units  10/27/2019 10/26/2019 10/25/2019  Total Protein 6.5 - 8.1 g/dL 5.9(L) 5.9(L) 6.1(L)  Albumin 3.5 - 5.0 g/dL 2.7(L) 2.8(L) 2.8(L)  AST 15 - 41 U/L 91(H) 91(H) 116(H)  ALT 0 - 44 U/L 106(H) 96(H) 101(H)  Alk Phosphatase 38 - 126 U/L 353(H) 344(H) 366(H)  Total Bilirubin 0.3 - 1.2 mg/dL 0.5 0.5 0.5  Bilirubin, Direct 0.0 - 0.2 mg/dL - - -   CBC Latest Ref Rng & Units 10/28/2019 10/27/2019 10/26/2019  WBC 4.0 - 10.5 K/uL 6.9 6.4 7.6  Hemoglobin 13.0 - 17.0 g/dL 9.1(L) 8.7(L) 8.4(L)  Hematocrit 39 - 52 % 29.7(L) 29.0(L) 28.0(L)  Platelets 150 - 400 K/uL 188 162 145(L)   All images and  link were reviewed      Assessment & Plan:  I personally reviewed all images and lab data in the [facility] system as well as any outside material available during this office visit and agree with the  radiology impressions.   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 Rhea Pink of palliative medicine she is indicating we may switch him to methadone she says she will own the management of this patient's pain control and will see him in the cancer clinic tomorrow on 6 October.  In the interim I will increase the fentanyl to 2 patches every 72 hours 50 mcg each per Dr. Delanna Ahmadi direction I also will attempt to achieve Celebrex twice daily using a patient assistance find is the patient cannot afford the cost of the Celebrex on the co-pay of his insurance  Metastatic adenocarcinoma to brain [facility]) Prior metastases to the brain status post radiotherapy  Palliative care  encounter Again this is a palliative medicine encounter the patient and his wife knows that the treatments here palliative only  Prolonged Q-T interval on ECG We will need to continue to monitor the QT interval on EKG  Hypomagnesemia Continue magnesium supplementation  Hypokalemia Resolved  Tachycardia Secondary to pain   Silviano was seen today for leg pain.  Diagnoses and all orders for this visit:  Essential hypertension  Multiple subsegmental pulmonary emboli without acute cor pulmonale (HCC)  Primary malignant neoplasm of right lung metastatic to other site [facility])  Metastatic adenocarcinoma to brain [facility])  Palliative care encounter  Prolonged Q-T interval on ECG  Hypomagnesemia  Hypokalemia  Tachycardia  Other orders -     fentaNYL (DURAGESIC) 50 MCG/HR; Place 2 patches onto the skin every other day. -     Discontinue: celecoxib (CELEBREX) 100 MG capsule; Take 1 capsule (100 mg total) by mouth 2 (two) times daily. -     Discontinue: celecoxib (CELEBREX) 100 MG capsule; Take 1 capsule (100 mg total) by mouth 2 (two) times daily.

## 2019-10-31 NOTE — Patient Instructions (Addendum)
Increase Fentanyl patch to two patches every other day  Start celebrex one twice daily: obtain at [place]  Dr Rhea Pink of palliative care will see you tomorrow at the cancer center during your radiation treatment and will manage your pain  Dr Joya Gaskins will do a telephone visit with you in two weeks

## 2019-10-31 NOTE — Progress Notes (Signed)
Patient has eaten and taken medication. Patient complains of stopping diludad two weeks ago and pain presenting a week after.

## 2019-11-01 ENCOUNTER — Other Ambulatory Visit: Payer: Self-pay

## 2019-11-01 ENCOUNTER — Telehealth: Payer: Self-pay

## 2019-11-01 ENCOUNTER — Telehealth: Payer: Self-pay | Admitting: Radiation Therapy

## 2019-11-01 ENCOUNTER — Other Ambulatory Visit: Payer: PRIVATE HEALTH INSURANCE

## 2019-11-01 ENCOUNTER — Ambulatory Visit: Payer: PRIVATE HEALTH INSURANCE

## 2019-11-01 ENCOUNTER — Ambulatory Visit: Payer: PRIVATE HEALTH INSURANCE | Admitting: Hematology and Oncology

## 2019-11-01 ENCOUNTER — Other Ambulatory Visit: Payer: Self-pay | Admitting: Radiation Therapy

## 2019-11-01 ENCOUNTER — Ambulatory Visit
Admission: RE | Admit: 2019-11-01 | Discharge: 2019-11-01 | Disposition: A | Discharge status: Home / Self Care | Payer: Medicaid Other | Source: Ambulatory Visit | Attending: Radiation Oncology | Admitting: Radiation Oncology

## 2019-11-01 ENCOUNTER — Inpatient Hospital Stay: Payer: Medicaid Other | Attending: Hematology and Oncology | Admitting: Internal Medicine

## 2019-11-01 DIAGNOSIS — C787 Secondary malignant neoplasm of liver and intrahepatic bile duct: Secondary | ICD-10-CM | POA: Insufficient documentation

## 2019-11-01 DIAGNOSIS — C3491 Malignant neoplasm of unspecified part of right bronchus or lung: Secondary | ICD-10-CM | POA: Insufficient documentation

## 2019-11-01 DIAGNOSIS — C7949 Secondary malignant neoplasm of other parts of nervous system: Secondary | ICD-10-CM | POA: Diagnosis not present

## 2019-11-01 DIAGNOSIS — C7931 Secondary malignant neoplasm of brain: Secondary | ICD-10-CM | POA: Insufficient documentation

## 2019-11-01 DIAGNOSIS — C7951 Secondary malignant neoplasm of bone: Secondary | ICD-10-CM | POA: Insufficient documentation

## 2019-11-01 DIAGNOSIS — G893 Neoplasm related pain (acute) (chronic): Secondary | ICD-10-CM | POA: Insufficient documentation

## 2019-11-01 DIAGNOSIS — Z510 Encounter for antineoplastic radiation therapy: Secondary | ICD-10-CM | POA: Diagnosis not present

## 2019-11-01 DIAGNOSIS — Z79899 Other long term (current) drug therapy: Secondary | ICD-10-CM | POA: Insufficient documentation

## 2019-11-01 MED ORDER — TRAZODONE HCL 100 MG PO TABS: 100.0000 mg | ORAL_TABLET | Freq: Every day | ORAL | 0 refills | Status: DC

## 2019-11-01 MED ORDER — FENTANYL 50 MCG/HR TD PT72: 1.0000 | MEDICATED_PATCH | TRANSDERMAL | 0 refills | Status: DC

## 2019-11-01 MED ORDER — OXYCODONE HCL 20 MG PO TABS: 20.0000 mg | ORAL_TABLET | Freq: Four times a day (QID) | ORAL | 0 refills | Status: AC | PRN

## 2019-11-01 MED FILL — CELECOXIB 100 MG CAP: 100 | 30 days supply | Qty: 60 | Fill #0

## 2019-11-01 NOTE — Assessment & Plan Note (Signed)
We will need to continue to monitor the QT interval on EKG

## 2019-11-01 NOTE — Assessment & Plan Note (Signed)
Again this is a palliative medicine encounter the patient and his wife knows that the treatments here palliative only

## 2019-11-01 NOTE — Assessment & Plan Note (Signed)
Hypertension not well controlled and will avoid further medication adjustments due to the fragile nature of the patient's cardiovascular status

## 2019-11-01 NOTE — Assessment & Plan Note (Signed)
Resolved

## 2019-11-01 NOTE — Assessment & Plan Note (Signed)
Secondary to pain.  °

## 2019-11-01 NOTE — Assessment & Plan Note (Signed)
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

## 2019-11-01 NOTE — Assessment & Plan Note (Signed)
Metastatic primary lung cancer with bony involvement in the spine epidural involvement of tumor with severe pain not controlled with opiates  I collaborated with Dr. Rhea Pink of palliative medicine she is indicating we may switch him to methadone she says she will own the management of this patient's pain control and will see him in the cancer clinic tomorrow on 6 October.  In the interim I will increase the fentanyl to 2 patches every 72 hours 50 mcg each per Dr. Delanna Ahmadi direction I also will attempt to achieve Celebrex twice daily using a patient assistance find is the patient cannot afford the cost of the Celebrex on the co-pay of his insurance

## 2019-11-01 NOTE — Telephone Encounter (Signed)
Celecoxib PA  Has been approved thru 10/31/20

## 2019-11-01 NOTE — Telephone Encounter (Signed)
I left a detailed message on the patient's wife's cell about a couple of updated appointments. Shona Simpson PA-C, requested that Mr. Zambito brain MRI be moved up sooner due to some CNS symptoms he is experiencing. The scan has been moved up from November to October 14th. In addition to that, I have also rescheduled his November visit with [name] to 10/19 for review of the scan.  Mont Dutton R.T.(R)(T) Radiation Special Procedures Navigator

## 2019-11-01 NOTE — Assessment & Plan Note (Signed)
Continue magnesium supplementation

## 2019-11-01 NOTE — Assessment & Plan Note (Signed)
Prior metastases to the brain status post radiotherapy

## 2019-11-01 NOTE — Addendum Note (Signed)
Addended by: Asencion Noble E on: 11/01/2019 10:19 AM   Modules accepted: Level of Service

## 2019-11-02 ENCOUNTER — Ambulatory Visit
Admission: RE | Admit: 2019-11-02 | Discharge: 2019-11-02 | Disposition: A | Discharge status: Home / Self Care | Payer: Medicaid Other | Source: Ambulatory Visit | Attending: Radiation Oncology | Admitting: Radiation Oncology

## 2019-11-02 ENCOUNTER — Inpatient Hospital Stay (HOSPITAL_BASED_OUTPATIENT_CLINIC_OR_DEPARTMENT_OTHER): Payer: Medicaid Other | Admitting: Hematology and Oncology

## 2019-11-02 ENCOUNTER — Inpatient Hospital Stay: Payer: Medicaid Other

## 2019-11-02 ENCOUNTER — Other Ambulatory Visit: Payer: Self-pay

## 2019-11-02 ENCOUNTER — Other Ambulatory Visit: Payer: Self-pay | Admitting: Hematology and Oncology

## 2019-11-02 VITALS — BP 136/91 | HR 88 | Resp 20

## 2019-11-02 VITALS — BP 140/86 | HR 97 | Temp 97.5°F | Resp 18 | Ht 67.0 in | Wt 205.9 lb

## 2019-11-02 DIAGNOSIS — Z510 Encounter for antineoplastic radiation therapy: Secondary | ICD-10-CM | POA: Diagnosis not present

## 2019-11-02 DIAGNOSIS — C7949 Secondary malignant neoplasm of other parts of nervous system: Secondary | ICD-10-CM | POA: Diagnosis not present

## 2019-11-02 DIAGNOSIS — C7931 Secondary malignant neoplasm of brain: Secondary | ICD-10-CM

## 2019-11-02 DIAGNOSIS — C3491 Malignant neoplasm of unspecified part of right bronchus or lung: Secondary | ICD-10-CM

## 2019-11-02 LAB — CBC WITH DIFFERENTIAL (CANCER CENTER ONLY)
Abs Immature Granulocytes: 0.08 10*3/uL — ABNORMAL HIGH (ref 0.00–0.07)
Basophils Absolute: 0 10*3/uL (ref 0.0–0.1)
Basophils Relative: 0 %
Eosinophils Absolute: 0.1 10*3/uL (ref 0.0–0.5)
Eosinophils Relative: 1 %
HCT: 34.7 % — ABNORMAL LOW (ref 39.0–52.0)
Hemoglobin: 10.2 g/dL — ABNORMAL LOW (ref 13.0–17.0)
Immature Granulocytes: 1 %
Lymphocytes Relative: 3 %
Lymphs Abs: 0.2 10*3/uL — ABNORMAL LOW (ref 0.7–4.0)
MCH: 27.6 pg (ref 26.0–34.0)
MCHC: 29.4 g/dL — ABNORMAL LOW (ref 30.0–36.0)
MCV: 94 fL (ref 80.0–100.0)
Monocytes Absolute: 1 10*3/uL (ref 0.1–1.0)
Monocytes Relative: 14 %
Neutro Abs: 5.8 10*3/uL (ref 1.7–7.7)
Neutrophils Relative %: 81 %
Platelet Count: 147 10*3/uL — ABNORMAL LOW (ref 150–400)
RBC: 3.69 MIL/uL — ABNORMAL LOW (ref 4.22–5.81)
RDW: 22 % — ABNORMAL HIGH (ref 11.5–15.5)
WBC Count: 7.1 10*3/uL (ref 4.0–10.5)
nRBC: 0.4 % — ABNORMAL HIGH (ref 0.0–0.2)

## 2019-11-02 LAB — CMP (CANCER CENTER ONLY)
ALT: 144 U/L — ABNORMAL HIGH (ref 0–44)
AST: 87 U/L — ABNORMAL HIGH (ref 15–41)
Albumin: 3.3 g/dL — ABNORMAL LOW (ref 3.5–5.0)
Alkaline Phosphatase: 467 U/L — ABNORMAL HIGH (ref 38–126)
Anion gap: 7 (ref 5–15)
BUN: 17 mg/dL (ref 6–20)
CO2: 29 mmol/L (ref 22–32)
Calcium: 9.3 mg/dL (ref 8.9–10.3)
Chloride: 106 mmol/L (ref 98–111)
Creatinine: 0.91 mg/dL (ref 0.61–1.24)
GFR, Estimated: >60 mL/min (ref >60)
Glucose, Bld: 154 mg/dL — ABNORMAL HIGH (ref 70–99)
Potassium: 3.9 mmol/L (ref 3.5–5.1)
Sodium: 142 mmol/L (ref 135–145)
Total Bilirubin: 0.6 mg/dL (ref 0.3–1.2)
Total Protein: 7.1 g/dL (ref 6.5–8.1)

## 2019-11-02 MED ORDER — SODIUM CHLORIDE 0.9% FLUSH
10.0000 mL | INTRAVENOUS | Status: DC | PRN
  Administered 2019-11-02: 10 mL via INTRAVENOUS
  Filled 2019-11-02: qty 10

## 2019-11-02 MED ORDER — HEPARIN SOD (PORK) LOCK FLUSH 100 UNIT/ML IV SOLN
500.0000 [IU] | Freq: Once | INTRAVENOUS | Status: AC
  Administered 2019-11-02: 500 [IU] via INTRAVENOUS
  Filled 2019-11-02: qty 5

## 2019-11-02 MED ORDER — SODIUM CHLORIDE 0.9% FLUSH
3.0000 mL | Freq: Once | INTRAVENOUS | Status: AC | PRN
  Administered 2019-11-02: 10 mL
  Filled 2019-11-02: qty 10

## 2019-11-02 NOTE — Addendum Note (Signed)
Addended by: Doristine Section L on: 11/02/2019 05:18 PM   Modules accepted: Orders, SmartSet

## 2019-11-02 NOTE — Progress Notes (Signed)
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 Short term f/u of this lesion was recommended. CXR on 05/01/2018 performed with recommended f/u imaging. 2) 05/10/2019: presented to the ED with headache and left pelvis pain. Patient underwent CT C/A/P and MRI brain. Brain MRI showed numerous enhancing parenchymal intracranial lesions likely reflecting metastases. CT scan showed cavitary lesion in the right lung apex with spiculated margins. Additional sites of disease include lymph nodes of the chest and numerous liver mets. In the bones there were multiple lytic appearing lesions throughout the thoracic and lumbar spine as well as portions of the bony pelvis. Referred to Oncology. 3) 05/16/2019: US biopsy of the liver performed. Findings consistent with a poorly differentiated adenocarcinoma.  4) 05/19/2019: establish care with Dr. Lorenso Courier.  5) 05/30/2019: CT simulation. Received Radiation to brain/spine.  5) 06/23/2019: Received Cycle 1 Day 1 of Carbo/Pemetrexed while inpatient. Pembrolizumab not able to be administered in inpatient setting. 6) 07/13/2019:Cycle 2 Day 1 of Carboplatin/Pemetrexed/Pembrolizumab 7)  08/04/2019:Cycle 3 day 1 of Carboplatin/Pemetrexed/Pembrolizumab 8) 08/24/2019: Cycle 4 day 1 of Carboplatin/Pemetrexed/Pembrolizumab 9) 09/14/2019: Cycle 5 day 1 of Carboplatin/Pemetrexed/Pembrolizumab 10) 10/09/2019: Cycle 6 day 1 of Pemetrexed/Pembrolizumab (held Carboplatin due to cytopenias. Intended to begin holding on cycle 7) 11) Cycle 7 delayed due to the patient being admitted.   Interval History:  William Ali 34 y.o. male with medical history significant for metastatic adenocarcinoma of the  lung who presents for a follow up visit.  The patient was last seen while in the hospital from 10/12/2019 to 10/28/2019. He presents now for re-evaluation prior to starting therapy.   On exam today William Ali notes that his pain is "decent".  He reports that he is still having some abdominal pain and of some additional "gas pain".  He states that it is a 4-10 in severity at this time.  He notes that he began taking his new regimen of pain medications and reports that he is sleeping well with the addition of trazodone.  He believes that the radiation therapy did help his pain improve.  He notes that his bowels are moving well and appetite is good.  Unfortunately he also notes that he still feels his teeth are loose and can feel "soft".  He has not yet seen a dentist.  Otherwise he reports his family is doing well he has no additional questions concerns or complaints.  He has had no issues with nausea, vomiting, or diarrhea.  He denies having any fevers, chills, sweats.  He does occasionally have symptoms of the shortness of breath, but is not having any cough.  He has been tolerating his Xarelto therapy well without any bleeding, or bruising.  A full 10 point ROS is listed below.  MEDICAL HISTORY:  Past Medical History:  Diagnosis Date  . Back pain   . Hypertension   . met lung ca to liver, spine, and brain dx'd 03/2019    SURGICAL HISTORY: Past Surgical History:  Procedure Laterality Date  . IR IMAGING GUIDED PORT INSERTION  06/12/2019  . KNEE SURGERY      SOCIAL HISTORY: Social History   Socioeconomic History  . Marital status: Married    Spouse name: Not on file  . Number of children: Not on  file  . Years of education: Not on file  . Highest education level: Not on file  Occupational History  . Not on file  Tobacco Use  . Smoking status: Never Smoker  . Smokeless tobacco: Never Used  Vaping Use  . Vaping Use: Never used  Substance and Sexual Activity  . Alcohol use: No  . Drug  use: No  . Sexual activity: Yes  Other Topics Concern  . Not on file  Social History Narrative  . Not on file   Social Determinants of Health   Financial Resource Strain:   . Difficulty of Paying Living Expenses: Not on file  Food Insecurity:   . Worried About Charity fundraiser in the Last Year: Not on file  . Ran Out of Food in the Last Year: Not on file  Transportation Needs:   . Lack of Transportation (Medical): Not on file  . Lack of Transportation (Non-Medical): Not on file  Physical Activity:   . Days of Exercise per Week: Not on file  . Minutes of Exercise per Session: Not on file  Stress:   . Feeling of Stress : Not on file  Social Connections:   . Frequency of Communication with Friends and Family: Not on file  . Frequency of Social Gatherings with Friends and Family: Not on file  . Attends Religious Services: Not on file  . Active Member of Clubs or Organizations: Not on file  . Attends Archivist Meetings: Not on file  . Marital Status: Not on file  Intimate Partner Violence:   . Fear of Current or Ex-Partner: Not on file  . Emotionally Abused: Not on file  . Physically Abused: Not on file  . Sexually Abused: Not on file    FAMILY HISTORY: Family History  Problem Relation Age of Onset  . Hypertension Mother   . Diabetes Father     ALLERGIES:  is allergic to onion, other, peanut oil, and peanut-containing drug products.  MEDICATIONS:  Current Outpatient Medications  Medication Sig Dispense Refill  . albuterol (VENTOLIN HFA) 108 (90 Base) MCG/ACT inhaler TAKE 2 PUFFS BY MOUTH EVERY 6 HOURS AS NEEDED FOR WHEEZE OR SHORTNESS OF BREATH (Patient taking differently: Inhale 2 puffs into the lungs every 6 (six) hours as needed for wheezing or shortness of breath. ) 6.7 g 1  . ALPRAZolam (XANAX) 1 MG tablet Take 1 tablet (1 mg total) by mouth every 4 (four) hours as needed for anxiety. 30 tablet 0  . celecoxib (CELEBREX) 100 MG capsule Take 1 capsule  (100 mg total) by mouth 2 (two) times daily. 60 capsule 1  . dexamethasone (DECADRON) 2 MG tablet Take 1 tablet (2 mg total) by mouth daily. 30 tablet 3  . DULoxetine (CYMBALTA) 30 MG capsule Take 1 capsule (30 mg total) by mouth daily. 30 capsule 3  . feeding supplement, ENSURE ENLIVE, (ENSURE ENLIVE) LIQD Take 237 mLs by mouth 2 (two) times daily between meals. 237 mL 12  . fentaNYL (DURAGESIC) 50 MCG/HR Place 1 patch onto the skin every other day. 5 patch 0  . folic acid (FOLVITE) 1 MG tablet Take 1 tablet (1 mg total) by mouth daily. 60 tablet 3  . ibuprofen (IBU) 800 MG tablet Take 1 tablet (800 mg total) by mouth in the morning and at bedtime. (Patient not taking: Reported on 11/02/2019) 30 tablet 0  . lidocaine-prilocaine (EMLA) cream Apply 1 application topically as needed. (Patient taking differently: Apply 1 application topically as needed (  access port). ) 30 g 2  . magnesium oxide (MAG-OX) 400 (241.3 Mg) MG tablet Take 2 tablets (800 mg total) by mouth daily. 30 tablet 3  . metoprolol succinate (TOPROL XL) 50 MG 24 hr tablet Take 1 tablet (50 mg total) by mouth daily. Take with or immediately following a meal. 30 tablet 11  . ondansetron (ZOFRAN) 4 MG tablet Take 1 tablet (4 mg total) by mouth every 8 (eight) hours as needed for nausea or vomiting. 60 tablet 0  . Oxycodone HCl 20 MG TABS Take 1-2 tablets (20-40 mg total) by mouth every 6 (six) hours as needed (Breakthrough pain). 60 tablet 0  . pantoprazole (PROTONIX) 40 MG tablet Take 1 tablet (40 mg total) by mouth 2 (two) times daily. 60 tablet 3  . polyethylene glycol (MIRALAX / GLYCOLAX) 17 g packet Take 17 g by mouth daily as needed for moderate constipation. 14 each 0  . polyvinyl alcohol (LIQUIFILM TEARS) 1.4 % ophthalmic solution Place 1 drop into both eyes as needed for dry eyes. 15 mL 0  . rivaroxaban (XARELTO) 20 MG TABS tablet Take 1 tablet (20 mg total) by mouth daily with supper. 90 tablet 1  . senna-docusate (SENOKOT-S)  8.6-50 MG tablet Take 2 tablets by mouth at bedtime.    . traZODone (DESYREL) 100 MG tablet Take 1 tablet (100 mg total) by mouth at bedtime. 30 tablet 0   No current facility-administered medications for this visit.    REVIEW OF SYSTEMS:   Constitutional: ( - ) fevers, ( - )  chills , ( - ) night sweats Eyes: ( - ) blurriness of vision, ( - ) double vision, ( - ) watery eyes Ears, nose, mouth, throat, and face: ( - ) mucositis, ( - ) sore throat Respiratory: ( - ) cough, ( - ) dyspnea, ( - ) wheezes Cardiovascular: ( - ) palpitation, ( - ) chest discomfort, ( - ) lower extremity swelling Gastrointestinal:  ( - ) nausea, ( - ) heartburn, ( - ) change in bowel habits Skin: ( - ) abnormal skin rashes Lymphatics: ( - ) new lymphadenopathy, ( - ) easy bruising Neurological: ( - ) numbness, ( - ) tingling, ( - ) new weaknesses Behavioral/Psych: ( - ) mood change, ( - ) new changes  All other systems were reviewed with the patient and are negative.  PHYSICAL EXAMINATION: ECOG PERFORMANCE STATUS: 2 - Symptomatic, <50% confined to bed  Vitals:   11/02/19 1521  BP: 140/86  Pulse: 97  Resp: 18  Temp: (!) 97.5 F (36.4 C)  SpO2: 100%   Filed Weights   11/02/19 1521  Weight: 205 lb 14.4 oz (93.4 kg)    GENERAL: well appearing young African American male in NAD  SKIN: skin color, texture, turgor are normal, no rashes or significant lesions NECK: supple, nontender LYMPH: fullness of the cervical lymph nodes bilaterally. No clear axillary lymph nodes. Stable from prior EYES: conjunctiva are pink and non-injected, sclera clear LUNGS: clear to auscultation and percussion with normal breathing effort HEART: regular rate & rhythm and no murmurs and no lower extremity edema Musculoskeletal: no cyanosis of digits and no clubbing. Increased swelling inferior to axilla bilaterally. Also noted to have soft tissue swelling in left leg  PSYCH: alert & oriented x 3, fluent speech NEURO: no focal  motor/sensory deficits  LABORATORY DATA:  I have reviewed the data as listed CBC Latest Ref Rng & Units 11/02/2019 10/28/2019 10/27/2019  WBC 4.0 - 10.5  K/uL 7.1 6.9 6.4  Hemoglobin 13.0 - 17.0 g/dL 10.2(L) 9.1(L) 8.7(L)  Hematocrit 39 - 52 % 34.7(L) 29.7(L) 29.0(L)  Platelets 150 - 400 K/uL 147(L) 188 162    CMP Latest Ref Rng & Units 11/02/2019 10/27/2019 10/26/2019  Glucose 70 - 99 mg/dL 154(H) 131(H) 122(H)  BUN 6 - 20 mg/dL _0 Creatinine 0.61 - 1.24 mg/dL 0.91 0.88 0.82  Sodium 135 - 145 mmol/L 142 145 144  Potassium 3.5 - 5.1 mmol/L 3.9 3.8 3.3(L)  Chloride 98 - 111 mmol/L 106 110 109  CO2 22 - 32 mmol/L _1 Calcium 8.9 - 10.3 mg/dL 9.3 8.4(L) 8.3(L)  Total Protein 6.5 - 8.1 g/dL 7.1 5.9(L) 5.9(L)  Total Bilirubin 0.3 - 1.2 mg/dL 0.6 0.5 0.5  Alkaline Phos 38 - 126 U/L 467(H) 353(H) 344(H)  AST 15 - 41 U/L 87(H) 91(H) 91(H)  ALT 0 - 44 U/L 144(H) 106(H) 96(H)    RADIOGRAPHIC STUDIES: DG Chest 2 View  Result Date: 10/12/2019 CLINICAL DATA:  Chest pain, shortness of breath EXAM: CHEST - 2 VIEW COMPARISON:  06/17/2019 FINDINGS: Right medial upper lobe mass/opacity again noted. Left lung clear. Heart is normal size. No effusions. Right Port-A-Cath in place, unchanged. IMPRESSION: Right upper lobe medial masslike opacity again noted. Electronically Signed   By: Rolm Baptise M.D.   On: 10/12/2019 11:30   CT Head Wo Contrast  Result Date: 10/12/2019 CLINICAL DATA:  Headache EXAM: CT HEAD WITHOUT CONTRAST TECHNIQUE: Contiguous axial images were obtained from the base of the skull through the vertex without intravenous contrast. COMPARISON:  06/17/2019 MRI 09/06/2019 FINDINGS: Brain: Area of low-density seen in the left frontal lobe subcortical white matter. No hemorrhage or hydrocephalus. Vascular: No hyperdense vessel or unexpected calcification. Skull: No acute calvarial abnormality. Sinuses/Orbits: Mucosal thickening in the sphenoid sinuses. No change. Other: None  IMPRESSION: Area of subcortical low-density in the left frontal lobe likely related to edema surrounding 1 of the many brain lesions seen on prior MRI. No hemorrhage. Electronically Signed   By: Rolm Baptise M.D.   On: 10/12/2019 14:06   CT Angio Chest PE W/Cm &/Or Wo Cm  Result Date: 10/12/2019 CLINICAL DATA:  PE suspected, metastatic lung cancer EXAM: CT ANGIOGRAPHY CHEST WITH CONTRAST TECHNIQUE: Multidetector CT imaging of the chest was performed using the standard protocol during bolus administration of intravenous contrast. Multiplanar CT image reconstructions and MIPs were obtained to evaluate the vascular anatomy. CONTRAST:  13m OMNIPAQUE IOHEXOL 350 MG/ML SOLN COMPARISON:  CT chest abdomen pelvis, 09/12/2019 FINDINGS: Cardiovascular: Satisfactory opacification of the pulmonary arteries to the segmental level. No evidence of pulmonary embolism. Normal heart size. No pericardial effusion. Right chest port catheter Mediastinum/Nodes: Unchanged enlarged pretracheal and right hilar lymph nodes. Thyroid gland, trachea, and esophagus demonstrate no significant findings. Lungs/Pleura: New small right pleural effusion and associated atelectasis or consolidation. Unchanged post treatment appearance of a right upper lobe mass and surrounding paramedian consolidation Upper Abdomen: No acute abnormality. Musculoskeletal: No chest wall abnormality. Unchanged, extensive sclerotic osseous metastatic disease, notable for a pathologic fracture of the T3 vertebral body. Review of the MIP images confirms the above findings. IMPRESSION: 1. Negative examination for pulmonary embolism. 2. New small right pleural effusion and associated atelectasis or consolidation. 3. Unchanged post treatment appearance of a right upper lobe mass and surrounding paramedian consolidation when compared to recent staging CT. 4. Unchanged enlarged mediastinal lymph nodes and extensive sclerotic osseous metastatic disease. Electronically Signed    By: ACristie Hem  Laqueta Carina M.D.   On: 10/12/2019 14:31   MR THORACIC SPINE WO CONTRAST  Result Date: 10/14/2019 CLINICAL DATA:  34 year old male with metastatic lung cancer. History of T3 pathologic fracture and multilevel thoracic ventral epidural tumor in May. Back pain. EXAM: MRI THORACIC SPINE WITHOUT CONTRAST TECHNIQUE: Multiplanar, multisequence MR imaging of the thoracic spine was performed. No intravenous contrast was administered. COMPARISON:  Thoracic MRI 06/17/2019. FINDINGS: The examination had to be discontinued prior to completion due to pain. Only one set of motion degraded axial T2 weighted images could be obtained. Limited cervical spine imaging: Subtotal involvement of the C7 vertebra by tumor. Preserved cervicothoracic junction alignment. Thoracic spine segmentation: Appears to be normal, same numbering system used in May. Alignment:  Stable since May. Vertebrae: Diffuse thoracic bone metastases, with subtotal to complete vertebral infiltration redemonstrated at T1, T3, T6. Multilevel posterior element involvement, worst at T5 as before. Stable T3 vertebral body pathologic compression fracture. No new pathologic fracture identified in the thoracic spine. No areas of progressed marrow edema since May. Cord: No thoracic spinal cord edema has developed since May. Stable noncontrast appearance of the cord. Conus medullaris occurs below T12-L1. Paraspinal and other soft tissues: Small right pleural effusion has increased. Otherwise grossly stable visible thoracic and upper abdominal viscera. Thoracic paraspinal soft tissues have not significantly changed, including some bilateral erector spinae muscle edema related to posterior element tumor involvement. Disc levels: Motion degraded axial images today such that epidural extension of tumor and mild spinal stenosis can only be discerned from sagittal sequences. Within that limitation, the epidural tumor extension and mild spinal stenosis at T3, T5, T6, T7, and  T9 appears stable since May. New mild ventral epidural tumor is suspected at T11 now, with new mild spinal stenosis there. Epidural tumor involving the bilateral T5 neural foramina has increased, but is stable at the right T6 neural foramen. IMPRESSION: 1. Largely stable diffuse thoracic spine metastases since the noncontrast MRI in May. 2. New mild epidural tumor and mild spinal stenosis at the T11 level. Increased epidural tumor in the bilateral T5 neural foramina, stable at the right T6 foramen. Stable epidural extension and mild spinal stenosis at T3, T5, T6, T7, and T9. No associated spinal cord edema. 3. Stable T3 pathologic fracture.  No new pathologic fracture. 4. Layering right pleural effusion appears increased. Electronically Signed   By: Genevie Ann M.D.   On: 10/14/2019 15:57   MR LUMBAR SPINE WO CONTRAST  Result Date: 10/14/2019 CLINICAL DATA:  34 year old male with metastatic lung cancer. History of T3 pathologic fracture and multilevel thoracic ventral epidural tumor in May. Back pain. EXAM: MRI LUMBAR SPINE WITHOUT CONTRAST TECHNIQUE: Multiplanar, multisequence MR imaging of the lumbar spine was performed. No intravenous contrast was administered. COMPARISON:  Thoracic spine MRI today reported separately. Lumbar MRI 06/17/2019. FINDINGS: Segmentation: Normal, concordant with the thoracic spine numbering today. Alignment:  Stable since May. Vertebrae: Diffuse lumbar vertebral metastatic disease again noted. Near complete tumor infiltration of the L2 vertebra as before. Increased L3 vertebral body tumor since May. Extensive left L1 and right L4 vertebral tumor appears stable. L5 involvement appears stable. Diffuse tumor infiltration of the left sacral ala and central lower sacral segments appears stable. Mild pathologic fracture of the L2 superior endplate is stable. No new pathologic fracture. Conus medullaris and cauda equina: Conus extends to the L1 level. No signal abnormality in the visible lower  thoracic cord or conus. Paraspinal and other soft tissues: Stable since May. Negative visible abdominal viscera.  Mild lumbar paraspinal muscle edema related to tumor. Disc levels: New ventral epidural tumor at both L1 and L2, with mild new spinal stenosis at both levels. No foraminal tumor at this time. Possible early right ventral epidural tumor now at L3, although new spinal stenosis there appears more related to increased epidural lipomatosis. Mildly increased epidural lipomatosis and associated spinal stenosis also at L4 and L5. Superimposed lumbar spine degeneration, maximal at L3-L4, is stable. IMPRESSION: 1. Diffuse lumbosacral metastatic disease re-demonstrated. Stable mild L2 pathologic fracture. No new pathologic fracture. 2. Increased tumor infiltration of the L3 vertebra since May. Mild new ventral epidural tumor at L1, L2, and probably also L3. New mild spinal stenosis at those levels. 3. Increased lumbar spinal stenosis elsewhere related to interval increased epidural lipomatosis. Electronically Signed   By: Genevie Ann M.D.   On: 10/14/2019 16:06   CT ABDOMEN PELVIS W CONTRAST  Result Date: 10/19/2019 CLINICAL DATA:  Lower abdominal pain EXAM: CT ABDOMEN AND PELVIS WITH CONTRAST TECHNIQUE: Multidetector CT imaging of the abdomen and pelvis was performed using the standard protocol following bolus administration of intravenous contrast. CONTRAST:  134m OMNIPAQUE IOHEXOL 300 MG/ML  SOLN COMPARISON:  CT from 10/12/2019, plain film from earlier in the same day. FINDINGS: Lower chest: Bilateral pleural effusions are noted right greater than left. Associated basilar atelectatic changes are noted. Hepatobiliary: Liver demonstrates multiple ring-enhancing lesions consistent with metastatic disease similar to that noted on the prior exam. Gallbladder is within normal limits. Fatty infiltration of the liver is seen as well. Pancreas: Unremarkable. No pancreatic ductal dilatation or surrounding inflammatory  changes. Spleen: Normal in size without focal abnormality. Adrenals/Urinary Tract: Adrenal glands are within normal limits. Kidneys demonstrate a normal enhancement pattern bilaterally. No renal calculi are noted. Small cyst is noted within the right kidney. Bladder is within normal limits. Stomach/Bowel: The appendix is within normal limits. No obstructive or inflammatory changes of the colon are noted. Stomach is decompressed. Small bowel is within normal limits. Vascular/Lymphatic: No significant vascular findings are present. No enlarged abdominal or pelvic lymph nodes. Reproductive: Prostate is unremarkable. Other: No abdominal wall hernia or abnormality. No abdominopelvic ascites. Musculoskeletal: Sclerotic foci are noted throughout the visualized bony structures consistent with metastatic disease. This is stable from the prior exam. IMPRESSION: Stable hepatic and bony metastatic disease. Fatty liver. No other focal abnormality is noted. Electronically Signed   By: MInez CatalinaM.D.   On: 10/19/2019 22:58   CT Abdomen Pelvis W Contrast  Result Date: 10/12/2019 CLINICAL DATA:  Abdominal pain EXAM: CT ABDOMEN AND PELVIS WITH CONTRAST TECHNIQUE: Multidetector CT imaging of the abdomen and pelvis was performed using the standard protocol following bolus administration of intravenous contrast. CONTRAST:  1081mOMNIPAQUE IOHEXOL 350 MG/ML SOLN COMPARISON:  09/12/2019 FINDINGS: Lower chest: Please see separately dictated CT angiogram of the chest. Hepatobiliary: Hepatic steatosis. Numerous hypodense, rim enhancing lesions throughout the liver parenchyma, not significantly changed compared to recent prior staging CT. No gallstones, gallbladder wall thickening, or biliary dilatation. Pancreas: Unremarkable. No pancreatic ductal dilatation or surrounding inflammatory changes. Spleen: Normal in size without significant abnormality. Adrenals/Urinary Tract: Adrenal glands are unremarkable. Kidneys are normal, without  renal calculi, solid lesion, or hydronephrosis. Bladder is unremarkable. Stomach/Bowel: Stomach is within normal limits. Appendix appears normal. The colon is generally decompressed, however there is some suggestion of inflammatory wall thickening, particularly in the transverse colon (series 3, image 46). Vascular/Lymphatic: No significant vascular findings are present. Unchanged enlarged celiac axis lymph node measuring 1.0 x  0.8 cm (series 3, image 26). Reproductive: No mass or other significant abnormality. Other: No abdominal wall hernia or abnormality. No abdominopelvic ascites. Musculoskeletal: Numerous sclerotic osseous lesions, not significantly changed compared to prior examination. IMPRESSION: 1. The colon is generally decompressed, however there is some suggestion of inflammatory wall thickening, particularly in the transverse colon. Findings are suggestive of nonspecific infectious or inflammatory colitis. 2. No significant change in hepatic and osseous metastatic disease compared to recent staging CT. 3. Hepatic steatosis. 4. Please see separately dictated CT examination of the chest. Electronically Signed   By: Eddie Candle M.D.   On: 10/12/2019 14:20   DG CHEST PORT 1 VIEW  Result Date: 10/19/2019 CLINICAL DATA:  Pleuritic chest pain. History of metastatic lung cancer. EXAM: PORTABLE CHEST 1 VIEW COMPARISON:  October 12, 2019. FINDINGS: Stable position of right internal jugular Port-A-Cath. Increased right paratracheal density is noted concerning for worsening adenopathy or metastatic disease. Left lung is clear. No pneumothorax or pleural effusion is noted. Bony thorax is unremarkable. IMPRESSION: Increased right paratracheal density is noted concerning for worsening adenopathy or metastatic disease. Electronically Signed   By: Marijo Conception M.D.   On: 10/19/2019 13:52   DG Abd Portable 1V  Result Date: 10/19/2019 CLINICAL DATA:  Constipation. EXAM: PORTABLE ABDOMEN - 1 VIEW COMPARISON:   March 28, 2010. FINDINGS: The bowel gas pattern is normal. No significant stool burden is noted. No radio-opaque calculi or other significant radiographic abnormality are seen. IMPRESSION: Negative. Electronically Signed   By: Marijo Conception M.D.   On: 10/19/2019 13:53   VAS [place] LOWER EXTREMITY VENOUS (DVT)  Result Date: 10/13/2019  Lower Venous DVTStudy Indications: Swelling.  Anticoagulation: Eliquis. Limitations: Unable to tolerate compression- left leg. Comparison Study: 06/04/19 previous Performing Technologist: Abram Sander RVS  Examination Guidelines: A complete evaluation includes B-mode imaging, spectral Doppler, color Doppler, and power Doppler as needed of all accessible portions of each vessel. Bilateral testing is considered an integral part of a complete examination. Limited examinations for reoccurring indications may be performed as noted. The reflux portion of the exam is performed with the patient in reverse Trendelenburg.  +---------+---------------+---------+-----------+----------+--------------+ RIGHT    CompressibilityPhasicitySpontaneityPropertiesThrombus Aging +---------+---------------+---------+-----------+----------+--------------+ CFV      Full           Yes      Yes                                 +---------+---------------+---------+-----------+----------+--------------+ SFJ      Full                                                        +---------+---------------+---------+-----------+----------+--------------+ FV Prox  Full                                                        +---------+---------------+---------+-----------+----------+--------------+ FV Mid   Full                                                        +---------+---------------+---------+-----------+----------+--------------+  FV DistalFull                                                        +---------+---------------+---------+-----------+----------+--------------+ PFV       Full                                                        +---------+---------------+---------+-----------+----------+--------------+ POP      Full           Yes      Yes                                 +---------+---------------+---------+-----------+----------+--------------+ PTV      Full                                                        +---------+---------------+---------+-----------+----------+--------------+ PERO                                                  Not visualized +---------+---------------+---------+-----------+----------+--------------+   +---------+---------------+---------+-----------+----------+--------------+ LEFT     CompressibilityPhasicitySpontaneityPropertiesThrombus Aging +---------+---------------+---------+-----------+----------+--------------+ CFV      Full           Yes      Yes                                 +---------+---------------+---------+-----------+----------+--------------+ SFJ      Full                                                        +---------+---------------+---------+-----------+----------+--------------+ FV Prox                 Yes      Yes                                 +---------+---------------+---------+-----------+----------+--------------+ FV Mid                  Yes      Yes                                 +---------+---------------+---------+-----------+----------+--------------+ FV Distal               Yes      Yes                                 +---------+---------------+---------+-----------+----------+--------------+ PFV  Yes      Yes                                 +---------+---------------+---------+-----------+----------+--------------+ POP      Full           Yes      Yes                                 +---------+---------------+---------+-----------+----------+--------------+ PTV      Full                                                         +---------+---------------+---------+-----------+----------+--------------+ PERO                                                  Not visualized +---------+---------------+---------+-----------+----------+--------------+     Summary: BILATERAL: - No evidence of deep vein thrombosis seen in the lower extremities, bilaterally. - No evidence of superficial venous thrombosis in the lower extremities, bilaterally. -  LEFT: - Portions of this examination were limited- see technologist comments above.  *See table(s) above for measurements and observations. Electronically signed by Servando Snare MD on 10/13/2019 at 3:39:36 PM.    Final     ASSESSMENT & PLAN William Ali 34 y.o. male with medical history significant for metastatic adenocarcinoma of the lung who presents for a follow up visit prior to Cycle 7 of chemotherapy.   The patient successfully completed Cycle 1 of carboplatin and pemetrexed, started on 06/23/2019.  Unfortunately this had to be administered in the inpatient setting because the patient was admitted for pain control.  As we were unsure when he would be discharged we decided to move forward with the treatment at that time.  Pembrolizumab was not able to be administered in house due to logistical and insurance issues.  He received pembrolizumab during Cycle 2 which started on 07/13/2019.  On exam today William Ali is having better control of his pain and appears ready to restart chemotherapy.  We will plan for this to restart as expediently as possible.  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 This will give [place] therapeutic options later in his treatment course. Previously I made it clear to William Ali that all treatments for administering are palliative in nature and designed to shrink the tumor and reduce symptoms.  The goal is to extend life and provide him some quality of life.  Once again we reiterated  that there is no cure for his disease and that all steps moving forward should be made with this in mind.  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 Pathology could not definitively say, but he had a prior nodule on CXR and mass in lung appears to be the primary lesion -- Guardant 360 with actionable EGFR mutation noted.  --started Carbo/Pem/Pem (regimen detailed above) Cycle 1 Day 1 on 06/23/2019. Cycle 1 administered  inpatient, pembrolizumab was not able to be added at that time. Cycle 2 (07/13/2019) included pembroliuzmab, which will be included in all subsequent cycles.  --repeat CT scan on 09/12/2019 showed interval response to therapy. His next scan is due in Nov 2021.  --pain control regimen as listed below   --RTC when able to schedule next cycle of chemotherapy  #Facial Swelling, stable #Cervical Fullness/Lymphadenopathy, stable --bilateral facial swelling, most pronounced in the neck bilaterally. Some lid edema and facial fullness with no marked increase in arm swelling --no worsening abdominal or LE swelling. No difficultly breathing or swallowing.  --on exam today the cervical regions bilaterally felt stable.   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 and radiation oncology are following, Appreciate assistance in management of brain mets.  --pain control as noted below.  #Pain Control, stable --pain is predominately in the back and abdomen --appreciate the assistance of palliative care in this patient's pain management --patient is taking dexamethasone 2 mg PO daily  -- fentanyl patches to 50 mcg QOD  --oxycodone 20-74m q6H PRN --additionally is taking cymablata 334mPO and celebrex 20025maily. Trazadone for  sleep --senna docusate/PRN miralax for opioid induced constipation  #Osseus Lesions/Hip Pain #Loose Teeth --pain control as above --encourage dental evaluation (has yet to be done) --continue IV zometa therapy for bone protection. Started 07/13/2019, to continue q 3 months.   #Left Eye Vision Changes --[facility] of globe vs occipital lobe lesion --currently using eye patch over left eye.  --continue to monitor   # Weight Loss, stable --weight today is stable from prior  --encourage high calorie diet --possible increase appetite and weight from steroid therapy --continue to monitor  #Pulmonary Embolism --continue Xarelto 76m53m daily   No orders of the defined types were placed in this encounter.  All questions were answered. The patient knows to call the clinic with any problems, questions or concerns.  A total of more than 30 minutes were spent on this encounter and over half of that time was spent on counseling and coordination of care as outlined above.   JohnLedell Peoples Department of Hematology/Oncology [facility]: [phone]: [phone]: [email or web address]  11/02/2019 4:13 PM

## 2019-11-02 NOTE — Progress Notes (Signed)
Pt returned to [facility] after realizing his Port-a-cath was still accessed. Pt reported feeling dizzy. Pt reports not having anything to eat/drink in "a while" and that he and his wife were "leaving to grab dinner before going home". VSS. Port-a-cath deaccessed. Pt verbalized understanding to call [facility] with further questions/concerns.

## 2019-11-03 ENCOUNTER — Ambulatory Visit
Admission: RE | Admit: 2019-11-03 | Discharge: 2019-11-03 | Disposition: A | Discharge status: Home / Self Care | Payer: Medicaid Other | Source: Ambulatory Visit | Attending: Radiation Oncology | Admitting: Radiation Oncology

## 2019-11-03 DIAGNOSIS — Z510 Encounter for antineoplastic radiation therapy: Secondary | ICD-10-CM | POA: Diagnosis not present

## 2019-11-03 LAB — TSH: TSH: 3.231 u[IU]/mL (ref 0.320–4.118)

## 2019-11-03 NOTE — Progress Notes (Signed)
Palliative Care Follow-up Outpatient Visit  I met with William Ali and his wife after his treatment today in follow-up from his hospitalization and also after discussion with Dr. Joya Gaskins re: poorly controlled pain and symptoms. William Ali has been quite challenging to manage in terms of his pain due to a variety of side effects and intolerance related to opioids and adjuvants. He has been on many different regimens and since May 2021. He has never really had optimal control- he has extremely severe multilevel spinal metastasis. His pain is also highly variable and rarely consistent. When Dr. Joya Gaskins saw him yesterday he was not doing well and had a really bad night with pain. After discussion I recommended William Ali double his patch (92mg--->100mcg) and we arranged to get Celebrex filled for him. William Ali did not double his patch-they were able to get the Celebrex. Today he is managing much better than when I saw him in the hospital. His pain is much worse at night, he has restless legs and cannot get comfortable.His pain has layers of neuropathic, inflammatory and visceral pain- we have tried many combinations of medications. He also has anxiety and emotional pain understandably.  On his last admission he had opioid induced neurotoxicity with severe myoclonus, hallucinations and encephalopathy which were initially thought to be due to steroids but these improved after resuming steroids and switching him to fentanyl infusion instead of hydromorphone.   Recommendations/Plan:   1. I gave him the option today of either making the switch to methadone which after a period of careful titration may provide him with the best and most consistent pain relief vs. Maintaining his patch at his current dose of 524m and increasing his oxycodone to 20-407mrn which he can take especially at night for pain along with medication for sleep. William Ali wants to try to continue what he is doing and bump up the oxycodone for now. I will check  back in with him Friday after his treatment.  2. Ordered Trazodone 100m37mS for sleep-this may have some benefits for the neuropathic features of his pain. I reviewed his chart and gabapentin and lyrica were stopped due to concerns for causing confusion and dizziness- we could try these again if needed -he has gabapentin at home but has not been taking it. William Ali has alprazolam at home also for anxiety, but he doesn't think it helps him much. I discussed being cautious with sedation, especially at night and that a fall would be devastating for him. We have previously discussed safely treating pain and chemical coping. His wife is helping to supervise his medications.  3. Anti-Inflammatories have been very helpful for his pain. He is on decadron. Have ordered Celebrex 100mg49mce daily. Encouraged him to take consistently for the best pain relief and not as needed. Provided education on staying ahead of pain.  4. Discussed his case with [facility] onc - there are challenges ahead with treating his spine, but for now he is getting pain relief benefits.  5. Noted HTN is an issue- may consider using Clonidine as anti-hypertensive as it can also be a useful pain and anxiety adjuvant.  Will continue to check in on William Ali frequently and as needed.  William HackerPalliative Medicine   Time: 35 minutes

## 2019-11-04 LAB — T4: T4, Total: 10.5 ug/dL (ref 4.5–12.0)

## 2019-11-06 ENCOUNTER — Ambulatory Visit: Payer: Medicaid Other

## 2019-11-07 ENCOUNTER — Ambulatory Visit
Admission: RE | Admit: 2019-11-07 | Discharge: 2019-11-07 | Disposition: A | Discharge status: Home / Self Care | Payer: Medicaid Other | Source: Ambulatory Visit | Attending: Radiation Oncology | Admitting: Radiation Oncology

## 2019-11-07 ENCOUNTER — Other Ambulatory Visit: Payer: Self-pay

## 2019-11-07 DIAGNOSIS — Z510 Encounter for antineoplastic radiation therapy: Secondary | ICD-10-CM | POA: Diagnosis not present

## 2019-11-08 ENCOUNTER — Encounter: Payer: Self-pay | Admitting: Hematology and Oncology

## 2019-11-08 ENCOUNTER — Other Ambulatory Visit: Payer: Self-pay

## 2019-11-08 ENCOUNTER — Ambulatory Visit
Admission: RE | Admit: 2019-11-08 | Discharge: 2019-11-08 | Disposition: A | Discharge status: Home / Self Care | Payer: Medicaid Other | Source: Ambulatory Visit | Attending: Radiation Oncology | Admitting: Radiation Oncology

## 2019-11-08 ENCOUNTER — Other Ambulatory Visit: Payer: Self-pay | Admitting: Critical Care Medicine

## 2019-11-08 DIAGNOSIS — Z510 Encounter for antineoplastic radiation therapy: Secondary | ICD-10-CM | POA: Diagnosis not present

## 2019-11-08 MED ORDER — ONDANSETRON HCL 4 MG PO TABS: 4.0000 mg | ORAL_TABLET | Freq: Three times a day (TID) | ORAL | 0 refills | Status: DC | PRN

## 2019-11-08 MED ORDER — FENTANYL 50 MCG/HR TD PT72
1.0000 | MEDICATED_PATCH | TRANSDERMAL | 0 refills | Status: DC
Start: 2019-11-08 — End: 2019-11-09

## 2019-11-08 MED FILL — ONDANSETRON HCL 4 MG TABS: 4 | 20 days supply | Qty: 60 | Fill #0

## 2019-11-08 MED FILL — METOPROLOL SUCCINATE ER 50: 50 | 30 days supply | Qty: 30 | Fill #4

## 2019-11-09 ENCOUNTER — Ambulatory Visit
Admission: RE | Admit: 2019-11-09 | Discharge: 2019-11-09 | Disposition: A | Discharge status: Home / Self Care | Payer: Medicaid Other | Source: Ambulatory Visit | Attending: Radiation Oncology | Admitting: Radiation Oncology

## 2019-11-09 ENCOUNTER — Other Ambulatory Visit: Payer: Self-pay

## 2019-11-09 ENCOUNTER — Ambulatory Visit
Admission: RE | Admit: 2019-11-09 | Discharge: 2019-11-09 | Disposition: A | Discharge status: Home / Self Care | Payer: PRIVATE HEALTH INSURANCE | Source: Ambulatory Visit | Attending: Internal Medicine | Admitting: Internal Medicine

## 2019-11-09 DIAGNOSIS — Z510 Encounter for antineoplastic radiation therapy: Secondary | ICD-10-CM | POA: Diagnosis not present

## 2019-11-09 DIAGNOSIS — Z95828 Presence of other vascular implants and grafts: Secondary | ICD-10-CM

## 2019-11-09 DIAGNOSIS — C7931 Secondary malignant neoplasm of brain: Secondary | ICD-10-CM

## 2019-11-09 IMAGING — MR MR HEAD WO/W CM
11 series · 48 of 48 positions shown · IV contrast (multihance)
Comparison: [DATE]
COMPARISON: [DATE]

Addendum:
CLINICAL DATA: Metastatic lung cancer

EXAM:
MRI HEAD WITHOUT AND WITH CONTRAST
TECHNIQUE: Multiplanar, multiecho pulse sequences of the brain and surrounding
structures were obtained without and with intravenous contrast.
CONTRAST:  20mL MULTIHANCE GADOBENATE DIMEGLUMINE 529 MG/ML IV SOLN

[Series 2: FLAIR · sagittal · 3.0mm · 0.75mm/px · 3 of 39 slices shown (1 of 2)]
[im 1/39]
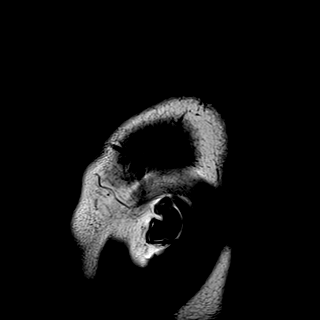
[im 20/39]
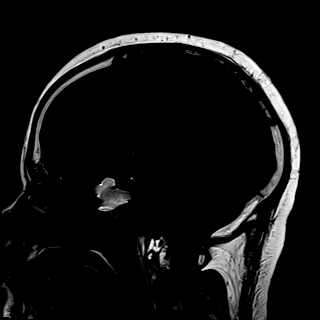
[im 39/39]
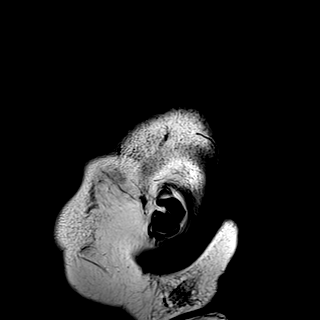

[Series 3: DWI · axial · 3.0mm · 1.50mm/px · z∈[-71,+77]mm · 4 of 78 slices shown (1 of 2)]
[im 1/78]
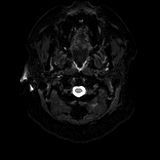
[im 26/78]
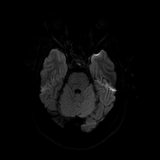
[im 52/78]
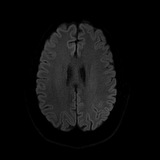
[im 78/78]
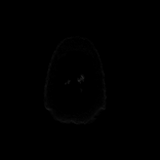

[Series 4: DWI · axial · 3.0mm · 1.50mm/px · z∈[-71,+77]mm · 2 of 39 slices shown (2 of 2)]
[im 1/39]
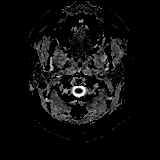
[im 39/39]
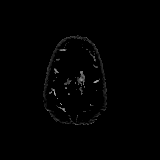

[Series 5: T2 · axial · 5.0mm · 0.57mm/px · z∈[-72,+84]mm · 2 of 27 slices shown (1 of 2)]
[im 1/27]
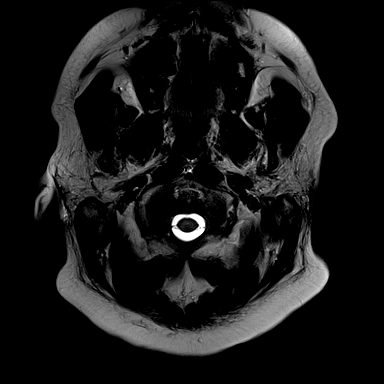
[im 27/27]
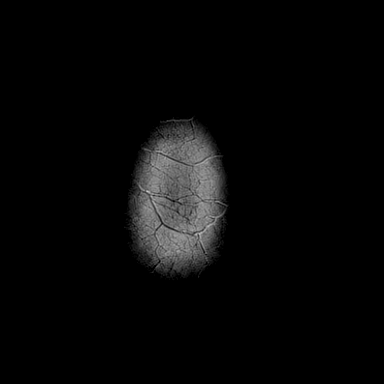

[Series 7: swi_images · axial · 1.5mm · 0.90mm/px · z∈[-74,+81]mm · 6 of 104 slices shown]
[im 1/104]
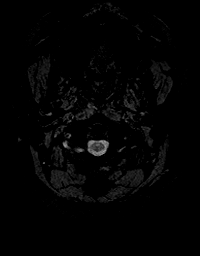
[im 21/104]
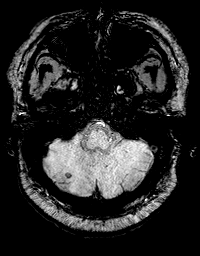
[im 42/104]
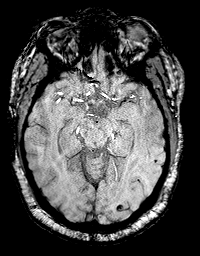
[im 62/104]
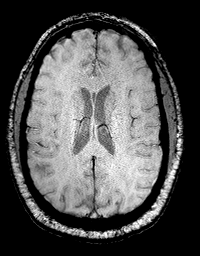
[im 83/104]
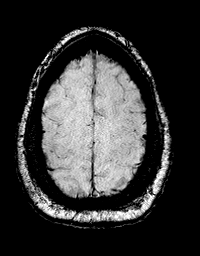
[im 104/104]
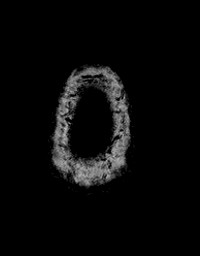

[Series 8: FLAIR · axial · 3.0mm · 0.86mm/px · z∈[-78,+96]mm · 3 of 59 slices shown (2 of 2)]
[im 1/59]
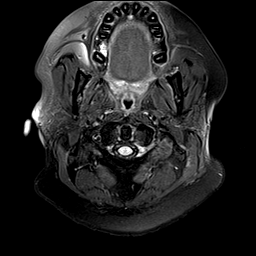
[im 30/59]
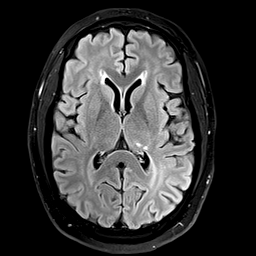
[im 59/59]
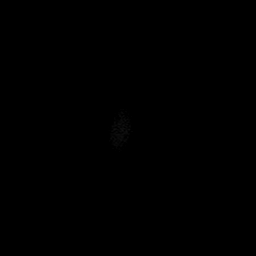

[Series 9: T2 · axial · non-contrast · 1.0mm · 0.86mm/px · z∈[-73,+99]mm · 10 of 176 slices shown (2 of 2)]
[im 1/176]
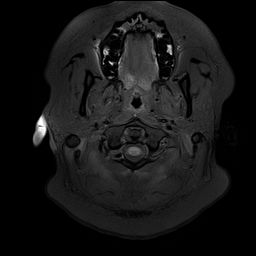
[im 20/176]
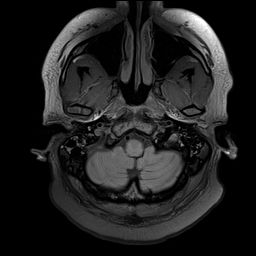
[im 39/176]
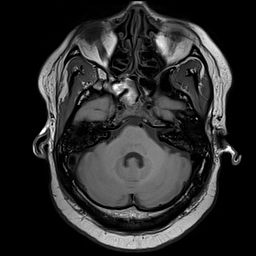
[im 59/176]
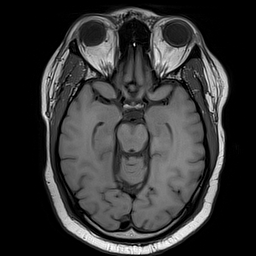
[im 78/176]
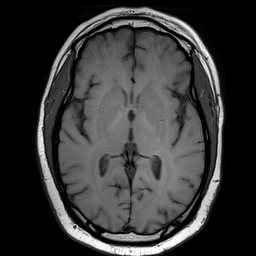
[im 98/176]
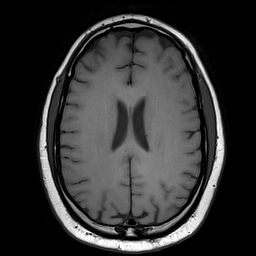
[im 117/176]
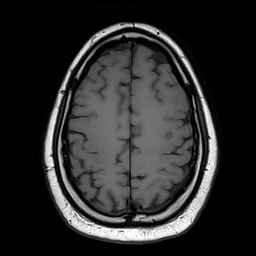
[im 137/176]
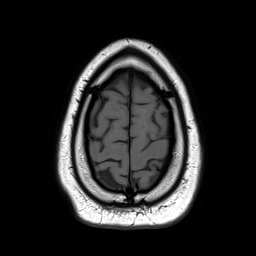
[im 156/176]
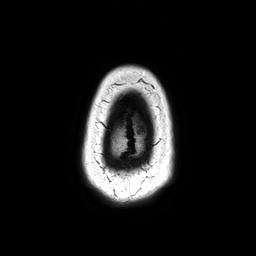
[im 176/176]
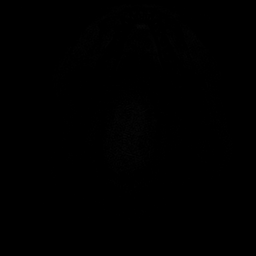

[Series 11: T2 post-contrast · axial · 1.0mm · 0.86mm/px · z∈[-73,+99]mm · 10 of 176 slices shown (1 of 2)]
[im 1/176]
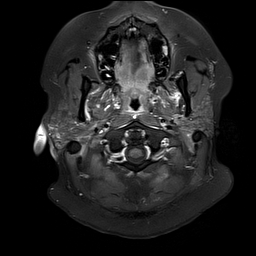
[im 20/176]
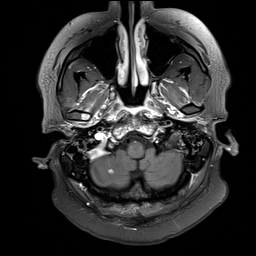
[im 39/176]
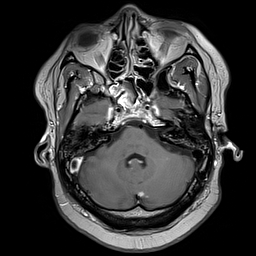
[im 59/176]
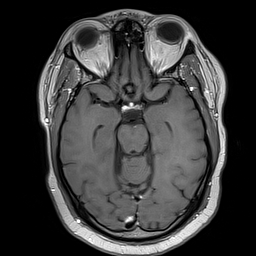
[im 78/176]
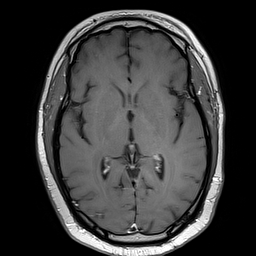
[im 98/176]
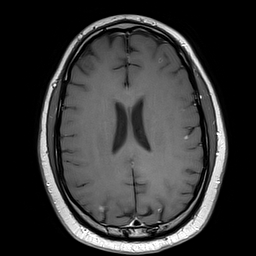
[im 117/176]
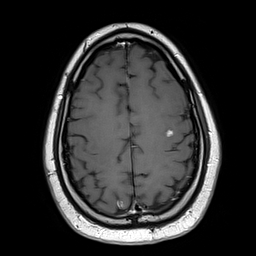
[im 137/176]
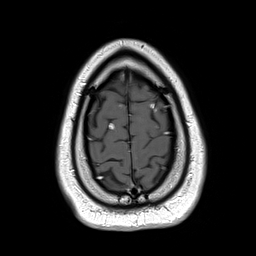
[im 156/176]
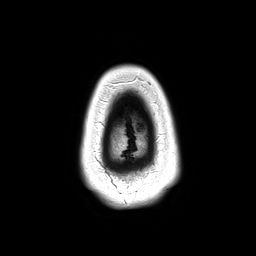
[im 176/176]
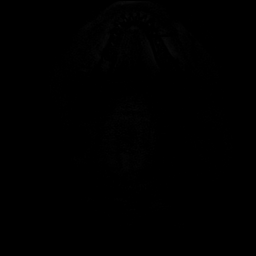

[Series 12: T2 post-contrast · coronal · 3.0mm · 0.57mm/px · 3 of 50 slices shown (2 of 2)]
[im 1/50]
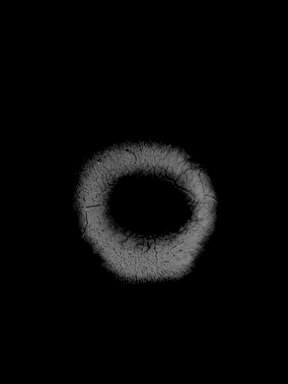
[im 25/50]
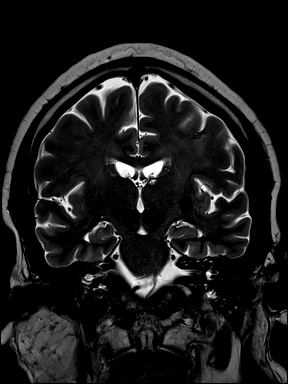
[im 50/50]
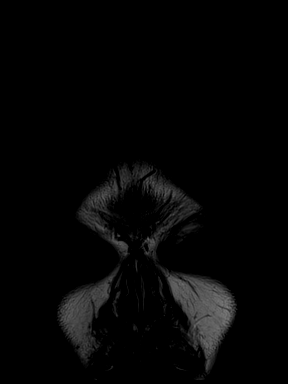

[Series 13: T1 post-contrast · coronal · 3.0mm · 0.57mm/px · 3 of 50 slices shown]
[im 1/50]
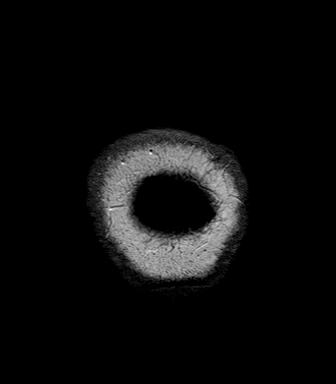
[im 25/50]
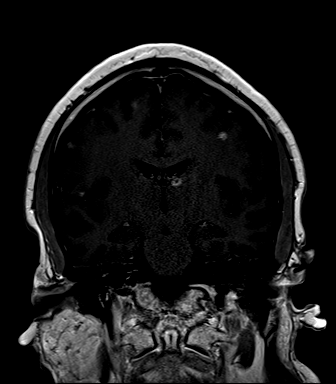
[im 50/50]
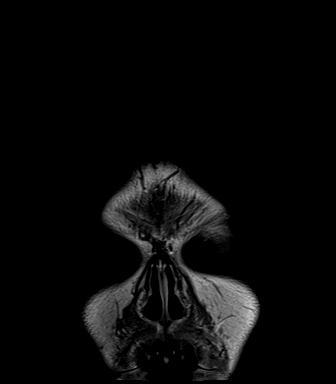

[Series 14: FLAIR post-contrast · sagittal · 3.0mm · 0.75mm/px · 2 of 39 slices shown]
[im 1/39]
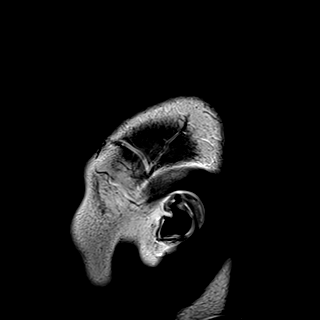
[im 39/39]
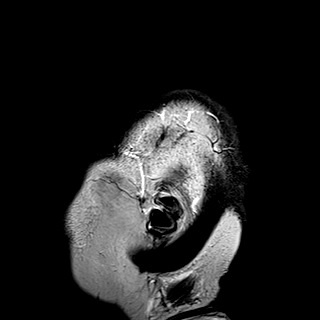

[48 of 48 positions shown; findings below may reference images not displayed]

FINDINGS: Brain: Innumerable metastatic lesions are again identified and are
stable to slightly decreased in size. For example, a left
frontoparietal lesion on series 11, image 117 measures up to 5 mm
(previously 10 mm). No new lesion identified. No significant edema.
Susceptibility is present association with many of the metastases
reflecting hemorrhage or mineralization likely related to treatment.

A punctate focus cortical diffusion hyperintensity the left frontal
lobe without enhancement or susceptibility probably reflects an
acute to subacute infarct (series 3, image 73). A similar punctate
focus is present in the right frontal lobe on the same slice. Patchy
T2 hyperintensity in the supratentorial white matter likely reflects
therapy related changes.

Vascular: Major vessel flow voids at the skull base are preserved.

Skull and upper cervical spine: Decreased T1 marrow signal
reflecting metastatic disease involving the skull base and upper
cervical spine.

Sinuses/Orbits: Paranasal sinus mucosal thickening. Chronic sphenoid
sinusitis. Orbits are unremarkable.

Other: Sella is unremarkable.  Patchy mastoid fluid opacification.
IMPRESSION: Stable to slightly decreased size innumerable parenchymal metastatic
lesions.

Persistent evidence of osseous metastatic disease.

Suspected two punctate acute to subacute infarcts of the frontal
lobes.

These results were called by telephone at the time of interpretation
on [DATE] at [DATE] to provider WILDER , who verbally
acknowledged these results.

ADDENDUM:
Contrast was administered via a port which was accessed by a
registered nurse.

*** End of Addendum ***
FINDINGS: Brain: Innumerable metastatic lesions are again identified and are
stable to slightly decreased in size. For example, a left
frontoparietal lesion on series 11, image 117 measures up to 5 mm
(previously 10 mm). No new lesion identified. No significant edema.
Susceptibility is present association with many of the metastases
reflecting hemorrhage or mineralization likely related to treatment.

A punctate focus cortical diffusion hyperintensity the left frontal
lobe without enhancement or susceptibility probably reflects an
acute to subacute infarct (series 3, image 73). A similar punctate
focus is present in the right frontal lobe on the same slice. Patchy
T2 hyperintensity in the supratentorial white matter likely reflects
therapy related changes.

Vascular: Major vessel flow voids at the skull base are preserved.

Skull and upper cervical spine: Decreased T1 marrow signal
reflecting metastatic disease involving the skull base and upper
cervical spine.

Sinuses/Orbits: Paranasal sinus mucosal thickening. Chronic sphenoid
sinusitis. Orbits are unremarkable.

Other: Sella is unremarkable.  Patchy mastoid fluid opacification.
IMPRESSION: Stable to slightly decreased size innumerable parenchymal metastatic
lesions.

Persistent evidence of osseous metastatic disease.

Suspected two punctate acute to subacute infarcts of the frontal
lobes.

These results were called by telephone at the time of interpretation
on [DATE] at [DATE] to provider WILDER , who verbally
acknowledged these results.

## 2019-11-09 MED ORDER — HEPARIN SOD (PORK) LOCK FLUSH 100 UNIT/ML IV SOLN
500.0000 [IU] | Freq: Once | INTRAVENOUS | Status: AC
  Administered 2019-11-09: 500 [IU] via INTRAVENOUS

## 2019-11-09 MED ORDER — GADOBENATE DIMEGLUMINE 529 MG/ML IV SOLN
20.0000 mL | Freq: Once | INTRAVENOUS | Status: AC | PRN
  Administered 2019-11-09: 20 mL via INTRAVENOUS

## 2019-11-09 MED ORDER — SODIUM CHLORIDE 0.9% FLUSH
10.0000 mL | INTRAVENOUS | Status: DC | PRN
  Administered 2019-11-09: 10 mL via INTRAVENOUS

## 2019-11-09 NOTE — Telephone Encounter (Signed)
Dr. Lorenso Courier, For your approval or refusal. Gardiner Rhyme, RN

## 2019-11-10 ENCOUNTER — Ambulatory Visit
Admission: RE | Admit: 2019-11-10 | Discharge: 2019-11-10 | Disposition: A | Discharge status: Home / Self Care | Payer: Medicaid Other | Source: Ambulatory Visit | Attending: Radiation Oncology | Admitting: Radiation Oncology

## 2019-11-10 ENCOUNTER — Other Ambulatory Visit: Payer: Self-pay

## 2019-11-10 DIAGNOSIS — Z510 Encounter for antineoplastic radiation therapy: Secondary | ICD-10-CM | POA: Diagnosis not present

## 2019-11-10 MED ORDER — FENTANYL 50 MCG/HR TD PT72
1.0000 | MEDICATED_PATCH | TRANSDERMAL | 0 refills | Status: DC
Start: 2019-11-10 — End: 2019-11-23

## 2019-11-10 MED FILL — Dexamethasone Sodium Phosphate Inj 100 MG/10ML: INTRAMUSCULAR | Qty: 1 | Status: AC

## 2019-11-10 MED FILL — Fosaprepitant Dimeglumine For IV Infusion 150 MG (Base Eq): INTRAVENOUS | Qty: 5 | Status: AC

## 2019-11-13 ENCOUNTER — Other Ambulatory Visit: Payer: Self-pay

## 2019-11-13 ENCOUNTER — Inpatient Hospital Stay: Payer: Medicaid Other

## 2019-11-13 ENCOUNTER — Ambulatory Visit
Admission: RE | Admit: 2019-11-13 | Discharge: 2019-11-13 | Disposition: A | Discharge status: Home / Self Care | Payer: Medicaid Other | Source: Ambulatory Visit | Attending: Radiation Oncology | Admitting: Radiation Oncology

## 2019-11-13 DIAGNOSIS — Z510 Encounter for antineoplastic radiation therapy: Secondary | ICD-10-CM | POA: Diagnosis not present

## 2019-11-13 NOTE — Progress Notes (Deleted)
Subjective:    Patient ID: William Ali, male    DOB: Jun 11, 1985, 34 y.o.   MRN: [id number]  08/23/19 This is a pleasant 34 year old male who comes to see me to establish for primary care and he is accompanied with his spouse.  This patient unfortunately developed a nodular density in March 2020 and another x-ray obtained in April 2020 recommending follow-up imaging with a nodule seen in the lung.  Subsequent to this the patient presented to the emergency room in mid April with headaches and left pelvic pain ultimately found to have a cavitary lesion in the right lung apex with spiculated margins and additional sites of disease including lymph nodes of the chest and numerous liver mets numerous multiple lytic appearing lesions throughout the thoracic and lumbar spine as well as portions of the bony pelvis.  Patient also had MRI of the brain showing numerous enhancing parenchymal intracranial lesions likely metastases.  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  The patient is now undergoing treatment and outlined below is the patient's treatment summary per oncology   #MetastaticAdenocarcinoma of the Lungwith Brain, [place],and Liver Metastasis 1)04/24/2018: Chest radiograph shows a 1.7 cm nodular density. Short term f/u of this lesion was recommended. CXR on 05/01/2018 performed with recommended f/u imaging. 2) 05/10/2019: presented to the ED with headache and left pelvis pain. Patient underwent CT C/A/P and MRI brain. Brain MRI showed numerous enhancing parenchymal intracranial lesions likely reflecting metastases. CT scan showedcavitary lesion in therightlung apex with spiculated margins. Additional sites of disease include lymph nodes of the chest and numerous liver mets. In the bones there weremultiple lytic appearing lesions throughout the thoracic and lumbar spine as well as portions of the bony pelvis. Referred to Oncology. 3)  05/16/2019: US biopsy of the liver performed. Findings consistent with a poorly differentiated adenocarcinoma.  4) 05/19/2019: establish care with Dr. Lorenso Courier.  5) 05/30/2019: CT simulation. Received Radiation to brain/spine.  5) 06/23/2019: Received Cycle 1 Day 1 of Carbo/Pemetrexed while inpatient. Pembrolizumab not able to be administered in inpatient setting. 6) 07/13/2019:Cycle 2 Day 1 of Carboplatin/Pemetrexed/Pembrolizumab 7)  08/04/2019:Cycle 3 day 1 of Carboplatin/Pemetrexed/Pembrolizumab  Note this patient now is starting cycle 4 of his chemotherapy and has finished his brain and spine radiation therapy.  But the patient does have access to Dilaudid as needed for pain and now also has a fentanyl patch which is helped to some degree.  He states his breathing is at a stable situation for now.  His wife states he has some noises he makes when he breathes at night.  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  The patient is currently receiving his medications retail at the [facility] pain extremely high price.  We will attempt to get his Xarelto and fentanyl patches at least at a reduced amount at the [facility].  Repeat scans are due to be obtained in August as noted.  Patient has a physical therapist coming to his home as well.  Note patient is on Decadron and antiemetics as well.  The patient is a full code at this time and is not yet in any type of hospice care but is interested in appropriately receiving aggressive treatment for his condition though he does realize it is a palliative approach as per oncology's recent note Note there is a history of pulmonary embolism which is why he is on the  Xarelto due to hypercoagulable state.  Also note the patient is being managed by radiation therapy, medical oncology and neuro-oncology.  The patient's weight loss is improved and he is taking a nutritional supplements at this  time.   10/31/2019 Seen at mobile unit for post hospital follow-up This patient unfortunately has metastatic adenocarcinoma of the lung and now has dense spinal involvement in thoracic and lumbar spine and is undergoing now radiation therapy.  He just finished his fifth treatment of the planned 15 course of treatment.  Patient just got out of the hospital when he was admitted for sepsis and delirium secondary to adverse side effects from fentanyl and Dilaudid combination.  Below is the discharge summary  Dc summary: PCP: Elsie Stain, MD  Admit date: 10/12/2019 Discharge date: 10/28/2019  Time spent: 35 minutes  Recommendations for Outpatient Follow-up:  1. Medication change: Duragesic patches 75 mcg every 72 hours discontinued and changed to 50 mcg every 72 hours 2. Medication change: Protonix 40 mg twice daily change from as needed to scheduled 3. New medication: Motrin 800 mg twice daily scheduled 4. Oxycodone 10 mg twice daily as needed for breakthrough pain 5. New medication: Magnesium 800 mg p.o. daily 6. New medication: MiraLAX as needed 7. Medication change: Decadron increased from 0.5 mg to 2 mg daily 8. New medication: Xanax 1 mg as needed for anxiety every 4 hours 9. Patient will continue with radiation oncology for spine metastases and pain control 10. We will coordinate with oncology to receive Zometa after discharge.dental exam is recommended prior to administration but shouldn't delay him receiving this at this point unless there is obvious dental erosions and untreated caries.  Discharge Diagnoses:  Active Hospital Problems  Diagnosis Date Noted .  Severe sepsis ([place]) 10/12/2019 . Chest pain  . Generalized abdominal pain  . Prolonged Q-T interval on ECG 10/14/2019 . Hypomagnesemia 10/14/2019 . Cancer related pain 10/14/2019 . Tachycardia 10/12/2019 . Colitis 10/12/2019 . Hypokalemia 10/12/2019 . Adenocarcinoma of lung ([place]) 05/21/2019 . Metastatic  adenocarcinoma to brain ([place]) 05/21/2019 . Goals of care, counseling/discussion 05/21/2019 . Essential hypertension 08/17/2018  Resolved Hospital Problems No resolved problems to display.   Discharge Condition: Improved although long-term prognosis is limited  Diet recommendation: Regular diet with Ensure twice daily between meals  Hospital Course:  Principal Problem: Severe sepsis ([place]) Active Problems:   Goals of care, counseling/discussion   Metastatic adenocarcinoma to brain [facility])   Adenocarcinoma of lung ([place])   Essential hypertension   Tachycardia   Colitis   Hypokalemia   Prolonged Q-T interval on ECG   Hypomagnesemia   Cancer related pain   Chest pain   Generalized abdominal pain Acute on chronic cancer-related back pain. Myoclonic jerks. Visual and auditory hallucination. Tremors. Continues to have intractable back pain, appears to be much improved. Greatly appreciate palliative care assistance with management.  Initially started on fentanyl patch, but started having myoclonic jerking as well as severe hallucinations.  Transition to fentanyl PCA which he seemed to do somewhat better with.  Palliative care intervened.  By tracking PCA usage, noted only have 2 bolus doses over the course of 24 hours.  Patient transitioned from PCA to a transdermal patch.  By 10/1, patient much improved and patient made following recommendations of being on Duragesic 50 mcg patch every 72 hours.  Oxycodone 10 mg twice a day for breakthrough pain.  Continuing Senokot and adding as needed MiraLAX.  Daily supplemental magnesium.  Increasing Decadron from 0.5 to 2 mg daily.  Has been responding well to Toradol so changed over to ibuprofen 800 mg twice a day and changing his as needed PPI to scheduled twice a day.  Initial recommendation was for Celebrex, however even with insurance approval, this medications $175 for every month.  Patient will continue with radiation for spine metastases and  pain control.  He will need to receive Zometa after discharge.  Severe sepsis, present on admission secondary to acute transverse colitis. With tachycardia tachypnea and lactic acidosis meeting severe sepsis criteria. Treated with IV fluids.Sepsis felt to be resolved. Also treated with IV broad-spectrum antibiotics and vancomycin 146m po QID x10 days due to +toxigenic CDiff PCR.  Antibiotics discontinued after 10-day course and infection felt to be resolved.  History of DVT and PE. - Restarted xarelto, continued upon discharge.  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 - Follow up with oncology, Dr. DLorenso Courier - Follow up with Rad Onc  Chemotherapy-induced pancytopenia.Platelets significantly lower along with rest of the blood count. Currently Dr. DLorenso Courierfrom hematology okay with anticoagulation as long as platelets remain above 30.  On day of discharge, platelet count at 188. - Continue xarelto for hx DVT/PE, malignancy  Hypomagnesemia - Replaced.  Started on mag oxide during his hospitalization, continue as outpatient indefinitely.  Obesity: Patient meets criteria with body mass index is 33.05 kg/m.  Delirium - Minimize polypharmacy.  Appreciate palliative care help  LFT elevation.Likely in response to liver involvement with metastasis. Mentation ok.   Consultants:  Oncology  Rad Onc  Palliative care  Since discharge the patient has developed increased pain in the back.  Upon review of the MRI of the lumbar spine there is epidural involvement with nerve roots.  The patient is on low-dose duloxetine however was not able to achieve the Celebrex prescription.  He cannot receive Toradol because of interactions with Xarelto.  Patient is also on the Xarelto for chronic recurrent DVT and pulmonary emboli.  The patient's not able to achieve outpatient palliative care in the home.  Dr. BRhea Pinkof the inpatient hospitalist service has a  clinic in the cancer center and I engaged with her at this visit she indicated she would see the patient back in return follow-up in 1 day at the cancer center.  Patient denies any dyspnea or cough.  He states on the fentanyl patch 50 mcg every 3 days and OxyContin  10 mg twice daily there is simply not enough pain management.  Again the patient cannot receive Celebrex as the co-pay was too high  He had been recommended to receive Lyrica or gabapentin but no prescription was written from the hospital  The transition of care note from our clinic RN was reviewed  11/14/2019    . Past Medical History:  Diagnosis Date  . Back pain   . Hypertension   . met lung ca to liver, spine, and brain dx'd 03/2019     Family History  Problem Relation Age of Onset  . Hypertension Mother   . Diabetes Father      Social History   Socioeconomic History  . Marital status: Married    Spouse name: Not on file  . Number of children: Not on file  . Years of education: Not on file  . Highest education level: Not on file  Occupational History  . Not on file  Tobacco Use  . Smoking status: Never Smoker  . Smokeless tobacco: Never Used  Vaping Use  . Vaping Use: Never used  Substance and Sexual Activity  . Alcohol use: No  . Drug use: No  . Sexual activity: Yes  Other Topics Concern  . Not on file  Social History Narrative  . Not on file   Social Determinants of Health   Financial Resource Strain:   . Difficulty of Paying Living Expenses: Not on file  Food Insecurity:   . Worried About Charity fundraiser in the Last Year: Not on file  . Ran Out of Food in the Last Year: Not on file  Transportation Needs:   . Lack of Transportation (Medical): Not on file  . Lack of Transportation (Non-Medical): Not on file  Physical Activity:   . Days of Exercise per Week: Not on file  . Minutes of Exercise per Session: Not on file  Stress:   . Feeling of Stress : Not on file  Social Connections:    . Frequency of Communication with Friends and Family: Not on file  . Frequency of Social Gatherings with Friends and Family: Not on file  . Attends Religious Services: Not on file  . Active Member of Clubs or Organizations: Not on file  . Attends Archivist Meetings: Not on file  . Marital Status: Not on file  Intimate Partner Violence:   . Fear of Current or Ex-Partner: Not on file  . Emotionally Abused: Not on file  . Physically Abused: Not on file  . Sexually Abused: Not on file     Allergies  Allergen Reactions  . Onion Anaphylaxis  . Other     All sea food causes swelling and a rash.    . Peanut Oil Itching  . Peanut-Containing Drug Products Itching     Outpatient Medications Prior to Visit  Medication Sig Dispense Refill  . albuterol (VENTOLIN HFA) 108 (90 Base) MCG/ACT inhaler TAKE 2 PUFFS BY MOUTH EVERY 6 HOURS AS NEEDED FOR WHEEZE OR SHORTNESS OF BREATH (Patient taking differently: Inhale 2 puffs into the lungs every 6 (six) hours as needed for wheezing or shortness of breath. ) 6.7 g 1  . ALPRAZolam (XANAX) 1 MG tablet Take 1 tablet (1 mg total) by mouth every 4 (four) hours as needed for anxiety. 30 tablet 0  . celecoxib (CELEBREX) 100 MG capsule Take 1 capsule (100 mg total) by mouth 2 (two) times daily. 60 capsule 1  . dexamethasone (DECADRON) 2 MG tablet Take 1 tablet (2 mg total) by mouth daily. 30 tablet 3  . DULoxetine (CYMBALTA) 30 MG capsule Take 1 capsule (30 mg total) by mouth daily. 30 capsule 3  . feeding supplement, ENSURE ENLIVE, (ENSURE ENLIVE) LIQD Take 237 mLs by mouth 2 (two) times daily between meals. 237 mL 12  . fentaNYL (DURAGESIC) 50 MCG/HR Place 1 patch onto the skin every other day. 5 patch 0  . folic acid (FOLVITE) 1 MG tablet Take 1 tablet (1 mg total) by mouth daily. 60 tablet 3  . ibuprofen (IBU) 800 MG tablet Take 1 tablet (800 mg total) by mouth in the morning and at bedtime. (Patient not taking: Reported on 11/02/2019) 30 tablet 0   . lidocaine-prilocaine (EMLA) cream Apply 1 application topically as needed. (Patient taking differently: Apply 1 application topically as needed (access port). ) 30 g 2  . magnesium oxide (MAG-OX) 400 (241.3 Mg) MG tablet Take 2 tablets (800 mg total) by mouth daily. 30 tablet 3  . metoprolol succinate (TOPROL XL) 50 MG 24 hr tablet Take 1 tablet (50 mg total) by  mouth daily. Take with or immediately following a meal. 30 tablet 11  . ondansetron (ZOFRAN) 4 MG tablet Take 1 tablet (4 mg total) by mouth every 8 (eight) hours as needed for nausea or vomiting. 60 tablet 0  . Oxycodone HCl 20 MG TABS Take 1-2 tablets (20-40 mg total) by mouth every 6 (six) hours as needed (Breakthrough pain). 60 tablet 0  . pantoprazole (PROTONIX) 40 MG tablet Take 1 tablet (40 mg total) by mouth 2 (two) times daily. 60 tablet 3  . polyethylene glycol (MIRALAX / GLYCOLAX) 17 g packet Take 17 g by mouth daily as needed for moderate constipation. 14 each 0  . polyvinyl alcohol (LIQUIFILM TEARS) 1.4 % ophthalmic solution Place 1 drop into both eyes as needed for dry eyes. 15 mL 0  . rivaroxaban (XARELTO) 20 MG TABS tablet Take 1 tablet (20 mg total) by mouth daily with supper. 90 tablet 1  . senna-docusate (SENOKOT-S) 8.6-50 MG tablet Take 2 tablets by mouth at bedtime.    . traZODone (DESYREL) 100 MG tablet Take 1 tablet (100 mg total) by mouth at bedtime. 30 tablet 0   No facility-administered medications prior to visit.     Review of Systems  Constitutional: Positive for activity change, appetite change, fatigue and unexpected weight change.  HENT: Negative for congestion, dental problem, drooling, ear discharge, facial swelling, hearing loss, nosebleeds, postnasal drip, rhinorrhea, sinus pressure, sinus pain, sore throat and voice change.   Eyes: Positive for visual disturbance.  Respiratory: Positive for cough and shortness of breath.   Cardiovascular: Positive for chest pain.  Gastrointestinal: Positive for  constipation, nausea and vomiting.  Endocrine: Negative.   Genitourinary: Negative.   Musculoskeletal: Positive for back pain, joint swelling and neck pain.  Skin: Negative.   Neurological: Positive for dizziness, weakness and headaches. Negative for tremors, seizures, syncope, facial asymmetry, speech difficulty, light-headedness and numbness.  Hematological: Negative for adenopathy. Does not bruise/bleed easily.  Psychiatric/Behavioral: Positive for decreased concentration, dysphoric mood and sleep disturbance. Negative for behavioral problems, confusion, self-injury and suicidal ideas. The patient is nervous/anxious.        Objective:   Physical Exam There were no vitals filed for this visit.  Gen: Depressed , well-nourished, in no distress,    ENT: No lesions,  mouth clear,  oropharynx clear, no postnasal drip  Neck: No JVD, no TMG, no carotid bruits  Lungs: No use of accessory muscles, no dullness to percussion, distant breath sounds  Cardiovascular: RRR, heart sounds normal, no murmur or gallops, no peripheral edema  Abdomen: soft and NT, no HSM,  BS normal  Musculoskeletal: No deformities, no cyanosis or clubbing, significant tenderness in the lumbar area  Neuro: alert, non focal, persistent tremor right foot  Skin: Warm, no lesions or rashes  BMP Latest Ref Rng & Units 11/02/2019 10/27/2019 10/26/2019  Glucose 70 - 99 mg/dL 154(H) 131(H) 122(H)  BUN 6 - 20 mg/dL 17 15 12   Creatinine 0.61 - 1.24 mg/dL 0.91 0.88 0.82  Sodium 135 - 145 mmol/L 142 145 144  Potassium 3.5 - 5.1 mmol/L 3.9 3.8 3.3(L)  Chloride 98 - 111 mmol/L 106 110 109  CO2 22 - 32 mmol/L 29 25 25   Calcium 8.9 - 10.3 mg/dL 9.3 8.4(L) 8.3(L)   Hepatic Function Latest Ref Rng & Units 11/02/2019 10/27/2019 10/26/2019  Total Protein 6.5 - 8.1 g/dL 7.1 5.9(L) 5.9(L)  Albumin 3.5 - 5.0 g/dL 3.3(L) 2.7(L) 2.8(L)  AST 15 - 41 U/L 87(H) 91(H) 91(H)  ALT  0 - 44 U/L 144(H) 106(H) 96(H)  Alk Phosphatase 38 - 126 U/L  467(H) 353(H) 344(H)  Total Bilirubin 0.3 - 1.2 mg/dL 0.6 0.5 0.5  Bilirubin, Direct 0.0 - 0.2 mg/dL - - -   CBC Latest Ref Rng & Units 11/02/2019 10/28/2019 10/27/2019  WBC 4.0 - 10.5 K/uL 7.1 6.9 6.4  Hemoglobin 13.0 - 17.0 g/dL 10.2(L) 9.1(L) 8.7(L)  Hematocrit 39 - 52 % 34.7(L) 29.7(L) 29.0(L)  Platelets 150 - 400 K/uL 147(L) 188 162   All images and [place] link were reviewed      Assessment & Plan:  I personally reviewed all images and lab data in the [facility] system as well as any outside material available during this office visit and agree with the  radiology impressions.   No problem-specific Assessment & Plan notes found for this encounter.   There are no diagnoses linked to this encounter.

## 2019-11-14 ENCOUNTER — Ambulatory Visit: Payer: Medicaid Other

## 2019-11-14 ENCOUNTER — Inpatient Hospital Stay (HOSPITAL_BASED_OUTPATIENT_CLINIC_OR_DEPARTMENT_OTHER): Payer: Medicaid Other | Admitting: Internal Medicine

## 2019-11-14 ENCOUNTER — Ambulatory Visit
Admission: RE | Admit: 2019-11-14 | Discharge: 2019-11-14 | Disposition: A | Discharge status: Home / Self Care | Payer: Medicaid Other | Source: Ambulatory Visit | Attending: Radiation Oncology | Admitting: Radiation Oncology

## 2019-11-14 ENCOUNTER — Other Ambulatory Visit: Payer: Self-pay

## 2019-11-14 ENCOUNTER — Ambulatory Visit (HOSPITAL_BASED_OUTPATIENT_CLINIC_OR_DEPARTMENT_OTHER): Payer: Self-pay | Admitting: Critical Care Medicine

## 2019-11-14 ENCOUNTER — Encounter: Payer: Self-pay | Admitting: Internal Medicine

## 2019-11-14 VITALS — BP 131/86 | HR 113 | Temp 97.7°F | Resp 18 | Ht 67.0 in | Wt 205.9 lb

## 2019-11-14 DIAGNOSIS — Z510 Encounter for antineoplastic radiation therapy: Secondary | ICD-10-CM | POA: Diagnosis not present

## 2019-11-14 DIAGNOSIS — C7949 Secondary malignant neoplasm of other parts of nervous system: Secondary | ICD-10-CM

## 2019-11-14 DIAGNOSIS — C7931 Secondary malignant neoplasm of brain: Secondary | ICD-10-CM

## 2019-11-14 NOTE — Progress Notes (Signed)
[facility] [place], [place] [address] [phone]   Interval Evaluation  Date of Service: 11/14/19 Patient Name: William Ali Patient MRN: [id number] Patient DOB: December 09, 1985 Provider: [name], MD  Identifying Statement:  William Ali is a 34 y.o. male with brain metastases  CNS Oncologic History:  Oncology History  Metastatic adenocarcinoma to brain [facility])  05/21/2019 Initial Diagnosis   Metastatic adenocarcinoma to brain ([place])   06/23/2019 -  Chemotherapy   The patient had palonosetron (ALOXI) injection 0.25 mg, 0.25 mg, Intravenous,  Once, 6 of 10 cycles Administration: 0.25 mg (06/23/2019), 0.25 mg (08/04/2019), 0.25 mg (08/24/2019), 0.25 mg (07/13/2019), 0.25 mg (09/14/2019) PEMEtrexed (ALIMTA) 1,100 mg in sodium chloride 0.9 % 100 mL chemo infusion, 500 mg/m2 = 1,100 mg, Intravenous,  Once, 6 of 10 cycles Administration: 1,100 mg (06/23/2019), 1,100 mg (08/04/2019), 1,100 mg (08/24/2019), 1,100 mg (10/09/2019), 1,100 mg (07/13/2019), 1,100 mg (09/14/2019) CARBOplatin (PARAPLATIN) 730 mg in sodium chloride 0.9 % 250 mL chemo infusion, 730 mg (115.3 % of original dose 629.5 mg), Intravenous,  Once, 5 of 5 cycles Dose modification:   (original dose 629.5 mg, Cycle 1) Administration: 730 mg (06/23/2019), 710 mg (08/04/2019), 710 mg (08/24/2019), 710 mg (07/13/2019), 620 mg (09/14/2019) fosaprepitant (EMEND) 150 mg in sodium chloride 0.9 % 145 mL IVPB, 150 mg, Intravenous,  Once, 6 of 10 cycles Administration: 150 mg (06/23/2019), 150 mg (08/04/2019), 150 mg (08/24/2019), 150 mg (07/13/2019), 150 mg (09/14/2019) pembrolizumab (KEYTRUDA) 200 mg in sodium chloride 0.9 % 50 mL chemo infusion, 200 mg, Intravenous, Once, 5 of 9 cycles Administration: 200 mg (08/04/2019), 200 mg (08/24/2019), 200 mg (10/09/2019), 200 mg (07/13/2019), 200 mg (09/14/2019)  for chemotherapy treatment.    Adenocarcinoma of lung ([place])  05/21/2019 Initial Diagnosis    Adenocarcinoma of lung ([place])   06/23/2019 -  Chemotherapy   The patient had palonosetron (ALOXI) injection 0.25 mg, 0.25 mg, Intravenous,  Once, 6 of 10 cycles Administration: 0.25 mg (06/23/2019), 0.25 mg (08/04/2019), 0.25 mg (08/24/2019), 0.25 mg (07/13/2019), 0.25 mg (09/14/2019) PEMEtrexed (ALIMTA) 1,100 mg in sodium chloride 0.9 % 100 mL chemo infusion, 500 mg/m2 = 1,100 mg, Intravenous,  Once, 6 of 10 cycles Administration: 1,100 mg (06/23/2019), 1,100 mg (08/04/2019), 1,100 mg (08/24/2019), 1,100 mg (10/09/2019), 1,100 mg (07/13/2019), 1,100 mg (09/14/2019) CARBOplatin (PARAPLATIN) 730 mg in sodium chloride 0.9 % 250 mL chemo infusion, 730 mg (115.3 % of original dose 629.5 mg), Intravenous,  Once, 5 of 5 cycles Dose modification:   (original dose 629.5 mg, Cycle 1) Administration: 730 mg (06/23/2019), 710 mg (08/04/2019), 710 mg (08/24/2019), 710 mg (07/13/2019), 620 mg (09/14/2019) fosaprepitant (EMEND) 150 mg in sodium chloride 0.9 % 145 mL IVPB, 150 mg, Intravenous,  Once, 6 of 10 cycles Administration: 150 mg (06/23/2019), 150 mg (08/04/2019), 150 mg (08/24/2019), 150 mg (07/13/2019), 150 mg (09/14/2019) pembrolizumab (KEYTRUDA) 200 mg in sodium chloride 0.9 % 50 mL chemo infusion, 200 mg, Intravenous, Once, 5 of 9 cycles Administration: 200 mg (08/04/2019), 200 mg (08/24/2019), 200 mg (10/09/2019), 200 mg (07/13/2019), 200 mg (09/14/2019)  for chemotherapy treatment.     CNS Oncologic History 06/21/19: Completes WBRT, additional SRS to lumbar and sacral spine William Ali)  Interval History:  William Ali presents to clinic today after recent MRI brain.  Following prolonged hospitalization for colitis and sepsis, he feels significantly improved.  He ambulates independently around the home and has even started driving recently.  His back pain is better  controlled with recent lumbar spine radiation, which he is scheduled to complete tomorrow.  Occasionally has headaches, but nothing severe.  Continues on 58m decadron  daily.  Manages cancer related pain with dilaudid and PRN fentanyl patch.  H+P (05/23/19) Patient presents to clinic today after lung cancer staging workup uncovered multiple brain metastases.  He complains of pain and tingling affecting his left leg, with symptoms moving down back of leg from lower back.  This may improve with change in position.  He also describes significant visual alteration affecting his left eye.  This is currently being evaluated by [facility] ophthalmologist, etiology is unclear.  He also describes headaches "in temples" which occur daily when exposed to a lot of light.  Symptoms resolve with covering of left eye or darkening of room.  Currently taking 849mdaily decadron. Otherwise, he is walking and talking normally, not currently working, taking care of his two children at home (10 and 7).     Medications: Current Outpatient Medications on File Prior to Visit  Medication Sig Dispense Refill  . ALPRAZolam (XANAX) 1 MG tablet Take 1 tablet (1 mg total) by mouth every 4 (four) hours as needed for anxiety. 30 tablet 0  . celecoxib (CELEBREX) 100 MG capsule Take 1 capsule (100 mg total) by mouth 2 (two) times daily. 60 capsule 1  . dexamethasone (DECADRON) 2 MG tablet Take 1 tablet (2 mg total) by mouth daily. 30 tablet 3  . DULoxetine (CYMBALTA) 30 MG capsule Take 1 capsule (30 mg total) by mouth daily. 30 capsule 3  . fentaNYL (DURAGESIC) 50 MCG/HR Place 1 patch onto the skin every other day. 5 patch 0  . folic acid (FOLVITE) 1 MG tablet Take 1 tablet (1 mg total) by mouth daily. 60 tablet 3  . lidocaine-prilocaine (EMLA) cream Apply 1 application topically as needed. (Patient taking differently: Apply 1 application topically as needed (access port). ) 30 g 2  . magnesium oxide (MAG-OX) 400 (241.3 Mg) MG tablet Take 2 tablets (800 mg total) by mouth daily. 30 tablet 3  . metoprolol succinate (TOPROL XL) 50 MG 24 hr tablet Take 1 tablet (50 mg total) by mouth daily. Take with or  immediately following a meal. 30 tablet 11  . ondansetron (ZOFRAN) 4 MG tablet Take 1 tablet (4 mg total) by mouth every 8 (eight) hours as needed for nausea or vomiting. 60 tablet 0  . Oxycodone HCl 20 MG TABS Take 1-2 tablets (20-40 mg total) by mouth every 6 (six) hours as needed (Breakthrough pain). 60 tablet 0  . pantoprazole (PROTONIX) 40 MG tablet Take 1 tablet (40 mg total) by mouth 2 (two) times daily. 60 tablet 3  . polyvinyl alcohol (LIQUIFILM TEARS) 1.4 % ophthalmic solution Place 1 drop into both eyes as needed for dry eyes. 15 mL 0  . rivaroxaban (XARELTO) 20 MG TABS tablet Take 1 tablet (20 mg total) by mouth daily with supper. 90 tablet 1  . traZODone (DESYREL) 100 MG tablet Take 1 tablet (100 mg total) by mouth at bedtime. 30 tablet 0  . albuterol (VENTOLIN HFA) 108 (90 Base) MCG/ACT inhaler TAKE 2 PUFFS BY MOUTH EVERY 6 HOURS AS NEEDED FOR WHEEZE OR SHORTNESS OF BREATH (Patient not taking: Reported on 11/14/2019) 6.7 g 1  . feeding supplement, ENSURE ENLIVE, (ENSURE ENLIVE) LIQD Take 237 mLs by mouth 2 (two) times daily between meals. (Patient not taking: Reported on 11/14/2019) 237 mL 12  . ibuprofen (IBU) 800 MG tablet Take 1 tablet (  800 mg total) by mouth in the morning and at bedtime. (Patient not taking: Reported on 11/02/2019) 30 tablet 0  . polyethylene glycol (MIRALAX / GLYCOLAX) 17 g packet Take 17 g by mouth daily as needed for moderate constipation. (Patient not taking: Reported on 11/14/2019) 14 each 0  . senna-docusate (SENOKOT-S) 8.6-50 MG tablet Take 2 tablets by mouth at bedtime. (Patient not taking: Reported on 11/14/2019)    . [DISCONTINUED] amLODipine (NORVASC) 5 MG tablet Take 1 tablet (5 mg total) by mouth daily. 30 tablet 5  . [DISCONTINUED] famotidine (PEPCID) 20 MG tablet Take 1 tablet (20 mg total) by mouth 2 (two) times daily. (Patient taking differently: Take 20 mg by mouth 2 (two) times daily as needed for heartburn. ) 30 tablet 6   No current  facility-administered medications on file prior to visit.    Allergies:  Allergies  Allergen Reactions  . Onion Anaphylaxis  . Other     All sea food causes swelling and a rash.    . Peanut Oil Itching  . Peanut-Containing Drug Products Itching   Past Medical History:  Past Medical History:  Diagnosis Date  . Back pain   . Hypertension   . 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 IR IMAGING GUIDED PORT INSERTION  06/12/2019  . KNEE SURGERY     Social History:  Social History   Socioeconomic History  . Marital status: Married    Spouse name: Not on file  . Number of children: Not on file  . Years of education: Not on file  . Highest education level: Not on file  Occupational History  . Not on file  Tobacco Use  . Smoking status: Never Smoker  . Smokeless tobacco: Never Used  Vaping Use  . Vaping Use: Never used  Substance and Sexual Activity  . Alcohol use: No  . Drug use: No  . Sexual activity: Yes  Other Topics Concern  . Not on file  Social History Narrative  . Not on file   Social Determinants of Health   Financial Resource Strain:   . Difficulty of Paying Living Expenses: Not on file  Food Insecurity:   . Worried About Charity fundraiser in the Last Year: Not on file  . Ran Out of Food in the Last Year: Not on file  Transportation Needs:   . Lack of Transportation (Medical): Not on file  . Lack of Transportation (Non-Medical): Not on file  Physical Activity:   . Days of Exercise per Week: Not on file  . Minutes of Exercise per Session: Not on file  Stress:   . Feeling of Stress : Not on file  Social Connections:   . Frequency of Communication with Friends and Family: Not on file  . Frequency of Social Gatherings with Friends and Family: Not on file  . Attends Religious Services: Not on file  . Active Member of Clubs or Organizations: Not on file  . Attends  Archivist Meetings: Not on file  . Marital Status: Not on file  Intimate Partner Violence:   . Fear of Current or Ex-Partner: Not on file  . Emotionally Abused: Not on file  . Physically Abused: Not on file  . Sexually Abused: Not on file   Family History:  Family History  Problem Relation Age of Onset  . Hypertension Mother   . Diabetes Father  Review of Systems: Constitutional: Doesn't report fevers, chills or abnormal weight loss Eyes: Doesn't report blurriness of vision Ears, nose, mouth, throat, and face: Doesn't report sore throat Respiratory: Doesn't report cough, dyspnea or wheezes Cardiovascular: Doesn't report palpitation, chest discomfort  Gastrointestinal:  Doesn't report nausea, constipation, diarrhea GU: Doesn't report incontinence Skin: Doesn't report skin rashes Neurological: Per HPI Musculoskeletal: Doesn't report joint pain Behavioral/Psych: Doesn't report anxiety  Physical Exam: Vitals:   11/14/19 1345  BP: 131/86  Pulse: (!) 113  Resp: 18  Temp: 97.7 F (36.5 C)  SpO2: 100%   KPS: 70. General: Cushingoid appearance Head: Normal EENT: No conjunctival injection or scleral icterus.  Lungs: Resp effort normal Cardiac: Regular rate Abdomen: Non-distended abdomen Skin: No rashes cyanosis or petechiae. Extremities: No clubbing or edema  Neurologic Exam: Mental Status: Awake, alert, attentive to examiner. Oriented to self and environment. Language is fluent with intact comprehension.  Cranial Nerves: Visual acuity is impaired in left eye, but visual fields are full. Extra-ocular movements intact. No ptosis. Face is symmetric Motor: Tone and bulk are normal. Arms and legs antigravity. Reflexes are symmetric, no pathologic reflexes present.  Sensory: Decreased upper left leg Gait: Deferred   Labs: I have reviewed the data as listed    Component Value Date/Time   NA 142 11/02/2019 1510   K 3.9 11/02/2019 1510   CL 106 11/02/2019  1510   CO2 29 11/02/2019 1510   GLUCOSE 154 (H) 11/02/2019 1510   BUN 17 11/02/2019 1510   CREATININE 0.91 11/02/2019 1510   CALCIUM 9.3 11/02/2019 1510   PROT 7.1 11/02/2019 1510   ALBUMIN 3.3 (L) 11/02/2019 1510   AST 87 (H) 11/02/2019 1510   ALT 144 (H) 11/02/2019 1510   ALKPHOS 467 (H) 11/02/2019 1510   BILITOT 0.6 11/02/2019 1510   GFRNONAA >60 11/02/2019 1510   GFRAA >60 10/27/2019 0441   GFRAA >60 10/09/2019 1133   Lab Results  Component Value Date   WBC 7.1 11/02/2019   NEUTROABS 5.8 11/02/2019   HGB 10.2 (L) 11/02/2019   HCT 34.7 (L) 11/02/2019   MCV 94.0 11/02/2019   PLT 147 (L) 11/02/2019   Imaging:  [place] Clinician Interpretation: I have personally reviewed the CNS images as listed.  My interpretation, in the context of the patient's clinical presentation, is stable disease  MR BRAIN W WO CONTRAST  Addendum Date: 11/10/2019   ADDENDUM REPORT: 11/10/2019 12:59 ADDENDUM: Contrast was administered via a port which was accessed by a registered nurse. Electronically Signed   By: Macy Mis M.D.   On: 11/10/2019 12:59   Result Date: 11/10/2019 CLINICAL DATA:  Metastatic lung cancer EXAM: MRI HEAD WITHOUT AND WITH CONTRAST TECHNIQUE: Multiplanar, multiecho pulse sequences of the brain and surrounding structures were obtained without and with intravenous contrast. CONTRAST:  60m MULTIHANCE GADOBENATE DIMEGLUMINE 529 MG/ML IV SOLN COMPARISON:  09/06/2019 FINDINGS: Brain: Innumerable metastatic lesions are again identified and are stable to slightly decreased in size. For example, a left frontoparietal lesion on series 11, image 117 measures up to 5 mm (previously 10 mm). No new lesion identified. No significant edema. Susceptibility is present association with many of the metastases reflecting hemorrhage or mineralization likely related to treatment. A punctate focus cortical diffusion hyperintensity the left frontal lobe without enhancement or susceptibility probably  reflects an acute to subacute infarct (series 3, image 73). A similar punctate focus is present in the right frontal lobe on the same slice. Patchy T2 hyperintensity in the supratentorial white matter  likely reflects therapy related changes. Vascular: Major vessel flow voids at the skull base are preserved. Skull and upper cervical spine: Decreased T1 marrow signal reflecting metastatic disease involving the skull base and upper cervical spine. Sinuses/Orbits: Paranasal sinus mucosal thickening. Chronic sphenoid sinusitis. Orbits are unremarkable. Other: Sella is unremarkable.  Patchy mastoid fluid opacification. IMPRESSION: Stable to slightly decreased size innumerable parenchymal metastatic lesions. Persistent evidence of osseous metastatic disease. Suspected two punctate acute to subacute infarcts of the frontal lobes. These results were called by telephone at the time of interpretation on 11/09/2019 at 3:37 pm to provider William Ali , who verbally acknowledged these results. Electronically Signed: By: Macy Mis M.D. On: 11/09/2019 15:37   CT ABDOMEN PELVIS W CONTRAST  Result Date: 10/19/2019 CLINICAL DATA:  Lower abdominal pain EXAM: CT ABDOMEN AND PELVIS WITH CONTRAST TECHNIQUE: Multidetector CT imaging of the abdomen and pelvis was performed using the standard protocol following bolus administration of intravenous contrast. CONTRAST:  181m OMNIPAQUE IOHEXOL 300 MG/ML  SOLN COMPARISON:  CT from 10/12/2019, plain film from earlier in the same day. FINDINGS: Lower chest: Bilateral pleural effusions are noted right greater than left. Associated basilar atelectatic changes are noted. Hepatobiliary: Liver demonstrates multiple ring-enhancing lesions consistent with metastatic disease similar to that noted on the prior exam. Gallbladder is within normal limits. Fatty infiltration of the liver is seen as well. Pancreas: Unremarkable. No pancreatic ductal dilatation or surrounding inflammatory changes.  Spleen: Normal in size without focal abnormality. Adrenals/Urinary Tract: Adrenal glands are within normal limits. Kidneys demonstrate a normal enhancement pattern bilaterally. No renal calculi are noted. Small cyst is noted within the right kidney. Bladder is within normal limits. Stomach/Bowel: The appendix is within normal limits. No obstructive or inflammatory changes of the colon are noted. Stomach is decompressed. Small bowel is within normal limits. Vascular/Lymphatic: No significant vascular findings are present. No enlarged abdominal or pelvic lymph nodes. Reproductive: Prostate is unremarkable. Other: No abdominal wall hernia or abnormality. No abdominopelvic ascites. Musculoskeletal: Sclerotic foci are noted throughout the visualized bony structures consistent with metastatic disease. This is stable from the prior exam. IMPRESSION: Stable hepatic and bony metastatic disease. Fatty liver. No other focal abnormality is noted. Electronically Signed   By: MInez CatalinaM.D.   On: 10/19/2019 22:58   DG CHEST PORT 1 VIEW  Result Date: 10/19/2019 CLINICAL DATA:  Pleuritic chest pain. History of metastatic lung cancer. EXAM: PORTABLE CHEST 1 VIEW COMPARISON:  October 12, 2019. FINDINGS: Stable position of right internal jugular Port-A-Cath. Increased right paratracheal density is noted concerning for worsening adenopathy or metastatic disease. Left lung is clear. No pneumothorax or pleural effusion is noted. Bony thorax is unremarkable. IMPRESSION: Increased right paratracheal density is noted concerning for worsening adenopathy or metastatic disease. Electronically Signed   By: JMarijo ConceptionM.D.   On: 10/19/2019 13:52   DG Abd Portable 1V  Result Date: 10/19/2019 CLINICAL DATA:  Constipation. EXAM: PORTABLE ABDOMEN - 1 VIEW COMPARISON:  March 28, 2010. FINDINGS: The bowel gas pattern is normal. No significant stool burden is noted. No radio-opaque calculi or other significant radiographic abnormality  are seen. IMPRESSION: Negative. Electronically Signed   By: JMarijo ConceptionM.D.   On: 10/19/2019 13:53    Assessment/Plan Metastasis of neoplasm to spinal canal ([facility] [C79.49]  William JIsidoro Donningis clinically stable today.  MRI demonstrates very good control of metastatic burden.  There are two tiny foci of restricted diffusion of unclear etiology; based on size and location  they could be tiny metastases or radiation treatment related changes.  Also possible is small infarcts, although he has been anticoagulated throughout and has no other risk factors for stroke aside from cancer/hypercoagulable.  We would recommend continuing anticoagulation and following clinically and with additional serial CNS imaging.  We recommended decreasing decadron to 81m daily if tolerated.  He does feel it helps his appetite and fatigue somewhat.  We again encouraged activity and engagement socially, physically.    We appreciate the opportunity to participate in the care of DSpencer   We ask that William Ali to clinic in 3 months following next brain MRI, or sooner as needed.  All questions were answered. The patient knows to call the clinic with any problems, questions or concerns. No barriers to learning were detected.  I have spent a total of 40 minutes of face-to-face and non-face-to-face time, excluding clinical staff time, preparing to see patient, ordering tests and/or medications, counseling the patient, and independently interpreting results and communicating results to the patient/family/caregiver    ZVentura Sellers MD Medical Director of Neuro-Oncology [facility] [place]10/19/21 2:31 PM

## 2019-11-15 ENCOUNTER — Encounter: Payer: Self-pay | Admitting: Radiation Oncology

## 2019-11-15 ENCOUNTER — Other Ambulatory Visit: Payer: Self-pay

## 2019-11-15 ENCOUNTER — Ambulatory Visit
Admission: RE | Admit: 2019-11-15 | Discharge: 2019-11-15 | Disposition: A | Discharge status: Home / Self Care | Payer: Medicaid Other | Source: Ambulatory Visit | Attending: Radiation Oncology | Admitting: Radiation Oncology

## 2019-11-15 ENCOUNTER — Encounter: Payer: Self-pay | Admitting: Critical Care Medicine

## 2019-11-15 DIAGNOSIS — Z510 Encounter for antineoplastic radiation therapy: Secondary | ICD-10-CM | POA: Diagnosis not present

## 2019-11-15 NOTE — Progress Notes (Signed)
Patient ID: William Ali, male   DOB: 06-02-1985, 34 y.o.   MRN: [id number] Pt canceled appt

## 2019-11-16 ENCOUNTER — Ambulatory Visit: Payer: PRIVATE HEALTH INSURANCE | Admitting: Internal Medicine

## 2019-11-18 ENCOUNTER — Other Ambulatory Visit: Payer: Self-pay | Admitting: Internal Medicine

## 2019-11-18 NOTE — Telephone Encounter (Signed)
Requested medication (s) are due for refill today: yes  Requested medication (s) are on the active medication list: yes  Last refill:  08/23/19  Future visit scheduled: no  Notes to clinic:  no refill protocol for this order   Requested Prescriptions  Pending Prescriptions Disp Refills   DULoxetine (CYMBALTA) 30 MG capsule [Pharmacy Med Name: DULOXETINE HCL DR 30 MG CAP] 30 capsule 3    Sig: TAKE 1 CAPSULE BY MOUTH EVERY DAY      There is no refill protocol information for this order

## 2019-11-20 ENCOUNTER — Other Ambulatory Visit: Payer: Self-pay | Admitting: Radiation Therapy

## 2019-11-22 ENCOUNTER — Telehealth: Payer: Self-pay | Admitting: Hematology and Oncology

## 2019-11-22 ENCOUNTER — Ambulatory Visit: Payer: PRIVATE HEALTH INSURANCE

## 2019-11-22 ENCOUNTER — Other Ambulatory Visit: Payer: PRIVATE HEALTH INSURANCE

## 2019-11-22 ENCOUNTER — Ambulatory Visit: Payer: PRIVATE HEALTH INSURANCE | Admitting: Hematology and Oncology

## 2019-11-22 NOTE — Telephone Encounter (Signed)
Called and spoke with patient. Confirmed 10/28 appt

## 2019-11-23 ENCOUNTER — Inpatient Hospital Stay (HOSPITAL_BASED_OUTPATIENT_CLINIC_OR_DEPARTMENT_OTHER): Payer: Medicaid Other | Admitting: Hematology and Oncology

## 2019-11-23 ENCOUNTER — Other Ambulatory Visit: Payer: Self-pay | Admitting: Hematology and Oncology

## 2019-11-23 ENCOUNTER — Other Ambulatory Visit: Payer: Self-pay

## 2019-11-23 ENCOUNTER — Inpatient Hospital Stay: Payer: Medicaid Other | Attending: Hematology and Oncology

## 2019-11-23 ENCOUNTER — Inpatient Hospital Stay: Payer: Medicaid Other

## 2019-11-23 VITALS — BP 136/80 | HR 97 | Temp 98.0°F | Resp 18 | Wt 208.5 lb

## 2019-11-23 DIAGNOSIS — Z5111 Encounter for antineoplastic chemotherapy: Secondary | ICD-10-CM | POA: Diagnosis not present

## 2019-11-23 DIAGNOSIS — C7931 Secondary malignant neoplasm of brain: Secondary | ICD-10-CM

## 2019-11-23 DIAGNOSIS — C3491 Malignant neoplasm of unspecified part of right bronchus or lung: Secondary | ICD-10-CM | POA: Insufficient documentation

## 2019-11-23 DIAGNOSIS — Z79899 Other long term (current) drug therapy: Secondary | ICD-10-CM | POA: Diagnosis not present

## 2019-11-23 DIAGNOSIS — Z95828 Presence of other vascular implants and grafts: Secondary | ICD-10-CM

## 2019-11-23 DIAGNOSIS — C7949 Secondary malignant neoplasm of other parts of nervous system: Secondary | ICD-10-CM

## 2019-11-23 LAB — CBC WITH DIFFERENTIAL (CANCER CENTER ONLY)
Abs Immature Granulocytes: 0.13 10*3/uL — ABNORMAL HIGH (ref 0.00–0.07)
Basophils Absolute: 0 10*3/uL (ref 0.0–0.1)
Basophils Relative: 1 %
Eosinophils Absolute: 0.1 10*3/uL (ref 0.0–0.5)
Eosinophils Relative: 1 %
HCT: 33.6 % — ABNORMAL LOW (ref 39.0–52.0)
Hemoglobin: 10.3 g/dL — ABNORMAL LOW (ref 13.0–17.0)
Immature Granulocytes: 2 %
Lymphocytes Relative: 4 %
Lymphs Abs: 0.2 10*3/uL — ABNORMAL LOW (ref 0.7–4.0)
MCH: 29.1 pg (ref 26.0–34.0)
MCHC: 30.7 g/dL (ref 30.0–36.0)
MCV: 94.9 fL (ref 80.0–100.0)
Monocytes Absolute: 0.7 10*3/uL (ref 0.1–1.0)
Monocytes Relative: 13 %
Neutro Abs: 4.5 10*3/uL (ref 1.7–7.7)
Neutrophils Relative %: 79 %
Platelet Count: 72 10*3/uL — ABNORMAL LOW (ref 150–400)
RBC: 3.54 MIL/uL — ABNORMAL LOW (ref 4.22–5.81)
RDW: 21 % — ABNORMAL HIGH (ref 11.5–15.5)
WBC Count: 5.6 10*3/uL (ref 4.0–10.5)
nRBC: 0.7 % — ABNORMAL HIGH (ref 0.0–0.2)

## 2019-11-23 LAB — CMP (CANCER CENTER ONLY)
ALT: 234 U/L — ABNORMAL HIGH (ref 0–44)
AST: 98 U/L — ABNORMAL HIGH (ref 15–41)
Albumin: 3.4 g/dL — ABNORMAL LOW (ref 3.5–5.0)
Alkaline Phosphatase: 493 U/L — ABNORMAL HIGH (ref 38–126)
Anion gap: 9 (ref 5–15)
BUN: 21 mg/dL — ABNORMAL HIGH (ref 6–20)
CO2: 26 mmol/L (ref 22–32)
Calcium: 9.1 mg/dL (ref 8.9–10.3)
Chloride: 109 mmol/L (ref 98–111)
Creatinine: 0.98 mg/dL (ref 0.61–1.24)
GFR, Estimated: >60 mL/min (ref >60)
Glucose, Bld: 94 mg/dL (ref 70–99)
Potassium: 3.8 mmol/L (ref 3.5–5.1)
Sodium: 144 mmol/L (ref 135–145)
Total Bilirubin: 0.7 mg/dL (ref 0.3–1.2)
Total Protein: 6.8 g/dL (ref 6.5–8.1)

## 2019-11-23 LAB — MAGNESIUM: Magnesium: 1.7 mg/dL (ref 1.7–2.4)

## 2019-11-23 MED ORDER — HEPARIN SOD (PORK) LOCK FLUSH 100 UNIT/ML IV SOLN
500.0000 [IU] | Freq: Once | INTRAVENOUS | Status: AC | PRN
  Administered 2019-11-23: 500 [IU]
  Filled 2019-11-23: qty 5

## 2019-11-23 MED ORDER — ZOLEDRONIC ACID 4 MG/100ML IV SOLN: 4.0000 mg | Freq: Once | INTRAVENOUS | Status: DC

## 2019-11-23 MED ORDER — ZOLEDRONIC ACID 4 MG/100ML IV SOLN
4.0000 mg | Freq: Once | INTRAVENOUS | Status: DC
  Filled 2019-11-23: qty 100

## 2019-11-23 MED ORDER — PROCHLORPERAZINE MALEATE 10 MG PO TABS
10.0000 mg | ORAL_TABLET | Freq: Once | ORAL | Status: AC
  Administered 2019-11-23: 10 mg via ORAL

## 2019-11-23 MED ORDER — SODIUM CHLORIDE 0.9% FLUSH
10.0000 mL | INTRAVENOUS | Status: DC | PRN
  Administered 2019-11-23: 10 mL
  Filled 2019-11-23: qty 10

## 2019-11-23 MED ORDER — SODIUM CHLORIDE 0.9 % IV SOLN
500.0000 mg/m2 | Freq: Once | INTRAVENOUS | Status: AC
  Administered 2019-11-23: 1100 mg via INTRAVENOUS
  Filled 2019-11-23: qty 40

## 2019-11-23 MED ORDER — ZOLEDRONIC ACID 4 MG/100ML IV SOLN
INTRAVENOUS | Status: AC
  Filled 2019-11-23: qty 100

## 2019-11-23 MED ORDER — FENTANYL 50 MCG/HR TD PT72: 1.0000 | MEDICATED_PATCH | TRANSDERMAL | 0 refills | Status: DC

## 2019-11-23 MED ORDER — SODIUM CHLORIDE 0.9% FLUSH
10.0000 mL | INTRAVENOUS | Status: DC | PRN
  Administered 2019-11-23: 10 mL via INTRAVENOUS
  Filled 2019-11-23: qty 10

## 2019-11-23 MED ORDER — SODIUM CHLORIDE 0.9 % IV SOLN
200.0000 mg | Freq: Once | INTRAVENOUS | Status: AC
  Administered 2019-11-23: 200 mg via INTRAVENOUS
  Filled 2019-11-23: qty 8

## 2019-11-23 MED ORDER — ZOLEDRONIC ACID 4 MG/5ML IV CONC
4.0000 mg | Freq: Once | INTRAVENOUS | Status: DC
  Filled 2019-11-23: qty 5

## 2019-11-23 MED ORDER — SODIUM CHLORIDE 0.9 % IV SOLN
Freq: Once | INTRAVENOUS | Status: AC
  Filled 2019-11-23: qty 250

## 2019-11-23 MED ORDER — PROCHLORPERAZINE MALEATE 10 MG PO TABS
ORAL_TABLET | ORAL | Status: AC
  Filled 2019-11-23: qty 1

## 2019-11-23 NOTE — Progress Notes (Signed)
Per Dr. Lorenso Courier, okay for patient to receive treatment today with platelets 72, AST 98 and ALT 234.

## 2019-11-23 NOTE — Patient Instructions (Signed)

## 2019-11-23 NOTE — Progress Notes (Signed)
Patient tapering dexamethasone dose now down to 1 mg per patient.   Larene Beach, PharmD

## 2019-11-24 LAB — TSH: TSH: 3.161 u[IU]/mL (ref 0.320–4.118)

## 2019-11-28 ENCOUNTER — Other Ambulatory Visit: Payer: Self-pay | Admitting: Internal Medicine

## 2019-11-29 ENCOUNTER — Other Ambulatory Visit: Payer: Self-pay

## 2019-11-29 ENCOUNTER — Telehealth: Payer: Self-pay | Admitting: *Deleted

## 2019-11-29 NOTE — Telephone Encounter (Signed)
TCT pt's dentist, Dr. Debria Garret to obtain dental clearance for pt to receive IV Zometa.  Spoke with receptionist and provided her with our fax #  [phone] and the the phone # [phone]. She stated she will remind Dr. Nyoka Cowden about this

## 2019-11-30 MED ORDER — TRAZODONE HCL 100 MG PO TABS
100.0000 mg | ORAL_TABLET | Freq: Every day | ORAL | 0 refills | Status: DC
Start: 2019-11-30 — End: 2020-01-01

## 2019-12-04 ENCOUNTER — Other Ambulatory Visit: Payer: Self-pay | Admitting: *Deleted

## 2019-12-04 ENCOUNTER — Inpatient Hospital Stay: Payer: Medicaid Other | Admitting: Hematology and Oncology

## 2019-12-04 ENCOUNTER — Inpatient Hospital Stay: Payer: Medicaid Other

## 2019-12-04 ENCOUNTER — Encounter: Payer: Self-pay | Admitting: Hematology and Oncology

## 2019-12-04 DIAGNOSIS — C7931 Secondary malignant neoplasm of brain: Secondary | ICD-10-CM

## 2019-12-04 NOTE — Progress Notes (Signed)
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 Short term f/u of this lesion was recommended. CXR on 05/01/2018 performed with recommended f/u imaging. 2) 05/10/2019: presented to the ED with headache and left pelvis pain. Patient underwent CT C/A/P and MRI brain. Brain MRI showed numerous enhancing parenchymal intracranial lesions likely reflecting metastases. CT scan showed cavitary lesion in the right lung apex with spiculated margins. Additional sites of disease include lymph nodes of the chest and numerous liver mets. In the bones there were multiple lytic appearing lesions throughout the thoracic and lumbar spine as well as portions of the bony pelvis. Referred to Oncology. 3) 05/16/2019: US biopsy of the liver performed. Findings consistent with a poorly differentiated adenocarcinoma.  4) 05/19/2019: establish care with Dr. Lorenso Courier.  5) 05/30/2019: CT simulation. Received Radiation to brain/spine.  5) 06/23/2019: Received Cycle 1 Day 1 of Carbo/Pemetrexed while inpatient. Pembrolizumab not able to be administered in inpatient setting. 6) 07/13/2019:Cycle 2 Day 1 of Carboplatin/Pemetrexed/Pembrolizumab 7)  08/04/2019:Cycle 3 day 1 of Carboplatin/Pemetrexed/Pembrolizumab 8) 08/24/2019: Cycle 4 day 1 of Carboplatin/Pemetrexed/Pembrolizumab 9) 09/14/2019: Cycle 5 day 1 of Carboplatin/Pemetrexed/Pembrolizumab 10) 10/09/2019: Cycle 6 day 1 of Pemetrexed/Pembrolizumab (held Carboplatin due to cytopenias. Intended to begin holding on cycle 7) 11) Cycle 7 delayed due to the patient being admitted.  12) 11/23/2019 Cycle 7 day 1 of Pemetrexed/Pembrolizumab  Interval History:  William Ali 34 y.o. male with  medical history significant for metastatic adenocarcinoma of the lung who presents for a follow up visit.  The patient was last seen while on 11/02/2019. He presents now to restart his treatment. He was intended to restart on 11/13/2019 but due to some confusion that appointment was missed.   On exam today Mr. Brenneman notes that overall he has been well since his discharge from the hospital.  He notes that he is having more pain on the left side of his abdomen which is unusual as he typically has pain on the right side.  He reports that he is having fatigue and does feel quite tired, but is still able to perform activities and spend time with his children.  He reports that he is been having bowel movements once to twice per day and that his pain is under reasonable control with the 10 mg of ox he codon as needed with the fentanyl 50 mcg patches every 2 days.  He does continue to have some blurriness in his eyes.  He does have a cough but this is nonbloody and rare.  Overall he is willing and able to continue with treatment at this time.  He has had no issues with nausea, vomiting, or diarrhea.  He denies having any fevers, chills, sweats.  He does occasionally have symptoms of the shortness of breath and rare nonbloody cough.  He has been tolerating his Xarelto therapy well without any bleeding, or bruising.  A full 10 point ROS is listed below.  MEDICAL HISTORY:  Past Medical History:  Diagnosis Date  . Back pain   . Hypertension   . met lung ca to liver, spine, and brain dx'd 03/2019    SURGICAL HISTORY: Past Surgical History:  Procedure Laterality Date  . IR IMAGING GUIDED PORT INSERTION  06/12/2019  . KNEE SURGERY  SOCIAL HISTORY: Social History   Socioeconomic History  . Marital status: Married    Spouse name: Not on file  . Number of children: Not on file  . Years of education: Not on file  . Highest education level: Not on file  Occupational History  . Not on file  Tobacco Use   . Smoking status: Never Smoker  . Smokeless tobacco: Never Used  Vaping Use  . Vaping Use: Never used  Substance and Sexual Activity  . Alcohol use: No  . Drug use: No  . Sexual activity: Yes  Other Topics Concern  . Not on file  Social History Narrative  . Not on file   Social Determinants of Health   Financial Resource Strain:   . Difficulty of Paying Living Expenses: Not on file  Food Insecurity:   . Worried About Charity fundraiser in the Last Year: Not on file  . Ran Out of Food in the Last Year: Not on file  Transportation Needs:   . Lack of Transportation (Medical): Not on file  . Lack of Transportation (Non-Medical): Not on file  Physical Activity:   . Days of Exercise per Week: Not on file  . Minutes of Exercise per Session: Not on file  Stress:   . Feeling of Stress : Not on file  Social Connections:   . Frequency of Communication with Friends and Family: Not on file  . Frequency of Social Gatherings with Friends and Family: Not on file  . Attends Religious Services: Not on file  . Active Member of Clubs or Organizations: Not on file  . Attends Archivist Meetings: Not on file  . Marital Status: Not on file  Intimate Partner Violence:   . Fear of Current or Ex-Partner: Not on file  . Emotionally Abused: Not on file  . Physically Abused: Not on file  . Sexually Abused: Not on file    FAMILY HISTORY: Family History  Problem Relation Age of Onset  . Hypertension Mother   . Diabetes Father     ALLERGIES:  is allergic to onion, other, peanut oil, and peanut-containing drug products.  MEDICATIONS:  Current Outpatient Medications  Medication Sig Dispense Refill  . albuterol (VENTOLIN HFA) 108 (90 Base) MCG/ACT inhaler TAKE 2 PUFFS BY MOUTH EVERY 6 HOURS AS NEEDED FOR WHEEZE OR SHORTNESS OF BREATH (Patient not taking: Reported on 11/14/2019) 6.7 g 1  . ALPRAZolam (XANAX) 1 MG tablet Take 1 tablet (1 mg total) by mouth every 4 (four) hours as  needed for anxiety. 30 tablet 0  . celecoxib (CELEBREX) 100 MG capsule Take 1 capsule (100 mg total) by mouth 2 (two) times daily. 60 capsule 1  . dexamethasone (DECADRON) 2 MG tablet Take 1 tablet (2 mg total) by mouth daily. 30 tablet 3  . DULoxetine (CYMBALTA) 30 MG capsule TAKE 1 CAPSULE BY MOUTH EVERY DAY 30 capsule 0  . feeding supplement, ENSURE ENLIVE, (ENSURE ENLIVE) LIQD Take 237 mLs by mouth 2 (two) times daily between meals. (Patient not taking: Reported on 11/14/2019) 237 mL 12  . fentaNYL (DURAGESIC) 50 MCG/HR Place 1 patch onto the skin every other day. 15 patch 0  . folic acid (FOLVITE) 1 MG tablet Take 1 tablet (1 mg total) by mouth daily. 60 tablet 3  . ibuprofen (IBU) 800 MG tablet Take 1 tablet (800 mg total) by mouth in the morning and at bedtime. (Patient not taking: Reported on 11/02/2019) 30 tablet 0  . lidocaine-prilocaine (EMLA)  cream Apply 1 application topically as needed. (Patient taking differently: Apply 1 application topically as needed (access port). ) 30 g 2  . magnesium oxide (MAG-OX) 400 (241.3 Mg) MG tablet Take 2 tablets (800 mg total) by mouth daily. 30 tablet 3  . metoprolol succinate (TOPROL XL) 50 MG 24 hr tablet Take 1 tablet (50 mg total) by mouth daily. Take with or immediately following a meal. 30 tablet 11  . ondansetron (ZOFRAN) 4 MG tablet Take 1 tablet (4 mg total) by mouth every 8 (eight) hours as needed for nausea or vomiting. 60 tablet 0  . Oxycodone HCl 20 MG TABS Take 1-2 tablets (20-40 mg total) by mouth every 6 (six) hours as needed (Breakthrough pain). 60 tablet 0  . pantoprazole (PROTONIX) 40 MG tablet Take 1 tablet (40 mg total) by mouth 2 (two) times daily. 60 tablet 3  . polyethylene glycol (MIRALAX / GLYCOLAX) 17 g packet Take 17 g by mouth daily as needed for moderate constipation. (Patient not taking: Reported on 11/14/2019) 14 each 0  . polyvinyl alcohol (LIQUIFILM TEARS) 1.4 % ophthalmic solution Place 1 drop into both eyes as needed  for dry eyes. 15 mL 0  . rivaroxaban (XARELTO) 20 MG TABS tablet Take 1 tablet (20 mg total) by mouth daily with supper. 90 tablet 1  . senna-docusate (SENOKOT-S) 8.6-50 MG tablet Take 2 tablets by mouth at bedtime. (Patient not taking: Reported on 11/14/2019)    . traZODone (DESYREL) 100 MG tablet Take 1 tablet (100 mg total) by mouth at bedtime. 30 tablet 0   No current facility-administered medications for this visit.    REVIEW OF SYSTEMS:   Constitutional: ( - ) fevers, ( - )  chills , ( - ) night sweats Eyes: ( - ) blurriness of vision, ( - ) double vision, ( - ) watery eyes Ears, nose, mouth, throat, and face: ( - ) mucositis, ( - ) sore throat Respiratory: ( - ) cough, ( - ) dyspnea, ( - ) wheezes Cardiovascular: ( - ) palpitation, ( - ) chest discomfort, ( - ) lower extremity swelling Gastrointestinal:  ( - ) nausea, ( - ) heartburn, ( - ) change in bowel habits Skin: ( - ) abnormal skin rashes Lymphatics: ( - ) new lymphadenopathy, ( - ) easy bruising Neurological: ( - ) numbness, ( - ) tingling, ( - ) new weaknesses Behavioral/Psych: ( - ) mood change, ( - ) new changes  All other systems were reviewed with the patient and are negative.  PHYSICAL EXAMINATION: ECOG PERFORMANCE STATUS: 2 - Symptomatic, <50% confined to bed  There were no vitals filed for this visit. There were no vitals filed for this visit.  GENERAL: well appearing young African American male in NAD  SKIN: skin color, texture, turgor are normal, no rashes or significant lesions NECK: supple, nontender LYMPH: fullness of the cervical lymph nodes bilaterally. No clear axillary lymph nodes. Stable from prior EYES: conjunctiva are pink and non-injected, sclera clear LUNGS: clear to auscultation and percussion with normal breathing effort HEART: regular rate & rhythm and no murmurs and no lower extremity edema Musculoskeletal: no cyanosis of digits and no clubbing. Increased swelling inferior to axilla  bilaterally. Also noted to have soft tissue swelling in left leg  PSYCH: alert & oriented x 3, fluent speech NEURO: no focal motor/sensory deficits  LABORATORY DATA:  I have reviewed the data as listed CBC Latest Ref Rng & Units 11/23/2019 11/02/2019 10/28/2019  WBC 4.0 -  10.5 K/uL 5.6 7.1 6.9  Hemoglobin 13.0 - 17.0 g/dL 10.3(L) 10.2(L) 9.1(L)  Hematocrit 39 - 52 % 33.6(L) 34.7(L) 29.7(L)  Platelets 150 - 400 K/uL 72(L) 147(L) 188    CMP Latest Ref Rng & Units 11/23/2019 11/02/2019 10/27/2019  Glucose 70 - 99 mg/dL 94 154(H) 131(H)  BUN 6 - 20 mg/dL 21(H) 17 15  Creatinine 0.61 - 1.24 mg/dL 0.98 0.91 0.88  Sodium 135 - 145 mmol/L 144 142 145  Potassium 3.5 - 5.1 mmol/L 3.8 3.9 3.8  Chloride 98 - 111 mmol/L 109 106 110  CO2 22 - 32 mmol/L 26 29 25   Calcium 8.9 - 10.3 mg/dL 9.1 9.3 8.4(L)  Total Protein 6.5 - 8.1 g/dL 6.8 7.1 5.9(L)  Total Bilirubin 0.3 - 1.2 mg/dL 0.7 0.6 0.5  Alkaline Phos 38 - 126 U/L 493(H) 467(H) 353(H)  AST 15 - 41 U/L 98(H) 87(H) 91(H)  ALT 0 - 44 U/L 234(H) 144(H) 106(H)    RADIOGRAPHIC STUDIES: MR BRAIN W WO CONTRAST  Addendum Date: 11/10/2019   ADDENDUM REPORT: 11/10/2019 12:59 ADDENDUM: Contrast was administered via a port which was accessed by a registered nurse. Electronically Signed   By: Macy Mis M.D.   On: 11/10/2019 12:59   Result Date: 11/10/2019 CLINICAL DATA:  Metastatic lung cancer EXAM: MRI HEAD WITHOUT AND WITH CONTRAST TECHNIQUE: Multiplanar, multiecho pulse sequences of the brain and surrounding structures were obtained without and with intravenous contrast. CONTRAST:  32m MULTIHANCE GADOBENATE DIMEGLUMINE 529 MG/ML IV SOLN COMPARISON:  09/06/2019 FINDINGS: Brain: Innumerable metastatic lesions are again identified and are stable to slightly decreased in size. For example, a left frontoparietal lesion on series 11, image 117 measures up to 5 mm (previously 10 mm). No new lesion identified. No significant edema. Susceptibility is present  association with many of the metastases reflecting hemorrhage or mineralization likely related to treatment. A punctate focus cortical diffusion hyperintensity the left frontal lobe without enhancement or susceptibility probably reflects an acute to subacute infarct (series 3, image 73). A similar punctate focus is present in the right frontal lobe on the same slice. Patchy T2 hyperintensity in the supratentorial white matter likely reflects therapy related changes. Vascular: Major vessel flow voids at the skull base are preserved. Skull and upper cervical spine: Decreased T1 marrow signal reflecting metastatic disease involving the skull base and upper cervical spine. Sinuses/Orbits: Paranasal sinus mucosal thickening. Chronic sphenoid sinusitis. Orbits are unremarkable. Other: Sella is unremarkable.  Patchy mastoid fluid opacification. IMPRESSION: Stable to slightly decreased size innumerable parenchymal metastatic lesions. Persistent evidence of osseous metastatic disease. Suspected two punctate acute to subacute infarcts of the frontal lobes. These results were called by telephone at the time of interpretation on 11/09/2019 at 3:37 pm to [facility] , who verbally acknowledged these results. Electronically Signed: By: PMacy MisM.D. On: 11/09/2019 15:37    ASSESSMENT & PLAN Cl[place] JIsidoro Donning89y.o. male with medical history significant for metastatic adenocarcinoma of the lung who presents for a follow up visit prior to Cycle 7 of chemotherapy.   The patient successfully completed Cycle 1 of carboplatin and pemetrexed, started on 06/23/2019.  Unfortunately this had to be administered in the inpatient setting because the patient was admitted for pain control.  As we were unsure when he would be discharged we decided to move forward with the treatment at that time.  Pembrolizumab was not able to be administered in house due to logistical and insurance issues.  He received pembrolizumab during  Cycle 2 which started on 07/13/2019.  On exam today Mr. Woehler is at his baseline level of health.  He continues to tolerate chemotherapy well with minimal side effects.  His pain is currently under reasonable amount of control with the adjustments made by palliative care.  He has had dental clearance for continued Zometa.  We can proceed with chemotherapy as planned today.  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 This will give [place] therapeutic options later in his treatment course. Previously I made it clear to Mr. Gombos that all treatments for administering are palliative in nature and designed to shrink the tumor and reduce symptoms.  The goal is to extend life and provide him some quality of life.  Once again we reiterated that there is no cure for his disease and that all steps moving forward should be made with this in mind.  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 Pathology could not definitively say, but he had a prior nodule on CXR and mass in lung appears to be the primary lesion -- Guardant 360 with actionable EGFR mutation noted.  --started Carbo/Pem/Pem (regimen detailed above) Cycle 1 Day 1 on 06/23/2019. Cycle 1 administered inpatient, pembrolizumab was not able to be added at that time. Cycle 2 (07/13/2019) included pembroliuzmab, which will be included in all subsequent cycles. He is now in maintenance phase with pemetrexed/pembrolizumab --repeat CT scan on 09/12/2019 showed interval response to therapy. His next scan is due in Nov 2021.  --pain control regimen as listed below   --RTC 12/14/2019 with next cycle of chemotherapy.   #Facial Swelling, stable #Cervical Fullness/Lymphadenopathy, stable --bilateral facial swelling, most pronounced in the neck bilaterally. Some lid  edema and facial fullness with no marked increase in arm swelling --no worsening abdominal or LE swelling. No difficultly breathing or swallowing.  --on exam today the cervical regions bilaterally felt stable.   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 and radiation oncology are following, Appreciate assistance in management of brain mets.  --pain control as noted below.  #Pain Control, stable --pain is predominately in the back and abdomen --appreciate the assistance of palliative care in this patient's pain management --patient is taking dexamethasone 1 mg PO daily  -- fentanyl patches to 50 mcg QOD. Refill today  --oxycodone 20-52m q6H PRN --additionally is taking cymablata 368mPO and celebrex 20015maily. Trazadone for sleep --senna docusate/PRN miralax for opioid induced constipation  #Osseus Lesions/Hip Pain #Loose Teeth --pain control as above --encourage dental evaluation (has yet to be done) --continue IV zometa therapy for bone protection. Started 07/13/2019, to continue q 3 months. Delayed for dental evaluation which has been completed. Restart with next cycle.   #Left Eye Vision Changes --[facility] globe vs occipital lobe lesion --currently using eye patch over left eye.  --continue to monitor   # Weight Loss, stable --weight today is stable from prior  --encourage high calorie diet --possible increase appetite and weight from steroid therapy --continue to monitor  #Pulmonary Embolism --continue Xarelto 79m80m daily   Orders Placed This Encounter  Procedures  . CT CHEST ABDOMEN PELVIS W CONTRAST    Standing Status:   Future  Standing Expiration Date:   12/03/2020    Order Specific Question:   If indicated for the ordered procedure, I authorize the  administration of contrast media per Radiology protocol    Answer:   Yes    Order Specific Question:   Preferred imaging location?    Answer:   [facility]    Order Specific Question:   Is Oral Contrast requested for this exam?    Answer:   Yes, Per Radiology protocol    Order Specific Question:   Reason for Exam (SYMPTOM  OR DIAGNOSIS REQUIRED)    Answer:   Lung cancer on chemotherapy, assess for response.   All questions were answered. The patient knows to call the clinic with any problems, questions or concerns.  A total of more than 30 minutes were spent on this encounter and over half of that time was spent on counseling and coordination of care as outlined above.   Ledell Peoples, MD Department of Hematology/Oncology [place] [facility] Phone: [phone] Pager: [phone] Email: [email or web address]  12/04/2019 7:18 AM

## 2019-12-05 ENCOUNTER — Other Ambulatory Visit: Payer: Self-pay | Admitting: *Deleted

## 2019-12-08 ENCOUNTER — Inpatient Hospital Stay: Admission: RE | Admit: 2019-12-08 | Payer: PRIVATE HEALTH INSURANCE | Source: Ambulatory Visit

## 2019-12-11 ENCOUNTER — Ambulatory Visit: Payer: PRIVATE HEALTH INSURANCE | Admitting: Hematology and Oncology

## 2019-12-11 ENCOUNTER — Ambulatory Visit: Payer: PRIVATE HEALTH INSURANCE

## 2019-12-11 ENCOUNTER — Other Ambulatory Visit: Payer: PRIVATE HEALTH INSURANCE

## 2019-12-11 ENCOUNTER — Telehealth: Payer: Self-pay | Admitting: Hematology and Oncology

## 2019-12-11 NOTE — Telephone Encounter (Signed)
Called pt per 11/15 sch msg  - no answer. Left message for patient that appt can not be rescheduled.

## 2019-12-13 ENCOUNTER — Other Ambulatory Visit: Payer: Self-pay | Admitting: Hematology and Oncology

## 2019-12-13 NOTE — Progress Notes (Signed)
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 Short term f/u of this lesion was recommended. CXR on 05/01/2018 performed with recommended f/u imaging. 2) 05/10/2019: presented to the ED with headache and left pelvis pain. Patient underwent CT C/A/P and MRI brain. Brain MRI showed numerous enhancing parenchymal intracranial lesions likely reflecting metastases. CT scan showed cavitary lesion in the right lung apex with spiculated margins. Additional sites of disease include lymph nodes of the chest and numerous liver mets. In the bones there were multiple lytic appearing lesions throughout the thoracic and lumbar spine as well as portions of the bony pelvis. Referred to Oncology. 3) 05/16/2019: US biopsy of the liver performed. Findings consistent with a poorly differentiated adenocarcinoma.  4) 05/19/2019: establish care with Dr. Lorenso Courier.  5) 05/30/2019: CT simulation. Received Radiation to brain/spine.  5) 06/23/2019: Received Cycle 1 Day 1 of Carbo/Pemetrexed while inpatient. Pembrolizumab not able to be administered in inpatient setting. 6) 07/13/2019:Cycle 2 Day 1 of Carboplatin/Pemetrexed/Pembrolizumab 7)  08/04/2019:Cycle 3 day 1 of Carboplatin/Pemetrexed/Pembrolizumab 8) 08/24/2019: Cycle 4 day 1 of Carboplatin/Pemetrexed/Pembrolizumab 9) 09/14/2019: Cycle 5 day 1 of Carboplatin/Pemetrexed/Pembrolizumab 10) 10/09/2019: Cycle 6 day 1 of Pemetrexed/Pembrolizumab (held Carboplatin due to cytopenias. Intended to begin holding on cycle 7) 11) Cycle 7 delayed due to the patient being admitted.  12) 11/23/2019: Cycle 7 day 1 of Pemetrexed/Pembrolizumab 13) 12/14/2019: Cycle 8 day 1 of  Pemetrexed/Pembrolizumab  Interval History:  William Ali 34 y.o. male with medical history significant for metastatic adenocarcinoma of the lung who presents for a follow up visit.  The patient was last seen while on 11/23/2019. In the interim he has had no hospitalizations or ED visits.  On exam today Mr. Karapetyan notes been doing well in the interval since our last visit until about 1 to 2 weeks ago.  He reports that he began developing worsening mouth pain and stomach discomfort.  He notes that he has been having worsening fatigue and reports that he sleeps an extra 67 hours a day on top of his usual 8 hours per night.  He typically takes one of the 10 mg ox is a day but occasionally his pain becomes severe that he takes 1-2 of these at night.  He feels that he is "collecting fluid" in and around his neck, in his abdomen.  He does not have as much swelling in his feet but he does have some in his hands.  On further discussion he notes that he is eating well and has not been having any issues with nausea, vomiting, or diarrhea.  He rates his pain is about a 7 out of 10 in severity today, but while at home and relaxing he is about a 4 or 5 out of 10.  He also recently developed a rash on his abdomen for which he has been trying an over-the-counter cream has had near resolution of this rash.  He did have a cough last week where he coughed up some blood clots, but is had none this week.  He denies having any other overt signs of bleeding.  He has had no issues with nausea, vomiting, or diarrhea.  He denies having any fevers, chills, sweats.  He does occasionally have symptoms of the shortness of breath  and rare nonbloody cough.  He has been tolerating his Xarelto therapy well without any bleeding, or bruising.  A full 10 point ROS is listed below.  MEDICAL HISTORY:  Past Medical History:  Diagnosis Date   Back pain    Hypertension    met lung ca to liver, spine, and brain dx'd 03/2019     SURGICAL HISTORY: Past Surgical History:  Procedure Laterality Date   IR IMAGING GUIDED PORT INSERTION  06/12/2019   KNEE SURGERY      SOCIAL HISTORY: Social History   Socioeconomic History   Marital status: Married    Spouse name: Not on file   Number of children: Not on file   Years of education: Not on file   Highest education level: Not on file  Occupational History   Not on file  Tobacco Use   Smoking status: Never Smoker   Smokeless tobacco: Never Used  Vaping Use   Vaping Use: Never used  Substance and Sexual Activity   Alcohol use: No   Drug use: No   Sexual activity: Yes  Other Topics Concern   Not on file  Social History Narrative   Not on file   Social Determinants of Health   Financial Resource Strain:    Difficulty of Paying Living Expenses: Not on file  Food Insecurity:    Worried About [place] in the Last Year: Not on file   Ran Out of Food in the Last Year: Not on file  Transportation Needs:    Lack of Transportation (Medical): Not on file   Lack of Transportation (Non-Medical): Not on file  Physical Activity:    Days of Exercise per Week: Not on file   Minutes of Exercise per Session: Not on file  Stress:    Feeling of Stress : Not on file  Social Connections:    Frequency of Communication with Friends and Family: Not on file   Frequency of Social Gatherings with Friends and Family: Not on file   Attends Religious Services: Not on file   Active Member of Clubs or Organizations: Not on file   Attends Archivist Meetings: Not on file   Marital Status: Not on file  Intimate Partner Violence:    Fear of Current or Ex-Partner: Not on file   Emotionally Abused: Not on file   Physically Abused: Not on file   Sexually Abused: Not on file    FAMILY HISTORY: Family History  Problem Relation Age of Onset   Hypertension Mother    Diabetes Father     ALLERGIES:  is allergic to onion,  other, peanut oil, and peanut-containing drug products.  MEDICATIONS:  Current Outpatient Medications  Medication Sig Dispense Refill   albuterol (VENTOLIN HFA) 108 (90 Base) MCG/ACT inhaler TAKE 2 PUFFS BY MOUTH EVERY 6 HOURS AS NEEDED FOR WHEEZE OR SHORTNESS OF BREATH (Patient not taking: Reported on 11/14/2019) 6.7 g 1   ALPRAZolam (XANAX) 1 MG tablet Take 1 tablet (1 mg total) by mouth every 4 (four) hours as needed for anxiety. 30 tablet 0   celecoxib (CELEBREX) 100 MG capsule Take 1 capsule (100 mg total) by mouth 2 (two) times daily. 60 capsule 1   dexamethasone (DECADRON) 2 MG tablet Take 1 tablet (2 mg total) by mouth daily. 30 tablet 3   DULoxetine (CYMBALTA) 30 MG capsule TAKE 1 CAPSULE BY MOUTH EVERY DAY 30 capsule 0   feeding supplement, ENSURE ENLIVE, (ENSURE ENLIVE) LIQD Take 237 mLs by  mouth 2 (two) times daily between meals. (Patient not taking: Reported on 11/14/2019) 237 mL 12   fentaNYL (DURAGESIC) 50 MCG/HR Place 1 patch onto the skin every other day. 15 patch 0   folic acid (FOLVITE) 1 MG tablet Take 1 tablet (1 mg total) by mouth daily. 60 tablet 3   ibuprofen (IBU) 800 MG tablet Take 1 tablet (800 mg total) by mouth in the morning and at bedtime. (Patient not taking: Reported on 11/02/2019) 30 tablet 0   lidocaine-prilocaine (EMLA) cream Apply 1 application topically as needed. (Patient taking differently: Apply 1 application topically as needed (access port). ) 30 g 2   magnesium oxide (MAG-OX) 400 (241.3 Mg) MG tablet Take 2 tablets (800 mg total) by mouth daily. 30 tablet 3   metoprolol succinate (TOPROL XL) 50 MG 24 hr tablet Take 1 tablet (50 mg total) by mouth daily. Take with or immediately following a meal. 30 tablet 11   ondansetron (ZOFRAN) 4 MG tablet Take 1 tablet (4 mg total) by mouth every 8 (eight) hours as needed for nausea or vomiting. 60 tablet 0   Oxycodone HCl 20 MG TABS Take 1-2 tablets (20-40 mg total) by mouth every 6 (six) hours as needed  (Breakthrough pain). 60 tablet 0   pantoprazole (PROTONIX) 40 MG tablet Take 1 tablet (40 mg total) by mouth 2 (two) times daily. 60 tablet 3   polyethylene glycol (MIRALAX / GLYCOLAX) 17 g packet Take 17 g by mouth daily as needed for moderate constipation. (Patient not taking: Reported on 11/14/2019) 14 each 0   polyvinyl alcohol (LIQUIFILM TEARS) 1.4 % ophthalmic solution Place 1 drop into both eyes as needed for dry eyes. 15 mL 0   rivaroxaban (XARELTO) 20 MG TABS tablet Take 1 tablet (20 mg total) by mouth daily with supper. 90 tablet 1   senna-docusate (SENOKOT-S) 8.6-50 MG tablet Take 2 tablets by mouth at bedtime. (Patient not taking: Reported on 11/14/2019)     traZODone (DESYREL) 100 MG tablet Take 1 tablet (100 mg total) by mouth at bedtime. 30 tablet 0   No current facility-administered medications for this visit.    REVIEW OF SYSTEMS:   Constitutional: ( - ) fevers, ( - )  chills , ( - ) night sweats Eyes: ( - ) blurriness of vision, ( - ) double vision, ( - ) watery eyes Ears, nose, mouth, throat, and face: ( - ) mucositis, ( - ) sore throat Respiratory: ( - ) cough, ( - ) dyspnea, ( - ) wheezes Cardiovascular: ( - ) palpitation, ( - ) chest discomfort, ( - ) lower extremity swelling Gastrointestinal:  ( - ) nausea, ( - ) heartburn, ( - ) change in bowel habits Skin: ( - ) abnormal skin rashes Lymphatics: ( - ) new lymphadenopathy, ( - ) easy bruising Neurological: ( - ) numbness, ( - ) tingling, ( - ) new weaknesses Behavioral/Psych: ( - ) mood change, ( - ) new changes  All other systems were reviewed with the patient and are negative.  PHYSICAL EXAMINATION: ECOG PERFORMANCE STATUS: 2 - Symptomatic, <50% confined to bed  Vitals:   12/14/19 0832  BP: 126/61  Pulse: (!) 117  Resp: 17  Temp: (!) 97.4 F (36.3 C)  SpO2: 99%   Filed Weights   12/14/19 0832  Weight: 214 lb (97.1 kg)    GENERAL: well appearing young African American male in NAD  SKIN: skin  color, texture, turgor are normal, no rashes or  significant lesions NECK: supple, nontender LYMPH: fullness of the cervical lymph nodes bilaterally. No clear axillary lymph nodes. Stable from prior EYES: conjunctiva are pink and non-injected, sclera clear LUNGS: clear to auscultation and percussion with normal breathing effort HEART: regular rate & rhythm and no murmurs and no lower extremity edema Musculoskeletal: no cyanosis of digits and no clubbing. Increased swelling inferior to axilla bilaterally. Also noted to have soft tissue swelling in left leg  PSYCH: alert & oriented x 3, fluent speech NEURO: no focal motor/sensory deficits  LABORATORY DATA:  I have reviewed the data as listed CBC Latest Ref Rng & Units 12/14/2019 11/23/2019 11/02/2019  WBC 4.0 - 10.5 K/uL 6.9 5.6 7.1  Hemoglobin 13.0 - 17.0 g/dL 9.7(L) 10.3(L) 10.2(L)  Hematocrit 39 - 52 % 32.2(L) 33.6(L) 34.7(L)  Platelets 150 - 400 K/uL 85(L) 72(L) 147(L)    CMP Latest Ref Rng & Units 12/14/2019 11/23/2019 11/02/2019  Glucose 70 - 99 mg/dL 139(H) 94 154(H)  BUN 6 - 20 mg/dL 18 21(H) 17  Creatinine 0.61 - 1.24 mg/dL 1.12 0.98 0.91  Sodium 135 - 145 mmol/L 142 144 142  Potassium 3.5 - 5.1 mmol/L 3.7 3.8 3.9  Chloride 98 - 111 mmol/L 106 109 106  CO2 22 - 32 mmol/L 23 26 29   Calcium 8.9 - 10.3 mg/dL 9.0 9.1 9.3  Total Protein 6.5 - 8.1 g/dL 6.9 6.8 7.1  Total Bilirubin 0.3 - 1.2 mg/dL 1.4(H) 0.7 0.6  Alkaline Phos 38 - 126 U/L 788(H) 493(H) 467(H)  AST 15 - 41 U/L 99(H) 98(H) 87(H)  ALT 0 - 44 U/L 142(H) 234(H) 144(H)    RADIOGRAPHIC STUDIES: No results found.  ASSESSMENT & PLAN Isa Isidoro Ali 34 y.o. male with medical history significant for metastatic adenocarcinoma of the lung who presents for a follow up visit prior to Cycle 8 of chemotherapy.   The patient successfully completed Cycle 1 of carboplatin and pemetrexed, started on 06/23/2019.  Unfortunately this had to be administered in the inpatient setting  because the patient was admitted for pain control.  As we were unsure when he would be discharged we decided to move forward with the treatment at that time.  Pembrolizumab was not able to be administered in house due to logistical and insurance issues.  He received pembrolizumab during Cycle 2 which started on 07/13/2019.  On exam today Mr. Nuno notes that he has had worsening of his fatigue, but his pain is under control.  His weight has been increasing this may represent fluid weight that is accumulating around his neck and in his abdomen.  He has little collection in his feet.  He is tolerating his relative therapy well and otherwise is not having any other concerning signs or symptoms.  He does have an elevation in alk phos today but his other labs are relatively stable.  Okay for proceeding with treatment.  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 This will give [place] therapeutic options later in his treatment course. Previously I made it clear to Mr. Maslowski that all treatments for administering are palliative in nature and designed to shrink the tumor and reduce symptoms.  The goal is to extend life and provide him some quality of life.  Once again we reiterated that there is no cure for his disease and that all steps moving forward should be made with this in mind.  # Metastatic Adenocarcinoma of the Lung with Brain, Osseus, and  Liver Metastasis --patient has extensive metastatic spread of poorly differentiated adenocarcinoma, most likely lung in origin. Pathology could not definitively say, but he had a prior nodule on CXR and mass in lung appears to be the primary lesion -- Guardant 360 with actionable EGFR mutation noted.  --started Carbo/Pem/Pem (regimen detailed above) Cycle 1 Day 1 on 06/23/2019. Cycle 1 administered inpatient, pembrolizumab was not able to be added at that time. Cycle 2 (07/13/2019) included pembroliuzmab, which will be  included in all subsequent cycles. He is now in maintenance phase with pemetrexed/pembrolizumab --repeat CT scan on 09/12/2019 showed interval response to therapy. His next scan is due now ( in Nov 2021).  --LFTs at baseline with increase in the ALK phos. Continue to monitor  --pain control regimen as listed below   --RTC 01/04/2020 with next cycle of chemotherapy.   #Facial Swelling, stable #Cervical Fullness/Lymphadenopathy, stable --bilateral facial swelling, most pronounced in the neck bilaterally. Some lid edema and facial fullness with no marked increase in arm swelling --no worsening abdominal or LE swelling. No difficultly breathing or swallowing.  --on exam today the cervical regions bilaterally felt stable.   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 and radiation oncology are following, Appreciate assistance in management of brain mets.  --pain control as noted below.  #Pain Control, stable --pain is predominately in the back and abdomen --appreciate the assistance of palliative care in this patient's pain management --patient is taking dexamethasone 1 mg PO daily  -- fentanyl patches to 50 mcg QOD. Refill today  --oxycodone 20-28m q6H PRN --additionally is taking cymablata 365mPO and celebrex 20067maily. Trazadone for sleep --senna docusate/PRN miralax for opioid induced constipation  #Osseus Lesions/Hip Pain #Loose Teeth --pain control as above --encourage dental evaluation (has yet to be done) --continue IV zometa therapy for bone protection. Started 07/13/2019, to continue q 3 months. Delayed for dental evaluation which has been completed. Restart with next cycle.   #Left Eye Vision Changes --[facility] vs [facility]ipital [facility] --currently using eye patch over left  eye.  --continue to monitor   # Weight Loss, stable --weight today is increased from prior, may represent fluid accumulation with steroids.  --encourage high calorie diet --possible increase appetite and weight from steroid therapy --continue to monitor  #Pulmonary Embolism --continue Xarelto 67m58m daily   No orders of the defined types were placed in this encounter.  All questions were answered. The patient knows to call the clinic with any problems, questions or concerns.  A total of more than 30 minutes were spent on this encounter and over half of that time was spent on counseling and coordination of care as outlined above.   JohnLedell Peoples Department of Hematology/Oncology [facility]: [phone]: [phone]: [email or web address]  12/14/2019 9:33 AM

## 2019-12-14 ENCOUNTER — Inpatient Hospital Stay: Payer: Medicaid Other | Attending: Hematology and Oncology

## 2019-12-14 ENCOUNTER — Other Ambulatory Visit: Payer: Self-pay | Admitting: Hematology and Oncology

## 2019-12-14 ENCOUNTER — Inpatient Hospital Stay: Payer: Medicaid Other

## 2019-12-14 ENCOUNTER — Other Ambulatory Visit: Payer: Self-pay

## 2019-12-14 ENCOUNTER — Inpatient Hospital Stay (HOSPITAL_BASED_OUTPATIENT_CLINIC_OR_DEPARTMENT_OTHER): Payer: Medicaid Other | Admitting: Hematology and Oncology

## 2019-12-14 VITALS — HR 107

## 2019-12-14 VITALS — BP 126/61 | HR 117 | Temp 97.4°F | Resp 17 | Ht 67.0 in | Wt 214.0 lb

## 2019-12-14 DIAGNOSIS — C787 Secondary malignant neoplasm of liver and intrahepatic bile duct: Secondary | ICD-10-CM | POA: Diagnosis not present

## 2019-12-14 DIAGNOSIS — C7951 Secondary malignant neoplasm of bone: Secondary | ICD-10-CM | POA: Diagnosis not present

## 2019-12-14 DIAGNOSIS — I2694 Multiple subsegmental pulmonary emboli without acute cor pulmonale: Secondary | ICD-10-CM | POA: Diagnosis not present

## 2019-12-14 DIAGNOSIS — C7931 Secondary malignant neoplasm of brain: Secondary | ICD-10-CM

## 2019-12-14 DIAGNOSIS — C3491 Malignant neoplasm of unspecified part of right bronchus or lung: Secondary | ICD-10-CM | POA: Diagnosis not present

## 2019-12-14 DIAGNOSIS — Z86711 Personal history of pulmonary embolism: Secondary | ICD-10-CM | POA: Diagnosis not present

## 2019-12-14 DIAGNOSIS — Z5111 Encounter for antineoplastic chemotherapy: Secondary | ICD-10-CM | POA: Diagnosis not present

## 2019-12-14 DIAGNOSIS — Z7901 Long term (current) use of anticoagulants: Secondary | ICD-10-CM | POA: Insufficient documentation

## 2019-12-14 DIAGNOSIS — Z79899 Other long term (current) drug therapy: Secondary | ICD-10-CM | POA: Diagnosis not present

## 2019-12-14 DIAGNOSIS — C7949 Secondary malignant neoplasm of other parts of nervous system: Secondary | ICD-10-CM

## 2019-12-14 LAB — CBC WITH DIFFERENTIAL (CANCER CENTER ONLY)
Abs Immature Granulocytes: 0.12 10*3/uL — ABNORMAL HIGH (ref 0.00–0.07)
Basophils Absolute: 0 10*3/uL (ref 0.0–0.1)
Basophils Relative: 0 %
Eosinophils Absolute: 0 10*3/uL (ref 0.0–0.5)
Eosinophils Relative: 0 %
HCT: 32.2 % — ABNORMAL LOW (ref 39.0–52.0)
Hemoglobin: 9.7 g/dL — ABNORMAL LOW (ref 13.0–17.0)
Immature Granulocytes: 2 %
Lymphocytes Relative: 3 %
Lymphs Abs: 0.2 10*3/uL — ABNORMAL LOW (ref 0.7–4.0)
MCH: 28.9 pg (ref 26.0–34.0)
MCHC: 30.1 g/dL (ref 30.0–36.0)
MCV: 95.8 fL (ref 80.0–100.0)
Monocytes Absolute: 1.2 10*3/uL — ABNORMAL HIGH (ref 0.1–1.0)
Monocytes Relative: 17 %
Neutro Abs: 5.3 10*3/uL (ref 1.7–7.7)
Neutrophils Relative %: 78 %
Platelet Count: 85 10*3/uL — ABNORMAL LOW (ref 150–400)
RBC: 3.36 MIL/uL — ABNORMAL LOW (ref 4.22–5.81)
RDW: 20.3 % — ABNORMAL HIGH (ref 11.5–15.5)
WBC Count: 6.9 10*3/uL (ref 4.0–10.5)
nRBC: 0.9 % — ABNORMAL HIGH (ref 0.0–0.2)

## 2019-12-14 LAB — CMP (CANCER CENTER ONLY)
ALT: 142 U/L — ABNORMAL HIGH (ref 0–44)
AST: 99 U/L — ABNORMAL HIGH (ref 15–41)
Albumin: 3 g/dL — ABNORMAL LOW (ref 3.5–5.0)
Alkaline Phosphatase: 788 U/L — ABNORMAL HIGH (ref 38–126)
Anion gap: 13 (ref 5–15)
BUN: 18 mg/dL (ref 6–20)
CO2: 23 mmol/L (ref 22–32)
Calcium: 9 mg/dL (ref 8.9–10.3)
Chloride: 106 mmol/L (ref 98–111)
Creatinine: 1.12 mg/dL (ref 0.61–1.24)
GFR, Estimated: >60 mL/min (ref >60)
Glucose, Bld: 139 mg/dL — ABNORMAL HIGH (ref 70–99)
Potassium: 3.7 mmol/L (ref 3.5–5.1)
Sodium: 142 mmol/L (ref 135–145)
Total Bilirubin: 1.4 mg/dL — ABNORMAL HIGH (ref 0.3–1.2)
Total Protein: 6.9 g/dL (ref 6.5–8.1)

## 2019-12-14 LAB — TSH: TSH: 2.799 u[IU]/mL (ref 0.320–4.118)

## 2019-12-14 MED ORDER — SODIUM CHLORIDE 0.9 % IV SOLN
200.0000 mg | Freq: Once | INTRAVENOUS | Status: AC
  Administered 2019-12-14: 200 mg via INTRAVENOUS
  Filled 2019-12-14: qty 8

## 2019-12-14 MED ORDER — HYDROMORPHONE HCL 1 MG/ML IJ SOLN: 1.0000 mg | Freq: Once | INTRAMUSCULAR | Status: DC

## 2019-12-14 MED ORDER — ZOLEDRONIC ACID 4 MG/100ML IV SOLN
INTRAVENOUS | Status: AC
  Filled 2019-12-14: qty 100

## 2019-12-14 MED ORDER — SODIUM CHLORIDE 0.9 % IV SOLN
500.0000 mg/m2 | Freq: Once | INTRAVENOUS | Status: AC
  Administered 2019-12-14: 1100 mg via INTRAVENOUS
  Filled 2019-12-14: qty 40

## 2019-12-14 MED ORDER — HYDROMORPHONE HCL 1 MG/ML IJ SOLN
INTRAMUSCULAR | Status: AC
  Filled 2019-12-14: qty 1

## 2019-12-14 MED ORDER — PROCHLORPERAZINE MALEATE 10 MG PO TABS
10.0000 mg | ORAL_TABLET | Freq: Once | ORAL | Status: AC
  Administered 2019-12-14: 10 mg via ORAL

## 2019-12-14 MED ORDER — SODIUM CHLORIDE 0.9% FLUSH
3.0000 mL | Freq: Once | INTRAVENOUS | Status: DC | PRN
  Filled 2019-12-14: qty 10

## 2019-12-14 MED ORDER — SODIUM CHLORIDE 0.9% FLUSH
10.0000 mL | INTRAVENOUS | Status: DC | PRN
  Administered 2019-12-14: 10 mL
  Filled 2019-12-14: qty 10

## 2019-12-14 MED ORDER — HEPARIN SOD (PORK) LOCK FLUSH 100 UNIT/ML IV SOLN
500.0000 [IU] | Freq: Once | INTRAVENOUS | Status: AC | PRN
  Administered 2019-12-14: 500 [IU]
  Filled 2019-12-14: qty 5

## 2019-12-14 MED ORDER — ZOLEDRONIC ACID 4 MG/100ML IV SOLN
4.0000 mg | Freq: Once | INTRAVENOUS | Status: AC
  Administered 2019-12-14: 4 mg via INTRAVENOUS

## 2019-12-14 MED ORDER — HYDROMORPHONE HCL 1 MG/ML IJ SOLN
1.0000 mg | Freq: Once | INTRAMUSCULAR | Status: AC
  Administered 2019-12-14: 1 mg via INTRAVENOUS

## 2019-12-14 MED ORDER — SODIUM CHLORIDE 0.9 % IV SOLN
Freq: Once | INTRAVENOUS | Status: AC
  Filled 2019-12-14: qty 250

## 2019-12-14 NOTE — Progress Notes (Signed)
OK to treat with current platelet count and CMP results. Per Dr. Lorenso Courier.

## 2019-12-14 NOTE — Progress Notes (Signed)
di

## 2019-12-14 NOTE — Progress Notes (Signed)
Per Dr. Lorenso Courier, okay to treat with elevated heart rate.

## 2019-12-14 NOTE — Progress Notes (Signed)
Confirmed with Dr. Lorenso Courier, pt is to receive Zometa today.  Hardie Pulley, PharmD, BCPS, BCOP

## 2019-12-14 NOTE — Patient Instructions (Signed)
[facility] Discharge Instructions for Patients Receiving Chemotherapy  Today you received the following chemotherapy agents: Keytruda and Alimta  To help prevent nausea and vomiting after your treatment, we encourage you to take your nausea medication  as prescribed.    If you develop nausea and vomiting that is not controlled by your nausea medication, call the clinic.   BELOW ARE SYMPTOMS THAT SHOULD BE REPORTED IMMEDIATELY:  *FEVER GREATER THAN 100.5 F  *CHILLS WITH OR WITHOUT FEVER  NAUSEA AND VOMITING THAT IS NOT CONTROLLED WITH YOUR NAUSEA MEDICATION  *UNUSUAL SHORTNESS OF BREATH  *UNUSUAL BRUISING OR BLEEDING  TENDERNESS IN MOUTH AND THROAT WITH OR WITHOUT PRESENCE OF ULCERS  *URINARY PROBLEMS  *BOWEL PROBLEMS  UNUSUAL RASH Items with * indicate a potential emergency and should be followed up as soon as possible.  Feel free to call the clinic should you have any questions or concerns. The clinic phone number is [phone].  Please show the [facility] [facility] at check-in to the Emergency Department and triage nurse.

## 2019-12-15 ENCOUNTER — Ambulatory Visit: Payer: PRIVATE HEALTH INSURANCE | Admitting: Internal Medicine

## 2019-12-15 LAB — T4: T4, Total: 13.9 ug/dL — ABNORMAL HIGH (ref 4.5–12.0)

## 2019-12-19 MED FILL — METOPROLOL SUCCINATE ER 50: 50 | 30 days supply | Qty: 30 | Fill #5

## 2019-12-21 ENCOUNTER — Encounter: Payer: Self-pay | Admitting: Hematology and Oncology

## 2019-12-23 ENCOUNTER — Telehealth: Payer: Self-pay | Admitting: Radiation Oncology

## 2019-12-23 NOTE — Telephone Encounter (Signed)
  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 The L/S spine was treated to a dose of 30 Gy in 10 fractions using an 3Dplan. This consisted of 4 fields. 2. The upper T-spine was treated to a dose of 30 Gy in 10 fractions using an isodose plan. This consisted of 2 fields. 3. The lower T-spine was treated to a dose of 30 Gy in 10 fractions using an isodose plan. This consisted of 2 fields. 4. The whole brain was treated to a dose of 30 Gy in 10 fractions using an isodose plan. This consisted of 4 fields.  Narrative:  The patient was contacted today for routine follow-up. During treatment he did very well with radiotherapy and did not have significant desquamation.   Impression/Plan: 1. Stage IV poorly differentiated adenocarcinoma of the lung. I was unable to reach the patient but left a voicemail and on the message  I  discussed that we would be happy to continue to follow him as needed, but he will also continue to follow up with Dr. Lorenso Courier in medical oncology and with MRI of the brain and follow up with [name] in January 2022.        Carola Rhine, PAC

## 2019-12-25 ENCOUNTER — Other Ambulatory Visit: Payer: PRIVATE HEALTH INSURANCE

## 2019-12-25 ENCOUNTER — Ambulatory Visit: Payer: PRIVATE HEALTH INSURANCE | Admitting: Hematology and Oncology

## 2019-12-25 ENCOUNTER — Encounter: Payer: Self-pay | Admitting: Internal Medicine

## 2019-12-25 ENCOUNTER — Ambulatory Visit: Payer: PRIVATE HEALTH INSURANCE

## 2019-12-26 ENCOUNTER — Other Ambulatory Visit: Payer: Self-pay | Admitting: Internal Medicine

## 2019-12-26 MED ORDER — DEXAMETHASONE 1 MG PO TABS: 1.0000 mg | ORAL_TABLET | Freq: Every day | ORAL | 1 refills | Status: AC

## 2019-12-28 ENCOUNTER — Ambulatory Visit (HOSPITAL_COMMUNITY): Payer: Medicaid Other

## 2020-01-01 ENCOUNTER — Other Ambulatory Visit: Payer: Self-pay

## 2020-01-01 ENCOUNTER — Other Ambulatory Visit: Payer: Self-pay | Admitting: Critical Care Medicine

## 2020-01-01 ENCOUNTER — Other Ambulatory Visit: Payer: Self-pay | Admitting: Hematology and Oncology

## 2020-01-01 NOTE — Telephone Encounter (Signed)
Requested medication (s) are due for refill today:  Yes  Requested medication (s) are on the active medication list:   Yes  Future visit scheduled:   No   Last ordered: 11/30/2019 #30, 0 refills  Returned because no protocol was assigned to this medication.     Requested Prescriptions  Pending Prescriptions Disp Refills   traZODone (DESYREL) 100 MG tablet [Pharmacy Med Name: TRAZODONE 100 MG TABLET] 30 tablet 0    Sig: TAKE 1 TABLET BY MOUTH EVERYDAY AT BEDTIME      There is no refill protocol information for this order

## 2020-01-01 NOTE — Telephone Encounter (Signed)
For approval or refusal. Japheth Diekman M Vania Rosero, RN  

## 2020-01-02 MED ORDER — DULOXETINE HCL 30 MG PO CPEP
30.0000 mg | ORAL_CAPSULE | Freq: Every day | ORAL | 3 refills | Status: DC
Start: 2020-01-02 — End: 2020-01-04

## 2020-01-03 ENCOUNTER — Other Ambulatory Visit: Payer: Self-pay | Admitting: Hematology and Oncology

## 2020-01-03 MED ORDER — FENTANYL 50 MCG/HR TD PT72: 1.0000 | MEDICATED_PATCH | TRANSDERMAL | 0 refills | Status: AC

## 2020-01-04 ENCOUNTER — Inpatient Hospital Stay: Payer: Medicaid Other

## 2020-01-04 ENCOUNTER — Other Ambulatory Visit: Payer: Self-pay

## 2020-01-04 ENCOUNTER — Inpatient Hospital Stay: Payer: Medicaid Other | Attending: Hematology and Oncology | Admitting: Hematology and Oncology

## 2020-01-04 ENCOUNTER — Other Ambulatory Visit: Payer: Self-pay | Admitting: Hematology and Oncology

## 2020-01-04 ENCOUNTER — Other Ambulatory Visit: Payer: Self-pay | Admitting: Critical Care Medicine

## 2020-01-04 ENCOUNTER — Encounter: Payer: Self-pay | Admitting: Hematology and Oncology

## 2020-01-04 VITALS — BP 135/53 | HR 107 | Temp 97.9°F | Ht 67.0 in

## 2020-01-04 DIAGNOSIS — C787 Secondary malignant neoplasm of liver and intrahepatic bile duct: Secondary | ICD-10-CM | POA: Insufficient documentation

## 2020-01-04 DIAGNOSIS — C7951 Secondary malignant neoplasm of bone: Secondary | ICD-10-CM | POA: Insufficient documentation

## 2020-01-04 DIAGNOSIS — C3490 Malignant neoplasm of unspecified part of unspecified bronchus or lung: Secondary | ICD-10-CM

## 2020-01-04 DIAGNOSIS — Z5111 Encounter for antineoplastic chemotherapy: Secondary | ICD-10-CM | POA: Insufficient documentation

## 2020-01-04 DIAGNOSIS — C7931 Secondary malignant neoplasm of brain: Secondary | ICD-10-CM

## 2020-01-04 DIAGNOSIS — C7949 Secondary malignant neoplasm of other parts of nervous system: Secondary | ICD-10-CM

## 2020-01-04 DIAGNOSIS — C3491 Malignant neoplasm of unspecified part of right bronchus or lung: Secondary | ICD-10-CM | POA: Diagnosis present

## 2020-01-04 DIAGNOSIS — Z79899 Other long term (current) drug therapy: Secondary | ICD-10-CM | POA: Insufficient documentation

## 2020-01-04 DIAGNOSIS — Z95828 Presence of other vascular implants and grafts: Secondary | ICD-10-CM

## 2020-01-04 DIAGNOSIS — G893 Neoplasm related pain (acute) (chronic): Secondary | ICD-10-CM | POA: Diagnosis not present

## 2020-01-04 LAB — CBC WITH DIFFERENTIAL (CANCER CENTER ONLY)
Abs Immature Granulocytes: 0.11 10*3/uL — ABNORMAL HIGH (ref 0.00–0.07)
Basophils Absolute: 0 10*3/uL (ref 0.0–0.1)
Basophils Relative: 0 %
Eosinophils Absolute: 0 10*3/uL (ref 0.0–0.5)
Eosinophils Relative: 0 %
HCT: 31.8 % — ABNORMAL LOW (ref 39.0–52.0)
Hemoglobin: 9.5 g/dL — ABNORMAL LOW (ref 13.0–17.0)
Immature Granulocytes: 1 %
Lymphocytes Relative: 4 %
Lymphs Abs: 0.3 10*3/uL — ABNORMAL LOW (ref 0.7–4.0)
MCH: 29.5 pg (ref 26.0–34.0)
MCHC: 29.9 g/dL — ABNORMAL LOW (ref 30.0–36.0)
MCV: 98.8 fL (ref 80.0–100.0)
Monocytes Absolute: 1.1 10*3/uL — ABNORMAL HIGH (ref 0.1–1.0)
Monocytes Relative: 14 %
Neutro Abs: 6.3 10*3/uL (ref 1.7–7.7)
Neutrophils Relative %: 81 %
Platelet Count: 81 10*3/uL — ABNORMAL LOW (ref 150–400)
RBC: 3.22 MIL/uL — ABNORMAL LOW (ref 4.22–5.81)
RDW: 20.7 % — ABNORMAL HIGH (ref 11.5–15.5)
WBC Count: 7.8 10*3/uL (ref 4.0–10.5)
nRBC: 1.8 % — ABNORMAL HIGH (ref 0.0–0.2)

## 2020-01-04 LAB — CMP (CANCER CENTER ONLY)
ALT: 136 U/L — ABNORMAL HIGH (ref 0–44)
AST: 114 U/L — ABNORMAL HIGH (ref 15–41)
Albumin: 2.8 g/dL — ABNORMAL LOW (ref 3.5–5.0)
Alkaline Phosphatase: 826 U/L — ABNORMAL HIGH (ref 38–126)
Anion gap: 12 (ref 5–15)
BUN: 23 mg/dL — ABNORMAL HIGH (ref 6–20)
CO2: 22 mmol/L (ref 22–32)
Calcium: 9.3 mg/dL (ref 8.9–10.3)
Chloride: 109 mmol/L (ref 98–111)
Creatinine: 1.18 mg/dL (ref 0.61–1.24)
GFR, Estimated: >60 mL/min (ref >60)
Glucose, Bld: 122 mg/dL — ABNORMAL HIGH (ref 70–99)
Potassium: 4.1 mmol/L (ref 3.5–5.1)
Sodium: 143 mmol/L (ref 135–145)
Total Bilirubin: 1.7 mg/dL — ABNORMAL HIGH (ref 0.3–1.2)
Total Protein: 6.7 g/dL (ref 6.5–8.1)

## 2020-01-04 LAB — MAGNESIUM: Magnesium: 1.9 mg/dL (ref 1.7–2.4)

## 2020-01-04 LAB — TSH: TSH: 0.482 u[IU]/mL (ref 0.320–4.118)

## 2020-01-04 MED ORDER — SODIUM CHLORIDE 0.9 % IV SOLN
500.0000 mg/m2 | Freq: Once | INTRAVENOUS | Status: AC
  Administered 2020-01-04: 1100 mg via INTRAVENOUS
  Filled 2020-01-04: qty 4

## 2020-01-04 MED ORDER — HYDROMORPHONE HCL 1 MG/ML IJ SOLN
2.0000 mg | Freq: Once | INTRAMUSCULAR | Status: AC
  Administered 2020-01-04: 2 mg via INTRAVENOUS

## 2020-01-04 MED ORDER — SODIUM CHLORIDE 0.9% FLUSH
10.0000 mL | INTRAVENOUS | Status: DC | PRN
  Administered 2020-01-04: 10 mL
  Filled 2020-01-04: qty 10

## 2020-01-04 MED ORDER — CYANOCOBALAMIN 1000 MCG/ML IJ SOLN
1000.0000 ug | Freq: Once | INTRAMUSCULAR | Status: AC
  Administered 2020-01-04: 1000 ug via INTRAMUSCULAR

## 2020-01-04 MED ORDER — HYDROMORPHONE HCL 1 MG/ML IJ SOLN
INTRAMUSCULAR | Status: AC
  Filled 2020-01-04: qty 2

## 2020-01-04 MED ORDER — CYANOCOBALAMIN 1000 MCG/ML IJ SOLN
INTRAMUSCULAR | Status: AC
  Filled 2020-01-04: qty 1

## 2020-01-04 MED ORDER — SODIUM CHLORIDE 0.9 % IV SOLN
200.0000 mg | Freq: Once | INTRAVENOUS | Status: AC
  Administered 2020-01-04: 200 mg via INTRAVENOUS
  Filled 2020-01-04: qty 8

## 2020-01-04 MED ORDER — DULOXETINE HCL 30 MG PO CPEP: 30.0000 mg | ORAL_CAPSULE | Freq: Every day | ORAL | 3 refills | Status: AC

## 2020-01-04 MED ORDER — PROCHLORPERAZINE MALEATE 10 MG PO TABS
10.0000 mg | ORAL_TABLET | Freq: Once | ORAL | Status: AC
  Administered 2020-01-04: 10 mg via ORAL

## 2020-01-04 MED ORDER — PROCHLORPERAZINE MALEATE 10 MG PO TABS
ORAL_TABLET | ORAL | Status: AC
  Filled 2020-01-04: qty 1

## 2020-01-04 MED ORDER — HEPARIN SOD (PORK) LOCK FLUSH 100 UNIT/ML IV SOLN
500.0000 [IU] | Freq: Once | INTRAVENOUS | Status: AC | PRN
  Administered 2020-01-04: 500 [IU]
  Filled 2020-01-04: qty 5

## 2020-01-04 MED ORDER — SODIUM CHLORIDE 0.9 % IV SOLN
Freq: Once | INTRAVENOUS | Status: AC
  Filled 2020-01-04: qty 250

## 2020-01-04 NOTE — Progress Notes (Signed)
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 Short term f/u of this lesion was recommended. CXR on 05/01/2018 performed with recommended f/u imaging. 2) 05/10/2019: presented to the ED with headache and left pelvis pain. Patient underwent CT C/A/P and MRI brain. Brain MRI showed numerous enhancing parenchymal intracranial lesions likely reflecting metastases. CT scan showed cavitary lesion in the right lung apex with spiculated margins. Additional sites of disease include lymph nodes of the chest and numerous liver mets. In the bones there were multiple lytic appearing lesions throughout the thoracic and lumbar spine as well as portions of the bony pelvis. Referred to Oncology. 3) 05/16/2019: US biopsy of the liver performed. Findings consistent with a poorly differentiated adenocarcinoma.  4) 05/19/2019: establish care with Dr. Lorenso Courier.  5) 05/30/2019: CT simulation. Received Radiation to brain/spine.  5) 06/23/2019: Received Cycle 1 Day 1 of Carbo/Pemetrexed while inpatient. Pembrolizumab not able to be administered in inpatient setting. 6) 07/13/2019:Cycle 2 Day 1 of Carboplatin/Pemetrexed/Pembrolizumab 7)  08/04/2019:Cycle 3 day 1 of Carboplatin/Pemetrexed/Pembrolizumab 8) 08/24/2019: Cycle 4 day 1 of Carboplatin/Pemetrexed/Pembrolizumab 9) 09/14/2019: Cycle 5 day 1 of Carboplatin/Pemetrexed/Pembrolizumab 10) 10/09/2019: Cycle 6 day 1 of Pemetrexed/Pembrolizumab (held Carboplatin due to cytopenias. Intended to begin holding on cycle 7) 11) Cycle 7 delayed due to the patient being admitted.  12) 11/23/2019: Cycle 7 day 1 of Pemetrexed/Pembrolizumab 13) 12/14/2019: Cycle 8 day 1 of  Pemetrexed/Pembrolizumab 14) 01/04/2020: Cycle 8 day 1 of Pemetrexed/Pembrolizumab  Interval History:  William Ali 34 y.o. male with medical history significant for metastatic adenocarcinoma of the lung who presents for a follow up visit.  The patient was last seen on 01/04/2020. In the interim he has had no hospitalizations or ED visits.  On exam today William Ali notes he has been declining since his last visit.  He reports that he had an episode when he was visiting [facility] where his legs basically gave out and he had to sit immediately.  He reports that he has had his "ups and downs".  He continues to have worsening pain in the chest, stomach, and right upper quadrant.  On exam today he notes that is predominantly substernal pain that he is feeling at this time.  He notes that yawning hurts and that sneezing is quite excruciatingly painful.  He also continues to have swelling around his neck, shoulders.  He endorses having pain in his teeth but is going to be seeing a dentist shortly.  He is no longer having any issues with cough.   On further discussion he reports that he forgot to take his fentanyl patch today.  He typically takes his oxycodone 10 mg more often than his 20 mg but occasionally takes both to try to help with the pain.  His wife reports that he "sleeps all day".  And spends about 12 hours in bed.  Except for eating he does not often sit up.  He is also had several issues with worsening shortness of breath requiring use of his albuterol inhaler.  He reports that his eye is clearing up per the [facility] ophthalmology group.  He has had no issues with nausea, vomiting, or diarrhea.  He denies having any fevers, chills, sweats.  He does occasionally have symptoms of the shortness  of breath but no cough.  He has been tolerating his Xarelto therapy well without any bleeding, or bruising.  A full 10 point ROS is listed below.  MEDICAL HISTORY:  Past Medical History:  Diagnosis Date  . Back pain    . Hypertension   . met lung ca to liver, spine, and brain dx'd 03/2019    SURGICAL HISTORY: Past Surgical History:  Procedure Laterality Date  . IR IMAGING GUIDED PORT INSERTION  06/12/2019  . KNEE SURGERY      SOCIAL HISTORY: Social History   Socioeconomic History  . Marital status: Married    Spouse name: Not on file  . Number of children: Not on file  . Years of education: Not on file  . Highest education level: Not on file  Occupational History  . Not on file  Tobacco Use  . Smoking status: Never Smoker  . Smokeless tobacco: Never Used  Vaping Use  . Vaping Use: Never used  Substance and Sexual Activity  . Alcohol use: No  . Drug use: No  . Sexual activity: Yes  Other Topics Concern  . Not on file  Social History Narrative  . Not on file   Social Determinants of Health   Financial Resource Strain: Not on file  Food Insecurity: Not on file  Transportation Needs: Not on file  Physical Activity: Not on file  Stress: Not on file  Social Connections: Not on file  Intimate Partner Violence: Not on file    FAMILY HISTORY: Family History  Problem Relation Age of Onset  . Hypertension Mother   . Diabetes Father     ALLERGIES:  is allergic to onion, other, peanut oil, and peanut-containing drug products.  MEDICATIONS:  Current Outpatient Medications  Medication Sig Dispense Refill  . albuterol (VENTOLIN HFA) 108 (90 Base) MCG/ACT inhaler TAKE 2 PUFFS BY MOUTH EVERY 6 HOURS AS NEEDED FOR WHEEZE OR SHORTNESS OF BREATH (Patient not taking: Reported on 11/14/2019) 6.7 g 1  . ALPRAZolam (XANAX) 1 MG tablet Take 1 tablet (1 mg total) by mouth every 4 (four) hours as needed for anxiety. 30 tablet 0  . celecoxib (CELEBREX) 100 MG capsule Take 1 capsule (100 mg total) by mouth 2 (two) times daily. 60 capsule 1  . dexamethasone (DECADRON) 1 MG tablet Take 1 tablet (1 mg total) by mouth daily. 60 tablet 1  . DULoxetine (CYMBALTA) 30 MG capsule Take 1 capsule (30 mg  total) by mouth daily. 60 capsule 3  . feeding supplement, ENSURE ENLIVE, (ENSURE ENLIVE) LIQD Take 237 mLs by mouth 2 (two) times daily between meals. (Patient not taking: Reported on 11/14/2019) 237 mL 12  . fentaNYL (DURAGESIC) 50 MCG/HR Place 1 patch onto the skin every other day. 15 patch 0  . folic acid (FOLVITE) 1 MG tablet Take 1 tablet (1 mg total) by mouth daily. 60 tablet 3  . ibuprofen (IBU) 800 MG tablet Take 1 tablet (800 mg total) by mouth in the morning and at bedtime. (Patient not taking: Reported on 11/02/2019) 30 tablet 0  . lidocaine-prilocaine (EMLA) cream Apply 1 application topically as needed. (Patient taking differently: Apply 1 application topically as needed (access port). ) 30 g 2  . magnesium oxide (MAG-OX) 400 (241.3 Mg) MG tablet Take 2 tablets (800 mg total) by mouth daily. 30 tablet 3  . metoprolol succinate (TOPROL XL) 50 MG 24 hr tablet Take 1 tablet (50 mg total) by mouth daily. Take with or immediately following a meal. 30  tablet 11  . ondansetron (ZOFRAN) 4 MG tablet Take 1 tablet (4 mg total) by mouth every 8 (eight) hours as needed for nausea or vomiting. 60 tablet 0  . Oxycodone HCl 20 MG TABS Take 1-2 tablets (20-40 mg total) by mouth every 6 (six) hours as needed (Breakthrough pain). 60 tablet 0  . pantoprazole (PROTONIX) 40 MG tablet Take 1 tablet (40 mg total) by mouth 2 (two) times daily. 60 tablet 3  . polyethylene glycol (MIRALAX / GLYCOLAX) 17 g packet Take 17 g by mouth daily as needed for moderate constipation. (Patient not taking: Reported on 11/14/2019) 14 each 0  . polyvinyl alcohol (LIQUIFILM TEARS) 1.4 % ophthalmic solution Place 1 drop into both eyes as needed for dry eyes. 15 mL 0  . rivaroxaban (XARELTO) 20 MG TABS tablet Take 1 tablet (20 mg total) by mouth daily with supper. 90 tablet 1  . senna-docusate (SENOKOT-S) 8.6-50 MG tablet Take 2 tablets by mouth at bedtime. (Patient not taking: Reported on 11/14/2019)    . traZODone (DESYREL) 100  MG tablet Take 1 tablet (100 mg total) by mouth at bedtime. Must have office visit for refills 30 tablet 0   No current facility-administered medications for this visit.   Facility-Administered Medications Ordered in Other Visits  Medication Dose Route Frequency Provider Last Rate Last Admin  . sodium chloride flush (NS) 0.9 % injection 10 mL  10 mL Intracatheter PRN Orson Slick, MD   10 mL at 01/04/20 1436    REVIEW OF SYSTEMS:   Constitutional: ( - ) fevers, ( - )  chills , ( - ) night sweats Eyes: ( - ) blurriness of vision, ( - ) double vision, ( - ) watery eyes Ears, nose, mouth, throat, and face: ( - ) mucositis, ( - ) sore throat Respiratory: ( - ) cough, ( - ) dyspnea, ( - ) wheezes Cardiovascular: ( - ) palpitation, ( - ) chest discomfort, ( - ) lower extremity swelling Gastrointestinal:  ( - ) nausea, ( - ) heartburn, ( - ) change in bowel habits Skin: ( - ) abnormal skin rashes Lymphatics: ( - ) new lymphadenopathy, ( - ) easy bruising Neurological: ( - ) numbness, ( - ) tingling, ( - ) new weaknesses Behavioral/Psych: ( - ) mood change, ( - ) new changes  All other systems were reviewed with the patient and are negative.  PHYSICAL EXAMINATION: ECOG PERFORMANCE STATUS: 2 - Symptomatic, <50% confined to bed  Vitals:   01/04/20 1153  BP: (!) 135/53  Pulse: (!) 107  Temp: 97.9 F (36.6 C)  SpO2: 100%   Filed Weights    GENERAL: well appearing young African American male in NAD  SKIN: skin color, texture, turgor are normal, no rashes or significant lesions NECK: supple, nontender LYMPH: fullness of the cervical lymph nodes bilaterally. No clear axillary lymph nodes. Stable from prior EYES: conjunctiva are pink and non-injected, sclera clear LUNGS: clear to auscultation and percussion with normal breathing effort HEART: regular rate & rhythm and no murmurs and no lower extremity edema Musculoskeletal: no cyanosis of digits and no clubbing. Increased swelling  inferior to axilla bilaterally. Also noted to have soft tissue swelling in left leg  PSYCH: alert & oriented x 3, fluent speech NEURO: no focal motor/sensory deficits  LABORATORY DATA:  I have reviewed the data as listed CBC Latest Ref Rng & Units 01/04/2020 12/14/2019 11/23/2019  WBC 4.0 - 10.5 K/uL 7.8 6.9 5.6  Hemoglobin  13.0 - 17.0 g/dL 9.5(L) 9.7(L) 10.3(L)  Hematocrit 39.0 - 52.0 % 31.8(L) 32.2(L) 33.6(L)  Platelets 150 - 400 K/uL 81(L) 85(L) 72(L)    CMP Latest Ref Rng & Units 01/04/2020 12/14/2019 11/23/2019  Glucose 70 - 99 mg/dL 122(H) 139(H) 94  BUN 6 - 20 mg/dL 23(H) 18 21(H)  Creatinine 0.61 - 1.24 mg/dL 1.18 1.12 0.98  Sodium 135 - 145 mmol/L 143 142 144  Potassium 3.5 - 5.1 mmol/L 4.1 3.7 3.8  Chloride 98 - 111 mmol/L 109 106 109  CO2 22 - 32 mmol/L _0 Calcium 8.9 - 10.3 mg/dL 9.3 9.0 9.1  Total Protein 6.5 - 8.1 g/dL 6.7 6.9 6.8  Total Bilirubin 0.3 - 1.2 mg/dL 1.7(H) 1.4(H) 0.7  Alkaline Phos 38 - 126 U/L 826(H) 788(H) 493(H)  AST 15 - 41 U/L 114(H) 99(H) 98(H)  ALT 0 - 44 U/L 136(H) 142(H) 234(H)    RADIOGRAPHIC STUDIES: No results found.  ASSESSMENT & PLAN Macy Isidoro Ali 34 y.o. male with medical history significant for metastatic adenocarcinoma of the lung who presents for a follow up visit prior to Cycle 9 of chemotherapy.   The patient successfully completed Cycle 1 of carboplatin and pemetrexed, started on 06/23/2019.  Unfortunately this had to be administered in the inpatient setting because the patient was admitted for pain control.  As we were unsure when he would be discharged we decided to move forward with the treatment at that time.  Pembrolizumab was not able to be administered in house due to logistical and insurance issues.  He received pembrolizumab during Cycle 2 which started on 07/13/2019.  On exam today William Ali notes he is having some decline.  He is spending more time in bed and is having difficulty controlling his pain.  He is also  had several bouts of shortness of breath.  Unfortunately he is spending little time up or in a chair.  Labs appear stable from prior though he does have an increase in alk phos and bilirubin.  Okay for proceeding with treatment today.  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 This will give [place] therapeutic options later in his treatment course. Previously I made it clear to William Ali that all treatments for administering are palliative in nature and designed to shrink the tumor and reduce symptoms.  The goal is to extend life and provide him some quality of life.  Once again we reiterated that there is no cure for his disease and that all steps moving forward should be made with this in mind.  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 Pathology could not definitively say, but he had a prior nodule on CXR and mass in lung appears to be the primary lesion -- Guardant 360 with actionable EGFR mutation noted.  --started Carbo/Pem/Pem (regimen detailed above) Cycle 1 Day 1 on 06/23/2019. Cycle 1 administered inpatient, pembrolizumab was not able to be added at that time. Cycle 2 (07/13/2019) included pembroliuzmab, which will be included in all subsequent cycles. He is now in maintenance phase with pemetrexed/pembrolizumab --repeat CT scan on 09/12/2019 showed interval response to therapy. His next scan is due now ( scheduled for 01/11/2020).  --LFTs at baseline with increase in the ALK phos. Continue to monitor  --pain control regimen as listed below   --RTC 01/25/2020 with  next cycle of chemotherapy.   #Facial Swelling, stable #Cervical Fullness/Lymphadenopathy, stable --bilateral facial swelling, most pronounced in the neck bilaterally. Some lid edema and facial fullness with no marked increase in  arm swelling --no worsening abdominal or LE swelling. No difficultly breathing or swallowing.  --on exam today the cervical regions bilaterally felt stable.   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 and radiation oncology are following, Appreciate assistance in management of brain mets.  --pain control as noted below.  #Pain Control, stable --pain is predominately in the back and abdomen --appreciate the assistance of palliative care in this patient's pain management --patient is taking dexamethasone 1 mg PO daily  -- fentanyl patches to 50 mcg QOD. Refill today  --oxycodone 20-28m q6H PRN --additionally is taking cymablata 355mPO and celebrex 20039maily. Trazadone for sleep --senna docusate/PRN miralax for opioid induced constipation  #Osseus Lesions/Hip Pain #Loose Teeth --pain control as above --encourage dental evaluation (has yet to be done) --continue IV zometa therapy for bone protection. Started 07/13/2019, to continue q 3 months. Delayed for dental evaluation which has been completed.   #Left Eye Vision Changes --[facility] --currently using eye patch over left eye.  --continue to monitor   # Weight Loss, stable --weight today could not be measured as he could not stand on the scale. Increases in his weight may represent fluid accumulation with steroids.  --encourage high calorie diet --possible increase appetite and weight from steroid therapy --continue to monitor  #Pulmonary Embolism --continue Xarelto 52m29m daily   Orders Placed This Encounter  Procedures  . Magnesium    Standing Status:   Future    Number of Occurrences:   1    Standing Expiration Date:   01/03/2021   All questions were answered. The patient knows to call  the clinic with any problems, questions or concerns.  A total of more than 30 minutes were spent on this encounter and over half of that time was spent on counseling and coordination of care as outlined above.   JohnLedell Peoples Department of Hematology/Oncology [facility]: [phone]: [phone]: [email or web address]  01/04/2020 4:53 PM

## 2020-01-04 NOTE — Progress Notes (Signed)
OK to treat with increased LFT's, low platelets and elevated heart rate per Dr. Lorenso Courier.   Patient discharged in stable condition with no complaints

## 2020-01-04 NOTE — Telephone Encounter (Signed)
Requested medication (s) are due for refill today: no discontinued 10/28/19  Requested medication (s) are on the active medication list: no  Last refill:  na  Future visit scheduled: no  Notes to clinic:  no security to refuse orders, not delegated per protocol. Request is for 75 mcg patch instead of 50 mcg patch.      Requested Prescriptions  Pending Prescriptions Disp Refills   fentaNYL ([place]) 75 MCG/HR [Pharmacy Med Name: fentaNYL 75 MCG/HR PT72 75 Patch] 5 patch 0    Sig: APPLY 1 PATCH ONTO SKIN EVERY 3 DAYS      Not Delegated - Analgesics:  Opioid Agonists Failed - 01/04/2020  4:55 PM      Failed - This refill cannot be delegated      Failed - Urine Drug Screen completed in last 360 days      Passed - Valid encounter within last 6 months    Recent Outpatient Visits           1 month ago    [place], MD   3 months ago No-show for appointment   [place], [place], NP   4 months ago Encounter to establish care   [place] Elsie Stain, MD

## 2020-01-04 NOTE — Patient Instructions (Signed)

## 2020-01-04 NOTE — Patient Instructions (Signed)
[facility] Discharge Instructions for Patients Receiving Chemotherapy  Today you received the following chemotherapy agents: Keytruda and Alimta  To help prevent nausea and vomiting after your treatment, we encourage you to take your nausea medication  as prescribed.    If you develop nausea and vomiting that is not controlled by your nausea medication, call the clinic.   BELOW ARE SYMPTOMS THAT SHOULD BE REPORTED IMMEDIATELY:  *FEVER GREATER THAN 100.5 F  *CHILLS WITH OR WITHOUT FEVER  NAUSEA AND VOMITING THAT IS NOT CONTROLLED WITH YOUR NAUSEA MEDICATION  *UNUSUAL SHORTNESS OF BREATH  *UNUSUAL BRUISING OR BLEEDING  TENDERNESS IN MOUTH AND THROAT WITH OR WITHOUT PRESENCE OF ULCERS  *URINARY PROBLEMS  *BOWEL PROBLEMS  UNUSUAL RASH Items with * indicate a potential emergency and should be followed up as soon as possible.  Feel free to call the clinic should you have any questions or concerns. The clinic phone number is [phone].  Please show the [place] at check-in to the Emergency Department and triage nurse.

## 2020-01-05 ENCOUNTER — Other Ambulatory Visit: Payer: Self-pay | Admitting: Critical Care Medicine

## 2020-01-05 ENCOUNTER — Encounter: Payer: Self-pay | Admitting: Internal Medicine

## 2020-01-05 LAB — T4: T4, Total: 11.1 ug/dL (ref 4.5–12.0)

## 2020-01-05 MED FILL — fentaNYL 75 MCG/HR PT72: 75 | 15 days supply | Qty: 5 | Fill #0

## 2020-01-05 NOTE — Telephone Encounter (Signed)
Dr. Lorenso Courier, This came in again but you have already refilled it. Please refuse. Gardiner Rhyme, RN

## 2020-01-05 NOTE — Telephone Encounter (Signed)
Another one for refusal.  Duplicate. Gardiner Rhyme, RN

## 2020-01-05 NOTE — Progress Notes (Unsigned)
Palliative Care Telephone Outreach  I have made several calls and messages to check on William Ali and his wife William Ali. I was unable to see them in infusion yesterday and reached out re: pain management and other palliative care needs.

## 2020-01-07 NOTE — Progress Notes (Signed)
  Radiation Oncology         [phone] ________________________________  Name: William Ali MRN: [id number]  Date: 11/15/2019  DOB: 08/13/1985  End of Treatment Note  Diagnosis:   bone metastasis     Indication for treatment::  palliative       Radiation treatment dates:   10/25/19 - 11/15/19  Site/dose:   1. The L-spine was treated to a dose of 37.5 Gy in 15 fractions using an isodose plan. This consisted of 2 fields.   Narrative: The patient tolerated radiation treatment relatively well.     Plan: The patient has completed radiation treatment. The patient will return to radiation oncology clinic for routine followup in one month. I advised the patient to call or return sooner if they have any questions or concerns related to their recovery or treatment. ________________________________  Jodelle Gross, M.D., Ph.D.

## 2020-01-11 ENCOUNTER — Ambulatory Visit (HOSPITAL_COMMUNITY): Admission: RE | Admit: 2020-01-11 | Payer: Medicaid Other | Source: Ambulatory Visit

## 2020-01-11 ENCOUNTER — Encounter: Payer: Self-pay | Admitting: Hematology and Oncology

## 2020-01-11 ENCOUNTER — Telehealth: Payer: Self-pay | Admitting: *Deleted

## 2020-01-11 NOTE — Telephone Encounter (Signed)
Received call from [facility] in Radiology stating that pt has called to cancel his scan for today as he felt he could not get out of the house d/t pain.  Communicated with [place] via [place] and on the phone. She states that William Ali had difficulty trying to drink all the contrast-started vomiting with the 2nd bottle.  Dr. Lorenso Courier made aware of the above.  Pt has an appt on 01/15/20 with Dr. Lorenso Courier. To re-evaluate scheduling scans on that day.

## 2020-01-15 ENCOUNTER — Inpatient Hospital Stay: Payer: Medicaid Other | Admitting: Hematology and Oncology

## 2020-01-15 ENCOUNTER — Other Ambulatory Visit: Payer: PRIVATE HEALTH INSURANCE

## 2020-01-15 ENCOUNTER — Telehealth: Payer: Self-pay | Admitting: *Deleted

## 2020-01-15 ENCOUNTER — Ambulatory Visit: Payer: PRIVATE HEALTH INSURANCE

## 2020-01-15 ENCOUNTER — Ambulatory Visit: Payer: PRIVATE HEALTH INSURANCE | Admitting: Hematology and Oncology

## 2020-01-15 NOTE — Telephone Encounter (Signed)
TCT patient and his wife to check on him. DJ had an appt today with Dr. Lorenso Courier @ 3pm and did not show. No answer on any of the available phone #'s. Left messages for either DJ or [place] to call back to re-schedule this appt.  Also left message that he has his CT scans re-scheduled for 01/29/19 @ 10:30 am.  Asked for pt or his wife to call back @ [phone]

## 2020-01-18 ENCOUNTER — Emergency Department (HOSPITAL_COMMUNITY): Payer: Medicaid Other

## 2020-01-18 ENCOUNTER — Emergency Department (HOSPITAL_COMMUNITY)
Admission: EM | Admit: 2020-01-18 | Discharge: 2020-01-27 | Disposition: E | Payer: Medicaid Other | Attending: Emergency Medicine | Admitting: Emergency Medicine

## 2020-01-18 ENCOUNTER — Encounter (HOSPITAL_COMMUNITY): Payer: Self-pay | Admitting: Emergency Medicine

## 2020-01-18 DIAGNOSIS — Z79899 Other long term (current) drug therapy: Secondary | ICD-10-CM | POA: Insufficient documentation

## 2020-01-18 DIAGNOSIS — I469 Cardiac arrest, cause unspecified: Secondary | ICD-10-CM | POA: Insufficient documentation

## 2020-01-18 DIAGNOSIS — I10 Essential (primary) hypertension: Secondary | ICD-10-CM | POA: Insufficient documentation

## 2020-01-18 DIAGNOSIS — R079 Chest pain, unspecified: Secondary | ICD-10-CM | POA: Diagnosis present

## 2020-01-18 DIAGNOSIS — C3490 Malignant neoplasm of unspecified part of unspecified bronchus or lung: Secondary | ICD-10-CM | POA: Diagnosis not present

## 2020-01-18 DIAGNOSIS — Z91010 Allergy to peanuts: Secondary | ICD-10-CM | POA: Insufficient documentation

## 2020-01-18 IMAGING — DX DG CHEST 1V PORT
1 series · 1 of 1 positions shown · non-contrast
Comparison: [DATE]

CLINICAL DATA: Chest pain.

EXAM:
PORTABLE CHEST 1 VIEW

[chest ap]
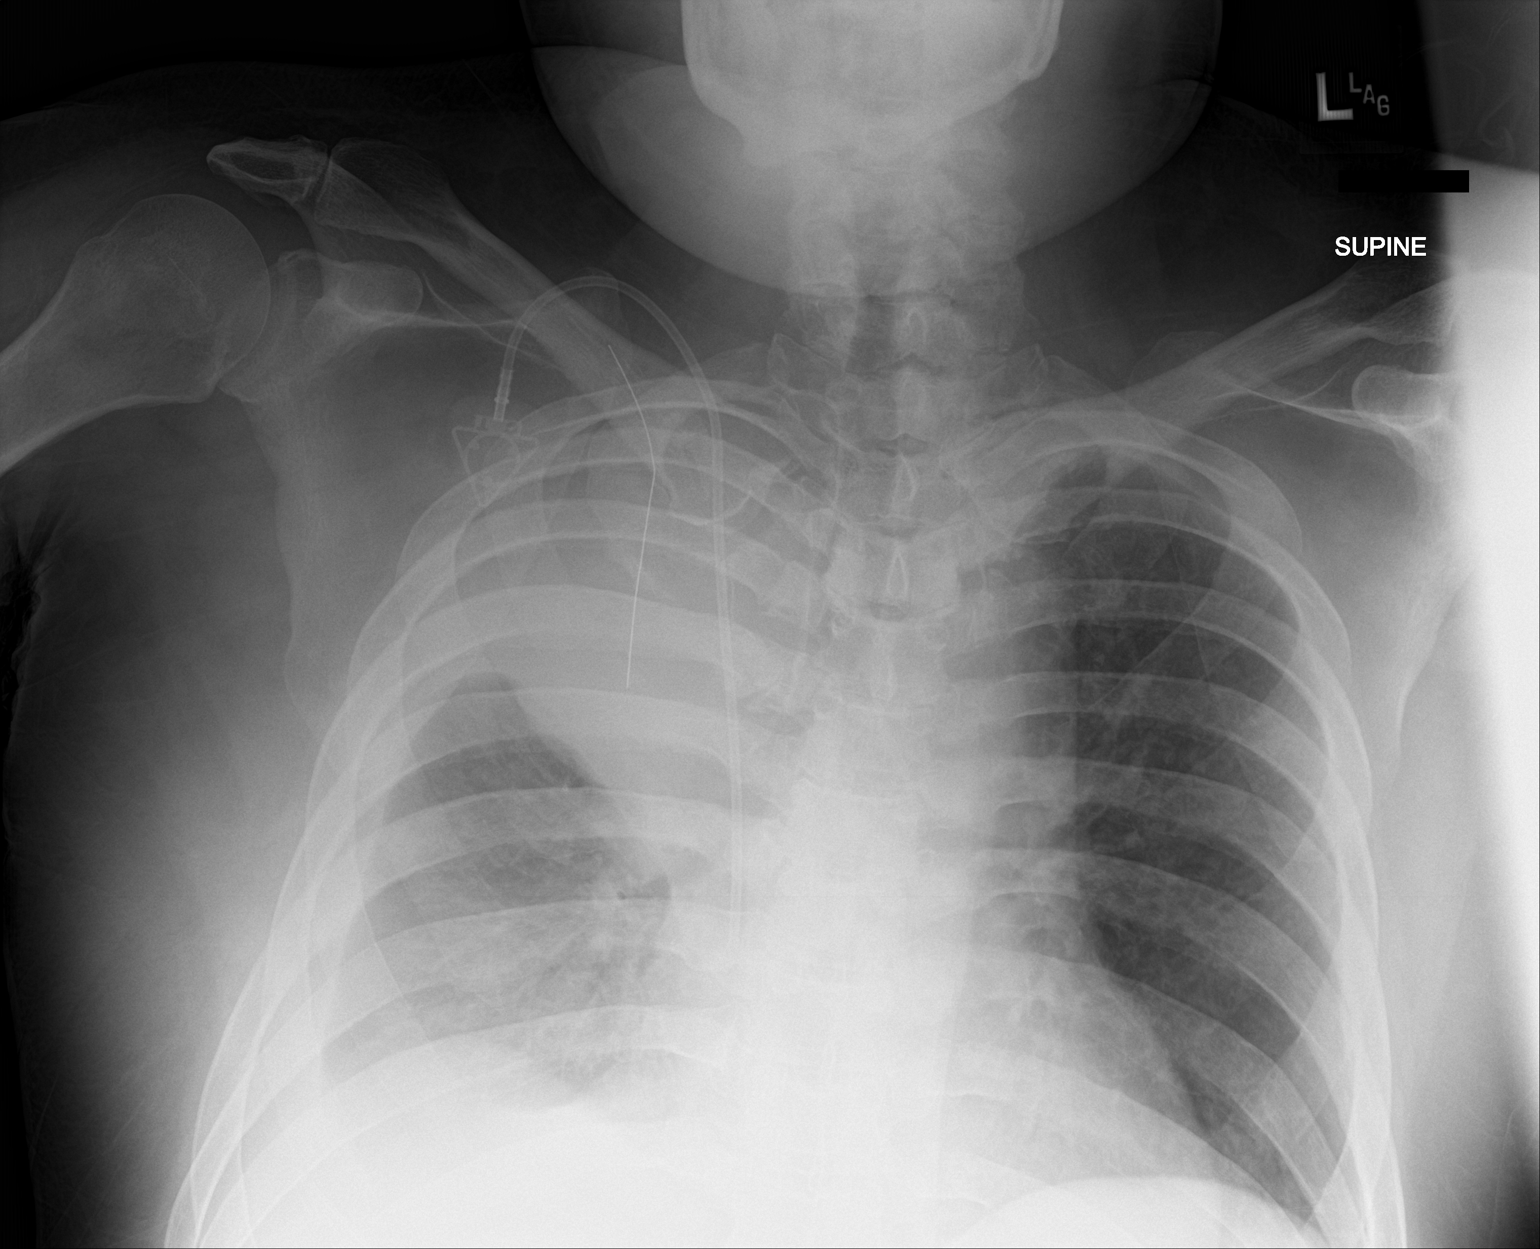

[1 of 1 positions shown; findings below may reference images not displayed]

FINDINGS: There is stable right-sided venous Port-A-Cath positioning.
Opacification of the upper right lung is seen. This is increased in
severity when compared to the prior study. A small to moderate size
right pleural effusion is also noted. This is a new finding when
compared to the prior exam. No pneumothorax is seen. The left lung
is clear. The heart size and mediastinal contours are within normal
limits. The visualized skeletal structures are unremarkable.
IMPRESSION: 1. Increased opacification of the upper right lobe since the prior
study, concerning for worsening adenopathy and/or underlying
neoplasm.
2. Small to moderate size right pleural effusion.

## 2020-01-18 MED ORDER — ATROPINE SULFATE 1 MG/ML IJ SOLN
INTRAMUSCULAR | Status: AC | PRN
  Administered 2020-01-18: 1 mg via INTRAVENOUS

## 2020-01-18 MED ORDER — LACTATED RINGERS IV BOLUS: 1000.0000 mL | Freq: Once | INTRAVENOUS | Status: DC

## 2020-01-18 MED ORDER — EPINEPHRINE 1 MG/10ML IJ SOSY
PREFILLED_SYRINGE | INTRAMUSCULAR | Status: AC | PRN
  Administered 2020-01-18 (×3): 1 mg via INTRAVENOUS

## 2020-01-18 MED ORDER — FENTANYL CITRATE (PF) 100 MCG/2ML IJ SOLN
75.0000 ug | Freq: Once | INTRAMUSCULAR | Status: DC
  Filled 2020-01-18: qty 2

## 2020-01-18 MED ORDER — SODIUM CHLORIDE 0.9 % IV BOLUS
1000.0000 mL | Freq: Once | INTRAVENOUS | Status: DC
Start: 2020-01-18 — End: 2020-01-19

## 2020-01-18 NOTE — ED Provider Notes (Signed)
IO LINE INSERTION Performed by: Lorin Glass, PA-C Authorized by: Lorin Glass, PA-C   Consent:    Consent obtained:  Emergent situation   Risks, benefits, and alternatives were discussed: not applicable   Universal protocol:    Patient identity confirmed:  Anonymous protocol, patient vented/unresponsive Anesthesia:    Anesthesia method:  None Procedure details:    Insertion site:  L proximal tibia   Insertion device:  Drill device   Insertion: Needle was inserted through the bony cortex     Number of attempts:  1   Insertion confirmation:  Aspiration of blood/marrow, easy infusion of fluids and stability of the needle Post-procedure details:    Secured with:  Transparent dressing   Procedure completion:  Mendel Corning, PA-C 01/16/2020 2341    [place], Jenny Reichmann, MD 02/06/20 [phone]

## 2020-01-18 NOTE — Code Documentation (Signed)
Patient time of death occurred at Apr 29, 2256.

## 2020-01-18 NOTE — ED Triage Notes (Signed)
Patient here from home with all over body pain, nausea, fatigue, and loss of appetite x3 days. Cancer patient with active chemo.

## 2020-01-18 NOTE — ED Provider Notes (Signed)
CPR  Date/Time: 01/09/2020 10:40 PM Performed by: Calle Schader, MD Authorized by: Dwan Fennel, MD  CPR Procedure Details:      Amount of time prior to administration of ACLS/BLS (minutes):  0   ACLS/BLS initiated by EMS: No     CPR/ACLS performed in the ED: Yes     Duration of CPR (minutes):  18   Outcome: Pt declared dead    CPR performed via ACLS guidelines under my direct supervision.  See RN documentation for details including defibrillator use, medications, doses and timing. Procedure Name: Intubation Date/Time: 01/22/2020 10:45 PM Performed by: Cletis Muma, MD Pre-anesthesia Checklist: Patient being monitored and Suction available Oxygen Delivery Method: Ambu bag Preoxygenation: Pre-oxygenation with 100% oxygen Ventilation: Mask ventilation without difficulty Laryngoscope Size: Glidescope and 5 Tube size: 8.0 mm Number of attempts: 1 Airway Equipment and Method: Video-laryngoscopy Placement Confirmation: ETT inserted through vocal cords under direct vision,  CO2 detector and Breath sounds checked- equal and bilateral Tube secured with: ETT holder Dental Injury: Teeth and Oropharynx as per pre-operative assessment       Patient had already been seen by Joline Maxcy, PA-C.  At her instruction staff was getting patient on the monitor.  The patient unexpectedly went into V. fib arrest and I was called to the room and arrived with PA McDonald.  CPR, intubation performed as described above.  Please see nursing notes for medications administered.   Magdalyn Arenivas, Jenny Reichmann, MD 27-Jan-2020 [phone]

## 2020-01-18 NOTE — ED Provider Notes (Signed)
[place] DEPT Provider Note   CSN: [id number] Arrival date & time: 01/10/2020  2153     History Chief Complaint  Patient presents with  . nausea  . Fatigue  . Pain     William Ali is a 34 y.o. 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  The patient reports central chest pain for the last 3 days in the setting of three days of constant, worsening shortness of breath. Worse with laying flat. He has had a slight cough. No leg swelling or fevers.   He cannot tell me if he has been taking any pain medicine.  The remainder of the history is provided by the patient's wife.  She reports that the patient has had chest and abdominal pain over the last few days.  He has had worsening shortness of breath and difficulty breathing with even minimal movement.  He has had no appetite and has been complaining of muscle cramps and body aches.  She reports that he has been compliant with all of his home medications.  Earlier today, she found a pair of his underwear in the bathroom floor that were soaked with red-colored urine.  Later in the day, she went to check on him in his room and the patient had voided in the trash. He told he wife he was unable to make it to the restroom in time. His wife noted that the urine was a dark brown.   His wife reports that he has had worsening confusion since yesterday. At baseline, he is alert and oriented x4.   He has not had any vomiting since last week when he vomited the oral contrast prior to a scheduled CT of his abdomen pelvis and the scan was canceled.  EMS reports that the patient maintained an oxygen saturation of 88 to 94% on room air in route.  He was afebrile.  Normotensive.  He lives at home.  She states that he  really has not gotten out of bed since Thanksgiving.  She is unsure if this is related to the chemo or to pain, but notes that he has not "bounced back" as well from his last hospitalization.  She is his primary caregiver.  They do not have home health or home health care aide.  She notes that she recently went back to work and the patient is at their home by himself most of the time alone.   Level 5 caveat secondary to altered mental status.  The history is provided by the patient and medical records. No language interpreter was used.       Past Medical History:  Diagnosis Date  . Back pain   . Hypertension   . 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 Chest pain   . Generalized abdominal pain   . Prolonged Q-T interval on ECG 10/14/2019  . Hypomagnesemia 10/14/2019  . Cancer related pain 10/14/2019  . Tachycardia 10/12/2019  . Colitis 10/12/2019  . Palliative care encounter   . Metastatic primary lung cancer ([place]) 06/17/2019  . 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 Pulmonary embolism ([place]) 06/13/2019  . DVT (deep venous thrombosis) ([place]) 06/13/2019  . Goals  of care, counseling/discussion 05/21/2019  . Metastatic adenocarcinoma to brain ([place]) 05/21/2019  . Adenocarcinoma of lung ([place]) 05/21/2019  . Essential hypertension 08/17/2018  . Gastroesophageal reflux disease without esophagitis 08/17/2018    Past Surgical History:  Procedure Laterality Date  . IR IMAGING GUIDED PORT INSERTION  06/12/2019  . KNEE SURGERY         Family History  Problem Relation Age of Onset  . Hypertension Mother   . Diabetes Father     Social History   Tobacco Use  . Smoking status: Never Smoker  . Smokeless tobacco: Never Used  Vaping Use  . Vaping Use: Never used  Substance Use Topics  . Alcohol use: No  . Drug use: No    Home Medications Prior to Admission medications   Medication Sig  Start Date End Date Taking? Authorizing Provider  albuterol (VENTOLIN HFA) 108 (90 Base) MCG/ACT inhaler TAKE 2 PUFFS BY MOUTH EVERY 6 HOURS AS NEEDED FOR WHEEZE OR SHORTNESS OF BREATH Patient not taking: Reported on 11/14/2019 06/29/19   Orson Slick, MD  ALPRAZolam Duanne Moron) 1 MG tablet Take 1 tablet (1 mg total) by mouth every 4 (four) hours as needed for anxiety. 10/28/19   Annita Brod, MD  celecoxib (CELEBREX) 100 MG capsule Take 1 capsule (100 mg total) by mouth 2 (two) times daily. 10/31/19   Elsie Stain, MD  dexamethasone (DECADRON) 1 MG tablet Take 1 tablet (1 mg total) by mouth daily. 12/26/19   [name], MD  DULoxetine (CYMBALTA) 30 MG capsule Take 1 capsule (30 mg total) by mouth daily. 01/04/20   Orson Slick, MD  feeding supplement, ENSURE ENLIVE, (ENSURE ENLIVE) LIQD Take 237 mLs by mouth 2 (two) times daily between meals. Patient not taking: Reported on 11/14/2019 10/28/19   Annita Brod, MD  fentaNYL (DURAGESIC) 50 MCG/HR Place 1 patch onto the skin every other day. 01/03/20   Orson Slick, MD  fentaNYL (DURAGESIC) 75 MCG/HR APPLY 1 PATCH ONTO SKIN EVERY 3 DAYS 01/05/20   Elsie Stain, MD  folic acid (FOLVITE) 1 MG tablet Take 1 tablet (1 mg total) by mouth daily. 06/29/19   Orson Slick, MD  ibuprofen (IBU) 800 MG tablet Take 1 tablet (800 mg total) by mouth in the morning and at bedtime. Patient not taking: Reported on 11/02/2019 10/28/19   Annita Brod, MD  lidocaine-prilocaine (EMLA) cream Apply 1 application topically as needed. Patient taking differently: Apply 1 application topically as needed (access port).  07/08/19   Orson Slick, MD  magnesium oxide (MAG-OX) 400 (241.3 Mg) MG tablet Take 2 tablets (800 mg total) by mouth daily. 10/29/19   Annita Brod, MD  metoprolol succinate (TOPROL XL) 50 MG 24 hr tablet Take 1 tablet (50 mg total) by mouth daily. Take with or immediately following a meal. 08/23/19 08/22/20  Elsie Stain, MD  ondansetron (ZOFRAN) 4 MG tablet Take 1 tablet (4 mg total) by mouth every 8 (eight) hours as needed for nausea or vomiting. 11/08/19   Elsie Stain, MD  Oxycodone HCl 20 MG TABS Take 1-2 tablets (20-40 mg total) by mouth every 6 (six) hours as needed (Breakthrough pain). 11/01/19   Acquanetta Chain, DO  pantoprazole (PROTONIX) 40 MG tablet Take 1 tablet (40 mg total) by mouth 2 (two) times daily. 10/28/19   Annita Brod, MD  polyethylene glycol (MIRALAX / GLYCOLAX) 17 g packet Take 17  g by mouth daily as needed for moderate constipation. Patient not taking: Reported on 11/14/2019 10/28/19   Annita Brod, MD  polyvinyl alcohol (LIQUIFILM TEARS) 1.4 % ophthalmic solution Place 1 drop into both eyes as needed for dry eyes. 10/28/19   Annita Brod, MD  rivaroxaban (XARELTO) 20 MG TABS tablet Take 1 tablet (20 mg total) by mouth daily with supper. 09/08/19   Orson Slick, MD  senna-docusate (SENOKOT-S) 8.6-50 MG tablet Take 2 tablets by mouth at bedtime. Patient not taking: Reported on 11/14/2019 10/28/19   Annita Brod, MD  traZODone (DESYREL) 100 MG tablet Take 1 tablet (100 mg total) by mouth at bedtime. Must have office visit for refills 01/01/20   Elsie Stain, MD  amLODipine (NORVASC) 5 MG tablet Take 1 tablet (5 mg total) by mouth daily. 04/21/18 08/04/19  Robyn Haber, MD  famotidine (PEPCID) 20 MG tablet Take 1 tablet (20 mg total) by mouth 2 (two) times daily. Patient taking differently: Take 20 mg by mouth 2 (two) times daily as needed for heartburn.  04/21/18 07/21/18  Robyn Haber, MD    Allergies    Onion, Other, Peanut oil, and Peanut-containing drug products  Review of Systems   Review of Systems  Unable to perform ROS: Mental status change  Constitutional: Positive for fatigue. Negative for fever.  Respiratory: Positive for cough and shortness of breath.   Cardiovascular: Positive for chest pain.  Gastrointestinal: Positive  for abdominal pain. Negative for vomiting.  Genitourinary: Positive for decreased urine volume, difficulty urinating and hematuria.  Allergic/Immunologic: Positive for immunocompromised state.  Neurological: Positive for headaches.  Psychiatric/Behavioral: Positive for confusion.    Physical Exam Updated Vital Signs BP 119/60 (BP Location: Right Arm)   Pulse (!) 146   Temp 98.9 F (37.2 C) (Oral)   Resp (!) 26   SpO2 (!) 89%   Physical Exam Vitals and nursing note reviewed.  Constitutional:      General: He is in acute distress.     Appearance: He is well-developed.     Comments: Writhing around on the bed and asking for the bed to be repositioned and will intermittently find a comfortable position. Holding his thighs and calves and endorsing severe cramps.   HENT:     Head: Normocephalic.  Eyes:     Extraocular Movements: Extraocular movements intact.     Conjunctiva/sclera: Conjunctivae normal.     Pupils: Pupils are equal, round, and reactive to light.  Cardiovascular:     Rate and Rhythm: Regular rhythm. Tachycardia present.     Pulses: Normal pulses.     Heart sounds: Normal heart sounds. No murmur heard. No friction rub. No gallop.      Comments: No murmurs rubs or gallops. Pulmonary:     Effort: Pulmonary effort is normal.     Comments: Able to speak in complete, fluent sentences.  He is mildly tachypneic.  No accessory muscle use, nasal flaring, retractions.  Clear and equal breath sounds in all fields. Abdominal:     General: There is no distension.     Palpations: Abdomen is soft.     Tenderness: There is abdominal tenderness.     Comments: Diffusely tender to palpation throughout the abdomen with moderate distention.  Abdomen is firm, but not hard.  Musculoskeletal:     Cervical back: Neck supple.     Right lower leg: No edema.     Left lower leg: No edema.     Comments:  No palpable spasms in the bilateral lower extremities  Skin:    General: Skin is warm  and dry.     Coloration: Skin is pale.  Neurological:     Mental Status: He is alert.     Comments: Patient is able to state his first and last name.  He knows that he is at a hospital.  He knows he is in [facility].  When asked the year, he initially says 2014, but after thinking about it for several seconds he is able to state that it is 2021.  When asked about the month, he initially states July.  After several seconds, he states "it's the month of Christmas" and is unable to state that it is December. Moves all 4 extremities spontaneously.  Psychiatric:        Behavior: Behavior normal.     ED Results / Procedures / Treatments   Labs (all labs ordered are listed, but only abnormal results are displayed) Labs Reviewed  CBC  COMPREHENSIVE METABOLIC PANEL  URINALYSIS, ROUTINE W REFLEX MICROSCOPIC  MAGNESIUM  AMMONIA  CBG MONITORING, ED  TROPONIN I (HIGH SENSITIVITY)    EKG None  Radiology DG Chest Portable 1 View  Result Date: 01/13/2020 CLINICAL DATA:  Chest pain. EXAM: PORTABLE CHEST 1 VIEW COMPARISON:  October 19, 2019 FINDINGS: There is stable right-sided venous Port-A-Cath positioning. Opacification of the upper right lung is seen. This is increased in severity when compared to the prior study. A small to moderate size right pleural effusion is also noted. This is a new finding when compared to the prior exam. No pneumothorax is seen. The left lung is clear. The heart size and mediastinal contours are within normal limits. The visualized skeletal structures are unremarkable. IMPRESSION: 1. Increased opacification of the upper right lobe since the prior study, concerning for worsening adenopathy and/or underlying neoplasm. 2. Small to moderate size right pleural effusion. Electronically Signed   By: Virgina Norfolk M.D.   On: 01/12/2020 22:41    Procedures .Critical Care Performed by: Joanne Gavel, PA-C Authorized by: Joanne Gavel, PA-C   Critical  care provider statement:    Critical care time (minutes):  35   Critical care start time:  01/21/2020 10:05 PM   Critical care end time:  01/11/2020 10:40 PM   Critical care time was exclusive of:  Separately billable procedures and treating other patients and teaching time   Critical care was necessary to treat or prevent imminent or life-threatening deterioration of the following conditions:  Cardiac failure and respiratory failure   Critical care was time spent personally by me on the following activities:  Ordering and performing treatments and interventions, ordering and review of laboratory studies, ordering and review of radiographic studies, pulse oximetry, re-evaluation of patient's condition, review of old charts, obtaining history from patient or surrogate, examination of patient, evaluation of patient's response to treatment and development of treatment plan with patient or surrogate   I assumed direction of critical care for this patient from another provider in my specialty: no     (including critical care time)  Medications Ordered in ED Medications  sodium chloride 0.9 % bolus 1,000 mL (has no administration in time range)  fentaNYL (SUBLIMAZE) injection 75 mcg (has no administration in time range)  lactated ringers bolus 1,000 mL (has no administration in time range)  EPINEPHrine (ADRENALIN) 1 MG/10ML injection (1 mg Intravenous Given 12/31/2019 2255)  atropine injection (1 mg Intravenous Given 01/09/2020 2252)  ED Course  I have reviewed the triage vital signs and the nursing notes.  Pertinent labs & imaging results that were available during my care of the patient were reviewed by me and considered in my medical decision making (see chart for details).    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  He is hypoxic at 89% on room air.  EMS noted oxygen saturation fluctuated between 89 and 94% in route.  Tachycardic in the 140s.  He has mildly tachypneic.  Normotensive.  The patient was seen and evaluated upon arrival while EMS was present.  EKG, chest x-ray, and labs were ordered immediately after initially evaluating the patient.  After obtaining history from EMS and the patient, located the patient's nurse and requested an update on what interventions would be started.  Located a tech and requested that the patient be placed on the cardiac monitor.   I then went and contacted the patient's wife for collateral information. After speaking with the patient's wife, I returned to the room and the patient was not yet on the cardiac monitor.  I then located another nurse and tech who were able to promptly go to the patient's room to start orders.  At 22:40, a CODE BLUE was initiated and Dr. Florina Ou immediately joined me at bedside along with Wyn Quaker, PA-C and Dr. Trish Mage. Please see Dr. Florina Ou' note for further information.   The patient's wife was comforted by myself and Dr. Florina Ou. She stated that the patient's wishes were to remain a full code.    Despite aggressive resuscitation measures, time of death called at 22:58.   Message sent to his his PCP, Dr. Asencion Noble and oncologist, Dr. Narda Rutherford IV, requesting them to send the disc certificate.  Final Clinical Impression(s) / ED Diagnoses Final diagnoses:  Death due to cardiac arrest [facility])  Primary malignant neoplasm of lung metastatic to other site, unspecified laterality [facility])    Rx / DC Orders ED Discharge Orders    None       Joanne Gavel, PA-C 01/04/2020 2349    Molpus, Jenny Reichmann, MD 2020/01/22 [id number]

## 2020-01-19 MED ORDER — SODIUM BICARBONATE 8.4 % IV SOLN
INTRAVENOUS | Status: AC | PRN
  Administered 2020-01-18: 100 meq via INTRAVENOUS

## 2020-01-19 MED FILL — Medication: Qty: 1 | Status: AC

## 2020-01-25 ENCOUNTER — Inpatient Hospital Stay: Payer: Medicaid Other

## 2020-01-27 NOTE — ED Notes (Signed)
Went into patients room to put him on the monitor and do EKG while talking to patient he started having snoring respirations, I instructed the nursing tech to push the code blue button and I immediately started CPR

## 2020-01-27 NOTE — Code Documentation (Signed)
Bedside US completed by Molpus, MD. No cardiac activity noted.

## 2020-01-27 DEATH — deceased

## 2020-01-29 ENCOUNTER — Ambulatory Visit (HOSPITAL_COMMUNITY): Payer: Medicaid Other

## 2020-02-15 ENCOUNTER — Ambulatory Visit: Payer: Medicaid Other

## 2020-02-15 ENCOUNTER — Other Ambulatory Visit: Payer: Medicaid Other

## 2020-02-15 ENCOUNTER — Ambulatory Visit: Payer: Medicaid Other | Admitting: Hematology and Oncology

## 2020-02-16 ENCOUNTER — Inpatient Hospital Stay: Admission: RE | Admit: 2020-02-16 | Payer: PRIVATE HEALTH INSURANCE | Source: Ambulatory Visit

## 2020-02-20 ENCOUNTER — Ambulatory Visit: Payer: PRIVATE HEALTH INSURANCE | Admitting: Internal Medicine

## 2020-05-03 ENCOUNTER — Other Ambulatory Visit (HOSPITAL_COMMUNITY): Payer: Self-pay

## 2020-05-28 ENCOUNTER — Telehealth: Payer: Self-pay | Admitting: *Deleted

## 2020-05-28 NOTE — Telephone Encounter (Signed)
Received call from [facility] from [employer] [facility] requesting information related to pt's Keytruda treatment for lung cancer and his subsequent death. Fax'd records to her @ [phone]
